# Patient Record
Sex: Female | Born: 1935 | Race: White | Hispanic: No | Smoking: Former smoker
Health system: Southern US, Community
[De-identification: ages and names within clinical notes are randomized; demographics above are authoritative.]

## PROBLEM LIST (undated history)

## (undated) DIAGNOSIS — Z9981 Dependence on supplemental oxygen: Secondary | ICD-10-CM

## (undated) DIAGNOSIS — I219 Acute myocardial infarction, unspecified: Secondary | ICD-10-CM

## (undated) DIAGNOSIS — R05 Cough: Secondary | ICD-10-CM

## (undated) DIAGNOSIS — F329 Major depressive disorder, single episode, unspecified: Secondary | ICD-10-CM

## (undated) DIAGNOSIS — I509 Heart failure, unspecified: Secondary | ICD-10-CM

## (undated) DIAGNOSIS — I2510 Atherosclerotic heart disease of native coronary artery without angina pectoris: Secondary | ICD-10-CM

## (undated) DIAGNOSIS — R011 Cardiac murmur, unspecified: Secondary | ICD-10-CM

## (undated) DIAGNOSIS — M4800 Spinal stenosis, site unspecified: Secondary | ICD-10-CM

## (undated) DIAGNOSIS — F3289 Other specified depressive episodes: Secondary | ICD-10-CM

## (undated) DIAGNOSIS — J4489 Other specified chronic obstructive pulmonary disease: Secondary | ICD-10-CM

## (undated) DIAGNOSIS — M810 Age-related osteoporosis without current pathological fracture: Secondary | ICD-10-CM

## (undated) DIAGNOSIS — R0989 Other specified symptoms and signs involving the circulatory and respiratory systems: Secondary | ICD-10-CM

## (undated) DIAGNOSIS — M1990 Unspecified osteoarthritis, unspecified site: Secondary | ICD-10-CM

## (undated) DIAGNOSIS — I209 Angina pectoris, unspecified: Secondary | ICD-10-CM

## (undated) DIAGNOSIS — R0609 Other forms of dyspnea: Secondary | ICD-10-CM

## (undated) DIAGNOSIS — T464X5A Adverse effect of angiotensin-converting-enzyme inhibitors, initial encounter: Secondary | ICD-10-CM

## (undated) DIAGNOSIS — Z853 Personal history of malignant neoplasm of breast: Secondary | ICD-10-CM

## (undated) DIAGNOSIS — J189 Pneumonia, unspecified organism: Secondary | ICD-10-CM

## (undated) DIAGNOSIS — H9190 Unspecified hearing loss, unspecified ear: Secondary | ICD-10-CM

## (undated) DIAGNOSIS — F419 Anxiety disorder, unspecified: Secondary | ICD-10-CM

## (undated) DIAGNOSIS — R32 Unspecified urinary incontinence: Secondary | ICD-10-CM

## (undated) DIAGNOSIS — J309 Allergic rhinitis, unspecified: Secondary | ICD-10-CM

## (undated) DIAGNOSIS — J449 Chronic obstructive pulmonary disease, unspecified: Secondary | ICD-10-CM

## (undated) DIAGNOSIS — M109 Gout, unspecified: Secondary | ICD-10-CM

## (undated) DIAGNOSIS — R058 Other specified cough: Secondary | ICD-10-CM

## (undated) DIAGNOSIS — I739 Peripheral vascular disease, unspecified: Secondary | ICD-10-CM

## (undated) DIAGNOSIS — I6529 Occlusion and stenosis of unspecified carotid artery: Secondary | ICD-10-CM

## (undated) DIAGNOSIS — E785 Hyperlipidemia, unspecified: Secondary | ICD-10-CM

## (undated) DIAGNOSIS — K219 Gastro-esophageal reflux disease without esophagitis: Secondary | ICD-10-CM

## (undated) DIAGNOSIS — I10 Essential (primary) hypertension: Secondary | ICD-10-CM

## (undated) DIAGNOSIS — C50919 Malignant neoplasm of unspecified site of unspecified female breast: Secondary | ICD-10-CM

## (undated) DIAGNOSIS — S22080A Wedge compression fracture of T11-T12 vertebra, initial encounter for closed fracture: Secondary | ICD-10-CM

## (undated) DIAGNOSIS — G709 Myoneural disorder, unspecified: Secondary | ICD-10-CM

## (undated) DIAGNOSIS — J069 Acute upper respiratory infection, unspecified: Secondary | ICD-10-CM

## (undated) DIAGNOSIS — R0602 Shortness of breath: Secondary | ICD-10-CM

## (undated) HISTORY — DX: Spinal stenosis, site unspecified: M48.00

## (undated) HISTORY — DX: Personal history of malignant neoplasm of breast: Z85.3

## (undated) HISTORY — DX: Unspecified urinary incontinence: R32

## (undated) HISTORY — DX: Essential (primary) hypertension: I10

## (undated) HISTORY — DX: Major depressive disorder, single episode, unspecified: F32.9

## (undated) HISTORY — DX: Allergic rhinitis, unspecified: J30.9

## (undated) HISTORY — DX: Other specified depressive episodes: F32.89

## (undated) HISTORY — DX: Adverse effect of angiotensin-converting-enzyme inhibitors, initial encounter: T46.4X5A

## (undated) HISTORY — PX: FRACTURE SURGERY: SHX138

## (undated) HISTORY — PX: ABDOMINAL HYSTERECTOMY: SHX81

## (undated) HISTORY — DX: Peripheral vascular disease, unspecified: I73.9

## (undated) HISTORY — DX: Other forms of dyspnea: R06.09

## (undated) HISTORY — DX: Age-related osteoporosis without current pathological fracture: M81.0

## (undated) HISTORY — DX: Other specified symptoms and signs involving the circulatory and respiratory systems: R09.89

## (undated) HISTORY — PX: MASTOIDECTOMY: SHX711

## (undated) HISTORY — PX: MASTECTOMY: SHX3

## (undated) HISTORY — DX: Cough: R05

## (undated) HISTORY — DX: Gastro-esophageal reflux disease without esophagitis: K21.9

## (undated) HISTORY — DX: Chronic obstructive pulmonary disease, unspecified: J44.9

## (undated) HISTORY — DX: Wedge compression fracture of t11-T12 vertebra, initial encounter for closed fracture: S22.080A

## (undated) HISTORY — DX: Hyperlipidemia, unspecified: E78.5

## (undated) HISTORY — DX: Other specified cough: R05.8

## (undated) HISTORY — DX: Unspecified osteoarthritis, unspecified site: M19.90

## (undated) HISTORY — DX: Atherosclerotic heart disease of native coronary artery without angina pectoris: I25.10

## (undated) HISTORY — DX: Occlusion and stenosis of unspecified carotid artery: I65.29

## (undated) HISTORY — DX: Malignant neoplasm of unspecified site of unspecified female breast: C50.919

## (undated) HISTORY — DX: Other specified chronic obstructive pulmonary disease: J44.89

## (undated) HISTORY — DX: Unspecified hearing loss, unspecified ear: H91.90

## (undated) HISTORY — DX: Gout, unspecified: M10.9

---
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
## 1985-07-13 HISTORY — PX: MASTECTOMY: SHX3

## 1996-07-13 HISTORY — PX: REDUCTION MAMMAPLASTY: SUR839

## 1996-07-13 HISTORY — PX: BREAST RECONSTRUCTION: SHX9

## 1997-10-27 ENCOUNTER — Emergency Department (HOSPITAL_COMMUNITY): Admission: EM | Admit: 1997-10-27 | Discharge: 1997-10-27 | Payer: Self-pay | Admitting: Emergency Medicine

## 1998-04-16 ENCOUNTER — Ambulatory Visit (HOSPITAL_COMMUNITY): Admission: RE | Admit: 1998-04-16 | Discharge: 1998-04-16 | Payer: Self-pay | Admitting: Internal Medicine

## 1998-04-22 ENCOUNTER — Emergency Department (HOSPITAL_COMMUNITY): Admission: EM | Admit: 1998-04-22 | Discharge: 1998-04-22 | Payer: Self-pay | Admitting: Emergency Medicine

## 1999-03-05 ENCOUNTER — Emergency Department (HOSPITAL_COMMUNITY): Admission: EM | Admit: 1999-03-05 | Discharge: 1999-03-05 | Payer: Self-pay | Admitting: Emergency Medicine

## 1999-03-05 ENCOUNTER — Encounter: Payer: Self-pay | Admitting: Emergency Medicine

## 1999-06-18 ENCOUNTER — Ambulatory Visit (HOSPITAL_COMMUNITY): Admission: RE | Admit: 1999-06-18 | Discharge: 1999-06-18 | Payer: Self-pay | Admitting: Gastroenterology

## 1999-12-07 ENCOUNTER — Emergency Department (HOSPITAL_COMMUNITY): Admission: EM | Admit: 1999-12-07 | Discharge: 1999-12-07 | Payer: Self-pay | Admitting: *Deleted

## 1999-12-07 ENCOUNTER — Encounter: Payer: Self-pay | Admitting: Emergency Medicine

## 2000-12-26 ENCOUNTER — Encounter: Payer: Self-pay | Admitting: Emergency Medicine

## 2000-12-26 ENCOUNTER — Emergency Department (HOSPITAL_COMMUNITY): Admission: EM | Admit: 2000-12-26 | Discharge: 2000-12-26 | Payer: Self-pay | Admitting: Emergency Medicine

## 2001-04-27 ENCOUNTER — Encounter: Payer: Self-pay | Admitting: Emergency Medicine

## 2001-04-27 ENCOUNTER — Emergency Department (HOSPITAL_COMMUNITY): Admission: EM | Admit: 2001-04-27 | Discharge: 2001-04-27 | Payer: Self-pay | Admitting: Emergency Medicine

## 2001-05-28 ENCOUNTER — Encounter: Payer: Self-pay | Admitting: Orthopedic Surgery

## 2001-05-29 ENCOUNTER — Inpatient Hospital Stay (HOSPITAL_COMMUNITY): Admission: RE | Admit: 2001-05-29 | Discharge: 2001-05-30 | Payer: Self-pay | Admitting: Orthopedic Surgery

## 2001-07-13 HISTORY — PX: LUMBAR LAMINECTOMY: SHX95

## 2001-10-11 ENCOUNTER — Ambulatory Visit (HOSPITAL_COMMUNITY): Admission: RE | Admit: 2001-10-11 | Discharge: 2001-10-11 | Payer: Self-pay | Admitting: Orthopedic Surgery

## 2002-01-10 ENCOUNTER — Inpatient Hospital Stay (HOSPITAL_COMMUNITY): Admission: RE | Admit: 2002-01-10 | Discharge: 2002-01-12 | Payer: Self-pay | Admitting: Orthopedic Surgery

## 2002-01-10 ENCOUNTER — Encounter: Payer: Self-pay | Admitting: Orthopedic Surgery

## 2002-01-10 ENCOUNTER — Encounter (INDEPENDENT_AMBULATORY_CARE_PROVIDER_SITE_OTHER): Payer: Self-pay | Admitting: Specialist

## 2002-06-01 ENCOUNTER — Inpatient Hospital Stay (HOSPITAL_COMMUNITY): Admission: RE | Admit: 2002-06-01 | Discharge: 2002-06-04 | Payer: Self-pay | Admitting: Orthopedic Surgery

## 2002-06-01 ENCOUNTER — Encounter: Payer: Self-pay | Admitting: Orthopedic Surgery

## 2002-10-01 ENCOUNTER — Emergency Department (HOSPITAL_COMMUNITY): Admission: EM | Admit: 2002-10-01 | Discharge: 2002-10-02 | Payer: Self-pay | Admitting: Emergency Medicine

## 2003-02-08 ENCOUNTER — Encounter: Admission: RE | Admit: 2003-02-08 | Discharge: 2003-02-08 | Payer: Self-pay | Admitting: Specialist

## 2003-02-08 ENCOUNTER — Encounter: Payer: Self-pay | Admitting: Specialist

## 2003-03-21 ENCOUNTER — Encounter: Payer: Self-pay | Admitting: Specialist

## 2003-03-23 ENCOUNTER — Inpatient Hospital Stay (HOSPITAL_COMMUNITY): Admission: RE | Admit: 2003-03-23 | Discharge: 2003-03-27 | Payer: Self-pay | Admitting: Specialist

## 2003-03-23 ENCOUNTER — Encounter: Payer: Self-pay | Admitting: Specialist

## 2003-03-26 ENCOUNTER — Encounter: Payer: Self-pay | Admitting: Specialist

## 2003-03-27 ENCOUNTER — Inpatient Hospital Stay
Admission: RE | Admit: 2003-03-27 | Discharge: 2003-04-04 | Payer: Self-pay | Admitting: Physical Medicine & Rehabilitation

## 2004-06-12 ENCOUNTER — Ambulatory Visit: Payer: Self-pay | Admitting: Internal Medicine

## 2004-08-13 ENCOUNTER — Ambulatory Visit: Payer: Self-pay | Admitting: General Surgery

## 2004-09-11 ENCOUNTER — Ambulatory Visit: Payer: Self-pay | Admitting: Internal Medicine

## 2004-10-10 ENCOUNTER — Ambulatory Visit: Payer: Self-pay | Admitting: Internal Medicine

## 2004-11-06 ENCOUNTER — Ambulatory Visit: Payer: Self-pay

## 2004-12-11 ENCOUNTER — Ambulatory Visit: Payer: Self-pay | Admitting: Internal Medicine

## 2005-01-01 ENCOUNTER — Ambulatory Visit: Payer: Self-pay | Admitting: Internal Medicine

## 2005-01-08 ENCOUNTER — Ambulatory Visit: Payer: Self-pay | Admitting: Internal Medicine

## 2005-01-09 ENCOUNTER — Ambulatory Visit: Payer: Self-pay | Admitting: Cardiology

## 2005-01-29 ENCOUNTER — Ambulatory Visit: Payer: Self-pay | Admitting: Internal Medicine

## 2005-02-10 ENCOUNTER — Ambulatory Visit: Payer: Self-pay | Admitting: Internal Medicine

## 2005-02-10 ENCOUNTER — Inpatient Hospital Stay (HOSPITAL_COMMUNITY): Admission: EM | Admit: 2005-02-10 | Discharge: 2005-02-13 | Payer: Self-pay | Admitting: Emergency Medicine

## 2005-02-10 HISTORY — PX: SHOULDER SURGERY: SHX246

## 2005-02-11 ENCOUNTER — Encounter: Payer: Self-pay | Admitting: Cardiovascular Disease

## 2005-02-11 ENCOUNTER — Ambulatory Visit: Payer: Self-pay | Admitting: Cardiovascular Disease

## 2005-02-16 ENCOUNTER — Ambulatory Visit (HOSPITAL_COMMUNITY): Admission: RE | Admit: 2005-02-16 | Discharge: 2005-02-16 | Payer: Self-pay | Admitting: Internal Medicine

## 2005-02-27 ENCOUNTER — Ambulatory Visit: Payer: Self-pay | Admitting: Internal Medicine

## 2005-03-23 ENCOUNTER — Ambulatory Visit: Payer: Self-pay | Admitting: Internal Medicine

## 2005-04-09 ENCOUNTER — Ambulatory Visit: Payer: Self-pay | Admitting: Internal Medicine

## 2005-04-17 ENCOUNTER — Ambulatory Visit: Payer: Self-pay | Admitting: Internal Medicine

## 2005-05-08 ENCOUNTER — Ambulatory Visit (HOSPITAL_COMMUNITY): Admission: RE | Admit: 2005-05-08 | Discharge: 2005-05-08 | Payer: Self-pay | Admitting: Neurology

## 2005-07-23 ENCOUNTER — Ambulatory Visit: Payer: Self-pay | Admitting: Internal Medicine

## 2005-08-05 ENCOUNTER — Ambulatory Visit (HOSPITAL_COMMUNITY): Admission: RE | Admit: 2005-08-05 | Discharge: 2005-08-05 | Payer: Self-pay | Admitting: Specialist

## 2005-08-06 ENCOUNTER — Ambulatory Visit: Payer: Self-pay

## 2005-08-19 ENCOUNTER — Ambulatory Visit: Payer: Self-pay | Admitting: General Surgery

## 2005-09-25 ENCOUNTER — Ambulatory Visit: Payer: Self-pay | Admitting: Internal Medicine

## 2005-09-25 ENCOUNTER — Inpatient Hospital Stay (HOSPITAL_COMMUNITY): Admission: RE | Admit: 2005-09-25 | Discharge: 2005-10-02 | Payer: Self-pay | Admitting: Specialist

## 2005-10-06 ENCOUNTER — Ambulatory Visit: Payer: Self-pay | Admitting: Internal Medicine

## 2005-10-09 ENCOUNTER — Ambulatory Visit: Payer: Self-pay | Admitting: *Deleted

## 2005-10-28 ENCOUNTER — Ambulatory Visit: Payer: Self-pay | Admitting: *Deleted

## 2005-11-02 ENCOUNTER — Ambulatory Visit: Payer: Self-pay | Admitting: *Deleted

## 2005-11-06 ENCOUNTER — Ambulatory Visit: Payer: Self-pay | Admitting: *Deleted

## 2005-11-11 ENCOUNTER — Ambulatory Visit: Payer: Self-pay | Admitting: Internal Medicine

## 2005-11-11 ENCOUNTER — Encounter: Payer: Self-pay | Admitting: Emergency Medicine

## 2005-11-23 ENCOUNTER — Ambulatory Visit: Payer: Self-pay | Admitting: Internal Medicine

## 2005-12-02 ENCOUNTER — Ambulatory Visit: Payer: Self-pay | Admitting: Internal Medicine

## 2006-01-28 ENCOUNTER — Ambulatory Visit: Payer: Self-pay | Admitting: Internal Medicine

## 2006-03-04 ENCOUNTER — Ambulatory Visit: Payer: Self-pay | Admitting: Internal Medicine

## 2006-06-02 ENCOUNTER — Ambulatory Visit: Payer: Self-pay | Admitting: Internal Medicine

## 2006-06-14 ENCOUNTER — Ambulatory Visit: Payer: Self-pay | Admitting: Family Medicine

## 2006-08-23 ENCOUNTER — Ambulatory Visit: Payer: Self-pay | Admitting: General Surgery

## 2006-09-29 ENCOUNTER — Ambulatory Visit: Payer: Self-pay | Admitting: Internal Medicine

## 2006-09-29 ENCOUNTER — Ambulatory Visit: Payer: Self-pay

## 2006-10-30 DIAGNOSIS — R32 Unspecified urinary incontinence: Secondary | ICD-10-CM

## 2006-10-30 DIAGNOSIS — E785 Hyperlipidemia, unspecified: Secondary | ICD-10-CM

## 2006-10-30 DIAGNOSIS — J449 Chronic obstructive pulmonary disease, unspecified: Secondary | ICD-10-CM

## 2006-10-30 DIAGNOSIS — I10 Essential (primary) hypertension: Secondary | ICD-10-CM | POA: Insufficient documentation

## 2006-10-30 DIAGNOSIS — M810 Age-related osteoporosis without current pathological fracture: Secondary | ICD-10-CM | POA: Insufficient documentation

## 2006-10-30 DIAGNOSIS — M1990 Unspecified osteoarthritis, unspecified site: Secondary | ICD-10-CM | POA: Insufficient documentation

## 2006-10-30 DIAGNOSIS — I2510 Atherosclerotic heart disease of native coronary artery without angina pectoris: Secondary | ICD-10-CM | POA: Insufficient documentation

## 2006-10-30 DIAGNOSIS — K219 Gastro-esophageal reflux disease without esophagitis: Secondary | ICD-10-CM

## 2006-10-30 DIAGNOSIS — F39 Unspecified mood [affective] disorder: Secondary | ICD-10-CM

## 2006-11-05 ENCOUNTER — Telehealth (INDEPENDENT_AMBULATORY_CARE_PROVIDER_SITE_OTHER): Payer: Self-pay | Admitting: *Deleted

## 2006-11-19 ENCOUNTER — Encounter: Payer: Self-pay | Admitting: Internal Medicine

## 2006-12-03 ENCOUNTER — Encounter (INDEPENDENT_AMBULATORY_CARE_PROVIDER_SITE_OTHER): Payer: Self-pay | Admitting: *Deleted

## 2007-01-17 ENCOUNTER — Telehealth: Payer: Self-pay | Admitting: Internal Medicine

## 2007-01-18 ENCOUNTER — Ambulatory Visit: Payer: Self-pay | Admitting: Internal Medicine

## 2007-01-21 ENCOUNTER — Telehealth (INDEPENDENT_AMBULATORY_CARE_PROVIDER_SITE_OTHER): Payer: Self-pay | Admitting: *Deleted

## 2007-01-21 ENCOUNTER — Ambulatory Visit: Payer: Self-pay | Admitting: Internal Medicine

## 2007-01-24 ENCOUNTER — Telehealth (INDEPENDENT_AMBULATORY_CARE_PROVIDER_SITE_OTHER): Payer: Self-pay | Admitting: *Deleted

## 2007-04-01 ENCOUNTER — Ambulatory Visit: Payer: Self-pay | Admitting: Internal Medicine

## 2007-04-04 LAB — CONVERTED CEMR LAB
CO2: 29 meq/L (ref 19–32)
Chloride: 106 meq/L (ref 96–112)
Creatinine, Ser: 0.9 mg/dL (ref 0.4–1.2)
Sodium: 142 meq/L (ref 135–145)

## 2007-04-07 ENCOUNTER — Encounter: Payer: Self-pay | Admitting: Internal Medicine

## 2007-05-13 ENCOUNTER — Ambulatory Visit: Payer: Self-pay | Admitting: Internal Medicine

## 2007-05-16 LAB — CONVERTED CEMR LAB
Cholesterol: 206 mg/dL (ref 0–200)
Glucose, Bld: 107 mg/dL — ABNORMAL HIGH (ref 70–99)
HDL: 70.7 mg/dL (ref >39.0)

## 2007-05-27 ENCOUNTER — Ambulatory Visit: Payer: Self-pay | Admitting: Internal Medicine

## 2007-08-15 ENCOUNTER — Ambulatory Visit: Payer: Self-pay | Admitting: General Surgery

## 2007-09-06 ENCOUNTER — Encounter: Payer: Self-pay | Admitting: Internal Medicine

## 2007-09-27 ENCOUNTER — Encounter: Payer: Self-pay | Admitting: Internal Medicine

## 2007-09-27 ENCOUNTER — Ambulatory Visit: Payer: Self-pay

## 2007-11-04 ENCOUNTER — Ambulatory Visit: Payer: Self-pay | Admitting: Internal Medicine

## 2007-11-04 DIAGNOSIS — H9190 Unspecified hearing loss, unspecified ear: Secondary | ICD-10-CM | POA: Insufficient documentation

## 2007-11-07 LAB — CONVERTED CEMR LAB
Albumin: 4.1 g/dL (ref 3.5–5.2)
Alkaline Phosphatase: 55 units/L (ref 39–117)
BUN: 24 mg/dL — ABNORMAL HIGH (ref 6–23)
Basophils Relative: 0.1 % (ref 0.0–1.0)
Calcium: 9.2 mg/dL (ref 8.4–10.5)
Chloride: 103 meq/L (ref 96–112)
Cholesterol: 237 mg/dL (ref 0–200)
GFR calc non Af Amer: 74 mL/min
HDL: 68.9 mg/dL (ref >39.0)
Hemoglobin: 13.1 g/dL (ref 12.0–15.0)
Lymphocytes Relative: 19.4 % (ref 12.0–46.0)
MCHC: 34.2 g/dL (ref 30.0–36.0)
MCV: 92.7 fL (ref 78.0–100.0)
Monocytes Absolute: 0.7 10*3/uL (ref 0.1–1.0)
Platelets: 178 10*3/uL (ref 150–400)
RBC: 4.12 M/uL (ref 3.87–5.11)
Sodium: 140 meq/L (ref 135–145)
Total Bilirubin: 0.8 mg/dL (ref 0.3–1.2)
Total CHOL/HDL Ratio: 3.4
Triglycerides: 164 mg/dL — ABNORMAL HIGH (ref 0–149)
VLDL: 33 mg/dL (ref 0–40)
WBC: 5.7 10*3/uL (ref 4.5–10.5)

## 2007-11-17 ENCOUNTER — Ambulatory Visit (HOSPITAL_BASED_OUTPATIENT_CLINIC_OR_DEPARTMENT_OTHER): Admission: RE | Admit: 2007-11-17 | Discharge: 2007-11-17 | Payer: Self-pay | Admitting: Specialist

## 2008-01-10 ENCOUNTER — Encounter: Payer: Self-pay | Admitting: Internal Medicine

## 2008-04-16 ENCOUNTER — Ambulatory Visit: Payer: Self-pay | Admitting: Internal Medicine

## 2008-05-24 ENCOUNTER — Ambulatory Visit: Payer: Self-pay | Admitting: Family Medicine

## 2008-06-04 ENCOUNTER — Telehealth: Payer: Self-pay | Admitting: Family Medicine

## 2008-06-21 ENCOUNTER — Telehealth: Payer: Self-pay | Admitting: Internal Medicine

## 2008-07-11 ENCOUNTER — Encounter: Payer: Self-pay | Admitting: Internal Medicine

## 2008-07-18 ENCOUNTER — Encounter: Admission: RE | Admit: 2008-07-18 | Discharge: 2008-07-18 | Payer: Self-pay | Admitting: Gastroenterology

## 2008-08-15 ENCOUNTER — Ambulatory Visit: Payer: Self-pay | Admitting: General Surgery

## 2008-08-23 ENCOUNTER — Encounter: Payer: Self-pay | Admitting: Internal Medicine

## 2008-09-04 ENCOUNTER — Telehealth: Payer: Self-pay | Admitting: Internal Medicine

## 2008-09-04 ENCOUNTER — Ambulatory Visit: Payer: Self-pay | Admitting: Internal Medicine

## 2008-09-11 ENCOUNTER — Telehealth: Payer: Self-pay | Admitting: Internal Medicine

## 2008-09-17 ENCOUNTER — Telehealth: Payer: Self-pay | Admitting: Internal Medicine

## 2008-10-02 ENCOUNTER — Ambulatory Visit: Payer: Self-pay

## 2008-10-18 ENCOUNTER — Ambulatory Visit: Payer: Self-pay | Admitting: Internal Medicine

## 2008-10-22 LAB — CONVERTED CEMR LAB
Albumin: 4.2 g/dL (ref 3.5–5.2)
BUN: 24 mg/dL — ABNORMAL HIGH (ref 6–23)
Basophils Relative: 1.4 % (ref 0.0–3.0)
Bilirubin, Direct: 0.1 mg/dL (ref 0.0–0.3)
CO2: 27 meq/L (ref 19–32)
Chloride: 104 meq/L (ref 96–112)
Cholesterol: 207 mg/dL — ABNORMAL HIGH (ref 0–200)
GFR calc non Af Amer: 57.05 mL/min (ref >60)
HDL: 65.4 mg/dL (ref >39.00)
MCHC: 35.1 g/dL (ref 30.0–36.0)
Monocytes Relative: 8.7 % (ref 3.0–12.0)
Neutro Abs: 3.9 10*3/uL (ref 1.4–7.7)
Platelets: 194 10*3/uL (ref 150.0–400.0)
Potassium: 4.4 meq/L (ref 3.5–5.1)
RBC: 3.79 M/uL — ABNORMAL LOW (ref 3.87–5.11)
Sodium: 139 meq/L (ref 135–145)
Total Bilirubin: 0.7 mg/dL (ref 0.3–1.2)
Total CHOL/HDL Ratio: 3
Total Protein: 7 g/dL (ref 6.0–8.3)
Triglycerides: 153 mg/dL — ABNORMAL HIGH (ref 0.0–149.0)

## 2009-03-14 ENCOUNTER — Telehealth: Payer: Self-pay | Admitting: Internal Medicine

## 2009-04-15 ENCOUNTER — Encounter: Payer: Self-pay | Admitting: Cardiology

## 2009-04-15 ENCOUNTER — Encounter: Payer: Self-pay | Admitting: Internal Medicine

## 2009-04-16 ENCOUNTER — Ambulatory Visit: Payer: Self-pay | Admitting: Internal Medicine

## 2009-04-17 LAB — CONVERTED CEMR LAB
AST: 25 units/L (ref 0–37)
Albumin: 4.5 g/dL (ref 3.5–5.2)
BUN: 19 mg/dL (ref 6–23)
Basophils Absolute: 0 10*3/uL (ref 0.0–0.1)
Bilirubin, Direct: 0 mg/dL (ref 0.0–0.3)
CO2: 29 meq/L (ref 19–32)
Calcium: 9.2 mg/dL (ref 8.4–10.5)
Chloride: 104 meq/L (ref 96–112)
Creatinine, Ser: 0.8 mg/dL (ref 0.4–1.2)
Direct LDL: 121.4 mg/dL
GFR calc non Af Amer: 73.72 mL/min (ref >60)
Glucose, Bld: 99 mg/dL (ref 70–99)
Lymphocytes Relative: 18.6 % (ref 12.0–46.0)
MCHC: 34.6 g/dL (ref 30.0–36.0)
Monocytes Absolute: 0.7 10*3/uL (ref 0.1–1.0)
Neutro Abs: 4.1 10*3/uL (ref 1.4–7.7)
Phosphorus: 3.1 mg/dL (ref 2.3–4.6)
Platelets: 190 10*3/uL (ref 150.0–400.0)
Potassium: 4.1 meq/L (ref 3.5–5.1)
RDW: 13.9 % (ref 11.5–14.6)
TSH: 0.68 microintl units/mL (ref 0.35–5.50)
Total Bilirubin: 0.8 mg/dL (ref 0.3–1.2)
Total Protein: 8.2 g/dL (ref 6.0–8.3)
WBC: 6 10*3/uL (ref 4.5–10.5)

## 2009-04-30 ENCOUNTER — Encounter: Payer: Self-pay | Admitting: Internal Medicine

## 2009-05-14 ENCOUNTER — Ambulatory Visit: Payer: Self-pay | Admitting: Internal Medicine

## 2009-05-14 DIAGNOSIS — R0609 Other forms of dyspnea: Secondary | ICD-10-CM

## 2009-05-14 DIAGNOSIS — R0989 Other specified symptoms and signs involving the circulatory and respiratory systems: Secondary | ICD-10-CM

## 2009-05-16 ENCOUNTER — Ambulatory Visit: Payer: Self-pay | Admitting: Ophthalmology

## 2009-05-16 ENCOUNTER — Ambulatory Visit: Payer: Self-pay | Admitting: Cardiology

## 2009-05-27 ENCOUNTER — Ambulatory Visit: Payer: Self-pay | Admitting: Ophthalmology

## 2009-06-07 ENCOUNTER — Ambulatory Visit: Payer: Self-pay | Admitting: Family Medicine

## 2009-06-11 ENCOUNTER — Encounter: Payer: Self-pay | Admitting: Cardiology

## 2009-06-11 ENCOUNTER — Encounter: Admission: RE | Admit: 2009-06-11 | Discharge: 2009-06-11 | Payer: Self-pay | Admitting: Plastic Surgery

## 2009-06-12 ENCOUNTER — Ambulatory Visit (HOSPITAL_BASED_OUTPATIENT_CLINIC_OR_DEPARTMENT_OTHER): Admission: RE | Admit: 2009-06-12 | Discharge: 2009-06-12 | Payer: Self-pay | Admitting: Plastic Surgery

## 2009-06-12 HISTORY — PX: BREAST IMPLANT REMOVAL: SUR1101

## 2009-06-16 ENCOUNTER — Observation Stay (HOSPITAL_COMMUNITY): Admission: EM | Admit: 2009-06-16 | Discharge: 2009-06-16 | Payer: Self-pay | Admitting: Emergency Medicine

## 2009-06-18 ENCOUNTER — Encounter: Payer: Self-pay | Admitting: Internal Medicine

## 2009-06-18 ENCOUNTER — Ambulatory Visit: Payer: Self-pay | Admitting: Critical Care Medicine

## 2009-06-18 ENCOUNTER — Inpatient Hospital Stay (HOSPITAL_COMMUNITY): Admission: EM | Admit: 2009-06-18 | Discharge: 2009-06-24 | Payer: Self-pay | Admitting: Emergency Medicine

## 2009-06-20 ENCOUNTER — Encounter: Payer: Self-pay | Admitting: Cardiology

## 2009-06-21 ENCOUNTER — Telehealth: Payer: Self-pay | Admitting: Internal Medicine

## 2009-07-02 ENCOUNTER — Ambulatory Visit: Payer: Self-pay | Admitting: Internal Medicine

## 2009-07-15 ENCOUNTER — Telehealth: Payer: Self-pay | Admitting: Family Medicine

## 2009-07-16 ENCOUNTER — Ambulatory Visit: Payer: Self-pay | Admitting: Family Medicine

## 2009-07-17 LAB — CONVERTED CEMR LAB
BUN: 13 mg/dL (ref 6–23)
Basophils Absolute: 0.2 10*3/uL — ABNORMAL HIGH (ref 0.0–0.1)
Basophils Relative: 3.2 % — ABNORMAL HIGH (ref 0.0–3.0)
Calcium: 9.1 mg/dL (ref 8.4–10.5)
Chloride: 102 meq/L (ref 96–112)
Glucose, Bld: 113 mg/dL — ABNORMAL HIGH (ref 70–99)
Hemoglobin: 12.8 g/dL (ref 12.0–15.0)
Lymphs Abs: 0.8 10*3/uL (ref 0.7–4.0)
MCHC: 31.8 g/dL (ref 30.0–36.0)
MCV: 100.7 fL — ABNORMAL HIGH (ref 78.0–100.0)
Neutrophils Relative %: 74 % (ref 43.0–77.0)
Platelets: 323 10*3/uL (ref 150.0–400.0)
RBC: 3.99 M/uL (ref 3.87–5.11)
WBC: 5.7 10*3/uL (ref 4.5–10.5)

## 2009-07-19 ENCOUNTER — Ambulatory Visit: Payer: Self-pay | Admitting: Cardiology

## 2009-07-19 DIAGNOSIS — I6529 Occlusion and stenosis of unspecified carotid artery: Secondary | ICD-10-CM | POA: Insufficient documentation

## 2009-07-22 LAB — CONVERTED CEMR LAB: Pro B Natriuretic peptide (BNP): 35.5 pg/mL (ref 0.0–100.0)

## 2009-07-25 ENCOUNTER — Encounter: Payer: Self-pay | Admitting: Cardiology

## 2009-07-25 ENCOUNTER — Ambulatory Visit: Payer: Self-pay

## 2009-07-30 ENCOUNTER — Ambulatory Visit: Payer: Self-pay | Admitting: Cardiology

## 2009-08-06 ENCOUNTER — Ambulatory Visit: Payer: Self-pay | Admitting: Internal Medicine

## 2009-08-09 ENCOUNTER — Ambulatory Visit: Payer: Self-pay | Admitting: Cardiology

## 2009-08-19 ENCOUNTER — Ambulatory Visit: Payer: Self-pay | Admitting: General Surgery

## 2009-09-03 ENCOUNTER — Encounter: Payer: Self-pay | Admitting: Internal Medicine

## 2009-09-03 ENCOUNTER — Ambulatory Visit: Payer: Self-pay | Admitting: Family Medicine

## 2009-09-06 ENCOUNTER — Ambulatory Visit: Payer: Self-pay | Admitting: Family Medicine

## 2009-09-13 ENCOUNTER — Ambulatory Visit: Payer: Self-pay | Admitting: Internal Medicine

## 2009-09-19 LAB — CONVERTED CEMR LAB
ALT: 29 units/L (ref 0–35)
AST: 37 units/L (ref 0–37)
Alkaline Phosphatase: 66 units/L (ref 39–117)
BUN: 18 mg/dL (ref 6–23)
Bilirubin, Direct: 0.1 mg/dL (ref 0.0–0.3)
CO2: 26 meq/L (ref 19–32)
Calcium: 9.1 mg/dL (ref 8.4–10.5)
Cholesterol: 164 mg/dL (ref 0–200)
GFR calc non Af Amer: 56.92 mL/min (ref >60)
Glucose, Bld: 96 mg/dL (ref 70–99)
HDL: 84.5 mg/dL (ref >39.00)
Phosphorus: 3.8 mg/dL (ref 2.3–4.6)
Total CHOL/HDL Ratio: 2
VLDL: 17.4 mg/dL (ref 0.0–40.0)

## 2009-10-17 ENCOUNTER — Ambulatory Visit: Payer: Self-pay

## 2009-10-17 ENCOUNTER — Encounter: Payer: Self-pay | Admitting: Cardiology

## 2009-10-18 ENCOUNTER — Ambulatory Visit: Payer: Self-pay | Admitting: Internal Medicine

## 2009-10-21 LAB — CONVERTED CEMR LAB
CO2: 31 meq/L (ref 19–32)
Chloride: 97 meq/L (ref 96–112)
Phosphorus: 4.5 mg/dL (ref 2.3–4.6)
Potassium: 3.5 meq/L (ref 3.5–5.1)

## 2009-10-23 ENCOUNTER — Ambulatory Visit: Payer: Self-pay | Admitting: Internal Medicine

## 2009-10-28 ENCOUNTER — Telehealth: Payer: Self-pay | Admitting: Cardiology

## 2009-11-12 ENCOUNTER — Telehealth (INDEPENDENT_AMBULATORY_CARE_PROVIDER_SITE_OTHER): Payer: Self-pay | Admitting: *Deleted

## 2009-11-13 ENCOUNTER — Encounter (HOSPITAL_COMMUNITY): Admission: RE | Admit: 2009-11-13 | Discharge: 2010-01-10 | Payer: Self-pay | Admitting: Cardiology

## 2009-11-13 ENCOUNTER — Ambulatory Visit: Payer: Self-pay | Admitting: Cardiovascular Disease

## 2009-11-13 ENCOUNTER — Ambulatory Visit: Payer: Self-pay

## 2009-11-21 ENCOUNTER — Telehealth: Payer: Self-pay | Admitting: Internal Medicine

## 2009-11-29 ENCOUNTER — Telehealth: Payer: Self-pay | Admitting: Internal Medicine

## 2009-12-02 ENCOUNTER — Encounter: Payer: Self-pay | Admitting: Internal Medicine

## 2009-12-17 ENCOUNTER — Telehealth: Payer: Self-pay | Admitting: Internal Medicine

## 2009-12-18 ENCOUNTER — Ambulatory Visit: Payer: Self-pay | Admitting: Internal Medicine

## 2009-12-18 DIAGNOSIS — J301 Allergic rhinitis due to pollen: Secondary | ICD-10-CM | POA: Insufficient documentation

## 2010-03-14 ENCOUNTER — Ambulatory Visit: Payer: Self-pay | Admitting: Internal Medicine

## 2010-04-29 ENCOUNTER — Encounter: Payer: Self-pay | Admitting: Internal Medicine

## 2010-04-29 ENCOUNTER — Telehealth: Payer: Self-pay | Admitting: Family Medicine

## 2010-05-02 ENCOUNTER — Ambulatory Visit: Payer: Self-pay | Admitting: Internal Medicine

## 2010-05-28 ENCOUNTER — Ambulatory Visit: Payer: Self-pay | Admitting: Internal Medicine

## 2010-06-02 LAB — CONVERTED CEMR LAB
ALT: 15 units/L (ref 0–35)
Albumin: 4.4 g/dL (ref 3.5–5.2)
Alkaline Phosphatase: 46 units/L (ref 39–117)
BUN: 22 mg/dL (ref 6–23)
Basophils Absolute: 0 10*3/uL (ref 0.0–0.1)
Basophils Relative: 0.4 % (ref 0.0–3.0)
CO2: 28 meq/L (ref 19–32)
Creatinine, Ser: 0.9 mg/dL (ref 0.4–1.2)
GFR calc non Af Amer: 66.72 mL/min (ref >60)
Glucose, Bld: 98 mg/dL (ref 70–99)
Lymphs Abs: 1.2 10*3/uL (ref 0.7–4.0)
MCV: 98.9 fL (ref 78.0–100.0)
Monocytes Absolute: 0.7 10*3/uL (ref 0.1–1.0)
Monocytes Relative: 14.4 % — ABNORMAL HIGH (ref 3.0–12.0)
Neutro Abs: 3 10*3/uL (ref 1.4–7.7)
Phosphorus: 5.1 mg/dL — ABNORMAL HIGH (ref 2.3–4.6)
TSH: 1.3 microintl units/mL (ref 0.35–5.50)
Total Bilirubin: 1.1 mg/dL (ref 0.3–1.2)
WBC: 5 10*3/uL (ref 4.5–10.5)

## 2010-08-14 NOTE — Assessment & Plan Note (Signed)
Summary: Feet and ankles swelling, SOB, feels sleepy and tired /lsf   Vital Signs:  Patient profile:   77 year old female Weight:      166.75 pounds BMI:     26.21 O2 Sat:      98 % on Room air Temp:     98.4 degrees F oral Pulse rate:   104 / minute Pulse rhythm:   regular BP sitting:   132 / 84  (left arm) Cuff size:   regular  Vitals Entered By: Linde Gillis CMA [facility]) (July 16, 2009 2:40 PM)  O2 Flow:  Room air CC: feet and ankle swelling, SOB, feels sleepy and tired   History of Present Illness: Haley Harris is a 77 y/o caucasian female who presents today with her daughter for swelling in her feet and ankles, shortness of breath, scratchy cough, and lethargy for about 1 month.  She was in the hospital about one month ago and developed an anaphylactic rxn to PCN for which she was intubated.  Since her hospital visit she has felt ill.  Dr. Alphonsus Sias has since prescribed her Lasix which she has taken more than anticipated and has not been able to reduce the edema.  She struggles with shortness of breath and needs several minutes to regain her breath after walking moderate distances (ex. car into church).  She also uses 2 pillows to prop herself up to sleep.  Because of the edema, patient has gained about 5 lbs. These complaints are in general since her hospitalization. She  states she has had several nuclear heart scans and ECGs all of which have been normal.  She notes that most foods have a bitter/metalic taste now.  Finally, she questioned if low potassium could cause lethargy becasue she tends to have low potassium.  Problems Prior to Update: 1)  Acute Sinusitis, Unspecified  (ICD-461.9) 2)  Dyspnea/shortness of Breath  (ICD-786.09) 3)  Anxiety, Situational  (ICD-308.3) 4)  Chest Pain  (ICD-786.50) 5)  Hearing Loss  (ICD-389.9) 6)  Peripheral Vascular Disease  (ICD-443.9) 7)  Breast Cancer, Hx of  (ICD-V10.3) 8)  Allergic Reaction, Acute  (ICD-995.3) 9)  Neoplasm, Malignant,  Breast  (ICD-174.9) 10)  Atherosclerotic Cardiovascular Disease  (ICD-429.2) 11)  Urinary Incontinence  (ICD-788.30) 12)  Osteoporosis  (ICD-733.00) 13)  Osteoarthritis  (ICD-715.90) 14)  Hypertension  (ICD-401.9) 15)  Hyperlipidemia  (ICD-272.4) 16)  Gerd  (ICD-530.81) 17)  Depression  (ICD-311) 18)  COPD  (ICD-496)  Medications Prior to Update: 1)  Boniva   Tabs (Ibandronate Sodium Tabs) .... Monthly 2)  Procardia Xl 60 Mg  Tb24 (Nifedipine) .... Take 1 Tablet By Mouth Once A Day 3)  Oscal 500/200 D-3 500-200 Mg-Unit  Tabs (Calcium-Vitamin D) .... 3 Daily 4)  Multivitamins   Tabs (Multiple Vitamin) .... Take 1 Tablet By Mouth Once A Day 5)  Vicodin   Tabs (Hydrocodone-Acetaminophen Tabs) .... Take 1 Tablet By Mouth Two Times A Day Prn 6)  Adult Aspirin Low Strength 81 Mg  Tbdp (Aspirin) .... Take 1 Tablet By Mouth Once A Day 7)  Omeprazole 20 Mg  Cpdr (Omeprazole) .Marland Kitchen.. 1 Two Times A Day 8)  Pravastatin Sodium 40 Mg  Tabs (Pravastatin Sodium) .Marland Kitchen.. 1 Daily 9)  Zyrtec Allergy 10 Mg Tabs (Cetirizine Hcl) .... Take 1 Tablet By Mouth Once Daily 10)  Naprelan 375 Mg Xr24h-Tab (Naproxen Sodium) .... Take 1 By Mouth Two Times A Day As Needed 11)  Fluoxetine Hcl 20 Mg Tabs (Fluoxetine Hcl) .Marland KitchenMarland KitchenMarland Kitchen  1 Tab Daily 12)  Furosemide 40 Mg Tabs (Furosemide) .Marland Kitchen.. 1 Tab Daily As Needed For Foot and Leg Swelling  Allergies: 1)  ! Keflex 2)  ! Biaxin 3)  ! Tramadol Hcl 4)  ! * Ace in Hibitors 5)  ! Keflex (Cephalexin) 6)  ! Biaxin (Clarithromycin) 7)  ! Nifedipine (Nifedipine) 8)  ! Ace Inhibitors 9)  ! Benicar (Olmesartan Medoxomil) 10)  ! Amoxicillin (Amoxicillin)  Physical Exam  General:  Well-developed,well-nourished,in no acute distress; alert,appropriate and cooperative throughout examination Head:  Normocephalic and atraumatic without obvious abnormalities. No apparent alopecia or balding. Eyes:  Conjunctiva clear bilaterally Ears:  External ear exam shows no significant lesions or  deformities.  Otoscopic examination reveals clear canals, tympanic membranes are intact bilaterally without bulging, retraction, inflammation or discharge. Hearing is grossly normal bilaterally. Nose:  External nasal examination shows no deformity or inflammation. Nasal mucosa are pink and moist without lesions or exudates. Mouth:  Oral mucosa and oropharynx without lesions or exudates.  Teeth in good repair. Neck:  supple, no masses, no thyromegaly, and no cervical lymphadenopathy.   Lungs:  Normal respiratory effort, chest expands symmetrically. Lungs are clear to auscultation, no crackles or wheezes. Heart:  Normal rate and regular rhythm. S1 and S2 normal without gallop, murmur, click, rub or other extra sounds. Extremities:  1+ pitting edema in both lower extremities to mid shins. Neurologic:  No cranial nerve deficits noted. Station and gait are normal.  Sensory, motor and coordinative functions appear intact.   Impression & Recommendations:  Problem # 1:  MALAISE AND FATIGUE (ICD-780.79) Assessment New Basically since intubation in the hospital. Will refer to Cardiology for eval. Orders: TLB-BMP (Basic Metabolic Panel-BMET) (80048-METABOL) TLB-CBC Platelet - w/Differential (85025-CBCD) TLB-TSH (Thyroid Stimulating Hormone) (04540-JWJ) Cardiology Referral (Cardiology)  Problem # 2:  DYSPNEA/SHORTNESS OF BREATH (ICD-786.09) Assessment: Unchanged Seems and sounds stable since discharge. Certainly not acute on presentation today. Her updated medication list for this problem includes:    Furosemide 40 Mg Tabs (Furosemide) .Marland Kitchen... 1 tab daily as needed for foot and leg swelling  Problem # 3:  DEPENDENT EDEMA, LEGS (ICD-782.3) Assessment: Unchanged  Seems mild but there. Disussed conservative measures ans suggested she institute all of them. Take Lasix sparingly at this point. Will check electrolytes and kidney function. Cardiology referral to assess heart function. Her updated  medication list for this problem includes:    Furosemide 40 Mg Tabs (Furosemide) .Marland Kitchen... 1 tab daily as needed for foot and leg swelling  Discussed elevation of the legs, use of compression stockings, sodium restiction, and medication use.   Problem # 4:  HYPERTENSION (ICD-401.9) Assessment: Unchanged Adequate. Her updated medication list for this problem includes:    Procardia Xl 60 Mg Tb24 (Nifedipine) .Marland Kitchen... Take 1 tablet by mouth once a day    Furosemide 40 Mg Tabs (Furosemide) .Marland Kitchen... 1 tab daily as needed for foot and leg swelling  Orders: Cardiology Referral (Cardiology)  BP today: 132/84 Prior BP: 110/60 (07/02/2009)  Labs Reviewed: K+: 4.1 (04/16/2009) Creat: : 0.8 (04/16/2009)   Chol: 210 (04/16/2009)   HDL: 74.40 (04/16/2009)   LDL: DEL (11/04/2007)   TG: 149.0 (04/16/2009)  Complete Medication List: 1)  Boniva Tabs (Ibandronate sodium tabs) .... Monthly 2)  Procardia Xl 60 Mg Tb24 (Nifedipine) .... Take 1 tablet by mouth once a day 3)  Oscal 500/200 D-3 500-200 Mg-unit Tabs (Calcium-vitamin d) .... 3 daily 4)  Multivitamins Tabs (Multiple vitamin) .... Take 1 tablet by mouth once a day 5)  Vicodin Tabs (Hydrocodone-acetaminophen tabs) .... Take 1 tablet by mouth two times a day prn 6)  Adult Aspirin Low Strength 81 Mg Tbdp (Aspirin) .... Take 1 tablet by mouth once a day 7)  Omeprazole 20 Mg Cpdr (Omeprazole) .Marland Kitchen.. 1 two times a day 8)  Pravastatin Sodium 40 Mg Tabs (Pravastatin sodium) .Marland Kitchen.. 1 daily 9)  Naprelan 375 Mg Xr24h-tab (Naproxen sodium) .... Take 1 by mouth two times a day as needed 10)  Fluoxetine Hcl 20 Mg Tabs (Fluoxetine hcl) .Marland Kitchen.. 1 tab daily 11)  Furosemide 40 Mg Tabs (Furosemide) .Marland Kitchen.. 1 tab daily as needed for foot and leg swelling  Patient Instructions: 1)  Refer to Cardiology  Current Allergies (reviewed today): ! [facility] ! BIAXIN ! TRAMADOL HCL ! * ACE IN HIBITORS ! KEFLEX (CEPHALEXIN) ! BIAXIN (CLARITHROMYCIN) ! NIFEDIPINE (NIFEDIPINE) ! ACE  INHIBITORS ! BENICAR (OLMESARTAN MEDOXOMIL) ! AMOXICILLIN (AMOXICILLIN)

## 2010-08-14 NOTE — Assessment & Plan Note (Signed)
Summary: rash on leg is spreading/ alc   Vital Signs:  Patient profile:   77 year old female Height:      67 inches Weight:      168.0 pounds BMI:     26.41 Temp:     98.7 degrees F oral Pulse rate:   80 / minute Pulse rhythm:   regular BP sitting:   130 / 82  (left arm) Cuff size:   regular  Vitals Entered By: Benny Lennert CMA [facility]) (September 06, 2009 3:34 PM)  History of Present Illness: Chief complaint rash on legs spreading  Seen on 2/22 for rash on right leg..felt due to dermatitis. Last OV note reviewed. Has history of chronic peripheral edema, wear compression hose.  Told to take lasix daily, topical steroid: triamcinolone.   Now has increase in redness up right leg and streaking downfoot. Now rash on left foot.  Increase in warmth. Very itchy. Some oozing, small blisters.  Feels well otherwise, no fever, no N/V  Had noted swelling originally due to increase in procardia...she has now decrease back to previous dose on her own.  Problems Prior to Update: 1)  Dermatitis, Stasis  (ICD-454.1) 2)  Carotid Artery Stenosis  (ICD-433.10) 3)  Personal History Pneumonia Recurrent  (ICD-V12.61) 4)  Shortness of Breath  (ICD-786.05) 5)  Dependent Edema, Legs  (ICD-782.3) 6)  Malaise and Fatigue  (ICD-780.79) 7)  Acute Sinusitis, Unspecified  (ICD-461.9) 8)  Dyspnea/shortness of Breath  (ICD-786.09) 9)  Anxiety, Situational  (ICD-308.3) 10)  Chest Pain  (ICD-786.50) 11)  Hearing Loss  (ICD-389.9) 12)  Peripheral Vascular Disease  (ICD-443.9) 13)  Breast Cancer, Hx of  (ICD-V10.3) 14)  Allergic Reaction, Acute  (ICD-995.3) 15)  Neoplasm, Malignant, Breast  (ICD-174.9) 16)  Atherosclerotic Cardiovascular Disease  (ICD-429.2) 17)  Urinary Incontinence  (ICD-788.30) 18)  Osteoporosis  (ICD-733.00) 19)  Osteoarthritis  (ICD-715.90) 20)  Hypertension  (ICD-401.9) 21)  Hyperlipidemia  (ICD-272.4) 22)  Gerd  (ICD-530.81) 23)  Depression  (ICD-311) 24)  COPD   (ICD-496)  Current Medications (verified): 1)  Boniva   Tabs (Ibandronate Sodium Tabs) .... Monthly 2)  Procardia Xl 90 Mg Xr24h-Tab (Nifedipine) .Marland Kitchen.. 1 Tab By Mouth Daily 3)  Oscal 500/200 D-3 500-200 Mg-Unit  Tabs (Calcium-Vitamin D) .... 2 Daily 4)  Multivitamins   Tabs (Multiple Vitamin) .... Take 1 Tablet By Mouth Once A Day 5)  Vicodin   Tabs (Hydrocodone-Acetaminophen Tabs) .... Take 1 Tablet By Mouth Two Times A Day Prn 6)  Adult Aspirin Low Strength 81 Mg  Tbdp (Aspirin) .... Take 1 Tablet By Mouth Once A Day 7)  Omeprazole 20 Mg  Cpdr (Omeprazole) .Marland Kitchen.. 1 Two Times A Day 8)  Pravastatin Sodium 80 Mg Tabs (Pravastatin Sodium) .... Take One Tablet By Mouth Daily At Bedtime 9)  Naprelan 375 Mg Xr24h-Tab (Naproxen Sodium) .... Take 1 By Mouth Two Times A Day As Needed 10)  Fluoxetine Hcl 20 Mg Tabs (Fluoxetine Hcl) .Marland Kitchen.. 1 Tab Daily 11)  Vitamin B-12 1000 Mcg Tabs (Cyanocobalamin) .Marland Kitchen.. 1 Tablet 3 Times A Week 12)  Spiriva Handihaler 18 Mcg Caps (Tiotropium Bromide Monohydrate) .Marland Kitchen.. 1 Capsule Inhaled Daily 13)  Triamcinolone Acetonide 0.025 % Crea (Triamcinolone Acetonide) .... Apply Bid To Affected Area 14)  Sulfamethoxazole-Tmp Ds 800-160 Mg Tabs (Sulfamethoxazole-Trimethoprim) .... 2 Tab By Mouth Two Times A Day X10 Days  Allergies: 1)  ! Keflex 2)  ! Biaxin 3)  ! Tramadol Hcl 4)  ! * Ace in Hibitors 5)  !  Keflex (Cephalexin) 6)  ! Biaxin (Clarithromycin) 7)  ! Nifedipine (Nifedipine) 8)  ! Ace Inhibitors 9)  ! Benicar (Olmesartan Medoxomil) 10)  ! Amoxicillin (Amoxicillin) 11)  ! Augmentin  Past History:  Past medical, surgical, family and social histories (including risk factors) reviewed, and no changes noted (except as noted below).  Past Medical History: Reviewed history from 08/09/2009 and no changes required. 1. COPD: Actually quit tobacco in the 1970s.  PFTs (1/11) with mild obstructive lung disease (FEV1 65% and FEV1/FVC ratio mildly decreased).   2.  Depression 3. GERD (Dr. Lottie Mussel) 4. Hyperlipidemia 5. Hypertension 6. Osteoarthritis (Dr Otelia Sergeant  [phone]): s/p right knee arthroscopy with partial medial and lateral meniscectomies; chondroplastic shaving, lateral portion of the lateral femoral condyle May 2009 7. Osteoporosis 8. Breast cancer, hx of (Dr Lemar Livings): mastectomy in 1980s.     - Removal right implant 06/12/09 9. Carotid stenosis, mild. Gets yearly dopplers.  10. Spinal stenosis s/p laminectomy in 2007.  11. ACEI cough 12. Adenosine myoview (2007): EF 74%, no ischemia or infarction.  13. Angioedema with Augmentin in 12/10 requiring intubation and complicated by aspiration pneumonitis.  14. Echo (1/11): EF 60-65%, mild LVH, RV and valves without significant abnormality.   Past Surgical History: Reviewed history from 06/18/2009 and no changes required. 1987  R mastectomy 1988 Reconstruction and L reduction mammoplasty Mastoid--childhood Appendectomy--1948 2003--lumbar laminectomy 8/06   Fracture of L & R shoulders----multiple surgeries 06/12/09 Removal of right implant under general anesthesia  Family History: Reviewed history from 07/19/2009 and no changes required. Dad died @69 , had MI in his late 42s Mom died @72   ovarian cancer 1 brother died of lung cancer No DM Arthritis in family  Social History: Reviewed history from 07/19/2009 and no changes required. 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  Has living will. Son Virl Diamond is health care POA.  Requests DNR  Review of Systems General:  Denies fatigue. CV:  Denies chest pain or discomfort. Resp:  Denies shortness of breath. GI:  Denies abdominal pain. GU:  Denies dysuria.  Physical Exam  General:  Elderly, well-developed,well-nourished,in no acute distress; alert,appropriate and cooperative throughout examination Mouth:  MMM Neck:  no carotid bruit or thyromegaly no  cervical or supraclavicular lymphadenopathy  Lungs:  Normal respiratory effort, chest expands symmetrically. Lungs are clear to auscultation, no crackles or wheezes. Heart:  Normal rate and regular rhythm. S1 and S2 normal without gallop, murmur, click, rub or other extra sounds. Abdomen:  Bowel sounds positive,abdomen soft and non-tender without masses, organomegaly or hernias noted. Pulses:  R and L posterior tibial pulses are full and equal bilaterally  Extremities:  1+ left pedal edema and 1+ right pedal edema.   Skin:   erythematous warm  plaque on right lower leg, per pt spreading 3 inches up calve and down into ankle. Seperate plaque on right dorsal foot and left ankle.    Impression & Recommendations:  Problem # 1:  SKIN RASH (ICD-782.1) B leg rash woth swelling most consistent with skin changes due to peripheral swelling, but given warmth, increase in spread in last 72 hours.Marland KitchenMarland KitchenI will cover for cellulitis. Multiple drug allergies.Marland Kitchenwill treat with sulfa to cover for MRSA given hospitalization in last 6 months.  Close follow as already scheduled with Dr. Alphonsus Sias in 1 week.   Her updated medication list for this problem includes:    Triamcinolone Acetonide 0.025 % Crea (Triamcinolone acetonide) .Marland Kitchen... Apply bid to affected area  Problem # 2:  DERMATITIS, STASIS (ICD-454.1) Encouraged continued elevation above head. Hold using compression hose until concern for infection is resolved.   Complete Medication List: 1)  Boniva Tabs (Ibandronate sodium tabs) .... Monthly 2)  Procardia Xl 90 Mg Xr24h-tab (Nifedipine) .Marland Kitchen.. 1 tab by mouth daily 3)  Oscal 500/200 D-3 500-200 Mg-unit Tabs (Calcium-vitamin d) .... 2 daily 4)  Multivitamins Tabs (Multiple vitamin) .... Take 1 tablet by mouth once a day 5)  Vicodin Tabs (Hydrocodone-acetaminophen tabs) .... Take 1 tablet by mouth two times a day prn 6)  Adult Aspirin Low Strength 81 Mg Tbdp (Aspirin) .... Take 1 tablet by mouth once a day 7)   Omeprazole 20 Mg Cpdr (Omeprazole) .Marland Kitchen.. 1 two times a day 8)  Pravastatin Sodium 80 Mg Tabs (Pravastatin sodium) .... Take one tablet by mouth daily at bedtime 9)  Naprelan 375 Mg Xr24h-tab (Naproxen sodium) .... Take 1 by mouth two times a day as needed 10)  Fluoxetine Hcl 20 Mg Tabs (Fluoxetine hcl) .Marland Kitchen.. 1 tab daily 11)  Vitamin B-12 1000 Mcg Tabs (Cyanocobalamin) .Marland Kitchen.. 1 tablet 3 times a week 12)  Spiriva Handihaler 18 Mcg Caps (Tiotropium bromide monohydrate) .Marland Kitchen.. 1 capsule inhaled daily 13)  Triamcinolone Acetonide 0.025 % Crea (Triamcinolone acetonide) .... Apply bid to affected area 14)  Sulfamethoxazole-tmp Ds 800-160 Mg Tabs (Sulfamethoxazole-trimethoprim) .... 2 tab by mouth two times a day x10 days  Patient Instructions: 1)  Follow BP at home on lower dose of procardia. 2)  Elevate feet above heart. Try to walk frequently. 3)  Start Bactrim twice daily. 4)  Keep follow up appt with Dr. Alphonsus Sias Prescriptions: TRIAMCINOLONE ACETONIDE 0.025 % CREA (TRIAMCINOLONE ACETONIDE) apply bid to affected area  #30 g x 0   Entered and Authorized by:   Kerby Nora MD   Signed by:   Kerby Nora MD on 09/06/2009   Method used:   Electronically to        [facility]* (retail)       [address]       [place], [place]  [address]       Ph: [phone]       Fax: [phone]   RxID:   [id number] SULFAMETHOXAZOLE-TMP DS 800-160 MG TABS (SULFAMETHOXAZOLE-TRIMETHOPRIM) 2 tab by mouth two times a day x10 days  #40 x 0   Entered and Authorized by:   Kerby Nora MD   Signed by:   Kerby Nora MD on 09/06/2009   Method used:   Electronically to        [facility]* (retail)       [address]       [place], [place]  [address]       Ph: [phone]       Fax: [phone]   RxID:   [id number]   Current Allergies (reviewed today): ! KEFLEX ! BIAXIN ! TRAMADOL HCL ! * ACE IN HIBITORS ! KEFLEX (CEPHALEXIN) ! BIAXIN (CLARITHROMYCIN) ! NIFEDIPINE (NIFEDIPINE) ! ACE INHIBITORS !  BENICAR (OLMESARTAN MEDOXOMIL) ! AMOXICILLIN (AMOXICILLIN) ! AUGMENTIN

## 2010-08-14 NOTE — Assessment & Plan Note (Signed)
Summary: 8:30 ALLERGIES/DS   Vital Signs:  Patient profile:   77 year old female Weight:      165 pounds Temp:     98.3 degrees F oral BP sitting:   150 / 90  (left arm) Cuff size:   regular  Vitals Entered By: Mervin Hack CMA [facility]) (December 18, 2009 8:33 AM) CC: allergies   History of Present Illness: Has had her "allergies" worsen coughing, sneezing and eyes swollen and watering has taken 2-3 zyrtec daily and this helps some This causes dry mouth, then needs to sleep, then has nocturia and incontinence  Milder problems with allergies all spring worse for 5 days though No fever Clear nasal discharge and mucus from PND  has been outside more lately Air conditioner died---just had it fixed. Duct work replaced under the house. Ducts were blown clean a couple of years ago replaces filter monthly  Used flonase in past--felt it helped  Allergies: 1)  ! Keflex 2)  ! Biaxin 3)  ! Tramadol Hcl 4)  ! * Ace in Hibitors 5)  ! Keflex (Cephalexin) 6)  ! Biaxin (Clarithromycin) 7)  ! Nifedipine (Nifedipine) 8)  ! Ace Inhibitors 9)  ! Benicar (Olmesartan Medoxomil) 10)  ! Amoxicillin (Amoxicillin) 11)  ! Augmentin  Past History:  Past medical, surgical, family and social histories (including risk factors) reviewed for relevance to current acute and chronic problems.  Past Medical History: 1. COPD: Actually quit tobacco in the 1970s.  PFTs (1/11) with mild obstructive lung disease (FEV1 65% and    FEV1/FVC ratio mildly decreased).   2. Depression 3. GERD (Dr. Lottie Mussel) 4. Hyperlipidemia 5. Hypertension 6. Osteoarthritis (Dr Otelia Sergeant  [phone]): s/p right knee arthroscopy with partial medial and lateral meniscectomies;     chondroplastic shaving, lateral portion of the lateral femoral condyle May 2009 7. Osteoporosis 8. Breast cancer, hx of (Dr Lemar Livings): mastectomy in 1980s.     - Removal right implant 06/12/09 9. Carotid stenosis, mild. Gets yearly dopplers.  10. Spinal  stenosis s/p laminectomy in 2007.  11. ACEI cough 12. Adenosine myoview (2007): EF 74%, no ischemia or infarction.  13. Angioedema with Augmentin in 12/10 requiring intubation and complicated by aspiration pneumonitis.  14. Echo (1/11): EF 60-65%, mild LVH, RV and valves without significant abnormality.  15.Allergic rhinitis  Past Surgical History: Reviewed history from 06/18/2009 and no changes required. 1987  R mastectomy 1988 Reconstruction and L reduction mammoplasty Mastoid--childhood Appendectomy--1948 2003--lumbar laminectomy 8/06   Fracture of L & R shoulders----multiple surgeries 06/12/09 Removal of right implant under general anesthesia  Family History: Reviewed history from 07/19/2009 and no changes required. Dad died @69 , had MI in his late 54s Mom died @72   ovarian cancer 1 brother died of lung cancer No DM Arthritis in family  Social History: Reviewed history from 07/19/2009 and no changes required. 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  Has living will. Son Virl Diamond is health care POA.  Requests DNR  Review of Systems       doesn't feel sick no nausea  eating fine No sig SOB  Physical Exam  General:  alert.  NAD Head:  Mild maxillary tenderness no frontal tenderness Ears:  R ear normal and L ear normal.   Nose:  mild pale congestion Mouth:  no erythema and no exudates.   Neck:  supple, no masses, and no cervical lymphadenopathy.   Lungs:  normal respiratory effort, no  intercostal retractions, no accessory muscle use, normal breath sounds, no crackles, and no wheezes.     Impression & Recommendations:  Problem # 1:  ALLERGIC RHINITIS (ICD-477.9) Assessment New  has had mild symptoms in past but now much worse not clear mold exposure--discussed possible issues  will continue antihistamines add fluticasone if no better in 2 weeks, try singulair  Her updated  medication list for this problem includes:    Fluticasone Propionate 50 Mcg/act Susp (Fluticasone propionate) .Marland Kitchen... 2 sprays in each nostril daily for allergy symptoms    Fexofenadine Hcl 180 Mg Tabs (Fexofenadine hcl) .Marland Kitchen... 1 tab daily as needed for allergies    Cetirizine Hcl 10 Mg Tabs (Cetirizine hcl) .Marland Kitchen... 1 tab up to two times a day for allergies  Complete Medication List: 1)  Boniva Tabs (Ibandronate sodium tabs) .... Monthly 2)  Multivitamins Tabs (Multiple vitamin) .... Take 1 tablet by mouth once a day 3)  Vicodin Tabs (Hydrocodone-acetaminophen tabs) .... Take 1 tablet by mouth two times a day prn 4)  Adult Aspirin Low Strength 81 Mg Tbdp (Aspirin) .... Take 1 tablet by mouth once a day 5)  Omeprazole 20 Mg Cpdr (Omeprazole) .Marland Kitchen.. 1 two times a day 6)  Pravastatin Sodium 80 Mg Tabs (Pravastatin sodium) .... Take one tablet by mouth daily at bedtime 7)  Naprelan 375 Mg Xr24h-tab (Naproxen sodium) .... Take 1 by mouth two times a day as needed 8)  Fluoxetine Hcl 20 Mg Tabs (Fluoxetine hcl) .Marland Kitchen.. 1 tab daily 9)  Vitamin B-12 1000 Mcg Tabs (Cyanocobalamin) .Marland Kitchen.. 1 tablet 3 times a week 10)  Spiriva Handihaler 18 Mcg Caps (Tiotropium bromide monohydrate) .Marland Kitchen.. 1 capsule inhaled daily 11)  Triamterene-hctz 37.5-25 Mg Tabs (Triamterene-hctz) .Marland Kitchen.. 1 tab daily 12)  Caltrate 600 1500 Mg Tabs (Calcium carbonate) .... Take 1 by mouth once daily 13)  Fluticasone Propionate 50 Mcg/act Susp (Fluticasone propionate) .... 2 sprays in each nostril daily for allergy symptoms 14)  Fexofenadine Hcl 180 Mg Tabs (Fexofenadine hcl) .Marland Kitchen.. 1 tab daily as needed for allergies 15)  Cetirizine Hcl 10 Mg Tabs (Cetirizine hcl) .Marland Kitchen.. 1 tab up to two times a day for allergies  Patient Instructions: 1)  Please keep regular follow up 2)  Call if allergies not significantly better within 2 weeks Prescriptions: FLUTICASONE PROPIONATE 50 MCG/ACT SUSP (FLUTICASONE PROPIONATE) 2 sprays in each nostril daily for allergy  symptoms  #1 x 12   Entered and Authorized by:   Cindee Salt MD   Signed by:   Cindee Salt MD on 12/18/2009   Method used:   Electronically to        [facility]* (retail)       [address]       [place], [place]  [address]       Ph: [phone]       Fax: [phone]   RxID:   [id number]   Current Allergies (reviewed today): ! KEFLEX ! BIAXIN ! TRAMADOL HCL ! * ACE IN HIBITORS ! KEFLEX (CEPHALEXIN) ! BIAXIN (CLARITHROMYCIN) ! NIFEDIPINE (NIFEDIPINE) ! ACE INHIBITORS ! BENICAR (OLMESARTAN MEDOXOMIL) ! AMOXICILLIN (AMOXICILLIN) ! AUGMENTIN

## 2010-08-14 NOTE — Progress Notes (Signed)
Summary: BP is elevated  Phone Note Call from Patient Call back at [phone]   [name]: Patient Call For: Cindee Salt MD Summary of Call: Pt was seen by her endocrinologist at [facility] this morning and her BP was 235/120, 189/101, 197/118.  She has no sxs- no head ache, no chest pain.  She is asking if she should increase her maxzide until she can be seen. Dr. Alphonsus Sias is gone for the day. Initial call taken by: Lowella Petties CMA,  April 29, 2010 2:18 PM  Follow-up for Phone Call        yes can increase to 2 tab by mouth daily..but may not improve significantly.  Call back in tommorow if BPs not better tommorow.  Follow-up by: Kerby Nora MD,  April 29, 2010 2:39 PM  Additional Follow-up for Phone Call Additional follow up Details #1::        Patient advised.Consuello Masse CMA   Additional Follow-up by: Benny Lennert CMA [facility] Dull),  April 29, 2010 4:25 PM

## 2010-08-14 NOTE — Assessment & Plan Note (Signed)
Summary: CHEST PAIN/ 12:30 PER DR Stefan Markarian/ lb   Vital Signs:  Patient profile:   77 year old female O2 Sat:      97 % on Room air Temp:     97.1 degrees F oral Pulse rate:   76 / minute Pulse rhythm:   regular BP sitting:   160 / 90  (left arm) Cuff size:   regular  Vitals Entered By: Mervin Hack CMA [facility] Dull) (October 23, 2009 12:58 PM)  O2 Flow:  Room air CC: chest pain/ indigestion   History of Present Illness: Awoken by chest pains 4 nights ago---   ~2AM Tight feeling with numbness down both arms jaw numbness Burping at the end radiated to both arms took a while to go away  Recurred the next 2 night and was worse tums seemed to help  did break out in cold sweat  took 2 tums last night and no symptoms then  No SOB Gets up to move around--may actually help drink of water seems to help No daytime symptoms except slight heavy sensation  No exertional symptoms despite walking and riding exercise bike No symptoms then  Allergies: 1)  ! Keflex 2)  ! Biaxin 3)  ! Tramadol Hcl 4)  ! * Ace in Hibitors 5)  ! Keflex (Cephalexin) 6)  ! Biaxin (Clarithromycin) 7)  ! Nifedipine (Nifedipine) 8)  ! Ace Inhibitors 9)  ! Benicar (Olmesartan Medoxomil) 10)  ! Amoxicillin (Amoxicillin) 11)  ! Augmentin  Past History:  Past medical, surgical, family and social histories (including risk factors) reviewed for relevance to current acute and chronic problems.  Past Medical History: Reviewed history from 09/13/2009 and no changes required. 1. COPD: Actually quit tobacco in the 1970s.  PFTs (1/11) with mild obstructive lung disease (FEV1 65% and    FEV1/FVC ratio mildly decreased).   2. Depression 3. GERD (Dr. Lottie Mussel) 4. Hyperlipidemia 5. Hypertension 6. Osteoarthritis (Dr Otelia Sergeant  [phone]): s/p right knee arthroscopy with partial medial and lateral meniscectomies;     chondroplastic shaving, lateral portion of the lateral femoral condyle May 2009 7. Osteoporosis 8. Breast  cancer, hx of (Dr Lemar Livings): mastectomy in 1980s.     - Removal right implant 06/12/09 9. Carotid stenosis, mild. Gets yearly dopplers.  10. Spinal stenosis s/p laminectomy in 2007.  11. ACEI cough 12. Adenosine myoview (2007): EF 74%, no ischemia or infarction.  13. Angioedema with Augmentin in 12/10 requiring intubation and complicated by aspiration pneumonitis.  14. Echo (1/11): EF 60-65%, mild LVH, RV and valves without significant abnormality.   Past Surgical History: Reviewed history from 06/18/2009 and no changes required. 1987  R mastectomy 1988 Reconstruction and L reduction mammoplasty Mastoid--childhood Appendectomy--1948 2003--lumbar laminectomy 8/06   Fracture of L & R shoulders----multiple surgeries 06/12/09 Removal of right implant under general anesthesia  Family History: Reviewed history from 07/19/2009 and no changes required. Dad died @69 , had MI in his late 65s Mom died @72   ovarian cancer 1 brother died of lung cancer No DM Arthritis in family  Social History: Reviewed history from 07/19/2009 and no changes required. 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  Has living will. Son Virl Diamond is health care POA.  Requests DNR  Review of Systems       appetite is okay bowels are stable uses activia 3-4 days per week  Physical Exam  General:  alert and normal appearance.   Neck:  supple, no masses,  no thyromegaly, no carotid bruits, and no cervical lymphadenopathy.   Lungs:  normal respiratory effort and normal breath sounds.   Heart:  normal rate, regular rhythm, no murmur, and no gallop.   Abdomen:  soft, non-tender, and no masses.   Extremities:  no edema Psych:  normally interactive, good eye contact, not anxious appearing, and not depressed appearing.     Impression & Recommendations:  Problem # 1:  CHEST PAIN (ICD-786.50) Assessment Comment Only  clearly atypical  since it occurs at night and no symptoms during exertion The overall character of the episodes is worrisome----chest tightness with radiation to jaw and arms. Some diaphoresis  Main other option is GERD related and there are no other symptoms to suggest this is different ahd she remains on Rx  P: I will send this note to Dr Shirlee Latch to decide the next step. I feel she probably needs at least reeval by him . Last stress test negative about 4 years ago  Orders: EKG w/ Interpretation (93000)  Complete Medication List: 1)  Boniva Tabs (Ibandronate sodium tabs) .... Monthly 2)  Multivitamins Tabs (Multiple vitamin) .... Take 1 tablet by mouth once a day 3)  Vicodin Tabs (Hydrocodone-acetaminophen tabs) .... Take 1 tablet by mouth two times a day prn 4)  Adult Aspirin Low Strength 81 Mg Tbdp (Aspirin) .... Take 1 tablet by mouth once a day 5)  Omeprazole 20 Mg Cpdr (Omeprazole) .Marland Kitchen.. 1 two times a day 6)  Pravastatin Sodium 80 Mg Tabs (Pravastatin sodium) .... Take one tablet by mouth daily at bedtime 7)  Naprelan 375 Mg Xr24h-tab (Naproxen sodium) .... Take 1 by mouth two times a day as needed 8)  Fluoxetine Hcl 20 Mg Tabs (Fluoxetine hcl) .Marland Kitchen.. 1 tab daily 9)  Vitamin B-12 1000 Mcg Tabs (Cyanocobalamin) .Marland Kitchen.. 1 tablet 3 times a week 10)  Spiriva Handihaler 18 Mcg Caps (Tiotropium bromide monohydrate) .Marland Kitchen.. 1 capsule inhaled daily 11)  Triamterene-hctz 37.5-25 Mg Tabs (Triamterene-hctz) .Marland Kitchen.. 1 tab daily 12)  Caltrate 600 1500 Mg Tabs (Calcium carbonate) .... Take 1 by mouth once daily  Patient Instructions: 1)  I will send the note to Dr Shirlee Latch to review and likely set up reevaluation. 2)  Please keep your regularly schedule appt with me  Current Allergies (reviewed today): ! KEFLEX ! BIAXIN ! TRAMADOL HCL ! * ACE IN HIBITORS ! KEFLEX (CEPHALEXIN) ! BIAXIN (CLARITHROMYCIN) ! NIFEDIPINE (NIFEDIPINE) ! ACE INHIBITORS ! BENICAR (OLMESARTAN MEDOXOMIL) ! AMOXICILLIN (AMOXICILLIN) !  AUGMENTIN   EKG  Procedure date:  10/23/2009  Findings:      sinus @71  Inferior T wave changes and poor R wave progression are essentially unchanged since December   Appended Document: CHEST PAIN/ 12:30 PER DR Lukus Binion/ lb I think she probably should have another myoview (last was in 2007).  I will set her up for this.  We will talk with her to see if she has had anymore symptoms.   Appended Document: CHEST PAIN/ 12:30 PER DR Vira Chaplin/ lb noted

## 2010-08-14 NOTE — Assessment & Plan Note (Signed)
Summary: RETURN OFFICE VISIT R/S FROM 09/17/09   Vital Signs:  Patient profile:   77 year old female Weight:      164 pounds Temp:     98.4 degrees F oral Pulse rate:   84 / minute Pulse rhythm:   regular BP sitting:   142 / 78  (left arm) Cuff size:   regular  Vitals Entered By: Mervin Hack CMA [facility] Dull) (September 13, 2009 10:52 AM) CC: follow-up visit for rash and leg swelling   History of Present Illness: Still with rash seems to improve and then moves to another spot Bad itching--benedryl helps Decreased the procardia to 60mg  ---leg swellling seems better  Breathing had been improving using stationery bike walking a little more got worse this week---relates to tree pollen Plans to start her zyrtec SOB causes anxiety and then her breathing is worse Spiriva has seemed to be helpful  Checks BP at home (wrist cuff) 120-130/70's No headaches No chest pain  stomach okay no sig reflux problems  Allergies: 1)  ! Keflex 2)  ! Biaxin 3)  ! Tramadol Hcl 4)  ! * Ace in Hibitors 5)  ! Keflex (Cephalexin) 6)  ! Biaxin (Clarithromycin) 7)  ! Nifedipine (Nifedipine) 8)  ! Ace Inhibitors 9)  ! Benicar (Olmesartan Medoxomil) 10)  ! Amoxicillin (Amoxicillin) 11)  ! Augmentin  Past History:  Past medical, surgical, family and social histories (including risk factors) reviewed for relevance to current acute and chronic problems.  Past Medical History: 1. COPD: Actually quit tobacco in the 1970s.  PFTs (1/11) with mild obstructive lung disease (FEV1 65% and    FEV1/FVC ratio mildly decreased).   2. Depression 3. GERD (Dr. Lottie Mussel) 4. Hyperlipidemia 5. Hypertension 6. Osteoarthritis (Dr Otelia Sergeant  [phone]): s/p right knee arthroscopy with partial medial and lateral meniscectomies;     chondroplastic shaving, lateral portion of the lateral femoral condyle May 2009 7. Osteoporosis 8. Breast cancer, hx of (Dr Lemar Livings): mastectomy in 1980s.     - Removal right implant 06/12/09 9.  Carotid stenosis, mild. Gets yearly dopplers.  10. Spinal stenosis s/p laminectomy in 2007.  11. ACEI cough 12. Adenosine myoview (2007): EF 74%, no ischemia or infarction.  13. Angioedema with Augmentin in 12/10 requiring intubation and complicated by aspiration pneumonitis.  14. Echo (1/11): EF 60-65%, mild LVH, RV and valves without significant abnormality.   Past Surgical History: Reviewed history from 06/18/2009 and no changes required. 1987  R mastectomy 1988 Reconstruction and L reduction mammoplasty Mastoid--childhood Appendectomy--1948 2003--lumbar laminectomy 8/06   Fracture of L & R shoulders----multiple surgeries 06/12/09 Removal of right implant under general anesthesia  Family History: Reviewed history from 07/19/2009 and no changes required. Dad died @69 , had MI in his late 67s Mom died @72   ovarian cancer 1 brother died of lung cancer No DM Arthritis in family  Social History: Reviewed history from 07/19/2009 and no changes required. 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  Has living will. Son Virl Diamond is health care POA.  Requests DNR  Review of Systems       sleeping variably---itching has affected her recently Benedryl does help Busy with her weight watcher's classes   Physical Exam  General:  alert.  NAD Neck:  supple, no masses, no thyromegaly, no carotid bruits, and no cervical lymphadenopathy.   Lungs:  normal respiratory effort, no intercostal retractions, no accessory muscle use, no crackles, and no wheezes.  Slight decreased breath sounds in medial lower lobes Heart:  normal rate, regular rhythm, no murmur, and no gallop.   Extremities:  1+ edema Skin:  reddish to brawny changes in lower anterior calves Psych:  normally interactive, good eye contact, not anxious appearing, and not depressed appearing.     Impression & Recommendations:  Problem # 1:   DERMATITIS, STASIS (ICD-454.1) Assessment Unchanged I believe her leg rash is stasis changes will stop the procardia as it has caused the swelling--at least to some degree  Problem # 2:  HYPERTENSION (ICD-401.9) Assessment: Comment Only  will stop procardia change to HCTZ will recheck soon and add second med as needed   The following medications were removed from the medication list:    Procardia Xl 90 Mg Xr24h-tab (Nifedipine) .Marland Kitchen... 1 tab by mouth daily    Procardia Xl 60 Mg Xr24h-tab (Nifedipine) .Marland Kitchen... Take 1 by mouth once daily Her updated medication list for this problem includes:    Triamterene-hctz 37.5-25 Mg Tabs (Triamterene-hctz) .Marland Kitchen... 1 tab daily  BP today: 142/78 Prior BP: 130/82 (09/06/2009)  Labs Reviewed: K+: 4.4 (07/16/2009) Creat: : 0.7 (07/16/2009)   Chol: 210 (04/16/2009)   HDL: 74.40 (04/16/2009)   LDL: DEL (11/04/2007)   TG: 149.0 (04/16/2009)  Orders: TLB-Renal Function Panel (80069-RENAL) Venipuncture (04540)  Problem # 3:  DYSPNEA/SHORTNESS OF BREATH (ICD-786.09) Assessment: Unchanged waxes and wanes may be better with less edema and fluid feels the spiriva has helped  Her updated medication list for this problem includes:    Spiriva Handihaler 18 Mcg Caps (Tiotropium bromide monohydrate) .Marland Kitchen... 1 capsule inhaled daily    Triamterene-hctz 37.5-25 Mg Tabs (Triamterene-hctz) .Marland Kitchen... 1 tab daily  Problem # 4:  HYPERLIPIDEMIA (ICD-272.4) Assessment: Unchanged  due for labs  Her updated medication list for this problem includes:    Pravastatin Sodium 80 Mg Tabs (Pravastatin sodium) .Marland Kitchen... Take one tablet by mouth daily at bedtime  Orders: TLB-Lipid Panel (80061-LIPID) TLB-Hepatic/Liver Function Pnl (80076-HEPATIC)  Complete Medication List: 1)  Boniva Tabs (Ibandronate sodium tabs) .... Monthly 2)  Oscal 500/200 D-3 500-200 Mg-unit Tabs (Calcium-vitamin d) .... 2 daily 3)  Multivitamins Tabs (Multiple vitamin) .... Take 1 tablet by mouth once a  day 4)  Vicodin Tabs (Hydrocodone-acetaminophen tabs) .... Take 1 tablet by mouth two times a day prn 5)  Adult Aspirin Low Strength 81 Mg Tbdp (Aspirin) .... Take 1 tablet by mouth once a day 6)  Omeprazole 20 Mg Cpdr (Omeprazole) .Marland Kitchen.. 1 two times a day 7)  Pravastatin Sodium 80 Mg Tabs (Pravastatin sodium) .... Take one tablet by mouth daily at bedtime 8)  Naprelan 375 Mg Xr24h-tab (Naproxen sodium) .... Take 1 by mouth two times a day as needed 9)  Fluoxetine Hcl 20 Mg Tabs (Fluoxetine hcl) .Marland Kitchen.. 1 tab daily 10)  Vitamin B-12 1000 Mcg Tabs (Cyanocobalamin) .Marland Kitchen.. 1 tablet 3 times a week 11)  Spiriva Handihaler 18 Mcg Caps (Tiotropium bromide monohydrate) .Marland Kitchen.. 1 capsule inhaled daily 12)  Triamcinolone Acetonide 0.025 % Crea (Triamcinolone acetonide) .... Apply bid to affected area 13)  Triamterene-hctz 37.5-25 Mg Tabs (Triamterene-hctz) .Marland Kitchen.. 1 tab daily  Patient Instructions: 1)  Please schedule a follow-up appointment in 4-6 weeks.  Prescriptions: TRIAMTERENE-HCTZ 37.5-25 MG TABS (TRIAMTERENE-HCTZ) 1 tab daily  #30 x 12   Entered and Authorized by:   Cindee Salt MD   Signed by:   Cindee Salt MD on 09/13/2009   Method used:   Electronically to  [facility]* (retail)       [address]       [place], [place]  [address]       Ph: [phone]       Fax: [phone]   RxID:   [id number] SPIRIVA HANDIHALER 18 MCG CAPS (TIOTROPIUM BROMIDE MONOHYDRATE) 1 capsule inhaled daily  #90 x 3   Entered and Authorized by:   Cindee Salt MD   Signed by:   Cindee Salt MD on 09/13/2009   Method used:   Electronically to        [facility]* (mail-order)             ,          Ph: [phone]       Fax: [phone]   RxID:   [id number]   Current Allergies (reviewed today): ! KEFLEX ! BIAXIN ! TRAMADOL HCL ! * ACE IN HIBITORS ! KEFLEX (CEPHALEXIN) ! BIAXIN (CLARITHROMYCIN) ! NIFEDIPINE (NIFEDIPINE) ! ACE INHIBITORS ! BENICAR (OLMESARTAN  MEDOXOMIL) ! AMOXICILLIN (AMOXICILLIN) ! AUGMENTIN

## 2010-08-14 NOTE — Assessment & Plan Note (Signed)
Summary: Cardiology Nuclear Study  Nuclear Med Background Indications for Stress Test: Evaluation for Ischemia   History: COPD, Emphysema, Myocardial Perfusion Study  History Comments: '07 ZOX:WRUEAV, EF=74%  Symptoms: Chest Tightness, Diaphoresis, DOE  Symptoms Comments: CP radiates to the jaw and arms. Last episode of CP:4:00 am today.   Nuclear Pre-Procedure Cardiac Risk Factors: Carotid Disease, Family History - CAD, History of Smoking, Hypertension, Lipids Caffeine/Decaff Intake: None NPO After: 7:30 AM Lungs: Clear.  O2 Sat 97% on RA. IV 0.9% NS with Angio Cath: 22g     IV Site: (L) AC IV Started by: Irean Hong RN Chest Size (in) 38     Cup Size B     Height (in): 67 Weight (lb): 163 BMI: 25.62  Nuclear Med Study 1 or 2 day study:  1 day     Stress Test Type:  Eugenie Birks Reading MD:  Charlton Haws, MD     Referring MD:  Marca Ancona, MD Resting Radionuclide:  Technetium 89m Tetrofosmin     Resting Radionuclide Dose:  11 mCi  Stress Radionuclide:  Technetium 5m Tetrofosmin     Stress Radionuclide Dose:  33 mCi   Stress Protocol   Lexiscan: 0.4 mg   Stress Test Technologist:  Rea [employer] CMA-N     Nuclear Technologist:  Domenic Polite CNMT  Rest Procedure  Myocardial perfusion imaging was performed at rest 45 minutes following the intravenous administration of Myoview Technetium 75m Tetrofosmin.  Stress Procedure  The patient received IV Lexiscan 0.4 mg over 15-seconds.  Myoview injected at 30-seconds.  There were no significant changes with lexiscan; rare PAC.  Quantitative spect images were obtained after a 45 minute delay.  QPS Raw Data Images:  Normal; no motion artifact; normal heart/lung ratio. Stress Images:  NI: Uniform and normal uptake of tracer in all myocardial segments. Rest Images:  Normal homogeneous uptake in all areas of the myocardium. Subtraction (SDS):  Normal Transient Ischemic Dilatation:  .86  (Normal <1.22)  Lung/Heart Ratio:  .4   (Normal <0.45)  Quantitative Gated Spect Images QGS EDV:  69 ml QGS ESV:  17 ml QGS EF:  75 % QGS cine images:  normal  Findings Normal nuclear study      Overall Impression  Exercise Capacity: Lexiscan BP Response: Normal blood pressure response. Clinical Symptoms: No chest pain ECG Impression: No significant ST segment change suggestive of ischemia. Overall Impression: Normal stress nuclear study.  Appended Document: Cardiology Nuclear Study normal study  Appended Document: Cardiology Nuclear Study Attempted to contact pt with results.  LMOM TCB.  EWJ  Appended Document: Cardiology Nuclear Study Please make sure patient knows the stress test was normal and this is very reassuring  Appended Document: Cardiology Nuclear Study Spoke with pt advised stress test normal and this was very reassuring per Dr Alphonsus Sias.  EWJ

## 2010-08-14 NOTE — Letter (Signed)
Summary: Brooks Sailors MD  Brooks Sailors MD   Imported By: Lester Landess 05/14/2010 09:16:28  _____________________________________________________________________  External Attachment:    Type:   Image     Comment:   External Document

## 2010-08-14 NOTE — Progress Notes (Signed)
Summary: Hearing Referral  Phone Note From Other Clinic   [name]: Dr. Willette Alma Office  - [facility] Call For: Dr. Alphonsus Sias Summary of Call: This patient has requested an appointment for hearing evaluation and they need a referral in order for Medicare to pay.   Phone:  [phone]   Fax:  [phone] Initial call taken by: Delilah Shan CMA Duncan Dull),  Nov 21, 2009 10:20 AM  Follow-up for Phone Call        okay to make referral Follow-up by: Cindee Salt MD,  Nov 21, 2009 1:16 PM

## 2010-08-14 NOTE — Letter (Signed)
Summary: Out of Work  [facility] [employer] [facility]  [address] [place], [place] [address]   Phone: [phone]  Fax: [phone]    July 16, 2009   Employee:  JULLIE ARPS    To Whom It May Concern:   For Medical reasons, please excuse the above named employee from work for the following dates:  Start:   07/16/2009  End:   when seen by Cardiology  If you need additional information, please feel free to contact our office.         Sincerely,    Shaune Leeks MD

## 2010-08-14 NOTE — Assessment & Plan Note (Signed)
Summary: RASH ON LEG   Vital Signs:  Patient profile:   77 year old female Height:      67 inches Weight:      167 pounds BMI:     26.25 Temp:     98 degrees F oral Pulse rate:   80 / minute Pulse rhythm:   regular BP sitting:   122 / 72  (left arm) Cuff size:   regular  Vitals Entered By: Delilah Shan CMA Duncan Dull) (September 03, 2009 12:18 PM) CC: Rash on legs   History of Present Illness: 77 yo with 3 day h/o itchy rash on right lower leg. Has known h/o LE edema, followed by cards, does not appear to have CHF (normal echo). She is concerned this is cellultis. No fevers, chills, n/v/d. No worsening shortness of breath, no chest pain.  Wears compression stockings everyday but not since this rash started.  Current Medications (verified): 1)  Boniva   Tabs (Ibandronate Sodium Tabs) .... Monthly 2)  Procardia Xl 90 Mg Xr24h-Tab (Nifedipine) .Marland Kitchen.. 1 Tab By Mouth Daily 3)  Oscal 500/200 D-3 500-200 Mg-Unit  Tabs (Calcium-Vitamin D) .... 2 Daily 4)  Multivitamins   Tabs (Multiple Vitamin) .... Take 1 Tablet By Mouth Once A Day 5)  Vicodin   Tabs (Hydrocodone-Acetaminophen Tabs) .... Take 1 Tablet By Mouth Two Times A Day Prn 6)  Adult Aspirin Low Strength 81 Mg  Tbdp (Aspirin) .... Take 1 Tablet By Mouth Once A Day 7)  Omeprazole 20 Mg  Cpdr (Omeprazole) .Marland Kitchen.. 1 Two Times A Day 8)  Pravastatin Sodium 80 Mg Tabs (Pravastatin Sodium) .... Take One Tablet By Mouth Daily At Bedtime 9)  Naprelan 375 Mg Xr24h-Tab (Naproxen Sodium) .... Take 1 By Mouth Two Times A Day As Needed 10)  Fluoxetine Hcl 20 Mg Tabs (Fluoxetine Hcl) .Marland Kitchen.. 1 Tab Daily 11)  Vitamin B-12 1000 Mcg Tabs (Cyanocobalamin) .Marland Kitchen.. 1 Tablet 3 Times A Week 12)  Spiriva Handihaler 18 Mcg Caps (Tiotropium Bromide Monohydrate) .Marland Kitchen.. 1 Capsule Inhaled Daily 13)  Triamcinolone Acetonide 0.025 % Crea (Triamcinolone Acetonide) .... Apply Bid To Affected Area  Allergies: 1)  ! Keflex 2)  ! Biaxin 3)  ! Tramadol Hcl 4)  ! * Ace in  Hibitors 5)  ! Keflex (Cephalexin) 6)  ! Biaxin (Clarithromycin) 7)  ! Nifedipine (Nifedipine) 8)  ! Ace Inhibitors 9)  ! Benicar (Olmesartan Medoxomil) 10)  ! Amoxicillin (Amoxicillin) 11)  ! Augmentin  Review of Systems      See HPI General:  Denies chills and fever. CV:  Denies chest pain or discomfort, difficulty breathing at night, difficulty breathing while lying down, palpitations, and shortness of breath with exertion. Resp:  Denies shortness of breath.  Physical Exam  General:  alert.  NAD Lungs:  normal respiratory effort, no intercostal retractions, no accessory muscle use, normal breath sounds, no crackles, and no wheezes.   Heart:  normal rate, regular rhythm, no murmur, no gallop, and no rub.   Skin:  weeping, rashed erythematous plaques on left lower leg, no ulcerations, no scabbing. Psych:  normally interactive, good eye contact, not anxious appearing, and not depressed appearing.     Impression & Recommendations:  Problem # 1:  DERMATITIS, STASIS (ICD-454.1) Assessment New  No ulcerations or break in skin. Discussed first line, conservative measures. See patient instrucitons for details. She will call at end of week to give [place] an update and has an appt with Dr. Alphonsus Sias next week.  Orders: Prescription Created  Electronically [phone])  Complete Medication List: 1)  Boniva Tabs (Ibandronate sodium tabs) .... Monthly 2)  Procardia Xl 90 Mg Xr24h-tab (Nifedipine) .Marland Kitchen.. 1 tab by mouth daily 3)  Oscal 500/200 D-3 500-200 Mg-unit Tabs (Calcium-vitamin d) .... 2 daily 4)  Multivitamins Tabs (Multiple vitamin) .... Take 1 tablet by mouth once a day 5)  Vicodin Tabs (Hydrocodone-acetaminophen tabs) .... Take 1 tablet by mouth two times a day prn 6)  Adult Aspirin Low Strength 81 Mg Tbdp (Aspirin) .... Take 1 tablet by mouth once a day 7)  Omeprazole 20 Mg Cpdr (Omeprazole) .Marland Kitchen.. 1 two times a day 8)  Pravastatin Sodium 80 Mg Tabs (Pravastatin sodium) .... Take one  tablet by mouth daily at bedtime 9)  Naprelan 375 Mg Xr24h-tab (Naproxen sodium) .... Take 1 by mouth two times a day as needed 10)  Fluoxetine Hcl 20 Mg Tabs (Fluoxetine hcl) .Marland Kitchen.. 1 tab daily 11)  Vitamin B-12 1000 Mcg Tabs (Cyanocobalamin) .Marland Kitchen.. 1 tablet 3 times a week 12)  Spiriva Handihaler 18 Mcg Caps (Tiotropium bromide monohydrate) .Marland Kitchen.. 1 capsule inhaled daily 13)  Triamcinolone Acetonide 0.025 % Crea (Triamcinolone acetonide) .... Apply bid to affected area  Patient Instructions: 1)  Gently wash t legs daily using mild non-soap cleansers (eg, Dove, Olay, Cetaphil, Caress, Neutrogena) to remove scale, bacteria, and crusts.  2)  Triamcinolone cream to leg 2-4 times a day.  This should help with itching. 3)  Continue leg elevation (when possible), leg exercises (ankle flexion, walking) to increase calf muscle strength, and compression stockings . 4)  Take 40 mg lasix daily for next 5 days. Prescriptions: TRIAMCINOLONE ACETONIDE 0.025 % CREA (TRIAMCINOLONE ACETONIDE) apply bid to affected area  #30 g x 0   Entered and Authorized by:   Ruthe Mannan MD   Signed by:   Ruthe Mannan MD on 09/03/2009   Method used:   Electronically to        [facility]* (retail)       [address]       [place], [place]  [address]       Ph: [phone]       Fax: [phone]   RxID:   [id number]   Current Allergies (reviewed today): ! [facility] ! BIAXIN ! TRAMADOL HCL ! * ACE IN HIBITORS ! KEFLEX (CEPHALEXIN) ! BIAXIN (CLARITHROMYCIN) ! NIFEDIPINE (NIFEDIPINE) ! ACE INHIBITORS ! BENICAR (OLMESARTAN MEDOXOMIL) ! AMOXICILLIN (AMOXICILLIN) ! AUGMENTIN

## 2010-08-14 NOTE — Progress Notes (Signed)
Summary: Breast Prosthesis Order  Breast Prosthesis Order   Imported By: Lanelle Bal 12/06/2009 12:25:21  _____________________________________________________________________  External Attachment:    Type:   Image     Comment:   External Document

## 2010-08-14 NOTE — Assessment & Plan Note (Signed)
Summary: 6 WK FU DLO   Vital Signs:  Patient profile:   77 year old female Weight:      164 pounds Temp:     98.3 degrees F oral Pulse rate:   92 / minute Pulse rhythm:   regular BP sitting:   128 / 72  (left arm) Cuff size:   regular  Vitals Entered By: Mervin Hack CMA Duncan Dull) (October 18, 2009 9:31 AM) CC: 6 week follow-up   History of Present Illness: Doing much better Leg selling is gone and the rash is also gone  has had a couple of falls she relates it to loss of depth perception after cataract surgery One lens is blank has fallen stepping up curb twice because she could not see it righ  Has checked BP with wrist cuff Not a problem at home was up getting carotid test yesterday----  179/90  No headache No chest pain No SOB  Allergies: 1)  ! Keflex 2)  ! Biaxin 3)  ! Tramadol Hcl 4)  ! * Ace in Hibitors 5)  ! Keflex (Cephalexin) 6)  ! Biaxin (Clarithromycin) 7)  ! Nifedipine (Nifedipine) 8)  ! Ace Inhibitors 9)  ! Benicar (Olmesartan Medoxomil) 10)  ! Amoxicillin (Amoxicillin) 11)  ! Augmentin  Past History:  Past medical, surgical, family and social histories (including risk factors) reviewed for relevance to current acute and chronic problems.  Past Medical History: Reviewed history from 09/13/2009 and no changes required. 1. COPD: Actually quit tobacco in the 1970s.  PFTs (1/11) with mild obstructive lung disease (FEV1 65% and    FEV1/FVC ratio mildly decreased).   2. Depression 3. GERD (Dr. Lottie Mussel) 4. Hyperlipidemia 5. Hypertension 6. Osteoarthritis (Dr Otelia Sergeant  [phone]): s/p right knee arthroscopy with partial medial and lateral meniscectomies;     chondroplastic shaving, lateral portion of the lateral femoral condyle May 2009 7. Osteoporosis 8. Breast cancer, hx of (Dr Lemar Livings): mastectomy in 1980s.     - Removal right implant 06/12/09 9. Carotid stenosis, mild. Gets yearly dopplers.  10. Spinal stenosis s/p laminectomy in 2007.  11. ACEI  cough 12. Adenosine myoview (2007): EF 74%, no ischemia or infarction.  13. Angioedema with Augmentin in 12/10 requiring intubation and complicated by aspiration pneumonitis.  14. Echo (1/11): EF 60-65%, mild LVH, RV and valves without significant abnormality.   Past Surgical History: Reviewed history from 06/18/2009 and no changes required. 1987  R mastectomy 1988 Reconstruction and L reduction mammoplasty Mastoid--childhood Appendectomy--1948 2003--lumbar laminectomy 8/06   Fracture of L & R shoulders----multiple surgeries 06/12/09 Removal of right implant under general anesthesia  Family History: Reviewed history from 07/19/2009 and no changes required. Dad died @69 , had MI in his late 97s Mom died @72   ovarian cancer 1 brother died of lung cancer No DM Arthritis in family  Social History: Reviewed history from 07/19/2009 and no changes required. 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  Has living will. Son Virl Diamond is health care POA.  Requests DNR  Review of Systems       has been busy at work--just check in and weighing people sleeping okay appetite is fine  Physical Exam  General:  alert and normal appearance.   Neck:  supple, no masses, no thyromegaly, no carotid bruits, and no cervical lymphadenopathy.   Lungs:  normal respiratory effort and normal breath sounds.   Heart:  normal rate, regular rhythm, no murmur, and no  gallop.   Extremities:  no edema Skin:  stasis changes are gone Psych:  normally interactive, good eye contact, not anxious appearing, and not depressed appearing.     Impression & Recommendations:  Problem # 1:  DERMATITIS, STASIS (ICD-454.1) Assessment Improved gone with edema resolved  Problem # 2:  HYPERTENSION (ICD-401.9) Assessment: Improved  doing well on diuretic will check renal  Her updated medication list for this problem includes:     Triamterene-hctz 37.5-25 Mg Tabs (Triamterene-hctz) .Marland Kitchen... 1 tab daily  BP today: 128/72 Prior BP: 142/78 (09/13/2009)  Labs Reviewed: K+: 4.6 (09/13/2009) Creat: : 1.0 (09/13/2009)   Chol: 164 (09/13/2009)   HDL: 84.50 (09/13/2009)   LDL: 62 (09/13/2009)   TG: 87.0 (09/13/2009)  Orders: TLB-Renal Function Panel (80069-RENAL) Venipuncture ([address])  Complete Medication List: 1)  Boniva Tabs (Ibandronate sodium tabs) .... Monthly 2)  Oscal 500/200 D-3 500-200 Mg-unit Tabs (Calcium-vitamin d) .... 2 daily 3)  Multivitamins Tabs (Multiple vitamin) .... Take 1 tablet by mouth once a day 4)  Vicodin Tabs (Hydrocodone-acetaminophen tabs) .... Take 1 tablet by mouth two times a day prn 5)  Adult Aspirin Low Strength 81 Mg Tbdp (Aspirin) .... Take 1 tablet by mouth once a day 6)  Omeprazole 20 Mg Cpdr (Omeprazole) .Marland Kitchen.. 1 two times a day 7)  Pravastatin Sodium 80 Mg Tabs (Pravastatin sodium) .... Take one tablet by mouth daily at bedtime 8)  Naprelan 375 Mg Xr24h-tab (Naproxen sodium) .... Take 1 by mouth two times a day as needed 9)  Fluoxetine Hcl 20 Mg Tabs (Fluoxetine hcl) .Marland Kitchen.. 1 tab daily 10)  Vitamin B-12 1000 Mcg Tabs (Cyanocobalamin) .Marland Kitchen.. 1 tablet 3 times a week 11)  Spiriva Handihaler 18 Mcg Caps (Tiotropium bromide monohydrate) .Marland Kitchen.. 1 capsule inhaled daily 12)  Triamterene-hctz 37.5-25 Mg Tabs (Triamterene-hctz) .Marland Kitchen.. 1 tab daily  Patient Instructions: 1)  Please schedule a follow-up appointment in 6 months .  Prescriptions: TRIAMTERENE-HCTZ 37.5-25 MG TABS (TRIAMTERENE-HCTZ) 1 tab daily  #90 x 3   Entered and Authorized by:   Cindee Salt MD   Signed by:   Cindee Salt MD on 10/18/2009   Method used:   Electronically to        [facility]* (mail-order)             ,          Ph: [phone]       Fax: [phone]   RxID:   [id number] OMEPRAZOLE 20 MG  CPDR (OMEPRAZOLE) 1 two times a day  #180 x 3   Entered and Authorized by:   Cindee Salt MD    Signed by:   Cindee Salt MD on 10/18/2009   Method used:   Electronically to        [facility]* (mail-order)             ,          Ph: [phone]       Fax: [phone]   RxID:   [id number]   Current Allergies (reviewed today): ! KEFLEX ! BIAXIN ! TRAMADOL HCL ! * ACE IN HIBITORS ! KEFLEX (CEPHALEXIN) ! BIAXIN (CLARITHROMYCIN) ! NIFEDIPINE (NIFEDIPINE) ! ACE INHIBITORS ! BENICAR (OLMESARTAN MEDOXOMIL) ! AMOXICILLIN (AMOXICILLIN) ! AUGMENTIN

## 2010-08-14 NOTE — Assessment & Plan Note (Signed)
Summary: 1 MONTH FOLLOW UP/[facility]   Vital Signs:  Patient profile:   77 year old female Weight:      167 pounds Temp:     99.2 degrees F oral Pulse rate:   68 / minute Pulse rhythm:   regular BP sitting:   158 / 90  (left arm) Cuff size:   regular  Vitals Entered By: Mervin Hack CMA Duncan Dull) (May 28, 2010 9:32 AM) CC: 1 month follow-up   History of Present Illness: Allergies are "driving me crazy" burning and itching in eyes coughing all night nose runs and eye drainage also using delsym, colarium on eyes Takes 2 zyrtec in Am and allegra at night Chest and throat hurt from cough doesn't feel like infection Has to wear pad for incontinence from cough No fever No sputum clear nasal discharge slightly wheezy No SOB  BP seems to be better Has come down considerably  --though higher at [facility] that at home Diastolic always under 100 now systolic 140-170  some headaches but relates to the allergies No sig chest pain (other than with cough) no SOB  Allergies: 1)  ! Keflex 2)  ! Biaxin 3)  ! Tramadol Hcl 4)  ! Keflex (Cephalexin) 5)  ! Biaxin (Clarithromycin) 6)  ! Nifedipine (Nifedipine) 7)  ! Ace Inhibitors 8)  ! Benicar (Olmesartan Medoxomil) 9)  ! Amoxicillin (Amoxicillin) 10)  ! Augmentin  Past History:  Past medical, surgical, family and social histories (including risk factors) reviewed for relevance to current acute and chronic problems.  Past Medical History: Reviewed history from 12/18/2009 and no changes required. 1. COPD: Actually quit tobacco in the 1970s.  PFTs (1/11) with mild obstructive lung disease (FEV1 65% and    FEV1/FVC ratio mildly decreased).   2. Depression 3. GERD (Dr. Lottie Mussel) 4. Hyperlipidemia 5. Hypertension 6. Osteoarthritis (Dr Otelia Sergeant  [phone]): s/p right knee arthroscopy with partial medial and lateral meniscectomies;     chondroplastic shaving, lateral portion of the lateral femoral condyle May 2009 7. Osteoporosis 8.  Breast cancer, hx of (Dr Lemar Livings): mastectomy in 1980s.     - Removal right implant 06/12/09 9. Carotid stenosis, mild. Gets yearly dopplers.  10. Spinal stenosis s/p laminectomy in 2007.  11. ACEI cough 12. Adenosine myoview (2007): EF 74%, no ischemia or infarction.  13. Angioedema with Augmentin in 12/10 requiring intubation and complicated by aspiration pneumonitis.  14. Echo (1/11): EF 60-65%, mild LVH, RV and valves without significant abnormality.  15.Allergic rhinitis  Past Surgical History: Reviewed history from 06/18/2009 and no changes required. 1987  R mastectomy 1988 Reconstruction and L reduction mammoplasty Mastoid--childhood Appendectomy--1948 2003--lumbar laminectomy 8/06   Fracture of L & R shoulders----multiple surgeries 06/12/09 Removal of right implant under general anesthesia  Family History: Reviewed history from 07/19/2009 and no changes required. Dad died @69 , had MI in his late 59s Mom died @72   ovarian cancer 1 brother died of lung cancer No DM Arthritis in family  Social History: Reviewed history from 07/19/2009 and no changes required. 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  Has living will. Son Virl Diamond is health care POA.  Requests DNR  Review of Systems  The patient denies syncope.         weight stable No dizziness  Physical Exam  General:  alert.  NAD though looks a little worn out Head:  no maxillary or frontal tenderness Ears:  R ear normal and L ear  normal.   Nose:  mild pale congestion Mouth:  no erythema and no exudates.   Neck:  supple, no masses, no thyromegaly, no carotid bruits, and no cervical lymphadenopathy.   Lungs:  normal respiratory effort, no intercostal retractions, no accessory muscle use, normal breath sounds, no crackles, and no wheezes.   Heart:  normal rate, regular rhythm, no murmur, and no gallop.   Extremities:  no  edema Psych:  normally interactive, good eye contact, not anxious appearing, and not depressed appearing.     Impression & Recommendations:  Problem # 1:  ALLERGIC RHINITIS (ICD-477.9) Assessment Deteriorated will try OTC eye drops and add singulair  Her updated medication list for this problem includes:    Fexofenadine Hcl 180 Mg Tabs (Fexofenadine hcl) .Marland Kitchen... 1 tab daily as needed for allergies    Cetirizine Hcl 10 Mg Tabs (Cetirizine hcl) .Marland Kitchen... 1 tab up to two times a day for allergies  Problem # 2:  HYPERTENSION (ICD-401.9) Assessment: Improved  much better no problems on the meds will check labs  Her updated medication list for this problem includes:    Bisoprolol-hydrochlorothiazide 5-6.25 Mg Tabs (Bisoprolol-hydrochlorothiazide) .Marland Kitchen... 1 tab daily for high blood pressure    Triamterene-hctz 37.5-25 Mg Tabs (Triamterene-hctz) .Marland Kitchen... 1 tab daily  BP today: 158/90 Prior BP: 158/98 (05/02/2010)  Labs Reviewed: K+: 3.5 (10/18/2009) Creat: : 0.9 (10/18/2009)   Chol: 164 (09/13/2009)   HDL: 84.50 (09/13/2009)   LDL: 62 (09/13/2009)   TG: 87.0 (09/13/2009)  Orders: TLB-Renal Function Panel (80069-RENAL) TLB-CBC Platelet - w/Differential (85025-CBCD) TLB-Hepatic/Liver Function Pnl (80076-HEPATIC) TLB-TSH (Thyroid Stimulating Hormone) (84443-TSH) Venipuncture (16[address])  Problem # 3:  COPD (ICD-496) Assessment: Unchanged  doesn't seem active If cough persists, may need to reevaluate this  Her updated medication list for this problem includes:    Singulair 10 Mg Tabs (Montelukast sodium) .Marland Kitchen... 1 tab daily for severe resistant allergy symptoms  Complete Medication List: 1)  Bisoprolol-hydrochlorothiazide 5-6.25 Mg Tabs (Bisoprolol-hydrochlorothiazide) .Marland Kitchen.. 1 tab daily for high blood pressure 2)  Triamterene-hctz 37.5-25 Mg Tabs (Triamterene-hctz) .Marland Kitchen.. 1 tab daily 3)  Vicodin Tabs (Hydrocodone-acetaminophen tabs) .... Take 1 tablet by mouth two times a day prn 4)  Omeprazole  20 Mg Cpdr (Omeprazole) .Marland Kitchen.. 1 two times a day 5)  Pravastatin Sodium 80 Mg Tabs (Pravastatin sodium) .... Take one tablet by mouth daily at bedtime 6)  Fluoxetine Hcl 20 Mg Tabs (Fluoxetine hcl) .Marland Kitchen.. 1 tab daily 7)  Caltrate 600 1500 Mg Tabs (Calcium carbonate) .... Take 1 by mouth once daily 8)  Fexofenadine Hcl 180 Mg Tabs (Fexofenadine hcl) .Marland Kitchen.. 1 tab daily as needed for allergies 9)  Cetirizine Hcl 10 Mg Tabs (Cetirizine hcl) .Marland Kitchen.. 1 tab up to two times a day for allergies 10)  Vitamin B-12 1000 Mcg Tabs (Cyanocobalamin) .Marland Kitchen.. 1 tablet 3 times a week 11)  Multivitamins Tabs (Multiple vitamin) .... Take 1 tablet by mouth once a day 12)  Naproxen Sodium 220 Mg Tabs (Naproxen sodium) .Marland Kitchen.. 1-2 tabs by mouth two times a day as needed for arthritis pain 13)  Adult Aspirin Low Strength 81 Mg Tbdp (Aspirin) .... Take 1 tablet by mouth once a day 14)  Singulair 10 Mg Tabs (Montelukast sodium) .Marland Kitchen.. 1 tab daily for severe resistant allergy symptoms  Patient Instructions: 1)  Please schedule a follow-up appointment in 4 months .  Prescriptions: BISOPROLOL-HYDROCHLOROTHIAZIDE 5-6.25 MG TABS (BISOPROLOL-HYDROCHLOROTHIAZIDE) 1 tab daily for high blood pressure  #90 x 3   Entered and Authorized by:   Gerlene Burdock  Dia Crawford MD   Signed by:   Cindee Salt MD on 05/28/2010   Method used:   Electronically to        [facility]* (retail)       [address]       [place], [place]  [address]       Ph: [phone]       Fax: [phone]   RxID:   [id number] SINGULAIR 10 MG TABS (MONTELUKAST SODIUM) 1 tab daily for severe resistant allergy symptoms  #30 x 11   Entered and Authorized by:   Cindee Salt MD   Signed by:   Cindee Salt MD on 05/28/2010   Method used:   Electronically to        [facility]* (retail)       [address]       [place], [place]  [address]       Ph: [phone]       Fax: [phone]   RxID:   [id number]    Orders Added: 1)  TLB-Renal Function  Panel [80069-RENAL] 2)  TLB-CBC Platelet - w/Differential [85025-CBCD] 3)  TLB-Hepatic/Liver Function Pnl [80076-HEPATIC] 4)  TLB-TSH (Thyroid Stimulating Hormone) [84443-TSH] 5)  Venipuncture [40086] 6)  Est. Patient Level IV [76[address]]    Current Allergies (reviewed today): ! KEFLEX ! BIAXIN ! TRAMADOL HCL ! KEFLEX (CEPHALEXIN) ! BIAXIN (CLARITHROMYCIN) ! NIFEDIPINE (NIFEDIPINE) ! ACE INHIBITORS ! BENICAR (OLMESARTAN MEDOXOMIL) ! AMOXICILLIN (AMOXICILLIN) ! AUGMENTIN

## 2010-08-14 NOTE — Assessment & Plan Note (Signed)
Summary: 6 M F/U DLO R/S FROM 03/28/10   Vital Signs:  Patient profile:   77 year old female Height:      67 inches Weight:      164.50 pounds BMI:     25.86 Temp:     98.5 degrees F oral Pulse rate:   84 / minute Pulse rhythm:   regular BP sitting:   158 / 100  (left arm) Cuff size:   large  Vitals Entered By: Lewanda Rife LPN (March 14, 2010 10:42 AM) CC: six moht f/u   History of Present Illness: DOing well Up to walking a mile every other day--uses cane for stability No falls since she has left off glasses when walking outside Has changed mile pace from 35 minutes to 20  Arthritis is actually much better with increased walking less analgesics needed  Had reassuring stress test after my last visit Has noticed that gas-x relieves her chest pain when she gets it feels it may be related to stress---which is not bad now  Still checks BP at home Usually 135-140/80's No headaches No edema  Breathing is pretty good still some dyspnea but only if she presses too much Off spiriva now stopped the flonase as it made her cough  Allergies: 1)  ! Keflex 2)  ! Biaxin 3)  ! Tramadol Hcl 4)  ! * Ace in Hibitors 5)  ! Keflex (Cephalexin) 6)  ! Biaxin (Clarithromycin) 7)  ! Nifedipine (Nifedipine) 8)  ! Ace Inhibitors 9)  ! Benicar (Olmesartan Medoxomil) 10)  ! Amoxicillin (Amoxicillin) 11)  ! Augmentin  Past History:  Past medical, surgical, family and social histories (including risk factors) reviewed for relevance to current acute and chronic problems.  Past Medical History: Reviewed history from 12/18/2009 and no changes required. 1. COPD: Actually quit tobacco in the 1970s.  PFTs (1/11) with mild obstructive lung disease (FEV1 65% and    FEV1/FVC ratio mildly decreased).   2. Depression 3. GERD (Dr. Lottie Mussel) 4. Hyperlipidemia 5. Hypertension 6. Osteoarthritis (Dr Otelia Sergeant  [phone]): s/p right knee arthroscopy with partial medial and lateral meniscectomies;      chondroplastic shaving, lateral portion of the lateral femoral condyle May 2009 7. Osteoporosis 8. Breast cancer, hx of (Dr Lemar Livings): mastectomy in 1980s.     - Removal right implant 06/12/09 9. Carotid stenosis, mild. Gets yearly dopplers.  10. Spinal stenosis s/p laminectomy in 2007.  11. ACEI cough 12. Adenosine myoview (2007): EF 74%, no ischemia or infarction.  13. Angioedema with Augmentin in 12/10 requiring intubation and complicated by aspiration pneumonitis.  14. Echo (1/11): EF 60-65%, mild LVH, RV and valves without significant abnormality.  15.Allergic rhinitis  Past Surgical History: Reviewed history from 06/18/2009 and no changes required. 1987  R mastectomy 1988 Reconstruction and L reduction mammoplasty Mastoid--childhood Appendectomy--1948 2003--lumbar laminectomy 8/06   Fracture of L & R shoulders----multiple surgeries 06/12/09 Removal of right implant under general anesthesia  Family History: Reviewed history from 07/19/2009 and no changes required. Dad died @69 , had MI in his late 42s Mom died @72   ovarian cancer 1 brother died of lung cancer No DM Arthritis in family  Social History: Reviewed history from 07/19/2009 and no changes required. 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  Has living will. Son Virl Diamond is health care POA.  Requests DNR  Review of Systems       appetite is fine weight stable sleeps well  in general  Physical Exam  General:  alert and normal appearance.   Neck:  supple, no masses, no thyromegaly, no carotid bruits, and no cervical lymphadenopathy.   Lungs:  normal respiratory effort, no intercostal retractions, no accessory muscle use, normal breath sounds, no crackles, and no wheezes.   Heart:  normal rate, regular rhythm, no murmur, and no gallop.   Msk:  no joint tenderness and no joint swelling.   Extremities:  no edema Psych:  normally  interactive, good eye contact, not anxious appearing, and not depressed appearing.     Impression & Recommendations:  Problem # 1:  HYPERTENSION (ICD-401.9) Assessment Comment Only repeat 148/96 seems to have white coat component no changes for now  Her updated medication list for this problem includes:    Triamterene-hctz 37.5-25 Mg Tabs (Triamterene-hctz) .Marland Kitchen... 1 tab daily  BP today: 158/100 Prior BP: 150/90 (12/18/2009)  Labs Reviewed: K+: 3.5 (10/18/2009) Creat: : 0.9 (10/18/2009)   Chol: 164 (09/13/2009)   HDL: 84.50 (09/13/2009)   LDL: 62 (09/13/2009)   TG: 87.0 (09/13/2009)  Problem # 2:  DYSPNEA/SHORTNESS OF BREATH (ICD-786.09) Assessment: Improved seems to be improving from severe episode with ARDS  no meds now  The following medications were removed from the medication list:    Spiriva Handihaler 18 Mcg Caps (Tiotropium bromide monohydrate) .Marland Kitchen... 1 capsule inhaled daily as needed Her updated medication list for this problem includes:    Triamterene-hctz 37.5-25 Mg Tabs (Triamterene-hctz) .Marland Kitchen... 1 tab daily  Problem # 3:  OSTEOARTHRITIS (ICD-715.90) Assessment: Unchanged good control has been able to cut down clearly better with increased exercise  Her updated medication list for this problem includes:    Vicodin Tabs (Hydrocodone-acetaminophen tabs) .Marland Kitchen... Take 1 tablet by mouth two times a day prn    Adult Aspirin Low Strength 81 Mg Tbdp (Aspirin) .Marland Kitchen... Take 1 tablet by mouth once a day    Naprelan 375 Mg Xr24h-tab (Naproxen sodium) .Marland Kitchen... Take 1 by mouth two times a day as needed  Problem # 4:  ALLERGIC RHINITIS (ICD-477.9) Assessment: Unchanged okay with just oral meds  The following medications were removed from the medication list:    Fluticasone Propionate 50 Mcg/act Susp (Fluticasone propionate) .Marland Kitchen... 2 sprays in each nostril daily for allergy symptoms Her updated medication list for this problem includes:    Fexofenadine Hcl 180 Mg Tabs (Fexofenadine  hcl) .Marland Kitchen... 1 tab daily as needed for allergies    Cetirizine Hcl 10 Mg Tabs (Cetirizine hcl) .Marland Kitchen... 1 tab up to two times a day for allergies  Complete Medication List: 1)  Triamterene-hctz 37.5-25 Mg Tabs (Triamterene-hctz) .Marland Kitchen.. 1 tab daily 2)  Boniva Tabs (Ibandronate sodium tabs) .... Monthly 3)  Vicodin Tabs (Hydrocodone-acetaminophen tabs) .... Take 1 tablet by mouth two times a day prn 4)  Adult Aspirin Low Strength 81 Mg Tbdp (Aspirin) .... Take 1 tablet by mouth once a day 5)  Omeprazole 20 Mg Cpdr (Omeprazole) .Marland Kitchen.. 1 two times a day 6)  Pravastatin Sodium 80 Mg Tabs (Pravastatin sodium) .... Take one tablet by mouth daily at bedtime 7)  Naprelan 375 Mg Xr24h-tab (Naproxen sodium) .... Take 1 by mouth two times a day as needed 8)  Fluoxetine Hcl 20 Mg Tabs (Fluoxetine hcl) .Marland Kitchen.. 1 tab daily 9)  Caltrate 600 1500 Mg Tabs (Calcium carbonate) .... Take 1 by mouth once daily 10)  Fexofenadine Hcl 180 Mg Tabs (Fexofenadine hcl) .Marland Kitchen.. 1 tab daily as needed for allergies 11)  Cetirizine Hcl 10 Mg Tabs (  Cetirizine hcl) .Marland Kitchen.. 1 tab up to two times a day for allergies 12)  Vitamin B-12 1000 Mcg Tabs (Cyanocobalamin) .Marland Kitchen.. 1 tablet 3 times a week 13)  Multivitamins Tabs (Multiple vitamin) .... Take 1 tablet by mouth once a day  Patient Instructions: 1)  Please schedule a follow-up appointment in 6 months .  Prescriptions: PRAVASTATIN SODIUM 80 MG TABS (PRAVASTATIN SODIUM) Take one tablet by mouth daily at bedtime  #90 x 3   Entered and Authorized by:   Cindee Salt MD   Signed by:   Cindee Salt MD on 03/14/2010   Method used:   Faxed to ...       [facility] (mail-order)             , [place]         Ph: [phone]       Fax: [phone]   RxID:   [id number]   Current Allergies (reviewed today): ! [facility] ! BIAXIN ! TRAMADOL HCL ! * ACE IN HIBITORS ! KEFLEX (CEPHALEXIN) ! BIAXIN (CLARITHROMYCIN) ! NIFEDIPINE (NIFEDIPINE) ! ACE INHIBITORS ! BENICAR (OLMESARTAN MEDOXOMIL) !  AMOXICILLIN (AMOXICILLIN) ! AUGMENTIN  Appended Document: 8 M F/U DLO R/S FROM 03/28/10    Clinical Lists Changes  Orders: Added new Service order of Flu Vaccine 72yrs + (34742) - Signed Added new Service order of Administration Flu vaccine - MCR (V9563) - Signed Observations: Added new observation of FLU VAX VIS: 02/04/2010 version (03/14/2010 11:37) Added new observation of FLU VAXLOT: [id number] (03/14/2010 11:37) Added new observation of FLU VAXMFR: Glaxosmithkline (03/14/2010 11:37) Added new observation of FLU VAX EXP: 01/10/2011 (03/14/2010 11:37) Added new observation of FLU VAX DSE: 0.56ml (03/14/2010 11:37) Added new observation of FLU VAX: Fluvax 3+ (03/14/2010 11:37)       Flu Vaccine Consent Questions     Do you have a history of severe allergic reactions to this vaccine? no    Any prior history of allergic reactions to egg and/or gelatin? no    Do you have a sensitivity to the preservative Thimersol? no    Do you have a past history of Guillan-Barre Syndrome? no    Do you currently have an acute febrile illness? no    Have you ever had a severe reaction to latex? no    Vaccine information given and explained to patient? yes    Are you currently pregnant? no    Lot Number:[id number]   Exp Date:01/10/2011   Site Given  Left Deltoid IMmedflu Lewanda Rife LPN  March 14, 2010 11:41 AM

## 2010-08-14 NOTE — Progress Notes (Signed)
Summary: schedule MV  Phone Note Outgoing Call   Summary of Call: Left message to call to schedule MV. Charlena Cross RN BSN   Follow-up for Phone Call        pt aware and would like to proceed with MV.   Follow-up by: Charlena Cross, RN, BSN,  October 29, 2009 10:01 AM

## 2010-08-14 NOTE — Assessment & Plan Note (Signed)
Summary: F2W/AMD   Visit Type:  Follow-up Referring Provider:  Laurita Quint Primary Provider:  Cindee Salt MD  CC:  SOB and swelling in both ankles.  History of Present Illness: 77 yo with history of HTN, COPD, and diabetes returns for evaluation of exertional shortness of breath. Patient has had mild dyspnea for many years.  In 12/10, she was admitted to [facility] with an anaphylactic reaction to Augmentin.  She required intubation due to upper airways obstruction.  She developed aspiration pneumonitis.  Since discharge, she has noted increased exertional dyspnea.  Lasix did not help her symptoms.  Since I last saw her, she has had slow improvement.  She is back at work a couple of days a week.  Now she is short of breath after walking about a block or going up steps.  orthopnea or PND.  BP is better with increased Procardia XL and this did not worsen her BP control.  She gets edema in her ankles over the course of the day but it is gone when she wakes up in the morning.    labs (1/11): K 4.4, creatinine 0.7, HCT 40, BNP 35 labs (10/10): LDL 121, HDL 74  CXR (1/11): Left basilar airspace disease, atelectasis vs PNA   Current Medications (verified): 1)  Boniva   Tabs (Ibandronate Sodium Tabs) .... Monthly 2)  Procardia Xl 90 Mg Xr24h-Tab (Nifedipine) .Marland Kitchen.. 1 Tab By Mouth Daily 3)  Oscal 500/200 D-3 500-200 Mg-Unit  Tabs (Calcium-Vitamin D) .... 2 Daily 4)  Multivitamins   Tabs (Multiple Vitamin) .... Take 1 Tablet By Mouth Once A Day 5)  Vicodin   Tabs (Hydrocodone-Acetaminophen Tabs) .... Take 1 Tablet By Mouth Two Times A Day Prn 6)  Adult Aspirin Low Strength 81 Mg  Tbdp (Aspirin) .... Take 1 Tablet By Mouth Once A Day 7)  Omeprazole 20 Mg  Cpdr (Omeprazole) .Marland Kitchen.. 1 Two Times A Day 8)  Pravastatin Sodium 80 Mg Tabs (Pravastatin Sodium) .... Take One Tablet By Mouth Daily At Bedtime 9)  Naprelan 375 Mg Xr24h-Tab (Naproxen Sodium) .... Take 1 By Mouth Two Times A Day As  Needed 10)  Fluoxetine Hcl 20 Mg Tabs (Fluoxetine Hcl) .Marland Kitchen.. 1 Tab Daily 11)  Vitamin B-12 1000 Mcg Tabs (Cyanocobalamin) .Marland Kitchen.. 1 Tablet 3 Times A Week  Allergies (verified): 1)  ! Keflex 2)  ! Biaxin 3)  ! Tramadol Hcl 4)  ! * Ace in Hibitors 5)  ! Keflex (Cephalexin) 6)  ! Biaxin (Clarithromycin) 7)  ! Nifedipine (Nifedipine) 8)  ! Ace Inhibitors 9)  ! Benicar (Olmesartan Medoxomil) 10)  ! Amoxicillin (Amoxicillin)  Past History:  Past Medical History: 1. COPD: Actually quit tobacco in the 1970s.  PFTs (1/11) with mild obstructive lung disease (FEV1 65% and FEV1/FVC ratio mildly decreased).   2. Depression 3. GERD (Dr. Lottie Mussel) 4. Hyperlipidemia 5. Hypertension 6. Osteoarthritis (Dr Otelia Sergeant  [phone]): s/p right knee arthroscopy with partial medial and lateral meniscectomies; chondroplastic shaving, lateral portion of the lateral femoral condyle May 2009 7. Osteoporosis 8. Breast cancer, hx of (Dr Lemar Livings): mastectomy in 1980s.     - Removal right implant 06/12/09 9. Carotid stenosis, mild. Gets yearly dopplers.  10. Spinal stenosis s/p laminectomy in 2007.  11. ACEI cough 12. Adenosine myoview (2007): EF 74%, no ischemia or infarction.  13. Angioedema with Augmentin in 12/10 requiring intubation and complicated by aspiration pneumonitis.  14. Echo (1/11): EF 60-65%, mild LVH, RV and valves without significant abnormality.   Family  History: Reviewed history from 07/19/2009 and no changes required. Dad died @75 , had MI in his late 34s Mom died @72   ovarian cancer 1 brother died of lung cancer No DM Arthritis in family  Social History: Reviewed history from 07/19/2009 and no changes required. 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  Has living will. Son Virl Diamond is health care POA.  Requests DNR  Review of Systems       All systems reviewed and negative except as per HPI.   Vital  Signs:  Patient profile:   77 year old female Height:      67 inches Weight:      168 pounds BMI:     26.41 Pulse rate:   74 / minute Pulse rhythm:   regular BP sitting:   124 / 74  (left arm) Cuff size:   regular  Vitals Entered By: Mercer Pod (August 09, 2009 1:59 PM)  Physical Exam  General:  Well developed, well nourished, in no acute distress. Neck:  Neck supple, no JVD. No masses, thyromegaly or abnormal cervical nodes. Lungs:  Very slight crackles at the left base.  Heart:  Non-displaced PMI, chest non-tender; regular rate and rhythm, S1, S2 without murmurs, rubs or gallops. Carotid upstroke normal, no bruit.  Pedals normal pulses. Trace ankle edema.  Abdomen:  Bowel sounds positive; abdomen soft and non-tender without masses, organomegaly, or hernias noted. No hepatosplenomegaly. Extremities:  No clubbing or cyanosis. Neurologic:  Alert and oriented x 3. Psych:  Normal affect.   Impression & Recommendations:  Problem # 1:  SHORTNESS OF BREATH (ICD-786.05) Gradually improving. BNP was normal, she is not volume overloaded on exam, and echo showed no significant abnormality.  PFTs were suggestive of mild COPD.  I think that her symptoms may be post-aspiration pneumonitis.  This fits the gradual improvement with time.  I will give her a prescription for Spiriva to see if this helps given her mild obstructive lung disease.  She will need a CXR in about 6 wks to make sure that she has resolution of probable atelectasis at the left base.   Problem # 2:  CAROTID ARTERY STENOSIS (ICD-433.10) Carotid dopplers needed.   Other Orders: Carotid Duplex (Carotid Duplex) Future Orders: T-2 View CXR (71020TC) ... 09/16/2009  Patient Instructions: 1)  Your physician recommends that you schedule a follow-up appointment in: 6 months 2)  Your physician has recommended you make the following change in your medication: start spiriva daily 3)  A chest x-ray takes a picture of the  organs and structures inside the chest, including the heart, lungs, and blood vessels. This test can show several things, including, whether the heart is enlarged; whether fluid is building up in the lungs; and whether pacemaker / defibrillator leads are still in place. in 6 weeks.  We will call to schedule this. 4)  Your physician has requested that you have a carotid duplex. This test is an ultrasound of the carotid arteries in your neck. It looks at blood flow through these arteries that supply the brain with blood. Allow one hour for this exam. There are no restrictions or special instructions. Prescriptions: SPIRIVA HANDIHALER 18 MCG CAPS (TIOTROPIUM BROMIDE MONOHYDRATE) 1 capsule inhaled daily  #30 x 6   Entered by:   Charlena Cross, RN, BSN   Authorized by:   Marca Ancona, MD   Signed by:   Charlena Cross, RN, BSN on 08/09/2009  Method used:   Electronically to        [facility]* (retail)       [address]       [place], [place]  [address]       Ph: [phone]       Fax: [phone]   RxID:   [id number]

## 2010-08-14 NOTE — Progress Notes (Signed)
Summary: regarding lasix  Phone Note Call from Patient Call back at Home Phone [phone]   [name]: Patient Summary of Call: Pt was seen a couple of weeks ago for a hospital follow up for an allergic reaction.  She was given 30 lasix, which is all Dr. Alphonsus Sias thought that she would need, but she is still having swelling in her feet and ankles.  She says she only takes this when she absolutely has to.  She is also having SOB, feels very tired and sleepy. States she always has a problem with low potassium as well.    Please advise, [place] [facility]   Initial call taken by: Lowella Petties CMA,  July 15, 2009 3:44 PM  Follow-up for Phone Call        She needs to be seen tonight or tomorrow. Follow-up by: Ruthe Mannan MD,  July 15, 2009 3:47 PM  Additional Follow-up for Phone Call Additional follow up Details #1::        Patient Advised.  Appointment scheduled  07/16/09 with Dr. Hetty Ely at 2:30 p.m.  Had a.m. appt. available but patient requested a afternoon appt.  Says she is hesitant to see another MD but Dr. Alphonsus Sias is away all week. Additional Follow-up by: Delilah Shan CMA (AAMA),  July 15, 2009 4:24 PM

## 2010-08-14 NOTE — Progress Notes (Signed)
Summary: Allergies  Phone Note Call from Patient Call back at Home Phone [phone]   [name]: Patient Call For: Cindee Salt MD Summary of Call: Patient says she is having a severe attack of allergies.  She wanted to come in but you have no appts. until Monday and she does not want to see anyone else.  She is coughing up clear mucous, eyes swollen, nose runny.  She has not been able to sleep for the past couple of nights.  She has taken Allegra and Cetirizine Hydrochloride OTC to no avail.  Can you suggest something else or prescribe something?  She says she will still make the appt. for Monday if you like but she needs some relief before then. Initial call taken by: Delilah Shan CMA Duncan Dull),  December 17, 2009 8:43 AM  Follow-up for Phone Call        have her try the cetirizine two times a day and take the allegra the same day can add at end of AM tomorrow as needed  Follow-up by: Cindee Salt MD,  December 17, 2009 9:02 AM  Additional Follow-up for Phone Call Additional follow up Details #1::        Spoke with patient and advised results. Appt made Additional Follow-up by: Mervin Hack CMA [facility]),  December 17, 2009 9:20 AM

## 2010-08-14 NOTE — Progress Notes (Signed)
Summary: needs order for breast prosthesis  Phone Note Call from Patient Call back at Home Phone [phone]   [name]: Patient Call For: Cindee Salt MD Summary of Call: Pt needs script for breast prosthesis so that insurance will pay.  Call when  ready and she will pick up. Initial call taken by: Lowella Petties CMA,  Nov 29, 2009 5:31 PM  Follow-up for Phone Call        order written Follow-up by: Cindee Salt MD,  Dec 02, 2009 1:49 PM  Additional Follow-up for Phone Call Additional follow up Details #1::        Spoke with patient and advised rx ready for pick-up  Additional Follow-up by: Mervin Hack CMA [facility] Dull),  Dec 02, 2009 2:58 PM

## 2010-08-14 NOTE — Letter (Signed)
Summary: [facility]  [facility]   Imported By: Lanelle Bal 09/11/2009 10:48:01  _____________________________________________________________________  External Attachment:    Type:   Image     Comment:   External Document  Appended Document: [facility] requests annual left  breast mammos by me and follow up as needed only

## 2010-08-14 NOTE — Assessment & Plan Note (Signed)
Summary: bp running high/hmw   Vital Signs:  Patient profile:   77 year old female Weight:      166 pounds Temp:     98.0 degrees F oral Pulse rate:   92 / minute Pulse rhythm:   regular BP sitting:   158 / 98  (left arm) Cuff size:   regular  Vitals Entered By: Mervin Hack CMA Duncan Dull) (May 02, 2010 11:47 AM) CC: elevated blood pressure   History of Present Illness: Blood pressure is very high 1st was 235/120 then down to 179/115  Has taken two of the diuretics Using wrist cuff and it doesn't seem to be correct (running very low)  Has AM headaches--typical for her with allergy season No chest pain No SOB No sig vision changes  still exercising  Has been on naproxyn on ongoing basis No decongestants  Allergies: 1)  ! Keflex 2)  ! Biaxin 3)  ! Tramadol Hcl 4)  ! Keflex (Cephalexin) 5)  ! Biaxin (Clarithromycin) 6)  ! Nifedipine (Nifedipine) 7)  ! Ace Inhibitors 8)  ! Benicar (Olmesartan Medoxomil) 9)  ! Amoxicillin (Amoxicillin) 10)  ! Augmentin  Past History:  Past medical, surgical, family and social histories (including risk factors) reviewed for relevance to current acute and chronic problems.  Past Medical History: Reviewed history from 12/18/2009 and no changes required. 1. COPD: Actually quit tobacco in the 1970s.  PFTs (1/11) with mild obstructive lung disease (FEV1 65% and    FEV1/FVC ratio mildly decreased).   2. Depression 3. GERD (Dr. Lottie Mussel) 4. Hyperlipidemia 5. Hypertension 6. Osteoarthritis (Dr Otelia Sergeant  [phone]): s/p right knee arthroscopy with partial medial and lateral meniscectomies;     chondroplastic shaving, lateral portion of the lateral femoral condyle May 2009 7. Osteoporosis 8. Breast cancer, hx of (Dr Lemar Livings): mastectomy in 1980s.     - Removal right implant 06/12/09 9. Carotid stenosis, mild. Gets yearly dopplers.  10. Spinal stenosis s/p laminectomy in 2007.  11. ACEI cough 12. Adenosine myoview (2007): EF 74%, no  ischemia or infarction.  13. Angioedema with Augmentin in 12/10 requiring intubation and complicated by aspiration pneumonitis.  14. Echo (1/11): EF 60-65%, mild LVH, RV and valves without significant abnormality.  15.Allergic rhinitis  Past Surgical History: Reviewed history from 06/18/2009 and no changes required. 1987  R mastectomy 1988 Reconstruction and L reduction mammoplasty Mastoid--childhood Appendectomy--1948 2003--lumbar laminectomy 8/06   Fracture of L & R shoulders----multiple surgeries 06/12/09 Removal of right implant under general anesthesia  Family History: Reviewed history from 07/19/2009 and no changes required. Dad died @69 , had MI in his late 24s Mom died @72   ovarian cancer 1 brother died of lung cancer No DM Arthritis in family  Social History: Reviewed history from 07/19/2009 and no changes required. 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  Has living will. Son Virl Diamond is health care POA.  Requests DNR  Review of Systems       weight is stable sleeps okay most nights  Physical Exam  General:  alert and normal appearance.   Neck:  supple, no masses, no thyromegaly, no carotid bruits, and no cervical lymphadenopathy.   Lungs:  normal respiratory effort, no intercostal retractions, no accessory muscle use, and normal breath sounds.   Heart:  normal rate, regular rhythm, no murmur, and no gallop.   Extremities:  no edema Psych:  normally interactive, good eye contact, not anxious appearing, and not depressed appearing.  Impression & Recommendations:  Problem # 1:  HYPERTENSION (ICD-401.9) Assessment Deteriorated 164/114 by me on left on NSAID but this is not new  will add bisoprolol and recheck soon labs next time  Her updated medication list for this problem includes:    Triamterene-hctz 37.5-25 Mg Tabs (Triamterene-hctz) .Marland Kitchen... 1 tab daily     Bisoprolol-hydrochlorothiazide 5-6.25 Mg Tabs (Bisoprolol-hydrochlorothiazide) .Marland Kitchen... 1 tab daily for high blood pressure  Problem # 2:  OSTEOARTHRITIS (ICD-715.90) Assessment: Comment Only does okay on the naprelan but it costs $150/3 months will change to OTC  The following medications were removed from the medication list:    Naprelan 375 Mg Xr24h-tab (Naproxen sodium) .Marland Kitchen... Take 1 by mouth two times a day as needed Her updated medication list for this problem includes:    Vicodin Tabs (Hydrocodone-acetaminophen tabs) .Marland Kitchen... Take 1 tablet by mouth two times a day prn    Adult Aspirin Low Strength 81 Mg Tbdp (Aspirin) .Marland Kitchen... Take 1 tablet by mouth once a day    Naproxen Sodium 220 Mg Tabs (Naproxen sodium) .Marland Kitchen... 1-2 tabs by mouth two times a day as needed for arthritis pain  Complete Medication List: 1)  Triamterene-hctz 37.5-25 Mg Tabs (Triamterene-hctz) .Marland Kitchen.. 1 tab daily 2)  Vicodin Tabs (Hydrocodone-acetaminophen tabs) .... Take 1 tablet by mouth two times a day prn 3)  Adult Aspirin Low Strength 81 Mg Tbdp (Aspirin) .... Take 1 tablet by mouth once a day 4)  Omeprazole 20 Mg Cpdr (Omeprazole) .Marland Kitchen.. 1 two times a day 5)  Pravastatin Sodium 80 Mg Tabs (Pravastatin sodium) .... Take one tablet by mouth daily at bedtime 6)  Fluoxetine Hcl 20 Mg Tabs (Fluoxetine hcl) .Marland Kitchen.. 1 tab daily 7)  Caltrate 600 1500 Mg Tabs (Calcium carbonate) .... Take 1 by mouth once daily 8)  Fexofenadine Hcl 180 Mg Tabs (Fexofenadine hcl) .Marland Kitchen.. 1 tab daily as needed for allergies 9)  Cetirizine Hcl 10 Mg Tabs (Cetirizine hcl) .Marland Kitchen.. 1 tab up to two times a day for allergies 10)  Vitamin B-12 1000 Mcg Tabs (Cyanocobalamin) .Marland Kitchen.. 1 tablet 3 times a week 11)  Multivitamins Tabs (Multiple vitamin) .... Take 1 tablet by mouth once a day 12)  Bisoprolol-hydrochlorothiazide 5-6.25 Mg Tabs (Bisoprolol-hydrochlorothiazide) .Marland Kitchen.. 1 tab daily for high blood pressure 13)  Naproxen Sodium 220 Mg Tabs (Naproxen sodium) .Marland Kitchen.. 1-2 tabs by  mouth two times a day as needed for arthritis pain  Patient Instructions: 1)  Please schedule a follow-up appointment in 1 month.  Prescriptions: BISOPROLOL-HYDROCHLOROTHIAZIDE 5-6.25 MG TABS (BISOPROLOL-HYDROCHLOROTHIAZIDE) 1 tab daily for high blood pressure  #30 x 11   Entered and Authorized by:   Cindee Salt MD   Signed by:   Cindee Salt MD on 05/02/2010   Method used:   Electronically to        [facility]* (retail)       [address]       [place], [place]  [address]       Ph: [phone]       Fax: [phone]   RxID:   [id number]    Orders Added: 1)  Est. Patient Level III [84132]    Current Allergies (reviewed today): ! [facility] ! BIAXIN ! TRAMADOL HCL ! KEFLEX (CEPHALEXIN) ! BIAXIN (CLARITHROMYCIN) ! NIFEDIPINE (NIFEDIPINE) ! ACE INHIBITORS ! BENICAR (OLMESARTAN MEDOXOMIL) ! AMOXICILLIN (AMOXICILLIN) ! AUGMENTIN

## 2010-08-14 NOTE — Assessment & Plan Note (Signed)
Summary: LEG SWOLLEN/DLO   Vital Signs:  Patient profile:   77 year old female Weight:      166 pounds Temp:     98.4 degrees F oral Pulse rate:   88 / minute Pulse rhythm:   regular BP sitting:   128 / 70  (left arm) Cuff size:   regular  Vitals Entered By: Mervin Hack CMA Duncan Dull) (August 06, 2009 9:27 AM) CC: legs swollen   History of Present Illness: Saw Dr Hetty Ely then Dr Shirlee Latch procardia and pravastatin increased Echo showed low grade diastolic dysfunction but normal LV function BNP normal PFTs pending  noted some swelling along medial left calf and she got worried Now it seems better Not using lasix but does note some edema by the end of the day  Still gets DOE better than 2 weeks ago but not back to her baseline   Allergies: 1)  ! Keflex 2)  ! Biaxin 3)  ! Tramadol Hcl 4)  ! * Ace in Hibitors 5)  ! Keflex (Cephalexin) 6)  ! Biaxin (Clarithromycin) 7)  ! Nifedipine (Nifedipine) 8)  ! Ace Inhibitors 9)  ! Benicar (Olmesartan Medoxomil) 10)  ! Amoxicillin (Amoxicillin)  Past History:  Past medical, surgical, family and social histories (including risk factors) reviewed for relevance to current acute and chronic problems.  Past Medical History: Reviewed history from 07/19/2009 and no changes required. 1. COPD: However, she quit tobacco in the 1970s and her PFTs looked "ok" about 4 years ago per her report.  2. Depression 3. GERD (Dr. Lottie Mussel) 4. Hyperlipidemia 5. Hypertension 6. Osteoarthritis (Dr Otelia Sergeant  [phone]): s/p right knee arthroscopy with partial medial and lateral meniscectomies; chondroplastic shaving, lateral portion of the lateral femoral condyle May 2009 7. Osteoporosis 8. Breast cancer, hx of (Dr Lemar Livings): mastectomy in 1980s.     - Removal right implant 06/12/09 9. Carotid stenosis, mild. Gets yearly dopplers.  10. Spinal stenosis s/p laminectomy in 2007.  11. ACEI cough 12. Adenosine myoview (2007): EF 74%, no ischemia or infarction.    12. Angioedema with Augmentin in 12/10 requiring intubation and complicated by aspiration pneumonitis.   Past Surgical History: Reviewed history from 06/18/2009 and no changes required. 1987  R mastectomy 1988 Reconstruction and L reduction mammoplasty Mastoid--childhood Appendectomy--1948 2003--lumbar laminectomy 8/06   Fracture of L & R shoulders----multiple surgeries 06/12/09 Removal of right implant under general anesthesia  Family History: Reviewed history from 07/19/2009 and no changes required. Dad died @69 , had MI in his late 19s Mom died @72   ovarian cancer 1 brother died of lung cancer No DM Arthritis in family  Social History: Reviewed history from 07/19/2009 and no changes required. 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  Has living will. Son Virl Diamond is health care POA.  Requests DNR  Review of Systems       sleeps fairly well Occ awakens in middle of night but no PND does have some nocturia---  2-3 now per night Appetite is coming back--still has some change in taste (still doesn't like coffee again)  Physical Exam  General:  alert.  NAD Neck:  supple, no masses, no carotid bruits, and no cervical lymphadenopathy.   Lungs:  normal respiratory effort, no intercostal retractions, no accessory muscle use, normal breath sounds, no crackles, and no wheezes.   Heart:  normal rate, regular rhythm, no murmur, no gallop, and no rub.   Extremities:  no sig edema No  calf tenderness Psych:  normally interactive, good eye contact, not anxious appearing, and not depressed appearing.     Impression & Recommendations:  Problem # 1:  SHORTNESS OF BREATH (ICD-786.05) Assessment Improved still with DOE but improved Echo okay This is probably pulmonary based on lung damage from anaphylactic reaction PFTs can confirm this ---esp if diffusing capacity sig down  no changes for  now  Problem # 2:  DEPENDENT EDEMA, LEGS (ICD-782.3) Assessment: Improved only using the lasix rarely  Her updated medication list for this problem includes:    Furosemide 40 Mg Tabs (Furosemide) .Marland Kitchen... 1 tab daily as needed for foot and leg swelling  Complete Medication List: 1)  Boniva Tabs (Ibandronate sodium tabs) .... Monthly 2)  Procardia Xl 90 Mg Xr24h-tab (Nifedipine) .Marland Kitchen.. 1 tab by mouth daily 3)  Oscal 500/200 D-3 500-200 Mg-unit Tabs (Calcium-vitamin d) .... 2 daily 4)  Multivitamins Tabs (Multiple vitamin) .... Take 1 tablet by mouth once a day 5)  Vicodin Tabs (Hydrocodone-acetaminophen tabs) .... Take 1 tablet by mouth two times a day prn 6)  Adult Aspirin Low Strength 81 Mg Tbdp (Aspirin) .... Take 1 tablet by mouth once a day 7)  Omeprazole 20 Mg Cpdr (Omeprazole) .Marland Kitchen.. 1 two times a day 8)  Pravastatin Sodium 80 Mg Tabs (Pravastatin sodium) .... Take one tablet by mouth daily at bedtime 9)  Naprelan 375 Mg Xr24h-tab (Naproxen sodium) .... Take 1 by mouth two times a day as needed 10)  Fluoxetine Hcl 20 Mg Tabs (Fluoxetine hcl) .Marland Kitchen.. 1 tab daily 11)  Furosemide 40 Mg Tabs (Furosemide) .Marland Kitchen.. 1 tab daily as needed for foot and leg swelling 12)  Vitamin B-12 1000 Mcg Tabs (Cyanocobalamin) .... 2 by mouth once daily  Patient Instructions: 1)  Please cancel Friday's appt 2)  Keep March appt  Current Allergies (reviewed today): ! [facility] ! BIAXIN ! TRAMADOL HCL ! * ACE IN HIBITORS ! KEFLEX (CEPHALEXIN) ! BIAXIN (CLARITHROMYCIN) ! NIFEDIPINE (NIFEDIPINE) ! ACE INHIBITORS ! BENICAR (OLMESARTAN MEDOXOMIL) ! AMOXICILLIN (AMOXICILLIN)

## 2010-08-14 NOTE — Letter (Signed)
[facility]   Imported By: Lanelle Bal 05/13/2010 14:47:04  _____________________________________________________________________  External Attachment:    Type:   Image     Comment:   External Document  Appended Document: [facility] stopping boniva BP was up 189/97--recommended follow up here

## 2010-08-14 NOTE — Progress Notes (Signed)
Summary: [facility]   Imported By: Mercer Pod 07/24/2009 12:39:55  _____________________________________________________________________  External Attachment:    Type:   Image     Comment:   External Document

## 2010-08-14 NOTE — Assessment & Plan Note (Signed)
Summary: NEW PT; FATIGUE   Visit Type:  [facility]  Referring Provider:  Laurita Quint Primary Provider:  Cindee Salt MD  CC:  occasional cp, d/c from [facility] the 13th of December. Pt states that she has just not felt right since then. The faster heart rate started a little before Decemeber. Edema in ankles and feet, if don't seat with ankles up all day long the ankles start to swell on her. She dosen't add salt to any of her foods. Sob with exertion, and any movement. .  History of Present Illness: 77 yo with history of HTN, COPD, and diabetes presents for evaluation of exertional shortness of breath. Patient has had mild dyspnea for many years.  In 12/10, she was admitted to [facility] with an anaphylactic reaction to Augmentin.  She required intubation due to upper airways obstruction.  She developed aspiration pneumonitis.  Since discharge, she has noted increased exertional dyspnea.  She is short of breath just walking around her house.  She can climb a flight of steps but gets short of breath in the process.  She has slept on 2 pillows over a long period of time for back pain. This does not seem to be orthopnea, per se.  No PND.  She wakes up about once a week at night with chest tightness shooting down her arms.  This lasts a few minutes.  She does not get chest pain during the day or with exertion.  She has had a cough and occasional wheezing since coming home from the hospital.  She has also had some ankle swelling since leaving the hospital. She was started on Lasix by Dr. Alphonsus Sias but does not think it helped her symptoms at all, and it did not do much for the swelling.  Therefore, she has not taken Lasix for over a week.  BP is high today at 160/90.    ECG: NSR, borderline left axis deviation, PVC  Labs (1/11): K 4.4, creatinine 0.7, HCT 40 labs (10/10): LDL 121, HDL 74  Preventive Screening-Counseling & Management  Alcohol-Tobacco     Alcohol drinks/day: 2  Smoking Status: quit     Year Quit: 1975  Caffeine-Diet-Exercise     Caffeine use/day: 3 cups of tea a day     Does Patient Exercise: yes  Current Medications (verified): 1)  Boniva   Tabs (Ibandronate Sodium Tabs) .... Monthly 2)  Procardia Xl 60 Mg  Tb24 (Nifedipine) .... Take 1 Tablet By Mouth Once A Day 3)  Oscal 500/200 D-3 500-200 Mg-Unit  Tabs (Calcium-Vitamin D) .... 2 Daily 4)  Multivitamins   Tabs (Multiple Vitamin) .... Take 1 Tablet By Mouth Once A Day 5)  Vicodin   Tabs (Hydrocodone-Acetaminophen Tabs) .... Take 1 Tablet By Mouth Two Times A Day Prn 6)  Adult Aspirin Low Strength 81 Mg  Tbdp (Aspirin) .... Take 1 Tablet By Mouth Once A Day 7)  Omeprazole 20 Mg  Cpdr (Omeprazole) .Marland Kitchen.. 1 Two Times A Day 8)  Pravastatin Sodium 40 Mg  Tabs (Pravastatin Sodium) .Marland Kitchen.. 1 Daily 9)  Naprelan 375 Mg Xr24h-Tab (Naproxen Sodium) .... Take 1 By Mouth Two Times A Day As Needed 10)  Fluoxetine Hcl 20 Mg Tabs (Fluoxetine Hcl) .Marland Kitchen.. 1 Tab Daily 11)  Furosemide 40 Mg Tabs (Furosemide) .Marland Kitchen.. 1 Tab Daily As Needed For Foot and Leg Swelling 12)  Vitamin B-12 1000 Mcg Tabs (Cyanocobalamin) .... 2 By Mouth Once Daily  Allergies (verified): 1)  ! Keflex 2)  !  Biaxin 3)  ! Tramadol Hcl 4)  ! * Ace in Hibitors 5)  ! Keflex (Cephalexin) 6)  ! Biaxin (Clarithromycin) 7)  ! Nifedipine (Nifedipine) 8)  ! Ace Inhibitors 9)  ! Benicar (Olmesartan Medoxomil) 10)  ! 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  Has living will. Son Virl Diamond is health care POA.  Requests DNR  Risk Factors: Alcohol Use: 2 (08/07/2009) Caffeine Use: 3 cups of tea a day (08-07-09) Exercise: yes (August 07, 2009)  Risk Factors: Smoking Status: quit (Aug 07, 2009)  Past Medical History: 1. COPD: However, she quit tobacco in the 1970s and her PFTs looked "ok" about 4 years ago per her report.  2. Depression 3. GERD (Dr. Lottie Mussel) 4. Hyperlipidemia 5. Hypertension 6. Osteoarthritis (Dr Otelia Sergeant  [phone]): s/p right knee arthroscopy with partial medial and lateral meniscectomies; chondroplastic shaving, lateral portion of the lateral femoral condyle May 2009 7. Osteoporosis 8. Breast cancer, hx of (Dr Lemar Livings): mastectomy in 1980s.     - Removal right implant 06/12/09 9. Carotid stenosis, mild. Gets yearly dopplers.  10. Spinal stenosis s/p laminectomy in 2007.  11. ACEI cough 12. Adenosine myoview (2007): EF 74%, no ischemia or infarction.  12. Angioedema with Augmentin in 12/10 requiring intubation and complicated by aspiration pneumonitis.   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  Has living will. Son Virl Diamond is health care POA.  Requests DNR Alcohol drinks/day:  2 Caffeine use/day:  3 cups of tea a day Does Patient Exercise:  yes  Review of Systems       All systems reviewed and negative except as per HPI.   Vital Signs:  Patient profile:   77 year old female Height:      67 inches Weight:      164.50 pounds BMI:     25.86 O2 Sat:      95 % on Room air Pulse rate:   104 / minute Pulse rhythm:   regular BP sitting:   160 / 90   (left arm) Cuff size:   regular  Vitals Entered By: Mercer Pod Aug 07, 2009 10:26 AM)  O2 Flow:  Room air  Physical Exam  General:  Well developed, well nourished, in no acute distress.  Head:  Normocephalic and atraumatic without obvious abnormalities. No apparent alopecia or balding. Nose:  External nasal examination shows no deformity or inflammation. Nasal mucosa are pink and moist without lesions or exudates. Mouth:  Oral mucosa and oropharynx without lesions or exudates.  Teeth in good repair. Neck:  Neck supple, no JVD. No masses, thyromegaly or abnormal cervical nodes. Lungs:  Slight crackles at the bases bilaterally.  Heart:  Non-displaced PMI, chest non-tender; regular rate and rhythm, S1, S2  without murmurs, rubs or gallops. Carotid upstroke normal, no bruit.  Pedals normal pulses. 1+ edema 1/3 up lower legs bilaterally.  Abdomen:  Bowel sounds positive; abdomen soft and non-tender without masses, organomegaly, or hernias noted. No hepatosplenomegaly. Msk:  Back normal, normal gait. Muscle strength and tone normal. Extremities:  No clubbing or cyanosis. Neurologic:  Alert and oriented x 3. Skin:  Intact without lesions or rashes. Psych:  Normal affect.   Impression & Recommendations:  Problem # 1:  SHORTNESS OF BREATH (ICD-786.05) This seems to have worsened considerably since her hospitalization with angioedema and aspiration pneumonitis.  Her O2 sat was 95% on room air today.  She does not appear significantly volume overloaded on exam to me (normal JVP, mild lower leg edema only).  She may be having some residual symptoms from her episode of presumed aspiration pneumonitis.  My initial impression is that the symptoms are more pulmonary.  - Echocardiogram to assess LV function and BNP.  - CXR, PFTs.  - Okay to use Lasix on a as needed basis only as it has not affected symptoms.   - May consider right heart catheterization in the future is we have difficulty  sorting out heart vs lungs.   Problem # 2:  CHEST PAIN (ICD-786.50) Very atypical, only at night and never exertional.  She had myoview in 2007 that was normal.  Continue ASA, do not think at this point that she needs a repeat stress.   Problem # 3:  CAROTID ARTERY STENOSIS (ICD-433.10) Continue ASA 81 mg daily.  Patient is due for carotid dopplers to monitor.  I will order this today.   Problem # 4:  HYPERLIPIDEMIA (ICD-272.4) I would like to see LDL at least < 100 with her vascular disease (carotid stenosis), so will increase pravastatin to 80 mg daily.  Check lipids/LFTs in 2 months.   Problem # 5:  HYPERTENSION (ICD-401.9) BP is running high.  Will have her increase Procardia XL to 90 mg daily.  She says she has been on this for years without ankle edema so I tend to doubt that this was the cause of her peripheral edema.  However, if it worsens with increased Procardia, we may need to change to another BP controlling agent.   Other Orders: T-2 View CXR (71020TC) T-BNP  (B Natriuretic Peptide) (16109-60454) Carotid Duplex (Carotid Duplex) Pulmonary Function Test (PFT) Echocardiogram (Echo)  Patient Instructions: 1)  Your physician recommends that you schedule a follow-up appointment in: 2 weeks 2)  Your physician recommends that you return for a FASTING lipid profile:  2 months (lipids. lfts) 3)  Your physician has recommended you make the following change in your medication: increase pravastatin to 80 mg daily, increase procardia xl to 90 mg daily  4)  Your physician has requested that you have a carotid duplex. This test is an ultrasound of the carotid arteries in your neck. It looks at blood flow through these arteries that supply the brain with blood. Allow one hour for this exam. There are no restrictions or special instructions. 5)  Your physician has requested that you have an echocardiogram.  Echocardiography is a painless test that uses sound waves to create images of your  heart. It provides your doctor with information about the size and shape of your heart and how well your heart's chambers and valves are working.  This procedure takes approximately one hour. There are no restrictions for this procedure. 6)  Your physician has recommended that you have a  pulmonary function test.  Pulmonary Function Tests are a group of tests that measure how well air moves in and out of your lungs. Jan 18,2011 at [facility].  Please arrive at 8:30 to medical mall entrance.  No inhalers the morning of.  No smoking for 1 hour prior to testing.  You may have a light breakfast.  7)  A chest x-ray takes a picture of the organs and structures inside the chest, including the heart, lungs, and blood vessels. This test can show several things, including, whether the heart is enlarged; whether fluid is building up in the lungs; and whether pacemaker / defibrillator leads are still in place. Prescriptions: PRAVASTATIN SODIUM 80 MG TABS (PRAVASTATIN SODIUM) Take one tablet by mouth daily at bedtime  #90 x 3   Entered by:   Charlena Cross, RN, BSN   Authorized by:   Marca Ancona, MD   Signed by:   Charlena Cross, RN, BSN on 07/19/2009   Method used:   Electronically to        [facility]* (mail-order)             ,          Ph: [phone]       Fax: [phone]   RxID:   [id number] PROCARDIA XL 90 MG XR24H-TAB (NIFEDIPINE) 1 tab by mouth daily  #90 x 3   Entered by:   Charlena Cross, RN, BSN   Authorized by:   Marca Ancona, MD   Signed by:   Charlena Cross, RN, BSN on 07/19/2009   Method used:   Electronically to        [facility]* (mail-order)             ,          Ph: [phone]       Fax: [phone]   RxID:   [id number] PRAVASTATIN SODIUM 80 MG TABS (PRAVASTATIN SODIUM) Take one tablet by mouth daily at bedtime  #30 x 0   Entered by:   Charlena Cross, RN, BSN   Authorized by:   Marca Ancona, MD   Signed by:   Charlena Cross, RN, BSN on  07/19/2009   Method used:   Electronically to        [facility]* (retail)       [address]       [place], [place]  [address]       Ph: [phone]       Fax: [phone]   RxID:   [id number] PROCARDIA XL 90 MG XR24H-TAB (NIFEDIPINE) 1 tab by mouth daily  #30 x 0   Entered by:   Charlena Cross, RN, BSN   Authorized by:   Marca Ancona, MD   Signed by:   Charlena Cross, RN, BSN on 07/19/2009   Method used:   Electronically to        [facility]* (retail)       [address]       [place], [place]  [address]       Ph: [phone]       Fax: [phone]   RxID:   [id number]

## 2010-08-14 NOTE — Progress Notes (Signed)
Summary: Nuclear Pre-Procedure  Phone Note Outgoing Call   Call placed by: Milana Na, EMT-P,  Nov 12, 2009 2:38 PM Summary of Call: Left message with information on Myoview Information Sheet (see scanned document for details).      Nuclear Med Background Indications for Stress Test: Evaluation for Ischemia   History: COPD, Myocardial Perfusion Study  History Comments: '07 MPS NL EF 74%  Symptoms: Chest Tightness  Symptoms Comments: Radiation to the jaw and arms   Nuclear Pre-Procedure Cardiac Risk Factors: Carotid Disease, Family History - CAD, History of Smoking, Hypertension, Lipids Height (in): 67  Nuclear Med Study Referring MD:  D.McLean

## 2010-08-14 NOTE — Medication Information (Signed)
Summary: Med List  Med List   Imported By: Harlon Flor 07/19/2009 10:29:42  _____________________________________________________________________  External Attachment:    Type:   Image     Comment:   External Document

## 2010-09-12 ENCOUNTER — Ambulatory Visit (INDEPENDENT_AMBULATORY_CARE_PROVIDER_SITE_OTHER): Payer: Self-pay | Admitting: Internal Medicine

## 2010-09-12 ENCOUNTER — Other Ambulatory Visit: Payer: Self-pay | Admitting: Internal Medicine

## 2010-09-12 ENCOUNTER — Encounter: Payer: Self-pay | Admitting: Internal Medicine

## 2010-09-12 ENCOUNTER — Ambulatory Visit: Payer: Self-pay | Admitting: Internal Medicine

## 2010-09-12 DIAGNOSIS — I10 Essential (primary) hypertension: Secondary | ICD-10-CM

## 2010-09-12 DIAGNOSIS — J309 Allergic rhinitis, unspecified: Secondary | ICD-10-CM

## 2010-09-12 DIAGNOSIS — J449 Chronic obstructive pulmonary disease, unspecified: Secondary | ICD-10-CM

## 2010-09-12 DIAGNOSIS — F329 Major depressive disorder, single episode, unspecified: Secondary | ICD-10-CM

## 2010-09-12 LAB — RENAL FUNCTION PANEL
Albumin: 4.2 g/dL (ref 3.5–5.2)
BUN: 20 mg/dL (ref 6–23)
Calcium: 9.5 mg/dL (ref 8.4–10.5)
Chloride: 97 mEq/L (ref 96–112)
Phosphorus: 3.4 mg/dL (ref 2.3–4.6)

## 2010-09-18 NOTE — Assessment & Plan Note (Signed)
Summary: 6 MONTH FOLLOW/JRR   Vital Signs:  Patient profile:   77 year old female Height:      67 inches Weight:      167 pounds BMI:     26.25 Temp:     98.2 degrees F oral Pulse rate:   75 / minute Pulse rhythm:   regular BP sitting:   156 / 88  (right arm) Cuff size:   regular  Vitals Entered By: Linde Gillis CMA Duncan Dull) (September 12, 2010 9:03 AM) CC: 6 month follow up   History of Present Illness: Doing fine BP up--forgot new card and checkbook and is stressed out right now  Had pinched nerve in neck in December Saw Dr Otelia Sergeant occ vicodin and uses clonazepam at night as needed   Allergies are much better occ rhinorrhea but rarely has cough singulair really helpiing  BP usually 170s/80s No headaches No chest pain Occ SOB --if rushing too much  Has been working a lot Keeps her busy but gets her washed out. Better after nap Still at [employer]  did wean off prozac Mood has been fine  Allergies: 1)  ! Keflex 2)  ! Biaxin 3)  ! Tramadol Hcl 4)  ! Keflex (Cephalexin) 5)  ! Biaxin (Clarithromycin) 6)  ! Nifedipine (Nifedipine) 7)  ! Ace Inhibitors 8)  ! Benicar (Olmesartan Medoxomil) 9)  ! Amoxicillin (Amoxicillin) 10)  ! Augmentin  Past History:  Past medical, surgical, family and social histories (including risk factors) reviewed for relevance to current acute and chronic problems.  Past Medical History: Reviewed history from 12/18/2009 and no changes required. 1. COPD: Actually quit tobacco in the 1970s.  PFTs (1/11) with mild obstructive lung disease (FEV1 65% and    FEV1/FVC ratio mildly decreased).   2. Depression 3. GERD (Dr. Lottie Mussel) 4. Hyperlipidemia 5. Hypertension 6. Osteoarthritis (Dr Otelia Sergeant  [phone]): s/p right knee arthroscopy with partial medial and lateral meniscectomies;     chondroplastic shaving, lateral portion of the lateral femoral condyle May 2009 7. Osteoporosis 8. Breast cancer, hx of (Dr Lemar Livings): mastectomy in 1980s.     -  Removal right implant 06/12/09 9. Carotid stenosis, mild. Gets yearly dopplers.  10. Spinal stenosis s/p laminectomy in 2007.  11. ACEI cough 12. Adenosine myoview (2007): EF 74%, no ischemia or infarction.  13. Angioedema with Augmentin in 12/10 requiring intubation and complicated by aspiration pneumonitis.  14. Echo (1/11): EF 60-65%, mild LVH, RV and valves without significant abnormality.  15.Allergic rhinitis  Past Surgical History: Reviewed history from 06/18/2009 and no changes required. 1987  R mastectomy 1988 Reconstruction and L reduction mammoplasty Mastoid--childhood Appendectomy--1948 2003--lumbar laminectomy 8/06   Fracture of L & R shoulders----multiple surgeries 06/12/09 Removal of right implant under general anesthesia  Family History: Reviewed history from 07/19/2009 and no changes required. Dad died @69 , had MI in his late 51s Mom died @72   ovarian cancer 1 brother died of lung cancer No DM Arthritis in family  Social History: Reviewed history from 07/19/2009 and no changes required. 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  Has living will. Son Virl Diamond is health care POA.  Requests DNR  Review of Systems       Appetite is fine weight is stable---she is careful with diet  Physical Exam  General:  alert and normal appearance.   Neck:  supple, no masses, no thyromegaly, and no cervical lymphadenopathy.   Lungs:  normal respiratory effort, no intercostal retractions, no accessory muscle use, normal breath sounds, no crackles, and no wheezes.   Heart:  normal rate, regular rhythm, no murmur, and no gallop.   Extremities:  no edema Psych:  normally interactive, good eye contact, not anxious appearing, and not depressed appearing.     Impression & Recommendations:  Problem # 1:  HYPERTENSION (ICD-401.9) Assessment Unchanged  reasonable control though not optimal no  change for now will recheck sodium  Her updated medication list for this problem includes:    Bisoprolol-hydrochlorothiazide 5-6.25 Mg Tabs (Bisoprolol-hydrochlorothiazide) .Marland Kitchen... 1 tab daily for high blood pressure    Triamterene-hctz 37.5-25 Mg Tabs (Triamterene-hctz) .Marland Kitchen... 1 tab daily  BP today: 156/88 Prior BP: 158/90 (05/28/2010)  Labs Reviewed: K+: 4.5 (05/28/2010) Creat: : 0.9 (05/28/2010)   Chol: 164 (09/13/2009)   HDL: 84.50 (09/13/2009)   LDL: 62 (09/13/2009)   TG: 87.0 (09/13/2009)  Orders: Venipuncture ([address]109) TLB-Renal Function Panel (80069-RENAL)  Problem # 2:  COPD (ICD-496) Assessment: Unchanged still gets DOE but seems stable  Her updated medication list for this problem includes:    Singulair 10 Mg Tabs (Montelukast sodium) .Marland Kitchen... 1 tab daily for severe resistant allergy symptoms  Problem # 3:  ALLERGIC RHINITIS (ICD-477.9) Assessment: Improved doing better since adding singulair  Her updated medication list for this problem includes:    Fexofenadine Hcl 180 Mg Tabs (Fexofenadine hcl) .Marland Kitchen... 1 tab daily as needed for allergies    Cetirizine Hcl 10 Mg Tabs (Cetirizine hcl) .Marland Kitchen... 1 tab up to two times a day for allergies  Problem # 4:  DEPRESSION (ICD-311) Assessment: Improved mood is better fine without meds  The following medications were removed from the medication list:    Fluoxetine Hcl 20 Mg Tabs (Fluoxetine hcl) .Marland Kitchen... 1 tab daily Her updated medication list for this problem includes:    Clonazepam 0.5 Mg Tabs (Clonazepam) .Marland Kitchen... Take one tablet by mouth every 12 hours  Complete Medication List: 1)  Bisoprolol-hydrochlorothiazide 5-6.25 Mg Tabs (Bisoprolol-hydrochlorothiazide) .Marland Kitchen.. 1 tab daily for high blood pressure 2)  Triamterene-hctz 37.5-25 Mg Tabs (Triamterene-hctz) .Marland Kitchen.. 1 tab daily 3)  Vicodin Tabs (Hydrocodone-acetaminophen tabs) .... Take 1 tablet by mouth two times a day prn 4)  Omeprazole 20 Mg Cpdr (Omeprazole) .Marland Kitchen.. 1 two times a day 5)   Pravastatin Sodium 80 Mg Tabs (Pravastatin sodium) .... Take one tablet by mouth daily at bedtime 6)  Fexofenadine Hcl 180 Mg Tabs (Fexofenadine hcl) .Marland Kitchen.. 1 tab daily as needed for allergies 7)  Naproxen Sodium 220 Mg Tabs (Naproxen sodium) .Marland Kitchen.. 1-2 tabs by mouth two times a day as needed for arthritis pain 8)  Singulair 10 Mg Tabs (Montelukast sodium) .Marland Kitchen.. 1 tab daily for severe resistant allergy symptoms 9)  Cetirizine Hcl 10 Mg Tabs (Cetirizine hcl) .Marland Kitchen.. 1 tab up to two times a day for allergies 10)  Clonazepam 0.5 Mg Tabs (Clonazepam) .... Take one tablet by mouth every 12 hours 11)  Adult Aspirin Low Strength 81 Mg Tbdp (Aspirin) .... Take 1 tablet by mouth once a day 12)  Caltrate 600 1500 Mg Tabs (Calcium carbonate) .... Take 1 by mouth once daily 13)  Vitamin B-12 1000 Mcg Tabs (Cyanocobalamin) .Marland Kitchen.. 1 tablet 3 times a week 14)  Multivitamins Tabs (Multiple vitamin) .... Take 1 tablet by mouth once a day  Patient Instructions: 1)  Please schedule a follow-up appointment in 6 months .    Orders Added: 1)  Est. Patient Level IV [60454] 2)  Venipuncture [09811]  3)  TLB-Renal Function Panel [80069-RENAL]    Current Allergies (reviewed today): ! KEFLEX ! BIAXIN ! TRAMADOL HCL ! KEFLEX (CEPHALEXIN) ! BIAXIN (CLARITHROMYCIN) ! NIFEDIPINE (NIFEDIPINE) ! ACE INHIBITORS ! BENICAR (OLMESARTAN MEDOXOMIL) ! AMOXICILLIN (AMOXICILLIN) ! AUGMENTIN

## 2010-10-02 ENCOUNTER — Encounter: Payer: Self-pay | Admitting: Internal Medicine

## 2010-10-02 ENCOUNTER — Ambulatory Visit (INDEPENDENT_AMBULATORY_CARE_PROVIDER_SITE_OTHER): Payer: Medicare Other | Admitting: Family Medicine

## 2010-10-02 ENCOUNTER — Encounter: Payer: Self-pay | Admitting: *Deleted

## 2010-10-02 ENCOUNTER — Encounter: Payer: Self-pay | Admitting: Family Medicine

## 2010-10-02 VITALS — BP 144/80 | HR 66 | Temp 98.2°F | Wt 168.1 lb

## 2010-10-02 DIAGNOSIS — J441 Chronic obstructive pulmonary disease with (acute) exacerbation: Secondary | ICD-10-CM | POA: Insufficient documentation

## 2010-10-02 MED ORDER — ALBUTEROL SULFATE HFA 108 (90 BASE) MCG/ACT IN AERS: 2.0000 | INHALATION_SPRAY | Freq: Four times a day (QID) | RESPIRATORY_TRACT | Status: DC | PRN

## 2010-10-02 MED ORDER — GUAIFENESIN-CODEINE 100-10 MG/5ML PO SYRP: 5.0000 mL | ORAL_SOLUTION | Freq: Two times a day (BID) | ORAL | Status: DC | PRN

## 2010-10-02 MED ORDER — PREDNISONE 20 MG PO TABS: 40.0000 mg | ORAL_TABLET | Freq: Every day | ORAL | Status: AC

## 2010-10-02 MED ORDER — DOXYCYCLINE HYCLATE 100 MG PO CAPS: 100.0000 mg | ORAL_CAPSULE | Freq: Two times a day (BID) | ORAL | Status: AC

## 2010-10-02 NOTE — Assessment & Plan Note (Signed)
In setting of recent allergy flare. Increased dyspnea, increased sputum production, not more purulent sputum. Treat with steroid and albuterol, cheratussin for cough at night. abx to hold on to in case not improvng as expected. Red flags to return discussed. See pt instructions.

## 2010-10-02 NOTE — Progress Notes (Signed)
  Subjective:    Patient ID: Haley Harris, female    DOB: [birth year], 77 y.o.   MRN: [id number]  HPI CC: cough/congestion  2wk h/o allergies acting up.  Able to manage with meds she takes (see meds).  Last 3 days having trouble coughing to point of being unable to breath.  Coughing fits.  + SOB.  + chills.  Productive cough of clear thick mucous.  Also with HA.  Pain in ribcage from coughing.  Feels congested.  Coughing fits occur more when exherting herself, also worse at night.  Laying flat makes it worse.    No fevers/chills, abd pain, n/v/d, rashes.  No leg swelling.  No CP.  Doesn't think this is infection.    No sick contacts at home.  Around smokers but they smoke outside.  No h/o asthma.  + COPD, not on meds.  Previously on advair, stopped.  Placed on albuterol in past.  H/o aspiration PNA last year.  Has even happened when driving.  Tried delsym for cough.    PMHx, allergies, meds, SHx reviewed.  Review of Systems Per HPI    Objective:   Physical Exam  Constitutional: She is oriented to person, place, and time. She appears well-developed and well-nourished.       With cough  HENT:  Head: Normocephalic and atraumatic.  Right Ear: External ear normal.  Left Ear: External ear normal.  Nose: Nose normal.  Mouth/Throat: Oropharynx is clear and moist.  Eyes: Conjunctivae and EOM are normal. Pupils are equal, round, and reactive to light.  Neck: Normal range of motion. Neck supple. No thyromegaly present.  Cardiovascular: Normal rate, regular rhythm, normal heart sounds and intact distal pulses.   No murmur heard. Pulmonary/Chest: Effort normal. No accessory muscle usage. No respiratory distress. She has wheezes (throughout). She has no rales. She exhibits no tenderness.       Coarse breath sounds, but no obvious rales.  Lymphadenopathy:    She has no cervical adenopathy.  Neurological: She is alert and oriented to person, place, and time.  Skin: Skin is warm and dry. No  rash noted.          Assessment & Plan:

## 2010-10-02 NOTE — Patient Instructions (Signed)
You have a COPD exacerbation. Push fluids and plenty of rest.  Out of work for tomorrow. Treat with steroids two tablets (40mg ) daily for 7 days. Albuterol 2 puffs scheduled for next few days then as needed. Cheratussin for cough at night. If fevers, worsening productive cough, fill antibiotic and start taking (prescription provided). If not improving as expected, please return to be seen. Call us with questions, good to see you today.

## 2010-10-06 ENCOUNTER — Ambulatory Visit: Payer: Medicare Other | Admitting: Family Medicine

## 2010-10-14 LAB — COMPREHENSIVE METABOLIC PANEL
ALT: 23 U/L (ref 0–35)
AST: 20 U/L (ref 0–37)
Albumin: 3 g/dL — ABNORMAL LOW (ref 3.5–5.2)
Alkaline Phosphatase: 56 U/L (ref 39–117)
BUN: 15 mg/dL (ref 6–23)
BUN: 16 mg/dL (ref 6–23)
CO2: 22 mEq/L (ref 19–32)
CO2: 24 mEq/L (ref 19–32)
Calcium: 8.4 mg/dL (ref 8.4–10.5)
Calcium: 8.5 mg/dL (ref 8.4–10.5)
Chloride: 104 mEq/L (ref 96–112)
Chloride: 105 mEq/L (ref 96–112)
Chloride: 105 mEq/L (ref 96–112)
Creatinine, Ser: 0.57 mg/dL (ref 0.4–1.2)
Creatinine, Ser: 0.72 mg/dL (ref 0.4–1.2)
Creatinine, Ser: 0.81 mg/dL (ref 0.4–1.2)
GFR calc Af Amer: >60 mL/min (ref >60)
GFR calc Af Amer: >60 mL/min (ref >60)
GFR calc Af Amer: >60 mL/min (ref >60)
GFR calc non Af Amer: >60 mL/min (ref >60)
GFR calc non Af Amer: >60 mL/min (ref >60)
Potassium: 3.8 mEq/L (ref 3.5–5.1)
Total Bilirubin: 0.4 mg/dL (ref 0.3–1.2)
Total Bilirubin: 0.4 mg/dL (ref 0.3–1.2)
Total Bilirubin: 0.8 mg/dL (ref 0.3–1.2)
Total Protein: 5.5 g/dL — ABNORMAL LOW (ref 6.0–8.3)

## 2010-10-14 LAB — CBC
HCT: 36.1 % (ref 36.0–46.0)
HCT: 37.3 % (ref 36.0–46.0)
HCT: 38.6 % (ref 36.0–46.0)
HCT: 39.6 % (ref 36.0–46.0)
Hemoglobin: 13 g/dL (ref 12.0–15.0)
Hemoglobin: 13.4 g/dL (ref 12.0–15.0)
MCHC: 33.5 g/dL (ref 30.0–36.0)
MCHC: 35.1 g/dL (ref 30.0–36.0)
MCV: 98.6 fL (ref 78.0–100.0)
MCV: 99.1 fL (ref 78.0–100.0)
MCV: 99.3 fL (ref 78.0–100.0)
Platelets: 229 10*3/uL (ref 150–400)
Platelets: 231 10*3/uL (ref 150–400)
RBC: 3.64 MIL/uL — ABNORMAL LOW (ref 3.87–5.11)
RBC: 3.92 MIL/uL (ref 3.87–5.11)
RBC: 3.95 MIL/uL (ref 3.87–5.11)
RBC: 4.05 MIL/uL (ref 3.87–5.11)
RDW: 13.4 % (ref 11.5–15.5)
RDW: 13.7 % (ref 11.5–15.5)
WBC: 11.4 10*3/uL — ABNORMAL HIGH (ref 4.0–10.5)
WBC: 14.5 10*3/uL — ABNORMAL HIGH (ref 4.0–10.5)
WBC: 9.1 10*3/uL (ref 4.0–10.5)
WBC: 9.5 10*3/uL (ref 4.0–10.5)

## 2010-10-14 LAB — DIFFERENTIAL
Lymphocytes Relative: 9 % — ABNORMAL LOW (ref 12–46)
Lymphs Abs: 1.2 10*3/uL (ref 0.7–4.0)
Monocytes Absolute: 0.5 10*3/uL (ref 0.1–1.0)
Monocytes Relative: 4 % (ref 3–12)
Neutro Abs: 12.8 10*3/uL — ABNORMAL HIGH (ref 1.7–7.7)

## 2010-10-14 LAB — GLUCOSE, CAPILLARY
Glucose-Capillary: 108 mg/dL — ABNORMAL HIGH (ref 70–99)
Glucose-Capillary: 112 mg/dL — ABNORMAL HIGH (ref 70–99)
Glucose-Capillary: 122 mg/dL — ABNORMAL HIGH (ref 70–99)
Glucose-Capillary: 125 mg/dL — ABNORMAL HIGH (ref 70–99)
Glucose-Capillary: 132 mg/dL — ABNORMAL HIGH (ref 70–99)
Glucose-Capillary: 134 mg/dL — ABNORMAL HIGH (ref 70–99)
Glucose-Capillary: 135 mg/dL — ABNORMAL HIGH (ref 70–99)
Glucose-Capillary: 136 mg/dL — ABNORMAL HIGH (ref 70–99)
Glucose-Capillary: 146 mg/dL — ABNORMAL HIGH (ref 70–99)
Glucose-Capillary: 150 mg/dL — ABNORMAL HIGH (ref 70–99)
Glucose-Capillary: 151 mg/dL — ABNORMAL HIGH (ref 70–99)
Glucose-Capillary: 154 mg/dL — ABNORMAL HIGH (ref 70–99)
Glucose-Capillary: 155 mg/dL — ABNORMAL HIGH (ref 70–99)
Glucose-Capillary: 160 mg/dL — ABNORMAL HIGH (ref 70–99)
Glucose-Capillary: 198 mg/dL — ABNORMAL HIGH (ref 70–99)
Glucose-Capillary: 222 mg/dL — ABNORMAL HIGH (ref 70–99)

## 2010-10-14 LAB — BLOOD GAS, ARTERIAL
Acid-Base Excess: 0.3 mmol/L (ref 0.0–2.0)
Bicarbonate: 22.3 mEq/L (ref 20.0–24.0)
Bicarbonate: 23.4 mEq/L (ref 20.0–24.0)
Drawn by: 295031
FIO2: 0.4 %
O2 Saturation: 98.6 %
PEEP: 5 cmH2O
PEEP: 5 cmH2O
Patient temperature: 98.6
Pressure support: 12 cmH2O
TCO2: 20 mmol/L (ref 0–100)
TCO2: 21.1 mmol/L (ref 0–100)
pCO2 arterial: 34 mmHg — ABNORMAL LOW (ref 35.0–45.0)
pCO2 arterial: 35.3 mmHg (ref 35.0–45.0)
pH, Arterial: 7.417 — ABNORMAL HIGH (ref 7.350–7.400)
pH, Arterial: 7.451 — ABNORMAL HIGH (ref 7.350–7.400)
pO2, Arterial: 113 mmHg — ABNORMAL HIGH (ref 80.0–100.0)
pO2, Arterial: 73.7 mmHg — ABNORMAL LOW (ref 80.0–100.0)

## 2010-10-14 LAB — BASIC METABOLIC PANEL
BUN: 16 mg/dL (ref 6–23)
CO2: 23 mEq/L (ref 19–32)
CO2: 25 mEq/L (ref 19–32)
Calcium: 8.6 mg/dL (ref 8.4–10.5)
Calcium: 8.8 mg/dL (ref 8.4–10.5)
Chloride: 106 mEq/L (ref 96–112)
GFR calc Af Amer: >60 mL/min (ref >60)
GFR calc non Af Amer: >60 mL/min (ref >60)
GFR calc non Af Amer: >60 mL/min (ref >60)
Sodium: 136 mEq/L (ref 135–145)

## 2010-10-14 LAB — CLOSTRIDIUM DIFFICILE EIA

## 2010-10-14 LAB — CULTURE, RESPIRATORY W GRAM STAIN

## 2010-10-15 LAB — BASIC METABOLIC PANEL
BUN: 17 mg/dL (ref 6–23)
Calcium: 9 mg/dL (ref 8.4–10.5)
Creatinine, Ser: 0.89 mg/dL (ref 0.4–1.2)
GFR calc Af Amer: >60 mL/min (ref >60)

## 2010-11-12 ENCOUNTER — Other Ambulatory Visit: Payer: Self-pay | Admitting: Cardiology

## 2010-11-12 DIAGNOSIS — I6529 Occlusion and stenosis of unspecified carotid artery: Secondary | ICD-10-CM

## 2010-11-13 ENCOUNTER — Encounter (INDEPENDENT_AMBULATORY_CARE_PROVIDER_SITE_OTHER): Payer: Medicare Other | Admitting: *Deleted

## 2010-11-13 DIAGNOSIS — I6529 Occlusion and stenosis of unspecified carotid artery: Secondary | ICD-10-CM

## 2010-11-17 ENCOUNTER — Telehealth: Payer: Self-pay | Admitting: *Deleted

## 2010-11-17 ENCOUNTER — Encounter: Payer: Self-pay | Admitting: Cardiology

## 2010-11-17 NOTE — Telephone Encounter (Signed)
Patient is asking for a rx for a prosthetic R breast to be faxed to [facility]. She has an appt to be fitted tomorrow so she will need it faxed before then.

## 2010-11-17 NOTE — Telephone Encounter (Signed)
Rx written.

## 2010-11-17 NOTE — Telephone Encounter (Signed)
rx faxed to [facility]

## 2010-11-25 NOTE — Op Note (Signed)
Haley Harris, Haley Harris NO.:  [id number]   MEDICAL RECORD NO.:  [id number]          PATIENT TYPE:  AMB   LOCATION:  DSC                          FACILITY:  [facility]   PHYSICIAN:  Kerrin Champagne, M.D.   DATE OF BIRTH:  1929-06-05   DATE OF PROCEDURE:  11/17/2007  DATE OF DISCHARGE:                               OPERATIVE REPORT   PREOPERATIVE DIAGNOSES:  1. Right knee posterior horn meniscus and posterior horn lateral      meniscus tears.  2. Degenerative joint disease, mild tricompartment.   POSTOPERATIVE DIAGNOSES:  1. Right knee posterior horn meniscus and posterior horn lateral      meniscus tears.  2. Degenerative joint disease, mild tricompartment.   The patient was found to have primarily degenerative type tears of both  posterior horn of the medial meniscus and lateral meniscus.  Exuberant  lateral fat pad, which made visualization of both lateral and medial  compartment somewhat difficult, but was able to be evaluated and  treated.   PROCEDURE:  Right knee arthroscopy with partial medial and lateral  meniscectomies; chondroplastic shaving, lateral portion of the lateral  femoral condyle.   SURGEON:  Kerrin Champagne, MD   ASSISTANT:  Wende Neighbors, PA-C.   ANESTHESIA:  General via esophageal obturator.   She has supplementation with local infiltration of Marcaine 0.5% with  1:200,000 epinephrine, knee block by Dr. Marguerita Merles.   FINDINGS:  Torn posterior horn medial meniscus, degenerative type and  torn posterior horn and lateral meniscus also degenerative type.  The  patient is stage IV chondromalacia changes in lateral portion and  lateral femoral condyle, and the medial femoral condyle with lateral  changes as well.  No significant joint weightbearing surface abnormality  noted.  The patellofemoral joint was not evaluated.   ESTIMATED BLOOD LOSS:  300 mL.   COMPLICATIONS:  None.   TOTAL TOURNIQUET TIME:  Zero minutes at 0 mmHz.   HISTORY OF PRESENT ILLNESS:  The patient is a 77 year old female with  history of previous lumbar surgery.  She has been experiencing  persistent recurring effusions of the right knee with popping and  catching over the medial aspects of the knee.  He has had previous  evaluation and treatment for osteoarthritis, is taking Mobic.  Her exam  has showed loss of full extension by 5 degrees and flexion of 95  degrees, and pain over the posterior medial joint line and anterior  medial joint line.  He has a positive Lachman sign and radiographs that  show only very mild degenerative changes of the right medial and lateral  joint lines.  The articular surface of the patella noted small spurs at  the inferior pole of patella.  Because of regarding effusions that were  worsening MRI scan was obtained.  This demonstrated the meniscal tears  both probably the posterior horn of the medial meniscus, extension of  the joint line as well as meniscal tears involving the lateral meniscus,  posterior horn fraying degenerative type.  She was brought to the  operating room to undergo partial  meniscectomy, both medial and lateral  meniscus for recurring increased infusions of right knee, possibly  related to mechanical internal derangement of the knee due to meniscal  tears.   INTRAOPERATIVE FINDINGS:  As above.   DESCRIPTION OF THE PROCEDURE:  After adequate knee block, the patient  had facemask anesthesia, but was awake during most of the procedure.  She had standard prep to the right lower.  Using DuraPrep solution, the  right leg in the leg holder end up and legs flexed at knees.  The  patient had drape in the usual manner.  The initial incision was made in  the anterior inferolateral portal first using a spinal needle to place a  opening directly into the knee joint in identifying this joint line.  Also determining the level of local anesthesia which was excellent.  Stab incision was then made in line  with this spinal needle using a 11-  blade scalpel and then a trocar used of blunt-tip to enter the knee  synovium.  The arthroscopic sleeve was then inserted, knee inflated with  irrigant solution and an examination of the medial recess performed in  the anterior portion of the medial joint line.  Knee then placed in  somewhat valgus stress and the opening of medial joint level quite  nicely.  Examining the anterior, medial, and posterior portion there by  medial meniscus an incision then made into the region both the medial  joint line first using a spinal needle to identify entry point level  with the joint line and then in line with this making a stab incision  with a 11-blade scalpel and using a trocar to make an entry into the  joint capsule.  Probe was then placed examining the patient's posterior  horn and medial meniscus to identify a tear here.  A basket was then  used to carefully trim this area and then a 4.2 full-radius shaver used  to shave the meniscal tear to smoothen this.  Examination of the weight  bearing surface of the medial joint line did identify some areas of  grade 3 chondromalacia.  ACL was found to be in good condition and  pictures obtained of this.  Difficulty in evaluating the lateral joint  line due to exuberant fat pad here.  The scope was then placed through  the medial portal and used to assess the lateral recess and then  evaluate the anterior portions of lateral joint line.  With this then a  4.2 full-radius shaver was then able to be inserted through the previous  inferior anterior and lateral portal site and then able to obtain the  anterior aspect of the knee joint.  Here, there was exuberant fat in  addition to ligamentum mucosum that was making visualization difficult  and this was then carefully resected using a full-radius shaver in order  to be able to examine the lateral joint line.  With the examination of  lateral joint line, the entire  lateral meniscus appeared frayed with  posterior horn torn.  The 4.2 full-radius shaver was able to be inserted  down into the lateral joint line with knee in figure-of-four and with  this I was then able to carefully shave the body of the meniscus, the  anterior, lateral body, and then posterior horn to smoothen this  debriding any loose or frayed edges.  Evaluation of the lateral portion  and lateral femoral condyle was found in area of grade 3 and grade 4  changes of  chondromalacia and this was shaved using the full-radius  shaver to smoothen this.  Joint was then irrigated with copious amounts  of irrigant solution and then expressed with any irrigant solution.  Each of the portals were then carefully infiltrated with Marcaine 0.5%  1:200,000 epinephrine 5 mL each and then subcutaneous layer was closed  with interrupted inverted sutures with 3-0 Vicryl and the skin closed  with a vertical mattress suture of 4-0 nylon.  A 4 x 4's after Adaptic  and then ABD applied with Kerlix and then a Ace wrap applied.  The  patient was then reactive, extubated and returned to the recovery room  in satisfactory condition.  Note that, with approximately 20 minutes  left in the case, the patient required intubation due to resistance and  inability to fully obtain a figure-of-four position due to the patient's  resistance against holding leg.  This was preventing our assessment of  the patient's lateral joint line.  Following the placement of a general  anesthetic and intubation, we were able to easily assess lateral joint  surface.   POSTOPERATIVE CARE:  The patient will be discharged, using crutches,  weightbearing as tolerated.  She can take hydrocodone 5/325 mg 1-2 every  4-6 hours p.r.n. pain and Mobic 15 mg p.o. daily.  Use ice locally.  She  is to  ambulate the leg and remove her dressing in 4 days postoperatively,  which time she bathe as needed and use bandage locally for the incision   areas.  Will be seen back at the office in 7-10 days to have removal of  sutures and then at 2-3 weeks postoperatively.      Kerrin Champagne, M.D.  Electronically Signed     JEN/MEDQ  D:  11/17/2007  T:  11/18/2007  Job:  [id number]

## 2010-11-28 NOTE — Discharge Summary (Signed)
NAME:  Haley Harris, Haley Harris                           ACCOUNT NO.:  [id number]   MEDICAL RECORD NO.:  [id number]                   PATIENT TYPE:  INP   LOCATION:  5003                                 FACILITY:  [facility]   PHYSICIAN:  Wende Neighbors, P.A.              DATE OF BIRTH:  [birth year]   DATE OF ADMISSION:  03/23/2003  DATE OF DISCHARGE:  03/27/2003                                 DISCHARGE SUMMARY   ADMISSION DIAGNOSES:  1. Lumbar spinal stenosis, mild L2-3, moderate L3-4, severe L4-5 with     lateral recess stenosis, bilateral L5-S1 and herniated nucleus pulposus     left L5-S1. Degenerative spondylolisthesis L4-5 grade 1 and L5-S1 grade     1.  2. Hypertension.  3. Gastroesophageal reflux disease.  4. Diverticulitis.  5. Elevated cholesterol.  6. Anemia preoperative after two units of autologous blood donation.  7. Anxiety and depression.  8. History of breast cancer status post mastectomy in 1988, right breast     with reconstruction 1989 and a replacement prosthesis and reconstruction     in 1993.  9. Osteoporosis.  10.      Mild sleep apnea with occasional CPAP usage.  11.      Status post bilateral elbow fractures requiring multiple     reconstructions.  12.      Status post hysterectomy.  13.      Status post left wrist fracture requiring open reduction internal     fixation.  14.      Bilateral hearing aids.  15.      Osteoarthritis.  16.      Nocturia.   DISCHARGE DIAGNOSES:  1. Lumbar spinal stenosis, mild L2-3, moderate L3-4, severe L4-5 with     lateral recess stenosis, bilateral L5-S1 and herniated nucleus pulposus     left L5-S1. Degenerative spondylolisthesis L4-5 grade 1 and L5-S1 grade     1.  2. Hypertension.  3. Gastroesophageal reflux disease.  4. Diverticulitis.  5. Elevated cholesterol.  6. Anemia preoperative after two units of autologous blood donation.  7. Anxiety and depression.  8. History of breast cancer status post mastectomy in 1988,  right breast     with reconstruction 1989 and a replacement prosthesis and reconstruction     in 1993.  9. Osteoporosis.  10.      Mild sleep apnea with occasional CPAP usage.  11.      Status post bilateral elbow fractures requiring multiple     reconstructions.  12.      Status post hysterectomy.  13.      Status post left wrist fracture requiring open reduction internal     fixation.  14.      Bilateral hearing aids.  15.      Osteoarthritis.  16.      Nocturia.  17.      Intraoperative dural tear right central superior  aspect at L4.  18.      Posthemorrhagic anemia requiring blood transfusion utilizing 2     units of autologous blood.  19.      Postoperative constipation, resolved.  20.      Postoperative fevers probably secondary to atelectasis.   PROCEDURE:  On March 23, 2003 the patient underwent central  decompressive laminectomy L2-3, L3-4, L4-5, and L5-S1 with partial medial  facetectomy L2-3, L3-4, L4-5, and L5-S1, removal of the spinous process of  L3, L4, and L5 and the central portions of the lamina of L3, L4, and L5.  Decompression of bilateral L3, L4, L5, and S1 nerve roots. Discectomy left  L5-S1. Posterolateral fusion L4-5 and L5-S1 utilizing local bone graft and  allograft bone graft material in the form of Symphony material. Repair of  dural leak right superior aspect L4 using a single 4-0 Nurolon suture and  fibrin glue performed by Dr. Otelia Sergeant, assisted by Maud Deed, P.A.-C. under  general anesthesia.   CONSULTATIONS:  Dr. Thomasena Edis of physical medicine and rehabilitation.   BRIEF HISTORY:  The patient is a 77 year old white female with a history of  progressive worsening of neurogenic claudication of bilateral lower  extremities. She also was noted to have pain in the distribution of the S1  nerve on the left side. Standing, walking, and sitting all caused  significant discomfort. Preoperative studies demonstrated severe spinal  stenosis at the L4-5  level with grade 1 spondylolisthesis at L4-5 and L5-S1.  Bilateral lateral recess stenosis at L5-S1 and a herniated nucleus pulposus  on the left side at L5-S1 was affecting the left S1 nerve root and the left  thecal sack. Moderately severe spinal stenosis at L3-4 and mild stenosis at  L2-3 was also noted. Lengthy attempts at conservative management did include  epidural steroid therapy as well as physical therapy and medications all  which failed to relieve her symptoms. It was felt that she would require  surgical intervention and was admitted for the above stated procedure.   HOSPITAL COURSE:  The patient tolerated the procedure under general  anesthesia. She did have a dural tear intraoperatively, which was repaired  with Valsalva maneuver while the patient was under anesthesias proved there  to be no further leaking. Therefore she was stabilized and sent to the floor  from PACU. She was able to begin on a physical therapy program at the usual  protocol on the first postoperative day. The patient was fitted with an  Aspen LSO and was able to begin bed to chair transfers as well as ambulation  once the brace was fitted appropriately. Physical therapy assisted her  during her hospital stay with her ambulatory status. She was somewhat slow  to advance. She did utilize a rolling walker. The patient was felt to  require extensive inpatient stay as she would be required to be totally  independent at the time of discharge. A rehab consult was obtained and the  patient was seen and evaluated. She was deemed a suitable candidate for  inpatient rehabilitation and her name was placed on a bed waiting list while  she continued her acute stay in the hospital. The patient utilized PCA  analgesics initially for her discomfort. Gradually she was weaned to p.o.  analgesics and tolerated these well. She did have elevated fevers for the first three postoperative days and eventually this did return to a  normal  value. Urinalysis obtained with discontinuation of her Foley on the second  postoperative day  showed negative for urinary tract infection. The patient  did have O2 saturations of 91% on room air with a history of COPD. A chest x-  ray was obtained, however, at the time of this dictation the results have  not been obtained yet. Felt that her elevated fevers as well as low O2  saturations were most likely related to atelectasis postoperatively. She has  been treated with incentive spirometry and coughing and deep breathing. The  patient started on a clear liquid diet and was advanced very slowly. She was  able to have a bowel movement with the assistance of laxatives on the third  postoperative day. She had good bowel sounds throughout the hospital stay  and was able to advance to a regular diet. The patient was voiding well  following the removal of her Foley catheter. The patient had significant  difficulty with bed mobility as she was weakened in both upper extremities  secondary to previous bilateral elbow surgery as well as wrist surgery. An  overhead frame and trapeze bar was provided for her and she was allowed to  use this for bed mobility only in very limited capacity. Occupational  therapy assisted her with ADLs. She tolerated these without difficulty. On  March 27, 2003 the patient was felt to be stable for transfer to  subacute care unit for continuation of her physical therapy to become more  independent prior to discharge.   PERTINENT LABORATORY DATA:  Admission CBC with WBC 3.8, RBC 3.5, hemoglobin  and hematocrit 11.5 and 33.6 respectively. Hemoglobin and hematocrit dropped  to 8.4 and 25.0 postoperatively and the patient received two units of  autologous blood. On September 13 hemoglobin and hematocrit were stable at  9.3 and 27.0. Coagulation studies on admission were within normal limits.  BMET on admission was within normal limits and a repeat on September 11  was  normal with the exception of calcium of 7.8. Chest x-ray on admission showed  no evidence of acute cardiopulmonary disease, chronic changes of wedge  compression fracture at T8 noted to be stable. Demyelinization was noted.  This chronically correlates with patient with osteoporosis. EKG on admission  showed normal sinus rhythm, nonspecific ST abnormalities.   PLAN:  The patient is being transferred to subacute care of [facility]  There she will continue to receive physical therapy. She should continue  with ambulation and gait training as well as back care precautions. She will  wear her Aspen brace at all times when out of bed. The brace may be applied  while standing at bedside. She should continue with occupational therapy  training for ADLs. She will be allowed to shower. Her wound has been healing  well throughout the hospital stay. She did have a Hemovac drain which was discontinued on the first postoperative day and dressing changes were done  daily thereafter. She should continue to receive daily dressing changes. As  there is no drainage at this time she will be allowed to shower. The patient  will follow up with Dr. Otelia Sergeant in the office following her stay at rehab.   DISCHARGE MEDICATIONS:  1. OS-Cal D p.o. daily.  2. Aspirin 81 mg p.o. daily.  3. Procardia 90 mg p.o. daily.  4. K-Dur 20 mEq p.o. b.i.d.  5. Protonix 80 mg p.o. daily.  6. Prozac 40 mg p.o. daily.  7. Advair 100/50 discus inhaler b.i.d.  8. Singulair 10 mg p.o. q.h.s.  9. Forteo injections 20 mcg daily subcu. The patient has  been providing her     own medication for this.  10.      Colace 1 p.o. b.i.d.   P.R.N. MEDICATIONS:  1. Robaxin 500 mg 1 p.o. q.8h. p.r.n. spasm.  2. Percocet 5/325 1-2 every 4-6 hours as needed for pain.  3. Zofran 2-4 mg q.8h. p.r.n. nausea.  4. Laxative of choice.  5. Enema of choice.   DISCHARGE INSTRUCTIONS:  The patient should continue on a regular diet  unless she  has any nausea or vomiting. The patient is anemic and will have  follow-up labs at the rehab center. Her chest x-ray done on March 26, 2003 is still pending at this time. This will be followed at the rehab  center.   Her primary care physician is Dr. Posey Rea. Should she have medical  concerns that need to be addressed please notify him. Dr. Otelia Sergeant will see the  patient in follow-up on the rehab unit while she is there.                                                  Wende Neighbors, P.A.    SMV/MEDQ  D:  03/27/2003  T:  03/27/2003  Job:  [id number]

## 2010-11-28 NOTE — Discharge Summary (Signed)
Haley Harris, Haley Harris NO.:  [id number]   MEDICAL RECORD NO.:  [id number]          PATIENT TYPE:  INP   LOCATION:  5037                         FACILITY:  [facility]   PHYSICIAN:  Kerrin Champagne, M.D.   DATE OF BIRTH:  July 19, 1928   DATE OF ADMISSION:  09/25/2005  DATE OF DISCHARGE:  10/02/2005                                 DISCHARGE SUMMARY   ADMISSION DIAGNOSES:  1.  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  Status post central      decompressive laminectomy L2-3 to L5-S1 with posterolateral fusion on      instrumented L4-5 and L5-S1 in 2004.  2.  Diabetes mellitus.  3.  History of chronic obstructive pulmonary disease.  4.  Hypertension.  5.  Peripheral vascular disease.  6.  Coronary artery disease.  7.  Gastroesophageal reflux disease.  8.  History of diverticulitis.  9.  History of anemia.  10. Depression.  11. History of breast cancer, status post right breast mastectomy with      reconstruction.   DISCHARGE DIAGNOSES:  1.  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  Status post central      decompressive laminectomy L2-3 to L5-S1 with posterolateral fusion on      instrumented L4-5 and L5-S1 in 2004.  2.  Diabetes mellitus.  3.  History of chronic obstructive pulmonary disease.  4.  Hypertension.  5.  Peripheral vascular disease.  6.  Coronary artery disease.  7.  Gastroesophageal reflux disease.  8.  History of diverticulitis.  9.  History of anemia.  10. Depression.  11. History of breast cancer, status post right breast mastectomy with      reconstruction.  12. Postoperative left lower lobe pneumonia.  13. Posthemorrhagic anemia.  14. Mild postoperative ileus, resolved at discharge.  15. Hypokalemia, resolved at discharge.  16. Pressure ulcer of chin, treated with topical antibiotic ointment.  17. Urinary stress incontinence.   PROCEDURE:  On September 25, 2005, the patient underwent:  1.  Redo central laminectomy L4-5 and L5-S1.  2.   Left-sided total facetectomy with decompression of the left L4 and L5      nerve roots.  3.  Right-sided facetectomy with decompression of the L4 and L5 nerve roots.  4.  Takedown of pseudoarthrosis of posterolateral fusion masses L4-5 and L5-      S1.  5.  Posterolateral fusion rearthrodesis utilizing a combination of local and      allograft with Symphony bone graft material.  6.  Internal fixation using Monarch pedicle screws and rods from L4 to S1.  7.  Left-sided translaminar interbody fusion utilizing Leopard cage at L4-5      and L5-S1. This was performed by Dr. Otelia Sergeant and assisted by Wende Neighbors, P.A. under general anesthesia.   CONSULTATIONS:  Rene Paci, M.D. [facility].   BRIEF HISTORY:  The patient is a 77 year old female with previous central  decompressive laminectomy from L2 to S1 for severe lumbar spinal stenosis.  She also underwent uninstrumented posterolateral fusion for  treatment of  spondylolisthesis at both L4-5 and L5-S1 level.  The patient has had several  months history of increasing pain in the back and lower extremities.  Radiographs have suggested worsening of her spondylolisthesis at the L4-5  level and L5-S1 level indicating pseudoarthrosis.  It was felt that she was  developing severe foraminal entrapment with mechanical back pain.  Operative  intervention was indicated and the patient was admitted to undergo the  procedure as stated above.   HOSPITAL COURSE:  The patient tolerated the procedure under general  anesthesia without complications.  Postoperatively, she was placed on PCA  analgesics and gradually weaned to p.o. analgesics without significant  difficulties.  The patient's Hemovac drain was discontinued on the first  postoperative day.  Her wound was checked daily thereafter.  In the latter  days of the hospital stay, she did develop some tape irritation and the  wound was left open to air.  The patient was  fitted with an Aspen LSO and  this was utilized during the hospital stay.  She was allowed bed rest  without the brace, but brace was applied for anytime out of bed including  ambulation into the bathroom.  Brace was donned and doffed at the bedside.  She received physical therapy consult for ambulation and gait training as  well as back care.  Occupational therapy assisted her with ADL's.  The  patient did progress steadily throughout the hospital stay, however, it was  felt that she would require further rehabilitation prior to returning to her  home independently.  Fortunately, she had made arrangements with [facility] for continued rehab postoperatively.  During the hospital  stay, a medical consult was obtained as the patient had developed nocturnal  temperatures elevating as high as 103.  Chest x-ray did indicate a left  lower lobe effusion.  It was suspected that she had aspiration pneumonia.  Her CBC did not show elevations in her white blood cell count.  She was  placed on Avelox 400 mg daily and treated with incentive spirometry and  albuterol handheld nebulizers.  Through the remainder of the hospital stay,  she did continue to have low grade fevers and continued to complain of cough  with sputum production.  At the time of discharge, she was essentially  afebrile and her cough was improving.  The patient did have a low grade  ileus during the hospital stay with difficulty having a bowel movement.  Eventually after several days of stool softeners and laxatives, she was able  to have a bowel movement.  Her diet was advanced to diabetic regular diet as  she was able to clear the ileus.  The patient had a history of urinary  stress incontinence.  Ditropan was utilized during the hospital stay, but  not sufficient to relieve her urgency.  Detrol was ordered and the patient was able to do much better in the way of her incontinence.  Urinalysis and  culture were  negative for urinary tract infection.  Hypertension was stable  through the hospital stay.  The patient did have posthemorrhagic anemia.  Intraoperatively, she received 4 units of packed red blood cells as well as  2 units of Cellsaver blood.  Two of her packed red blood cells were  autologous units.  The patient had hypokalemia postoperatively which was  treated with supplementation and resolved at discharge.  At the time of  discharge, the patient was eating well, voiding well, and did have  a bowel  movement.  Neurovascular motor function of the lower extremities were intact  as it had been throughout the hospital stay.  She was felt stable for  transfer to the skilled nursing facility for continuation of her  rehabilitation.  The patient did have a pressure ulceration from positioning  intraoperatively of her chin.  This was treated with localized antibiotic  ointment.  Healing did continue through the hospital stay and continued care  will be provided at the nursing facility.  No wound breakdown or cellulitis  was noted.   LABORATORY DATA:  CBC on admission with hemoglobin 10.0, hematocrit 28.6.  The patient had donated 2 units of autologous blood preoperatively, hence  resulting in mild anemia.  As stated above, she did receive 4 units total of  packed red blood cells intraoperatively as well as 2 units of Cellsaver  blood.  Urinalysis throughout the hospital stay remained without signs of  infection.  Chemistry studies on admission were within normal limits.  The  patient had hypokalemia at 3.3 which resolved with supplementation.  Hyponatremia also noted which improved with hydration.  Last CBC on September 30, 2005, showed WBC 4.1, hemoglobin 9.6, hematocrit 27.3.   Chest x-ray on March 19, showed left lower lobe effusion with bilateral  basilar atelectasis.  Repeat on March 21, showed improving left lower lobe  atelectasis and left pleural effusion, minimal right basilar  atelectasis.   EKG on admission showed normal sinus rhythm with no significant change since  last tracing confirmed by Olga Millers, M.D. [facility]   PLAN:  The patient is being transferred to [facility].  There she will continue with her physical therapy daily which should include  ambulation and gait training.  She will wear her Aspen LSO at all times when  out of bed.  The brace may be donned and doffed at bedside, seated, and  adjusted when the patient is standing.  The patient should receive  occupational therapy for ADL's.  No range of motion of the lumbar spine is  allowed at this time.  Light strengthening and stretching exercises of the  lower extremities is encouraged.  Essentially ambulation will be her best  exercise at this time.   The patient should follow up with Dr. Otelia Sergeant 2 weeks from the date of her  surgery.  Arrangements may be made for this appointment by calling [phone] and she may be transported by automobile.  Her primary care physician is  Georgina Quint. Plotnikov, M.D. [facility] and she has been instructed to follow up with  him in 1 week.  Arrangements for this may be made by calling [phone].   DISCHARGE MEDICATIONS:  1.  Avelox 400 mg one p.o. daily for an additional 7 days.  2.  Toprol XL 60 mg once daily.  3.  Procardia 60 mg once daily.  4.  Nexium 40 mg daily.  5.  Prozac 20 mg daily.  6.  Zocor 20 mg daily.  7.  Os-Cal D one p.o. b.i.d.  8.  Colace one p.o. b.i.d.  9.  OxyContin 10 mg one p.o. q.12 hours.  10. Robaxin 500 mg one p.o. q.8 hours p.r.n. spasm.  11. K-Dur 20 mEq p.o. daily.  12. Detrol 8 mg p.o. q.h.s.  13. Trinsicon one p.o. daily.  14. OxyIR one to two every 4-6 hours as needed for pain.  15. Restoril 15 mg p.o. q.h.s. p.r.n. sleep.  16. Bactroban ointment to her chin ulceration b.i.d.  17. Robitussin DM 10 mL one p.o. q.4 hours p.r.n. cough.  18. Laxative of choice, enema of choice, or suppository of choice as needed.   She  should be encouraged in coughing and deep breathing and incentive  spirometry q.1 hour while awake.   She will continue on a low carbohydrate diabetic diet.  No concentrated  sweets.   The patient at this point does not require a dressing to her wound unless  she does have pressure from her clothing and then a 4x4 with light tape may  be utilized.  However she has been having some skin sensitivity to the tape  and therefore at this time we have opted to leave the wound open to air.  She may shower and get the wound wet.  The  Steri-Strips will peel spontaneously and should be allowed to do so.  Any  remaining Steri-Strips will be removed at her next office visit with Dr.  Otelia Sergeant.  All questions encouraged and answered.   CONDITION ON DISCHARGE:  Stable.      Wende Neighbors, P.A.      Kerrin Champagne, M.D.  Electronically Signed    SMV/MEDQ  D:  10/02/2005  T:  10/02/2005  Job:  [id number]

## 2010-11-28 NOTE — Op Note (Signed)
[facility]  Patient:    Haley Harris, Haley Harris Visit Number: [id number] MRN: [id number]          Service Type: SUR Location: 4W [id number] [id number] Attending Physician:  Marlowe Kays Page Dictated by:   Illene Labrador. Aplington, M.D. Proc. Date: 01/10/02 Admit Date:  01/10/2002 Discharge Date: 01/12/2002                             Operative Report  PREOPERATIVE DIAGNOSIS:   Displaced fracture, left radial head; status post complex elbow fractures and multiple operations.  POSTOPERATIVE DIAGNOSES: 1. Displaced fracture, left radial head; status post complex elbow fractures    and multiple operations. 2. Nonunion proximal ulnar fracture, left elbow.  OPERATIONS: 1. Insertion of cobalt chrome radial head prosthesis, left elbow. 2. Takedown of fibrous union, with internal strut bone allograft and    external crouton allograft.  Application of five-hole semitubular    plate, proximal left ulna.  SURGEON:  Illene Labrador. Aplington, M.D.  ASSISTANTDruscilla Brownie. Shela Nevin, P.A.  ANESTHESIA:  General.  DESCRIPTION OF PROCEDURE:  Original fractures involved the olecranon, proximal ulna and proximal radius on the left; occurred in the first week of November 2002.  Recent x-rays have demonstrated a distinct change in her elbow, with the radial head -- which was previously somewhat angulated, but appeared to be at least healing.  This was now completely off and appeared to be ulnar to the parent radial shaft.  The plan was to perform probable radial head replacement.  At surgery I found the radial head actually lying posterior and lateral to the lateral humeral condyle, and a nonunion of the proximal ulna where we previously plated her.  Both of these items were then felt to require repair.  After appropriate prophylactic antibiotics, a MAC tourniquet, Duraprep from tourniquet to wrist with the elbow draped in sterile field; a new incision area was used going from the  posterior humeral condyle down to the olecranon, and then extending it during the case along the previous ulnar incision distally.  The interval between the anconeus and the extensor ulnaris was identified and demarcated; there was a good bit of scar present.  Initially I was not aware of the radial head being what it was, but after demarcating it and finding motion, and the fact that it looked like like the radial head, I then was able to excise it.  There were a couple of additional pieces I removed and were able to place this on a template to go with the Cobalt chrome radial head prosthesis set; measured out as a 24 mm radial head.  I then continued identification of the pathology, finding the remainder of the radius and freeing it up of fibrous tissue, with a good bit of reactive tissue ulnar to it (which I also excised).  I also located the fibrous union of the ulna, which I freshened up with rongeur, curet and sharp dissection.  I brought in the C-arm to confirm that pathology was as we had thought.  I then prepared the radius for the prosthesis, using the cannulated broaches up to an 8.5 mm, and using the normal length neck and 1-12 reduction with the prosthesis appearing to be stable.  C-arm pictures were taken confirming this.  I then prepared the ulna, using curet to curet out both sides of the fracture site and the intermedullary of both sides of the fracture site; smoothed out  the rough bone edges.  I then took some fibular allograft and created about a 4.5 cm length with two match sticks, which I was then able to insert distally in the ulna, and then retrograde in the proximal portion to get internal splints.  I then packed around this, crouton allograft.  Just prior to completing the stabilization, I placed the final radial head prosthesis, which appeared to be relatively stable and in good position.  I then completed the ulnar portion of the procedure with a five-hole  semitubular plate, with the middle hole lying over the bone graft site; so we had two fixation holes proximally and distally.  I then took C-arm x-rays with AP and lateral projections, which showed everything to be in good position.  I then irrigated the wound well with sterile saline.  Soft tissues were infiltrated with 0-.5% plain Marcaine.  The muscle capsular complex was closed tightly with interrupted #1 Vicryl.  The very thin fascia closed with interrupted 2-0 Vicryl; subcutaneous tissue with 3-0 Vicryl, and Steri-Strips on the skin. Dry sterile dressing and long arm plaster splint cast was applied, followed by a sling.  The tourniquet was released to two hours of tourniquet time, just before completing closure.  She tolerated the procedure well and was taken to recovery room in satisfactory condition, with no known complications. Dictated by:   Illene Labrador. Aplington, M.D. Attending Physician:  Joaquin Courts DD:  01/10/02 TD:  01/12/02 Job: [id number]

## 2010-11-28 NOTE — H&P (Signed)
NAME:  Haley Harris, Haley Harris NO.:  [id number]   MEDICAL RECORD NO.:  [id number]                    PATIENT TYPE:   LOCATION:                                       FACILITY:   PHYSICIAN:  Marlowe Kays, M.D.               DATE OF BIRTH:  02-06-1929   DATE OF ADMISSION:  06/01/2002  DATE OF DISCHARGE:                                HISTORY & PHYSICAL   CHIEF COMPLAINT:  Problems with my left elbow.   HISTORY OF PRESENT ILLNESS:  The patient is a 77 year old lady who has been  seen by Dr. Simonne Come for continuing problems concerning her left elbow. Her  original injury occurred in November 2002, when she  had a complex fracture  involving the olecranon and proximal ulna as well as the proximal radius of  the left elbow. This was repaired with open reduction and internal fixation  in November 2002. The patient subsequently underwent a procedure in July of  2003 by Dr. Simonne Come at [facility].  This was a redo of the ORIF  of the elbow with radial head prosthesis. Subsequent films showed good  healing of this area  of the fracture. Unfortunately, the patient began  having  pain into the elbow region and it was noted that the radial head  subluxed laterally and the ulna was developing a varus deformity. Dr.  Simonne Come discussed at length with Dr. Amanda Pea, our hand specialist, and they  concluded that a procedure with minimal invasion with removal of the radial  head prosthesis would be indicated. They will inspect the fracture site. If  it appeared to be healing at the fracture line then manipulation of the ulna  to correct the varus would be indicated. A bone stimulator will be used  postoperatively. Dr. Macarthur Critchley Plotnikov has seen the patient last Wednesday,  and he ordered a carotid Doppler last Friday.  Dr. Posey Rea is her family  physician.   CURRENT MEDICATIONS:  1. Forteo 20 mcg injected under the skin q.d.  2. Nifedipine 60 mg q.d.  3.  Potassium chloride SR tabs 8 mEq q.d.  4. Nexium 40 mg q.d.  5. Fluoxetine hydrochloride caps 40 mg 1 q.d.  6. Vioxx tablets 50 mg q.d.  7. Loratadine 10 mg q.d.  8. Calcium in the form of oyster shell.  9. She takes a baby aspirin which she will stop prior to  surgery.  10.      Ultracet p.r.n. pain.   ALLERGIES:  1. KEFLEX CAUSES FACIAL SWELLING.  2. ACE INHIBITORS CAUSE HICCUPS.  3. BIAXIN CAUSES GI PROBLEMS.   PAST SURGICAL HISTORY:  1. Mastoidectomy in 1937.  2. Tonsillectomy and adenoidectomy in 1935.  3. Appendectomy in 1948.  4. Closed reduction of the left wrist in 1987.  5. Mastectomy in 1988.  6. Reconstruction in 1989 and 1995.  7. Hysterectomy in 1995.  8. Dislocated  left shoulder in 1996 with reduction.  9. Left elbow surgeries x 4 on May 28, 2002, October 11, 2001, January 10, 2002.   FAMILY HISTORY:  Positive for heart disease in her father, hypertension as  well. Ovarian cancer in the mother and a brother with lung cancer. Her  mother had arthritis.   SOCIAL HISTORY:  The patient is widowed. She stopped smoking in 1976. She  has one or two  alcoholic beverages per day and has three children. The  caregiver after surgery will be a son and daughter-in-law. She lives in a  three level home.   REVIEW OF SYSTEMS:  CNS:  No seizures, shoulder paralysis or double vision.  RESPIRATORY:  The patient has COPD and emphysema, no productive cough or  hemoptysis. CARDIOVASCULAR:  No chest pain, no angina, no orthopnea.  GASTROINTESTINAL:  The patient has reflux disease, and as long as she takes  her medication she does quite well. No nausea or vomiting, melena or bloody  stool. GENITOURINARY:  She has urinary frequency and nocturia. No dysuria or  hematuria. MUSCULOSKELETAL:  Primarily in the present illness. She  has  panosteoarthritis and discomfort.   PHYSICAL EXAMINATION:  GENERAL:  Alert and cooperative, fully alert 73-year-  old white female.  VITAL SIGNS:   Blood pressure 140/70, pulse 82, respirations 16.  HEENT:  Normocephalic.  PERRLA. Extraocular movements intact. Oropharynx is  clear.  CHEST:  Clear to auscultation, no rhonchi or rales.  HEART:  Regular rate and rhythm, no murmurs are heard.  ABDOMEN:  Soft, nontender, liver, spleen not felt.  GENITOURINARY, RECTAL, PELVIC, BREASTS:  Not done, not pertinent to present  illness.  EXTREMITIES:  The patient has some minimal range of motion of the right  elbow. She lacks 30 degrees of full extension. She  has a varus deformity  which is quite noticeable on the left elbow. Pulses are intact in the left  upper extremity.   ADMISSION DIAGNOSES:  1. Subluxation of the radial head prosthesis, possible nonunion ulnar     fracture with varus deformity.  2. Hypertension.  3. Gastroesophageal reflux disease.  4. Anxiety and depression.  5. Chronic obstructive pulmonary disease.   PLAN:  The patient will be admitted for removal  of the radial head  prosthesis, possible ulnar fracture takedown with left iliac crest bone  graft depending on the observation of the healing  process  at surgery with  application of Acumed ortho plate. Should we have any medical problems, we  will certainly ask Dr. Macarthur Critchley Plotnikov to follow along with [place] during this  patient's hospitalization.      Dooley L. Shela Nevin, P.A.             Marlowe Kays, M.D.    DLU/MEDQ  D:  05/24/2002  T:  05/24/2002  Job:  [id number]   cc:   Georgina Quint. Plotnikov, M.D. [facility]

## 2010-11-28 NOTE — Discharge Summary (Signed)
NAME:  LOYE, REININGER                           ACCOUNT NO.:  [id number]   MEDICAL RECORD NO.:  [id number]                   PATIENT TYPE:  INP   LOCATION:  0482                                 FACILITY:  [facility]   PHYSICIAN:  Marlowe Kays, M.D.               DATE OF BIRTH:  Jul 29, 1928   DATE OF ADMISSION:  06/01/2002  DATE OF DISCHARGE:  06/04/2002                                 DISCHARGE SUMMARY   ADMISSION DIAGNOSES:  1. Subluxation of greater hip prosthesis.  Possible non-union ulnar fracture     with varus deformity.  2. Hypertension.  3. Gastroesophageal reflux disease.  4. Anxiety and depression.  5. Chronic obstructive pulmonary disease.   DISCHARGE DIAGNOSES:  1. Loss of fixation proximal ulnar fracture with subluxation of titanium     radial head prosthesis left elbow, status post removal of radial head     prosthesis.  Status post removal of broken plate of ulna.  Take-down of     fibrous union and application of Acromed olecranon plate, and application     of left anterior iliac crest bone graft.  2. Hypertension.  3. Gastroesophageal reflux disease.  4. Anxiety and depression.  5. Chronic obstructive pulmonary disease.   PROCEDURE:  The patient was taken to the operating room on 06/01/02, and  underwent removal of the radial head prosthesis, removal of broken plate of  the ulna with take-down of fibrous union, and application of Accromed  olecranon plate and application of left anterior iliac crest bone grafting.  Surgeon was Dr. Marlowe Kays.  Assistant was Dr. Dominica Severin.  Surgery  was under general anesthesia.   CONSULTATIONS:  None.   HISTORY OF PRESENT ILLNESS:  The patient is a 77 year old female seen by Dr.  Simonne Come for problems concerning her left elbow.  The original injury  occurred in 11/02.  She had a complex fracture involving the olecranon and  proximal ulna, as well as her proximal radius of the left elbow, left arm.  This was  repaired with open reduction internal fixation in 11/02.  Subsequently, she underwent a procedure in 7/03, by Dr. Simonne Come which was  a re-do open reduction internal fixation of the elbow and radial head  prosthesis.  Unfortunately, the patient started having pain.  It was noted  that the radial head subluxed and the ulna was developing a varus deformity.  After discussion with Dr. Amanda Pea, her hand surgeon, it was felt it would be  best for the patient to undergo removal of the prosthesis and possible re-do  of the ulnar fracture.  The patient was subsequently admitted to the  hospital.   LABORATORY DATA:  CBC on admission showed a hemoglobin of 13.0, hematocrit  37.4, white blood cell count 7.2, red blood cell count 3.97.  Differential  within normal limits.  PT/PTT on admission were 12.3 and 28, respectively.  INR was 0.9.  Chem panel on admission showed a low potassium of 3.3, glucose  132, remaining chem panel within normal limits.  Followup BMET showed  potassium back up to 3.6.  Urinalysis was negative.  C-arm used on 06/01/02,  removal of radial head prosthesis with re-do open reduction internal  fixation proximal ulnar fracture.  Alignment appears anatomic on the spot  film images.   HOSPITAL COURSE:  The patient was admitted to [facility], taken to  the operating room, underwent the above stated procedure without  complications.  The patient tolerated the procedure well, was later  transferred to the recovery room and then to the orthopaedic floor for  continued postoperative care.  The patient was placed on p.o. and PCA  analgesic for pain control following surgery.  She was later weaned over to  p.o. medications.  Encouraged elevation of the left arm.  Vital signs were  followed.  She was slowly weaned from PCA over to p.o.  When she was  tolerating p.o. medications and she was up moving around without difficulty.  She was seen on 06/04/02, by weekend coverage.  She  was doing quite well and  discharged home.   DISPOSITION:  The patient was discharged home on 06/04/02.   DISCHARGE DIAGNOSES:  Please see above.   DISCHARGE MEDICATIONS:  1. Percocet.  2. Robaxin.   DIET:  Low sodium diet.   ACTIVITY:  Up as tolerated.  Non-weightbearing to the left upper extremity.   FOLLOWUP:  In two weeks.   CONDITION ON DISCHARGE:  Improved.     Alexzandrew L. Julien Girt, P.A.              Marlowe Kays, M.D.    ALP/MEDQ  D:  07/12/2002  T:  07/13/2002  Job:  [id number]

## 2010-11-28 NOTE — Op Note (Signed)
Haley Harris, SANE NO.:  [id number]   MEDICAL RECORD NO.:  [id number]          PATIENT TYPE:  INP   LOCATION:  5037                         FACILITY:  [facility]   PHYSICIAN:  Kerrin Champagne, M.D.   DATE OF BIRTH:  August 08, 1928   DATE OF PROCEDURE:  09/25/2005  DATE OF DISCHARGE:                                 OPERATIVE REPORT   PREOPERATIVE DIAGNOSIS:  Nonunion L4-L5 and L5-S1, posterolateral fusion for  degenerative spondylolisthesis, status post central decompressive  laminectomy L2-L3 to L5-S1 and posterolateral fusion uninstrumented, L4-L5  and L5-S1, three to four years ago.   POSTOPERATIVE DIAGNOSIS:  Nonunion L4-L5 and L5-S1, posterolateral fusion  for degenerative spondylolisthesis, status post central decompressive  laminectomy L2-L3 to L5-S1 and posterolateral fusion uninstrumented, L4-L5  and L5-S1, three to four years ago.  The patient had severe left foraminal  entrapment affecting the L4 and L5 nerve roots.  This is secondary to  worsening spondylolisthesis at the L4-L5 and L5-S1 segments.   PROCEDURES:  1.  Re-do central laminectomy L4-L5 and L5-S1.  2.  Left-sided total facetectomy with decompression of the left L4 and L5      nerve roots.  3.  Right-sided facetectomy with decompression of the L4 and L5 nerve roots.  4.  Take down of pseudoarthrosis of posterolateral fusion masses, L4-L5 and      L5-S1.  5.  Posterolateral fusion, re-arthrodesis utilizing a combination of local      and allograft with Symphony bone graft material.  6.  Internal fixation using Monarch pedicle screws and rods from L4 to S1.  7.  Left-sided TLIF transforaminal interbody fusion utilizing a 9 mm      parallel Leopard cage at the L4-L5 level and a 7 mm lordotic cage on the      left side at the L5-S1 level.  Combination of Symphony and local bone      graft used for the TLIF procedure.   INTRAOPERATIVE FINDINGS:  As above.   HISTORY OF PRESENT ILLNESS:  This  patient is a 77 year old female who  underwent previous central decompressive laminectomy from L2 to S1 for  several lumbar spinal stenosis.  She is noted to have spondylolisthesis of  both L4-L5 and L5-S1 at the time and underwent an uninstrumented  posterolateral fusion.  Over time, she has worsened the fusion where now she  is beginning to experience symptoms again suggestive of foraminal entrapment  and mechanical back pain associated with worsening spondylolisthesis at the  L4-L5 and L5-S1 levels.   DESCRIPTION OF PROCEDURE:  After adequate general anesthesia, the patient in  a prone position, chest rolls were placed, pillows beneath the legs, well-  padded.  She had electrodes placed for intraoperative EMG continuous  monitoring.  Blood was drawn for Symphony bone graft material through an  arterial line.  Cell Saver was begun.  She had standard preoperative  antibiotics of vancomycin as she has a Keflex allergy.  Standard prep with  DuraPrep solution from the lower dorsal spine to the S3 level.  Draped in  the usual manner.  Iodine Vi-Drape was used.  The incision ellipsing the old  incision scar extending from L2 to S2.  Through the skin and subcutaneous  layers down to the lumbodorsal fascia.  Spinous process of L2 identified as  well as the spinous process of S2.  Cobbs used to elevate the paralumbar  muscles off the posterior aspects of the elements of L2 bilaterally and S2  bilaterally, identifying the depth of these areas, and then continuing the  incision in the midline to an appropriate level.  Care was taken not to  enter this central laminotomy defect at any level.  Cobbs then used to  elevate the paralumbar muscle and scar tissue bilaterally, exposing the  posterior aspect of the lateral masses ascending from L2 to S2.  Carefully,  the facts were identified at the L3-L4, L4-L5 and L5-S1 level on both sides.  Posterolateral fusion masses were identified as well.  These  were felt to be  not solidly fused and particulated although dense scar tissue did have a  mass-like quality.  There was motion still present at the L4-L5 and L5-S1  level.  Identifying the upper levels of the sacrum, lamina at the upper  aspect of S2 and S1, then a Kerrison was able to be introduced beneath the  superior lamina of S1 and used to carefully free up the thecal sac of right  and left side, performing foraminotomy of the S1 nerve root on both sides.  Then continuing up the lateral recess on the left side and performing a  partial medial facetectomy of the L5-S1 facet, resecting the facet totally  on the left side, exposing and decompressing the left L5 nerve root.  Decompression was then carried up superiorly to the left L4-L5 segment,  performing a lateral recess decompression and then total facetectomy of the  L4-L5 facet, decompressing the left L4 nerve root widely and out down  laterally.  A lateral recess decompression was carried out at the L3-L4  level on the left side performing a small medial facetectomy about 20-25%.  Similarly then, decompression was carried down on the right side, performing  foraminotomy of the right S1 nerve root, lateral recess decompression of the  L5-S1 level on the right, then resecting the right L5-S1 facet including the  inferior and superior articular process of the S1 and L5, exposing and  decompressing the L5 nerve root on the right side.  The lateral recess  decompressed on the right side at L4-L5 as it entered into the L5 neural  foramen.  The facet on the right side at L4-L5 was resected in order to  decompress the right L4 nerve root within the neural foramen of L4 on the  right side.  Lateral recess decompression carried out on the right side at  L3-L4 so that the entry portion of the L4 nerve root was well-decompressed  at the L3-L4 level as well.  This completed the decompression.  Attention was turned to carefully exposing the  transverse processes and  their remnants at the L4 level bilaterally by extending up along the lateral  aspect of the L3-L4 facet and the inferior aspect, identifying the L4  transverse process.  This was carefully debrided using curets as well as  Leksell rongeur of pseudoarthrotic material.  At the L5 level, similarly,  both transverse processes were able to be identified and they were over the  inferior aspect of the pedicle of L5 and the superior articular process of  L5.  The  sacral ala identified bilaterally and carefully debrided exposed  using Leksell rongeur as well as curets.  Bone graft was then carefully  morsellized, combined with Symphony material to make at least two large logs  of Symphony bone graft material in combination with local bone graft  material.  Attention turned first to the right side where awl was used to  make an initial entry point into the L4 level at the caudal aspect of the  transverse process insertion into the superior articular process of L4 at  its intersection.  Hand-held straight pedicle finder then used to probe the  pedicle.  This was tested using ball-tip probe and there was no evidence of  penetration.  This was then tapped after first measuring the depth at 40 mm,  tapped with a 6.25 tap and a 7 mm x 40 mm screw placed after first  decorticating transverse process and applying Symphony and local bone graft  over the transverse process and lateral aspect of the superior articular  process of L4 on the right side.  Attention then turned to L5 where the  intersection of the transverse process with the superior articular process  of L5 was also entered using an awl at its mid portion of the lower  junction.  Hand-held straight pedicle finder used to find the pedicle.  C-  arm fluoroscopy identified in AP and lateral planes to be in excellent  position and alignment.  This was measured to a depth of 40 mm again and a 7  mm x 40 mm screw was used,  first tapping with a 6.25 tap, decorticating the  transverse process, placing Symphony and local bone graft material, and  placing the 40 mm screw in appropriate position and alignment.  Attention  turned to the right S1 screw.  This was placed at the mid portion of the  sacral facet, lateral to it, and in line with the patient's S1 pedicle.  First an awl used to make an initial entry point then the hand-held straight  pedicle finder used to probe the pedicle.  Measured for a depth of 30 mm,  tapped with a 6.25 tap and a 7 mm screw placed quite nicely.  Note the ball-  tipped probe was used to probe each of the pedicles before tapping and after  tapping to insure no broaching of the cortex at either level.   Attention turned to the left side where similarly in same fashion awl used  to make an initial entry point at the L4 pedicle at the intersection of the  transverse process of L4 with the superior articular process of L4 at its lower base.  Then using a pedicle finder to probe the pedicle only.  This  was done without difficulty, measuring 40 mm and an additional 7 mm screw  was used.  Tapping with a 6.25 tap, decorticating transverse process and  lateral aspect of the superior articular process of L4, then placing bone  graft, then placing the appropriate size 40 mm screw x 7.  Attention turned  to the left L5.  Again, awl used for an entry point then a straight pedicle  finger used to probe the pedicle, tapped to 40 mm.  A 7 mm screw placed,  again tapping with 6.25 tap, decorticating the transverse process and the  lateral aspect of the superior articular process of L5, placing bone graft  then placing the appropriate 40 mm x 7 screw.  On the left side at the  sacral ala then, an awl was used to make an initial entry point just  parallel to that of the previous S1 screw and in proper degree of  convergence lateral to the S1 superior articular process, a hand-held  straight up pedicle  finder used to probe the pedicle.  This was measured to  a depth of 30 mm x 7 screw and this was inserted.   After placement of the screws, then attention was turned to placement of  TLIF on the left side, beginning on the left at the L4-L5 level, first  retracting the lateral aspect of the thecal sac and the medial inferior  aspect of the L4 nerve root, identifying the L4-L5 disc space superior to  the pedicle of L4-L5 and within the neural foramen.  A 15 blade scalpel was  used to incise the disc.  Pituitary rongeur was used to debride the disc of  disc material.  The disc space was then dilated using 8 mm then 8 mm dilator  and 9 mm provided the best fit.  Using a laminar spreader between the  pedicles of L4 and L5, the disc space was carefully curetted using straight  curets and up-biting and down-biting curets as well as ring curets.  Attempts were made to not be aggressive.  This patient has a history of  osteoporosis.  Sounding was performed using the trial 7 then 8 and 9 mm  trial.  A 9 gave the best fit.  With this then, a cage 9 mm was carefully  packed with Symphony bone graft material.  The disc space itself was  distracted using the laminar spreader and packed with additional Symphony  bone graft material.  This was packed using a 7 mm sounder.  With that then,  the 9 mm parallel Leopard cage was then inserted, impacted into place and  then carefully rotated into a midline position and transverse position.  This appeared to distract the disc space nicely, opening the neural foramen,  restoring the patient's lordotic curve.  Attention then turned to the left  L5 level after obtaining hemostasis using combination of Surgicel and  thrombin-soaked Gelfoam and cottonoids.  On the left side at L5-S1 then, the  thecal sac was retracted and the L5 nerve root retracted superiorly.  The  disc space entered using a 15 blade scalpel, pituitary rongeurs used to debride the disc space of  disc material and then disc space was carefully  dilated using 7 then 8 mm dilator.  A 7 mm lordotic cage was chosen as this  provided the best fit with sounding.  The disc space was carefully debrided  of disc material and its end plates and cartilaginous material were resected  using curets and ring curets.  Care taken not to over-curet the end plates  as the patient had osteoporotic bone.  Note that osteotomes were used to  resect the area just over the superior aspect of the pedicle of L5 and of S1  into the disc space to allow for ease of placement of the cages at both L4-  L5 and L5-S1.  Bone from disc was then removed using Kerrisons.  At the L5-  S1 level then, the disc space was slightly distracted.  However, bone graft  was then placed into the disc space without difficulty and impacted into  place using a Kicker impactor.  Then a 7 mm sounder was used to impact this  further and a 7 mm trial placed.  A  7 mm lordotic trial was then carefully  packed with Symphony bone graft material after packing the disc space.  This  was then impacted into place without difficulty and rotated into a  transverse position.  With this then, irrigation was performed and careful  hemostasis of the left L5-S1 region using bipolar electrocautery, thrombin-  soaked Gelfoam as well as Surgicel.  With this then, the case was felt to be  nearly completed.   Sixty-five mm length rods were carefully contoured.  These were pre-bent,  pre-contoured, but contoured further lordosis so that they fitted into the  screw ends on the right side and screw fasteners that had already been  loosened to accept the rods.  The patient then had placement of the fastener  caps free hand without difficulty at the upper and lower ends and the mid  requiring the persuader.  This did well.  Following then, a rod was then  carefully bent for the left side and placed within the fasteners.  In the  process of trying to place within  the fastener, the lowest screw appeared to  show some loosening, therefore was removed.  This was carefully tapped with  a 7 tap and a straight pedicle finder was used to probe again the channel  using a mallet to carefully penetrate the anterior cortex to place a  bicortical screw here.  After tapping with a 7 tap, then a 7.75 screw was  then placed of 35 mm length to obtain bicortical purchase. With this in  place then, the rod that was pre-bent was further contoured to be placed  easily within the fasteners.  Each of the fasteners was loosened up the rod  and then free hand caps were placed, both the superior and inferior areas,  the middle cap placed using a persuader.  With this then, each of the  fasteners were attached to the rod.  Then beginning on the left side, the  fastener was attached to the rod 80 foot pounds with the counter torque in  place.  Then on the right side, similarly.  On the left side then at the L5 level, compression obtained between the L4 and the L5 fastener and this was  then tightened to the rod at the L5 level with 80 foot pounds.  On the right  side, then compression obtained between L4 and L5 fastener.  L5 fastener  connected to the rod at 80 foot pounds.  Then on the left at S1, similarly  compression between the S1 pedicle screw and the L5 pedicle screw and  tightening the fastener to the rod with the cap at 80 foot pounds.  The  right similarly at 80 foot pounds at S1 after compressing between L5 and S1.  This completed our construct.   Carefully, a hockey stick nerve probe was then passed each of the neural  foramen, L4, L5 and S1 on the left, L4, L5 and S1 on the right.  Note that  prior to placement of the rods, each of the screws were measured for soft  tissue resistance with the lowest resistance measuring at 45 and the highest  at 72.  The level was tested.  Intraoperative EMGs did not demonstrate any  significant abnormalities during the entire  decompression procedure.  After  further irrigation then, thrombin-soaked Gelfoam applied to each of the  areas.  Careful hemostasis obtained.  Platelet-poor material was then  carefully sprayed over the operative site.  Medium Hemovac drain placed  in  the depth of the incision exiting over the left lower lumbar spine.  The  paralumbar muscles loosely approximated in the midline with interrupted #1  Vicryl sutures.  The lumbodorsal fascia approximated at the spinous process  and supraspinous ligament superiorly with interrupted figure-of-eight simple  sutures of #1 Vicryl.  Lower sacral area also approximated with interrupted  figure-of-eight simple sutures of #1 Vicryl.  The lumbodorsal fascia was  approximated in the midline with interrupted figure-of-eight sutures and  simple sutures of #1 Vicryl.  Deep subcutaneous layer approximated with  interrupted simple sutures of #1 and 0 Vicryl sutures, more superficial  layers with interrupted 2-0 Vicryl sutures and skin closed with a running  subcutaneous stitch of 4-0 Vicryl.  Tincture of benzoin and Steri-Strips  applied, 4x4s fixed to the skin with Hypafix tape and ABD pads applied as  well.  Permanent C-arm images were obtained in the AP and lateral planes for  documentation purposes.  The patient was then returned to a supine position.  In the supine position, nursing personnel did notice that the patient had  had previous breast implant.  There was some dimpling evident over the area  of her breast implant previously not felt to be present.  This was discussed  with the patient's family as well as the patient and we will await further  information as she wakes and during her hospitalization to determine if  plastic surgery consult will be necessary to determine if she has had a  rupture of her saline implant here.   COMPLICATIONS:  Possible deformity of the chest wall implant, previous  breast implant, for a mastectomy, yet to be  determined.  All instrument and sponge counts were correct.      Kerrin Champagne, M.D.  Electronically Signed     JEN/MEDQ  D:  09/25/2005  T:  09/28/2005  Job:  [id number]

## 2010-11-28 NOTE — Discharge Summary (Signed)
NAME:  Haley Harris, Haley Harris                           ACCOUNT NO.:  [id number]   MEDICAL RECORD NO.:  [id number]                   PATIENT TYPE:  ORB   LOCATION:                                       FACILITY:  [facility]   PHYSICIAN:  Ellwood Dense, M.D.                DATE OF BIRTH:  Mar 12, 1929   DATE OF ADMISSION:  03/27/2003  DATE OF DISCHARGE:  04/04/2003                                 DISCHARGE SUMMARY   DISCHARGE DIAGNOSES:  1. L2-S1 partial facetotomy, central laminectomy and decompression secondary     to stenosis.  2. History of chronic obstructive pulmonary disease/sleep apnea.  3. History of hypertension.  4. Anemia.  5. Severe osteoporosis.  6. Urinary incontinence.   HISTORY OF PRESENT ILLNESS:  The patient is a 77 year old white female with  a past medical history of hypertension, COPD and progressive back pain and  neurogenic claudication in the bilateral lower extremities.  The patient  elected to undergo a central laminectomy, L2-S1, with partial facetotomy, L2-  L3, L3-L4, and decompression, L3 and S1, by Dr. Kerrin Champagne, secondary to  severe stenosis on January 20, 2003.  The patient is presently on no DVT  prophylaxis.  PT report at this time indicates that the patient is  ambulating MIN-assist with rolling walker 180 feet, can transfer sit to  stand at supervision level.  Postoperative complications include anemia,  status post transfusion, indigestion, elevated temperature and hypokalemia.  The patient was transferred to [facility] department on March 27, 2003 for more therapies.   PAST MEDICAL HISTORY:  Prior medical history is significant for breast  cancer, mild sleep apnea, hypercholesterolemia, hypertension, GERD, COPD,  bilateral hearing loss, diverticulitis, OS.   PAST SURGICAL HISTORY:  Past surgical history is significant for right  status post mastectomy, ORIF of left wrist secondary to fracture.   MEDICATIONS PRIOR TO ADMISSION:  1.  Nifedipine 90 mg daily.  2. Calcium chloride 20 mg b.i.d.  3. Nexium 40 mg daily.  4. Vioxx 50 mg daily.  5. Aspirin 81 mg daily.  6. Zocor 40 mg daily.  7. Singulair 10 mg daily.   PRIMARY CARE Lysa Livengood:  Dr. Georgina Quint. Plotnikov.   SOCIAL HISTORY:  The patient lives alone in one tri-level home; she will  stay on the first floor.  She has local family to assist.  She was  independent prior to admission, denies any tobacco at this time.   REVIEW OF SYSTEMS:  Review of systems is significant for increased urination  and occasional ankle swelling.   FAMILY HISTORY:  Noncontributory.   HOSPITAL COURSE:  [name] was admitted to [facility]  department on March 27, 2003, where she received more than an hour and a  half of therapies daily.  Overall, [name] made great progress during her  eight-day stay in subacute.  Hospital  course was significant for the  following:   PROBLEM #1 - CENTRAL LAMINECTOMY, DECOMPRESSION AND FUSION SECONDARY TO  STENOSIS:  Overall, at the time of discharge, the patient was ambulating  greater than 300 feet at modified-independent level, can do transfers sit-to-  stand, modified-independent level.  The patient was able to don and doff  brace on her own and able to adhere to her back precautions.  She was placed  on Lovenox for DVT prophylaxis during her entire stay in rehab.  Surgical  incision healed very well and demonstrated no signs of infection.  Pain was  then controlled with oxycodone and the patient also received Skelaxin for  muscle spasms.  Overall, the patient made great progress and was discharged  at a modified-independent level with her daughter.   PROBLEM #2 - HYPOKALEMIA:  At time of admission, the patient did have a low  potassium level.  She was placed on K-Dur as a potassium supplement and this  was discontinued on April 02, 2003, as her potassium level was  normalized.   PROBLEM #3 - HISTORY OF CHRONIC  OBSTRUCTIVE PULMONARY DISEASE AND SLEEP  APNEA:  The patient had pulse oximetry performed every (q) shift.  The  patient demonstrated no hypoxia while in subacute.  She remained on Advair  and Singulair.   PROBLEM #4 - ANEMIA:  The patient was placed on Trinsicon one tablet orally  (p.o.) twice daily (b.i.d.) throughout entire stay in [facility].  Discharge  hemoglobin was 9.4 and hematocrit 27.2.   PROBLEM #5 - URINARY INCONTINENCE:  The patient complained on day 3 while in  subacute of urinary incontinence.  She was initially placed on Detrol 2 mg  p.o. q.6 h. and urinary incontinence improved significantly.   PROBLEM #6 - HYPERTENSION:  The patient's blood pressure remained well-  controlled on Procardia 90 mg p.o. daily.   PROBLEM #7 - HISTORY OF OSTEOPOROSIS:  She has remained on her Forteo 20 mcg  subcu every day at nighttime.   PROBLEM #8 - INSOMNIA:  The patient received Ambien 10 mg p.o. nightly  p.r.n. as needed.   There were no other major medical issues that occurred while the patient was  in rehab.  Latest labs reveal that her latest hemoglobin is 9.4, hematocrit  27.2, white blood count 7.1, platelets 509,000; sodium 138, potassium 3.7,  chloride 108, CO2 25, BUN 15, creatinine 0.7, glucose of 95.  The patient  did have a urinalysis performed on March 28, 2003 that demonstrated that  it was all negative except for a trace of hemoglobin.  At time of discharge,  all vitals were stable, surgical incision well-healed, demonstrating no  signs of infection.  PT report indicated the patient was ambulating 300 feet  with rolling walker at modified-independent level, could transfer sit to  stand, modified-independent level, bed mobility at modified-independent  level.  The patient could perform all ADLs at modified-independent level.  The patient was discharged home with her daughter.   DISCHARGE MEDICATIONS:  1. Singulair 10 mg daily.  2. Nexium 40 mg daily. 3. Resume  Forteo, Advair, calcium and vitamin D.  4. Mevacor 40 mg daily.  5. Aspirin 81 mg daily.  6. Prozac 40 mg daily.  7. Procardia XL 90 mg daily.  8. Detrol LA 4 mg at night.  9. Oxycodone 5 to 10 mg every four to six hours as needed.  10.      Skelaxin 40 mg one to two tablets as needed.  11.      Ambien 10 mg nightly as needed.   PAIN MANAGEMENT:  The patient's pain management will be oxycodone and  Tylenol.   DISCHARGE INSTRUCTIONS:  She is to wear her back brace when sitting up and  standing and use her back precautions.  No drinking alcohol, no driving, no  smoking.  She will have [facility] for PT and OT.   FOLLOWUP:  She is to follow up with Dr. Otelia Sergeant in two weeks, call for  appointment; follow up with Dr. Ellwood Dense as needed; follow up with her  primary care Avionna Bower, Dr. Posey Rea, in four to six weeks to monitor  hemoglobin.      Junie Bame, P.A.                       Ellwood Dense, M.D.    LH/MEDQ  D:  04/04/2003  T:  04/06/2003  Job:  [id number]   cc:   Ellwood Dense, M.D.  [address] [place]  [place] [address]  Fax: [phone]   Kerrin Champagne, M.D.  [address]  [place]  [place] [address]  Fax: [phone]   Georgina Quint. Plotnikov, M.D. [facility]

## 2010-11-28 NOTE — Discharge Summary (Signed)
Haley Harris, SKYLES NO.:  [id number]   MEDICAL RECORD NO.:  [id number]          PATIENT TYPE:  INP   LOCATION:  6711                         FACILITY:  [facility]   PHYSICIAN:  Thomos Lemons, D.O. [facility]   DATE OF BIRTH:  [birth year]   DATE OF ADMISSION:  02/10/2005  DATE OF DISCHARGE:  02/12/2005                                 DISCHARGE SUMMARY   DISCHARGE DIAGNOSES:  1.  Confusion presumed secondary to metabolic encephalopathy, rule out      infectious etiology.  2.  Probable urinary tract infection.  3.  History of recent left shoulder fracture.  4.  Hypertension.  5.  Chronic obstructive pulmonary disease.   CURRENT MEDICATIONS:  1.  Benicar 40/25 once daily.  2.  Nexium 40 mg twice daily.  3.  Singular 10 mg once daily.  4.  Prozac 20 mg once daily.  5.  Mevacor 40 mg once daily.  6.  Multivitamin once a day.  7.  Os-Cal with vitamin D twice daily.  8.  Aldactone 50 mg once daily.  9.  Cipro 250 mg twice daily x4 days.   FOLLOWUP INSTRUCTIONS:  She is to make an appointment for a primary care  visit with her physician, Dr. Posey Rea, in 1 week and she is to call the  office for an appointment.   OTHER FOLLOWUP INSTRUCTIONS:  She is to return on Monday to Interventional  Radiology for a lumbar puncture under fluoro.   HOSPITAL COURSE:  The patient is a 77 year old white female with past  medical history of hypertension, hyperlipidemia and COPD, who was admitted  for contusion, headache and forgetfulness over the past 2 days prior to  admission.  The patient lives alone.  The patient's daughters have noted  that she has been saying strange things since Sunday, some mild headache a  few days on and off prior to admission.  The patient has a history of  urinary incontinence, but has no recurrent UTIs, but was found to have a  urinary tract infection during her hospitalization.  There was concern of  possible stroke and a neurology consultation was obtained  and MRI was  eventually performed which did not reveal any acute infarct, positive white  matter changes thought to be related to sequelae of small vessel disease.  Also noted was a tiny right frontal periventricular small venous angioma  incidentally noted.  An MRA was also performed which revealed mild  intracranial atherosclerotic-type changes without large vessel occlusion.   PROBLEM #1 - CONFUSION:  After starting treatment with Cipro, patient's  mental status improved and before discharge she was awake, alert, oriented  x3 and walking without difficulty in the hallway.  The patient did complain  of some headache and the patient was scheduled for a lumbar puncture to rule  out infectious encephalopathy; however, due to aspirin, I requested the  patient return for lumbar puncture as an outpatient; this was discussed with  Dr. Orlin Hilding, who agreed that the patient could be discharged and perform  lumbar puncture as an outpatient.   PROBLEM #2 -  POLYMICROBIAL URINARY TRACT INFECTION:  The patient had  completed 2 days of Cipro; she will be discharged with Cipro 250 mg to be  taken b.i.d. over the next 4 days.   PROBLEM #3 - CHRONIC OBSTRUCTIVE PULMONARY DISEASE:  The patient's  respiratory status was stable.  The patient had mild wheezing before  discharge.   PROBLEM #4 - DYSLIPIDEMIA:  Stable on statin.   CONDITION ON DISCHARGE:  The patient's mental status has significantly  improved.  The patient and family were comfortable with the discharge plan.  The patient did not have any history of recurrent UTIs, but has what sounds  like a combination of stress and urge incontinence.  She stopped Detrol on  her own and I recommended once the patient's neurologic status is completely  stable that she can most likely restart Detrol on her own.   DISCHARGE INSTRUCTIONS:  Please note that on her discharge instructions,  instructions were given for specific tests to be run on the  cerebrospinal  fluid.      Thomos Lemons, D.O. [facility]  Electronically Signed     RY/MEDQ  D:  02/12/2005  T:  02/13/2005  Job:  [id number]   cc:   Georgina Quint. Plotnikov, M.D. [facility]  [address]  [place]  [place] [address]

## 2010-11-28 NOTE — Op Note (Signed)
[facility]  Patient:    Haley Harris, Haley Harris Visit Number: [id number] MRN: [id number]          Service Type: Attending:  Illene Labrador. Aplington, M.D. Dictated by:   Illene Labrador. Aplington, M.D. Proc. Date: 10/11/01                             Operative Report  PREOPERATIVE DIAGNOSIS:  Retained hardware left elbow, status post open reduction, internal fixation olecranon and proximal ulnar fractures.  POSTOPERATIVE DIAGNOSIS:  Retained hardware left elbow, status post open reduction, internal fixation olecranon and proximal ulnar fractures.  OPERATION:  Removal of two Steinmann pins, figure-of-eight wire, and four-hole plate and screws, left elbow.  SURGEON:  Illene Labrador. Aplington, M.D.  ASSISTANT:  Nurse.  ANESTHESIA:  General.  PATHOLOGY AND JUSTIFICATION OF PROCEDURE:  On May 28, 2001, I performed a repair of complex elbow fractures which included the olecranon, proximal ulna, and proximal radius.  The pins have been irritating her.  The olecranon fracture has healed beautifully with good reconstitution of the joint.  The proximal radius and ulnar fractures appear to be healed, but this is not definite.  There may be some fibrous union present.  All of this was discussed with Ms. Werntz prior to the surgery.  DESCRIPTION OF PROCEDURE:  Satisfactory general anesthesia, pneumatic tourniquet, left arm from wrist to tourniquet was prepped with DuraPrep and draped in a sterile field.  Arm was placed in over-the-chest position.  I first made a small incision posterior to the olecranon in the old surgical incision at this point and located the two Steinmann pins and the figure-of-eight wire.  The pins were removed, and the wire was dissected out. I then cut the double thickness wire on the ulnar side and then was able to pull on the knotted wire and remove it in toto.  I then made an incision over the distal portion of the original incision and outlined the plate  and four screws, all of which were removed.  The wounds were then irrigated with sterile saline, infiltrated with 0.5% plain Marcaine and closed with interrupted 2-0 Vicryl in the fascia deep, 3-0 Vicryl in the superficial subcutaneous tissue, and running 4-0 nylon on the skin.  Betadine, Adaptic dry sterile dressing, and a sling were applied.  She tolerated the procedure well and was taken to the recovery room in satisfactory condition with no known complications. Dictated by:   Illene Labrador. Aplington, M.D. Attending:  Illene Labrador. Aplington, M.D. DD:  10/11/01 TD:  10/11/01 Job: [id number]

## 2010-11-28 NOTE — H&P (Signed)
[facility]  Patient:    Haley Harris, Haley Harris Visit Number: [id number] MRN: [id number]          Service Type: SUR Location: 4W [id number] [id number] Attending Physician:  Marlowe Kays Page Dictated by:   Ottie Glazier. Wynona Neat, P.A.-C. Admit Date:  01/10/2002 Discharge Date: 01/12/2002                           History and Physical  The patient underwent an ORIF of her elbow on January 10, 2002.  This is a belated dictation.  CHIEF COMPLAINT:  Left elbow pain.  HISTORY OF PRESENT ILLNESS:  The patient is a 77 year old female who is status post an ORIF of left elbow in November 2002, secondary to complex fracture involving the olecranon and proximal ulna and proximal radius.  She is status post removal of the hardware.  She has had continued pain and disability in the left elbow and is seen on a continued bases per Dr. Simonne Come.  Secondary to her symptomatology, he has discussed operative intervention with the patient in the form of an ORIF versus radial head replacement.  The patient is in agreement with this plan and wishes to proceed.  ALLERGIES:  KEFLEX gives her facial swelling.  BIAXIN, gastrointestinal problems.  MEDICATIONS:  1. Procardia XL.  2. Prozac 20.  3. Potassium 8.  4. Forteo.  5. Vioxx 50 mg q.d.  6. Claritin 10 mg.  7. Advair twice daily.  8. Darvocet-N 100 1 p.o. q.4h. p.r.n.  9. Sonata 10 mg p.o. q.h.s. p.r.n. 10. Vitamins C and E. 11. Osteo Bi-Flex with calcium.  Will need to verify the dosages of these medicines as she is unaware of these.  PAST MEDICAL HISTORY:  1. Anxiety.  2. Depression.  3. COPD.  4. Eczema.  5. Hiatal hernia.  6. Gastroesophageal reflux disease.  7. Diverticulitis.  8. Sleep apnea.  9. Constipation. 10. History of breast cancer. 11. Hypertension. 12. 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  PAST SURGICAL HISTORY:  1. Mastoid surgery.  2. Appendectomy.  3. Right elbow surgery.  4. Right breast mastectomy  with implant.  5. Hysterectomy.  SOCIAL HISTORY:  The patient is widowed.  She currently works as a Teacher, music.  She stopped smoking in 1976.  She drinks one to two alcoholic beverages a day.  She has three children.  FAMILY HISTORY:  Her father had heart disease and hypertension.  Mother had ovarian cancer, breast cancer, and arthritis.  Brother had lung cancer.  REVIEW OF SYSTEMS:  GENERAL:  The patient states she has occasional night sweats.  However, denies any recent weight loss, fever, or chills.  HEENT: She does wear corrective lenses.  Denies any chronic headaches, ringing of the ears, runny nose, or sore throat.  CHEST:  She has an occasional cough secondary to seasonal allergies.  Otherwise, no productive cough or hemoptysis.  HEART:  She denies any chest pain, irregular heartbeats, shortness of breath, syncopal episodes.  GASTROINTESTINAL/GENITOURINARY:  Has constipation.  No history of chronic diarrhea, bright red stools per rectum, melena, dysuria, frequency, or urgency, but she does have nocturia. EXTREMITIES:  Please see HPI.  NEUROLOGIC:  Denies any history of seizures or strokes.  PHYSICAL EXAMINATION:  VITAL SIGNS:  Blood pressure 150/______ , pulse 68, respirations 12.  GENERAL:  Pleasant 77 year old female in no acute distress.  HEENT:  Head atraumatic, normocephalic.  NECK:  Supple.  Without masses or carotid  bruits.  CHEST:  Clear to auscultation bilaterally.  BREASTS:  Deferred, not pertinent to present illness.  HEART:  Regular rate and rhythm.  S1, S2.  ABDOMEN:  Soft and nontender.  Bowel sounds are positive x 4 quadrants.  GENITOURINARY:  Deferred, not pertinent to present illness.  EXTREMITIES:  Examination of the left elbow shows that she has painful range of motion with the elbow, pain with supination and pronation.  NEUROLOGIC:  Intact.  LABORATORY DATA:  X-rays show a fracture of the proximal ulna that seems to  be consolidating.  However, on the AP projection the radial head is completely off and locked between the proximal radius and the ulna.  IMPRESSION:  1. Displaced radial head fracture with proximal ulnar fracture.  2. Anxiety.  3. Depression.  4. COPD.  5. Eczema.  6. Hiatal hernia.  7. Gastroesophageal reflux disease.  8. Diverticulitis.  9. Sleep apnea. 10. Constipation. 11. History of breast cancer. 12. Hypertension. 13. 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  PLAN:  The patient will be admitted to [facility] to undergo an ORIF versus total radial head replacement of the left elbow per Dr. Simonne Come on January 10, 2002, at 7:30 a.m.  The risks and benefits of this surgery have been discussed with the patient prior to entering the operating suite. Dictated by:   Ottie Glazier. Wynona Neat, P.A.-C. Attending Physician:  Joaquin Courts DD:  01/27/02 TD:  01/31/02 Job: [id number]

## 2010-11-28 NOTE — Consult Note (Signed)
Haley Harris, Haley Harris NO.:  [id number]   MEDICAL RECORD NO.:  [id number]          PATIENT TYPE:  EMS   LOCATION:  MAJO                         FACILITY:  [facility]   PHYSICIAN:  Gustavus Messing. Orlin Hilding, M.D.DATE OF BIRTH:  [birth year]   DATE OF CONSULTATION:  02/10/2005  DATE OF DISCHARGE:                                   CONSULTATION   CHIEF COMPLAINT:  Confusion for 2 days.   HISTORY OF PRESENT ILLNESS:  Haley Harris is a 77 year old right handed white  woman with a history of hypertension and hyperlipidemia but generally  independent and active. Five weeks ago, she tripped on a ramp and fractured  her left shoulder. There was not anything unusual about the fall. It was  accidental. She did not have any per-existing weakness or clumsiness and did  not have any following that. She has had pain in that shoulder. She has not  required surgery. However, she was given Tramadol for the pain. On Sunday,  she took Tramadol and wine and the family noted some confusion, which  persisted throughout yesterday and today. She was confused about dates and  people. She threw some money into a trash can. Was not making a lot of sense  with some of the things that she was saying. She was found splashing in a  fountain and all of this was out of character for her. She denies any  headache, chest pain, weakness, numbness, vision problems. She does have a  chronic cough. She does have pain in the left shoulder. No dysuria. No  fever.   PAST MEDICAL HISTORY:  Significant for hypertension, hyperlipidemia, recent  left shoulder fracture, remote breast cancer status post left mastectomy  about 20 years ago. No recurrence. Hysterectomy for cancer, remote. What  appears to be an umbilical hernia, asthma, chronic obstructive pulmonary  disease, arthritis, and gastroesophageal reflux disease.   MEDICATIONS:  Nexium, Benicar, spironolactone, Singulair, Fluoxetine,  Lovastatin, vitamin C,  multivitamin, glucosamine, calcium and aspirin. She  told me that she was also on Fosamax but I do not see that listed. P.r.n.  medications include Mucinex, Tramadol, Tylenol, propoxyphene, stool  softener, Sonata, nasal saline, and Bromfenex.   ALLERGIES:  No known drug allergies.   SOCIAL HISTORY:  She lives by herself. She is active and independent. She is  a remote cigarette user. She does drink alcohol. She does have children.   FAMILY HISTORY:  Noncontributory.   PHYSICAL EXAMINATION:  VITAL SIGNS:  Temperature 98.6, pulse 96, blood  pressure 148/86, respiratory rate 20, 95% O2 saturation on room air.  HEENT:  Head is normocephalic and atraumatic.  NECK:  Without bruits. Guarding of the left shoulder.  HEART:  Regular rate and rhythm.  NEUROLOGIC:  She is awake, alert, and keenly responsive with a clear  sensorium. She does not appear encephalopathic, overdosed, or sedated.  However, she gave the day as September 03, 1998 and said that it was winter.  She is oriented to hospital. She registers 3 out of 3 items but can only  recall 2 out of 3. She spelled  world forward correctly but backwards,  spelled it dlor. She is able to name objects, repeat, follow complex  commands and has fluent speech. Cranial nerves, her pupils are equal and  reactive. Visual fields are full to confrontation. Extraocular movements are  intact. Facial sensation, there is normal facial motor activities. Normal  hearing is intact. Palate is symmetric and tongue is midline and there is no  dysarthria. On motor examination, she has got 5 over 5 strength in the  bilateral lower extremities and right upper extremity but she is guarding  the left arm because of pain. It is at least 4 over 5 but I really cannot  rule out weakness. Deep tendon reflexes are 1+ with down going toes.  Coordination, finger to nose is normal bilaterally, allowing for the left  arm pain and bilateral heel to shin is normal. Sensory  examination is  normal.   LABORATORY DATA:  CT of the brain shows a hypo-density in the right mid  frontal subcortical region of indeterminate age. To me, it was read  as  possible acute stroke but I don't know that we can tell that.   Labs are fairly unremarkable.   IMPRESSION:  Confusion for 2 days with possible right frontal stroke. To me,  the CT is age indeterminate. This could be sub-acute or chronic. She has a  non-focal examination but I cannot really fully assess the left arm because  of the pain from the fracture. I can not rule out a 2 day old stroke but at  any rate, she is not a candidate for any acute intervention such as TPA or  enrollment in a study.   RECOMMENDATIONS:  Get an MRI of the brain with MR angiogram to evaluate the  age of the hypo-density and whether there is any vascular disease. Determine  if that is actually a stroke or not. Will get an electroencephalogram, 2-D  echocardiogram, TSH, and homocystine level and lipids.      Catherine A. Orlin Hilding, M.D.  Electronically Signed     CAW/MEDQ  D:  02/10/2005  T:  02/11/2005  Job:  [id number]   cc:   Georgina Quint. Plotnikov, M.D. [facility]  [address]  [place]  [place] [address]

## 2010-11-28 NOTE — Op Note (Signed)
[facility]  Patient:    Haley Harris, Haley Harris Visit Number: [id number] MRN: [id number]          Service Type: OBV Location: 4W [id number] [id number] Attending Physician:  Marlowe Kays Page Proc. Date: 05/28/01 Admit Date:  05/28/2001                             Operative Report  PREOPERATIVE DIAGNOSIS:  Complex fractures left elbow involving olecranon, proximal ulnar, and proximal radius.  POSTOPERATIVE DIAGNOSIS:  Complex fractures left elbow involving olecranon, proximal ulnar, and proximal radius.  PROCEDURE PERFORMED: 1. Open reduction internal fixation olecranon fracture using    figure-of-eight wire technique and tension band principal. 2. Open reduction internal fixation proximal ulnar fracture using    four hole small fragment plate.  SURGEON:  Illene Labrador. Aplington, M.D.  ASSISTANT:  Nurse.  ANESTHESIA:  General.  INDICATIONS:  The patient fell while in [facility] approximately one week ago injuring her left elbow.  She had x-rays there and was placed in a long arm splint.  I saw her in the office two days ago and the olecranon fractures were displaced and the proximal radial fractures were not.  I felt that ORIF would be indicated, and that is why she is here today.  DESCRIPTION OF PROCEDURE:  Prophylactic antibiotics and satisfactory general anesthesia, pneumatic tourniquet.  The left arm was prepped with Duraprep from wrist to the tourniquet and draped in a sterile field.  I made a posterior radial incision, centered first in the midline over the subcutaneous border of the ulnar, then going along the radial side of the olecranon and then to the distal humeral area in the midline.  The incision was carried down to the fracture site.  The proximal ulnar was exposed subperiosteally.  After cleaning the fracture site of clot and material with curet and rongeur, and irrigating it well, I was able to match up the articular surfaces and then placed two  smooth Steinmann pins in a parallel fashion through the posterior olecranon through the reduced fracture into the proximal ulnar.  Position was monitored with the C arm, and I had to change position of the pin several times until position was where I wanted down the ulnar shaft.  We placed a transverse drill hole in the proximal subcutaneous border of the ulnar and placed two 20-gauge wires and then in figure-of-eight fashion, I loosely stabilized the fracture site, and at the same time would note the position of the pins, and when I was happy with the position of the fracture of the olecranon and pins, I cinched the wire down tightly in a figure-of-eight fashion, cut and bent it, and also bent and cut the pins.  The ulnar fracture proximally was somewhat distracted by the tension band elevating up the more proximal olecranon fragment and consequently I extended the incision towards the hand and exposed the fracture site.  I cleared soft tissue material and then held it reduced while I applied a four hole small fragment plate.  She also had a large fragment radial in the intercondylar area, which I found to be fairly inaccessible, and I was concerned about iatrogenic damage to neurovascular structures, and since it was in reasonably good position, I elected to leave it.  One of the screws from the plate was able to fix this fracture fragment.  I was able to extend into the fracture fragment.  When I was satisfied  that the elbow joint and ulnar were in good position, and because the radius appeared to be also in good position and did not need anything done surgically, I went ahead and irrigated the wound and then began closure.  The deep muscle I reapproximated with running 0 Vicryl, the fascia with interrupted 0 Vicryl, the subcutaneous tissue with 2-0 and 3-0 Vicryl, and the skin with staples.  The tourniquet was released prior to complete closure at two hours of tourniquet time.  There was  no unusual bleeding. Betadine, Adaptic, dry sterile dressing, and a well padded long arm splint cast was applied.  She tolerated the procedure well and was taken to the recovery room in satisfactory condition with no known complications. Attending Physician:  Joaquin Courts DD:  05/28/01 TD:  05/28/01 Job: [id number]

## 2010-11-28 NOTE — Procedures (Signed)
HISTORY:  This is a 77 year old with altered mental status who is having EEG  done to evaluate for seizure activity.   PROCEDURE:  This is a routine EEG.   TECHNICAL DESCRIPTION:  Throughout this routine EEG, there is a posterior  dominant rhythm of 9 hertz activity at 30 to 50 microvolts. The background  activity is mostly comprised of alpha with admixed delta and theta range  activity at 35 to 50 microvolts. Intermittently noticed throughout the  tracing there are rare intermittent slowing noticed in the left frontal  central head region that is not seen on the left. Also noticed fairly  frequently throughout the tracing primarily in the beginning of the tracing  are bilateral triphasic contoured slow waves with a frontal predominance.  With photic stimulation, there is a symmetrical photic driving response.  Hyperventilation was not performed throughout this recording. The patient  does become drowsy. However, does not enter stage 2 sleep. Throughout this  record, there is no evidence of ultragraphic seizures or interictal  discharge activity.   IMPRESSION:  This routine EEG is abnormal secondary to diffuse background  slowing, intermittent focal left frontal slowing, and triphasic contoured  slow waves. The diffuse slowing is suggestive of a toxic metabolic or  primary neuronal disorder. The intermittent left frontal slowing could be  suggestive of an ongoing structural lesion. The triphasically contoured slow  waves are seen in various encephalopathies, most common in renal and hepatic  encephalopathies. Clinical correlation is advised.       NGE:XBMW  D:  02/13/2005 12:24:51  T:  02/13/2005 13:53:28  Job #:  [id number]

## 2010-11-28 NOTE — Op Note (Signed)
NAME:  Haley Harris, BLICK                           ACCOUNT NO.:  [id number]   MEDICAL RECORD NO.:  [id number]                   PATIENT TYPE:  INP   LOCATION:  0482                                 FACILITY:  [facility]   PHYSICIAN:  Marlowe Kays, M.D.               DATE OF BIRTH:  Aug 28, 1928   DATE OF PROCEDURE:  06/01/2002  DATE OF DISCHARGE:                                 OPERATIVE REPORT   PREOPERATIVE DIAGNOSIS:  Loss of fixation of proximal ulnar fracture with  subluxation of titanium radial head prosthesis left elbow.   POSTOPERATIVE DIAGNOSIS:  Loss of fixation of proximal ulnar fracture with  subluxation of titanium radial head prosthesis left elbow.   PROCEDURE:  1. Removal of radial head prosthesis.  2. Removal of broken plate from ulna with takedown of fibrous union and     after complication of AcroMed olecranon plate and application of left     anterior iliac crest bone graft.   SURGEON:  Marlowe Kays, M.D.   ASSISTANT:  Dionne Ano. Amanda Pea, M.D.   ANESTHESIA:  General.   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  The elbow has drifted into varus with subluxation of the radial head  laterally and deformity of the proximal ulna consistent with loss of  fixation.  X-ray had found that the ulnar plate had broken and that she had  fibrous unit at the fracture site despite using a bone stimulator.  There is  no evidence of infection.   DESCRIPTION OF PROCEDURE:  Prophylaxis antibiotics.  Pneumatic tourniquet  left upper extremity.  The left upper extremity from tourniquet to rest and  the left iliac crest were prepped with DuraPrep and draped in a sterile  field.  Going through the old surgical incision proximal to the olecranon  curving on the radial side and along the subcutaneous  border of the ulna I  undermined the superior flap taking an interval between the anconeus and the  extensor carpi ulnaris, and we were able to remove the titanium prosthesis.  There was no evidence for any infection.  I then closed this wound with  running #1 Vicryl.  The ulnar plate was found to be broken at the fracture  site and was removed and the fibrous union meticulously taken down with a  combination of rongeur, curetting of the ends of the ulna and drilling and  curetting of the canal of the ulna proximally and distally.  We then fitted  her with an 8-hole AcroMed olecranon and ulnar plate which gave good  stability distal to the fracture site.  Primary fixation was utilized with a  2.7 screw of the posterior olecranon and distally with 3.5 mm  cortical screw  after the usual drilling, depth gauging and application of the screw.  This  brought the ulna firmly up against the plate.  We took the preliminary C-arm  and traced in AP and lateral projections and the fracture was in good  position and the screw was not in the olecranon fossa.  We then completed  the fixation with multiple 3.5 mm screws distal to the curved portion of the  olecranon with a 65 mm x 3.5 screw down the ulna which traversed the  fracture site so it went into the distal and out of the ulna as well.  The  proximal portion of the plate stabilization was then completed with 3.5 mm  screws with excellent position on the traction screws on C-arm, AP and  lateral projections.  In the meantime, I opened up the anterior iliac crest  and the Bovie subperiosteal dissection exposed the crest.  I took and edge  of outer cortex and lateral cortex with 3/4 inch osteotome leaving the  superior portion of the crest intact.  After removing this window we were  able to remove a lot of nice cancellous bone which we packed in the nonunion  site prior to final fixation with the 3.5 x 65 mm screw.  The iliac crest  wound was  irrigated with sterile saline with bone wax placed on the raw bone  and with the cavity packed with Gelfoam, soft tissues were infiltrated with  0.5% plain Marcaine and closure performed with interrupted #1 Vicryl in the  fascia, 2-0 Vicryl in the subcutaneous tissue and staples in the skin.  When  the elbow tourniquet was released, there was no unusual bleeding.  After  irrigating the wound well with sterile saline the muscle and fascia were  closed in two layers over the subcutaneous border of the ulna with  interrupted #1 Vicryl and in one layer over the olecranon with the same.  Subcutaneous tissue was closed with 2-0 Vicryl, skin with staples.  Betadine, Adaptic, dry sterile dressing and long arm splint cast was  applied.  She tolerated the procedure well and was taken to the recovery  room in satisfactory condition with no known no complications.                                               Marlowe Kays, M.D.    JA/MEDQ  D:  06/01/2002  T:  06/01/2002  Job:  [id number]

## 2010-11-28 NOTE — Op Note (Signed)
NAME:  Haley Harris, Haley Harris                           ACCOUNT NO.:  [id number]   MEDICAL RECORD NO.:  [id number]                   PATIENT TYPE:  INP   LOCATION:  5003                                 FACILITY:  [facility]   PHYSICIAN:  Kerrin Champagne, M.D.                DATE OF BIRTH:  10/17/1928   DATE OF PROCEDURE:  03/23/2003  DATE OF DISCHARGE:                                 OPERATIVE REPORT   PREOPERATIVE DIAGNOSES:  Lumbar spinal stenosis, mild L2-3, moderate L3-4,  severe L4-5 with lateral recess stenosis bilateral L5-S1 and herniated  nucleus pulposis left L5-S1.  Degenerative spondylolisthesis L4-5 grade I  and L5-S1 grade I.   POSTOPERATIVE DIAGNOSES:  Lumbar spinal stenosis, mild L2-3, moderate L3-4,  severe L4-5 with lateral recess stenosis bilateral L5-S1 and herniated  nucleus pulposis left L5-S1.  Degenerative spondylolisthesis L4-5 grade I  and L5-S1 grade I and intraoperative dural tear, right central superior  aspect of L4.   PROCEDURE:  Central decompressive laminectomy L2-3, L3-4, L4-5 and L5-S1  with partial medial facetectomy L2-3, L3-4, L4-5 and L5-S1, removal of the  spinous process of L3, L4 and L5 and the central portions of the lamina of  L3, L4 and L5.  Decompression of bilateral L3, L4 and L5 and S1 nerve roots.  Diskectomy left L5-S1.  Posterolateral fusion L4-5 and L5-S1 utilizing local  bone graft and allograft bone graft material in the form of Symphony  material.  Repair of dural leak, dural tear, right superior aspect of L4  using a single 4-0 Nurolone suture and Fibrin glue.   SURGEON:  Kerrin Champagne, M.D.   ASSISTANT:  Wende Neighbors, P.A.   ANESTHESIA:  GOT, Dr. Jean Rosenthal.   ESTIMATED BLOOD LOSS:  250 cc.   DRAINS:  Hemovac x1.   BRIEF CLINICAL HISTORY:  The patient is a 77 year old female with a history  of progressive worsening and neurogenic claudication in both lower  extremities.  The pain is also in the distribution of S1 on the left  side.  She has pain both with sitting and standing and walking.  Studies  preoperatively have demonstrated severe spinal stenosis at the L4-5 level  with grade I spondylolisthesis at L4-5 and at L5-S1.  Bilateral lateral  recess stenosis at L5-S1 with an HNP on the left side at L5-S1 affecting the  left S1 nerve root and the left side of the thecal sac here.  Moderately  severe spinal stenosis at L3-4 and mild stenosis at L2-3.  This patient,  after undergoing attempts at conservative management including epidural  steroid therapy, she is brought to the operating room to undergo central  decompressive laminectomy for stenosis with bilateral decompression at the  L2-3, L3-4, L4-5 and L5-S1 levels with posterolateral fusion, diskectomy  left L5-S1.   INTRAOPERATIVE FINDINGS:  Moderately large disk protrusion left L5-S1  affecting the left side of the  theca sac and left S1 nerve root.  Bilateral  foraminal stenosis associated with degenerative spondylolisthesis at the L5-  S1 level affecting the L5 nerve roots.  Bilateral neural foraminal narrowing  and foraminal stenosis affecting the L4 nerve roots with degenerative  spondylolisthesis at the L4-5 level.  Mild foraminal narrowing of L3-4  affecting bilateral L3 nerve roots and mild lateral recess stenosis  affecting the L3 nerve roots at the L2-3 level.  Severe hypertrophic changes  involving the medial aspect of the facets at both L4-5 and L5-S1 with  hypertrophic changes of the ligamentum flavum here.  Small dural tear  occurring right side at the superior aspect of L4, approximately 1 mm in its  size with the arachnoid layer protruding.  No neural elements involved.  No  leakage.  This was closed with a single 4-0 Nurolon suture and Fibrin glue.   DESCRIPTION OF PROCEDURE:  After adequate general anesthesia, with the  patient in a knee-chest position, Andrews frame, standard preoperative  antibiotics of vancomycin, standard prep with  Duraprep solution, draped in  the usual manner.  Haley Harris pressure points well padded.  An iodine Vi drape was  used.  The incision in the midline from the expected L2 level to  approximately S2 through the skin and subcutaneous layers using a 10 blade  scalpel, electrocautery used to divide subcutaneous layers down to the  lumbar dorsal fascia, extending from L2 to S2.  Cobb's then used to elevate  the paralumbar muscle of the dorsal fascia off the spinous processes of S1,  L5, L4, L3 and L2.  This is done bilaterally.  Cobb's then used to elevate  the paralumbar muscles off the posterior aspects of the spinous process and  the lamina of L5-S1 over L4, L3, and L2.   Intraoperative lateral radiograph obtained was clamped on the spinous  process of the superior aspect of L5 and the superior aspect of L3.  McCollough retractors then inserted and the exposure carried out, removing  the facet capsules at the L5-S1 level, L4-5 level bilaterally then exposing  the transverse process of L5 at the L4-5 level of facet and at the L4  transverse process at the facet at L3-4, preserving the facet capsule at L3-  4.   Bleeders were controlled using electrocautery.   Soft tissue attachments to the posterior aspect of the lamina excised using  Wetzel rongeur bilaterally and the bone completely denuded of soft tissue  attachments in order to allow for its use in bone grafting.  The lateral  gutters then packed after exposure of the bilateral sacra ala at the S1  level both sides.  The packing performed from the transverse process of L4  to L5 and then to the ala bilaterally.  The [facility] rongeur then used to  remove the spinous process of L5, L4 and L3 and about half of the inferior  aspect of the spinous process of L2.  Wetzel rongeur then used to thin the  posterior aspect of the lamina of L3, L4 and L5.  A 0.3 and 0.5 inch  osteotomes were used to perform partial facetectomy in the medial aspect of the  inferior articular process of L5 bilaterally, the inferior aspect of L4  bilaterally and the inferior aspect of L3 bilaterally and at the L2-3 as  well.  A 3 mm Kerrison was used to debrid the lamina centrally, performing a  central laminectomy and then this was carried out laterally.  Excising  medial facets  to the level of the pedicle of the S1 level at the L5-S1 level  and to the level of the pedicle of L5 at the L4-5 level bilaterally.  There  was severe lateral recess and severe central stenosis at L4-5 and at the L5-  S1, severe lateral recess stenosis.  These were decompressed using 2 and 3  mm Kerrison's.  Excising the hypertrophic ligamentum flavum performing  foraminotomies bilaterally of the L5 and L4 levels.  Small dural tear  occurred in the superior aspect of the lamina on the right side at L4.  This  had the appearance of a bleb about a 1 mm tear.  This was covered and  protected throughout the case for later repair.  The central portions of the  lamina at L3 were resected bilaterally and then the ligamentum flavum  resected at L2-3 and at L3-4.  Partial medial facetectomy performed at the  L2-3 level, removing approximately 10% both sides at this level,  decompressing the L3 nerve roots within the lateral recess.  The L3 nerve  root was decompressed within their foramen, both sides, excising the  hypertrophic ligamentum flavum impinging upon these nerve roots over the  medial aspect of the facet and anterior to the facet.  After adequate  decompression was carried out centrally, then the S1 nerve root on the left  side and the left lateral aspect of the thecal sac were retracted medially  at the L5-S1 level and disk herniated noted to be present.  The 15 blade  scalpel was used to perform cruciate incision in the disk here and the disk  was then excised using pituitary rongeurs until the disk was felt to be  completely excised, that which was herniated central and leftward.   With  this, then the L5-S1 nerve roots were completely decompressed.   Irrigation was performed.  Bone wax was applied to the bleeding cancellous  bone surfaces, along the medial facetectomies at each segment and excess  bone wax was removed.  Thrombin-soaked Gelfoam was placed within the lateral  recesses in order to obtain adequate hemostasis.  When adequate hemostasis  was obtained, then repair of the dural tear was carried out using a single 4-  0 Nurolon suture, a simple suture was sewn, reapproximating the dural edges  and replacing the arachnoid layer within the sac.  It was felt that Fibrin  glue would assist in the closure here and provide a watertight seal.  So the  Fibrin glue was mixed.  Exposure was then obtained over the posterolateral  element, exposing the transverse process of L4, L5 and the sacral ala.  These were decorticated using the high speed bur along the right side and  the facet joints at L4-5 and L5-S1 decorticated using a high speed bur. Similarly, this was carried out on the left side from the transverse process  of L4 and L5 to the sacral ala.  Bone graft had been obtained from the  central laminectomy extending from L3, L4-5 to S1 was then mixed with  Allometric bone graft material to obtain a Symphony-type of graft material.  This was then placed within the posterolateral gutters, extending from the  L4 transverse process to L5 to the sacral ala on the right side, then on the  left side.  Two large logs were obtained and abundant bone graft was  present, providing adequate bed of bone graft, both sides.  With this then,  Gelfoam was placed over the bilateral posterolateral fusion  masses.  Irrigation was performed centrally.  A single Hemovac drain was placed in  the depth of the incision after Valsalva showed no evidence of dural  leakage.  The lumbar dorsal fascia was then reapproximated after first  loosely approximating the paralumbar muscles in the  midline with interrupted  #1 Vicryl sutures.  The lumbar dorsal fascia was approximated with  interrupted #1 Vicryl sutures in a simple figure-of-eight fashion, deep  subcutaneous layers approximated with interrupted #1-0 Vicryl sutures, more  superficial layers with interrupted 2-0 Vicryl sutures and the skin closed  with a running subcutaneous stitch of 4-0 Vicryl.  Tincture Benzoin and  Steri-Strips were applied, 4 x 4's, ABD pad, affixed to the skin with a  hyper fix tape.  The patient was then returned to the supine position,  reactivated and extubated and returned to the recovery room in satisfactory  condition.  Haley Harris sponge and instrument counts were correct.                                                Kerrin Champagne, M.D.    JEN/MEDQ  D:  03/23/2003  T:  03/24/2003  Job:  [id number]

## 2010-11-28 NOTE — Discharge Summary (Signed)
[facility]  Patient:    Haley Harris, Haley Harris Visit Number: [id number] MRN: [id number]          Service Type: SUR Location: [id number] Attending Physician:  Marlowe Kays Page Dictated by:   Della Goo, P.A.-C Admit Date:  05/28/2001 Disc. Date: 05/30/01                             Discharge Summary  ADMISSION DIAGNOSES: 1. Complex fractures left elbow involving olecranon proximal ulna and proximal    radius. 2. Chronic obstructive pulmonary disease. 3. Benign adenoma of right renal gland. 4. Hiatal hernia with reflux disease. 5. Constipation. 6. Sleep apnea requiring CPAP on an as needed basis. 7. History of breast cancer, status post right mastectomy in 1988. 8. Hypertension.  DISCHARGE DIAGNOSES: 1. Complex fractures left elbow involving olecranon proximal ulna and proximal    radius. 2. Chronic obstructive pulmonary disease. 3. Benign adenoma of right renal gland. 4. Hiatal hernia with reflux disease. 5. Constipation. 6. Sleep apnea requiring CPAP on an as needed basis. 7. History of breast cancer, status post right mastectomy in 1988. 8. Hypertension.  PROCEDURE:  On 05/28/01, the patient underwent open reduction internal fixation of olecranon fracture using ______ ______ wire technique and tension band principal.  Also open reduction internal fixation proximal ulnas fracture using 4 hole small fragment plate performed by Dr. Simonne Come, assisted by the OR nurse under general anesthesia.  CONSULTATIONS:  None.  HISTORY OF PRESENT ILLNESS:  The patient is a 77 year old white female who fell in [facility] approximately one week prior to this time.  She injured her left elbow there.  She was placed in a long arm splint.  She was seen in Dr. Dawayne Cirri office this past week, and felt to require surgical intervention. She was admitted for the procedure as stated above.  HOSPITAL COURSE:  The patient tolerated the procedure without  difficulty. Postoperatively, neurovascular motor function of the left hand was noted to be intact, however, the patient had some complaints of subjective tingling in the ring and long finger since surgery.  She was encouraged in motion of the fingers and edema control.  Initially, she utilized PCA analgesics without difficulty, and was weaned to p.o. analgesics without difficulty.  She received the usual postoperative antibiotics for 24 hours.  The patient required an additional overnight stay to monitor her neurovascular status of the left hand.  On 11/18, she was felt to be stable for discharge home.  At that time she was afebrile with vital signs stable.  Neurovascular motor function was intact in the left hand, and her tingling had decreased. Swelling was moderate and acceptable.  CONDITION ON DISCHARGE:  Stable.  PERTINENT LABORATORY DATA:  On admission BMET showed values within normal limits.  Hemoglobin and hematocrit were 11.5 and 32.4, respectively.  Chest x-ray on admission showed stable chest, no active disease.  This chest x-ray was actually taken at [facility] on 12/16/00, and utilized for this procedure.  EKG showed normal sinus rhythm.  PLAN:  The patient is being discharged to her home.  She will be staying with her daughter as she usually lives alone.  She will follow up with Dr. Simonne Come in 10 to 14 days.  She has been instructed to continue elevation of the left upper extremity and to move fingers often.  She will keep her splint dry and clean at all times.  The patient  is given prescriptions for Percocet 5 mg #40 one or two q.4-6h. p.r.n. pain, Robaxin 500 mg #30 one q.8h. p.r.n. spasm.  She will continue all home medications, and use over-the-counter stool softener, laxative, or enema as needed.  She will continue on a regular diet. The patient will call the office if she has questions or concerns prior to her return office visit. Dictated by:   Della Goo, P.A.-C Attending Physician:  Joaquin Courts DD:  05/30/01 TD:  05/30/01 Job: [id number]

## 2011-01-13 ENCOUNTER — Emergency Department (HOSPITAL_COMMUNITY)
Admission: EM | Admit: 2011-01-13 | Discharge: 2011-01-13 | Disposition: A | Discharge status: Home / Self Care | Payer: Medicare Other | Attending: Emergency Medicine | Admitting: Emergency Medicine

## 2011-01-13 ENCOUNTER — Emergency Department (HOSPITAL_COMMUNITY): Payer: Medicare Other

## 2011-01-13 DIAGNOSIS — J4489 Other specified chronic obstructive pulmonary disease: Secondary | ICD-10-CM | POA: Insufficient documentation

## 2011-01-13 DIAGNOSIS — I517 Cardiomegaly: Secondary | ICD-10-CM | POA: Insufficient documentation

## 2011-01-13 DIAGNOSIS — R0602 Shortness of breath: Secondary | ICD-10-CM | POA: Insufficient documentation

## 2011-01-13 DIAGNOSIS — R509 Fever, unspecified: Secondary | ICD-10-CM | POA: Insufficient documentation

## 2011-01-13 DIAGNOSIS — R51 Headache: Secondary | ICD-10-CM | POA: Insufficient documentation

## 2011-01-13 DIAGNOSIS — IMO0001 Reserved for inherently not codable concepts without codable children: Secondary | ICD-10-CM | POA: Insufficient documentation

## 2011-01-13 DIAGNOSIS — J189 Pneumonia, unspecified organism: Secondary | ICD-10-CM | POA: Insufficient documentation

## 2011-01-13 DIAGNOSIS — R059 Cough, unspecified: Secondary | ICD-10-CM | POA: Insufficient documentation

## 2011-01-13 DIAGNOSIS — M129 Arthropathy, unspecified: Secondary | ICD-10-CM | POA: Insufficient documentation

## 2011-01-13 DIAGNOSIS — I10 Essential (primary) hypertension: Secondary | ICD-10-CM | POA: Insufficient documentation

## 2011-01-13 DIAGNOSIS — R0789 Other chest pain: Secondary | ICD-10-CM | POA: Insufficient documentation

## 2011-01-13 DIAGNOSIS — M810 Age-related osteoporosis without current pathological fracture: Secondary | ICD-10-CM | POA: Insufficient documentation

## 2011-01-13 DIAGNOSIS — R05 Cough: Secondary | ICD-10-CM | POA: Insufficient documentation

## 2011-01-13 DIAGNOSIS — J449 Chronic obstructive pulmonary disease, unspecified: Secondary | ICD-10-CM | POA: Insufficient documentation

## 2011-01-13 LAB — BASIC METABOLIC PANEL
Chloride: 93 mEq/L — ABNORMAL LOW (ref 96–112)
GFR calc Af Amer: >60 mL/min (ref >60)
Potassium: 3.6 mEq/L (ref 3.5–5.1)
Sodium: 131 mEq/L — ABNORMAL LOW (ref 135–145)

## 2011-01-13 LAB — DIFFERENTIAL
Basophils Absolute: 0 10*3/uL (ref 0.0–0.1)
Basophils Relative: 0 % (ref 0–1)
Eosinophils Absolute: 0.1 10*3/uL (ref 0.0–0.7)
Monocytes Relative: 8 % (ref 3–12)
Neutro Abs: 10.2 10*3/uL — ABNORMAL HIGH (ref 1.7–7.7)
Neutrophils Relative %: 87 % — ABNORMAL HIGH (ref 43–77)

## 2011-01-13 LAB — CBC
Hemoglobin: 12.1 g/dL (ref 12.0–15.0)
MCH: 33.9 pg (ref 26.0–34.0)
MCHC: 35.7 g/dL (ref 30.0–36.0)
Platelets: 147 10*3/uL — ABNORMAL LOW (ref 150–400)
RBC: 3.57 MIL/uL — ABNORMAL LOW (ref 3.87–5.11)

## 2011-01-15 ENCOUNTER — Telehealth: Payer: Self-pay | Admitting: *Deleted

## 2011-01-15 NOTE — Telephone Encounter (Signed)
Patient went to [facility] ER Tuesday night and was diagnosed with Pneumonia. She says that she was told to call to schedule follow up with you, but you have no openings until Tuesday of next week. She is asking if you could add her on before then.

## 2011-01-15 NOTE — Telephone Encounter (Signed)
Patient's daughter notified. Appt scheduled for tomorrow.

## 2011-01-15 NOTE — Telephone Encounter (Signed)
Okay to add her on tomorrow afternoon Please ignore my message to check on her

## 2011-01-16 ENCOUNTER — Ambulatory Visit (INDEPENDENT_AMBULATORY_CARE_PROVIDER_SITE_OTHER): Payer: Medicare Other | Admitting: Internal Medicine

## 2011-01-16 ENCOUNTER — Encounter: Payer: Self-pay | Admitting: Internal Medicine

## 2011-01-16 DIAGNOSIS — J189 Pneumonia, unspecified organism: Secondary | ICD-10-CM

## 2011-01-16 DIAGNOSIS — F329 Major depressive disorder, single episode, unspecified: Secondary | ICD-10-CM

## 2011-01-16 DIAGNOSIS — I5021 Acute systolic (congestive) heart failure: Secondary | ICD-10-CM | POA: Insufficient documentation

## 2011-01-16 MED ORDER — FLUOXETINE HCL 20 MG PO CAPS: 20.0000 mg | ORAL_CAPSULE | Freq: Every day | ORAL | Status: DC

## 2011-01-16 NOTE — Progress Notes (Signed)
Subjective:    Patient ID: Haley Harris, female    DOB: 24-Dec-1928, 77 y.o.   MRN: [id number]  HPI Started feeling sick 7/2 evening---- cough Went to ER next day Diagnosed with pneumonia. CXR reviewed Given IV antibiotic and oral to contine No problems with antibiotic  Cough is better but persists Now with green and yellow mucus DOE with any exertion Using the albuterol bid   No fever since the first day Has taken tylenol today No chills, sweats or shakes in last 2 nights  Depression is worse Crying easily  Has worsened off the fluoxetine  Current Outpatient Prescriptions on File Prior to Visit  Medication Sig Dispense Refill  . aspirin 81 MG tablet Take 81 mg by mouth daily.        . bisoprolol-hydrochlorothiazide (ZIAC) 5-6.25 MG per tablet Take 1 tablet by mouth daily.        . Calcium Carbonate-Vitamin D (CALTRATE 600+D PO) Take 1 tablet by mouth daily.        . cetirizine (ZYRTEC) 10 MG tablet Take 10 mg by mouth 2 (two) times daily as needed.        . clonazePAM (KLONOPIN) 0.5 MG tablet Take 0.5 mg by mouth 2 (two) times daily as needed.        . fexofenadine (ALLEGRA) 180 MG tablet Take 180 mg by mouth daily as needed. For allergies       . fluticasone (FLONASE) 50 MCG/ACT nasal spray 2 sprays by Nasal route daily.        Marland Kitchen HYDROcodone-acetaminophen (VICODIN) 5-500 MG per tablet Take 1 tablet by mouth 2 (two) times daily as needed.        . montelukast (SINGULAIR) 10 MG tablet Take 10 mg by mouth daily as needed. For severe resistant allergy symptoms        . Multiple Vitamin (MULTIVITAMIN) tablet Take 1 tablet by mouth daily.        . naproxen sodium (ANAPROX) 220 MG tablet Take 220-440 mg by mouth 2 (two) times daily as needed. For arthritis pain        . omeprazole (PRILOSEC) 20 MG capsule Take 20 mg by mouth 2 (two) times daily.        . pravastatin (PRAVACHOL) 80 MG tablet Take 80 mg by mouth at bedtime.        . triamterene-hydrochlorothiazide (MAXZIDE-25)  37.5-25 MG per tablet Take 1 tablet by mouth daily.        . vitamin B-12 (CYANOCOBALAMIN) 1000 MCG tablet Take 1,000 mcg by mouth 3 (three) times a week.        Marland Kitchen DISCONTD: albuterol (VENTOLIN HFA) 108 (90 BASE) MCG/ACT inhaler Inhale 2 puffs into the lungs every 6 (six) hours as needed for wheezing or shortness of breath. Regularly for next few days then as needed  1 Inhaler  1  . DISCONTD: guaiFENesin-codeine (ROBITUSSIN AC) 100-10 MG/5ML syrup Take 5 mLs by mouth 2 (two) times daily as needed for cough. Sedation precautions  120 mL  0    Allergies  Allergen Reactions  . Augmentin Anaphylaxis  . Ace Inhibitors   . Amoxicillin     REACTION: Angioedema requiring intubation  . Cephalexin   . Clarithromycin   . Nifedipine   . Olmesartan Medoxomil     REACTION: cough  . Tramadol Hcl     Past Medical History  Diagnosis Date  . Allergic rhinitis, cause unspecified   . Unspecified cardiovascular disease   . Personal history  of malignant neoplasm of breast   . Occlusion and stenosis of carotid artery without mention of cerebral infarction   . Chronic airway obstruction, not elsewhere classified   . Depressive disorder, not elsewhere classified   . Other dyspnea and respiratory abnormality   . Esophageal reflux   . Unspecified hearing loss   . Other and unspecified hyperlipidemia   . Unspecified essential hypertension   . Malignant neoplasm of breast (female), unspecified site   . Osteoarthrosis, unspecified whether generalized or localized, unspecified site   . Osteoporosis, unspecified   . Peripheral vascular disease, unspecified   . Unspecified urinary incontinence   . Spinal stenosis   . ACE-inhibitor cough     Past Surgical History  Procedure Date  . Mastectomy 1987    Right  . Breast reconstruction 1998    Reconstruction   . Reduction mammaplasty 1998    Left  . Mastoidectomy childhood  . Appendectomy 1948  . Lumbar laminectomy 2003  . Shoulder surgery 02/2005     Bilateral fractures with multiple surgeries  . Breast implant removal 06/12/09    right    Family History  Problem Relation Age of Onset  . Heart attack Father 9  . Cancer Mother     ovarian  . Cancer Brother     lung  . Arthritis      family    History   Social History  . Marital Status: Widowed    Spouse Name: N/A    Number of Children: 2  . Years of Education: N/A   Occupational History  . Instructor with [employer]    Social History Main Topics  . Smoking status: Former Smoker    Quit date: 07/13/1974  . Smokeless tobacco: Not on file  . Alcohol Use: Yes  . Drug Use: Not on file  . Sexually Active: Not on file   Other Topics Concern  . Not on file   Social History Narrative  . No narrative on file   Review of Systems Has had headaches Appetite is off but eating    Objective:   Physical Exam  Constitutional: She appears well-developed and well-nourished. No distress.  HENT:  Mouth/Throat: Oropharynx is clear and moist. No oropharyngeal exudate.  Neck: Normal range of motion. No thyromegaly present.  Cardiovascular: Normal rate, regular rhythm and normal heart sounds.  Exam reveals no gallop.   No murmur heard. Pulmonary/Chest: Effort normal. No respiratory distress. She has no wheezes. She has no rales.       Muffled breath sounds with some rhonchi on the left  Musculoskeletal: She exhibits no edema and no tenderness.  Lymphadenopathy:    She has no cervical adenopathy.  Psychiatric: Her behavior is normal. Judgment and thought content normal.       Neutral mood with appropriate affect          Assessment & Plan:

## 2011-01-16 NOTE — Assessment & Plan Note (Signed)
Has clearly worsened off the fluoxetine  Will restart and see her in 1 month

## 2011-01-16 NOTE — Assessment & Plan Note (Signed)
Left sided pneumonia on CXR Clinical improvement on the antibiotic Suggested probiotic Will check CXR at follow up in 1 month

## 2011-02-06 ENCOUNTER — Encounter: Payer: Self-pay | Admitting: Family Medicine

## 2011-02-06 ENCOUNTER — Ambulatory Visit (INDEPENDENT_AMBULATORY_CARE_PROVIDER_SITE_OTHER): Payer: Medicare Other | Admitting: Family Medicine

## 2011-02-06 DIAGNOSIS — J069 Acute upper respiratory infection, unspecified: Secondary | ICD-10-CM

## 2011-02-06 NOTE — Assessment & Plan Note (Addendum)
Lungs CTAB.  Likely viral, okay for outpatient f/u.  D/w pt.  Call back as needed, then f/u with PMD as scheduled. She agrees.

## 2011-02-06 NOTE — Patient Instructions (Signed)
Drink plenty of fluids, take tylenol as needed, and gargle with warm salt water for your throat.  Take delsym for cough.  This should gradually improve.  Take care.  Let us know if you have other concerns.

## 2011-02-06 NOTE — Progress Notes (Signed)
At ER 7/12 with PNA, now s/p avelox.  On delsym for cough.  Still fatigued but was getting better from PNA- she had less cough/sputum and sleeping better.  Breathing was getting better.  Now with newer sx this week- less energy, rhinorrhea, but no fevers.  Now with tickle in throat-->cough.  No sputum now.  No aches.  No ear pain.  She's had some watering of the eyes.    Meds, vitals, and allergies reviewed.   ROS: See HPI.  Otherwise, noncontributory.  GEN: nad, alert and oriented, nontoxic appearing HEENT: mucous membranes moist, tm w/o erythema, nasal exam w/o erythema, clear discharge noted,  OP with cobblestoning NECK: supple w/o LA CV: rrr.   PULM: ctab, no inc wob, no wheeze Normal cap refill

## 2011-02-13 ENCOUNTER — Other Ambulatory Visit: Payer: Self-pay | Admitting: Internal Medicine

## 2011-02-13 DIAGNOSIS — J189 Pneumonia, unspecified organism: Secondary | ICD-10-CM

## 2011-02-17 ENCOUNTER — Ambulatory Visit: Payer: Medicare Other | Admitting: Internal Medicine

## 2011-02-25 ENCOUNTER — Encounter: Payer: Self-pay | Admitting: Internal Medicine

## 2011-02-25 ENCOUNTER — Ambulatory Visit (INDEPENDENT_AMBULATORY_CARE_PROVIDER_SITE_OTHER)
Admission: RE | Admit: 2011-02-25 | Discharge: 2011-02-25 | Disposition: A | Discharge status: Home / Self Care | Payer: Medicare Other | Source: Ambulatory Visit | Attending: Internal Medicine | Admitting: Internal Medicine

## 2011-02-25 ENCOUNTER — Ambulatory Visit (INDEPENDENT_AMBULATORY_CARE_PROVIDER_SITE_OTHER): Payer: Medicare Other | Admitting: Internal Medicine

## 2011-02-25 DIAGNOSIS — F329 Major depressive disorder, single episode, unspecified: Secondary | ICD-10-CM

## 2011-02-25 DIAGNOSIS — J189 Pneumonia, unspecified organism: Secondary | ICD-10-CM

## 2011-02-25 DIAGNOSIS — C50919 Malignant neoplasm of unspecified site of unspecified female breast: Secondary | ICD-10-CM

## 2011-02-25 DIAGNOSIS — Z853 Personal history of malignant neoplasm of breast: Secondary | ICD-10-CM

## 2011-02-25 MED ORDER — PRAVASTATIN SODIUM 80 MG PO TABS: 80.0000 mg | ORAL_TABLET | Freq: Every day | ORAL | Status: DC

## 2011-02-25 MED ORDER — FLUOXETINE HCL 20 MG PO CAPS: 20.0000 mg | ORAL_CAPSULE | Freq: Every day | ORAL | Status: DC

## 2011-02-25 MED ORDER — OMEPRAZOLE 20 MG PO CPDR: 20.0000 mg | DELAYED_RELEASE_CAPSULE | Freq: Two times a day (BID) | ORAL | Status: DC

## 2011-02-25 NOTE — Assessment & Plan Note (Signed)
Better now Has helped that she retired fully now Discussed alternate ways to stay fulfilled Continue the med

## 2011-02-25 NOTE — Assessment & Plan Note (Signed)
symptoms better CXR looks better--will await radiologist's reading

## 2011-02-25 NOTE — Progress Notes (Signed)
Subjective:    Patient ID: Haley Harris, female    DOB: Jul 25, 1928, 77 y.o.   MRN: [id number]  HPI Did get over the cold Still feels weak and not as much energy but feels the pneumonia has cleared Cough has resolved No fever Breathing is okay Feels the heat may be the factor  Feels better with mood Quit job and that is helping her a lot---couldn't fire her but wanted her gone Depression has lifted  Current Outpatient Prescriptions on File Prior to Visit  Medication Sig Dispense Refill  . albuterol (PROVENTIL HFA;VENTOLIN HFA) 108 (90 BASE) MCG/ACT inhaler Inhale 2 puffs into the lungs every 6 (six) hours as needed.        Marland Kitchen aspirin 81 MG tablet Take 81 mg by mouth daily.        . bisoprolol-hydrochlorothiazide (ZIAC) 5-6.25 MG per tablet Take 1 tablet by mouth daily.        . Calcium Carbonate-Vitamin D (CALTRATE 600+D PO) Take 1 tablet by mouth daily.        . cetirizine (ZYRTEC) 10 MG tablet Take 10 mg by mouth 2 (two) times daily as needed.        . clonazePAM (KLONOPIN) 0.5 MG tablet Take 0.5 mg by mouth 2 (two) times daily as needed.        Marland Kitchen HYDROcodone-acetaminophen (VICODIN) 5-500 MG per tablet Take 1 tablet by mouth 2 (two) times daily as needed.        . montelukast (SINGULAIR) 10 MG tablet Take 10 mg by mouth daily as needed. For severe resistant allergy symptoms        . Multiple Vitamin (MULTIVITAMIN) tablet Take 1 tablet by mouth daily.        . naproxen sodium (ANAPROX) 220 MG tablet Take 220-440 mg by mouth 2 (two) times daily as needed. For arthritis pain        . triamterene-hydrochlorothiazide (MAXZIDE-25) 37.5-25 MG per tablet Take 1 tablet by mouth daily.        . vitamin B-12 (CYANOCOBALAMIN) 1000 MCG tablet Take 1,000 mcg by mouth 3 (three) times a week.          Allergies  Allergen Reactions  . Augmentin Anaphylaxis  . Ace Inhibitors   . Amoxicillin     REACTION: Angioedema requiring intubation  . Cephalexin   . Clarithromycin   . Nifedipine   .  Olmesartan Medoxomil     REACTION: cough  . Tramadol Hcl     Past Medical History  Diagnosis Date  . Allergic rhinitis, cause unspecified   . Unspecified cardiovascular disease   . Personal history of malignant neoplasm of breast   . Occlusion and stenosis of carotid artery without mention of cerebral infarction   . Chronic airway obstruction, not elsewhere classified   . Depressive disorder, not elsewhere classified   . Other dyspnea and respiratory abnormality   . Esophageal reflux   . Unspecified hearing loss   . Other and unspecified hyperlipidemia   . Unspecified essential hypertension   . Malignant neoplasm of breast (female), unspecified site   . Osteoarthrosis, unspecified whether generalized or localized, unspecified site   . Osteoporosis, unspecified   . Peripheral vascular disease, unspecified   . Unspecified urinary incontinence   . Spinal stenosis   . ACE-inhibitor cough     Past Surgical History  Procedure Date  . Mastectomy 1987    Right  . Breast reconstruction 1998    Reconstruction   . Reduction mammaplasty  1998    Left  . Mastoidectomy childhood  . Appendectomy 1948  . Lumbar laminectomy 2003  . Shoulder surgery 02/2005    Bilateral fractures with multiple surgeries  . Breast implant removal 06/12/09    right    Family History  Problem Relation Age of Onset  . Heart attack Father 40  . Cancer Mother     ovarian  . Cancer Brother     lung  . Arthritis      family    History   Social History  . Marital Status: Widowed    Spouse Name: N/A    Number of Children: 2  . Years of Education: N/A   Occupational History  . Instructor with [employer]    Social History Main Topics  . Smoking status: Former Smoker    Quit date: 07/13/1974  . Smokeless tobacco: Never Used  . Alcohol Use: Yes  . Drug Use: Not on file  . Sexually Active: Not on file   Other Topics Concern  . Not on file   Social History Narrative  . No narrative on  file      Review of Systems Appetite is fine Sleeping okay most of the time---occ wakes at 3-4AM and can't go back to sleep    Objective:   Physical Exam  Constitutional: She appears well-developed and well-nourished. No distress.  Psychiatric: She has a normal mood and affect. Her behavior is normal. Judgment and thought content normal.          Assessment & Plan:

## 2011-02-26 ENCOUNTER — Telehealth: Payer: Self-pay | Admitting: *Deleted

## 2011-02-26 NOTE — Telephone Encounter (Signed)
.  left message to have patient return my call.  

## 2011-02-26 NOTE — Telephone Encounter (Signed)
Message copied by Sueanne Margarita on Thu Feb 26, 2011 11:34 AM ------      Message from: Tillman Abide I      Created: Wed Feb 25, 2011  9:05 PM       Please call      The radiologist agreed that the pneumonia has cleared      Very reassuring

## 2011-02-27 NOTE — Telephone Encounter (Signed)
Spoke with patient and advised results   

## 2011-03-20 ENCOUNTER — Ambulatory Visit: Payer: Self-pay | Admitting: Internal Medicine

## 2011-04-08 LAB — BASIC METABOLIC PANEL
Chloride: 102
GFR calc Af Amer: >60
Potassium: 4
Sodium: 139

## 2011-04-08 LAB — POCT HEMOGLOBIN-HEMACUE: Hemoglobin: 14

## 2011-04-09 ENCOUNTER — Ambulatory Visit: Payer: Self-pay | Admitting: Internal Medicine

## 2011-04-09 ENCOUNTER — Encounter: Payer: Self-pay | Admitting: Internal Medicine

## 2011-04-13 ENCOUNTER — Encounter: Payer: Self-pay | Admitting: *Deleted

## 2011-05-01 ENCOUNTER — Other Ambulatory Visit: Payer: Self-pay | Admitting: *Deleted

## 2011-05-01 MED ORDER — FLUTICASONE PROPIONATE 50 MCG/ACT NA SUSP: 2.0000 | Freq: Every day | NASAL | Status: DC

## 2011-05-01 NOTE — Telephone Encounter (Signed)
Ok to refill? Not on current med list (I added it)

## 2011-05-01 NOTE — Telephone Encounter (Signed)
Yes, I think she uses it periodically Can Rx for 1 year

## 2011-05-12 ENCOUNTER — Ambulatory Visit (INDEPENDENT_AMBULATORY_CARE_PROVIDER_SITE_OTHER): Payer: Medicare Other | Admitting: Internal Medicine

## 2011-05-12 ENCOUNTER — Encounter: Payer: Self-pay | Admitting: Internal Medicine

## 2011-05-12 DIAGNOSIS — M1990 Unspecified osteoarthritis, unspecified site: Secondary | ICD-10-CM

## 2011-05-12 DIAGNOSIS — I10 Essential (primary) hypertension: Secondary | ICD-10-CM

## 2011-05-12 DIAGNOSIS — F329 Major depressive disorder, single episode, unspecified: Secondary | ICD-10-CM

## 2011-05-12 MED ORDER — ALBUTEROL SULFATE HFA 108 (90 BASE) MCG/ACT IN AERS: 2.0000 | INHALATION_SPRAY | Freq: Four times a day (QID) | RESPIRATORY_TRACT | Status: DC | PRN

## 2011-05-12 NOTE — Assessment & Plan Note (Signed)
BP Readings from Last 3 Encounters:  05/12/11 160/80  02/25/11 150/80  02/06/11 150/80   Fair control Low at times at home so will not increase the dose

## 2011-05-12 NOTE — Assessment & Plan Note (Signed)
Ongoing symptoms The antsy feeling may be worse with increased fluoxetine--though she doesn't really note this She is adjusting to the change in her life---looking at her age, etc more Will set up counselling See back in 2 months--if not better, will add venlafaxine or bupropion as augmentation therapy Cut back on fluoxetine to 20mg  daily

## 2011-05-12 NOTE — Assessment & Plan Note (Signed)
Bad in knees May need to progress to TKR Not ready for this--esp with mood issues -- but it does limit (like needed help to get up on table)

## 2011-05-12 NOTE — Progress Notes (Signed)
Subjective:    Patient ID: Haley Harris, female    DOB: January 09, 1929, 77 y.o.   MRN: [id number]  HPI She has "this trembly feeling all the time" Has had some apathy---making excuses and not doing anything "It seems easy to just stay home and not do anything" Enjoys grandchildren but otherwise anhedonic No suicidal thoughts---has thought about dying though May still be adjusting to not working---is concerned that she may run out of money Appetite is okay--trying to be careful to reduce the weight she gained after retiring (without success) Sleep is either too much or not at all---awakens and can't get back to sleep Increased her fluoxetine 2 weeks ago---now taking 40mg  daily  Has had some periods of feeling more hot than usual---gets SOB with this Everything is "more than I want to think about"  Checking BP All over the place-- "it is crazy" 112/81 first thing in AM By afternoon it may be 148-152/99 Has needed the albuterol more due to SOB  Current Outpatient Prescriptions on File Prior to Visit  Medication Sig Dispense Refill  . albuterol (PROVENTIL HFA;VENTOLIN HFA) 108 (90 BASE) MCG/ACT inhaler Inhale 2 puffs into the lungs every 6 (six) hours as needed.        Marland Kitchen aspirin 81 MG tablet Take 81 mg by mouth daily.        . bisoprolol-hydrochlorothiazide (ZIAC) 5-6.25 MG per tablet Take 1 tablet by mouth daily.        . Calcium Carbonate-Vitamin D (CALTRATE 600+D PO) Take 1 tablet by mouth daily.        . cetirizine (ZYRTEC) 10 MG tablet Take 10 mg by mouth 2 (two) times daily as needed.        . clonazePAM (KLONOPIN) 0.5 MG tablet Take 0.5 mg by mouth 2 (two) times daily as needed.        Marland Kitchen FLUoxetine (PROZAC) 20 MG capsule Take 1 capsule (20 mg total) by mouth daily.  90 capsule  3  . fluticasone (FLONASE) 50 MCG/ACT nasal spray Place 2 sprays into the nose daily.  16 g  11  . HYDROcodone-acetaminophen (VICODIN) 5-500 MG per tablet Take 1 tablet by mouth 2 (two) times daily as needed.         . montelukast (SINGULAIR) 10 MG tablet Take 10 mg by mouth daily as needed. For severe resistant allergy symptoms        . Multiple Vitamin (MULTIVITAMIN) tablet Take 1 tablet by mouth daily.        . naproxen sodium (ANAPROX) 220 MG tablet Take 220-440 mg by mouth 2 (two) times daily as needed. For arthritis pain        . omeprazole (PRILOSEC) 20 MG capsule Take 1 capsule (20 mg total) by mouth 2 (two) times daily.  90 capsule  3  . pravastatin (PRAVACHOL) 80 MG tablet Take 1 tablet (80 mg total) by mouth at bedtime.  90 tablet  3  . triamterene-hydrochlorothiazide (MAXZIDE-25) 37.5-25 MG per tablet Take 1 tablet by mouth daily.        . vitamin B-12 (CYANOCOBALAMIN) 1000 MCG tablet Take 1,000 mcg by mouth 3 (three) times a week.          Allergies  Allergen Reactions  . Augmentin Anaphylaxis  . Ace Inhibitors   . Amoxicillin     REACTION: Angioedema requiring intubation  . Cephalexin   . Clarithromycin   . Nifedipine   . Olmesartan Medoxomil     REACTION: cough  . Tramadol  Hcl     Past Medical History  Diagnosis Date  . Allergic rhinitis, cause unspecified   . Unspecified cardiovascular disease   . Personal history of malignant neoplasm of breast   . Occlusion and stenosis of carotid artery without mention of cerebral infarction   . Chronic airway obstruction, not elsewhere classified   . Depressive disorder, not elsewhere classified   . Other dyspnea and respiratory abnormality   . Esophageal reflux   . Unspecified hearing loss   . Other and unspecified hyperlipidemia   . Unspecified essential hypertension   . Malignant neoplasm of breast (female), unspecified site   . Osteoarthrosis, unspecified whether generalized or localized, unspecified site   . Osteoporosis, unspecified   . Peripheral vascular disease, unspecified   . Unspecified urinary incontinence   . Spinal stenosis   . ACE-inhibitor cough     Past Surgical History  Procedure Date  . Mastectomy  1987    Right  . Breast reconstruction 1998    Reconstruction   . Reduction mammaplasty 1998    Left  . Mastoidectomy childhood  . Appendectomy 1948  . Lumbar laminectomy 2003  . Shoulder surgery 02/2005    Bilateral fractures with multiple surgeries  . Breast implant removal 06/12/09    right    Family History  Problem Relation Age of Onset  . Heart attack Father 39  . Cancer Mother     ovarian  . Cancer Brother     lung  . Arthritis      family    History   Social History  . Marital Status: Widowed    Spouse Name: N/A    Number of Children: 2  . Years of Education: N/A   Occupational History  . 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     Social History Main Topics  . Smoking status: Former Smoker    Quit date: 07/13/1974  . Smokeless tobacco: Never Used  . Alcohol Use: Yes  . Drug Use: Not on file  . Sexually Active: Not on file   Other Topics Concern  . Not on file   Social History Narrative  . No narrative on file   Review of Systems Having trouble with knee---degenerative arthritis Had cortisone shot and TKR was recommended (Dr  Otelia Sergeant)    Objective:   Physical Exam  Constitutional: She appears well-developed and well-nourished. No distress.  Neck: Normal range of motion. Neck supple. No thyromegaly present.  Cardiovascular: Normal rate, regular rhythm and normal heart sounds.  Exam reveals no gallop.   No murmur heard. Pulmonary/Chest: Effort normal and breath sounds normal. No respiratory distress. She has no wheezes. She has no rales.  Musculoskeletal:       Knees are thickened without obvious effusions  Lymphadenopathy:    She has no cervical adenopathy.  Psychiatric:       Dysthymic ?mild anxiety Appropriate interaction and insight          Assessment & Plan:

## 2011-05-14 HISTORY — PX: CORONARY ANGIOPLASTY: SHX604

## 2011-05-27 ENCOUNTER — Ambulatory Visit (INDEPENDENT_AMBULATORY_CARE_PROVIDER_SITE_OTHER): Payer: 59 | Admitting: Psychology

## 2011-05-27 DIAGNOSIS — F331 Major depressive disorder, recurrent, moderate: Secondary | ICD-10-CM

## 2011-05-27 DIAGNOSIS — F411 Generalized anxiety disorder: Secondary | ICD-10-CM

## 2011-05-31 ENCOUNTER — Emergency Department (INDEPENDENT_AMBULATORY_CARE_PROVIDER_SITE_OTHER)
Admission: EM | Admit: 2011-05-31 | Discharge: 2011-05-31 | Disposition: A | Discharge status: Home / Self Care | Payer: Medicare Other | Source: Home / Self Care | Attending: Emergency Medicine | Admitting: Emergency Medicine

## 2011-05-31 ENCOUNTER — Encounter (HOSPITAL_COMMUNITY): Payer: Self-pay

## 2011-05-31 ENCOUNTER — Emergency Department (INDEPENDENT_AMBULATORY_CARE_PROVIDER_SITE_OTHER): Payer: 59

## 2011-05-31 DIAGNOSIS — M25559 Pain in unspecified hip: Secondary | ICD-10-CM

## 2011-05-31 DIAGNOSIS — M25552 Pain in left hip: Secondary | ICD-10-CM

## 2011-05-31 MED ORDER — OXYCODONE-ACETAMINOPHEN 5-325 MG PO TABS: 2.0000 | ORAL_TABLET | ORAL | Status: DC | PRN

## 2011-05-31 MED ORDER — HYDROCODONE-ACETAMINOPHEN 5-325 MG PO TABS: 2.0000 | ORAL_TABLET | Freq: Once | ORAL | Status: DC

## 2011-05-31 MED ORDER — HYDROCODONE-ACETAMINOPHEN 5-325 MG PO TABS
ORAL_TABLET | ORAL | Status: AC
  Filled 2011-05-31: qty 2

## 2011-05-31 NOTE — ED Notes (Signed)
Pt has had lt knee pain and seeing orthopedic MD since August, woke this am with severe lt shoulder, knee and hip pain, states she went stooped down to pick up object and was unable to get up for 45 minutes.

## 2011-05-31 NOTE — ED Provider Notes (Signed)
History     CSN: [id number] Arrival date & time: 05/31/2011  1:11 PM   First MD Initiated Contact with Patient 05/31/11 1113      Chief Complaint  Patient presents with  . Knee Pain    pt has lt sided pain    (Consider location/radiation/quality/duration/timing/severity/associated sxs/prior treatment) HPI Comments: Pt with h/o breast CA, osteoporosis reports dull achy constant left hip pain starting yesterday. Similar pain in left neck, shoulder this am. No parasthesias in arm, leg. States she was on the floor on her knees approx 3 days ago and "struggled" to get up for about 45 minutes before calling for help- was unable to get up back into the chair secondary to knee pain. Denies extremity weakness causing inability to get up from floor. Denies fall. No fevers,known redness or bruising, swelling. No back pain. Pt also states has started riding stationary bike everyday for past week which is new for her. Hip pain worse with movement, standing. Tried vicodin and naprosyn rx'd to her by Dr. Otelia Sergeant with minimal relief.   Patient is a 77 y.o. female presenting with hip pain. The history is provided by the patient.  Hip Pain This is a new problem. The current episode started yesterday. The problem has been gradually worsening. Associated symptoms include abdominal pain. Pertinent negatives include no chest pain. The symptoms are aggravated by walking. The symptoms are relieved by nothing. Treatments tried: vicodin, naprosyn.    Past Medical History  Diagnosis Date  . Allergic rhinitis, cause unspecified   . Unspecified cardiovascular disease   . Personal history of malignant neoplasm of breast   . Occlusion and stenosis of carotid artery without mention of cerebral infarction   . Chronic airway obstruction, not elsewhere classified   . Depressive disorder, not elsewhere classified   . Snoring disorder   . Esophageal reflux   . Unspecified hearing loss   . Other and unspecified  hyperlipidemia   . Unspecified essential hypertension   . Malignant neoplasm of breast (female), unspecified site   . Osteoarthrosis, unspecified whether generalized or localized, unspecified site   . Osteoporosis, unspecified   . Peripheral vascular disease, unspecified   . Unspecified urinary incontinence   . Spinal stenosis   . ACE-inhibitor cough     Past Surgical History  Procedure Date  . Mastectomy 1987    Right  . Breast reconstruction 1998    Reconstruction   . Reduction mammaplasty 1998    Left  . Mastoidectomy childhood  . Appendectomy 1948  . Lumbar laminectomy 2003  . Shoulder surgery 02/2005    Bilateral fractures with multiple surgeries  . Breast implant removal 06/12/09    right  . Abdominal hysterectomy     Family History  Problem Relation Age of Onset  . Heart attack Father 97  . Cancer Mother     ovarian  . Cancer Brother     lung  . Arthritis      family    History  Substance Use Topics  . Smoking status: Former Smoker    Quit date: 07/13/1974  . Smokeless tobacco: Never Used  . Alcohol Use: Yes    OB History    Grav Para Term Preterm Abortions TAB SAB Ect Mult Living                  Review of Systems  Constitutional: Negative for fever.  Cardiovascular: Negative for chest pain.  Gastrointestinal: Positive for abdominal pain.  Musculoskeletal: Positive for arthralgias.  Negative for back pain, joint swelling and gait problem.  Skin: Negative for rash.  Neurological: Negative for weakness.  Hematological: Bruises/bleeds easily.    Allergies  Augmentin; Ace inhibitors; Amoxicillin; Cephalexin; Clarithromycin; Nifedipine; Olmesartan medoxomil; and Tramadol hcl  Home Medications   Current Outpatient Rx  Name Route Sig Dispense Refill  . ALBUTEROL SULFATE HFA 108 (90 BASE) MCG/ACT IN AERS Inhalation Inhale 2 puffs into the lungs every 6 (six) hours as needed for wheezing. 1 Inhaler 1  . ASPIRIN 81 MG PO TABS Oral Take 81 mg by mouth  daily.      Marland Kitchen BISOPROLOL-HYDROCHLOROTHIAZIDE 5-6.25 MG PO TABS Oral Take 1 tablet by mouth daily.      Marland Kitchen CALTRATE 600+D PO Oral Take 1 tablet by mouth daily.      Marland Kitchen CETIRIZINE HCL 10 MG PO TABS Oral Take 10 mg by mouth 2 (two) times daily as needed.      Marland Kitchen CLONAZEPAM 0.5 MG PO TABS Oral Take 0.5 mg by mouth 2 (two) times daily as needed.      Marland Kitchen FLUOXETINE HCL 20 MG PO CAPS Oral Take 1 capsule (20 mg total) by mouth daily. 90 capsule 3  . FLUTICASONE PROPIONATE 50 MCG/ACT NA SUSP Nasal Place 2 sprays into the nose daily. 16 g 11  . HYDROCODONE-ACETAMINOPHEN 5-500 MG PO TABS Oral Take 1 tablet by mouth 2 (two) times daily as needed.      Marland Kitchen MONTELUKAST SODIUM 10 MG PO TABS Oral Take 10 mg by mouth daily as needed. For severe resistant allergy symptoms      . ONE-DAILY MULTI VITAMINS PO TABS Oral Take 1 tablet by mouth daily.      Marland Kitchen NAPROXEN SODIUM 220 MG PO TABS Oral Take 220-440 mg by mouth 2 (two) times daily as needed. For arthritis pain      . OMEPRAZOLE 20 MG PO CPDR Oral Take 1 capsule (20 mg total) by mouth 2 (two) times daily. 90 capsule 3  . PRAVASTATIN SODIUM 80 MG PO TABS Oral Take 1 tablet (80 mg total) by mouth at bedtime. 90 tablet 3  . TRIAMTERENE-HCTZ 37.5-25 MG PO TABS Oral Take 1 tablet by mouth daily.      Marland Kitchen VITAMIN B-12 1000 MCG PO TABS Oral Take 1,000 mcg by mouth 3 (three) times a week.        BP 157/85  Pulse 70  Temp(Src) 98.5 F (36.9 C) (Oral)  Resp 18  SpO2 100%  Physical Exam  Nursing note and vitals reviewed. Constitutional: She is oriented to person, place, and time. She appears well-developed and well-nourished. No distress.  HENT:  Head: Normocephalic and atraumatic.  Eyes: EOM are normal. Pupils are equal, round, and reactive to light.  Neck: Normal range of motion.  Cardiovascular: Regular rhythm.   Pulmonary/Chest: Effort normal and breath sounds normal.  Abdominal: She exhibits no distension.  Musculoskeletal: Normal range of motion.       Small  bruise over gluteal muscles. No erythema, rash. Diffuse muscular tenderness laterally over gluteal muscles. No tenderness down IT band, quadriceps. pain with passive abduction/adduction of leg. pain with int/ext rotation hip. No tenderness at sciatic notch. Roll test for muscle spasm negative.  Flexion/extension knee WNL. L Knee joint mildly swollen, diffuse tenderness but pt states is not new.  Motor strenght flexion/ext hip 5/5. Sensation to LT intact. PT 2+ pain with weightbearing on left leg.  Neurological: She is alert and oriented to person, place, and time.  Skin: Skin  is warm and dry.  Psychiatric: She has a normal mood and affect. Her behavior is normal. Judgment and thought content normal.    ED Course  Procedures (including critical care time)  Labs Reviewed - No data to display No results found. Dg Hip Complete Left  05/31/2011  *RADIOLOGY REPORT*  Clinical Data: Left hip pain, no injury  LEFT HIP - COMPLETE 2+ VIEW  Comparison: None.  Findings: No fracture or dislocation is seen.  The bilateral hip joint spaces are mildly narrowed but symmetric.  Degenerative changes with prior posterior fixation of the lower lumbar spine.  IMPRESSION: No fracture or dislocation is seen.  Original Report Authenticated By: Charline Bills, M.D.   The patient was given the following meds in the [facility]:   Medications  HYDROcodone-acetaminophen (NORCO) 5-325 MG per tablet 2 tablet (not administered)  oxyCODONE-acetaminophen (PERCOCET) 5-325 MG per tablet (not administered)     And had the following response: improved On re-evaluation, pt comfortable, VSS, no complaints.   Discussed imaging, lab results with patient/ family. Emphasized importance of f/u. Pt/family agrees.     No diagnosis found. Suspect strain/sprain, checkin XR as h/o CA/osteoporosis, pain with ROM hip. txing pain. Discussed that she may need more advanced imaging and needs to f/u with Dr. Otelia Sergeant. Pt, family Agrees with plan.    MDM     Luiz Blare, MD 05/31/11 [phone]

## 2011-06-02 ENCOUNTER — Telehealth: Payer: Self-pay | Admitting: *Deleted

## 2011-06-02 NOTE — Telephone Encounter (Signed)
Please check to see how she is doing Set up appt prn

## 2011-06-02 NOTE — Telephone Encounter (Signed)
Spoke with patient and advised results, and she went to her orthopedist and she had an acute flair up of arthritis, she getting some steroids now, states everything is ok.

## 2011-06-02 NOTE — Telephone Encounter (Signed)
[facility] Triage Call Report Triage Record Num: [id number] Operator: Geanie Berlin Patient Name: Haley Harris Call Date & Time: 05/31/2011 9:24:21AM Patient Phone: [phone] PCP: Tillman Abide Patient Gender: Female PCP Fax : [phone] Patient DOB: [birth year] Practice Name: [facility] Reason for Call: [name]: [name]/Patient; PCP: Tillman Abide I.; CB#: [phone]; Call Reason: Full Body Pain; Sx Onset: 05/30/2011; Sx Notes: Genralized joint pain L knee, L hip, L shoulder, bilateral wrists, neck and R knee after grocery shopping 05/30/11, Using Vicodan q 4 hrs and Naprosyn with some relief. Pain worse with movement; Afebrile; ; Home treatment(s) tried: Vicodin; Did home treatment help?: No; Guideline Used: Hip Non Injury Guideline ; Disp: See [facility] now for unbearable pain; Appt Scheduled?: N Protocol(s) Used: Hip Non-Injury Recommended Outcome per Protocol: See ED Immediately Reason for Outcome: Unbearable pain Care Advice: ~ Protect the patient from falling or other harm. ~ Do not give the patient anything to eat or drink. ~ IMMEDIATE ACTION ~ DO NOT attempt to place any weight on the affected extremity until evaluated by provider. Write down provider's name. List or place the following in a bag for transport with the patient: current prescription and/or nonprescription medications; alternative treatments, therapies and medications; and street drugs. ~ ~ Support part in position of comfort to reduce pain and swelling; avoid unnecessary movement. 05/31/2011 9:37:51AM Page 1 of 1 [id number]

## 2011-06-04 ENCOUNTER — Emergency Department (HOSPITAL_COMMUNITY): Payer: Medicare Other

## 2011-06-04 ENCOUNTER — Other Ambulatory Visit: Payer: Self-pay

## 2011-06-04 ENCOUNTER — Inpatient Hospital Stay (HOSPITAL_COMMUNITY)
Admission: EM | Admit: 2011-06-04 | Discharge: 2011-06-10 | DRG: 246 | Disposition: A | Discharge status: Skilled Nursing Facility | Payer: Medicare Other | Attending: Internal Medicine | Admitting: Internal Medicine

## 2011-06-04 ENCOUNTER — Encounter (HOSPITAL_COMMUNITY): Payer: Self-pay | Admitting: Emergency Medicine

## 2011-06-04 DIAGNOSIS — J449 Chronic obstructive pulmonary disease, unspecified: Secondary | ICD-10-CM | POA: Diagnosis present

## 2011-06-04 DIAGNOSIS — F329 Major depressive disorder, single episode, unspecified: Secondary | ICD-10-CM

## 2011-06-04 DIAGNOSIS — M810 Age-related osteoporosis without current pathological fracture: Secondary | ICD-10-CM

## 2011-06-04 DIAGNOSIS — I25119 Atherosclerotic heart disease of native coronary artery with unspecified angina pectoris: Secondary | ICD-10-CM | POA: Diagnosis present

## 2011-06-04 DIAGNOSIS — C50919 Malignant neoplasm of unspecified site of unspecified female breast: Secondary | ICD-10-CM

## 2011-06-04 DIAGNOSIS — E785 Hyperlipidemia, unspecified: Secondary | ICD-10-CM

## 2011-06-04 DIAGNOSIS — F3289 Other specified depressive episodes: Secondary | ICD-10-CM

## 2011-06-04 DIAGNOSIS — R32 Unspecified urinary incontinence: Secondary | ICD-10-CM

## 2011-06-04 DIAGNOSIS — J301 Allergic rhinitis due to pollen: Secondary | ICD-10-CM | POA: Diagnosis present

## 2011-06-04 DIAGNOSIS — J069 Acute upper respiratory infection, unspecified: Secondary | ICD-10-CM

## 2011-06-04 DIAGNOSIS — J309 Allergic rhinitis, unspecified: Secondary | ICD-10-CM

## 2011-06-04 DIAGNOSIS — I6529 Occlusion and stenosis of unspecified carotid artery: Secondary | ICD-10-CM

## 2011-06-04 DIAGNOSIS — I214 Non-ST elevation (NSTEMI) myocardial infarction: Principal | ICD-10-CM

## 2011-06-04 DIAGNOSIS — Z853 Personal history of malignant neoplasm of breast: Secondary | ICD-10-CM

## 2011-06-04 DIAGNOSIS — I5021 Acute systolic (congestive) heart failure: Secondary | ICD-10-CM

## 2011-06-04 DIAGNOSIS — E871 Hypo-osmolality and hyponatremia: Secondary | ICD-10-CM

## 2011-06-04 DIAGNOSIS — I2510 Atherosclerotic heart disease of native coronary artery without angina pectoris: Secondary | ICD-10-CM

## 2011-06-04 DIAGNOSIS — Z87891 Personal history of nicotine dependence: Secondary | ICD-10-CM

## 2011-06-04 DIAGNOSIS — I509 Heart failure, unspecified: Secondary | ICD-10-CM | POA: Diagnosis present

## 2011-06-04 DIAGNOSIS — I739 Peripheral vascular disease, unspecified: Secondary | ICD-10-CM

## 2011-06-04 DIAGNOSIS — Z9010 Acquired absence of unspecified breast and nipple: Secondary | ICD-10-CM

## 2011-06-04 DIAGNOSIS — Z8673 Personal history of transient ischemic attack (TIA), and cerebral infarction without residual deficits: Secondary | ICD-10-CM

## 2011-06-04 DIAGNOSIS — I10 Essential (primary) hypertension: Secondary | ICD-10-CM

## 2011-06-04 DIAGNOSIS — J189 Pneumonia, unspecified organism: Secondary | ICD-10-CM | POA: Diagnosis present

## 2011-06-04 DIAGNOSIS — J441 Chronic obstructive pulmonary disease with (acute) exacerbation: Secondary | ICD-10-CM

## 2011-06-04 DIAGNOSIS — M1990 Unspecified osteoarthritis, unspecified site: Secondary | ICD-10-CM

## 2011-06-04 DIAGNOSIS — J159 Unspecified bacterial pneumonia: Secondary | ICD-10-CM

## 2011-06-04 DIAGNOSIS — H9190 Unspecified hearing loss, unspecified ear: Secondary | ICD-10-CM

## 2011-06-04 DIAGNOSIS — J4489 Other specified chronic obstructive pulmonary disease: Secondary | ICD-10-CM

## 2011-06-04 DIAGNOSIS — K219 Gastro-esophageal reflux disease without esophagitis: Secondary | ICD-10-CM

## 2011-06-04 HISTORY — DX: Shortness of breath: R06.02

## 2011-06-04 HISTORY — DX: Anxiety disorder, unspecified: F41.9

## 2011-06-04 HISTORY — DX: Cardiac murmur, unspecified: R01.1

## 2011-06-04 HISTORY — DX: Myoneural disorder, unspecified: G70.9

## 2011-06-04 HISTORY — DX: Angina pectoris, unspecified: I20.9

## 2011-06-04 HISTORY — DX: Acute upper respiratory infection, unspecified: J06.9

## 2011-06-04 HISTORY — DX: Pneumonia, unspecified organism: J18.9

## 2011-06-04 LAB — DIFFERENTIAL
Basophils Absolute: 0 10*3/uL (ref 0.0–0.1)
Lymphocytes Relative: 5 % — ABNORMAL LOW (ref 12–46)
Monocytes Absolute: 0.6 10*3/uL (ref 0.1–1.0)
Monocytes Relative: 5 % (ref 3–12)
Neutro Abs: 10.4 10*3/uL — ABNORMAL HIGH (ref 1.7–7.7)
Neutrophils Relative %: 90 % — ABNORMAL HIGH (ref 43–77)

## 2011-06-04 LAB — BASIC METABOLIC PANEL
CO2: 24 mEq/L (ref 19–32)
Chloride: 86 mEq/L — ABNORMAL LOW (ref 96–112)
Creatinine, Ser: 0.82 mg/dL (ref 0.50–1.10)
Potassium: 4.2 mEq/L (ref 3.5–5.1)

## 2011-06-04 LAB — POCT I-STAT TROPONIN I: Troponin i, poc: 0.04 ng/mL (ref 0.00–0.08)

## 2011-06-04 LAB — CBC
HCT: 33.2 % — ABNORMAL LOW (ref 36.0–46.0)
Hemoglobin: 11.5 g/dL — ABNORMAL LOW (ref 12.0–15.0)
WBC: 11.6 10*3/uL — ABNORMAL HIGH (ref 4.0–10.5)

## 2011-06-04 LAB — PROTIME-INR: INR: 0.99 (ref 0.00–1.49)

## 2011-06-04 LAB — POCT I-STAT 3, ART BLOOD GAS (G3+)
Bicarbonate: 23.7 mEq/L (ref 20.0–24.0)
O2 Saturation: 99 %
Patient temperature: 98.6
pO2, Arterial: 109 mmHg — ABNORMAL HIGH (ref 80.0–100.0)

## 2011-06-04 LAB — CARDIAC PANEL(CRET KIN+CKTOT+MB+TROPI)
CK, MB: 9.3 ng/mL (ref 0.3–4.0)
Troponin I: 1.39 ng/mL (ref <0.30)

## 2011-06-04 LAB — APTT: aPTT: 34 seconds (ref 24–37)

## 2011-06-04 LAB — PRO B NATRIURETIC PEPTIDE: Pro B Natriuretic peptide (BNP): 4348 pg/mL — ABNORMAL HIGH (ref 0–450)

## 2011-06-04 MED ORDER — SODIUM CHLORIDE 0.9 % IV SOLN: INTRAVENOUS | Status: DC

## 2011-06-04 MED ORDER — ONDANSETRON HCL 4 MG/2ML IJ SOLN
4.0000 mg | Freq: Once | INTRAMUSCULAR | Status: AC
  Administered 2011-06-04: 4 mg via INTRAVENOUS
  Filled 2011-06-04: qty 2

## 2011-06-04 MED ORDER — FENTANYL CITRATE 0.05 MG/ML IJ SOLN
25.0000 ug | Freq: Once | INTRAMUSCULAR | Status: AC
  Administered 2011-06-04: 25 ug via INTRAVENOUS
  Filled 2011-06-04: qty 2

## 2011-06-04 MED ORDER — ONE-DAILY MULTI VITAMINS PO TABS
1.0000 | ORAL_TABLET | Freq: Every day | ORAL | Status: DC
  Administered 2011-06-04: 1 via ORAL
  Filled 2011-06-04 (×2): qty 1

## 2011-06-04 MED ORDER — CALCIUM CARB-CHOLECALCIFEROL 600-800 MG-UNIT PO TABS: 1.0000 | ORAL_TABLET | Freq: Every day | ORAL | Status: DC

## 2011-06-04 MED ORDER — ALBUTEROL SULFATE (5 MG/ML) 0.5% IN NEBU
2.5000 mg | INHALATION_SOLUTION | RESPIRATORY_TRACT | Status: DC | PRN
  Filled 2011-06-04: qty 0.5

## 2011-06-04 MED ORDER — ACETAMINOPHEN 325 MG PO TABS
650.0000 mg | ORAL_TABLET | Freq: Four times a day (QID) | ORAL | Status: DC | PRN
  Administered 2011-06-04: 650 mg via ORAL
  Filled 2011-06-04: qty 2

## 2011-06-04 MED ORDER — IPRATROPIUM BROMIDE 0.02 % IN SOLN
RESPIRATORY_TRACT | Status: AC
  Filled 2011-06-04: qty 2.5

## 2011-06-04 MED ORDER — ALBUTEROL SULFATE (5 MG/ML) 0.5% IN NEBU
5.0000 mg | INHALATION_SOLUTION | Freq: Once | RESPIRATORY_TRACT | Status: AC
  Administered 2011-06-04 (×2): 2.5 mg via RESPIRATORY_TRACT

## 2011-06-04 MED ORDER — MOXIFLOXACIN HCL IN NACL 400 MG/250ML IV SOLN
400.0000 mg | INTRAVENOUS | Status: DC
  Administered 2011-06-05: 400 mg via INTRAVENOUS
  Filled 2011-06-04: qty 250

## 2011-06-04 MED ORDER — CLONAZEPAM 0.5 MG PO TABS
0.5000 mg | ORAL_TABLET | Freq: Two times a day (BID) | ORAL | Status: DC | PRN
  Administered 2011-06-04: 0.5 mg via ORAL
  Filled 2011-06-04: qty 1

## 2011-06-04 MED ORDER — IPRATROPIUM BROMIDE 0.02 % IN SOLN
0.5000 mg | Freq: Four times a day (QID) | RESPIRATORY_TRACT | Status: DC
  Administered 2011-06-05 – 2011-06-10 (×19): 0.5 mg via RESPIRATORY_TRACT
  Filled 2011-06-04 (×24): qty 2.5

## 2011-06-04 MED ORDER — MOXIFLOXACIN HCL IN NACL 400 MG/250ML IV SOLN
400.0000 mg | Freq: Once | INTRAVENOUS | Status: AC
  Administered 2011-06-04: 400 mg via INTRAVENOUS
  Filled 2011-06-04: qty 250

## 2011-06-04 MED ORDER — METHYLPREDNISOLONE SODIUM SUCC 125 MG IJ SOLR
125.0000 mg | Freq: Once | INTRAMUSCULAR | Status: AC
  Administered 2011-06-04: 125 mg via INTRAVENOUS
  Filled 2011-06-04: qty 2

## 2011-06-04 MED ORDER — VITAMIN B-12 1000 MCG PO TABS
1000.0000 ug | ORAL_TABLET | ORAL | Status: DC
  Administered 2011-06-08 – 2011-06-10 (×2): 1000 ug via ORAL
  Filled 2011-06-04 (×4): qty 1

## 2011-06-04 MED ORDER — ALBUTEROL SULFATE (5 MG/ML) 0.5% IN NEBU
2.5000 mg | INHALATION_SOLUTION | Freq: Four times a day (QID) | RESPIRATORY_TRACT | Status: DC
  Administered 2011-06-05 – 2011-06-07 (×6): 2.5 mg via RESPIRATORY_TRACT
  Filled 2011-06-04 (×10): qty 0.5

## 2011-06-04 MED ORDER — ASPIRIN EC 81 MG PO TBEC
81.0000 mg | DELAYED_RELEASE_TABLET | Freq: Every day | ORAL | Status: DC
  Administered 2011-06-04: 81 mg via ORAL
  Filled 2011-06-04 (×2): qty 1

## 2011-06-04 MED ORDER — TRIAMTERENE-HCTZ 37.5-25 MG PO TABS: 1.0000 | ORAL_TABLET | Freq: Every day | ORAL | Status: DC

## 2011-06-04 MED ORDER — CALCIUM CARBONATE-VITAMIN D 500-200 MG-UNIT PO TABS
1.0000 | ORAL_TABLET | Freq: Every day | ORAL | Status: DC
  Administered 2011-06-04 – 2011-06-06 (×2): 1 via ORAL
  Administered 2011-06-07: 11:00:00 via ORAL
  Administered 2011-06-08 – 2011-06-10 (×3): 1 via ORAL
  Filled 2011-06-04 (×7): qty 1

## 2011-06-04 MED ORDER — HEPARIN BOLUS VIA INFUSION
4000.0000 [IU] | Freq: Once | INTRAVENOUS | Status: AC
  Administered 2011-06-04: 4000 [IU] via INTRAVENOUS

## 2011-06-04 MED ORDER — FLUOXETINE HCL 20 MG PO CAPS
20.0000 mg | ORAL_CAPSULE | Freq: Every day | ORAL | Status: DC
  Administered 2011-06-04 – 2011-06-10 (×7): 20 mg via ORAL
  Filled 2011-06-04 (×8): qty 1

## 2011-06-04 MED ORDER — HEPARIN (PORCINE) IN NACL 100-0.45 UNIT/ML-% IJ SOLN
1100.0000 [IU]/h | INTRAMUSCULAR | Status: DC
  Administered 2011-06-04: 950 [IU]/h via INTRAVENOUS
  Administered 2011-06-05: 1100 [IU]/h via INTRAVENOUS
  Filled 2011-06-04 (×2): qty 250

## 2011-06-04 MED ORDER — BISOPROLOL-HYDROCHLOROTHIAZIDE 5-6.25 MG PO TABS: 1.0000 | ORAL_TABLET | Freq: Every day | ORAL | Status: DC

## 2011-06-04 MED ORDER — ALBUTEROL SULFATE (5 MG/ML) 0.5% IN NEBU
INHALATION_SOLUTION | RESPIRATORY_TRACT | Status: AC
  Filled 2011-06-04: qty 1

## 2011-06-04 MED ORDER — FUROSEMIDE 10 MG/ML IJ SOLN
40.0000 mg | Freq: Two times a day (BID) | INTRAMUSCULAR | Status: DC
  Administered 2011-06-04 – 2011-06-05 (×2): 40 mg via INTRAVENOUS
  Filled 2011-06-04 (×4): qty 4

## 2011-06-04 MED ORDER — PANTOPRAZOLE SODIUM 40 MG PO TBEC
40.0000 mg | DELAYED_RELEASE_TABLET | Freq: Every day | ORAL | Status: DC
  Administered 2011-06-04 – 2011-06-10 (×6): 40 mg via ORAL
  Filled 2011-06-04 (×7): qty 1

## 2011-06-04 MED ORDER — BISOPROLOL FUMARATE 5 MG PO TABS
5.0000 mg | ORAL_TABLET | Freq: Every day | ORAL | Status: DC
  Administered 2011-06-04: 5 mg via ORAL
  Filled 2011-06-04 (×2): qty 1

## 2011-06-04 MED ORDER — ALUM & MAG HYDROXIDE-SIMETH 200-200-20 MG/5ML PO SUSP
30.0000 mL | Freq: Four times a day (QID) | ORAL | Status: DC | PRN
  Administered 2011-06-06 – 2011-06-07 (×3): 30 mL via ORAL
  Filled 2011-06-04 (×4): qty 30

## 2011-06-04 MED ORDER — FLUTICASONE PROPIONATE 50 MCG/ACT NA SUSP
2.0000 | Freq: Every day | NASAL | Status: DC
  Administered 2011-06-04 – 2011-06-10 (×6): 2 via NASAL
  Filled 2011-06-04 (×2): qty 16

## 2011-06-04 MED ORDER — ENOXAPARIN SODIUM 40 MG/0.4ML ~~LOC~~ SOLN
40.0000 mg | Freq: Every day | SUBCUTANEOUS | Status: DC
  Administered 2011-06-04: 40 mg via SUBCUTANEOUS
  Filled 2011-06-04: qty 0.4

## 2011-06-04 MED ORDER — HYDROCODONE-ACETAMINOPHEN 10-325 MG PO TABS
1.0000 | ORAL_TABLET | ORAL | Status: DC | PRN
  Administered 2011-06-04 – 2011-06-10 (×29): 1 via ORAL
  Filled 2011-06-04 (×30): qty 1

## 2011-06-04 MED ORDER — ASPIRIN 81 MG PO TABS: 81.0000 mg | ORAL_TABLET | Freq: Every day | ORAL | Status: DC

## 2011-06-04 MED ORDER — FUROSEMIDE 10 MG/ML IJ SOLN: 40.0000 mg | Freq: Two times a day (BID) | INTRAMUSCULAR | Status: DC

## 2011-06-04 MED ORDER — SIMVASTATIN 40 MG PO TABS
40.0000 mg | ORAL_TABLET | Freq: Every day | ORAL | Status: DC
  Administered 2011-06-04 – 2011-06-09 (×8): 40 mg via ORAL
  Filled 2011-06-04 (×8): qty 1

## 2011-06-04 MED ORDER — ONDANSETRON HCL 4 MG/2ML IJ SOLN: 4.0000 mg | Freq: Four times a day (QID) | INTRAMUSCULAR | Status: DC | PRN

## 2011-06-04 MED ORDER — OXYCODONE-ACETAMINOPHEN 5-325 MG PO TABS
2.0000 | ORAL_TABLET | ORAL | Status: DC | PRN
  Filled 2011-06-04: qty 2

## 2011-06-04 MED ORDER — NITROGLYCERIN 2 % TD OINT
1.0000 [in_us] | TOPICAL_OINTMENT | Freq: Four times a day (QID) | TRANSDERMAL | Status: DC
  Administered 2011-06-04 – 2011-06-05 (×3): 1 [in_us] via TOPICAL
  Filled 2011-06-04: qty 30

## 2011-06-04 MED ORDER — ALBUTEROL SULFATE (5 MG/ML) 0.5% IN NEBU
INHALATION_SOLUTION | RESPIRATORY_TRACT | Status: AC
  Administered 2011-06-04: 2.5 mg via RESPIRATORY_TRACT
  Filled 2011-06-04: qty 1

## 2011-06-04 MED ORDER — ONDANSETRON HCL 4 MG PO TABS: 4.0000 mg | ORAL_TABLET | Freq: Four times a day (QID) | ORAL | Status: DC | PRN

## 2011-06-04 MED ORDER — ACETAMINOPHEN 650 MG RE SUPP: 650.0000 mg | Freq: Four times a day (QID) | RECTAL | Status: DC | PRN

## 2011-06-04 NOTE — ED Notes (Signed)
Patient in obvious distress and placed on CPAP enroute to ED.

## 2011-06-04 NOTE — ED Notes (Signed)
Patient was given 5mg  albuterol and 0.5mg  atrovent via CPAP enroute to ED.

## 2011-06-04 NOTE — ED Notes (Signed)
Patient woke up from sleep with shortness of breath, unable to talk in full sentences.

## 2011-06-04 NOTE — ED Notes (Signed)
Pt reports having pain in legs, MD is made aware and at bedside for pt re-evaluation and plan of care is updated. Pt will be medicated per [facility], family at bedside, will continue to monitor pt.

## 2011-06-04 NOTE — ED Notes (Signed)
Pt removed from bipap machine by respiratory. Pt tolerating venti-mask @ 40% with sats 100%. Will continue to monitor pt.

## 2011-06-04 NOTE — Progress Notes (Signed)
06/04/11 NSG 1500 Lab called Critical Values of  CKMB 9.3, Trop  1.39.  Dr. Rito Ehrlich text paged, will wait for a response/new orders and continue to monitor.  Forbes Cellar, RN

## 2011-06-04 NOTE — Progress Notes (Signed)
crCRITICAL VALUE ALERT  Critical value received:  CKMB 9.3  Date of notification:  06/04/11  Time of notification:  1458  Critical value read back:yes   Nurse who received alert: Forbes Cellar, RN  MD notified (1st page):Dr. Rito Ehrlich  Time of first page: 1500  MD notified (2nd page): Dr. Rito Ehrlich  Time of second page:1530  Responding MD:  No response  Time MD responded:  1600

## 2011-06-04 NOTE — Progress Notes (Signed)
77 year old female, admitted to 52 from home alone with pneumonia.   A & O x 3, HOH, ambulatory with assist, uses a walker at home.  Plan to return home when medically stable.  IV infusing to right AC, dated for today, O2 2L, skin intact except for old healed scabs and scratch marks all over body from itching.  Pt. high fall risk d/t fall within the past 6 months .  Yellow arm band, red socks placed and sign outside door.  11F foley placed for strict I & O's.  Plan of care to clear infection and maintain safety.  Will continue to monitor and assist with d/c planning as pt. progresses.

## 2011-06-04 NOTE — ED Notes (Signed)
Attempted to call report. RN unable to take report at this time. Number left for RN to return call

## 2011-06-04 NOTE — H&P (Signed)
PCP:   Tillman Abide, MD, MD   Chief Complaint:  Shortness of breath  HPI: Patient is an 77 year old white female past medical history COPD, hypertension and severe osteoporosis who has been in a relatively usual state of health other than flareups of her arthritis for the past few days. At one point she needs to go in to the emergency room for some extra pain control for her arthritis. She otherwise has been doing okay in, when early this morning when going to the bathroom she started feeling somewhat dyspneic. The time she got back to her bed she is feeling very short of breath. There was no associated chest pain. She did note some mild wheezing for she got up and gone to the bathroom. She had noted a mild cough that started today but was pretty much nonproductive.  She came into the emergency room to be evaluated that she's had had a minimally elevated white count of slightly more than 11. She was noted to be afebrile her chest x-ray noted more of an edema pattern but did note some signs of an infiltrate consistent with pneumonia. Patient was given nebulizer treatments, put on oxygen and started on IV Avelox. Chart he states that her breathing is better but not yet back at her baseline.  Review of Systems:  The patient denies any headache, vision changes, chest pain, palpitations. She does note some shortness of breath although better than before I currently no wheezing although she had some earlier. No current cough, no abdominal pain, hematuria, dysuria, constipation, diarrhea, focal steady numbness or weakness. She does complains of generalized weakness. No nausea vomiting. She does complain of arthritis especially in her shoulders knees and her back but this is chronic. Review of systems otherwise negative  Past Medical History: Past Medical History  Diagnosis Date  . Allergic rhinitis, cause unspecified   . Unspecified cardiovascular disease   . Personal history of malignant neoplasm of  breast   . Occlusion and stenosis of carotid artery without mention of cerebral infarction   . Chronic airway obstruction, not elsewhere classified   . Depressive disorder, not elsewhere classified   . Snoring disorder   . Esophageal reflux   . Unspecified hearing loss   . Other and unspecified hyperlipidemia   . Unspecified essential hypertension   . Malignant neoplasm of breast (female), unspecified site   . Osteoarthrosis, unspecified whether generalized or localized, unspecified site   . Osteoporosis, unspecified   . Peripheral vascular disease, unspecified   . Unspecified urinary incontinence   . Spinal stenosis   . ACE-inhibitor cough    Past Surgical History  Procedure Date  . Mastectomy 1987    Right  . Breast reconstruction 1998    Reconstruction   . Reduction mammaplasty 1998    Left  . Mastoidectomy childhood  . Appendectomy 1948  . Lumbar laminectomy 2003  . Shoulder surgery 02/2005    Bilateral fractures with multiple surgeries  . Breast implant removal 06/12/09    right  . Abdominal hysterectomy     Medications: Prior to Admission medications   Medication Sig Start Date End Date Taking? Authorizing Provider  albuterol (PROAIR HFA) 108 (90 BASE) MCG/ACT inhaler Inhale 2 puffs into the lungs every 6 (six) hours as needed for wheezing. 05/12/11 05/11/12 Yes Varney Baas, MD  albuterol (PROVENTIL) (2.5 MG/3ML) 0.083% nebulizer solution Take 2.5 mg by nebulization once.     Yes Historical Provider, MD  aspirin 81 MG tablet Take 81 mg  by mouth daily.     Yes Historical Provider, MD  bisoprolol-hydrochlorothiazide (ZIAC) 5-6.25 MG per tablet Take 1 tablet by mouth daily.     Yes Historical Provider, MD  Calcium Carbonate-Vitamin D (CALTRATE 600+D PO) Take 1 tablet by mouth daily.     Yes Historical Provider, MD  cetirizine (ZYRTEC) 10 MG tablet Take 10 mg by mouth 2 (two) times daily as needed.     Yes Historical Provider, MD  clonazePAM (KLONOPIN) 0.5 MG tablet  Take 0.5 mg by mouth 2 (two) times daily as needed.     Yes Historical Provider, MD  FLUoxetine (PROZAC) 20 MG capsule Take 1 capsule (20 mg total) by mouth daily. 02/25/11 02/25/12 Yes Varney Baas, MD  fluticasone (FLONASE) 50 MCG/ACT nasal spray Place 2 sprays into the nose daily. 05/01/11  Yes Varney Baas, MD  HYDROcodone-acetaminophen (LORTAB) 10-500 MG per tablet Take 1 tablet by mouth every 6 (six) hours as needed. pain    Yes Historical Provider, MD  ipratropium (ATROVENT) 0.02 % nebulizer solution Take 500 mcg by nebulization once.     Yes Historical Provider, MD  montelukast (SINGULAIR) 10 MG tablet Take 10 mg by mouth daily as needed. For severe resistant allergy symptoms     Yes Historical Provider, MD  Multiple Vitamin (MULTIVITAMIN) tablet Take 1 tablet by mouth daily.     Yes Historical Provider, MD  omeprazole (PRILOSEC) 20 MG capsule Take 1 capsule (20 mg total) by mouth 2 (two) times daily. 02/25/11  Yes Varney Baas, MD  oxyCODONE-acetaminophen (PERCOCET) 5-325 MG per tablet Take 2 tablets by mouth every 4 (four) hours as needed for pain. 05/31/11 06/10/11 Yes Luiz Blare, MD  pravastatin (PRAVACHOL) 80 MG tablet Take 1 tablet (80 mg total) by mouth at bedtime. 02/25/11  Yes Varney Baas, MD  triamterene-hydrochlorothiazide (MAXZIDE-25) 37.5-25 MG per tablet Take 1 tablet by mouth daily.     Yes Historical Provider, MD  vitamin B-12 (CYANOCOBALAMIN) 1000 MCG tablet Take 1,000 mcg by mouth 3 (three) times a week.     Yes Historical Provider, MD    Allergies:   Allergies  Allergen Reactions  . Augmentin Anaphylaxis  . Ace Inhibitors   . Amoxicillin     REACTION: Angioedema requiring intubation  . Cephalexin   . Clarithromycin   . Nifedipine   . Olmesartan Medoxomil     REACTION: cough  . Tramadol Hcl     Social History:  reports that she quit smoking about 36 years ago. She has never used smokeless tobacco. She reports that she drinks alcohol. She  reports that she does not use illicit drugs.  Family History: Family History  Problem Relation Age of Onset  . Heart attack Father 24  . Cancer Mother     ovarian  . Cancer Brother     lung  . Arthritis      family    Physical Exam: Filed Vitals:   06/04/11 0500 06/04/11 0527 06/04/11 0605 06/04/11 0833  BP: 124/65  121/81 104/73  Pulse: 98 95 93   Temp:   97.5 F (36.4 C) 98 F (36.7 C)  TempSrc:   Oral Oral  Resp: 15 16 20 18   SpO2: 100% 99% 99% 99%   General appearance: alert, cooperative, appears stated age, fatigued, mild distress and mildly obese Head: Normocephalic, without obvious abnormality, atraumatic Resp: Decreased breath sounds throughout Cardio: regular rate and rhythm, S1, S2 normal, no murmur, click, rub or gallop GI: soft,  non-tender; bowel sounds normal; no masses,  no organomegaly Extremities: extremities normal, atraumatic, no cyanosis or edema   Labs on Admission:   [facility] 06/04/11 0436  NA 123*  K 4.2  CL 86*  CO2 24  GLUCOSE 240*  BUN 30*  CREATININE 0.82  CALCIUM 9.0  MG --  PHOS --     Basename 06/04/11 0436  WBC 11.6*  NEUTROABS 10.4*  HGB 11.5*  HCT 33.2*  MCV 94.6  PLT 247   Radiological Exams on Admission: Dg Hip Complete Left  05/31/2011  .  IMPRESSION: No fracture or dislocation is seen.  Original Report Authenticated By: Charline Bills, M.D.   Dg Chest Portable 1 View  06/04/2011    IMPRESSION: Cardiomegaly, with a pulmonary edema pattern.  Moderate left pleural effusion with associated consolidation; atelectasis versus pneumonia.  Original Report Authenticated By: Waneta Martins, M.D.    Assessment/Plan Present on Admission:   HYPONATREMIA: I suspect the culprit is her thiazide diuretics. I noted that 2 of her blood pressure medicines both have a thiazide component. Will hold this medication gently hydrate and follow.  Marland KitchenHYPERLIPIDEMIA: Currently stable, continue statin  .HYPERTENSION: Currently stable,  will continue her meds without the diuretic part.  Marland KitchenCAROTID ARTERY STENOSIS: Currently stable  .ALLERGIC RHINITIS: Currently stable  .COPD: See below. Avoiding steroids. Patient is actually normally well-controlled. Her baseline is ambulation limited more by arthritis and osteoporosis and not by shortness of breath. She's not on any chronic scheduled inhalers. Upon discharge will have her follow back up with her pulmonologist, Dr. Sherene Sires , who she has not seen years.  Marland KitchenGERD: PPI.  Marland KitchenOSTEOARTHRITIS: Pain control  .OSTEOPOROSIS: Continue pain control and try to avoid steroids.  .Pneumonia, organism unspecified: Principal problem, will treat with nebulizers plus oxygen and antibiotics. I'm not appreciating any wheezing so we'll try to avoid steroids. Noted possible edema on chest x-ray. No history of congestive heart failure. I've ordered a BNP.  Ronold Hardgrove K 06/04/2011, 9:05 AM

## 2011-06-04 NOTE — Progress Notes (Signed)
ANTICOAGULATION CONSULT NOTE - Initial Consult  Pharmacy Consult for heparin Indication: chest pain/ACS  Allergies  Allergen Reactions  . Augmentin Anaphylaxis  . Ace Inhibitors   . Amoxicillin     REACTION: Angioedema requiring intubation  . Cephalexin   . Clarithromycin   . Nifedipine   . Olmesartan Medoxomil     REACTION: cough  . Tramadol Hcl     Patient Measurements: Height: 5\' 6"  (167.6 cm) Weight: 178 lb 2.1 oz (80.8 kg) IBW/kg (Calculated) : 59.3   Vital Signs: Temp: 98.2 F (36.8 C) (11/22 1323) Temp src: Oral (11/22 0833) BP: 144/85 mmHg (11/22 1323) Pulse Rate: 93  (11/22 1323)  Labs:  Basename 06/04/11 1403 06/04/11 0436  HGB -- 11.5*  HCT -- 33.2*  PLT -- 247  APTT -- --  LABPROT -- --  INR -- --  HEPARINUNFRC -- --  CREATININE -- 0.82  CKTOTAL 91 --  CKMB 9.3* --  TROPONINI 1.39* --   Estimated Creatinine Clearance: 58.7 ml/min (by C-G formula based on Cr of 0.82).  Medical History: Past Medical History  Diagnosis Date  . Allergic rhinitis, cause unspecified   . Unspecified cardiovascular disease   . Personal history of malignant neoplasm of breast   . Occlusion and stenosis of carotid artery without mention of cerebral infarction   . Chronic airway obstruction, not elsewhere classified   . Depressive disorder, not elsewhere classified   . Snoring disorder   . Esophageal reflux   . Unspecified hearing loss   . Other and unspecified hyperlipidemia   . Unspecified essential hypertension   . Malignant neoplasm of breast (female), unspecified site   . Osteoarthrosis, unspecified whether generalized or localized, unspecified site   . Osteoporosis, unspecified   . Peripheral vascular disease, unspecified   . Unspecified urinary incontinence   . Spinal stenosis   . ACE-inhibitor cough   . Angina   . Heart murmur     aS CHILD  . Shortness of breath   . Recurrent upper respiratory infection (URI)   . Anxiety   . Pneumonia   .  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 albuterol (PROAIR HFA) 108 (90 BASE) MCG/ACT inhaler Inhale 2 puffs into the lungs every 6 (six) hours as needed for wheezing.  1 Inhaler  1  . albuterol (PROVENTIL) (2.5 MG/3ML) 0.083% nebulizer solution Take 2.5 mg by nebulization once.        Marland Kitchen aspirin 81 MG tablet Take 81 mg by mouth daily.        . bisoprolol-hydrochlorothiazide (ZIAC) 5-6.25 MG per tablet Take 1 tablet by mouth daily.        . Calcium Carbonate-Vitamin D (CALTRATE 600+D PO) Take 1 tablet by mouth daily.        . cetirizine (ZYRTEC) 10 MG tablet Take 10 mg by mouth 2 (two) times daily as needed.        . clonazePAM (KLONOPIN) 0.5 MG tablet Take 0.5 mg by mouth 2 (two) times daily as needed.        Marland Kitchen FLUoxetine (PROZAC) 20 MG capsule Take 1 capsule (20 mg total) by mouth daily.  90 capsule  3  . fluticasone (FLONASE) 50 MCG/ACT nasal spray Place 2 sprays into the nose daily.  16 g  11  . HYDROcodone-acetaminophen (LORTAB) 10-500 MG per tablet Take 1 tablet by mouth every 6 (six) hours as needed.  pain       . ipratropium (ATROVENT) 0.02 % nebulizer solution Take 500 mcg by nebulization once.        . montelukast (SINGULAIR) 10 MG tablet Take 10 mg by mouth daily as needed. For severe resistant allergy symptoms        . Multiple Vitamin (MULTIVITAMIN) tablet Take 1 tablet by mouth daily.        Marland Kitchen omeprazole (PRILOSEC) 20 MG capsule Take 1 capsule (20 mg total) by mouth 2 (two) times daily.  90 capsule  3  . oxyCODONE-acetaminophen (PERCOCET) 5-325 MG per tablet Take 2 tablets by mouth every 4 (four) hours as needed for pain.  15 tablet  0  . pravastatin (PRAVACHOL) 80 MG tablet Take 1 tablet (80 mg total) by mouth at bedtime.  90 tablet  3  . triamterene-hydrochlorothiazide (MAXZIDE-25) 37.5-25 MG per tablet Take 1 tablet by mouth daily.        . vitamin B-12 (CYANOCOBALAMIN) 1000 MCG  tablet Take 1,000 mcg by mouth 3 (three) times a week.          Assessment: Haley Harris is an 77 year old woman who presented with shortness of breath and now has elevated cardiac enzymes. Consulted for heparin dosing. CBC unremarkable and no signs of bleeding. Goal of Therapy:  Heparin level 0.3-0.7 units/ml   Plan:  4000 units bolus 950 units/hour drip F/u heparin level @0130  11/23 F/u daily heparin levels, cbc  Desaree Downen, [place] R 06/04/2011,5:03 PM

## 2011-06-04 NOTE — Progress Notes (Signed)
06/04/11 NSG 1600  Still no response from Dr. Rito Ehrlich,  About the critical values of CKMB 9.3, Trop 1.39.  Saw Dr. Sharyn Lull,      Cardiologist up on the floor, he stated he was called by Dr. Rito Ehrlich and plan to take pt. to cath lab in am.  Will continue to monitor pt.  Forbes Cellar, RN

## 2011-06-04 NOTE — Consult Note (Signed)
Reason for Consult: Elevated CPK MB and troponin I Referring Physician: Dr. [name] is an 77 y.o. female.  HPI: Patient is 77 year old female with past medical history significant for multiple medical problems i.e. hypertension COPD history of bronchitis history of recurrent pneumonia in the past vertebrobasilar disease degenerative joint disease osteoarthritis spinal stenosis peripheral vascular disease hypercholesteremia GERD depression history of CVA of breast was admitted earlier this a.m. because of sudden onset of shortness of breath which woke her up around 2 AM associated with diaphoresis and wheezing her and was noted to be in also questionable for left lower lobe infiltrate the patient was treated with antibiotics and nebulizers with improvement in her symptoms this afternoon around 2 PM patient developed retrosternal chest pressure associated with diaphoresis shortness of breath and neck pain her received Lasix with improvement in her symptoms Cardiologic consultation is obtained as patient was noted to have elevated CPK MB and troponin I. Patient initially denied chest pain at home a but complaints complained of musculoskeletal back pain hip pain was seen at urgent care a few days ago patient denies any palpitation lightheadedness or syncope presently patient denies any chest pain nausea vomiting diaphoresis states she is feeling better   Past Medical History  Diagnosis Date  . Allergic rhinitis, cause unspecified   . Unspecified cardiovascular disease   . Personal history of malignant neoplasm of breast   . Occlusion and stenosis of carotid artery without mention of cerebral infarction   . Chronic airway obstruction, not elsewhere classified   . Depressive disorder, not elsewhere classified   . Snoring disorder   . Esophageal reflux   . Unspecified hearing loss   . Other and unspecified hyperlipidemia   . Unspecified essential hypertension   . Malignant neoplasm of  breast (female), unspecified site   . Osteoarthrosis, unspecified whether generalized or localized, unspecified site   . Osteoporosis, unspecified   . Peripheral vascular disease, unspecified   . Unspecified urinary incontinence   . Spinal stenosis   . ACE-inhibitor cough   . Angina   . Heart murmur     aS CHILD  . Shortness of breath   . Recurrent upper respiratory infection (URI)   . Anxiety   . Pneumonia   . Neuromuscular disorder     NEROPATHY FROM STENOSIS    Past Surgical History  Procedure Date  . Mastectomy 1987    Right  . Breast reconstruction 1998    Reconstruction   . Reduction mammaplasty 1998    Left  . Mastoidectomy childhood  . Appendectomy 1948  . Lumbar laminectomy 2003  . Shoulder surgery 02/2005    Bilateral fractures with multiple surgeries  . Breast implant removal 06/12/09    right  . Abdominal hysterectomy   . Fracture surgery     fracture right elbow    Family History  Problem Relation Age of Onset  . Heart attack Father 39  . Cancer Mother     ovarian  . Cancer Brother     lung  . Arthritis      family    Social History:  reports that she quit smoking about 36 years ago. She has never used smokeless tobacco. She reports that she drinks alcohol. She reports that she does not use illicit drugs.  Allergies:  Allergies  Allergen Reactions  . Augmentin Anaphylaxis  . Ace Inhibitors   . Amoxicillin     REACTION: Angioedema requiring intubation  . Cephalexin   .  Clarithromycin   . Nifedipine   . Olmesartan Medoxomil     REACTION: cough  . Tramadol Hcl     Medications: I have reviewed the patient's current medications.  Results for orders placed during the hospital encounter of 06/04/11 (from the past 48 hour(s))  CBC     Status: Abnormal   Collection Time   06/04/11  4:36 AM      Component Value Range Comment   WBC 11.6 (*) 4.0 - 10.5 (K/uL)    RBC 3.51 (*) 3.87 - 5.11 (MIL/uL)    Hemoglobin 11.5 (*) 12.0 - 15.0 (g/dL)     HCT 62.1 (*) 30.8 - 46.0 (%)    MCV 94.6  78.0 - 100.0 (fL)    MCH 32.8  26.0 - 34.0 (pg)    MCHC 34.6  30.0 - 36.0 (g/dL)    RDW 65.7  84.6 - 96.2 (%)    Platelets 247  150 - 400 (K/uL)   DIFFERENTIAL     Status: Abnormal   Collection Time   06/04/11  4:36 AM      Component Value Range Comment   Neutrophils Relative 90 (*) 43 - 77 (%)    Neutro Abs 10.4 (*) 1.7 - 7.7 (K/uL)    Lymphocytes Relative 5 (*) 12 - 46 (%)    Lymphs Abs 0.5 (*) 0.7 - 4.0 (K/uL)    Monocytes Relative 5  3 - 12 (%)    Monocytes Absolute 0.6  0.1 - 1.0 (K/uL)    Eosinophils Relative 1  0 - 5 (%)    Eosinophils Absolute 0.1  0.0 - 0.7 (K/uL)    Basophils Relative 0  0 - 1 (%)    Basophils Absolute 0.0  0.0 - 0.1 (K/uL)   BASIC METABOLIC PANEL     Status: Abnormal   Collection Time   06/04/11  4:36 AM      Component Value Range Comment   Sodium 123 (*) 135 - 145 (mEq/L)    Potassium 4.2  3.5 - 5.1 (mEq/L)    Chloride 86 (*) 96 - 112 (mEq/L)    CO2 24  19 - 32 (mEq/L)    Glucose, Bld 240 (*) 70 - 99 (mg/dL)    BUN 30 (*) 6 - 23 (mg/dL)    Creatinine, Ser 9.52  0.50 - 1.10 (mg/dL)    Calcium 9.0  8.4 - 10.5 (mg/dL)    GFR calc non Af Amer 66 (*) >90 (mL/min)    GFR calc Af Amer 76 (*) >90 (mL/min)   POCT I-STAT TROPONIN I     Status: Normal   Collection Time   06/04/11  4:52 AM      Component Value Range Comment   Troponin i, poc 0.04  0.00 - 0.08 (ng/mL)    Comment 3            POCT I-STAT 3, BLOOD GAS (G3+)     Status: Abnormal   Collection Time   06/04/11  5:17 AM      Component Value Range Comment   pH, Arterial 7.459 (*) 7.350 - 7.400     pCO2 arterial 33.4 (*) 35.0 - 45.0 (mmHg)    pO2, Arterial 109.0 (*) 80.0 - 100.0 (mmHg)    Bicarbonate 23.7  20.0 - 24.0 (mEq/L)    TCO2 25  0 - 100 (mmol/L)    O2 Saturation 99.0      Patient temperature 98.6 F      Collection site RADIAL,  ALLEN'S TEST ACCEPTABLE      Drawn by Operator      Sample type ARTERIAL     PRO B NATRIURETIC PEPTIDE     Status:  Abnormal   Collection Time   06/04/11  9:12 AM      Component Value Range Comment   BNP, POC 4348.0 (*) 0 - 450 (pg/mL)   CARDIAC PANEL(CRET KIN+CKTOT+MB+TROPI)     Status: Abnormal   Collection Time   06/04/11  2:03 PM      Component Value Range Comment   Total CK 91  7 - 177 (U/L)    CK, MB 9.3 (*) 0.3 - 4.0 (ng/mL)    Troponin I 1.39 (*) <0.30 (ng/mL)    Relative Index RELATIVE INDEX IS INVALID  0.0 - 2.5    CARDIAC PANEL(CRET KIN+CKTOT+MB+TROPI)     Status: Abnormal   Collection Time   06/04/11  5:46 PM      Component Value Range Comment   Total CK 87  7 - 177 (U/L)    CK, MB 7.4 (*) 0.3 - 4.0 (ng/mL) CRITICAL VALUE NOTED.  VALUE IS CONSISTENT WITH PREVIOUSLY REPORTED AND CALLED VALUE.   Troponin I 0.61 (*) <0.30 (ng/mL)    Relative Index RELATIVE INDEX IS INVALID  0.0 - 2.5    APTT     Status: Normal   Collection Time   06/04/11  5:49 PM      Component Value Range Comment   aPTT 34  24 - 37 (seconds)   PROTIME-INR     Status: Normal   Collection Time   06/04/11  5:49 PM      Component Value Range Comment   Prothrombin Time 13.3  11.6 - 15.2 (seconds)    INR 0.99  0.00 - 1.49      Dg Chest Portable 1 View  06/04/2011  *RADIOLOGY REPORT*  Clinical Data: Difficulty breathing.  PORTABLE CHEST - 1 VIEW  Comparison: 02/25/2011  Findings: Cardiomegaly.  Central vascular congestion. Perihilar airspace and interstitial prominence.  Retrocardiac opacity and moderate layering left pleural effusion.  No pneumothorax.  No acute osseous abnormality.  Aortic arch atherosclerotic calcification.  IMPRESSION: Cardiomegaly, with a pulmonary edema pattern.  Moderate left pleural effusion with associated consolidation; atelectasis versus pneumonia.  Original Report Authenticated By: Waneta Martins, M.D.    Review of Systems  Constitutional: Negative for fever and chills.  Respiratory: Positive for cough and shortness of breath.   Cardiovascular: Negative for chest pain and palpitations.   Gastrointestinal: Negative for heartburn and vomiting.  Musculoskeletal: Positive for back pain and joint pain.  Neurological: Negative for dizziness, focal weakness and headaches.   Blood pressure 125/80, pulse 56, temperature 97.1 F (36.2 C), temperature source Oral, resp. rate 20, height 5\' 6"  (1.676 m), weight 80.8 kg (178 lb 2.1 oz), SpO2 98.00%. Physical Exam  Constitutional: She appears well-developed.  HENT:  Head: Normocephalic.  Eyes: Conjunctivae are normal. No scleral icterus.  Neck: Neck supple. JVD present.  Cardiovascular: Normal rate and regular rhythm.   Murmur (Soft systolic murmur at left lower sternal border and faint S3 gallop) heard. Respiratory:       Decreased breath sounds at bases with occasional left basilar rhonchi and rales  GI: Soft. Bowel sounds are normal. There is no tenderness.  Musculoskeletal: She exhibits no edema and no tenderness.  Skin: Skin is warm and dry.   her EKG showed a sinus tachycardia left anterior fascicular block LVH poor R-wave progression from V1  to V6  Assessment/Plan: Acute non-Q-wave myocardial infarction complicated by mild CHF Probable left lower lobe pneumonia Hypertension History of cerebrovascular disease severe degenerative joint disease with osteoporosis Peripheral vascular disease Hypercholesteremia Current Depression History of CA of breast Remote tobacco abuse Plan start IV heparin per pharmacy protocol Start nitro drip Continue with aspirin beta blockers and statins Check serial enzymes Discussed at length with patient and family members regarding elevated cardiac enzymes and various options of treatment medical versus invasive left cath possible PTCA stenting its risk and benefit i.e. death MI stroke need for emergency CABG local vascular complications  etc. and consented for PCI  [facility] N 06/04/2011, 11:22 PM

## 2011-06-04 NOTE — ED Provider Notes (Signed)
History     CSN: [id number] Arrival date & time: 06/04/2011  3:52 AM   First MD Initiated Contact with Patient 06/04/11 0406      Chief Complaint  Patient presents with  . Shortness of Breath    (Consider location/radiation/quality/duration/timing/severity/associated sxs/prior treatment) Patient is a 77 y.o. female presenting with shortness of breath. The history is provided by the patient.  Shortness of Breath  The current episode started today. The onset was gradual. The problem occurs continuously. The problem has been unchanged. The problem is moderate. The symptoms are relieved by nothing. The symptoms are aggravated by nothing. Associated symptoms include shortness of breath and wheezing. Pertinent negatives include no chest pain and no fever. She was not exposed to toxic fumes. She has had no prior ICU admissions. She has had no prior intubations. Her past medical history is significant for past wheezing. Urine output has been normal. The last void occurred less than 6 hours ago. There were no sick contacts. Recently, medical care has been given by the PCP.   moderate in severity. No pain. She woke up tonight and called EMS for shortness of breath and wheezing. Initial room air pulse ox reported 84% and given wheezes patient was placed on BiPAP in the field. No reported fevers. History of pneumonia about 5 months ago. Symptoms worse with exertion and improved with oxygen and breathing treatment.  Past Medical History  Diagnosis Date  . Allergic rhinitis, cause unspecified   . Unspecified cardiovascular disease   . Personal history of malignant neoplasm of breast   . Occlusion and stenosis of carotid artery without mention of cerebral infarction   . Chronic airway obstruction, not elsewhere classified   . Depressive disorder, not elsewhere classified   . Snoring disorder   . Esophageal reflux   . Unspecified hearing loss   . Other and unspecified hyperlipidemia   . Unspecified  essential hypertension   . Malignant neoplasm of breast (female), unspecified site   . Osteoarthrosis, unspecified whether generalized or localized, unspecified site   . Osteoporosis, unspecified   . Peripheral vascular disease, unspecified   . Unspecified urinary incontinence   . Spinal stenosis   . ACE-inhibitor cough     Past Surgical History  Procedure Date  . Mastectomy 1987    Right  . Breast reconstruction 1998    Reconstruction   . Reduction mammaplasty 1998    Left  . Mastoidectomy childhood  . Appendectomy 1948  . Lumbar laminectomy 2003  . Shoulder surgery 02/2005    Bilateral fractures with multiple surgeries  . Breast implant removal 06/12/09    right  . Abdominal hysterectomy     Family History  Problem Relation Age of Onset  . Heart attack Father 44  . Cancer Mother     ovarian  . Cancer Brother     lung  . Arthritis      family    History  Substance Use Topics  . Smoking status: Former Smoker    Quit date: 07/13/1974  . Smokeless tobacco: Never Used  . Alcohol Use: Yes    OB History    Grav Para Term Preterm Abortions TAB SAB Ect Mult Living                  Review of Systems  Constitutional: Negative for fever and chills.  HENT: Negative for neck pain and neck stiffness.   Eyes: Negative for pain.  Respiratory: Positive for shortness of breath and wheezing.  Cardiovascular: Negative for chest pain.  Gastrointestinal: Negative for abdominal pain.  Genitourinary: Negative for dysuria.  Musculoskeletal: Negative for back pain.  Skin: Negative for rash.  Neurological: Negative for headaches.  All other systems reviewed and are negative.    Allergies  Augmentin; Ace inhibitors; Amoxicillin; Cephalexin; Clarithromycin; Nifedipine; Olmesartan medoxomil; and Tramadol hcl  Home Medications   Current Outpatient Rx  Name Route Sig Dispense Refill  . ALBUTEROL SULFATE HFA 108 (90 BASE) MCG/ACT IN AERS Inhalation Inhale 2 puffs into the  lungs every 6 (six) hours as needed for wheezing. 1 Inhaler 1  . ALBUTEROL SULFATE (2.5 MG/3ML) 0.083% IN NEBU Nebulization Take 2.5 mg by nebulization once.      . ASPIRIN 81 MG PO TABS Oral Take 81 mg by mouth daily.      Marland Kitchen BISOPROLOL-HYDROCHLOROTHIAZIDE 5-6.25 MG PO TABS Oral Take 1 tablet by mouth daily.      Marland Kitchen CALTRATE 600+D PO Oral Take 1 tablet by mouth daily.      Marland Kitchen CETIRIZINE HCL 10 MG PO TABS Oral Take 10 mg by mouth 2 (two) times daily as needed.      Marland Kitchen CLONAZEPAM 0.5 MG PO TABS Oral Take 0.5 mg by mouth 2 (two) times daily as needed.      Marland Kitchen FLUOXETINE HCL 20 MG PO CAPS Oral Take 1 capsule (20 mg total) by mouth daily. 90 capsule 3  . FLUTICASONE PROPIONATE 50 MCG/ACT NA SUSP Nasal Place 2 sprays into the nose daily. 16 g 11  . HYDROCODONE-ACETAMINOPHEN 10-500 MG PO TABS Oral Take 1 tablet by mouth every 6 (six) hours as needed. pain     . IPRATROPIUM BROMIDE 0.02 % IN SOLN Nebulization Take 500 mcg by nebulization once.      Marland Kitchen MONTELUKAST SODIUM 10 MG PO TABS Oral Take 10 mg by mouth daily as needed. For severe resistant allergy symptoms      . ONE-DAILY MULTI VITAMINS PO TABS Oral Take 1 tablet by mouth daily.      Marland Kitchen OMEPRAZOLE 20 MG PO CPDR Oral Take 1 capsule (20 mg total) by mouth 2 (two) times daily. 90 capsule 3  . OXYCODONE-ACETAMINOPHEN 5-325 MG PO TABS Oral Take 2 tablets by mouth every 4 (four) hours as needed for pain. 15 tablet 0  . PRAVASTATIN SODIUM 80 MG PO TABS Oral Take 1 tablet (80 mg total) by mouth at bedtime. 90 tablet 3  . TRIAMTERENE-HCTZ 37.5-25 MG PO TABS Oral Take 1 tablet by mouth daily.      Marland Kitchen VITAMIN B-12 1000 MCG PO TABS Oral Take 1,000 mcg by mouth 3 (three) times a week.        BP 121/81  Pulse 93  Temp(Src) 97.5 F (36.4 C) (Oral)  Resp 20  SpO2 99%  Physical Exam  Constitutional: She is oriented to person, place, and time. She appears well-developed and well-nourished.  HENT:  Head: Normocephalic and atraumatic.  Eyes: Conjunctivae and EOM  are normal. Pupils are equal, round, and reactive to light.  Neck: Full passive range of motion without pain. Neck supple. No tracheal deviation present. No thyromegaly present.  Cardiovascular: Normal rate, regular rhythm, S1 normal, S2 normal and intact distal pulses.   Pulmonary/Chest: No stridor.       Tachypnea with bilateral coarse breath sounds and intermittent wheezes.   Abdominal: Soft. Bowel sounds are normal. There is no tenderness. There is no CVA tenderness.  Musculoskeletal: Normal range of motion.  Neurological: She is alert and oriented  to person, place, and time. She has normal strength and normal reflexes. No cranial nerve deficit or sensory deficit. She displays a negative Romberg sign. GCS eye subscore is 4. GCS verbal subscore is 5. GCS motor subscore is 6.  Skin: Skin is warm and dry. No rash noted. No cyanosis. Nails show no clubbing.  Psychiatric: She has a normal mood and affect. Her speech is normal and behavior is normal.    ED Course  Procedures (including critical care time)  Labs Reviewed  CBC - Abnormal; Notable for the following:    WBC 11.6 (*)    RBC 3.51 (*)    Hemoglobin 11.5 (*)    HCT 33.2 (*)    All other components within normal limits  DIFFERENTIAL - Abnormal; Notable for the following:    Neutrophils Relative 90 (*)    Neutro Abs 10.4 (*)    Lymphocytes Relative 5 (*)    Lymphs Abs 0.5 (*)    All other components within normal limits  BASIC METABOLIC PANEL - Abnormal; Notable for the following:    Sodium 123 (*)    Chloride 86 (*)    Glucose, Bld 240 (*)    BUN 30 (*)    GFR calc non Af Amer 66 (*)    GFR calc Af Amer 76 (*)    All other components within normal limits  POCT I-STAT 3, BLOOD GAS (G3+) - Abnormal; Notable for the following:    pH, Arterial 7.459 (*)    pCO2 arterial 33.4 (*)    pO2, Arterial 109.0 (*)    All other components within normal limits  POCT I-STAT TROPONIN I  I-STAT TROPONIN I   Dg Chest Portable 1  View  06/04/2011  *RADIOLOGY REPORT*  Clinical Data: Difficulty breathing.  PORTABLE CHEST - 1 VIEW  Comparison: 02/25/2011  Findings: Cardiomegaly.  Central vascular congestion. Perihilar airspace and interstitial prominence.  Retrocardiac opacity and moderate layering left pleural effusion.  No pneumothorax.  No acute osseous abnormality.  Aortic arch atherosclerotic calcification.  IMPRESSION: Cardiomegaly, with a pulmonary edema pattern.  Moderate left pleural effusion with associated consolidation; atelectasis versus pneumonia.  Original Report Authenticated By: Waneta Martins, M.D.    Date: 06/04/2011  Rate: 106  Rhythm: sinus tachycardia  QRS Axis: left  Intervals: normal  ST/T Wave abnormalities: nonspecific ST changes  Conduction Disutrbances:nonspecific intraventricular conduction delay  Narrative Interpretation:   Old EKG Reviewed:     MDM  IV oxygen albuterol and prednisone. Patient weaned from BiPAP to nasal cannula. ABG noted. Labs reviewed as above. Chest x-ray reviewed as above patient treated for pneumonia. Medicine consultation for admit to telemetry        Sunnie Nielsen, MD 06/04/11 [phone]

## 2011-06-04 NOTE — ED Notes (Signed)
Respiratory at bedside for pt evaluation.

## 2011-06-04 NOTE — Progress Notes (Addendum)
06/04/11 NSG 1305 Orders received to place on telemetry and for IV lasix 40 mg. Place on telemetry, HR 90's NSR,  BP was 154/99 prior to lasix IV and pt. Stated Pain level was 4 on scale of 0-10.  Lasix given  @ 1315, emptied 350 ml of urine prior to adminstration.  VS post lasix adminstration was 98.2, 21, 92, 144/85 and chest pain was down to 2.  Will continue to monitor.  Forbes Cellar, RN

## 2011-06-04 NOTE — Progress Notes (Signed)
Pt. c/o of chest pain mid sternal, non radiating '3',  BP137/84 HR 94, sats 99% on 2L.  Also c/o of tingling in fingers and cold feet.  Dr. Rito Ehrlich texted paged and vicodin given.  Will continue to monitor and await for return call.  Forbes Cellar, RN

## 2011-06-04 NOTE — ED Notes (Signed)
Pt reports having chest tightness, MD made aware and repeat EKG ordered and completed.

## 2011-06-05 ENCOUNTER — Other Ambulatory Visit: Payer: Self-pay

## 2011-06-05 ENCOUNTER — Encounter (HOSPITAL_COMMUNITY)
Admission: EM | Disposition: A | Discharge status: Skilled Nursing Facility | Payer: Self-pay | Source: Home / Self Care | Attending: Internal Medicine

## 2011-06-05 DIAGNOSIS — E871 Hypo-osmolality and hyponatremia: Secondary | ICD-10-CM | POA: Diagnosis present

## 2011-06-05 DIAGNOSIS — I25119 Atherosclerotic heart disease of native coronary artery with unspecified angina pectoris: Secondary | ICD-10-CM | POA: Diagnosis present

## 2011-06-05 HISTORY — PX: LEFT HEART CATHETERIZATION WITH CORONARY ANGIOGRAM: SHX5451

## 2011-06-05 LAB — BASIC METABOLIC PANEL
GFR calc Af Amer: 71 mL/min — ABNORMAL LOW (ref >90)
GFR calc non Af Amer: 61 mL/min — ABNORMAL LOW (ref >90)
Potassium: 3.9 mEq/L (ref 3.5–5.1)
Sodium: 121 mEq/L — ABNORMAL LOW (ref 135–145)

## 2011-06-05 LAB — CARDIAC PANEL(CRET KIN+CKTOT+MB+TROPI)
CK, MB: 6.2 ng/mL (ref 0.3–4.0)
Total CK: 63 U/L (ref 7–177)
Troponin I: 1.15 ng/mL (ref <0.30)

## 2011-06-05 LAB — CBC
Hemoglobin: 10.7 g/dL — ABNORMAL LOW (ref 12.0–15.0)
MCHC: 34.7 g/dL (ref 30.0–36.0)
RBC: 3.3 MIL/uL — ABNORMAL LOW (ref 3.87–5.11)

## 2011-06-05 LAB — POCT ACTIVATED CLOTTING TIME: Activated Clotting Time: 298 seconds

## 2011-06-05 LAB — HEPARIN LEVEL (UNFRACTIONATED): Heparin Unfractionated: 0.29 IU/mL — ABNORMAL LOW (ref 0.30–0.70)

## 2011-06-05 SURGERY — LEFT HEART CATHETERIZATION WITH CORONARY ANGIOGRAM
Anesthesia: LOCAL

## 2011-06-05 MED ORDER — BIVALIRUDIN 250 MG IV SOLR: 0.2500 mg/kg | Freq: Once | INTRAVENOUS | Status: DC

## 2011-06-05 MED ORDER — MORPHINE SULFATE 2 MG/ML IJ SOLN
1.0000 mg | Freq: Once | INTRAMUSCULAR | Status: AC
  Administered 2011-06-05: 1 mg via INTRAVENOUS
  Filled 2011-06-05: qty 1

## 2011-06-05 MED ORDER — LOSARTAN POTASSIUM 50 MG PO TABS
50.0000 mg | ORAL_TABLET | Freq: Every day | ORAL | Status: DC
  Administered 2011-06-05 – 2011-06-10 (×6): 50 mg via ORAL
  Filled 2011-06-05 (×6): qty 1

## 2011-06-05 MED ORDER — TICAGRELOR 90 MG PO TABS
90.0000 mg | ORAL_TABLET | Freq: Two times a day (BID) | ORAL | Status: DC
  Administered 2011-06-05 – 2011-06-10 (×10): 90 mg via ORAL
  Filled 2011-06-05 (×12): qty 1

## 2011-06-05 MED ORDER — FUROSEMIDE 10 MG/ML IJ SOLN
40.0000 mg | Freq: Two times a day (BID) | INTRAMUSCULAR | Status: DC
  Administered 2011-06-05: 40 mg via INTRAVENOUS
  Filled 2011-06-05 (×3): qty 4

## 2011-06-05 MED ORDER — SODIUM CHLORIDE 0.9 % IV SOLN
INTRAVENOUS | Status: AC
  Administered 2011-06-05 (×2): via INTRAVENOUS

## 2011-06-05 MED ORDER — LIDOCAINE HCL (PF) 1 % IJ SOLN
INTRAMUSCULAR | Status: AC
  Filled 2011-06-05: qty 30

## 2011-06-05 MED ORDER — THERA M PLUS PO TABS
1.0000 | ORAL_TABLET | Freq: Every day | ORAL | Status: DC
  Administered 2011-06-06 – 2011-06-10 (×5): 1 via ORAL
  Filled 2011-06-05 (×6): qty 1

## 2011-06-05 MED ORDER — CARVEDILOL 6.25 MG PO TABS
6.2500 mg | ORAL_TABLET | Freq: Two times a day (BID) | ORAL | Status: DC
  Administered 2011-06-05 – 2011-06-06 (×3): 6.25 mg via ORAL
  Filled 2011-06-05 (×6): qty 1

## 2011-06-05 MED ORDER — ONDANSETRON HCL 4 MG/2ML IJ SOLN: 4.0000 mg | Freq: Four times a day (QID) | INTRAMUSCULAR | Status: DC | PRN

## 2011-06-05 MED ORDER — METHYLPREDNISOLONE SODIUM SUCC 125 MG IJ SOLR
INTRAMUSCULAR | Status: AC
  Filled 2011-06-05: qty 2

## 2011-06-05 MED ORDER — HEPARIN (PORCINE) IN NACL 2-0.9 UNIT/ML-% IJ SOLN
INTRAMUSCULAR | Status: AC
  Filled 2011-06-05: qty 2000

## 2011-06-05 MED ORDER — DOCUSATE SODIUM 100 MG PO CAPS
100.0000 mg | ORAL_CAPSULE | Freq: Two times a day (BID) | ORAL | Status: DC
  Administered 2011-06-05 – 2011-06-10 (×8): 100 mg via ORAL
  Filled 2011-06-05 (×11): qty 1

## 2011-06-05 MED ORDER — HYDROCODONE-ACETAMINOPHEN 5-325 MG PO TABS
1.0000 | ORAL_TABLET | Freq: Once | ORAL | Status: AC
  Administered 2011-06-05: 1 via ORAL

## 2011-06-05 MED ORDER — NITROGLYCERIN IN D5W 200-5 MCG/ML-% IV SOLN
20.0000 ug/min | INTRAVENOUS | Status: DC
  Administered 2011-06-05: 5 ug/min via INTRAVENOUS
  Filled 2011-06-05: qty 250

## 2011-06-05 MED ORDER — BIVALIRUDIN 250 MG IV SOLR
INTRAVENOUS | Status: AC
  Filled 2011-06-05: qty 250

## 2011-06-05 MED ORDER — NITROGLYCERIN IN D5W 200-5 MCG/ML-% IV SOLN
INTRAVENOUS | Status: AC
  Filled 2011-06-05: qty 250

## 2011-06-05 MED ORDER — HYDROCODONE-ACETAMINOPHEN 5-325 MG PO TABS
ORAL_TABLET | ORAL | Status: AC
  Filled 2011-06-05: qty 1

## 2011-06-05 MED ORDER — DIPHENHYDRAMINE HCL 50 MG/ML IJ SOLN
INTRAMUSCULAR | Status: AC
  Filled 2011-06-05: qty 1

## 2011-06-05 MED ORDER — TICAGRELOR 90 MG PO TABS
ORAL_TABLET | ORAL | Status: AC
  Administered 2011-06-05: 90 mg via ORAL
  Filled 2011-06-05: qty 2

## 2011-06-05 MED ORDER — ACETAMINOPHEN 325 MG PO TABS: 650.0000 mg | ORAL_TABLET | ORAL | Status: DC | PRN

## 2011-06-05 MED ORDER — ASPIRIN 81 MG PO CHEW
81.0000 mg | CHEWABLE_TABLET | Freq: Every day | ORAL | Status: DC
  Administered 2011-06-06 – 2011-06-10 (×5): 81 mg via ORAL
  Filled 2011-06-05 (×6): qty 1

## 2011-06-05 MED ORDER — FAMOTIDINE IN NACL 20-0.9 MG/50ML-% IV SOLN
INTRAVENOUS | Status: AC
  Filled 2011-06-05: qty 50

## 2011-06-05 MED ORDER — SPIRONOLACTONE 25 MG PO TABS
25.0000 mg | ORAL_TABLET | Freq: Every day | ORAL | Status: DC
  Administered 2011-06-05 – 2011-06-10 (×6): 25 mg via ORAL
  Filled 2011-06-05 (×6): qty 1

## 2011-06-05 MED ORDER — ASPIRIN 81 MG PO CHEW
CHEWABLE_TABLET | ORAL | Status: AC
  Administered 2011-06-06: 81 mg via ORAL
  Filled 2011-06-05: qty 4

## 2011-06-05 MED ORDER — NITROGLYCERIN 0.2 MG/ML ON CALL CATH LAB
INTRAVENOUS | Status: AC
  Filled 2011-06-05: qty 1

## 2011-06-05 NOTE — Brief Op Note (Signed)
PCI report dictated on 06/05/2011 dictation number is [id number]

## 2011-06-05 NOTE — Progress Notes (Signed)
Pt going to 2500 after cardiac cath procedure. R.Sharrell Krawiec,RN 06/05/11 1000

## 2011-06-05 NOTE — Progress Notes (Addendum)
Dr. Arthor Captain was paged around 0430 for Haley Harris who was complaining of chest pain. Pts vitals were B/P-179/107, R- 24, T-97.2, P-86, O2-99% on 2L of oxygen. EKG done. Pt was at normal sinus rhythm. Told to give pt 1/2 mg of morphine & to continue to monitor. Gwynne Edinger, RN

## 2011-06-05 NOTE — Progress Notes (Signed)
DR. Sharyn Lull called to check on Haley Harris. I informed him of her complaints of chest pain, SOB, abnormal labs, as well as gave him her Vital signs & EKG results. Dr. Informed me to cancel the Cardiac Cath procedure scheduled for this morning due to the patient's low sodium level & to continue to monitor. Gwynne Edinger, RN

## 2011-06-05 NOTE — Progress Notes (Signed)
Site area: right groin  Site Prior to Removal:  Level 0  Pressure Applied For 20 MINUTES    Minutes Beginning at 1420  Manual:   yes  Patient Status During Pull:  VSS  Post Pull Groin Site:  Level 0  Post Pull Instructions Given:  yes  Post Pull Pulses Present:  yes  Dressing Applied:  yes  Comments:

## 2011-06-05 NOTE — Progress Notes (Signed)
Subjective: Patient seen status post cardiac catheterization. She is tired, although overall she, states she's feeling better. No chest pain. She states her breathing is better. Occasional cough.  Objective: Weight change:   Intake/Output Summary (Last 24 hours) at 06/05/11 1315 Last data filed at 06/05/11 0600  Gross per 24 hour  Intake 1034.82 ml  Output   2425 ml  Net -1390.18 ml   BP 142/79  Pulse 77  Temp(Src) 97.9 F (36.6 C) (Oral)  Resp 20  Ht 5\' 6"  (1.676 m)  Wt 78.654 kg (173 lb 6.4 oz)  BMI 27.99 kg/m2  SpO2 97% General appearance: alert, cooperative, appears stated age, fatigued and no distress Lungs: Mostly clear with some decreased breath sounds at the bases Heart: Regular rate and rhythm, S1, S2, soft 2/6 systolic ejection murmur Abdomen: soft, non-tender; bowel sounds normal; no masses,  no organomegaly Extremities: Trace pitting edema  Lab Results: Basic Metabolic Panel:  Basename 06/05/11 0218 06/04/11 0436  NA 121* 123*  K 3.9 4.2  CL 83* 86*  CO2 28 24  GLUCOSE 145* 240*  BUN 22 30*  CREATININE 0.87 0.82  CALCIUM 8.9 9.0  MG -- --  PHOS -- --   Liver Function Tests: No results found for this basename: AST:2,ALT:2,ALKPHOS:2,BILITOT:2,PROT:2,ALBUMIN:2 in the last 72 hours No results found for this basename: LIPASE:2,AMYLASE:2 in the last 72 hours No results found for this basename: AMMONIA:2 in the last 72 hours CBC:  Basename 06/05/11 0218 06/04/11 0436  WBC 9.8 11.6*  NEUTROABS -- 10.4*  HGB 10.7* 11.5*  HCT 30.8* 33.2*  MCV 93.3 94.6  PLT 264 247   Cardiac Enzymes:  Basename 06/05/11 1153 06/05/11 0219 06/04/11 1746  CKTOTAL 63 76 87  CKMB 5.9* 6.2* 7.4*  CKMBINDEX -- -- --  TROPONINI 0.70* 1.15* 0.61*   BNP:  Basename 06/04/11 0912  POCBNP 4348.0*   D-Dimer: No results found for this basename: DDIMER:2 in the last 72 hours CBG: No results found for this basename: GLUCAP:6 in the last 72 hours Hemoglobin A1C: No results  found for this basename: HGBA1C in the last 72 hours Fasting Lipid Panel: No results found for this basename: CHOL,HDL,LDLCALC,TRIG,CHOLHDL,LDLDIRECT in the last 72 hours Thyroid Function Tests: No results found for this basename: TSH,T4TOTAL,FREET4,T3FREE,THYROIDAB in the last 72 hours Anemia Panel: No results found for this basename: VITAMINB12,FOLATE,FERRITIN,TIBC,IRON,RETICCTPCT in the last 72 hours Coagulation:  Basename 06/04/11 1749  LABPROT 13.3  INR 0.99   Studies/Results: Dg Hip Complete Left   Medications: Scheduled Meds:   . albuterol  2.5 mg Nebulization Q6H  . albuterol      . aspirin      . aspirin  81 mg Oral Daily  . bivalirudin      . bivalirudin  0.25 mg/kg Intravenous Once  . calcium-vitamin D  1 tablet Oral Daily  . carvedilol  6.25 mg Oral BID WC  . diphenhydrAMINE      . famotidine      . FLUoxetine  20 mg Oral Daily  . fluticasone  2 spray Each Nare Daily  . heparin  4,000 Units Intravenous Once  . heparin      . HYDROcodone-acetaminophen  1 tablet Oral Once  . ipratropium      . ipratropium      . ipratropium  0.5 mg Nebulization Q6H  . lidocaine      . losartan  50 mg Oral Daily  . methylPREDNISolone sodium succinate      .  morphine injection  1 mg Intravenous Once  . moxifloxacin  400 mg Intravenous Q24H  . multivitamins ther. w/minerals  1 tablet Oral Daily  . nitroGLYCERIN      . pantoprazole  40 mg Oral Q1200  . simvastatin  40 mg Oral q1800  . spironolactone  25 mg Oral Daily  . Ticagrelor      . Ticagrelor  90 mg Oral BID  . vitamin B-12  1,000 mcg Oral 3 times weekly  . DISCONTD: aspirin EC  81 mg Oral Daily  . DISCONTD: bisoprolol  5 mg Oral Daily  . DISCONTD: enoxaparin  40 mg Subcutaneous Daily  . DISCONTD: furosemide  40 mg Intravenous Q12H  . DISCONTD: multivitamin  1 tablet Oral Daily  . DISCONTD: nitroGLYCERIN  1 inch Topical Q6H   Continuous Infusions:   . sodium chloride 100 mL/hr at 06/05/11 1126  . nitroGLYCERIN  5 mcg/min (06/05/11 1059)  . DISCONTD: sodium chloride 20 mL/hr at 06/05/11 0600  . DISCONTD: heparin 1,100 Units/hr (06/05/11 0429)   PRN Meds:.acetaminophen, albuterol, alum & mag hydroxide-simeth, clonazePAM, HYDROcodone-acetaminophen, ondansetron (ZOFRAN) IV, ondansetron, DISCONTD: acetaminophen, DISCONTD: acetaminophen, DISCONTD: ondansetron (ZOFRAN) IV  Assessment/Plan: Patient Active Hospital Problem List: Acute systolic congestive heart failure (01/16/2011)  currently getting IV fluids. Status post cardiac catheterization. We'll then resume diuresis. Patient may benefit from Coreg and ACE inhibitor.  CAD (coronary artery disease), native coronary artery (06/05/2011)  status post cardiac catheterization with 90% stenosis of the inferior RCA. Status post PCI. Appreciate cardiology help.  Hyponatremia (06/05/2011)  there may be some part because of thiazide diuretics, however, most of this is likely from CHF.  As we diurese, this should improve.  HYPERTENSION (10/30/2006)  stable, will adjust some of her blood pressure medications to better help her congestive heart failure.  COPD (10/30/2006)  stable, her issues looked to be more related to CHF.  HYPERLIPIDEMIA (10/30/2006)  currently on statin.  CAROTID ARTERY STENOSIS (07/19/2009)  stable. Noted.  ALLERGIC RHINITIS (12/18/2009)  stable.  GERD (10/30/2006)  on PPI. Stable.  OSTEOARTHRITIS (10/30/2006)  stable  OSTEOPOROSIS (10/30/2006)  stable   LOS: 1 day   KRISHNAN,SENDIL K 06/05/2011, 1:15 PM

## 2011-06-05 NOTE — Progress Notes (Signed)
Notified Dr. Sharyn Lull about giving pt her dose of Brillinta (90mg ) [after receiving 180mg  of Brillinta in the cath lab].  He is aware. No orders received.

## 2011-06-05 NOTE — Progress Notes (Signed)
ANTICOAGULATION CONSULT NOTE - Follow Up Consult  Pharmacy Consult for heparin Indication: chest pain/ACS  Allergies  Allergen Reactions  . Augmentin Anaphylaxis  . Ace Inhibitors   . Amoxicillin     REACTION: Angioedema requiring intubation  . Cephalexin   . Clarithromycin   . Nifedipine   . Olmesartan Medoxomil     REACTION: cough  . Tramadol Hcl     Patient Measurements: Height: 5\' 6"  (167.6 cm) Weight: 178 lb 2.1 oz (80.8 kg) IBW/kg (Calculated) : 59.3  Adjusted Body Weight: 76.1  Vital Signs: Temp: 97.1 F (36.2 C) (11/22 2100) Temp src: Oral (11/22 2100) BP: 125/80 mmHg (11/22 2100) Pulse Rate: 56  (11/22 2100)  Labs:  Basename 06/05/11 0218 06/04/11 1749 06/04/11 1746 06/04/11 1403 06/04/11 0436  HGB 10.7* -- -- -- 11.5*  HCT 30.8* -- -- -- 33.2*  PLT 264 -- -- -- 247  APTT -- 34 -- -- --  LABPROT -- 13.3 -- -- --  INR -- 0.99 -- -- --  HEPARINUNFRC 0.29* -- -- -- --  CREATININE 0.87 -- -- -- 0.82  CKTOTAL -- -- 87 91 --  CKMB -- -- 7.4* 9.3* --  TROPONINI -- -- 0.61* 1.39* --   Estimated Creatinine Clearance: 55.3 ml/min (by C-G formula based on Cr of 0.87).   Medications:  Scheduled:    . albuterol  2.5 mg Nebulization Q6H  . albuterol  5 mg Nebulization Once  . albuterol      . aspirin EC  81 mg Oral Daily  . bisoprolol  5 mg Oral Daily  . calcium-vitamin D  1 tablet Oral Daily  . fentaNYL  25 mcg Intravenous Once  . FLUoxetine  20 mg Oral Daily  . fluticasone  2 spray Each Nare Daily  . furosemide  40 mg Intravenous Q12H  . heparin  4,000 Units Intravenous Once  . ipratropium      . ipratropium      . ipratropium  0.5 mg Nebulization Q6H  . methylPREDNISolone (SOLU-MEDROL) injection  125 mg Intravenous Once  . moxifloxacin  400 mg Intravenous Once  . moxifloxacin  400 mg Intravenous Q24H  . multivitamin  1 tablet Oral Daily  . nitroGLYCERIN  1 inch Topical Q6H  . ondansetron  4 mg Intravenous Once  . pantoprazole  40 mg Oral Q1200    . simvastatin  40 mg Oral q1800  . vitamin B-12  1,000 mcg Oral 3 times weekly  . DISCONTD: aspirin  81 mg Oral Daily  . DISCONTD: bisoprolol-hydrochlorothiazide  1 tablet Oral Daily  . DISCONTD: Calcium Carb-Cholecalciferol  1 tablet Oral Daily  . DISCONTD: enoxaparin  40 mg Subcutaneous Daily  . DISCONTD: furosemide  40 mg Intramuscular BID  . DISCONTD: triamterene-hydrochlorothiazide  1 each Oral Daily   Infusions:    . sodium chloride 20 mL/hr (06/04/11 1035)  . heparin 950 Units/hr (06/04/11 1819)   Assessment: 77yo female slightly subtherapeutic on heparin with initial dosing for CP.  Goal of Therapy:  Heparin level 0.3-0.7 units/ml   Plan:  Will increase gtt by ~2 units/kg/hr to 1100 units/hr and check level in 8hr.  Colleen Can PharmD BCPS 06/05/2011,3:18 AM

## 2011-06-05 NOTE — Progress Notes (Signed)
NTG drip going at 39mcg/hr upon arrival.  Titrated to reduce pressure for sheath pull to 30 mcg.  Currently titrating down to original rate.

## 2011-06-05 NOTE — Progress Notes (Signed)
Pt was provided 73free day  brilinta card. Could md please write prescription for 30day supply w no refills and prescription for one w refills. This was done by brenda graves bigelow rn

## 2011-06-05 NOTE — Progress Notes (Signed)
Utilization review completed.  

## 2011-06-05 NOTE — Cardiovascular Report (Signed)
NAMESARANYA, HARLIN NO.:  [id number]  MEDICAL RECORD NO.:  [id number]  LOCATION:  5530                         FACILITY:  [facility]  PHYSICIAN:  Eduardo Osier. Sharyn Lull, M.D. DATE OF BIRTH:  15-Jun-1929  DATE OF PROCEDURE:  06/05/2011 DATE OF DISCHARGE:                           CARDIAC CATHETERIZATION   PROCEDURE: 1. Left cardiac cath with selective left and right coronary     angiography, left ventriculography via right groin using Judkins     technique. 2. Successful percutaneous transluminal coronary angioplasty to distal     right coronary artery using 2.5 x12 mm long Claude Trek balloon. 3. Successful deployment of 3.0 x16 mm long PROMUS element drug-     eluting stent in distal right coronary artery 4. Successful postdilatation of this stent using 3.25 x12 mm long      Trek balloon.  INDICATION FOR THE PROCEDURE:  Ms. Ramanda is an 77 year old white female with past medical history significant for multiple medical problems, i.e. hypertension, COPD, history of bronchitis, history of recurrent pneumonia in the past, cerebrovascular disease, degenerative joint disease, osteoarthritis, spinal stenosis, peripheral vascular disease, hypercholesteremia, GERD, depression, history of CA of breast, was admitted yesterday because of sudden onset of shortness of breath associated with diaphoresis and wheezing and was noted to be in mild heart failure and was noted to have mildly elevated CPK-MB and troponin-I. EKG done in the ER showed sinus tachycardia with left anterior fascicular block, LVH, and poor R-wave progression from V1 to V6.  The patient denies any chest pain, but developed chest pain yesterday afternoon and earlier this morning, received morphine sulfate with relief of chest pain.  Due to typical anginal chest pain, multiple risk factors, discussed with the patient and family at length regarding left cath, possible PTCA stenting, its risks and benefits, i.e.,  death, MI, stroke, need for emergency CABG, risk of restenosis, local vascular complications, etc.  And consented for the procedure.  PROCEDURE IN DETAIL:  After obtaining the informed consent, the patient was brought to the cath lab and was placed on fluoroscopy table.  Right groin was prepped and draped in usual fashion.  Xylocaine 1% was used for local anesthesia in the right groin.  With the help of thin wall needle, 6-French arterial sheath was placed.  The sheath was aspirated and flushed.  Next, a 6-French left Judkins catheter was advanced over the wire under fluoroscopic guidance up to the ascending aorta.  Wire was pulled out. The catheter was aspirated and connected to the Manifold.  Catheter was further advanced and engaged into left coronary ostium.  Multiple views of the left system were taken.  Next, the catheter was disengaged and was pulled out over the wire and was replaced with 6-French right Judkins catheter, which was advanced over the wire under fluoroscopic guidance up to the ascending aorta. Wire was pulled out.  The catheter was aspirated and connected to the Manifold.  Catheter was further advanced and engaged into right coronary ostium.  Multiple views of the right system were taken.  Next, the catheter was disengaged and was pulled out over the wire and was replaced with a 6-French pigtail catheter.  At the end of pigtail catheter, which was advanced over the wire under fluoroscopic guidance up to the ascending aorta.  Wire was pulled out.  The catheter was aspirated and connected to the Manifold.  Catheter was further advanced across the aortic valve into the LV.  LV pressures were recorded.  Next, LV graft was done in 30 degree RAO position.  Post angiographic pressures were recorded from LV and then pullback pressures were recorded from aorta.  There was no gradient across the aortic valve.  Next, the pigtail catheter was pulled out over the wire.   Sheaths were aspirated and flushed.  FINDINGS:  LV showed anterolateral wall akinesis, EF of 35-40%.  Left main was patent.  LAD has 30-40% proximal stenosis.  Diagonal 1 and 2 were very small.  Diagonal 3 was moderate size which was patent. Diagonal 4 was very small which was patent.  Left circumflex has 10-15% ostial stenosis.  OM 1 is very small.  OM 2 is large which is patent. RCA has 90-95% distal stenosis prior to bifurcation with PDA and PLV branches.  PDA has 40-50% distal stenosis.  PLV branch is patent.  INTERVENTIONAL PROCEDURE:  Successful PTCA to distal RCA was done using 2.5 x12 mm long mini Trek balloon for predilatation and then 3.0 x16 mm long PROMUS element drug-eluting stent was deployed in distal RCA at 11 atmospheric pressure, stent was post dilated using 3.25 x12 mm long Wilder Trek balloon going up to 15 atmospheric pressure.  Lesion was dilated from 90-95% with haziness to 0% residual with excellent TIMI grade 3 distal flow without evidence of dissection or distal embolization.  The patient received weight based Angiomax and ________180 mg of Dilantin and 325 mg of aspirin during the procedure.  Her aspirin dose will be reduced to 81 mg daily.  The patient tolerated the procedure well. There were no complications.  The patient was transferred to recovery room in stable condition.     Eduardo Osier. Sharyn Lull, M.D.     [facility]/MEDQ  D:  06/05/2011  T:  06/05/2011  Job:  [id number]  cc:   [facility]

## 2011-06-06 LAB — CBC
HCT: 32 % — ABNORMAL LOW (ref 36.0–46.0)
Hemoglobin: 11.2 g/dL — ABNORMAL LOW (ref 12.0–15.0)
MCHC: 35 g/dL (ref 30.0–36.0)

## 2011-06-06 LAB — OSMOLALITY: Osmolality: 257 mOsm/kg — ABNORMAL LOW (ref 275–300)

## 2011-06-06 LAB — BASIC METABOLIC PANEL
BUN: 19 mg/dL (ref 6–23)
CO2: 28 mEq/L (ref 19–32)
CO2: 26 mEq/L (ref 19–32)
Chloride: 88 mEq/L — ABNORMAL LOW (ref 96–112)
Chloride: 84 mEq/L — ABNORMAL LOW (ref 96–112)
GFR calc Af Amer: 90 mL/min — ABNORMAL LOW (ref >90)
Glucose, Bld: 130 mg/dL — ABNORMAL HIGH (ref 70–99)
Potassium: 3.7 mEq/L (ref 3.5–5.1)
Potassium: 3.4 mEq/L — ABNORMAL LOW (ref 3.5–5.1)
Sodium: 128 mEq/L — ABNORMAL LOW (ref 135–145)

## 2011-06-06 LAB — DIFFERENTIAL
Basophils Relative: 0 % (ref 0–1)
Lymphocytes Relative: 10 % — ABNORMAL LOW (ref 12–46)
Lymphs Abs: 1.1 10*3/uL (ref 0.7–4.0)
Monocytes Relative: 11 % (ref 3–12)
Neutro Abs: 7.8 10*3/uL — ABNORMAL HIGH (ref 1.7–7.7)
Neutrophils Relative %: 77 % (ref 43–77)

## 2011-06-06 MED ORDER — METOPROLOL TARTRATE 1 MG/ML IV SOLN
5.0000 mg | Freq: Once | INTRAVENOUS | Status: DC
  Filled 2011-06-06 (×2): qty 5

## 2011-06-06 MED ORDER — FUROSEMIDE 10 MG/ML IJ SOLN
40.0000 mg | Freq: Three times a day (TID) | INTRAMUSCULAR | Status: DC
  Administered 2011-06-06 – 2011-06-08 (×5): 40 mg via INTRAVENOUS
  Filled 2011-06-06 (×7): qty 4

## 2011-06-06 MED ORDER — FUROSEMIDE 80 MG PO TABS
80.0000 mg | ORAL_TABLET | Freq: Once | ORAL | Status: AC
  Administered 2011-06-06: 80 mg via ORAL
  Filled 2011-06-06: qty 1

## 2011-06-06 MED ORDER — WHITE PETROLATUM GEL
Status: AC
  Filled 2011-06-06: qty 5

## 2011-06-06 NOTE — Progress Notes (Signed)
*  PRELIMINARY RESULTS* Echocardiogram 2D Echocardiogram has been performed.  Clide Deutscher RDCS 06/06/2011, 10:29 AM

## 2011-06-06 NOTE — Progress Notes (Signed)
Subjective: Fatigued.  Still some shortness of breath. Overall she's feeling much better  Objective: Weight change:   Intake/Output Summary (Last 24 hours) at 06/06/11 1454 Last data filed at 06/06/11 0830  Gross per 24 hour  Intake 1726.67 ml  Output   2651 ml  Net -924.33 ml   BP 152/87  Pulse 70  Temp(Src) 97.7 F (36.5 C) (Oral)  Resp 20  Ht 5\' 6"  (1.676 m)  Wt 78.8 kg (173 lb 11.6 oz)  BMI 28.04 kg/m2  SpO2 98% General appearance: alert, cooperative, appears stated age, fatigued and no distress Lungs: Mostly clear with some decreased breath sounds at the bases Heart: Regular rate and rhythm, S1, S2, soft 2/6 systolic ejection murmur Abdomen: soft, non-tender; bowel sounds normal; no masses,  no organomegaly Extremities: Trace pitting edema  Lab Results: Basic Metabolic Panel:  Basename 06/06/11 1339 06/06/11 0530  NA 122* 128*  K 3.4* 3.7  CL 84* 88*  CO2 26 28  GLUCOSE 130* 127*  BUN 19 20  CREATININE 0.76 0.77  CALCIUM 8.6 8.4  MG -- --  PHOS -- --   CBC:  Basename 06/06/11 1339 06/05/11 0218 06/04/11 0436  WBC 10.1 9.8 --  NEUTROABS 7.8* -- 10.4*  HGB 11.2* 10.7* --  HCT 32.0* 30.8* --  MCV 93.6 93.3 --  PLT 272 264 --   Cardiac Enzymes:  Basename 06/05/11 1153 06/05/11 0219 06/04/11 1746  CKTOTAL 63 76 87  CKMB 5.9* 6.2* 7.4*  CKMBINDEX -- -- --  TROPONINI 0.70* 1.15* 0.61*   BNP:  Basename 06/04/11 0912  POCBNP 4348.0*  Coagulation:  Basename 06/04/11 1749  LABPROT 13.3  INR 0.99    Medications: Scheduled Meds:    . albuterol  2.5 mg Nebulization Q6H  . aspirin  81 mg Oral Daily  . bivalirudin  0.25 mg/kg Intravenous Once  . calcium-vitamin D  1 tablet Oral Daily  . carvedilol  6.25 mg Oral BID WC  . docusate sodium  100 mg Oral BID  . FLUoxetine  20 mg Oral Daily  . fluticasone  2 spray Each Nare Daily  . furosemide  40 mg Intravenous Q12H  . furosemide  80 mg Oral Once  . ipratropium  0.5 mg Nebulization Q6H  .  losartan  50 mg Oral Daily  . metoprolol  5 mg Intravenous Once  . multivitamins ther. w/minerals  1 tablet Oral Daily  . pantoprazole  40 mg Oral Q1200  . simvastatin  40 mg Oral q1800  . spironolactone  25 mg Oral Daily  . Ticagrelor  90 mg Oral BID  . vitamin B-12  1,000 mcg Oral 3 times weekly  . white petrolatum       Continuous Infusions:    . sodium chloride 100 mL/hr at 06/05/11 1530  . DISCONTD: nitroGLYCERIN 5 mcg/min (06/05/11 1059)   PRN Meds:.acetaminophen, albuterol, alum & mag hydroxide-simeth, clonazePAM, HYDROcodone-acetaminophen, ondansetron (ZOFRAN) IV, ondansetron  Assessment/Plan: Patient Active Hospital Problem List: Non-ST elevated MI: Status post catheterization and PCI  Acute systolic congestive heart failure (01/16/2011)  continue diuresis, continues to diurese well. Given normal renal function. We'll try to be more aggressive.  CAD (coronary artery disease), native coronary artery (06/05/2011)  status post cardiac catheterization with 90% stenosis of the inferior RCA. Status post PCI. Appreciate cardiology help.  Hyponatremia (06/05/2011)  there may be some part because of thiazide diuretics, however, most of this is likely from CHF.  As we diurese, this should improve.  Up to 128.  This morning, then back down to 122. We'll go ahead and check a serum and urine osmolarity.  HYPERTENSION (10/30/2006)  stable, will adjust some of her blood pressure medications to better help her congestive heart failure. Pressures higher. This morning, until she received her blood pressure medication  COPD (10/30/2006)  stable, her issues looked to be more related to CHF.  HYPERLIPIDEMIA (10/30/2006)  currently on statin.  CAROTID ARTERY STENOSIS (07/19/2009)  stable. Noted.  ALLERGIC RHINITIS (12/18/2009)  stable.  GERD (10/30/2006)  on PPI. Stable.  OSTEOARTHRITIS (10/30/2006)  stable  OSTEOPOROSIS (10/30/2006)  stable   LOS: 2 days   Tauheed Mcfayden  K 06/06/2011, 2:54 PM

## 2011-06-06 NOTE — Progress Notes (Signed)
New order to d/c NTG, reduced NTG to 3, BP 141/78

## 2011-06-06 NOTE — Progress Notes (Signed)
Subjective:  Patient denies chest pain or shortness of breath States overall feels better and breathing is improved Complains of musculoskeletal a shoulder pain Objective:  Vital Signs in the last 24 hours: Temp:  [97.5 F (36.4 C)-98 F (36.7 C)] 97.7 F (36.5 C) (11/24 1249) Pulse Rate:  [70-114] 70  (11/24 1249) Resp:  [15-20] 20  (11/24 1249) BP: (137-180)/(72-102) 152/87 mmHg (11/24 1249) SpO2:  [94 %-100 %] 99 % (11/24 1249) Weight:  [78.8 kg (173 lb 11.6 oz)] 173 lb 11.6 oz (78.8 kg) (11/24 0721)  Intake/Output from previous day: 11/23 0701 - 11/24 0700 In: 1346.7 [P.O.:240; I.V.:1106.7] Out: 2651 [Urine:2650; Stool:1] Intake/Output from this shift: Total I/O In: 480 [P.O.:480] Out: -   Physical Exam: General appearance: alert and cooperative Neck: no adenopathy, no carotid bruit, no JVD and supple, symmetrical, trachea midline Lungs: Decreased breath sounds at bases with occasional rhonchi and trace Heart: regular rate and rhythm, S1, S2 normal and S3 present Abdomen: soft, non-tender; bowel sounds normal; no masses,  no organomegaly Extremities: extremities normal, atraumatic, no cyanosis or edema Her right groin with minimal phimosis no hematoma or bruit Lab Results:  Basename 06/05/11 0218 06/04/11 0436  WBC 9.8 11.6*  HGB 10.7* 11.5*  PLT 264 247    Basename 06/06/11 0530 06/05/11 0218  NA 128* 121*  K 3.7 3.9  CL 88* 83*  CO2 28 28  GLUCOSE 127* 145*  BUN 20 22  CREATININE 0.77 0.87    Basename 06/05/11 1153 06/05/11 0219  TROPONINI 0.70* 1.15*   Hepatic Function Panel No results found for this basename: PROT,ALBUMIN,AST,ALT,ALKPHOS,BILITOT,BILIDIR,IBILI in the last 72 hours No results found for this basename: CHOL in the last 72 hours No results found for this basename: PROTIME in the last 72 hours  Imaging:   Cardiac Studies:  Assessment/Plan:  Status post small non-Q-wave myocardial infarction status post PCI to distal RCA Resolving  systolic heart failure Ischemic cardiomyopathy Hypertension Resolving left pneumonia Resolving hyponatremia Blurred Hypercholesteremia Remote history of tobacco abuse Degenerative joint disease Cerebrovascular disease Peripheral vascular disease Plan Continue present management Increase beta blockers and angiotensin receptor blocker as tolerated as patient has intolerance to ACE inhibitors Check labs in a.m. Phase I cardiac rehabilitation Out of bed to chair DC Foley  LOS: 2 days    Gunnar Hereford N 06/06/2011, 1:01 PM

## 2011-06-06 NOTE — Progress Notes (Signed)
BP 137/77, d/ced NTG drip per order

## 2011-06-07 LAB — CBC
HCT: 33.2 % — ABNORMAL LOW (ref 36.0–46.0)
Hemoglobin: 11.7 g/dL — ABNORMAL LOW (ref 12.0–15.0)
MCH: 33.1 pg (ref 26.0–34.0)
MCHC: 35.2 g/dL (ref 30.0–36.0)
MCV: 93.8 fL (ref 78.0–100.0)
Platelets: 252 10*3/uL (ref 150–400)
RBC: 3.54 MIL/uL — ABNORMAL LOW (ref 3.87–5.11)
RDW: 11.9 % (ref 11.5–15.5)
WBC: 10.5 10*3/uL (ref 4.0–10.5)

## 2011-06-07 LAB — URINE MICROSCOPIC-ADD ON

## 2011-06-07 LAB — URINALYSIS, ROUTINE W REFLEX MICROSCOPIC
Bilirubin Urine: NEGATIVE
Glucose, UA: NEGATIVE mg/dL
Ketones, ur: NEGATIVE mg/dL
Nitrite: NEGATIVE
Protein, ur: NEGATIVE mg/dL
Specific Gravity, Urine: 1.008 (ref 1.005–1.030)
Urobilinogen, UA: 0.2 mg/dL (ref 0.0–1.0)
pH: 6.5 (ref 5.0–8.0)

## 2011-06-07 LAB — BASIC METABOLIC PANEL
BUN: 20 mg/dL (ref 6–23)
CO2: 31 mEq/L (ref 19–32)
Calcium: 8.5 mg/dL (ref 8.4–10.5)
Chloride: 85 mEq/L — ABNORMAL LOW (ref 96–112)
Creatinine, Ser: 0.79 mg/dL (ref 0.50–1.10)
GFR calc Af Amer: 89 mL/min — ABNORMAL LOW (ref >90)
GFR calc non Af Amer: 77 mL/min — ABNORMAL LOW (ref >90)
Glucose, Bld: 96 mg/dL (ref 70–99)
Potassium: 3.4 mEq/L — ABNORMAL LOW (ref 3.5–5.1)
Sodium: 125 mEq/L — ABNORMAL LOW (ref 135–145)

## 2011-06-07 MED ORDER — ALBUTEROL SULFATE (5 MG/ML) 0.5% IN NEBU
2.5000 mg | INHALATION_SOLUTION | Freq: Four times a day (QID) | RESPIRATORY_TRACT | Status: DC | PRN
  Administered 2011-06-08 – 2011-06-09 (×5): 2.5 mg via RESPIRATORY_TRACT
  Filled 2011-06-07 (×5): qty 0.5

## 2011-06-07 MED ORDER — CARVEDILOL 12.5 MG PO TABS
12.5000 mg | ORAL_TABLET | Freq: Two times a day (BID) | ORAL | Status: DC
  Administered 2011-06-07 – 2011-06-10 (×7): 12.5 mg via ORAL
  Filled 2011-06-07 (×9): qty 1

## 2011-06-07 NOTE — Progress Notes (Signed)
Subjective: Patient doing better. Breathing better. Feeling more like herself.  Objective: Weight change:   Intake/Output Summary (Last 24 hours) at 06/07/11 1205 Last data filed at 06/07/11 1000  Gross per 24 hour  Intake    240 ml  Output   5426 ml  Net  -5186 ml   BP 158/93  Pulse 83  Temp(Src) 98.2 F (36.8 C) (Oral)  Resp 18  Ht 5\' 6"  (1.676 m)  Wt 78.8 kg (173 lb 11.6 oz)  BMI 28.04 kg/m2  SpO2 98% General appearance: alert, cooperative, appears stated age, fatigued and no distress Lungs: Mostly clear with some decreased breath sounds at the bases Heart: Regular rate and rhythm, S1, S2, soft 2/6 systolic ejection murmur Abdomen: soft, non-tender; bowel sounds normal; no masses,  no organomegaly Extremities: Trace pitting edema  Lab Results: Basic Metabolic Panel:  Basename 06/07/11 0530 06/06/11 1339  NA 125* 122*  K 3.4* 3.4*  CL 85* 84*  CO2 31 26  GLUCOSE 96 130*  BUN 20 19  CREATININE 0.79 0.76  CALCIUM 8.5 8.6  MG -- --  PHOS -- --   CBC:  Basename 06/07/11 0530 06/06/11 1339  WBC 10.5 10.1  NEUTROABS -- 7.8*  HGB 11.7* 11.2*  HCT 33.2* 32.0*  MCV 93.8 93.6  PLT 252 272   Cardiac Enzymes:  Basename 06/05/11 1153 06/05/11 0219 06/04/11 1746  CKTOTAL 63 76 87  CKMB 5.9* 6.2* 7.4*  CKMBINDEX -- -- --  TROPONINI 0.70* 1.15* 0.61*   BNP: No results found for this basename: POCBNP:3 in the last 72 hoursCoagulation:  Basename 06/04/11 1749  LABPROT 13.3  INR 0.99    Medications: Scheduled Meds:    . aspirin  81 mg Oral Daily  . bivalirudin  0.25 mg/kg Intravenous Once  . calcium-vitamin D  1 tablet Oral Daily  . carvedilol  12.5 mg Oral BID WC  . docusate sodium  100 mg Oral BID  . FLUoxetine  20 mg Oral Daily  . fluticasone  2 spray Each Nare Daily  . furosemide  40 mg Intravenous Q8H  . furosemide  80 mg Oral Once  . ipratropium  0.5 mg Nebulization Q6H  . losartan  50 mg Oral Daily  . metoprolol  5 mg Intravenous Once  .  multivitamins ther. w/minerals  1 tablet Oral Daily  . pantoprazole  40 mg Oral Q1200  . simvastatin  40 mg Oral q1800  . spironolactone  25 mg Oral Daily  . Ticagrelor  90 mg Oral BID  . vitamin B-12  1,000 mcg Oral 3 times weekly  . white petrolatum      . DISCONTD: albuterol  2.5 mg Nebulization Q6H  . DISCONTD: carvedilol  6.25 mg Oral BID WC  . DISCONTD: furosemide  40 mg Intravenous Q12H   Continuous Infusions:   PRN Meds:.acetaminophen, albuterol, alum & mag hydroxide-simeth, clonazePAM, HYDROcodone-acetaminophen, ondansetron (ZOFRAN) IV, ondansetron, DISCONTD: albuterol  Assessment/Plan: Patient Active Hospital Problem List: Non-ST elevated MI: Status post catheterization and PCI  Acute systolic congestive heart failure (01/16/2011)  continue diuresis, continues to diurese well. Recheck BNP in the morning and start to wean down. Lasix  CAD (coronary artery disease), native coronary artery (06/05/2011)  status post cardiac catheterization with 90% stenosis of the inferior RCA. Status post PCI. Appreciate cardiology help.  Hyponatremia (06/05/2011)  there may be some part because of thiazide diuretics, however, most of this is likely from CHF.  As we diurese, this should improve. Her sodium is improved  today.  HYPERTENSION (10/30/2006)  stable, will adjust some of her blood pressure medications to better help her congestive heart failure. Pressures higher. This morning, until she received her blood pressure medication  COPD (10/30/2006)  stable, her issues looked to be more related to CHF.  HYPERLIPIDEMIA (10/30/2006)  currently on statin.  CAROTID ARTERY STENOSIS (07/19/2009)  stable. Noted.  ALLERGIC RHINITIS (12/18/2009)  stable.  GERD (10/30/2006)  on PPI. Stable.  OSTEOARTHRITIS (10/30/2006)  stable  OSTEOPOROSIS (10/30/2006)  stable   LOS: 3 days   KRISHNAN,SENDIL K 06/07/2011, 12:05 PM

## 2011-06-07 NOTE — Progress Notes (Signed)
Subjective:  Patient denies any chest pain states her breathing has improved Her troponin I. his stranding down Her renal function remained stable  Objective:  Vital Signs in the last 24 hours: Temp:  [97.7 F (36.5 C)-98.2 F (36.8 C)] 98.2 F (36.8 C) (11/25 0513) Pulse Rate:  [70-94] 83  (11/25 0513) Resp:  [18-20] 18  (11/25 0513) BP: (127-180)/(64-102) 158/93 mmHg (11/25 0513) SpO2:  [95 %-99 %] 98 % (11/25 0855)  Intake/Output from previous day: 11/24 0701 - 11/25 0700 In: 480 [P.O.:480] Out: 4551 [Urine:4550; Stool:1] Intake/Output from this shift: Total I/O In: 240 [P.O.:240] Out: 875 [Urine:875]  Physical Exam: General appearance: alert and cooperative Neck: no adenopathy, no carotid bruit, no JVD and supple, symmetrical, trachea midline Lungs: diminished breath sounds bilaterally Heart: regular rate and rhythm, S1, S2 normal and Soft systolic murmur and S3 gallop Abdomen: soft, non-tender; bowel sounds normal; no masses,  no organomegaly Extremities: extremities normal, atraumatic, no cyanosis or edema Right groin a minimal ecchymosis noted No hematoma or brui Lab Results:  Basename 06/07/11 0530 06/06/11 1339  WBC 10.5 10.1  HGB 11.7* 11.2*  PLT 252 272    Basename 06/07/11 0530 06/06/11 1339  NA 125* 122*  K 3.4* 3.4*  CL 85* 84*  CO2 31 26  GLUCOSE 96 130*  BUN 20 19  CREATININE 0.79 0.76    Basename 06/05/11 1153 06/05/11 0219  TROPONINI 0.70* 1.15*   Hepatic Function Panel No results found for this basename: PROT,ALBUMIN,AST,ALT,ALKPHOS,BILITOT,BILIDIR,IBILI in the last 72 hours No results found for this basename: CHOL in the last 72 hours No results found for this basename: PROTIME in the last 72 hours  Imaging:   Cardiac Studies:  Assessment/Plan:  Status post small non-Q-wave myocardial infarction status post PCI to distal RCA doing well History of silent anterolateral wall MI in the past Ischemic cardiomyopathy Resolving systolic  heart failure Hypertension Diabetes mellitus Hypercholesteremia Cerebrovascular disease Degenerative joint disease with osteoporosis Peripheral vascular disease Resolving her her sugar left pneumonia Hyponatremia multifactorial Plan continue present management  Increase angiotensin receptor blockers as his blood pressure tolerates Consider reducing Lasix and increasing Aldactone Check chest x-ray Phase I cardiac rehabilitation OT PT evaluation  LOS: 3 days    Journie Howson N 06/07/2011, 11:24 AM

## 2011-06-08 LAB — BASIC METABOLIC PANEL
GFR calc Af Amer: 66 mL/min — ABNORMAL LOW (ref >90)
GFR calc non Af Amer: 57 mL/min — ABNORMAL LOW (ref >90)
Potassium: 3.3 mEq/L — ABNORMAL LOW (ref 3.5–5.1)
Sodium: 124 mEq/L — ABNORMAL LOW (ref 135–145)

## 2011-06-08 MED ORDER — FUROSEMIDE 40 MG PO TABS
40.0000 mg | ORAL_TABLET | Freq: Two times a day (BID) | ORAL | Status: DC
  Administered 2011-06-08 – 2011-06-09 (×5): 40 mg via ORAL
  Filled 2011-06-08 (×4): qty 1

## 2011-06-08 MED FILL — Dextrose Inj 5%: INTRAVENOUS | Qty: 50 | Status: AC

## 2011-06-08 NOTE — Progress Notes (Signed)
Subjective:  Patient denies any chest pain or shortness of breath States the feeling much better  Objective:  Vital Signs in the last 24 hours: Temp:  [97.6 F (36.4 C)-98 F (36.7 C)] 97.6 F (36.4 C) (11/26 0418) Pulse Rate:  [67-81] 67  (11/26 0418) Resp:  [18-19] 19  (11/26 0418) BP: (108-143)/(59-83) 143/83 mmHg (11/26 0418) SpO2:  [93 %-100 %] 93 % (11/26 1013) Weight:  [76.386 kg (168 lb 6.4 oz)] 168 lb 6.4 oz (76.386 kg) (11/26 0418)  Intake/Output from previous day: 11/25 0701 - 11/26 0700 In: 540 [P.O.:540] Out: 2925 [Urine:2925] Intake/Output from this shift: Total I/O In: 240 [P.O.:240] Out: 1000 [Urine:1000]  Physical Exam: General appearance: alert and cooperative Neck: no adenopathy, no carotid bruit, no JVD and supple, symmetrical, trachea midline Lungs: clear to auscultation bilaterally Heart: regular rate and rhythm, S1, S2 normal, no murmur, click, rub or gallop Abdomen: soft, non-tender; bowel sounds normal; no masses,  no organomegaly Extremities: extremities normal, atraumatic, no cyanosis or edema  Lab Results:  Basename 06/07/11 0530 06/06/11 1339  WBC 10.5 10.1  HGB 11.7* 11.2*  PLT 252 272    Basename 06/07/11 0530 06/06/11 1339  NA 125* 122*  K 3.4* 3.4*  CL 85* 84*  CO2 31 26  GLUCOSE 96 130*  BUN 20 19  CREATININE 0.79 0.76   No results found for this basename: TROPONINI:2,CK,MB:2 in the last 72 hours Hepatic Function Panel No results found for this basename: PROT,ALBUMIN,AST,ALT,ALKPHOS,BILITOT,BILIDIR,IBILI in the last 72 hours No results found for this basename: CHOL in the last 72 hours No results found for this basename: PROTIME in the last 72 hours  Imaging:   Cardiac Studies:  Assessment/Plan:  Status post small non-Q-wave myocardial infarction status post PCI to RCA History of silent inferolateral wall MI Ischemic cardiomyopathy Resolving decompensated systolic heart failure Hypertension Diabetes  mellitus Hypercholesteremia Cerebrovascular disease Degenerative joint disease with osteoporosis Peripheral vascular disease Hyponatremia Plan Continue present management I will sign off please call if needed Thank you  LOS: 4 days    Yanelle Sousa N 06/08/2011, 12:33 PM

## 2011-06-08 NOTE — Progress Notes (Signed)
Subjective: Patient continues to improve. She is breathing comfortably, even off oxygen. With ambulation. She does get winded however, her oxygen saturations did not drop. No chest pain  Objective: Weight change: -2.414 kg (-5 lb 5.2 oz)  Intake/Output Summary (Last 24 hours) at 06/08/11 1550 Last data filed at 06/08/11 1300  Gross per 24 hour  Intake    780 ml  Output   3450 ml  Net  -2670 ml   BP 117/72  Pulse 77  Temp(Src) 98.1 F (36.7 C) (Oral)  Resp 20  Ht 5\' 6"  (1.676 m)  Wt 76.386 kg (168 lb 6.4 oz)  BMI 27.18 kg/m2  SpO2 98% General appearance: alert, cooperative, appears stated age, fatigued and no distress Lungs: Clear auscultation bilaterally Heart: Regular rate and rhythm, S1, S2, soft 2/6 systolic ejection murmur Abdomen: soft, non-tender; bowel sounds normal; no masses,  no organomegaly Extremities: Trace pitting edema  Lab Results: Basic Metabolic Panel:  Basename 06/08/11 1201 06/07/11 0530  NA 124* 125*  K 3.3* 3.4*  CL 81* 85*  CO2 31 31  GLUCOSE 107* 96  BUN 28* 20  CREATININE 0.93 0.79  CALCIUM 8.8 8.5  MG -- --  PHOS -- --   CBC:  Basename 06/07/11 0530 06/06/11 1339  WBC 10.5 10.1  NEUTROABS -- 7.8*  HGB 11.7* 11.2*  HCT 33.2* 32.0*  MCV 93.8 93.6  PLT 252 272   BNP:  Basename 06/08/11 0741  POCBNP 2359.0*   Medications: Scheduled Meds:    . aspirin  81 mg Oral Daily  . bivalirudin  0.25 mg/kg Intravenous Once  . calcium-vitamin D  1 tablet Oral Daily  . carvedilol  12.5 mg Oral BID WC  . docusate sodium  100 mg Oral BID  . FLUoxetine  20 mg Oral Daily  . fluticasone  2 spray Each Nare Daily  . furosemide  40 mg Oral BID  . ipratropium  0.5 mg Nebulization Q6H  . losartan  50 mg Oral Daily  . metoprolol  5 mg Intravenous Once  . multivitamins ther. w/minerals  1 tablet Oral Daily  . pantoprazole  40 mg Oral Q1200  . simvastatin  40 mg Oral q1800  . spironolactone  25 mg Oral Daily  . Ticagrelor  90 mg Oral BID  .  vitamin B-12  1,000 mcg Oral 3 times weekly  . DISCONTD: furosemide  40 mg Intravenous Q8H   Continuous Infusions:   PRN Meds:.acetaminophen, albuterol, alum & mag hydroxide-simeth, clonazePAM, HYDROcodone-acetaminophen, ondansetron (ZOFRAN) IV, ondansetron  Assessment/Plan: Patient Active Hospital Problem List: Non-ST elevated MI: Status post catheterization and PCI  Acute systolic congestive heart failure (01/16/2011)  she is near euvolemic. Change Lasix to by mouth. Discontinued. Foley  CAD (coronary artery disease), native coronary artery (06/05/2011)  status post cardiac catheterization with 90% stenosis of the inferior RCA. Status post PCI. Appreciate cardiology help. They have signed off. Awaiting cardiac rehabilitation eval  Hyponatremia (06/05/2011)  in part due to her thiazide diuretics plus CHF. Recheck level in the morning  HYPERTENSION (10/30/2006)  improving.  COPD (10/30/2006)  stable, her issues looked to be more related to CHF.  HYPERLIPIDEMIA (10/30/2006)  currently on statin.  CAROTID ARTERY STENOSIS (07/19/2009)  stable. Noted.  ALLERGIC RHINITIS (12/18/2009)  stable.  GERD (10/30/2006)  on PPI. Stable.  OSTEOARTHRITIS (10/30/2006)  stable  OSTEOPOROSIS (10/30/2006)  stable  Awaiting evaluation by PT and cardiac rehabilitation. Likely will need short-term skilled nursing.   LOS: 4 days   Hollice Espy 06/08/2011,  3:50 PM

## 2011-06-08 NOTE — Progress Notes (Signed)
Physical Therapy Evaluation Patient Details Name: Haley Harris MRN: [id number] DOB: March 12, 1929 Today's Date: 06/08/2011  Problem List:  Patient Active Problem List  Diagnoses  . NEOPLASM, MALIGNANT, BREAST  . HYPERLIPIDEMIA  . DEPRESSION  . HEARING LOSS  . HYPERTENSION  . ATHEROSCLEROTIC CARDIOVASCULAR DISEASE  . CAROTID ARTERY STENOSIS  . PERIPHERAL VASCULAR DISEASE  . ALLERGIC RHINITIS  . COPD  . GERD  . OSTEOARTHRITIS  . OSTEOPOROSIS  . URINARY INCONTINENCE  . BREAST CANCER, HX OF  . COPD exacerbation  . Acute systolic congestive heart failure  . URI (upper respiratory infection)  . CAD (coronary artery disease), native coronary artery  . Hyponatremia    Past Medical History:  Past Medical History  Diagnosis Date  . Allergic rhinitis, cause unspecified   . Unspecified cardiovascular disease   . Personal history of malignant neoplasm of breast   . Occlusion and stenosis of carotid artery without mention of cerebral infarction   . Chronic airway obstruction, not elsewhere classified   . Depressive disorder, not elsewhere classified   . Snoring disorder   . Esophageal reflux   . Unspecified hearing loss   . Other and unspecified hyperlipidemia   . Unspecified essential hypertension   . Malignant neoplasm of breast (female), unspecified site   . Osteoarthrosis, unspecified whether generalized or localized, unspecified site   . Osteoporosis, unspecified   . Peripheral vascular disease, unspecified   . Unspecified urinary incontinence   . Spinal stenosis   . ACE-inhibitor cough   . Angina   . Heart murmur     aS CHILD  . Shortness of breath   . Recurrent upper respiratory infection (URI)   . Anxiety   . Pneumonia   . Neuromuscular disorder     NEROPATHY FROM STENOSIS   Past Surgical History:  Past Surgical History  Procedure Date  . Mastectomy 1987    Right  . Breast reconstruction 1998    Reconstruction   . Reduction mammaplasty 1998    Left  .  Mastoidectomy childhood  . Appendectomy 1948  . Lumbar laminectomy 2003  . Shoulder surgery 02/2005    Bilateral fractures with multiple surgeries  . Breast implant removal 06/12/09    right  . Abdominal hysterectomy   . Fracture surgery     fracture right elbow    PT Assessment/Plan/Recommendation PT Assessment Clinical Impression Statement: pt is an 77 y/o female admitted with acute onset of SOB due to CHF. Pt would benefit from acute PT and rehab at SNF due to weakness, decr activity tolerance decr stability of gait and decr balance. PT Recommendation/Assessment: Patient will need skilled PT in the acute care venue PT Problem List: Decreased strength;Decreased activity tolerance;Decreased balance;Decreased mobility;Decreased coordination Barriers to Discharge: Decreased caregiver support (daughters work days) PT Therapy Diagnosis : Difficulty walking;Generalized weakness PT Plan PT Frequency: Min 3X/week PT Treatment/Interventions: DME instruction;Gait training;Stair training;Functional mobility training;Balance training;Patient/family education PT Recommendation Recommendations for Other Services: OT consult Follow Up Recommendations: Skilled nursing facility Equipment Recommended: Defer to next venue PT Goals  Acute Rehab PT Goals PT Goal Formulation: With patient Time For Goal Achievement: 7 days Pt will go Supine/Side to Sit: with modified independence;with HOB 0 degrees PT Goal: Supine/Side to Sit - Progress: Other (comment) Pt will Transfer Sit to Stand/Stand to Sit: with supervision PT Transfer Goal: Sit to Stand/Stand to Sit - Progress: Other (comment) Pt will Transfer Bed to Chair/Chair to Bed: with supervision PT Transfer Goal: Bed to Chair/Chair to Bed -  Progress: Other (comment) Pt will Ambulate: >150 feet;Other (comment);with least restrictive assistive device (min guard A) PT Goal: Ambulate - Progress: Other (comment)  PT Evaluation Precautions/Restrictions    Precautions Precautions: Fall Required Braces or Orthoses: No Restrictions Weight Bearing Restrictions: No Prior Functioning  Home Living Lives With: Daughter Receives Help From: Family Type of Home: House Home Layout: Two level;Able to live on main level with bedroom/bathroom Alternate Level Stairs-Rails: Left Alternate Level Stairs-Number of Steps: one flight Home Access: Stairs to enter Entrance Stairs-Rails: Left Entrance Stairs-Number of Steps: 2 Bathroom Shower/Tub: Health visitor: Handicapped height Home Adaptive Equipment: Walker - rolling;Straight cane;Shower chair without back Prior Function Level of Independence: Independent with gait;Independent with transfers Able to Take Stairs?: Yes Driving: Yes Cognition Cognition Arousal/Alertness: Awake/alert Overall Cognitive Status: Appears within functional limits for tasks assessed Orientation Level: Oriented X4 Sensation/Coordination Sensation Light Touch:  (pt reports neuropathy both feet) Coordination Gross Motor Movements are Fluid and Coordinated: Yes Extremity Assessment RUE Assessment RUE Assessment: Within Functional Limits LUE Assessment LUE Assessment: Within Functional Limits RLE Assessment RLE Assessment: Within Functional Limits LLE Assessment LLE Assessment: Within Functional Limits (Bil generally weak L weaker than R; generally 3+/5) Mobility (including Balance) Bed Mobility Bed Mobility: Yes Supine to Sit: 5: Supervision;HOB flat Transfers Transfers: Yes Sit to Stand: 4: Min assist;From bed;With upper extremity assist Sit to Stand Details (indicate cue type and reason): vc's for hand placement Ambulation/Gait Ambulation/Gait: Yes Ambulation/Gait Assistance: Other (comment) (min to min guard A, guarded gait) Ambulation Distance (Feet): 130 Feet Assistive device: Rolling walker Gait Pattern: Shuffle (started with step through, ended with shuffle due to fatigue)   Posture/Postural Control Posture/Postural Control: No significant limitations (kyphotic) Exercise    End of Session PT - End of Session Activity Tolerance: Patient limited by fatigue Patient left: in chair;with family/visitor present;with call bell in reach General Behavior During Session: [facility] for tasks performed Cognition: [facility] for tasks performed  [name], Eliseo Gum 06/08/2011, 4:24 PM  06/08/2011  Sharon Bing, PT [phone] (pager)

## 2011-06-08 NOTE — Progress Notes (Signed)
CARDIAC REHAB PHASE I   PRE:  Rate/Rhythm: 82 sr  BP:  Supine:   Sitting: 92/56  Standing:    SaO2: 98% ra  MODE:  Ambulation: 150 ft   POST:  Rate/Rhythem: 86   BP:  Supine:   Sitting: 126/60  Standing:    SaO2: 99% ra  1325 - 1415 Pt ambulated with walker and 2 assit, fairly steady. Tires easily. Back to bed with call light in reach.   Rosalie Doctor

## 2011-06-09 DIAGNOSIS — I214 Non-ST elevation (NSTEMI) myocardial infarction: Secondary | ICD-10-CM | POA: Diagnosis not present

## 2011-06-09 LAB — BASIC METABOLIC PANEL
Chloride: 82 mEq/L — ABNORMAL LOW (ref 96–112)
GFR calc Af Amer: 62 mL/min — ABNORMAL LOW (ref >90)
Potassium: 3.9 mEq/L (ref 3.5–5.1)

## 2011-06-09 LAB — SODIUM, URINE, RANDOM: Sodium, Ur: 11 mEq/L

## 2011-06-09 MED ORDER — CARVEDILOL 12.5 MG PO TABS: 12.5000 mg | ORAL_TABLET | Freq: Two times a day (BID) | ORAL | Status: DC

## 2011-06-09 MED ORDER — TICAGRELOR 90 MG PO TABS: 90.0000 mg | ORAL_TABLET | Freq: Two times a day (BID) | ORAL | Status: DC

## 2011-06-09 MED ORDER — FUROSEMIDE 40 MG PO TABS
40.0000 mg | ORAL_TABLET | Freq: Every day | ORAL | Status: DC
  Administered 2011-06-10: 40 mg via ORAL
  Filled 2011-06-09: qty 1

## 2011-06-09 MED ORDER — FUROSEMIDE 40 MG PO TABS: 40.0000 mg | ORAL_TABLET | Freq: Every day | ORAL | Status: DC

## 2011-06-09 MED ORDER — SPIRONOLACTONE 25 MG PO TABS: 25.0000 mg | ORAL_TABLET | Freq: Every day | ORAL | Status: DC

## 2011-06-09 MED ORDER — LOSARTAN POTASSIUM 50 MG PO TABS: 50.0000 mg | ORAL_TABLET | Freq: Every day | ORAL | Status: DC

## 2011-06-09 NOTE — Discharge Summary (Addendum)
DISCHARGE SUMMARY  Haley Harris  MR#: [id number]  DOB:1928/07/22  Date of Admission: 06/04/2011 Date of Discharge: 06/09/2011  Attending Physician:KRISHNAN,SENDIL K  Patient's Haley Alphonsus Sias, MD, MD  Consults:  Dr. Sharyn Lull, cardiology  Discharge Diagnoses: Non-Q wave MI Present on Admission:  .HYPERLIPIDEMIA .HYPERTENSION .CAROTID ARTERY STENOSIS .ALLERGIC RHINITIS .COPD .GERD .OSTEOARTHRITIS .OSTEOPOROSIS .Acute systolic congestive heart failure .CAD (coronary artery disease), native coronary artery .Hyponatremia    Current Discharge Medication List    START taking these medications   Details  carvedilol (COREG) 12.5 MG tablet Take 1 tablet (12.5 mg total) by mouth 2 (two) times daily with a meal. Qty: 60 tablet, Refills: 0    furosemide (LASIX) 40 MG tablet Take 1 tablet (40 mg total) by mouth daily. Qty: 30 tablet, Refills: 0    losartan (COZAAR) 50 MG tablet Take 1 tablet (50 mg total) by mouth daily. Qty: 30 tablet, Refills: 0    spironolactone (ALDACTONE) 25 MG tablet Take 1 tablet (25 mg total) by mouth daily. Qty: 30 tablet, Refills: 0    Ticagrelor (BRILINTA) 90 MG TABS tablet Take 1 tablet (90 mg total) by mouth 2 (two) times daily. Qty: 60 tablet, Refills: 0      CONTINUE these medications which have NOT CHANGED   Details  albuterol (PROAIR HFA) 108 (90 BASE) MCG/ACT inhaler Inhale 2 puffs into the lungs every 6 (six) hours as needed for wheezing. Qty: 1 Inhaler, Refills: 1    albuterol (PROVENTIL) (2.5 MG/3ML) 0.083% nebulizer solution Take 2.5 mg by nebulization once.      aspirin 81 MG tablet Take 81 mg by mouth daily.      Calcium Carbonate-Vitamin D (CALTRATE 600+D PO) Take 1 tablet by mouth daily.      cetirizine (ZYRTEC) 10 MG tablet Take 10 mg by mouth 2 (two) times daily as needed.      clonazePAM (KLONOPIN) 0.5 MG tablet Take 0.5 mg by mouth 2 (two) times daily as needed.      FLUoxetine (PROZAC) 20 MG capsule Take 1 capsule  (20 mg total) by mouth daily. Qty: 90 capsule, Refills: 3    fluticasone (FLONASE) 50 MCG/ACT nasal spray Place 2 sprays into the nose daily. Qty: 16 g, Refills: 11    HYDROcodone-acetaminophen (LORTAB) 10-500 MG per tablet Take 1 tablet by mouth every 6 (six) hours as needed. pain     ipratropium (ATROVENT) 0.02 % nebulizer solution Take 500 mcg by nebulization once.      montelukast (SINGULAIR) 10 MG tablet Take 10 mg by mouth daily as needed. For severe resistant allergy symptoms      Multiple Vitamin (MULTIVITAMIN) tablet Take 1 tablet by mouth daily.      omeprazole (PRILOSEC) 20 MG capsule Take 1 capsule (20 mg total) by mouth 2 (two) times daily. Qty: 90 capsule, Refills: 3    oxyCODONE-acetaminophen (PERCOCET) 5-325 MG per tablet Take 2 tablets by mouth every 4 (four) hours as needed for pain. Qty: 15 tablet, Refills: 0    pravastatin (PRAVACHOL) 80 MG tablet Take 1 tablet (80 mg total) by mouth at bedtime. Qty: 90 tablet, Refills: 3    vitamin B-12 (CYANOCOBALAMIN) 1000 MCG tablet Take 1,000 mcg by mouth 3 (three) times a week.        STOP taking these medications     bisoprolol-hydrochlorothiazide (ZIAC) 5-6.25 MG per tablet      triamterene-hydrochlorothiazide (MAXZIDE-25) 37.5-25 MG per tablet           Hospital Course: Non-Q-wave  MI: Patient is an 77 year old white female past medical history of hypertension & severe osteoporosis who presented to the emergency room on 11/22 with shortness of breath. She was noted to have a minimally elevated white blood cell count and chest x-ray showed signs suggestive of pneumonia. It was noted the patient did not have a fever. She had a previous history of any heart issues although was noted she has some cardiomegaly on x-ray. Initially antibiotics were started the patient was admitted to the hospitalist service. On admission I ordered a BNP and cardiac markers. BNP came back elevated at 4000, of which then I started diuretics  and ordered an echocardiogram. Initial set of cardiac markers noted a normal troponin, but then second set greatly increased troponin to 1.39. Dr. Sharyn Lull, who is on-call for him assigned cardiology, was consulted and after evaluation, plan to take the patient for cardiac catheterization the following day.  Patient was noted by echocardiogram to have a low ejection fraction of 25%.  During cardiac catheterization patient was found to have 90% stenosis of the distal RCA and she underwent PCI. Was also noted that she had a silent anterolateral MI in the past. She felt to have ischemic cardiomyopathy which accounted for her severely decreased ejection fraction. Post cardiac catheterization she was stabilized continued to do better and her medications were optimized. She will need to be on the Brintilla at least for the next 9-12 months, as per cardiology. She was already on a daily aspirin.  Present on Admission:  .HYPERLIPIDEMIA: She will continue on statin  .HYPERTENSION: Her blood pressure medications were adjusted. Size I diuretics were discontinued because of hyponatremia. She's currently on Aldactone, Lasix, Coreg and Cozaar. (She previous he was listed have an allergy to ARB's, however this is incorrect it she's been able to tolerate Cozaar for the past 4 days.) Blood pressures have remained stable.   Marland KitchenCAROTID ARTERY STENOSIS: Her stable during this hospitalization. She will continue on aspirin.  Marland KitchenALLERGIC RHINITIS: Stable during this hospitalization.   Marland KitchenCOPD: Stable during his hospitalization.  Marland KitchenGERD: Continued on PPI.  Marland KitchenOSTEOARTHRITIS: Stable during his hospitalization.  .OSTEOPOROSIS: Stable during this hospitalization. Because of her deconditioning, she is willing to go to skilled nursing facility to prevent any falls after she is discharged home.  .Acute systolic congestive heart failure: As above, echo and cardiac catheterization noted severely decreased ejection fraction felt to be  secondary to ischemic cardiomyopathy. She has diuresed well and now on Lasix 40 mg plus mild low dose Aldactone. I've discontinued her home and thiazide diuretics as is continue to treat her hyponatremia.  By hospital day 2, it was apparent that her issues are entirely cardiac. She had no fever. Her white blood cell count was minimally elevated secondary to stress margination from her non-Q-wave MI and systolic congestive heart failure. Antibiotics were discontinued. By 11/25, patient was able to be weaned completely off oxygen.  Marland KitchenCAD (coronary artery disease), native coronary artery3  .Hyponatremia:Patient on admission was noted to have a low sodium in the low 120s. I initially suspected this was slowly because of thiazide diuretics as she is on 2 different ones. Following her discovery of congestive heart failure, so likely it is also interesting factor. Patient's sodium has been up and down during his hospitalization. She was found by lab studies to have a low serum and urine osmolarity. I am waiting for a serum cortisol level, serum TSH and a urine sodium levels for further plans on treating her hyponatremia. This issue will  be updated prior to her discharge.    Day of Discharge BP 103/68  Pulse 59  Temp(Src) 97.6 F (36.4 C) (Oral)  Resp 19  Ht 5\' 6"  (1.676 m)  Wt 76.386 kg (168 lb 6.4 oz)  BMI 27.18 kg/m2  SpO2 97%  Physical Exam: General: Alert and oriented x3, in no apparent distress Cardiovascular: Regular rate and rhythm S1-S2, soft 2/6 systolic ejection murmur Lungs:" Bilaterally Abdomen: Soft, nontender, nondistended, positive bowel sounds-normoactive Extremities: No clubbing cyanosis trace pitting edema at best.   Results for orders placed during the hospital encounter of 06/04/11 (from the past 24 hour(s))  BASIC METABOLIC PANEL     Status: Abnormal   Collection Time   06/09/11  5:16 AM      Component Value Range   Sodium 122 (*) 135 - 145 (mEq/L)   Potassium 3.9  3.5 -  5.1 (mEq/L)   Chloride 82 (*) 96 - 112 (mEq/L)   CO2 29  19 - 32 (mEq/L)   Glucose, Bld 99  70 - 99 (mg/dL)   BUN 34 (*) 6 - 23 (mg/dL)   Creatinine, Ser 2.95  0.50 - 1.10 (mg/dL)   Calcium 8.7  8.4 - 62.1 (mg/dL)   GFR calc non Af Amer 54 (*) >90 (mL/min)   GFR calc Af Amer 62 (*) >90 (mL/min)    Disposition: Improved, for short-term skilled nursing facility prior to discharge home.    Follow-up Appts: Discharge Orders    Future Appointments: Provider: Department: Dept Phone: Center:   06/29/2011 10:30 AM Varney Baas, MD [facility] [phone] [id number]     Future Orders Please Complete By Expires   Diet - low sodium heart healthy      Increase activity slowly      Walk with assistance         Follow-up with Dr.Harwani,  cardiology in  2  Weeks. She will follow up with Dr. Alphonsus Harris, PCP in 3-4 weeks    Tests Needing Follow-up: None   Signed: KRISHNAN,SENDIL K 06/09/2011, 7:07 PM  Today 06/10/11 patient was seen and examined during morning rounds. She was found to be alert and oriented times 3, with clear chest and regular heart beat. She was asymptomatic in good spirits. She politely requested to be released for rehab today. i have noted her sodium level which is currently at 127. It is not causing any symptoms. I have discussed with the patient some free water restriction and to follow up with labs at SNF. Her PM cortisol level of 4 is WNL. I doubt she has symptomatic addison due to her clinical status.  Time spent in discharging th patient 35 minutes

## 2011-06-09 NOTE — Progress Notes (Signed)
CARDIAC REHAB PHASE I   PRE:  Rate/Rhythm: 98  BP:  Supine:   Sitting: 110/72  Standing:    SaO2:   MODE:  Ambulation: 250 ft   POST:  Rate/Rhythem: 108  BP:  Supine:   Sitting: 136/64  Standing:    SaO2: 99 % RA (ear)  0810 - 0835 Pt ambulated with walker and 2 assist. More steady today. Sates feeling stronger today. Back to chair with call bell in reach. Family in with.  Rosalie Doctor

## 2011-06-09 NOTE — Progress Notes (Signed)
Clinical Social Work, 06/09/11, 1400:  Please see chart for full assessment.  CSW faxed out SNF search and will follow up with bed offers.Jaryah Aracena, [place], [place], [phone]

## 2011-06-09 NOTE — Progress Notes (Signed)
Physical Therapy Treatment Patient Details Name: TORIAN THOENNES MRN: [id number] DOB: [birth year] Today's Date: 06/09/2011  PT Assessment/Plan  PT - Assessment/Plan Comments on Treatment Session: Patient is s/p MI with difficulty with mobility secondary to weakness and decreased endurance.  Needs continued PT to address balance and strength issues.   PT Plan: Discharge plan remains appropriate;Frequency remains appropriate PT Frequency: Min 3X/week Follow Up Recommendations: Skilled nursing facility;24 hour supervision/assistance Equipment Recommended: Defer to next venue PT Goals  Acute Rehab PT Goals PT Goal Formulation: With patient Time For Goal Achievement: 7 days PT Goal: Supine/Side to Sit - Progress: Other (comment) PT Transfer Goal: Sit to Stand/Stand to Sit - Progress: Met PT Transfer Goal: Bed to Chair/Chair to Bed - Progress: Other (comment) PT Goal: Ambulate - Progress: Progressing toward goal  PT Treatment Precautions/Restrictions  Precautions Precautions: Fall Required Braces or Orthoses: No Restrictions Weight Bearing Restrictions: No RLE Weight Bearing: Non weight bearing (r/t cath) Mobility (including Balance) Bed Mobility Supine to Sit: Not tested (comment) Transfers Sit to Stand: 5: Supervision;From chair/3-in-1;With armrests;With upper extremity assist Ambulation/Gait Ambulation/Gait Assistance: 4: Min assist (guard assist for safety) Ambulation/Gait Assistance Details (indicate cue type and reason): Cues needed to stay close to RW.  Pt. takes shorter steps but family comments that step length is improved from yesterday. Ambulation Distance (Feet): 150 Feet Assistive device: Rolling walker Gait Pattern: Step-through pattern;Decreased stride length;Trunk flexed Stairs: No Wheelchair Mobility Wheelchair Mobility: No  Posture/Postural Control Posture/Postural Control: No significant limitations Balance Balance Assessed: Yes Dynamic Standing  Balance Dynamic Standing - Balance Support: Bilateral upper extremity supported Dynamic Standing - Level of Assistance: 4: Min assist Dynamic Standing - Balance Activities: Lateral lean/weight shifting;Reaching for objects Exercise  General Exercises - Lower Extremity Long Arc Quad: Strengthening;Both;10 reps;Seated Hip Flexion/Marching: Strengthening;Both;10 reps;Seated End of Session PT - End of Session Equipment Utilized During Treatment: Gait belt Activity Tolerance: Patient tolerated treatment well Patient left: in chair;with call bell in reach;with family/visitor present Nurse Communication: Mobility status for ambulation General Behavior During Session: [facility] for tasks performed Cognition: [facility] for tasks performed  INGOLD,Aubreanna Percle 06/09/2011, 9:32 AM [facility] [phone] (pager)

## 2011-06-10 LAB — CORTISOL: Cortisol, Plasma: 4 ug/dL

## 2011-06-10 LAB — BASIC METABOLIC PANEL
Calcium: 8.9 mg/dL (ref 8.4–10.5)
GFR calc non Af Amer: 62 mL/min — ABNORMAL LOW (ref >90)
Sodium: 127 mEq/L — ABNORMAL LOW (ref 135–145)

## 2011-06-10 NOTE — Progress Notes (Signed)
CARDIAC REHAB PHASE I   PRE:  Rate/Rhythm: 89 SR    BP: sitting 110/62    SaO2: 98 RA  MODE:  Ambulation: 450 ft   POST:  Rate/Rhythm: 93 SR    BP: to bathroom     SaO2:   Tolerated well assist x1 with RW.  Good endurance today.  Encouraged longer stride and pt able to do.  Will fu.  [phone]  Harriet Masson CES, ACSM

## 2011-06-10 NOTE — Progress Notes (Signed)
Clinical Social Work, 06/10/11, 1230:  Patient will be discharged to [facility].  CSW communicated plans with pt and family who are agreeable to plan.  CSW faxed discharge paperwork to facility and will arrange ambulance transportation.Nailyn Dearinger, [place], [place] #[phone]

## 2011-06-10 NOTE — Progress Notes (Signed)
06/09/11 10:45 Letha Cape RN, BSN [phone] Patient lives with daughter, she will not have 24 hr care at home so patient and daughter agrees that she will need snf at dc.   CSW aware and following.

## 2011-06-10 NOTE — Progress Notes (Signed)
Ed completed with pt and family. Requests her name [facility] to [facility]. [phone]  Ethelda Chick CES, ACSM

## 2011-06-11 ENCOUNTER — Other Ambulatory Visit: Payer: Self-pay | Admitting: *Deleted

## 2011-06-11 NOTE — Telephone Encounter (Signed)
Pt d/c for rehab, needs hard scripts [phone]

## 2011-06-12 MED ORDER — CLONAZEPAM 0.5 MG PO TABS: 0.5000 mg | ORAL_TABLET | Freq: Two times a day (BID) | ORAL | Status: DC | PRN

## 2011-06-12 MED ORDER — OXYCODONE-ACETAMINOPHEN 5-325 MG PO TABS: 1.0000 | ORAL_TABLET | ORAL | Status: AC | PRN

## 2011-06-12 NOTE — Telephone Encounter (Signed)
Noted I thought the message left meant that she was leaving the facility already

## 2011-06-12 NOTE — Telephone Encounter (Signed)
Spoke with Lanice Shirts and she stated that patient looks good and she doubts that she will be there in there facility long.  Patient has to follow up PCP within 30 of discharge from facility.  Rosey Bath stated that patient doesn't need the Rx's because there medical director can write for the Rx's that she needs.  She asked that we just hold the Rx's here and they will call us if they need them.

## 2011-06-15 NOTE — Telephone Encounter (Signed)
rx's put up front to be held.

## 2011-06-17 ENCOUNTER — Ambulatory Visit: Payer: 59 | Admitting: Psychology

## 2011-06-24 ENCOUNTER — Ambulatory Visit: Payer: 59 | Admitting: Psychology

## 2011-06-29 ENCOUNTER — Encounter: Payer: Self-pay | Admitting: Internal Medicine

## 2011-06-29 ENCOUNTER — Ambulatory Visit (INDEPENDENT_AMBULATORY_CARE_PROVIDER_SITE_OTHER): Payer: Medicare Other | Admitting: Internal Medicine

## 2011-06-29 VITALS — BP 128/60 | HR 73 | Temp 97.8°F | Ht 66.0 in | Wt 173.0 lb

## 2011-06-29 DIAGNOSIS — M1990 Unspecified osteoarthritis, unspecified site: Secondary | ICD-10-CM

## 2011-06-29 DIAGNOSIS — I214 Non-ST elevation (NSTEMI) myocardial infarction: Secondary | ICD-10-CM

## 2011-06-29 DIAGNOSIS — J449 Chronic obstructive pulmonary disease, unspecified: Secondary | ICD-10-CM

## 2011-06-29 DIAGNOSIS — I5021 Acute systolic (congestive) heart failure: Secondary | ICD-10-CM

## 2011-06-29 DIAGNOSIS — J4489 Other specified chronic obstructive pulmonary disease: Secondary | ICD-10-CM

## 2011-06-29 DIAGNOSIS — I509 Heart failure, unspecified: Secondary | ICD-10-CM

## 2011-06-29 LAB — BASIC METABOLIC PANEL
BUN: 19 mg/dL (ref 6–23)
Creatinine, Ser: 1 mg/dL (ref 0.4–1.2)
GFR: 60.12 mL/min (ref >60.00)

## 2011-06-29 NOTE — Assessment & Plan Note (Signed)
Will try the lower hydrocodone dose Can't take NSAIDs

## 2011-06-29 NOTE — Progress Notes (Signed)
Subjective:    Patient ID: Haley Harris, female    DOB: 05/31/1929, 77 y.o.   MRN: [id number]  HPI Here with daughter Hospitalized last month Acute Non Q wave MI Had angioplasty by Dr Sharyn Lull  Still getting therapy Easy dyspnea with exertion Had her formal rehab at [facility] for a little over a week  Daughter and husband live with her (though they go to work) Has other family in the area for support  No chest pain No palpitations Some edema--limited in salt and liquids DOE is fairly stable Echo planned in follow up before cardiology reeval ~ March  Off NSAIDs due to heart problems Now on hydrocodone but doesn't like it  Current Outpatient Prescriptions on File Prior to Visit  Medication Sig Dispense Refill  . albuterol (PROAIR HFA) 108 (90 BASE) MCG/ACT inhaler Inhale 2 puffs into the lungs every 6 (six) hours as needed for wheezing.  1 Inhaler  1  . albuterol (PROVENTIL) (2.5 MG/3ML) 0.083% nebulizer solution Take 2.5 mg by nebulization once.        Marland Kitchen aspirin 81 MG tablet Take 81 mg by mouth daily.        . Calcium Carbonate-Vitamin D (CALTRATE 600+D PO) Take 1 tablet by mouth daily.        . carvedilol (COREG) 12.5 MG tablet Take 1 tablet (12.5 mg total) by mouth 2 (two) times daily with a meal.  60 tablet  0  . cetirizine (ZYRTEC) 10 MG tablet Take 10 mg by mouth 2 (two) times daily as needed.        . clonazePAM (KLONOPIN) 0.5 MG tablet Take 1 tablet (0.5 mg total) by mouth 2 (two) times daily as needed.  60 tablet  0  . FLUoxetine (PROZAC) 20 MG capsule Take 1 capsule (20 mg total) by mouth daily.  90 capsule  3  . fluticasone (FLONASE) 50 MCG/ACT nasal spray Place 2 sprays into the nose daily.  16 g  11  . furosemide (LASIX) 40 MG tablet Take 1 tablet (40 mg total) by mouth daily.  30 tablet  0  . HYDROcodone-acetaminophen (LORTAB) 10-500 MG per tablet Take 1 tablet by mouth every 6 (six) hours as needed. pain       . ipratropium (ATROVENT) 0.02 % nebulizer solution  Take 500 mcg by nebulization once.        Marland Kitchen losartan (COZAAR) 50 MG tablet Take 1 tablet (50 mg total) by mouth daily.  30 tablet  0  . montelukast (SINGULAIR) 10 MG tablet Take 10 mg by mouth daily as needed. For severe resistant allergy symptoms        . Multiple Vitamin (MULTIVITAMIN) tablet Take 1 tablet by mouth daily.        Marland Kitchen omeprazole (PRILOSEC) 20 MG capsule Take 1 capsule (20 mg total) by mouth 2 (two) times daily.  90 capsule  3  . pravastatin (PRAVACHOL) 80 MG tablet Take 1 tablet (80 mg total) by mouth at bedtime.  90 tablet  3  . spironolactone (ALDACTONE) 25 MG tablet Take 1 tablet (25 mg total) by mouth daily.  30 tablet  0  . Ticagrelor (BRILINTA) 90 MG TABS tablet Take 1 tablet (90 mg total) by mouth 2 (two) times daily.  60 tablet  0  . vitamin B-12 (CYANOCOBALAMIN) 1000 MCG tablet Take 1,000 mcg by mouth 3 (three) times a week.          Allergies  Allergen Reactions  . Augmentin Anaphylaxis  .  Amoxicillin     REACTION: Angioedema requiring intubation  . Cephalexin   . Clarithromycin   . Nifedipine   . Olmesartan Medoxomil     REACTION: cough  . Tramadol Hcl     Past Medical History  Diagnosis Date  . Allergic rhinitis, cause unspecified   . Unspecified cardiovascular disease   . Personal history of malignant neoplasm of breast   . Occlusion and stenosis of carotid artery without mention of cerebral infarction   . Chronic airway obstruction, not elsewhere classified   . Depressive disorder, not elsewhere classified   . Snoring disorder   . Esophageal reflux   . Unspecified hearing loss   . Other and unspecified hyperlipidemia   . Unspecified essential hypertension   . Malignant neoplasm of breast (female), unspecified site   . Osteoarthrosis, unspecified whether generalized or localized, unspecified site   . Osteoporosis, unspecified   . Peripheral vascular disease, unspecified   . Unspecified urinary incontinence   . Spinal stenosis   . ACE-inhibitor  cough   . Angina   . Heart murmur     aS CHILD  . Shortness of breath   . Recurrent upper respiratory infection (URI)   . Anxiety   . Pneumonia   . Neuromuscular disorder     NEROPATHY FROM STENOSIS    Past Surgical History  Procedure Date  . Mastectomy 1987    Right  . Breast reconstruction 1998    Reconstruction   . Reduction mammaplasty 1998    Left  . Mastoidectomy childhood  . Appendectomy 1948  . Lumbar laminectomy 2003  . Shoulder surgery 02/2005    Bilateral fractures with multiple surgeries  . Breast implant removal 06/12/09    right  . Abdominal hysterectomy   . Fracture surgery     fracture right elbow  . Coronary angioplasty 11/12    distal RCA    Family History  Problem Relation Age of Onset  . Heart attack Father 14  . Cancer Mother     ovarian  . Cancer Brother     lung  . Arthritis      family    History   Social History  . Marital Status: Widowed    Spouse Name: N/A    Number of Children: 2  . Years of Education: N/A   Occupational History  . 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     Social History Main Topics  . Smoking status: Former Smoker    Quit date: 07/13/1974  . Smokeless tobacco: Never Used  . Alcohol Use: Yes     occasional  . Drug Use: No  . Sexually Active: No   Other Topics Concern  . Not on file   Social History Narrative  . No narrative on file   Review of Systems Not driving for now Appetite is okay but small Gets sense of food getting stuck in throat but swallows okay    Objective:   Physical Exam  Constitutional: She appears well-developed and well-nourished. No distress.  Neck: Normal range of motion. Neck supple.  Cardiovascular: Normal rate, regular rhythm and normal heart sounds.  Exam reveals no gallop.   No murmur heard. Pulmonary/Chest: Effort normal and breath sounds normal. No respiratory distress. She has no wheezes. She has no rales.  Musculoskeletal: She exhibits no edema and no  tenderness.       Calves thick but no pitting  Lymphadenopathy:    She has no  cervical adenopathy.  Psychiatric: She has a normal mood and affect. Her behavior is normal. Judgment and thought content normal.          Assessment & Plan:

## 2011-06-29 NOTE — Assessment & Plan Note (Signed)
Had stent put in  By Dr Sharyn Lull On tigrelor for a year or so

## 2011-06-29 NOTE — Assessment & Plan Note (Signed)
Seems to be stable No changes

## 2011-06-29 NOTE — Assessment & Plan Note (Signed)
Now improved NYHA class 2 symptoms Will get repeat echo before cardiology eval On approp meds

## 2011-07-06 ENCOUNTER — Encounter: Payer: Self-pay | Admitting: *Deleted

## 2011-07-21 ENCOUNTER — Encounter (HOSPITAL_COMMUNITY): Payer: Self-pay | Admitting: Emergency Medicine

## 2011-07-21 ENCOUNTER — Emergency Department (HOSPITAL_COMMUNITY): Payer: Medicare Other

## 2011-07-21 ENCOUNTER — Emergency Department (HOSPITAL_COMMUNITY)
Admission: EM | Admit: 2011-07-21 | Discharge: 2011-07-21 | Disposition: A | Discharge status: Home / Self Care | Payer: Medicare Other | Attending: Emergency Medicine | Admitting: Emergency Medicine

## 2011-07-21 DIAGNOSIS — I10 Essential (primary) hypertension: Secondary | ICD-10-CM | POA: Insufficient documentation

## 2011-07-21 DIAGNOSIS — J4489 Other specified chronic obstructive pulmonary disease: Secondary | ICD-10-CM | POA: Insufficient documentation

## 2011-07-21 DIAGNOSIS — S42213A Unspecified displaced fracture of surgical neck of unspecified humerus, initial encounter for closed fracture: Secondary | ICD-10-CM | POA: Insufficient documentation

## 2011-07-21 DIAGNOSIS — S0990XA Unspecified injury of head, initial encounter: Secondary | ICD-10-CM | POA: Insufficient documentation

## 2011-07-21 DIAGNOSIS — Z7982 Long term (current) use of aspirin: Secondary | ICD-10-CM | POA: Insufficient documentation

## 2011-07-21 DIAGNOSIS — F341 Dysthymic disorder: Secondary | ICD-10-CM | POA: Insufficient documentation

## 2011-07-21 DIAGNOSIS — I739 Peripheral vascular disease, unspecified: Secondary | ICD-10-CM | POA: Insufficient documentation

## 2011-07-21 DIAGNOSIS — R0789 Other chest pain: Secondary | ICD-10-CM | POA: Insufficient documentation

## 2011-07-21 DIAGNOSIS — Z79899 Other long term (current) drug therapy: Secondary | ICD-10-CM | POA: Insufficient documentation

## 2011-07-21 DIAGNOSIS — I219 Acute myocardial infarction, unspecified: Secondary | ICD-10-CM | POA: Insufficient documentation

## 2011-07-21 DIAGNOSIS — R221 Localized swelling, mass and lump, neck: Secondary | ICD-10-CM | POA: Insufficient documentation

## 2011-07-21 DIAGNOSIS — M810 Age-related osteoporosis without current pathological fracture: Secondary | ICD-10-CM | POA: Insufficient documentation

## 2011-07-21 DIAGNOSIS — I252 Old myocardial infarction: Secondary | ICD-10-CM | POA: Insufficient documentation

## 2011-07-21 DIAGNOSIS — R220 Localized swelling, mass and lump, head: Secondary | ICD-10-CM | POA: Insufficient documentation

## 2011-07-21 DIAGNOSIS — R51 Headache: Secondary | ICD-10-CM | POA: Insufficient documentation

## 2011-07-21 DIAGNOSIS — E785 Hyperlipidemia, unspecified: Secondary | ICD-10-CM | POA: Insufficient documentation

## 2011-07-21 DIAGNOSIS — E871 Hypo-osmolality and hyponatremia: Secondary | ICD-10-CM | POA: Insufficient documentation

## 2011-07-21 DIAGNOSIS — S42209A Unspecified fracture of upper end of unspecified humerus, initial encounter for closed fracture: Secondary | ICD-10-CM

## 2011-07-21 DIAGNOSIS — K219 Gastro-esophageal reflux disease without esophagitis: Secondary | ICD-10-CM | POA: Insufficient documentation

## 2011-07-21 DIAGNOSIS — N390 Urinary tract infection, site not specified: Secondary | ICD-10-CM

## 2011-07-21 DIAGNOSIS — M1990 Unspecified osteoarthritis, unspecified site: Secondary | ICD-10-CM | POA: Insufficient documentation

## 2011-07-21 DIAGNOSIS — Z853 Personal history of malignant neoplasm of breast: Secondary | ICD-10-CM | POA: Insufficient documentation

## 2011-07-21 DIAGNOSIS — S0510XA Contusion of eyeball and orbital tissues, unspecified eye, initial encounter: Secondary | ICD-10-CM | POA: Insufficient documentation

## 2011-07-21 DIAGNOSIS — M25519 Pain in unspecified shoulder: Secondary | ICD-10-CM | POA: Insufficient documentation

## 2011-07-21 DIAGNOSIS — J449 Chronic obstructive pulmonary disease, unspecified: Secondary | ICD-10-CM | POA: Insufficient documentation

## 2011-07-21 DIAGNOSIS — W010XXA Fall on same level from slipping, tripping and stumbling without subsequent striking against object, initial encounter: Secondary | ICD-10-CM | POA: Insufficient documentation

## 2011-07-21 HISTORY — DX: Acute myocardial infarction, unspecified: I21.9

## 2011-07-21 LAB — COMPREHENSIVE METABOLIC PANEL
ALT: 16 U/L (ref 0–35)
AST: 17 U/L (ref 0–37)
Calcium: 8.7 mg/dL (ref 8.4–10.5)
GFR calc Af Amer: >90 mL/min (ref >90)
Sodium: 129 mEq/L — ABNORMAL LOW (ref 135–145)
Total Protein: 6.4 g/dL (ref 6.0–8.3)

## 2011-07-21 LAB — URINALYSIS, MICROSCOPIC ONLY
Glucose, UA: NEGATIVE mg/dL
Hgb urine dipstick: NEGATIVE
Protein, ur: NEGATIVE mg/dL
pH: 7 (ref 5.0–8.0)

## 2011-07-21 LAB — CBC
HCT: 30.7 % — ABNORMAL LOW (ref 36.0–46.0)
Hemoglobin: 10.5 g/dL — ABNORMAL LOW (ref 12.0–15.0)
MCH: 32.7 pg (ref 26.0–34.0)
MCHC: 34.2 g/dL (ref 30.0–36.0)
RBC: 3.21 MIL/uL — ABNORMAL LOW (ref 3.87–5.11)

## 2011-07-21 LAB — SAMPLE TO BLOOD BANK

## 2011-07-21 LAB — APTT: aPTT: 29 seconds (ref 24–37)

## 2011-07-21 LAB — PROTIME-INR: Prothrombin Time: 14.1 seconds (ref 11.6–15.2)

## 2011-07-21 MED ORDER — FENTANYL CITRATE 0.05 MG/ML IJ SOLN
INTRAMUSCULAR | Status: AC
  Administered 2011-07-21: 100 ug
  Filled 2011-07-21: qty 2

## 2011-07-21 MED ORDER — CIPROFLOXACIN IN D5W 400 MG/200ML IV SOLN
400.0000 mg | Freq: Once | INTRAVENOUS | Status: AC
  Administered 2011-07-21: 400 mg via INTRAVENOUS
  Filled 2011-07-21: qty 200

## 2011-07-21 MED ORDER — FENTANYL CITRATE 0.05 MG/ML IJ SOLN
INTRAMUSCULAR | Status: AC
  Administered 2011-07-21: 50 ug
  Filled 2011-07-21: qty 2

## 2011-07-21 MED ORDER — OXYCODONE-ACETAMINOPHEN 5-325 MG PO TABS: 2.0000 | ORAL_TABLET | ORAL | Status: AC | PRN

## 2011-07-21 MED ORDER — SODIUM CHLORIDE 0.9 % IV BOLUS (SEPSIS)
500.0000 mL | Freq: Once | INTRAVENOUS | Status: AC
  Administered 2011-07-21: 500 mL via INTRAVENOUS

## 2011-07-21 MED ORDER — CIPROFLOXACIN HCL 250 MG PO TABS: 250.0000 mg | ORAL_TABLET | Freq: Two times a day (BID) | ORAL | Status: AC

## 2011-07-21 NOTE — ED Notes (Signed)
Ortho called for splint application. 

## 2011-07-21 NOTE — Progress Notes (Signed)
Orthopedic Tech Progress Note Patient Details:  Haley Harris March 05, 1929 [id number]  Type of Splint: Coapt;Other (comment) (foam arm splint) Splint Location: right arm Splint Interventions: Application    Rachelann Enloe 07/21/2011, 3:55 PM

## 2011-07-21 NOTE — ED Notes (Signed)
Gave old and new ECG to Dr. Ranae Palms after I performed. 1:08pm JG

## 2011-07-21 NOTE — ED Notes (Signed)
Pt ambulated in room with assistance. tol well. OOB sitting in chair.

## 2011-07-21 NOTE — ED Notes (Signed)
Pt's family requesting patient's admission to hospital overnight to allow time for family to arrange for home care needs. MD notified.

## 2011-07-21 NOTE — ED Notes (Signed)
Family at bedside. 

## 2011-07-21 NOTE — ED Notes (Signed)
MD at bedside. 

## 2011-07-21 NOTE — ED Notes (Signed)
C-collar removed by MD

## 2011-07-21 NOTE — ED Notes (Signed)
EMS, pt was walking with a walker and tripped over wheel, falling forward and striking head and right shoulder, carpeted floor with some hardwood area where she fell .No LOC. Obvious deformity to right shoulder, hematoma above right eye. Hx right shoulder dislocation. Pt was at home, lives with family.

## 2011-07-21 NOTE — ED Provider Notes (Signed)
History     CSN: [id number]  Arrival date & time 07/21/11  1228   First MD Initiated Contact with Patient 07/21/11 1234      Chief Complaint  Patient presents with  . Fall    (Consider location/radiation/quality/duration/timing/severity/associated sxs/prior treatment) Patient is a 78 y.o. female presenting with fall. The history is provided by the patient.  Fall The accident occurred less than 1 hour ago. The fall occurred while walking. Distance fallen: standing. She landed on carpet. The point of impact was the head and right shoulder. The pain is present in the head and right shoulder. The pain is moderate. Pertinent negatives include no fever, no numbness, no abdominal pain, no headaches and no loss of consciousness.   Pt was walking with walker and had a trip and fall striking the R side of her head and R shoulder. No LOC. Denies neck pain. EMS gave 150 mcg of fentanyl en route and pt pain is improved. R shoulder deformity noted.   Past Medical History  Diagnosis Date  . Allergic rhinitis, cause unspecified   . Unspecified cardiovascular disease   . Personal history of malignant neoplasm of breast   . Occlusion and stenosis of carotid artery without mention of cerebral infarction   . Chronic airway obstruction, not elsewhere classified   . Depressive disorder, not elsewhere classified   . Snoring disorder   . Esophageal reflux   . Unspecified hearing loss   . Other and unspecified hyperlipidemia   . Unspecified essential hypertension   . Malignant neoplasm of breast (female), unspecified site   . Osteoarthrosis, unspecified whether generalized or localized, unspecified site   . Osteoporosis, unspecified   . Peripheral vascular disease, unspecified   . Unspecified urinary incontinence   . Spinal stenosis   . ACE-inhibitor cough   . Angina   . Heart murmur     aS CHILD  . Shortness of breath   . Recurrent upper respiratory infection (URI)   . Anxiety   . Pneumonia   .  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 Myocardial infarct     Past Surgical History  Procedure Date  . Mastectomy 1987    Right  . Breast reconstruction 1998    Reconstruction   . Reduction mammaplasty 1998    Left  . Mastoidectomy childhood  . Appendectomy 1948  . Lumbar laminectomy 2003  . Shoulder surgery 02/2005    Bilateral fractures with multiple surgeries  . Breast implant removal 06/12/09    right  . Abdominal hysterectomy   . Fracture surgery     fracture right elbow  . Coronary angioplasty 11/12    distal RCA  . Mastectomy     Family History  Problem Relation Age of Onset  . Heart attack Father 54  . Cancer Mother     ovarian  . Cancer Brother     lung  . Arthritis      family    History  Substance Use Topics  . Smoking status: Former Smoker    Quit date: 07/13/1974  . Smokeless tobacco: Never Used  . Alcohol Use: Yes     occasional    OB History    Grav Para Term Preterm Abortions TAB SAB Ect Mult Living                  Review of Systems  Constitutional: Negative for fever and chills.  HENT: Positive for facial swelling. Negative for neck pain.  Eyes: Negative for visual disturbance.  Respiratory: Positive for chest tightness. Negative for shortness of breath.   Cardiovascular: Negative for chest pain and leg swelling.  Gastrointestinal: Negative for abdominal pain.  Musculoskeletal: Positive for joint swelling.  Skin: Positive for wound.  Neurological: Negative for loss of consciousness, syncope, weakness, numbness and headaches.    Allergies  Augmentin; Amoxicillin; Cephalexin; Clarithromycin; Nifedipine; Olmesartan medoxomil; and Tramadol hcl  Home Medications   Current Outpatient Rx  Name Route Sig Dispense Refill  . ALBUTEROL SULFATE (2.5 MG/3ML) 0.083% IN NEBU Nebulization Take 2.5 mg by nebulization 2 (two) times daily.     . ASPIRIN 81 MG PO TABS Oral Take 81 mg by mouth daily.     Marland Kitchen CALTRATE 600+D PO Oral  Take 1 tablet by mouth daily.     Marland Kitchen CARVEDILOL 12.5 MG PO TABS Oral Take 12.5 mg by mouth 2 (two) times daily with a meal.      . CETIRIZINE HCL 10 MG PO TABS Oral Take 10 mg by mouth 2 (two) times daily as needed. For allergies    . CLONAZEPAM 0.5 MG PO TABS Oral Take 0.5 mg by mouth 2 (two) times daily as needed. For anxiety     . FLUOXETINE HCL 20 MG PO CAPS Oral Take 20 mg by mouth daily.      Marland Kitchen FLUTICASONE PROPIONATE 50 MCG/ACT NA SUSP Nasal Place 2 sprays into the nose daily.      . FUROSEMIDE 40 MG PO TABS Oral Take 40 mg by mouth daily.      Marland Kitchen HYDROCODONE-ACETAMINOPHEN 5-325 MG PO TABS Oral Take 1 tablet by mouth 3 (three) times daily as needed. For pain     . IPRATROPIUM BROMIDE 0.02 % IN SOLN Nebulization Take 500 mcg by nebulization daily.     Marland Kitchen LOSARTAN POTASSIUM 50 MG PO TABS Oral Take 50 mg by mouth daily.      Marland Kitchen MONTELUKAST SODIUM 10 MG PO TABS Oral Take 10 mg by mouth daily as needed. For severe resistant allergy symptoms     . ADULT MULTIVITAMIN W/MINERALS CH Oral Take 1 tablet by mouth daily.      Marland Kitchen OMEPRAZOLE 20 MG PO CPDR Oral Take 20 mg by mouth 2 (two) times daily.      Marland Kitchen PRAVASTATIN SODIUM 80 MG PO TABS Oral Take 80 mg by mouth at bedtime.      . SPIRONOLACTONE 25 MG PO TABS Oral Take 25 mg by mouth daily.      Marland Kitchen TICAGRELOR 90 MG PO TABS Oral Take 90 mg by mouth 2 (two) times daily.      Marland Kitchen VITAMIN B-12 1000 MCG PO TABS Oral Take 1,000 mcg by mouth 3 (three) times a week. Take mon,wed,and fri.    Marland Kitchen CIPROFLOXACIN HCL 250 MG PO TABS Oral Take 1 tablet (250 mg total) by mouth every 12 (twelve) hours. 14 tablet 0  . OXYCODONE-ACETAMINOPHEN 5-325 MG PO TABS Oral Take 2 tablets by mouth every 4 (four) hours as needed for pain. 20 tablet 0    BP 146/71  Pulse 70  Temp(Src) 98.3 F (36.8 C) (Oral)  Resp 15  SpO2 100%  Physical Exam  Nursing note and vitals reviewed. Constitutional: She is oriented to person, place, and time. She appears well-developed and well-nourished.  No distress.  HENT:  Head: Normocephalic.  Mouth/Throat: Oropharynx is clear and moist.       Swelling and contusion noted over pt R eye  Eyes: EOM are  normal. Pupils are equal, round, and reactive to light.  Neck:       C collar applied before arrival  Cardiovascular: Normal rate and regular rhythm.   Pulmonary/Chest: Effort normal and breath sounds normal. No respiratory distress. She has no wheezes. She has no rales.  Abdominal: Soft. Bowel sounds are normal. She exhibits no mass. There is no tenderness. There is no rebound and no guarding.  Musculoskeletal: She exhibits edema and tenderness.       Pt R shoulder with obvious deformity and ttp. Decreased ROM.   Neurological: She is alert and oriented to person, place, and time.       5/5 strength in all ext, sensation intact  Skin: Skin is warm and dry. No rash noted. No erythema.  Psychiatric: She has a normal mood and affect. Her behavior is normal.    ED Course  Procedures (including critical care time)  Labs Reviewed  COMPREHENSIVE METABOLIC PANEL - Abnormal; Notable for the following:    Sodium 129 (*)    BUN 28 (*)    GFR calc non Af Amer 78 (*)    All other components within normal limits  CBC - Abnormal; Notable for the following:    RBC 3.21 (*)    Hemoglobin 10.5 (*)    HCT 30.7 (*)    All other components within normal limits  URINALYSIS, MICROSCOPIC ONLY - Abnormal; Notable for the following:    Leukocytes, UA TRACE (*)    Bacteria, UA MANY (*)    All other components within normal limits  PROTIME-INR  SAMPLE TO BLOOD BANK  APTT  URINE CULTURE   Ct Head Wo Contrast  07/21/2011  *RADIOLOGY REPORT*  Clinical Data:  Trauma.  CT HEAD WITHOUT CONTRAST CT CERVICAL SPINE WITHOUT CONTRAST  Technique:  Multidetector CT imaging of the head and cervical spine was performed following the standard protocol without intravenous contrast.  Multiplanar CT image reconstructions of the cervical spine were also generated.   Comparison:  06/18/2009.  CT HEAD  Findings: Right orbital/supraorbital subcutaneous hematoma without underlying fracture.  No intracranial hemorrhage.  Mild global atrophy without hydrocephalus.  No intracranial mass lesion detected on this unenhanced exam.  Small vessel disease type changes.  Remote small infarct left lenticular nucleus. No CT evidence of large acute infarct.  Small acute infarct cannot be excluded by CT.  Vascular calcifications.  Globes appear to be grossly intact.  No air-fluid level within the right maxillary sinus to indicate orbital floor injury.  Nonspecific radiopaque structure left maxillary sinus.  IMPRESSION: Subcutaneous hematoma right orbital/supraorbital region without underlying fracture or intracranial hemorrhage.  Please see above.  CT CERVICAL SPINE  Findings: Scoliosis is of the cervical spine.  No cervical spine fracture.  No abnormal prevertebral soft tissue swelling.  Cervical spondylotic changes and facet joint degenerative changes contribute to various degrees of spinal stenosis and foraminal narrowing.  Schmorl's node deformities/mild endplate compression deformity of the superior and inferior endplate of T1 vertebra with 40% loss of height mid to anterior aspect appears remote.  Carotid bifurcation calcifications.  Slightly heterogeneous appearance of the thyroid gland.  IMPRESSION: No cervical spine fracture.  Please see above.  Original Report Authenticated By: Fuller Canada, M.D.   Ct Cervical Spine Wo Contrast  07/21/2011  *RADIOLOGY REPORT*  Clinical Data:  Trauma.  CT HEAD WITHOUT CONTRAST CT CERVICAL SPINE WITHOUT CONTRAST  Technique:  Multidetector CT imaging of the head and cervical spine was performed following the standard protocol without  intravenous contrast.  Multiplanar CT image reconstructions of the cervical spine were also generated.  Comparison:  06/18/2009.  CT HEAD  Findings: Right orbital/supraorbital subcutaneous hematoma without underlying  fracture.  No intracranial hemorrhage.  Mild global atrophy without hydrocephalus.  No intracranial mass lesion detected on this unenhanced exam.  Small vessel disease type changes.  Remote small infarct left lenticular nucleus. No CT evidence of large acute infarct.  Small acute infarct cannot be excluded by CT.  Vascular calcifications.  Globes appear to be grossly intact.  No air-fluid level within the right maxillary sinus to indicate orbital floor injury.  Nonspecific radiopaque structure left maxillary sinus.  IMPRESSION: Subcutaneous hematoma right orbital/supraorbital region without underlying fracture or intracranial hemorrhage.  Please see above.  CT CERVICAL SPINE  Findings: Scoliosis is of the cervical spine.  No cervical spine fracture.  No abnormal prevertebral soft tissue swelling.  Cervical spondylotic changes and facet joint degenerative changes contribute to various degrees of spinal stenosis and foraminal narrowing.  Schmorl's node deformities/mild endplate compression deformity of the superior and inferior endplate of T1 vertebra with 40% loss of height mid to anterior aspect appears remote.  Carotid bifurcation calcifications.  Slightly heterogeneous appearance of the thyroid gland.  IMPRESSION: No cervical spine fracture.  Please see above.  Original Report Authenticated By: Fuller Canada, M.D.   Dg Chest Portable 1 View  07/21/2011  *RADIOLOGY REPORT*  Clinical Data: Fall, injury to right shoulder  PORTABLE CHEST - 1 VIEW  Comparison: 06/04/2011  Findings: Chronic interstitial markings.  No frank interstitial edema.  The heart is top normal in size.  Degenerative changes of the visualized thoracolumbar spine.  Suspected nondisplaced right proximal humerus fracture.  IMPRESSION: No evidence of acute cardiopulmonary disease.  Suspected nondisplaced right proximal humerus fracture.  Original Report Authenticated By: Charline Bills, M.D.   Dg Shoulder Right Port  07/21/2011  *RADIOLOGY  REPORT*  Clinical Data: Fall, injury to right shoulder  PORTABLE RIGHT SHOULDER - 2+ VIEW  Comparison: None.  Findings: Suspected nondisplaced fracture of the right humeral neck.  No evidence of dislocation.  The visualized right lung is grossly clear.  IMPRESSION: Suspected nondisplaced fracture of the right humeral neck.  Original Report Authenticated By: Charline Bills, M.D.   Dg Finger Thumb Left  07/21/2011  *RADIOLOGY REPORT*  Clinical Data: Fall, left thumb pain  LEFT THUMB 2+V  Comparison: None.  Findings: No fracture or dislocation is seen.  Degenerative changes at the IP joint.  No definite soft tissue swelling.  IMPRESSION: No fracture or dislocation is seen.  Original Report Authenticated By: Charline Bills, M.D.     1. Proximal humerus fracture   2. Hyponatremia   3. UTI (urinary tract infection)   4. Closed head injury     Date: 07/21/2011  Rate:   Rhythm: normal sinus rhythm  QRS Axis: normal  Intervals: normal  ST/T Wave abnormalities: nonspecific ST changes  Conduction Disutrbances:none  Narrative Interpretation:   Old EKG Reviewed: changes noted    MDM  Pt able to transfer. Well appearing. Will go home with daughter. Has home health to care for pt. Will f/u with ortho. Return for concerns        Loren Racer, MD 07/21/11 1729

## 2011-07-27 ENCOUNTER — Telehealth: Payer: Self-pay | Admitting: Internal Medicine

## 2011-07-27 NOTE — Telephone Encounter (Signed)
Hilda Lias PT from [facility] requested to extend homehealth for 2x a week for 4 weeks. She will send the order to sign but states a verbal order is ok.     Hilda Lias  [phone]

## 2011-07-27 NOTE — Telephone Encounter (Signed)
okay

## 2011-07-27 NOTE — Telephone Encounter (Signed)
Left message for [facility] with homehealth for verbal order.

## 2011-07-28 ENCOUNTER — Ambulatory Visit: Payer: 59 | Admitting: Psychology

## 2011-07-31 ENCOUNTER — Ambulatory Visit: Payer: 59 | Admitting: Internal Medicine

## 2011-08-05 ENCOUNTER — Ambulatory Visit (INDEPENDENT_AMBULATORY_CARE_PROVIDER_SITE_OTHER): Payer: Medicare Other | Admitting: Internal Medicine

## 2011-08-05 ENCOUNTER — Encounter: Payer: Self-pay | Admitting: Internal Medicine

## 2011-08-05 VITALS — BP 142/88 | HR 65 | Temp 98.5°F | Ht 66.0 in | Wt 171.0 lb

## 2011-08-05 DIAGNOSIS — R300 Dysuria: Secondary | ICD-10-CM

## 2011-08-05 DIAGNOSIS — R82998 Other abnormal findings in urine: Secondary | ICD-10-CM

## 2011-08-05 DIAGNOSIS — J4489 Other specified chronic obstructive pulmonary disease: Secondary | ICD-10-CM

## 2011-08-05 DIAGNOSIS — I5042 Chronic combined systolic (congestive) and diastolic (congestive) heart failure: Secondary | ICD-10-CM | POA: Insufficient documentation

## 2011-08-05 DIAGNOSIS — F3289 Other specified depressive episodes: Secondary | ICD-10-CM

## 2011-08-05 DIAGNOSIS — F329 Major depressive disorder, single episode, unspecified: Secondary | ICD-10-CM

## 2011-08-05 DIAGNOSIS — I5022 Chronic systolic (congestive) heart failure: Secondary | ICD-10-CM

## 2011-08-05 DIAGNOSIS — I2510 Atherosclerotic heart disease of native coronary artery without angina pectoris: Secondary | ICD-10-CM

## 2011-08-05 DIAGNOSIS — R8271 Bacteriuria: Secondary | ICD-10-CM | POA: Insufficient documentation

## 2011-08-05 DIAGNOSIS — J449 Chronic obstructive pulmonary disease, unspecified: Secondary | ICD-10-CM

## 2011-08-05 LAB — POCT URINALYSIS DIPSTICK
Blood, UA: NEGATIVE
Ketones, UA: NEGATIVE
pH, UA: 6

## 2011-08-05 MED ORDER — ALBUTEROL SULFATE (2.5 MG/3ML) 0.083% IN NEBU: 2.5000 mg | INHALATION_SOLUTION | Freq: Three times a day (TID) | RESPIRATORY_TRACT | Status: DC | PRN

## 2011-08-05 NOTE — Assessment & Plan Note (Signed)
Better now Will have her stop the ipratropium due to the heart disease

## 2011-08-05 NOTE — Assessment & Plan Note (Signed)
No symptoms now Seems euvolemic Checks weight daily Has echo coming up with Dr Sharyn Lull

## 2011-08-05 NOTE — Assessment & Plan Note (Signed)
On appropriate meds No changes needed Labs next time

## 2011-08-05 NOTE — Assessment & Plan Note (Signed)
persistent abnormal urine Will hold off on Rx unless has symptoms

## 2011-08-05 NOTE — Progress Notes (Signed)
Subjective:    Patient ID: Haley Harris, female    DOB: 1928/11/28, 78 y.o.   MRN: [id number]  HPI Here with DIL---Haley Harris  Doing well Pain is much better Only takes the hydrocodone a couple times a day Sees Dr Otelia Sergeant soon---may come out of brace for humerus fracture  Still with [facility] CHF seems to be quiet No SOB--even with activity (though limited with arm) Sees Dr Sharyn Lull in March No chest pain No palpitations BP has been fine  Believes her mood problems were related to heart trouble which were somewhat subclinical Thinks she doesn't need to see Dr Laymond Purser anymore Her faith is helping her also  No trouble with COPD No sig cough  No urinary symptoms now Requested recheck of urine  Current Outpatient Prescriptions on File Prior to Visit  Medication Sig Dispense Refill  . albuterol (PROVENTIL) (2.5 MG/3ML) 0.083% nebulizer solution Take 2.5 mg by nebulization 3 (three) times daily as needed.       Marland Kitchen aspirin 81 MG tablet Take 81 mg by mouth daily.       . Calcium Carbonate-Vitamin D (CALTRATE 600+D PO) Take 1 tablet by mouth daily.       . carvedilol (COREG) 12.5 MG tablet Take 12.5 mg by mouth 2 (two) times daily with a meal.        . cetirizine (ZYRTEC) 10 MG tablet Take 10 mg by mouth 2 (two) times daily as needed. For allergies      . clonazePAM (KLONOPIN) 0.5 MG tablet Take 0.5 mg by mouth 2 (two) times daily as needed. For anxiety       . FLUoxetine (PROZAC) 20 MG capsule Take 20 mg by mouth daily.        . fluticasone (FLONASE) 50 MCG/ACT nasal spray Place 2 sprays into the nose daily.        . furosemide (LASIX) 40 MG tablet Take 40 mg by mouth daily.        Marland Kitchen HYDROcodone-acetaminophen (NORCO) 5-325 MG per tablet Take 1 tablet by mouth 3 (three) times daily as needed. For pain       . losartan (COZAAR) 50 MG tablet Take 50 mg by mouth daily.        . montelukast (SINGULAIR) 10 MG tablet Take 10 mg by mouth daily as needed. For severe resistant allergy  symptoms       . Multiple Vitamin (MULITIVITAMIN WITH MINERALS) TABS Take 1 tablet by mouth daily.        Marland Kitchen omeprazole (PRILOSEC) 20 MG capsule Take 20 mg by mouth 2 (two) times daily.        . pravastatin (PRAVACHOL) 80 MG tablet Take 80 mg by mouth at bedtime.        Marland Kitchen spironolactone (ALDACTONE) 25 MG tablet Take 25 mg by mouth daily.        . Ticagrelor (BRILINTA) 90 MG TABS tablet Take 90 mg by mouth 2 (two) times daily.        . vitamin B-12 (CYANOCOBALAMIN) 1000 MCG tablet Take 1,000 mcg by mouth 3 (three) times a week. Take mon,wed,and fri.        Allergies  Allergen Reactions  . Augmentin Anaphylaxis  . Amoxicillin     REACTION: Angioedema requiring intubation  . Cephalexin Other (See Comments)    Reaction unknown  . Clarithromycin Other (See Comments)    Reaction unknown  . Nifedipine Other (See Comments)    Reaction unknown  . Olmesartan Medoxomil  REACTION: cough  . Tramadol Hcl Other (See Comments)    Reaction unknown    Past Medical History  Diagnosis Date  . Allergic rhinitis, cause unspecified   . Unspecified cardiovascular disease   . Personal history of malignant neoplasm of breast   . Occlusion and stenosis of carotid artery without mention of cerebral infarction   . Chronic airway obstruction, not elsewhere classified   . Depressive disorder, not elsewhere classified   . Snoring disorder   . Esophageal reflux   . Unspecified hearing loss   . Other and unspecified hyperlipidemia   . Unspecified essential hypertension   . Malignant neoplasm of breast (female), unspecified site   . Osteoarthrosis, unspecified whether generalized or localized, unspecified site   . Osteoporosis, unspecified   . Peripheral vascular disease, unspecified   . Unspecified urinary incontinence   . Spinal stenosis   . ACE-inhibitor cough   . Angina   . Heart murmur     aS CHILD  . Shortness of breath   . Recurrent upper respiratory infection (URI)   . Anxiety   .  Pneumonia   . 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 Myocardial infarct     Past Surgical History  Procedure Date  . Mastectomy 1987    Right  . Breast reconstruction 1998    Reconstruction   . Reduction mammaplasty 1998    Left  . Mastoidectomy childhood  . Appendectomy 1948  . Lumbar laminectomy 2003  . Shoulder surgery 02/2005    Bilateral fractures with multiple surgeries  . Breast implant removal 06/12/09    right  . Abdominal hysterectomy   . Fracture surgery     fracture right elbow  . Coronary angioplasty 11/12    distal RCA  . Mastectomy     Family History  Problem Relation Age of Onset  . Heart attack Father 32  . Cancer Mother     ovarian  . Cancer Brother     lung  . Arthritis      family    History   Social History  . Marital Status: Widowed    Spouse Name: N/A    Number of Children: 2  . Years of Education: N/A   Occupational History  . 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     Social History Main Topics  . Smoking status: Former Smoker    Quit date: 07/13/1974  . Smokeless tobacco: Never Used  . Alcohol Use: Yes     occasional  . Drug Use: No  . Sexually Active: No   Other Topics Concern  . Not on file   Social History Narrative  . No narrative on file   Review of Systems Sleeping okay Appetite is good Weight is stable    Objective:   Physical Exam  Constitutional: She appears well-developed and well-nourished. No distress.  Neck: Normal range of motion.  Cardiovascular: Normal rate, regular rhythm and normal heart sounds.  Exam reveals no gallop.   No murmur heard. Pulmonary/Chest: Effort normal. No respiratory distress. She has no rales.       Very slight exp wheeze but not tight  Musculoskeletal: She exhibits no edema.       Right arm in sling  Lymphadenopathy:    She has no cervical adenopathy.  Psychiatric: She has a normal mood and affect. Her behavior is normal. Judgment and thought  content normal.  Assessment & Plan:

## 2011-08-05 NOTE — Assessment & Plan Note (Signed)
Marked improvement now Will consider weaning fluoxetine at next visit

## 2011-08-07 ENCOUNTER — Telehealth: Payer: Self-pay | Admitting: Internal Medicine

## 2011-08-07 MED ORDER — SULFAMETHOXAZOLE-TMP DS 800-160 MG PO TABS: 1.0000 | ORAL_TABLET | Freq: Two times a day (BID) | ORAL | Status: AC

## 2011-08-07 NOTE — Telephone Encounter (Signed)
Triage Record Num: [id number] Operator: Meribeth Mattes Patient Name: Haley Harris Call Date & Time: 08/07/2011 8:42:06AM Patient Phone: [phone] PCP: Tillman Abide Patient Gender: Female PCP Fax : [phone] Patient DOB: [birth year] Practice Name: [facility] Reason for Call: OFFICE TO F/U WITH PT. [name]: Luella/Patient; PCP: Tillman Abide I.; CB#: [phone]; Was seen in office 08/05/11, had u/a and count came back abnormal but was not having any sx, currently having UTI sx, onset 08/06/11, afebrile, hurts to void, was on Cipro for UTI 07/22/11. Pt uses pharmacy [phone]. Protocol(s) Used: Urinary Symptoms - Female Recommended Outcome per Protocol: See Provider within 24 hours Reason for Outcome: Has one or more urinary tract symptoms AND has not been previously evaluated Care Advice: ~ 08/07/2011 8:53:43AM Page 1 of 1 [id number]

## 2011-08-07 NOTE — Telephone Encounter (Signed)
Discussed with patient Haley Harris treat with septra Plan to check culture if any residual symptoms

## 2011-08-18 ENCOUNTER — Telehealth: Payer: Self-pay | Admitting: Internal Medicine

## 2011-08-18 NOTE — Telephone Encounter (Signed)
Requests new refill for Berlinta 90 mg twice a day at [facility].  Also just completed antibiotic for UTI.  Does Dr. Alphonsus Sias want to complete another u/a?

## 2011-08-19 MED ORDER — TICAGRELOR 90 MG PO TABS: 90.0000 mg | ORAL_TABLET | Freq: Two times a day (BID) | ORAL | Status: DC

## 2011-08-19 NOTE — Telephone Encounter (Signed)
Okay to refill for 1 year  No urinalysis again unless recurrent symptoms

## 2011-08-19 NOTE — Telephone Encounter (Signed)
rx sent to pharmacy by e-script Spoke with patient and advised results   

## 2011-08-25 ENCOUNTER — Other Ambulatory Visit: Payer: Self-pay | Admitting: *Deleted

## 2011-08-25 MED ORDER — LOSARTAN POTASSIUM 50 MG PO TABS: 50.0000 mg | ORAL_TABLET | Freq: Every day | ORAL | Status: DC

## 2011-08-25 MED ORDER — MONTELUKAST SODIUM 10 MG PO TABS: 10.0000 mg | ORAL_TABLET | Freq: Every day | ORAL | Status: DC | PRN

## 2011-08-25 MED ORDER — SPIRONOLACTONE 25 MG PO TABS: 25.0000 mg | ORAL_TABLET | Freq: Every day | ORAL | Status: DC

## 2011-08-31 ENCOUNTER — Other Ambulatory Visit: Payer: Self-pay | Admitting: *Deleted

## 2011-08-31 MED ORDER — CARVEDILOL 12.5 MG PO TABS: 12.5000 mg | ORAL_TABLET | Freq: Two times a day (BID) | ORAL | Status: DC

## 2011-08-31 NOTE — Telephone Encounter (Signed)
She is my patient Refill sent electronically

## 2011-08-31 NOTE — Telephone Encounter (Signed)
Fax from [facility] asking if refills are approved, fax states that pt is not Dr. Santiago Bur pt anymore. Please advise

## 2011-10-23 ENCOUNTER — Telehealth: Payer: Self-pay | Admitting: Internal Medicine

## 2011-10-23 ENCOUNTER — Ambulatory Visit (INDEPENDENT_AMBULATORY_CARE_PROVIDER_SITE_OTHER): Payer: Medicare Other | Admitting: Family Medicine

## 2011-10-23 ENCOUNTER — Encounter (HOSPITAL_COMMUNITY): Payer: Self-pay | Admitting: *Deleted

## 2011-10-23 ENCOUNTER — Emergency Department (HOSPITAL_COMMUNITY): Payer: Medicare Other

## 2011-10-23 ENCOUNTER — Emergency Department (HOSPITAL_COMMUNITY)
Admission: EM | Admit: 2011-10-23 | Discharge: 2011-10-24 | Disposition: A | Discharge status: Home / Self Care | Payer: Medicare Other | Attending: Emergency Medicine | Admitting: Emergency Medicine

## 2011-10-23 ENCOUNTER — Encounter: Payer: Self-pay | Admitting: Family Medicine

## 2011-10-23 VITALS — BP 130/80 | HR 89 | Temp 97.7°F | Wt 172.1 lb

## 2011-10-23 DIAGNOSIS — J4489 Other specified chronic obstructive pulmonary disease: Secondary | ICD-10-CM | POA: Insufficient documentation

## 2011-10-23 DIAGNOSIS — R109 Unspecified abdominal pain: Secondary | ICD-10-CM | POA: Insufficient documentation

## 2011-10-23 DIAGNOSIS — M1990 Unspecified osteoarthritis, unspecified site: Secondary | ICD-10-CM | POA: Insufficient documentation

## 2011-10-23 DIAGNOSIS — M546 Pain in thoracic spine: Secondary | ICD-10-CM | POA: Insufficient documentation

## 2011-10-23 DIAGNOSIS — E785 Hyperlipidemia, unspecified: Secondary | ICD-10-CM | POA: Insufficient documentation

## 2011-10-23 DIAGNOSIS — I252 Old myocardial infarction: Secondary | ICD-10-CM | POA: Insufficient documentation

## 2011-10-23 DIAGNOSIS — Z853 Personal history of malignant neoplasm of breast: Secondary | ICD-10-CM | POA: Insufficient documentation

## 2011-10-23 DIAGNOSIS — X58XXXA Exposure to other specified factors, initial encounter: Secondary | ICD-10-CM | POA: Insufficient documentation

## 2011-10-23 DIAGNOSIS — K219 Gastro-esophageal reflux disease without esophagitis: Secondary | ICD-10-CM | POA: Insufficient documentation

## 2011-10-23 DIAGNOSIS — Z79899 Other long term (current) drug therapy: Secondary | ICD-10-CM | POA: Insufficient documentation

## 2011-10-23 DIAGNOSIS — F341 Dysthymic disorder: Secondary | ICD-10-CM | POA: Insufficient documentation

## 2011-10-23 DIAGNOSIS — M545 Low back pain, unspecified: Secondary | ICD-10-CM | POA: Insufficient documentation

## 2011-10-23 DIAGNOSIS — S22080A Wedge compression fracture of T11-T12 vertebra, initial encounter for closed fracture: Secondary | ICD-10-CM

## 2011-10-23 DIAGNOSIS — M810 Age-related osteoporosis without current pathological fracture: Secondary | ICD-10-CM | POA: Insufficient documentation

## 2011-10-23 DIAGNOSIS — I739 Peripheral vascular disease, unspecified: Secondary | ICD-10-CM | POA: Insufficient documentation

## 2011-10-23 DIAGNOSIS — S22009A Unspecified fracture of unspecified thoracic vertebra, initial encounter for closed fracture: Secondary | ICD-10-CM | POA: Insufficient documentation

## 2011-10-23 DIAGNOSIS — Z7982 Long term (current) use of aspirin: Secondary | ICD-10-CM | POA: Insufficient documentation

## 2011-10-23 DIAGNOSIS — J449 Chronic obstructive pulmonary disease, unspecified: Secondary | ICD-10-CM | POA: Insufficient documentation

## 2011-10-23 DIAGNOSIS — I10 Essential (primary) hypertension: Secondary | ICD-10-CM | POA: Insufficient documentation

## 2011-10-23 LAB — DIFFERENTIAL
Lymphocytes Relative: 17 % (ref 12–46)
Lymphs Abs: 1.5 10*3/uL (ref 0.7–4.0)
Monocytes Relative: 13 % — ABNORMAL HIGH (ref 3–12)
Neutro Abs: 6.1 10*3/uL (ref 1.7–7.7)
Neutrophils Relative %: 68 % (ref 43–77)

## 2011-10-23 LAB — CBC
Hemoglobin: 13.1 g/dL (ref 12.0–15.0)
Platelets: 282 10*3/uL (ref 150–400)
RBC: 4.06 MIL/uL (ref 3.87–5.11)
WBC: 8.9 10*3/uL (ref 4.0–10.5)

## 2011-10-23 LAB — COMPREHENSIVE METABOLIC PANEL
ALT: 21 U/L (ref 0–35)
Alkaline Phosphatase: 84 U/L (ref 39–117)
BUN: 33 mg/dL — ABNORMAL HIGH (ref 6–23)
Chloride: 89 mEq/L — ABNORMAL LOW (ref 96–112)
GFR calc Af Amer: 57 mL/min — ABNORMAL LOW (ref >90)
Glucose, Bld: 104 mg/dL — ABNORMAL HIGH (ref 70–99)
Potassium: 4 mEq/L (ref 3.5–5.1)
Sodium: 130 mEq/L — ABNORMAL LOW (ref 135–145)
Total Bilirubin: 0.5 mg/dL (ref 0.3–1.2)
Total Protein: 7.7 g/dL (ref 6.0–8.3)

## 2011-10-23 LAB — URINALYSIS, ROUTINE W REFLEX MICROSCOPIC
Bilirubin Urine: NEGATIVE
Glucose, UA: NEGATIVE mg/dL
Ketones, ur: NEGATIVE mg/dL
Nitrite: NEGATIVE
Protein, ur: NEGATIVE mg/dL
pH: 5.5 (ref 5.0–8.0)

## 2011-10-23 LAB — POCT URINALYSIS DIPSTICK
Glucose, UA: NEGATIVE
Ketones, POC UA: NEGATIVE
Nitrite, UA: NEGATIVE
Spec Grav, UA: 1.01
pH, UA: 6

## 2011-10-23 LAB — URINE MICROSCOPIC-ADD ON

## 2011-10-23 MED ORDER — MORPHINE SULFATE 4 MG/ML IJ SOLN: 4.0000 mg | Freq: Once | INTRAMUSCULAR | Status: DC

## 2011-10-23 MED ORDER — SODIUM CHLORIDE 0.9 % IV BOLUS (SEPSIS): 500.0000 mL | Freq: Once | INTRAVENOUS | Status: DC

## 2011-10-23 MED ORDER — ONDANSETRON 4 MG PO TBDP
8.0000 mg | ORAL_TABLET | Freq: Once | ORAL | Status: AC
  Administered 2011-10-23: 8 mg via ORAL
  Filled 2011-10-23: qty 1

## 2011-10-23 MED ORDER — ONDANSETRON HCL 4 MG/2ML IJ SOLN: 4.0000 mg | Freq: Once | INTRAMUSCULAR | Status: DC

## 2011-10-23 MED ORDER — HYDROMORPHONE HCL PF 2 MG/ML IJ SOLN
2.0000 mg | Freq: Once | INTRAMUSCULAR | Status: AC
  Administered 2011-10-23: 2 mg via INTRAMUSCULAR
  Filled 2011-10-23: qty 1

## 2011-10-23 NOTE — Progress Notes (Signed)
Subjective:    Patient ID: Haley Harris, female    DOB: 01/17/1929, 78 y.o.   MRN: [id number]  HPI CC: ?UTI  Presents with daughter as a late day add on with 2d h/o sharp stabbing pain in right flank.  Endorses severe pain - "i cannot handle another night like last night"  Recently started on percocets (5mg ) and steroid course for presumed bilateral sacroiliitis by ortho.  That is better.  Now for last 2 days having sharp stabbing pain "knife twisting" below right flank that radiates to anterior right abdomen in band distribution.  Trouble with catching breath when stands up.  Constant pain, ice/heat helps.  Feels nauseated and gassy.  Voiding fine.  No dysuria, urgency, frequency, hematuria.  No fevers/chills.  Some constipation from percocets.  Last BM was this morning, still passing gas fine.  Notes searching for words more than normal, slightly more confused than normal according to daughter.  Significant h/o UTIs in past.  Has had shingles shot 2012.  No h/o shingles.  H/o MI with CHF 05/2011, fluid restricted currently per cards.  Currently on 40mg  lasix and spironolactone daily.  Past Medical History  Diagnosis Date  . Allergic rhinitis, cause unspecified   . Unspecified cardiovascular disease   . Personal history of malignant neoplasm of breast   . Occlusion and stenosis of carotid artery without mention of cerebral infarction   . Chronic airway obstruction, not elsewhere classified   . Depressive disorder, not elsewhere classified   . Other dyspnea and respiratory abnormality   . Esophageal reflux   . Unspecified hearing loss   . Other and unspecified hyperlipidemia   . Unspecified essential hypertension   . Malignant neoplasm of breast (female), unspecified site   . Osteoarthrosis, unspecified whether generalized or localized, unspecified site   . Osteoporosis, unspecified   . Peripheral vascular disease, unspecified   . Unspecified urinary incontinence   . Spinal  stenosis   . ACE-inhibitor cough   . Angina   . Heart murmur     aS CHILD  . Shortness of breath   . Recurrent upper respiratory infection (URI)   . Anxiety   . Pneumonia   . 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 Myocardial infarct      Review of Systems Per HPI    Objective:   Physical Exam  Nursing note and vitals reviewed. Constitutional: She appears well-developed and well-nourished. She appears distressed.  HENT:  Head: Normocephalic and atraumatic.  Mouth/Throat: Oropharynx is clear and moist. No oropharyngeal exudate.  Eyes: Conjunctivae and EOM are normal. Pupils are equal, round, and reactive to light. No scleral icterus.  Neck: Normal range of motion. Neck supple.  Cardiovascular: Normal rate, regular rhythm, normal heart sounds and intact distal pulses.   No murmur heard. Pulmonary/Chest: Effort normal and breath sounds normal. No respiratory distress. She has no wheezes. She has no rales. She exhibits tenderness.       Tender to palpation R intercostals  Abdominal: Soft. She exhibits distension. Bowel sounds are increased. There is tenderness (moderate to light palpation) in the right upper quadrant. There is CVA tenderness (R, mild). There is no rebound, no guarding and negative Murphy's sign.       Hyperactive BS.  Musculoskeletal: She exhibits no edema.  Lymphadenopathy:    She has no cervical adenopathy.  Skin: Skin is warm and dry. No rash noted.       Several bruises on forearms from blood  thinners  Psychiatric: She has a normal mood and affect.       Assessment & Plan:  To ER - spoke with charge nurse to inform being sent.

## 2011-10-23 NOTE — Assessment & Plan Note (Addendum)
78 yo with significant h/o CAD/MI with stent, PVD, and spinal stenosis followed by ortho. UA/micro overall not consistent with infection, but I did send urine culture. Not consistent with nephrolithiasis or pyelo given no hematuria, no fevers/chills. Still has gallbladder, ?cholelithiasis but again story not consistent with this pain.  Doubt cholecystitis. Considered early shingles but skin not hyperalgesic. May just be intercostal muscle strain but has not improved on percocets, intolerant to NSAIDs. Cannot rule out intra abd pathology - on ASA/ticagrelor. Needs lab work as well as further evaluation and possible imaging and better pain control - rec eval at ER.

## 2011-10-23 NOTE — Telephone Encounter (Signed)
Per Emergent Line: [name]: Zuly/Patient; PCP: Tillman Abide I.; CB#: [phone]. [name]. Beginning Thurs 4/11 she has pain on the right side of her back that radiates into her abd. [name] pain is severe, as if you are "jabbing me with a knife." She has doubled up on her Percocet that she was given by Orthopedics and pain persists. Per Flank Pain Protocol, See in 4hrs. No open slots left for today, per Asher Muir at back line. RN spoke with caller and advised she will have to be seen at [facility] today. [name] are both upset. [name] she sat for 4hrs [facility] to [facility]. [name] asking that staff be contacted again. RN spoke with Jamesetta So who sent note/spoke with MD. Callers advised and asked to please hold a bit longer x 2 while on hold. Per PCP, if sxs are r/t UTI she should be seen today if sxs are muscle r/t she can be seen in Sat am clinic. Jamesetta So spoke with Dtr, Andrey Campanile and advised same. [name] at 16:30 by Dr Sharen Hones per MD/Phyllis. Scheduled in EPIC by [place].

## 2011-10-23 NOTE — ED Notes (Signed)
C/o R flank pain, also radiates around to RUQ, denies other sx, (denies: sob, nvd, fever, urinary sx or other sx), blood noted in urine. Has a h/o gall bladder issues, but states, "it always comes back negative", h/o appendectomy. Mentions recent sacral pain, taking prednisone & pain med for same. Also h/o CHF, no pitting edema noted. abd soft NT, MAEx4, skin W&D, resps e/u, speakingin clear complete sentences.

## 2011-10-23 NOTE — Telephone Encounter (Signed)
Stabbing pain in lower rt side of back radiating to lower mid area near kidneys.  Patient assertive about being seen today. CAN said it is a see within 4 hours disposition.

## 2011-10-23 NOTE — ED Notes (Addendum)
Called patient x1 in main waiting area; patient back in triage waiting area.  Notified tech first/triage tech to re-assess vital signs.

## 2011-10-23 NOTE — Patient Instructions (Signed)
To ER today to get checked out. I will give them a call that you're coming.

## 2011-10-23 NOTE — ED Notes (Signed)
The pt has had lower back pain since Saturday and she has been seen by dr Otelia Sergeant where she had xrays. And was given pain med..  She is constipated.  The pain is worse in her lower back.  She had a urine  Test that was negative

## 2011-10-23 NOTE — ED Notes (Signed)
Attempted to call patient to re-assess vital signs; no answer.  Will call again.

## 2011-10-23 NOTE — ED Provider Notes (Signed)
History     CSN: [id number]  Arrival date & time 10/23/11  1745   First MD Initiated Contact with Patient 10/23/11 2147      Chief Complaint  Patient presents with  . Back Pain    (Consider location/radiation/quality/duration/timing/severity/associated sxs/prior treatment) HPI  Pt presents to the ED with complaints of low back pain for three days. She was originally see by her PCP today who sent her here to the ED. She states that she has not had back pain like this in the past and that it does not matter if she is moving or laying still, that the pain is severe. She states that the pain wraps around towards the front and down to her lower right abdominal region. She does not have an appendix or uterus anymore. She is in no acute distress, is not feeling general or focal weakness.   Past Medical History  Diagnosis Date  . Allergic rhinitis, cause unspecified   . Unspecified cardiovascular disease   . Personal history of malignant neoplasm of breast   . Occlusion and stenosis of carotid artery without mention of cerebral infarction   . Chronic airway obstruction, not elsewhere classified   . Depressive disorder, not elsewhere classified   . Other dyspnea and respiratory abnormality   . Esophageal reflux   . Unspecified hearing loss   . Other and unspecified hyperlipidemia   . Unspecified essential hypertension   . Malignant neoplasm of breast (female), unspecified site   . Osteoarthrosis, unspecified whether generalized or localized, unspecified site   . Osteoporosis, unspecified   . Peripheral vascular disease, unspecified   . Unspecified urinary incontinence   . Spinal stenosis   . ACE-inhibitor cough   . Angina   . Heart murmur     aS CHILD  . Shortness of breath   . Recurrent upper respiratory infection (URI)   . Anxiety   . Pneumonia   . 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 Myocardial infarct     Past Surgical History  Procedure Date  .  Mastectomy 1987    Right  . Breast reconstruction 1998    Reconstruction   . Reduction mammaplasty 1998    Left  . Mastoidectomy childhood  . Appendectomy 1948  . Lumbar laminectomy 2003  . Shoulder surgery 02/2005    Bilateral fractures with multiple surgeries  . Breast implant removal 06/12/09    right  . Abdominal hysterectomy   . Fracture surgery     fracture right elbow  . Coronary angioplasty 11/12    distal RCA  . Mastectomy     Family History  Problem Relation Age of Onset  . Heart attack Father 59  . Cancer Mother     ovarian  . Cancer Brother     lung  . Arthritis      family    History  Substance Use Topics  . Smoking status: Former Smoker    Quit date: 07/13/1974  . Smokeless tobacco: Never Used  . Alcohol Use: Yes     occasional    OB History    Grav Para Term Preterm Abortions TAB SAB Ect Mult Living                  Review of Systems  Allergies  Augmentin; Amoxicillin; Cephalexin; Clarithromycin; Nifedipine; Olmesartan medoxomil; and Tramadol hcl  Home Medications   Current Outpatient Rx  Name Route Sig Dispense Refill  . ALBUTEROL SULFATE (2.5 MG/3ML) 0.083%  IN NEBU Nebulization Take 2.5 mg by nebulization every 6 (six) hours as needed. For shortness of breath    . ASPIRIN 81 MG PO TABS Oral Take 81 mg by mouth daily.     Marland Kitchen CALTRATE 600+D PO Oral Take 1 tablet by mouth daily.     Marland Kitchen CARVEDILOL 12.5 MG PO TABS Oral Take 1 tablet (12.5 mg total) by mouth 2 (two) times daily with a meal. 60 tablet 11  . CETIRIZINE HCL 10 MG PO TABS Oral Take 10 mg by mouth 2 (two) times daily as needed. For allergies    . CYCLOBENZAPRINE HCL 5 MG PO TABS Oral Take 5 mg by mouth every 6 (six) hours as needed. For pain    . FLUOXETINE HCL 20 MG PO CAPS Oral Take 20 mg by mouth daily.      Marland Kitchen FLUTICASONE PROPIONATE 50 MCG/ACT NA SUSP Nasal Place 2 sprays into the nose daily.      . FUROSEMIDE 40 MG PO TABS Oral Take 40 mg by mouth daily.      Marland Kitchen GABAPENTIN 100 MG  PO CAPS Oral Take 100 mg by mouth 2 (two) times daily.    Marland Kitchen HYDROCODONE-ACETAMINOPHEN 5-325 MG PO TABS Oral Take 1 tablet by mouth 3 (three) times daily as needed. For pain     . LOSARTAN POTASSIUM 50 MG PO TABS Oral Take 1 tablet (50 mg total) by mouth daily. 30 tablet 3  . MONTELUKAST SODIUM 10 MG PO TABS Oral Take 1 tablet (10 mg total) by mouth daily as needed. For severe resistant allergy symptoms 30 tablet 11  . ADULT MULTIVITAMIN W/MINERALS CH Oral Take 1 tablet by mouth daily.      Marland Kitchen OMEPRAZOLE 20 MG PO CPDR Oral Take 20 mg by mouth 2 (two) times daily.      . OXYCODONE-ACETAMINOPHEN 5-325 MG PO TABS Oral Take 1 tablet by mouth every 4 (four) hours as needed. For pain    . PRAVASTATIN SODIUM 80 MG PO TABS Oral Take 80 mg by mouth at bedtime.      . SPIRONOLACTONE 25 MG PO TABS Oral Take 1 tablet (25 mg total) by mouth daily. 30 tablet 3  . TICAGRELOR 90 MG PO TABS Oral Take 1 tablet (90 mg total) by mouth 2 (two) times daily. 60 tablet 11  . VITAMIN B-12 1000 MCG PO TABS Oral Take 1,000 mcg by mouth 3 (three) times a week. Take mon,wed,and fri.    . OXYCODONE-ACETAMINOPHEN 5-325 MG PO TABS Oral Take 2 tablets by mouth every 6 (six) hours as needed for pain. 20 tablet 0    BP 143/65  Pulse 80  Temp(Src) 98.1 F (36.7 C) (Oral)  Resp 20  SpO2 100%  Physical Exam  Nursing note and vitals reviewed. Constitutional: She appears well-developed and well-nourished. No distress.  HENT:  Head: Normocephalic and atraumatic.  Eyes: Pupils are equal, round, and reactive to light.  Neck: Normal range of motion. Neck supple.  Cardiovascular: Normal rate and regular rhythm.   Pulmonary/Chest: Effort normal.  Abdominal: Soft.  Musculoskeletal:       Lumbar back: She exhibits decreased range of motion, tenderness, bony tenderness and pain. She exhibits no swelling, no edema, no deformity, no laceration, no spasm and normal pulse.       Pt has full sensory and motor function of bilateral legs  with no complaints of loss of bowel or bladder control. strength is equal and adequate bilaterallly.  Neurological: She is alert.  She has normal strength. She displays a negative Romberg sign.  Skin: Skin is warm and dry.    ED Course  Procedures (including critical care time)  Labs Reviewed  DIFFERENTIAL - Abnormal; Notable for the following:    Monocytes Relative 13 (*)    Monocytes Absolute 1.1 (*)    All other components within normal limits  COMPREHENSIVE METABOLIC PANEL - Abnormal; Notable for the following:    Sodium 130 (*)    Chloride 89 (*)    Glucose, Bld 104 (*)    BUN 33 (*)    GFR calc non Af Amer 49 (*)    GFR calc Af Amer 57 (*)    All other components within normal limits  URINALYSIS, ROUTINE W REFLEX MICROSCOPIC - Abnormal; Notable for the following:    Hgb urine dipstick SMALL (*)    Leukocytes, UA SMALL (*)    All other components within normal limits  CBC  URINE MICROSCOPIC-ADD ON   Dg Thoracic Spine 2 View  10/24/2011  *RADIOLOGY REPORT*  Clinical Data: Severe mid and lower back pain.  THORACIC SPINE - 2 VIEW  Comparison: Chest radiograph on 02/25/2011  Findings: Osteopenia is demonstrated.  And moderate compression fracture of the T12 vertebral body is demonstrated which is new since previous lateral chest radiograph on 02/25/2011.  T6 and T8 vertebral body compression fractures show no significant change since previous exam.  Thoracic kyphoscoliosis appears stable.  IMPRESSION:  1.  Moderate osteopenia T12 vertebral body compression fracture, which is new since 02/25/2011. 2.  Old T6 and T8 vertebral body compression fractures. 3.  Stable thoracic kyphoscoliosis.  Original Report Authenticated By: Danae Orleans, M.D.   Dg Lumbar Spine Complete  10/24/2011  *RADIOLOGY REPORT*  Clinical Data: Severe lumbar back pain.  Previous lumbar spine fusion.  LUMBAR SPINE - COMPLETE 4+ VIEW  Comparison: 03/16/2007and chest radiograph on 02/25/2011  Findings: Previous  interbody and posterior lumbar spine fusion is again seen at L4-5 and L5-S1.  Fusion hardware appears intact. There is chronic grade 1 to II anterolisthesis seen at levels of L4- 5 and L5-S1.  Generalized osteopenia is noted. Moderate T12 vertebral body compression fracture deformities seen, which is indeterminate age radiographically. This area was not seen on previous intraoperative radiographs.  Mild degenerative disc disease is seen at all lumbar levels.  Facet DJD is seen at levels of L2-3 and L3-4.  IMPRESSION:  1.  Osteopenic T12 vertebral body compression deformity, which is new since previous chest radiograph on 02/25/2011. 2.  Old lumbar spine fusions at L4-5 and L5-S1 with chronic grade 1 to II anterolisthesis at these levels. 3.  Degenerative lumbar spondylosis.  Original Report Authenticated By: Danae Orleans, M.D.     1. Compression fracture of T12 vertebra       MDM  Pts pain has resolved with 2mg  IM dilaudid. pts has a T12 compression fracture on xray that is approx 50% decrease in vertebral height. The patient has a n Orthopedist Dr. Maia Breslow who she can follow-up with this week. Pain management is my plan. Dr. Effie Shy has examined the patient as well and we have discussed the plan with patient. No neurological deficits that require immediate intervention.  Pt has been advised of the symptoms that warrant their return to the ED. Patient has voiced understanding and has agreed to follow-up with the PCP or specialist.        Dorthula Matas, PA 10/24/11 [phone]

## 2011-10-23 NOTE — ED Notes (Signed)
Asked tech first again to re-assess vitals.

## 2011-10-23 NOTE — Telephone Encounter (Signed)
Will await his evaluation 

## 2011-10-23 NOTE — Telephone Encounter (Signed)
Patient Name: Haley Harris Call Date & Time: 10/23/2011 2:12:12PM Patient Phone: [phone] PCP: Tillman Abide Patient Gender: Female PCP Fax : [phone] Patient DOB: 30-Jan-1929 Practice Name: [facility] Reason for Call: Per Emergent Line: [name]: [name]/Patient; PCP: Tillman Abide I.; CB#: [phone]. [name]. Beginning Thurs 4/11 she has pain on the right side of her back that radiates into her abd. [name] pain is severe, as if you are "jabbing me with a knife." She has doubled up on her Percocet that she was given by Orthopedics and pain persists. Per Flank Pain Protocol, See in 4hrs. No open slots left for today, per Asher Muir at back line. RN spoke with caller and advised she will have to be seen at [facility] today. [name] are both upset. [name] she sat for 4hrs when [facility] went to [facility]. [name] asking that staff be contacted again. RN spoke with Jamesetta So who sent note/spoke with MD. Callers advised and asked to please hold a bit longer x 2 while on hold. Per PCP, if sxs are r/t UTI she should be seen today if sxs are muscle r/t she can be seen in Sat am clinic. Jamesetta So spoke with Dtr, Andrey Campanile and advised same. [name] at 16:30 by Dr Sharen Hones per MD/Phyllis. Scheduled in EPIC by [place]. Note to MD via EPIC. Protocol(s) Used: Flank Pain Recommended Outcome per Protocol: See Provider within 4 hours Reason for Outcome: Sudden onset of colicky pain that may radiate to groin or perineum; bloody urine may follow episode

## 2011-10-24 MED ORDER — OXYCODONE-ACETAMINOPHEN 5-325 MG PO TABS: 2.0000 | ORAL_TABLET | Freq: Four times a day (QID) | ORAL | Status: DC | PRN

## 2011-10-24 MED ORDER — HYDROMORPHONE HCL PF 2 MG/ML IJ SOLN
2.0000 mg | Freq: Once | INTRAMUSCULAR | Status: AC
  Administered 2011-10-24: 2 mg via INTRAMUSCULAR
  Filled 2011-10-24: qty 1

## 2011-10-24 MED ORDER — ONDANSETRON 4 MG PO TBDP
4.0000 mg | ORAL_TABLET | Freq: Once | ORAL | Status: AC
  Administered 2011-10-24: 4 mg via ORAL
  Filled 2011-10-24: qty 1

## 2011-10-24 NOTE — ED Provider Notes (Signed)
She has right mid back pain radiating to the right abdomen, worse with movement for a few days. She's been using her medication, although not getting relief. She has abdominal discomfort to palpation earlier, but it resolved. When seen in ED, she has evaluation consistent with musculoskeletal pain. ED imaging indicates acute to subacute compression fracture, T12. It is consistent with the site of her pain.  Medical screening examination/treatment/procedure(s) were conducted as a shared visit with non-physician practitioner(s) and myself.  I personally evaluated the patient during the encounter  Flint Melter, MD 10/24/11 [id number]

## 2011-10-24 NOTE — ED Notes (Signed)
Family x2 at [facility], pending arrival of [facility] for transport.

## 2011-10-24 NOTE — ED Notes (Addendum)
Daughter remains at bedside. At this time

## 2011-10-24 NOTE — Discharge Instructions (Signed)
Back, Compression Fracture  A compression fracture happens when a force is put upon the length of your spine. Slipping and falling on your bottom are examples of such a force. When this happens, sometimes the force is great enough to compress the building blocks (vertebral bodies) of your spine. Although this causes a lot of pain, this can usually be treated at home, unless your caregiver feels hospitalization is needed for pain control.  Your backbone (spinal column) is made up of 24 main vertebral bodies in addition to the sacrum and coccyx (see illustration). These are held together by tough fibrous tissues (ligaments) and by support of your muscles. Nerve roots pass through the openings between the vertebrae. A sudden wrenching move, injury, or a fall may cause a compression fracture of one of the vertebral bodies. This may result in back pain or spread of pain into the belly (abdomen), the buttocks, and down the leg into the foot. Pain may also be created by muscle spasm alone.  Large studies have been undertaken to determine the best possible course of action to help your back following injury and also to prevent future problems. The recommendations are as follows.  FOLLOWING A COMPRESSION FRACTURE:  Do the following only if advised by your caregiver.    If a back brace has been suggested or provided, wear it as directed.   DO NOT stop wearing the back brace unless instructed by your caregiver.   When allowed to return to regular activities, avoid a sedentary life style. Actively exercise. Sporadic weekend binges of tennis, racquetball, water skiing, may actually aggravate or create problems, especially if you are not in condition for that activity.   Avoid sports requiring sudden body movements until you are in condition for them. Swimming and walking are safer activities.   Maintain good posture.   Avoid obesity.   If not already done, you should have a DEXA scan. Based on the results, be treated for  osteoporosis.  FOLLOWING ACUTE (SUDDEN) INJURY:   Only take over-the-counter or prescription medicines for pain, discomfort, or fever as directed by your caregiver.   Use bed rest for only the most extreme acute episode. Prolonged bed rest may aggravate your condition. Ice used for acute conditions is effective. Use a large plastic bag filled with ice. Wrap it in a towel. This also provides excellent pain relief. This may be continuous. Or use it for 30 minutes every 2 hours during acute phase, then as needed. Heat for 30 minutes prior to activities is helpful.   As soon as the acute phase (the time when your back is too painful for you to do normal activities) is over, it is important to resume normal activities and work hardening programs. Back injuries can cause potentially marked changes in lifestyle. So it is important to attack these problems aggressively.   See your caregiver for continued problems. He or she can help or refer you for appropriate exercises, physical therapy and work hardening if needed.   If you are given narcotic medications for your condition, for the next 24 hours DO NOT:   Drive   Operate machinery or power tools.   Sign legal documents.   DO NOT drink alcohol, take sleeping pills or other medications that may interfere with treatment.  If your caregiver has given you a follow-up appointment, it is very important to keep that appointment. Not keeping the appointment could result in a chronic or permanent injury, pain, and disability. If there is any   back to this facility for assistance.  SEEK IMMEDIATE MEDICAL CARE IF:  You develop numbness, tingling, weakness, or problems with the use of your arms or legs.   You develop severe back pain not relieved with medications.   You have changes in bowel or bladder control.   You have increasing pain in any areas of the body.  Document Released: 06/29/2005  Document Revised: 06/18/2011 Document Reviewed: 02/01/2008 [facility] Patient Information 2012 [place], [place].Vertebral Fracture You have a fracture of one or more vertebra. These are the bony parts that form the spine. Minor vertebral fractures happen when people fall. Osteoporosis is associated with many of these fractures. Hospital care may not be necessary for minor compression fractures that are stable. However, multiple fractures of the spine or unstable injuries can cause severe pain and even damage the spinal cord. A spinal cord injury may cause paralysis, numbness, or loss of normal bowel and bladder control.  Normally there is pain and stiffness in the back for 3 to 6 weeks after a vertebral fracture. Bed rest for several days, pain medicine, and a slow return to activity is often the only treatment that is needed depending on the location of the fracture. Neck and back braces may be helpful in reducing pain and increasing mobility. When your pain allows, you should begin walking or swimming to help maintain your endurance. Exercises to improve motion and to strengthen the back may also be useful after the initial pain improves. Treatment for osteoporosis may be essential for full recovery. This will help reduce your risk of vertebral fractures with a future fall. During the first few days after a spine fracture you may feel nauseated or vomit. If this is severe, hospital care with IV fluids will be needed.  Arrange for follow-up care as recommended to assure proper long-term care and prevention of further spine injury.  SEEK IMMEDIATE MEDICAL CARE IF:  You have increasing pain, vomiting, or are unable to move around at all.   You develop numbness, tingling, weakness, or paralysis of any part of your body.   You develop a loss of normal bowel or bladder control.   You have difficulty breathing, cough, fever, chest or abdominal pain.  MAKE SURE YOU:   Understand these instructions.   Will  watch your condition.   Will get help right away if you are not doing well or get worse.  Document Released: 08/06/2004 Document Revised: 06/18/2011 Document Reviewed: 02/19/2009 [facility] Patient Information 2012 [place], [place].

## 2011-10-24 NOTE — Telephone Encounter (Signed)
Seen and then to ER Has apparently new spinal compression fracture  Please make sure she plans to keep next week's scheduled appt I would like to review how she is doing

## 2011-10-25 LAB — URINE CULTURE

## 2011-10-26 NOTE — Telephone Encounter (Signed)
Noted If necessary, we can boost her analgesic regimen

## 2011-10-26 NOTE — Telephone Encounter (Signed)
Spoke with patient and advised results, she will keep her appt on Wednesday with Dr. Alphonsus Sias and pt will be seeing the PA at her orthopedist office on Thursday. Per pt she's still in a lot of pain, it 's being controlled with pain meds, pt can't sleep in her bed only in the recliner, per pt thanks for the call.

## 2011-10-28 ENCOUNTER — Encounter: Payer: Self-pay | Admitting: Internal Medicine

## 2011-10-28 ENCOUNTER — Ambulatory Visit (INDEPENDENT_AMBULATORY_CARE_PROVIDER_SITE_OTHER): Payer: Medicare Other | Admitting: Internal Medicine

## 2011-10-28 VITALS — BP 118/70 | HR 80 | Temp 98.6°F | Wt 173.0 lb

## 2011-10-28 DIAGNOSIS — I5022 Chronic systolic (congestive) heart failure: Secondary | ICD-10-CM

## 2011-10-28 DIAGNOSIS — S22009A Unspecified fracture of unspecified thoracic vertebra, initial encounter for closed fracture: Secondary | ICD-10-CM

## 2011-10-28 DIAGNOSIS — K5903 Drug induced constipation: Secondary | ICD-10-CM

## 2011-10-28 DIAGNOSIS — S22080A Wedge compression fracture of T11-T12 vertebra, initial encounter for closed fracture: Secondary | ICD-10-CM

## 2011-10-28 DIAGNOSIS — E785 Hyperlipidemia, unspecified: Secondary | ICD-10-CM

## 2011-10-28 DIAGNOSIS — R82998 Other abnormal findings in urine: Secondary | ICD-10-CM

## 2011-10-28 DIAGNOSIS — R8271 Bacteriuria: Secondary | ICD-10-CM

## 2011-10-28 DIAGNOSIS — K5909 Other constipation: Secondary | ICD-10-CM

## 2011-10-28 HISTORY — DX: Wedge compression fracture of T11-T12 vertebra, initial encounter for closed fracture: S22.080A

## 2011-10-28 LAB — LIPID PANEL
Cholesterol: 128 mg/dL (ref 0–200)
LDL Cholesterol: 56 mg/dL (ref 0–99)
Total CHOL/HDL Ratio: 3
VLDL: 26.6 mg/dL (ref 0.0–40.0)

## 2011-10-28 NOTE — Patient Instructions (Signed)
Please start senekot-S 2 tablets twice a day today and then daily starting tomorrow. Please use miralax (polyethylene glycol), 1 capful with 8 ounces of water. Try three times a day for today and tomorrow or till you starting opening up. You will probably need daily or twice a day on a regular basis to keep your bowels open

## 2011-10-28 NOTE — Assessment & Plan Note (Signed)
Will start senekot-s and miralax and titrate depending on analgesic needs

## 2011-10-28 NOTE — Assessment & Plan Note (Signed)
No symptoms now Culture not positive No Rx

## 2011-10-28 NOTE — Progress Notes (Signed)
Subjective:    Patient ID: Haley Harris, female    DOB: Nov 13, 1928, 78 y.o.   MRN: [id number]  HPI Here with daughter, Haley Harris  T12 compression fracture diagnosed 5 days ago Now on percocet 2 up to every 4 hours Dr Otelia Sergeant is managing the pain meds Terrible constipation now---tried multiple OTC meds but often had cramps  Plans to go back to the endocrinologist, Dr Valarie Cones Has been off boniva for about 2 years May consider prolia  Walking with rollator Ice packs help also Did take shower this am---not so easy Dr Otelia Sergeant ordered home PT and aides for bathing  Current Outpatient Prescriptions on File Prior to Visit  Medication Sig Dispense Refill  . albuterol (PROVENTIL) (2.5 MG/3ML) 0.083% nebulizer solution Take 2.5 mg by nebulization every 6 (six) hours as needed. For shortness of breath      . aspirin 81 MG tablet Take 81 mg by mouth daily.       . Calcium Carbonate-Vitamin D (CALTRATE 600+D PO) Take 1 tablet by mouth daily.       . carvedilol (COREG) 12.5 MG tablet Take 1 tablet (12.5 mg total) by mouth 2 (two) times daily with a meal.  60 tablet  11  . cetirizine (ZYRTEC) 10 MG tablet Take 10 mg by mouth 2 (two) times daily as needed. For allergies      . cyclobenzaprine (FLEXERIL) 5 MG tablet Take 5 mg by mouth every 6 (six) hours as needed. For pain      . FLUoxetine (PROZAC) 20 MG capsule Take 20 mg by mouth daily.        . fluticasone (FLONASE) 50 MCG/ACT nasal spray Place 2 sprays into the nose daily.        . furosemide (LASIX) 40 MG tablet Take 40 mg by mouth daily.        Marland Kitchen gabapentin (NEURONTIN) 100 MG capsule Take 100 mg by mouth 2 (two) times daily.      Marland Kitchen losartan (COZAAR) 50 MG tablet Take 1 tablet (50 mg total) by mouth daily.  30 tablet  3  . montelukast (SINGULAIR) 10 MG tablet Take 1 tablet (10 mg total) by mouth daily as needed. For severe resistant allergy symptoms  30 tablet  11  . Multiple Vitamin (MULITIVITAMIN WITH MINERALS) TABS Take 1 tablet by mouth daily.         Marland Kitchen omeprazole (PRILOSEC) 20 MG capsule Take 20 mg by mouth 2 (two) times daily.        . pravastatin (PRAVACHOL) 80 MG tablet Take 80 mg by mouth at bedtime.        Marland Kitchen spironolactone (ALDACTONE) 25 MG tablet Take 1 tablet (25 mg total) by mouth daily.  30 tablet  3  . Ticagrelor (BRILINTA) 90 MG TABS tablet Take 1 tablet (90 mg total) by mouth 2 (two) times daily.  60 tablet  11  . vitamin B-12 (CYANOCOBALAMIN) 1000 MCG tablet Take 1,000 mcg by mouth 3 (three) times a week. Take mon,wed,and fri.      . DISCONTD: FLUoxetine (PROZAC) 20 MG capsule Take 1 capsule (20 mg total) by mouth daily.  90 capsule  3  . DISCONTD: furosemide (LASIX) 40 MG tablet Take 1 tablet (40 mg total) by mouth daily.  30 tablet  0  . DISCONTD: losartan (COZAAR) 50 MG tablet Take 1 tablet (50 mg total) by mouth daily.  30 tablet  0  . DISCONTD: spironolactone (ALDACTONE) 25 MG tablet Take 1 tablet (25 mg  total) by mouth daily.  30 tablet  0    Allergies  Allergen Reactions  . Augmentin Anaphylaxis  . Amoxicillin     REACTION: Angioedema requiring intubation  . Cephalexin Other (See Comments)    Reaction unknown  . Clarithromycin Other (See Comments)    Reaction unknown  . Nifedipine Other (See Comments)    Reaction unknown  . Olmesartan Medoxomil     REACTION: cough  . Tramadol Hcl Other (See Comments)    Reaction unknown    Past Medical History  Diagnosis Date  . Allergic rhinitis, cause unspecified   . Unspecified cardiovascular disease   . Personal history of malignant neoplasm of breast   . Occlusion and stenosis of carotid artery without mention of cerebral infarction   . Chronic airway obstruction, not elsewhere classified   . Depressive disorder, not elsewhere classified   . Other dyspnea and respiratory abnormality   . Esophageal reflux   . Unspecified hearing loss   . Other and unspecified hyperlipidemia   . Unspecified essential hypertension   . Malignant neoplasm of breast (female),  unspecified site   . Osteoarthrosis, unspecified whether generalized or localized, unspecified site   . Osteoporosis, unspecified   . Peripheral vascular disease, unspecified   . Unspecified urinary incontinence   . Spinal stenosis   . ACE-inhibitor cough   . Angina   . Heart murmur     aS CHILD  . Shortness of breath   . Recurrent upper respiratory infection (URI)   . Anxiety   . Pneumonia   . 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 Myocardial infarct     Past Surgical History  Procedure Date  . Mastectomy 1987    Right  . Breast reconstruction 1998    Reconstruction   . Reduction mammaplasty 1998    Left  . Mastoidectomy childhood  . Appendectomy 1948  . Lumbar laminectomy 2003  . Shoulder surgery 02/2005    Bilateral fractures with multiple surgeries  . Breast implant removal 06/12/09    right  . Abdominal hysterectomy   . Fracture surgery     fracture right elbow  . Coronary angioplasty 11/12    distal RCA  . Mastectomy     Family History  Problem Relation Age of Onset  . Heart attack Father 27  . Cancer Mother     ovarian  . Cancer Brother     lung  . Arthritis      family    History   Social History  . Marital Status: Widowed    Spouse Name: N/A    Number of Children: 2  . Years of Education: N/A   Occupational History  . 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     Social History Main Topics  . Smoking status: Former Smoker    Quit date: 07/13/1974  . Smokeless tobacco: Never Used  . Alcohol Use: Yes     occasional  . Drug Use: No  . Sexually Active: No   Other Topics Concern  . Not on file   Social History Narrative  . No narrative on file   Review of Systems Appetite is gone Daughter is there with her    Objective:   Physical Exam  Constitutional: She appears well-developed and well-nourished. No distress.  Abdominal: Soft. Bowel sounds are normal. She exhibits distension. There is no tenderness.           Assessment &  Plan:

## 2011-10-28 NOTE — Assessment & Plan Note (Signed)
No problems with meds Will check labs 

## 2011-10-28 NOTE — Assessment & Plan Note (Signed)
Dr Otelia Sergeant is managing

## 2011-10-28 NOTE — Assessment & Plan Note (Signed)
Reports EF of 45% on recent echo No specific dyspnea

## 2011-10-29 ENCOUNTER — Other Ambulatory Visit: Payer: Self-pay | Admitting: *Deleted

## 2011-10-29 MED ORDER — FUROSEMIDE 40 MG PO TABS: 40.0000 mg | ORAL_TABLET | Freq: Every day | ORAL | Status: DC

## 2011-11-02 ENCOUNTER — Encounter: Payer: Self-pay | Admitting: *Deleted

## 2011-11-30 ENCOUNTER — Other Ambulatory Visit: Payer: Self-pay | Admitting: *Deleted

## 2011-11-30 MED ORDER — LOSARTAN POTASSIUM 50 MG PO TABS: 50.0000 mg | ORAL_TABLET | Freq: Every day | ORAL | Status: DC

## 2011-12-04 ENCOUNTER — Other Ambulatory Visit (HOSPITAL_COMMUNITY): Payer: Self-pay | Admitting: Family Medicine

## 2011-12-04 DIAGNOSIS — C50919 Malignant neoplasm of unspecified site of unspecified female breast: Secondary | ICD-10-CM

## 2011-12-04 DIAGNOSIS — IMO0002 Reserved for concepts with insufficient information to code with codable children: Secondary | ICD-10-CM

## 2011-12-09 ENCOUNTER — Encounter (HOSPITAL_COMMUNITY): Payer: Medicare Other

## 2011-12-09 ENCOUNTER — Ambulatory Visit (HOSPITAL_COMMUNITY): Payer: Medicare Other

## 2011-12-10 ENCOUNTER — Other Ambulatory Visit (HOSPITAL_COMMUNITY): Payer: Self-pay | Admitting: *Deleted

## 2011-12-10 DIAGNOSIS — IMO0002 Reserved for concepts with insufficient information to code with codable children: Secondary | ICD-10-CM

## 2011-12-10 DIAGNOSIS — C50919 Malignant neoplasm of unspecified site of unspecified female breast: Secondary | ICD-10-CM

## 2011-12-11 ENCOUNTER — Encounter (HOSPITAL_COMMUNITY)
Admission: RE | Admit: 2011-12-11 | Discharge: 2011-12-11 | Disposition: A | Discharge status: Home / Self Care | Payer: Medicare Other | Source: Ambulatory Visit | Attending: Family Medicine | Admitting: Family Medicine

## 2011-12-11 DIAGNOSIS — IMO0002 Reserved for concepts with insufficient information to code with codable children: Secondary | ICD-10-CM

## 2011-12-11 DIAGNOSIS — C50919 Malignant neoplasm of unspecified site of unspecified female breast: Secondary | ICD-10-CM | POA: Insufficient documentation

## 2011-12-11 MED ORDER — TECHNETIUM TC 99M MEDRONATE IV KIT
25.0000 | PACK | Freq: Once | INTRAVENOUS | Status: AC | PRN
  Administered 2011-12-11: 25 via INTRAVENOUS

## 2011-12-24 ENCOUNTER — Ambulatory Visit (INDEPENDENT_AMBULATORY_CARE_PROVIDER_SITE_OTHER): Payer: Medicare Other | Admitting: Internal Medicine

## 2011-12-24 ENCOUNTER — Encounter: Payer: Self-pay | Admitting: Internal Medicine

## 2011-12-24 VITALS — BP 122/62 | HR 73 | Temp 98.4°F | Wt 165.0 lb

## 2011-12-24 DIAGNOSIS — M810 Age-related osteoporosis without current pathological fracture: Secondary | ICD-10-CM

## 2011-12-24 NOTE — Progress Notes (Signed)
Subjective:    Patient ID: Haley Harris, female    DOB: 1929/01/30, 78 y.o.   MRN: [id number]  HPI Here with daughter Eric Form to discuss prolia Dr Valarie Cones her endocrinologist has recommended prolia  Current Outpatient Prescriptions on File Prior to Visit  Medication Sig Dispense Refill  . albuterol (PROVENTIL) (2.5 MG/3ML) 0.083% nebulizer solution Take 2.5 mg by nebulization every 6 (six) hours as needed. For shortness of breath      . aspirin 81 MG tablet Take 81 mg by mouth daily.       . Calcium Carbonate-Vitamin D (CALTRATE 600+D PO) Take 1 tablet by mouth daily.       . carvedilol (COREG) 12.5 MG tablet Take 1 tablet (12.5 mg total) by mouth 2 (two) times daily with a meal.  60 tablet  11  . cetirizine (ZYRTEC) 10 MG tablet Take 10 mg by mouth 2 (two) times daily as needed. For allergies      . cyclobenzaprine (FLEXERIL) 5 MG tablet Take 5 mg by mouth every 6 (six) hours as needed. For pain      . FLUoxetine (PROZAC) 20 MG capsule Take 20 mg by mouth daily.        . fluticasone (FLONASE) 50 MCG/ACT nasal spray Place 2 sprays into the nose daily.        . furosemide (LASIX) 40 MG tablet Take 1 tablet (40 mg total) by mouth daily.  30 tablet  11  . gabapentin (NEURONTIN) 100 MG capsule Take 100 mg by mouth 2 (two) times daily.      Marland Kitchen losartan (COZAAR) 50 MG tablet Take 1 tablet (50 mg total) by mouth daily.  30 tablet  6  . montelukast (SINGULAIR) 10 MG tablet Take 1 tablet (10 mg total) by mouth daily as needed. For severe resistant allergy symptoms  30 tablet  11  . Multiple Vitamin (MULITIVITAMIN WITH MINERALS) TABS Take 1 tablet by mouth daily.        Marland Kitchen omeprazole (PRILOSEC) 20 MG capsule Take 20 mg by mouth 2 (two) times daily.        Marland Kitchen oxyCODONE-acetaminophen (PERCOCET) 5-325 MG per tablet Take 2 tablets by mouth every 4 (four) hours as needed.      . pravastatin (PRAVACHOL) 80 MG tablet Take 80 mg by mouth at bedtime.        Marland Kitchen spironolactone (ALDACTONE) 25 MG tablet Take 1  tablet (25 mg total) by mouth daily.  30 tablet  3  . Ticagrelor (BRILINTA) 90 MG TABS tablet Take 1 tablet (90 mg total) by mouth 2 (two) times daily.  60 tablet  11  . vitamin B-12 (CYANOCOBALAMIN) 1000 MCG tablet Take 1,000 mcg by mouth 3 (three) times a week. Take mon,wed,and fri.      . DISCONTD: FLUoxetine (PROZAC) 20 MG capsule Take 1 capsule (20 mg total) by mouth daily.  90 capsule  3  . DISCONTD: furosemide (LASIX) 40 MG tablet Take 1 tablet (40 mg total) by mouth daily.  30 tablet  0  . DISCONTD: losartan (COZAAR) 50 MG tablet Take 1 tablet (50 mg total) by mouth daily.  30 tablet  0  . DISCONTD: spironolactone (ALDACTONE) 25 MG tablet Take 1 tablet (25 mg total) by mouth daily.  30 tablet  0    Allergies  Allergen Reactions  . Amoxicillin-Pot Clavulanate Anaphylaxis  . Amoxicillin     REACTION: Angioedema requiring intubation  . Cephalexin Other (See Comments)    Reaction unknown  .  Clarithromycin Other (See Comments)    Reaction unknown  . Nifedipine Other (See Comments)    Reaction unknown  . Olmesartan Medoxomil     REACTION: cough  . Tramadol Hcl Other (See Comments)    Reaction unknown    Past Medical History  Diagnosis Date  . Allergic rhinitis, cause unspecified   . Unspecified cardiovascular disease   . Personal history of malignant neoplasm of breast   . Occlusion and stenosis of carotid artery without mention of cerebral infarction   . Chronic airway obstruction, not elsewhere classified   . Depressive disorder, not elsewhere classified   . Other dyspnea and respiratory abnormality   . Esophageal reflux   . Unspecified hearing loss   . Other and unspecified hyperlipidemia   . Unspecified essential hypertension   . Malignant neoplasm of breast (female), unspecified site   . Osteoarthrosis, unspecified whether generalized or localized, unspecified site   . Osteoporosis, unspecified   . Peripheral vascular disease, unspecified   . Unspecified urinary  incontinence   . Spinal stenosis   . ACE-inhibitor cough   . Angina   . Heart murmur     aS CHILD  . Shortness of breath   . Recurrent upper respiratory infection (URI)   . Anxiety   . Pneumonia   . 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 Myocardial infarct     Past Surgical History  Procedure Date  . Mastectomy 1987    Right  . Breast reconstruction 1998    Reconstruction   . Reduction mammaplasty 1998    Left  . Mastoidectomy childhood  . Appendectomy 1948  . Lumbar laminectomy 2003  . Shoulder surgery 02/2005    Bilateral fractures with multiple surgeries  . Breast implant removal 06/12/09    right  . Abdominal hysterectomy   . Fracture surgery     fracture right elbow  . Coronary angioplasty 11/12    distal RCA  . Mastectomy     Family History  Problem Relation Age of Onset  . Heart attack Father 26  . Cancer Mother     ovarian  . Cancer Brother     lung  . Arthritis      family    History   Social History  . Marital Status: Widowed    Spouse Name: N/A    Number of Children: 2  . Years of Education: N/A   Occupational History  . 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     Social History Main Topics  . Smoking status: Former Smoker    Quit date: 07/13/1974  . Smokeless tobacco: Never Used  . Alcohol Use: Yes     occasional  . Drug Use: No  . Sexually Active: No   Other Topics Concern  . Not on file   Social History Narrative  . No narrative on file   Review of Systems     Objective:   Physical Exam        Assessment & Plan:

## 2011-12-24 NOTE — Assessment & Plan Note (Signed)
Asks for second opinion Counseled ~15 minutes Agree with Dr Valarie Cones that aggressive treatment appropriate given 2 recent fractures Has used bisphosphonates and more not a good idea Will try to get the prolia approved

## 2011-12-26 ENCOUNTER — Other Ambulatory Visit: Payer: Self-pay | Admitting: Internal Medicine

## 2011-12-31 ENCOUNTER — Telehealth: Payer: Self-pay | Admitting: *Deleted

## 2011-12-31 NOTE — Telephone Encounter (Signed)
Okay Can just order it and plan to give at her August visit

## 2011-12-31 NOTE — Telephone Encounter (Signed)
OK 

## 2011-12-31 NOTE — Telephone Encounter (Signed)
Spoke with about pushing up her appt in August so that she can received her prolia earlier, per pt she's going to the beach in July and would rather wait until her office visit in August, pt states if she have any complications she would rather it be after the beach. ( see prolia summary of benefits scanned in) pt covered under office visit.

## 2012-01-07 ENCOUNTER — Encounter (INDEPENDENT_AMBULATORY_CARE_PROVIDER_SITE_OTHER): Payer: Medicare Other

## 2012-01-07 ENCOUNTER — Other Ambulatory Visit: Payer: Self-pay | Admitting: Cardiology

## 2012-01-07 DIAGNOSIS — I6529 Occlusion and stenosis of unspecified carotid artery: Secondary | ICD-10-CM

## 2012-01-18 ENCOUNTER — Encounter: Payer: Self-pay | Admitting: *Deleted

## 2012-02-29 ENCOUNTER — Other Ambulatory Visit: Payer: Self-pay | Admitting: *Deleted

## 2012-02-29 MED ORDER — FLUOXETINE HCL 20 MG PO CAPS: 20.0000 mg | ORAL_CAPSULE | Freq: Every day | ORAL | Status: DC

## 2012-03-02 ENCOUNTER — Ambulatory Visit (INDEPENDENT_AMBULATORY_CARE_PROVIDER_SITE_OTHER): Payer: Medicare Other | Admitting: Internal Medicine

## 2012-03-02 ENCOUNTER — Other Ambulatory Visit: Payer: Self-pay | Admitting: *Deleted

## 2012-03-02 ENCOUNTER — Encounter: Payer: Self-pay | Admitting: Internal Medicine

## 2012-03-02 VITALS — BP 118/70 | HR 73 | Temp 98.3°F | Ht 66.0 in | Wt 164.0 lb

## 2012-03-02 DIAGNOSIS — J4489 Other specified chronic obstructive pulmonary disease: Secondary | ICD-10-CM

## 2012-03-02 DIAGNOSIS — S22080A Wedge compression fracture of T11-T12 vertebra, initial encounter for closed fracture: Secondary | ICD-10-CM

## 2012-03-02 DIAGNOSIS — E785 Hyperlipidemia, unspecified: Secondary | ICD-10-CM

## 2012-03-02 DIAGNOSIS — I10 Essential (primary) hypertension: Secondary | ICD-10-CM

## 2012-03-02 DIAGNOSIS — S22009A Unspecified fracture of unspecified thoracic vertebra, initial encounter for closed fracture: Secondary | ICD-10-CM

## 2012-03-02 DIAGNOSIS — J449 Chronic obstructive pulmonary disease, unspecified: Secondary | ICD-10-CM

## 2012-03-02 DIAGNOSIS — M810 Age-related osteoporosis without current pathological fracture: Secondary | ICD-10-CM

## 2012-03-02 DIAGNOSIS — I5022 Chronic systolic (congestive) heart failure: Secondary | ICD-10-CM

## 2012-03-02 MED ORDER — FLUOXETINE HCL 20 MG PO CAPS: 20.0000 mg | ORAL_CAPSULE | Freq: Every day | ORAL | Status: DC

## 2012-03-02 MED ORDER — DENOSUMAB 60 MG/ML ~~LOC~~ SOLN
60.0000 mg | Freq: Once | SUBCUTANEOUS | Status: AC
  Administered 2012-03-02: 60 mg via SUBCUTANEOUS

## 2012-03-02 NOTE — Progress Notes (Signed)
Subjective:    Patient ID: Haley Harris, female    DOB: Dec 05, 1928, 78 y.o.   MRN: [id number]  HPI Doing well One concerning area on left forearm---just a seborrheic keratosis  Recently saw Dr Harwani--cardiologist Everything is fine Has been changed to plavix Omeprazole changed to pepcid due to potential interaction  Weighs herself daily Has been stable Gets DOE-- variable exercise tolerance Does use the albuterol nebulizer at times for wheezing. Seems to be dependent on the weather No edema No palpitations No chest pain--hasn't needed NTG at all Able to stand more in church though Occ wheezing  Still some back pain Starting prolia today  Current Outpatient Prescriptions on File Prior to Visit  Medication Sig Dispense Refill  . albuterol (PROVENTIL) (2.5 MG/3ML) 0.083% nebulizer solution Take 2.5 mg by nebulization every 6 (six) hours as needed. For shortness of breath      . aspirin 81 MG tablet Take 81 mg by mouth daily.       . Calcium Carbonate-Vitamin D (CALTRATE 600+D PO) Take 1 tablet by mouth daily.       . carvedilol (COREG) 12.5 MG tablet Take 1 tablet (12.5 mg total) by mouth 2 (two) times daily with a meal.  60 tablet  11  . cetirizine (ZYRTEC) 10 MG tablet Take 10 mg by mouth 2 (two) times daily as needed. For allergies      . cyclobenzaprine (FLEXERIL) 5 MG tablet Take 5 mg by mouth every 6 (six) hours as needed. For pain      . famotidine (PEPCID) 20 MG tablet Take 20 mg by mouth 2 (two) times daily.       Marland Kitchen FLUoxetine (PROZAC) 20 MG capsule Take 1 capsule (20 mg total) by mouth daily.  30 capsule  11  . fluticasone (FLONASE) 50 MCG/ACT nasal spray Place 2 sprays into the nose daily.        . furosemide (LASIX) 40 MG tablet Take 1 tablet (40 mg total) by mouth daily.  30 tablet  11  . gabapentin (NEURONTIN) 100 MG capsule Take 100 mg by mouth 2 (two) times daily.      Marland Kitchen losartan (COZAAR) 50 MG tablet Take 1 tablet (50 mg total) by mouth daily.  30 tablet  6  .  montelukast (SINGULAIR) 10 MG tablet Take 1 tablet (10 mg total) by mouth daily as needed. For severe resistant allergy symptoms  30 tablet  11  . Multiple Vitamin (MULITIVITAMIN WITH MINERALS) TABS Take 1 tablet by mouth daily.        Marland Kitchen oxyCODONE-acetaminophen (PERCOCET) 5-325 MG per tablet Take 2 tablets by mouth every 4 (four) hours as needed.      . pravastatin (PRAVACHOL) 80 MG tablet Take 80 mg by mouth at bedtime.        Marland Kitchen spironolactone (ALDACTONE) 25 MG tablet TAKE ONE (1) TABLET BY MOUTH EACH DAY  30 tablet  11  . vitamin B-12 (CYANOCOBALAMIN) 1000 MCG tablet Take 1,000 mcg by mouth 3 (three) times a week. Take mon,wed,and fri.      . DISCONTD: furosemide (LASIX) 40 MG tablet Take 1 tablet (40 mg total) by mouth daily.  30 tablet  0  . DISCONTD: losartan (COZAAR) 50 MG tablet Take 1 tablet (50 mg total) by mouth daily.  30 tablet  0  . DISCONTD: spironolactone (ALDACTONE) 25 MG tablet Take 1 tablet (25 mg total) by mouth daily.  30 tablet  0    Allergies  Allergen Reactions  .  Amoxicillin-Pot Clavulanate Anaphylaxis  . Amoxicillin     REACTION: Angioedema requiring intubation  . Cephalexin Other (See Comments)    Reaction unknown  . Clarithromycin Other (See Comments)    Reaction unknown  . Nifedipine Other (See Comments)    Reaction unknown  . Olmesartan Medoxomil     REACTION: cough  . Tramadol Hcl Other (See Comments)    Reaction unknown    Past Medical History  Diagnosis Date  . Allergic rhinitis, cause unspecified   . Unspecified cardiovascular disease   . Personal history of malignant neoplasm of breast   . Occlusion and stenosis of carotid artery without mention of cerebral infarction   . Chronic airway obstruction, not elsewhere classified   . Depressive disorder, not elsewhere classified   . Other dyspnea and respiratory abnormality   . Esophageal reflux   . Unspecified hearing loss   . Other and unspecified hyperlipidemia   . Unspecified essential  hypertension   . Malignant neoplasm of breast (female), unspecified site   . Osteoarthrosis, unspecified whether generalized or localized, unspecified site   . Osteoporosis, unspecified   . Peripheral vascular disease, unspecified   . Unspecified urinary incontinence   . Spinal stenosis   . ACE-inhibitor cough   . Angina   . Heart murmur     aS CHILD  . Shortness of breath   . Recurrent upper respiratory infection (URI)   . Anxiety   . Pneumonia   . 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 Myocardial infarct     Past Surgical History  Procedure Date  . Mastectomy 1987    Right  . Breast reconstruction 1998    Reconstruction   . Reduction mammaplasty 1998    Left  . Mastoidectomy childhood  . Appendectomy 1948  . Lumbar laminectomy 2003  . Shoulder surgery 02/2005    Bilateral fractures with multiple surgeries  . Breast implant removal 06/12/09    right  . Abdominal hysterectomy   . Fracture surgery     fracture right elbow  . Coronary angioplasty 11/12    distal RCA  . Mastectomy     Family History  Problem Relation Age of Onset  . Heart attack Father 74  . Cancer Mother     ovarian  . Cancer Brother     lung  . Arthritis      family    History   Social History  . Marital Status: Widowed    Spouse Name: N/A    Number of Children: 2  . Years of Education: N/A   Occupational History  . 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     Social History Main Topics  . Smoking status: Former Smoker    Quit date: 07/13/1974  . Smokeless tobacco: Never Used  . Alcohol Use: Yes     occasional  . Drug Use: No  . Sexually Active: No   Other Topics Concern  . Not on file   Social History Narrative  . No narrative on file   Review of Systems Still tries to do water exercises--plans to join [employer]    Objective:   Physical Exam  Constitutional: She appears well-developed and well-nourished. No distress.  Neck: Normal range of motion. Neck  supple. No thyromegaly present.  Cardiovascular: Normal rate, regular rhythm and normal heart sounds.  Exam reveals no gallop.   No murmur heard.      Faint pedal pulses  Pulmonary/Chest: Effort normal and breath sounds normal. No respiratory distress. She has no wheezes. She has no rales.  Abdominal: Soft. There is no tenderness.  Musculoskeletal: She exhibits no edema and no tenderness.  Lymphadenopathy:    She has no cervical adenopathy.  Psychiatric: She has a normal mood and affect. Her behavior is normal. Thought content normal.          Assessment & Plan:

## 2012-03-02 NOTE — Assessment & Plan Note (Signed)
BP Readings from Last 3 Encounters:  03/02/12 118/70  12/24/11 122/62  10/28/11 118/70   Good control on CHF meds

## 2012-03-02 NOTE — Assessment & Plan Note (Signed)
Still with some pain Starting prolia today

## 2012-03-02 NOTE — Assessment & Plan Note (Signed)
She feels most of her DOE is related to lungs Uses the albuterol with good results

## 2012-03-02 NOTE — Assessment & Plan Note (Signed)
She is doing well Weight is stable Wants to get off some of her diuretics---I explained that she probably needs to continue Could consider adjusting based on weight---I would leave that to Dr Sharyn Lull

## 2012-03-02 NOTE — Assessment & Plan Note (Signed)
Lab Results  Component Value Date   LDLCALC 56 10/28/2011   At goal No changes

## 2012-03-02 NOTE — Addendum Note (Signed)
Addended by: Sueanne Margarita on: 03/02/2012 12:04 PM   Modules accepted: Orders

## 2012-03-17 ENCOUNTER — Other Ambulatory Visit: Payer: Self-pay | Admitting: *Deleted

## 2012-03-17 MED ORDER — PRAVASTATIN SODIUM 80 MG PO TABS: 80.0000 mg | ORAL_TABLET | Freq: Every day | ORAL | Status: DC

## 2012-05-02 ENCOUNTER — Ambulatory Visit (INDEPENDENT_AMBULATORY_CARE_PROVIDER_SITE_OTHER): Payer: Medicare Other

## 2012-05-02 DIAGNOSIS — Z23 Encounter for immunization: Secondary | ICD-10-CM

## 2012-05-03 ENCOUNTER — Other Ambulatory Visit (INDEPENDENT_AMBULATORY_CARE_PROVIDER_SITE_OTHER): Payer: Medicare Other

## 2012-05-03 DIAGNOSIS — Z79899 Other long term (current) drug therapy: Secondary | ICD-10-CM

## 2012-05-03 DIAGNOSIS — E7800 Pure hypercholesterolemia, unspecified: Secondary | ICD-10-CM

## 2012-05-03 LAB — LIPID PANEL
Cholesterol: 127 mg/dL (ref 0–200)
LDL Cholesterol: 53 mg/dL (ref 0–99)
Total CHOL/HDL Ratio: 2
VLDL: 14.4 mg/dL (ref 0.0–40.0)

## 2012-05-03 LAB — HEPATIC FUNCTION PANEL
Alkaline Phosphatase: 46 U/L (ref 39–117)
Bilirubin, Direct: 0 mg/dL (ref 0.0–0.3)
Total Bilirubin: 0.6 mg/dL (ref 0.3–1.2)

## 2012-05-03 LAB — BASIC METABOLIC PANEL
BUN: 40 mg/dL — ABNORMAL HIGH (ref 6–23)
CO2: 24 mEq/L (ref 19–32)
Chloride: 99 mEq/L (ref 96–112)
Creatinine, Ser: 1.3 mg/dL — ABNORMAL HIGH (ref 0.4–1.2)
Glucose, Bld: 76 mg/dL (ref 70–99)
Potassium: 5 mEq/L (ref 3.5–5.1)

## 2012-05-09 ENCOUNTER — Encounter: Payer: Self-pay | Admitting: *Deleted

## 2012-06-23 ENCOUNTER — Other Ambulatory Visit: Payer: Self-pay | Admitting: *Deleted

## 2012-06-23 MED ORDER — FLUTICASONE PROPIONATE 50 MCG/ACT NA SUSP: 2.0000 | Freq: Every day | NASAL | Status: DC

## 2012-07-25 ENCOUNTER — Other Ambulatory Visit: Payer: Self-pay | Admitting: *Deleted

## 2012-07-25 MED ORDER — LOSARTAN POTASSIUM 50 MG PO TABS: 50.0000 mg | ORAL_TABLET | Freq: Every day | ORAL | Status: DC

## 2012-08-23 ENCOUNTER — Other Ambulatory Visit: Payer: Self-pay | Admitting: *Deleted

## 2012-08-23 MED ORDER — CARVEDILOL 12.5 MG PO TABS: 12.5000 mg | ORAL_TABLET | Freq: Two times a day (BID) | ORAL | Status: DC

## 2012-08-30 ENCOUNTER — Other Ambulatory Visit: Payer: Self-pay | Admitting: *Deleted

## 2012-08-30 MED ORDER — MONTELUKAST SODIUM 10 MG PO TABS: 10.0000 mg | ORAL_TABLET | Freq: Every day | ORAL | Status: DC | PRN

## 2012-09-02 ENCOUNTER — Encounter: Payer: Self-pay | Admitting: Internal Medicine

## 2012-09-02 ENCOUNTER — Ambulatory Visit (INDEPENDENT_AMBULATORY_CARE_PROVIDER_SITE_OTHER): Payer: Medicare Other | Admitting: Internal Medicine

## 2012-09-02 VITALS — BP 112/78 | HR 71 | Temp 98.6°F | Wt 166.0 lb

## 2012-09-02 DIAGNOSIS — I2510 Atherosclerotic heart disease of native coronary artery without angina pectoris: Secondary | ICD-10-CM

## 2012-09-02 DIAGNOSIS — I10 Essential (primary) hypertension: Secondary | ICD-10-CM

## 2012-09-02 DIAGNOSIS — M810 Age-related osteoporosis without current pathological fracture: Secondary | ICD-10-CM

## 2012-09-02 DIAGNOSIS — F3289 Other specified depressive episodes: Secondary | ICD-10-CM

## 2012-09-02 DIAGNOSIS — J4489 Other specified chronic obstructive pulmonary disease: Secondary | ICD-10-CM

## 2012-09-02 DIAGNOSIS — I5022 Chronic systolic (congestive) heart failure: Secondary | ICD-10-CM

## 2012-09-02 DIAGNOSIS — S22080A Wedge compression fracture of T11-T12 vertebra, initial encounter for closed fracture: Secondary | ICD-10-CM

## 2012-09-02 DIAGNOSIS — F329 Major depressive disorder, single episode, unspecified: Secondary | ICD-10-CM

## 2012-09-02 MED ORDER — DENOSUMAB 60 MG/ML ~~LOC~~ SOLN
60.0000 mg | Freq: Once | SUBCUTANEOUS | Status: AC
  Administered 2012-09-02: 60 mg via SUBCUTANEOUS

## 2012-09-02 NOTE — Assessment & Plan Note (Signed)
Probably the limiting factor for her active No active problems though

## 2012-09-02 NOTE — Assessment & Plan Note (Signed)
Has been quiet Continues to follow with cardiologist

## 2012-09-02 NOTE — Assessment & Plan Note (Signed)
Mood is stable Will continue the med

## 2012-09-02 NOTE — Assessment & Plan Note (Signed)
BP Readings from Last 3 Encounters:  09/02/12 112/78  03/02/12 118/70  12/24/11 122/62   Good control

## 2012-09-02 NOTE — Assessment & Plan Note (Signed)
Getting 2nd prolia shot today Still needs percocet regularly for pain

## 2012-09-02 NOTE — Progress Notes (Signed)
Subjective:    Patient ID: Haley Harris, female    DOB: 29-Aug-1928, 79 y.o.   MRN: [id number]  HPI Doing well Dr Sharyn Lull has taken her off the plavix Just on the oxygen  No chest pain Does get SOB----gets rhinorrhea, eye itching, etc despite all her meds Relates to allergies and COPD though No chronic cough  Still takes percocet --mostly at bedtime and occ in day For knees and back From Dr Otelia Sergeant  Monitors weight daily Slight increase since holidays but generally stable Continues on the diuretic Rare ankle edema---if dietary indiscretion Stable 2 pillow orthopnea No PND but up  Mood has been pretty good Gets lonely and moody at times but no persistent depressed mood (daughter and SIL live with her but they do their separate things)  Current Outpatient Prescriptions on File Prior to Visit  Medication Sig Dispense Refill  . albuterol (PROVENTIL) (2.5 MG/3ML) 0.083% nebulizer solution Take 2.5 mg by nebulization every 6 (six) hours as needed. For shortness of breath      . aspirin 81 MG tablet Take 81 mg by mouth daily.       . Calcium Carbonate-Vitamin D (CALTRATE 600+D PO) Take 1 tablet by mouth daily.       . carvedilol (COREG) 12.5 MG tablet Take 1 tablet (12.5 mg total) by mouth 2 (two) times daily with a meal.  60 tablet  11  . cetirizine (ZYRTEC) 10 MG tablet Take 10 mg by mouth 2 (two) times daily as needed. For allergies      . cyclobenzaprine (FLEXERIL) 5 MG tablet Take 5 mg by mouth every 6 (six) hours as needed. For pain      . famotidine (PEPCID) 20 MG tablet Take 20 mg by mouth 2 (two) times daily.       Marland Kitchen FLUoxetine (PROZAC) 20 MG capsule Take 1 capsule (20 mg total) by mouth daily.  30 capsule  11  . fluticasone (FLONASE) 50 MCG/ACT nasal spray Place 2 sprays into the nose daily.  16 g  3  . furosemide (LASIX) 40 MG tablet Take 1 tablet (40 mg total) by mouth daily.  30 tablet  11  . gabapentin (NEURONTIN) 100 MG capsule Take 100 mg by mouth 2 (two) times daily.       Marland Kitchen losartan (COZAAR) 50 MG tablet Take 1 tablet (50 mg total) by mouth daily.  30 tablet  6  . montelukast (SINGULAIR) 10 MG tablet Take 1 tablet (10 mg total) by mouth daily as needed. For severe resistant allergy symptoms  30 tablet  5  . Multiple Vitamin (MULITIVITAMIN WITH MINERALS) TABS Take 1 tablet by mouth daily.        Marland Kitchen oxyCODONE-acetaminophen (PERCOCET) 5-325 MG per tablet Take 2 tablets by mouth every 4 (four) hours as needed.      . pravastatin (PRAVACHOL) 80 MG tablet Take 1 tablet (80 mg total) by mouth at bedtime.  90 tablet  3  . spironolactone (ALDACTONE) 25 MG tablet TAKE ONE (1) TABLET BY MOUTH EACH DAY  30 tablet  11  . vitamin B-12 (CYANOCOBALAMIN) 1000 MCG tablet Take 1,000 mcg by mouth 3 (three) times a week. Take mon,wed,and fri.       No current facility-administered medications on file prior to visit.    Allergies  Allergen Reactions  . Amoxicillin-Pot Clavulanate Anaphylaxis  . Amoxicillin     REACTION: Angioedema requiring intubation  . Cephalexin Other (See Comments)    Reaction unknown  . Clarithromycin  Other (See Comments)    Reaction unknown  . Nifedipine Other (See Comments)    Reaction unknown  . Olmesartan Medoxomil     REACTION: cough  . Tramadol Hcl Other (See Comments)    Reaction unknown    Past Medical History  Diagnosis Date  . Allergic rhinitis, cause unspecified   . Unspecified cardiovascular disease   . Personal history of malignant neoplasm of breast   . Occlusion and stenosis of carotid artery without mention of cerebral infarction   . Chronic airway obstruction, not elsewhere classified   . Depressive disorder, not elsewhere classified   . Other dyspnea and respiratory abnormality   . Esophageal reflux   . Unspecified hearing loss   . Other and unspecified hyperlipidemia   . Unspecified essential hypertension   . Malignant neoplasm of breast (female), unspecified site   . Osteoarthrosis, unspecified whether generalized or  localized, unspecified site   . Osteoporosis, unspecified   . Peripheral vascular disease, unspecified   . Unspecified urinary incontinence   . Spinal stenosis   . ACE-inhibitor cough   . Angina   . Heart murmur     aS CHILD  . Shortness of breath   . Recurrent upper respiratory infection (URI)   . Anxiety   . Pneumonia   . 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 Myocardial infarct     Past Surgical History  Procedure Laterality Date  . Mastectomy  1987    Right  . Breast reconstruction  1998    Reconstruction   . Reduction mammaplasty  1998    Left  . Mastoidectomy  childhood  . Appendectomy  1948  . Lumbar laminectomy  2003  . Shoulder surgery  02/2005    Bilateral fractures with multiple surgeries  . Breast implant removal  06/12/09    right  . Abdominal hysterectomy    . Fracture surgery      fracture right elbow  . Coronary angioplasty  11/12    distal RCA  . Mastectomy      Family History  Problem Relation Age of Onset  . Heart attack Father 30  . Cancer Mother     ovarian  . Cancer Brother     lung  . Arthritis      family    History   Social History  . Marital Status: Widowed    Spouse Name: N/A    Number of Children: 2  . Years of Education: N/A   Occupational History  . 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     Social History Main Topics  . Smoking status: Former Smoker    Quit date: 07/13/1974  . Smokeless tobacco: Never Used  . Alcohol Use: Yes     Comment: occasional  . Drug Use: No  . Sexually Active: No   Other Topics Concern  . Not on file   Social History Narrative  . No narrative on file   Review of Systems Sleeps okay in bed--will get up to void then trouble falling asleep Appetite is okay Bowels are okay--uses a probiotic    Objective:   Physical Exam  Constitutional: She appears well-developed and well-nourished. No distress.  Neck: Normal range of motion. No thyromegaly present.   Cardiovascular: Normal rate, regular rhythm and normal heart sounds.  Exam reveals no gallop.   No murmur heard. Pulmonary/Chest: Effort normal and breath sounds normal. No respiratory distress. She has no wheezes.  She has no rales.  Abdominal: Soft. There is no tenderness.  Musculoskeletal: She exhibits no edema and no tenderness.  Lymphadenopathy:    She has no cervical adenopathy.  Psychiatric: She has a normal mood and affect. Her behavior is normal.          Assessment & Plan:

## 2012-09-02 NOTE — Addendum Note (Signed)
Addended by: Sueanne Margarita on: 09/02/2012 10:17 AM   Modules accepted: Orders

## 2012-09-02 NOTE — Assessment & Plan Note (Signed)
Well compensated Monitors weight daily and has neutral fluid status No changes needed

## 2012-09-27 ENCOUNTER — Other Ambulatory Visit: Payer: Self-pay | Admitting: *Deleted

## 2012-09-27 MED ORDER — FUROSEMIDE 40 MG PO TABS: 40.0000 mg | ORAL_TABLET | Freq: Every day | ORAL | Status: DC

## 2012-10-05 ENCOUNTER — Emergency Department (HOSPITAL_COMMUNITY)
Admission: EM | Admit: 2012-10-05 | Discharge: 2012-10-05 | Disposition: A | Discharge status: Home / Self Care | Payer: Medicare Other | Attending: Emergency Medicine | Admitting: Emergency Medicine

## 2012-10-05 ENCOUNTER — Telehealth: Payer: Self-pay | Admitting: Internal Medicine

## 2012-10-05 ENCOUNTER — Encounter (HOSPITAL_COMMUNITY): Payer: Self-pay | Admitting: *Deleted

## 2012-10-05 DIAGNOSIS — Z8709 Personal history of other diseases of the respiratory system: Secondary | ICD-10-CM | POA: Insufficient documentation

## 2012-10-05 DIAGNOSIS — I252 Old myocardial infarction: Secondary | ICD-10-CM | POA: Insufficient documentation

## 2012-10-05 DIAGNOSIS — F411 Generalized anxiety disorder: Secondary | ICD-10-CM | POA: Insufficient documentation

## 2012-10-05 DIAGNOSIS — M2550 Pain in unspecified joint: Secondary | ICD-10-CM | POA: Insufficient documentation

## 2012-10-05 DIAGNOSIS — K219 Gastro-esophageal reflux disease without esophagitis: Secondary | ICD-10-CM | POA: Insufficient documentation

## 2012-10-05 DIAGNOSIS — Z8701 Personal history of pneumonia (recurrent): Secondary | ICD-10-CM | POA: Insufficient documentation

## 2012-10-05 DIAGNOSIS — R011 Cardiac murmur, unspecified: Secondary | ICD-10-CM | POA: Insufficient documentation

## 2012-10-05 DIAGNOSIS — F329 Major depressive disorder, single episode, unspecified: Secondary | ICD-10-CM | POA: Insufficient documentation

## 2012-10-05 DIAGNOSIS — N390 Urinary tract infection, site not specified: Secondary | ICD-10-CM | POA: Insufficient documentation

## 2012-10-05 DIAGNOSIS — Z853 Personal history of malignant neoplasm of breast: Secondary | ICD-10-CM | POA: Insufficient documentation

## 2012-10-05 DIAGNOSIS — Z87891 Personal history of nicotine dependence: Secondary | ICD-10-CM | POA: Insufficient documentation

## 2012-10-05 DIAGNOSIS — L293 Anogenital pruritus, unspecified: Secondary | ICD-10-CM | POA: Insufficient documentation

## 2012-10-05 DIAGNOSIS — M549 Dorsalgia, unspecified: Secondary | ICD-10-CM | POA: Insufficient documentation

## 2012-10-05 DIAGNOSIS — E785 Hyperlipidemia, unspecified: Secondary | ICD-10-CM | POA: Insufficient documentation

## 2012-10-05 DIAGNOSIS — M542 Cervicalgia: Secondary | ICD-10-CM | POA: Insufficient documentation

## 2012-10-05 DIAGNOSIS — Z79899 Other long term (current) drug therapy: Secondary | ICD-10-CM | POA: Insufficient documentation

## 2012-10-05 DIAGNOSIS — I10 Essential (primary) hypertension: Secondary | ICD-10-CM | POA: Insufficient documentation

## 2012-10-05 DIAGNOSIS — J4489 Other specified chronic obstructive pulmonary disease: Secondary | ICD-10-CM | POA: Insufficient documentation

## 2012-10-05 DIAGNOSIS — Z7982 Long term (current) use of aspirin: Secondary | ICD-10-CM | POA: Insufficient documentation

## 2012-10-05 DIAGNOSIS — J449 Chronic obstructive pulmonary disease, unspecified: Secondary | ICD-10-CM | POA: Insufficient documentation

## 2012-10-05 DIAGNOSIS — Z8669 Personal history of other diseases of the nervous system and sense organs: Secondary | ICD-10-CM | POA: Insufficient documentation

## 2012-10-05 DIAGNOSIS — Z8739 Personal history of other diseases of the musculoskeletal system and connective tissue: Secondary | ICD-10-CM | POA: Insufficient documentation

## 2012-10-05 DIAGNOSIS — Z8679 Personal history of other diseases of the circulatory system: Secondary | ICD-10-CM | POA: Insufficient documentation

## 2012-10-05 DIAGNOSIS — F3289 Other specified depressive episodes: Secondary | ICD-10-CM | POA: Insufficient documentation

## 2012-10-05 DIAGNOSIS — Z8673 Personal history of transient ischemic attack (TIA), and cerebral infarction without residual deficits: Secondary | ICD-10-CM | POA: Insufficient documentation

## 2012-10-05 LAB — CBC WITH DIFFERENTIAL/PLATELET
Eosinophils Absolute: 0.2 10*3/uL (ref 0.0–0.7)
Eosinophils Relative: 3 % (ref 0–5)
HCT: 35.2 % — ABNORMAL LOW (ref 36.0–46.0)
Lymphocytes Relative: 17 % (ref 12–46)
Lymphs Abs: 0.9 10*3/uL (ref 0.7–4.0)
MCH: 32.8 pg (ref 26.0–34.0)
MCV: 97 fL (ref 78.0–100.0)
Monocytes Absolute: 0.6 10*3/uL (ref 0.1–1.0)
RBC: 3.63 MIL/uL — ABNORMAL LOW (ref 3.87–5.11)
RDW: 12.2 % (ref 11.5–15.5)
WBC: 5.3 10*3/uL (ref 4.0–10.5)

## 2012-10-05 LAB — URINE MICROSCOPIC-ADD ON

## 2012-10-05 LAB — URINALYSIS, ROUTINE W REFLEX MICROSCOPIC
Glucose, UA: NEGATIVE mg/dL
Hgb urine dipstick: NEGATIVE
Ketones, ur: NEGATIVE mg/dL
Protein, ur: NEGATIVE mg/dL
pH: 6 (ref 5.0–8.0)

## 2012-10-05 LAB — COMPREHENSIVE METABOLIC PANEL
AST: 22 U/L (ref 0–37)
Albumin: 4 g/dL (ref 3.5–5.2)
Alkaline Phosphatase: 58 U/L (ref 39–117)
BUN: 35 mg/dL — ABNORMAL HIGH (ref 6–23)
Chloride: 103 mEq/L (ref 96–112)
Potassium: 4.9 mEq/L (ref 3.5–5.1)
Total Bilirubin: 0.4 mg/dL (ref 0.3–1.2)
Total Protein: 7.3 g/dL (ref 6.0–8.3)

## 2012-10-05 MED ORDER — MICONAZOLE NITRATE 2 % VA CREA: TOPICAL_CREAM | VAGINAL | Status: DC

## 2012-10-05 MED ORDER — FOSFOMYCIN TROMETHAMINE 3 G PO PACK
3.0000 g | PACK | ORAL | Status: AC
  Administered 2012-10-05: 3 g via ORAL
  Filled 2012-10-05: qty 3

## 2012-10-05 NOTE — ED Provider Notes (Signed)
History     CSN: [id number]  Arrival date & time 10/05/12  1309   First MD Initiated Contact with Patient 10/05/12 1355      Chief Complaint  Patient presents with  . Tremors    (Consider location/radiation/quality/duration/timing/severity/associated sxs/prior treatment) HPI Comments: Pt is an 79yo female with complicated PMH who presents with "tremors" (involuntary whole-body twitching) since last night. Pt states she has had leg cramps at night for the past two days, which is not unusual, but she began having generalized involuntary twitching last night, which as been worse last night and worse today throughout the day. Pt lives with her daughter but they generally "do their own separate things," and daughter gave her a banana and orange juice for breakfast thinking perhaps her potassium was low. Pt contacted her PCP who offered outpt evaluation, but pt and family declined, bringing her here instead. Since arrival, pt's tremors have been about the same (i.e., worse than last night, but not changed).   Of note, pt complained of dark, foul-smelling urine this morning. Per pt and family, pt has had UTI's in the past with "odd" symptoms (mental status changes, etc) in the absence of urinary symptoms. Pt endorses some vaginal itching, but no bleeding or discharge, no dysuria or increased frequency/urge.  Otherwise pt has few complaints. See ROS below; significant for no chest pain, SOB, fevers/chills, or other change in bowel habits. No mental status changes. Pt is on numerous medications for HTN, CHF, depression, back pain/arthritis, etc. Reports compliance with meds and no missed dosages on anything other than calcium supplement.  The history is provided by the patient and a relative (two daughters, one lives with pt). No language interpreter was used.    Past Medical History  Diagnosis Date  . Allergic rhinitis, cause unspecified   . Unspecified cardiovascular disease   . Personal history  of malignant neoplasm of breast   . Occlusion and stenosis of carotid artery without mention of cerebral infarction   . Chronic airway obstruction, not elsewhere classified   . Depressive disorder, not elsewhere classified   . Other dyspnea and respiratory abnormality   . Esophageal reflux   . Unspecified hearing loss   . Other and unspecified hyperlipidemia   . Unspecified essential hypertension   . Osteoporosis, unspecified   . Peripheral vascular disease, unspecified   . Unspecified urinary incontinence   . Spinal stenosis   . ACE-inhibitor cough   . Angina   . Heart murmur     aS CHILD  . Shortness of breath   . Recurrent upper respiratory infection (URI)   . Anxiety   . Pneumonia   . 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 Myocardial infarct   . Osteoarthrosis, unspecified whether generalized or localized, unspecified site   . Malignant neoplasm of breast (female), unspecified site     Past Surgical History  Procedure Laterality Date  . Mastectomy  1987    Right  . Breast reconstruction  1998    Reconstruction   . Reduction mammaplasty  1998    Left  . Mastoidectomy  childhood  . Appendectomy  1948  . Lumbar laminectomy  2003  . Shoulder surgery  02/2005    Bilateral fractures with multiple surgeries  . Breast implant removal  06/12/09    right  . Abdominal hysterectomy    . Fracture surgery      fracture right elbow  . Coronary angioplasty  11/12    distal  RCA  . Mastectomy      Family History  Problem Relation Age of Onset  . Heart attack Father 32  . Cancer Mother     ovarian  . Cancer Brother     lung  . Arthritis      family    History  Substance Use Topics  . Smoking status: Former Smoker    Quit date: 07/13/1974  . Smokeless tobacco: Never Used  . Alcohol Use: Yes     Comment: occasional    OB History   Grav Para Term Preterm Abortions TAB SAB Ect Mult Living                  Review of Systems  Constitutional:  Negative for fever, chills, activity change and appetite change.  HENT: Positive for neck pain (baseline arthritis). Negative for nosebleeds, congestion, mouth sores and trouble swallowing.   Eyes: Negative for photophobia and visual disturbance.  Respiratory: Negative for cough, chest tightness, shortness of breath and wheezing.   Cardiovascular: Negative for chest pain and leg swelling.  Gastrointestinal: Negative for nausea, vomiting, abdominal pain, diarrhea and constipation.  Endocrine: Negative for polydipsia and polyuria.  Genitourinary: Negative for dysuria, frequency, hematuria, flank pain, vaginal bleeding and difficulty urinating.  Musculoskeletal: Positive for back pain (baseline arthritis) and arthralgias (especially bilateral knees). Negative for joint swelling.  Skin: Negative for rash.  Neurological: Positive for tremors. Negative for seizures, syncope, speech difficulty, weakness, numbness and headaches.  Hematological: Does not bruise/bleed easily.  Psychiatric/Behavioral: Negative for hallucinations, sleep disturbance, dysphoric mood and agitation. The patient is not nervous/anxious.     Allergies  Amoxicillin-pot clavulanate; Amoxicillin; Cephalexin; Clarithromycin; Nifedipine; Nsaids; Olmesartan medoxomil; and Tramadol hcl  Home Medications   Current Outpatient Rx  Name  Route  Sig  Dispense  Refill  . albuterol (PROVENTIL) (2.5 MG/3ML) 0.083% nebulizer solution   Nebulization   Take 2.5 mg by nebulization every 6 (six) hours as needed. For shortness of breath         . aspirin 81 MG tablet   Oral   Take 81 mg by mouth daily after lunch.          . Calcium Carbonate-Vitamin D (CALTRATE 600+D PO)   Oral   Take 1 tablet by mouth daily after lunch.          . carvedilol (COREG) 12.5 MG tablet   Oral   Take 1 tablet (12.5 mg total) by mouth 2 (two) times daily with a meal.   60 tablet   11   . cetirizine (ZYRTEC) 10 MG tablet   Oral   Take 10 mg by  mouth daily as needed. For allergies         . famotidine (PEPCID) 20 MG tablet   Oral   Take 20 mg by mouth 2 (two) times daily.          Marland Kitchen FLUoxetine (PROZAC) 20 MG capsule   Oral   Take 1 capsule (20 mg total) by mouth daily.   30 capsule   11   . fluticasone (FLONASE) 50 MCG/ACT nasal spray   Nasal   Place 2 sprays into the nose daily.   16 g   3   . furosemide (LASIX) 40 MG tablet   Oral   Take 1 tablet (40 mg total) by mouth daily.   30 tablet   6   . gabapentin (NEURONTIN) 100 MG capsule   Oral   Take 100 mg by mouth  2 (two) times daily.         . hydrOXYzine (VISTARIL) 25 MG capsule   Oral   Take 25 mg by mouth at bedtime as needed (for sleep).         . losartan (COZAAR) 50 MG tablet   Oral   Take 1 tablet (50 mg total) by mouth daily.   30 tablet   6   . montelukast (SINGULAIR) 10 MG tablet   Oral   Take 1 tablet (10 mg total) by mouth daily as needed. For severe resistant allergy symptoms   30 tablet   5   . Multiple Vitamin (MULITIVITAMIN WITH MINERALS) TABS   Oral   Take 1 tablet by mouth daily after lunch.          . oxyCODONE-acetaminophen (PERCOCET) 5-325 MG per tablet   Oral   Take 2 tablets by mouth every 4 (four) hours as needed for pain.          . pravastatin (PRAVACHOL) 80 MG tablet   Oral   Take 1 tablet (80 mg total) by mouth at bedtime.   90 tablet   3   . spironolactone (ALDACTONE) 25 MG tablet   Oral   Take 25 mg by mouth daily.         . vitamin B-12 (CYANOCOBALAMIN) 500 MCG tablet   Oral   Take 1,000 mcg by mouth 3 (three) times a week. Mon, Wed, Fri         . miconazole ([employer] MICONAZOLE 7) 2 % vaginal cream      Place one applicatorful vaginally at bedtime, for seven days.   45 g   0     BP 112/77  Pulse 81  Temp(Src) 98.2 F (36.8 C) (Oral)  Resp 15  SpO2 100%  Physical Exam  Nursing note and vitals reviewed. Constitutional: She is oriented to person, place, and time. She appears  well-developed and well-nourished.  Non-toxic appearance. No distress.  Pt has generalized involuntary twitching of all four extremities.  HENT:  Head: Normocephalic and atraumatic.  Right Ear: External ear normal. No drainage.  Left Ear: External ear normal. No drainage.  Nose: No rhinorrhea or sinus tenderness.  Mouth/Throat: Oropharynx is clear and moist and mucous membranes are normal.  Eyes: Conjunctivae are normal. Pupils are equal, round, and reactive to light. No scleral icterus.  Neck: Normal range of motion.  Cardiovascular: Normal rate, regular rhythm and normal heart sounds.   Heart sounds slightly difficult to auscultate secondary to involuntary pt movement  Pulmonary/Chest: Effort normal and breath sounds normal. No respiratory distress. She has no wheezes.  Abdominal: Soft. Bowel sounds are normal. She exhibits no distension. There is no tenderness.  Musculoskeletal: She exhibits no edema and no tenderness.  Neurological: She is alert and oriented to person, place, and time. She has normal strength. She displays normal reflexes. No cranial nerve deficit. GCS eye subscore is 4. GCS verbal subscore is 5. GCS motor subscore is 6.  Involuntary tremor to all four extremities, equal bilaterally  Skin: Skin is warm and dry. No rash noted. She is not diaphoretic.  Psychiatric: She has a normal mood and affect. Her behavior is normal.    ED Course  Procedures (including critical care time)  Labs Reviewed  COMPREHENSIVE METABOLIC PANEL - Abnormal; Notable for the following:    Glucose, Bld 169 (*)    BUN 35 (*)    Creatinine, Ser 1.37 (*)    GFR calc non  Af Amer 35 (*)    GFR calc Af Amer 41 (*)    All other components within normal limits  CBC WITH DIFFERENTIAL - Abnormal; Notable for the following:    RBC 3.63 (*)    Hemoglobin 11.9 (*)    HCT 35.2 (*)    All other components within normal limits  URINALYSIS, ROUTINE W REFLEX MICROSCOPIC - Abnormal; Notable for the  following:    Bilirubin Urine SMALL (*)    Leukocytes, UA LARGE (*)    All other components within normal limits  URINE MICROSCOPIC-ADD ON - Abnormal; Notable for the following:    Casts HYALINE CASTS (*)    All other components within normal limits  URINE CULTURE   No results found.   1. UTI (urinary tract infection)       MDM  Impression of UTI with atypical presentation; per family and pt, she has had UTI's in the past without urinary symptoms but with things like mental status change, etc. DDx also includes metabolic/electrolyte abnormalities (chemistries here generally unremarkable), stroke (very unlikely with exam as documented above; no focal deficits or weakness, normal mental status, etc).   Pt dosed x1 with fosfomycin prior to discharge. Pt is to f/u with PCP on Friday (appt already set up). Reviewed red flags/symptoms that would require prompt return to care at PCP's office or a return to the ED.  The above was discussed in its entirety with Dr. Jeraldine Loots throughout the pt's ED course.  Bobbye Morton, MD PGY-1, [facility]  Bobbye Morton, MD 10/05/12 [phone]

## 2012-10-05 NOTE — ED Notes (Signed)
Family at bedside. 

## 2012-10-05 NOTE — ED Notes (Signed)
Pt with leg cramps the last 2 nights that were relieved by banana and orange juice.  Last night she began having generalized involuntary twitching.  She has experienced involuntary twitching r/t wearing a lidocaine patch too long (pt has not used since then).  Pt also c/o dark/foul-smelling urine.

## 2012-10-05 NOTE — Telephone Encounter (Signed)
Please check on her tomorrow 

## 2012-10-05 NOTE — ED Notes (Signed)
NAD noted at time of d/c home with daughter.  

## 2012-10-05 NOTE — ED Provider Notes (Signed)
I saw and evaluated the patient, reviewed the resident's note and I agree with the findings and plan. The patient had mild bilateral whole-body tremor without unilateral weakness, or other overt signs of CVA.  Given her positive urinalysis we all discussed in attempts at antibiotic control.  Dr. Casper Harrison arrange expedited outpatient followup, and return precautions were provided prior to discharge.  Gerhard Munch, MD 10/05/12 [phone]

## 2012-10-05 NOTE — Telephone Encounter (Signed)
Patient Information:  [name] Name: Okey Regal  Phone: [phone]  Patient: Haley Harris  Gender: Female  DOB: [birth year]  Age: 79 Years  PCP: Tillman Abide [facility])  Office Follow Up:  Does the office need to follow up with this patient?: No  Instructions For The Office: N/A  RN Note:  Declines OV, says taking her to [facility] ER now.  Symptoms  Reason For Call & Symptoms: Started cramping in both legs about 6 pm Mon 3/24.  Usually has tremors but got much worse about 6 pm last night 3/25.  Tremors and cramps continuing.  Generalized weakness.  Urine with bad odor this am 3/26.  Was able to eat breakfast of banana and coffee this am, held cup okay. Says calling to let us know family is taking her to [facility] ER now.  Has left message for her cardiologist although no chest pain.  Reviewed Health History In EMR: Yes  Reviewed Medications In EMR: Yes  Reviewed Allergies In EMR: Yes  Reviewed Surgeries / Procedures: Yes  Date of Onset of Symptoms: 10/03/2012  Guideline(s) Used:  Neurologic Deficit  Disposition Per Guideline:   See Today in Office  Reason For Disposition Reached:   Patient wants to be seen  Advice Given:  N/A  Patient Refused Recommendation:  Patient Will Go To ED  Declines OV for evaluation, is taking her to [facility] ER now

## 2012-10-06 LAB — URINE CULTURE

## 2012-10-06 NOTE — Telephone Encounter (Signed)
Spoke with patient and she's doing better, the tremors are slowing down. Pt has 8:30am appt 10/07/2012

## 2012-10-07 ENCOUNTER — Ambulatory Visit (INDEPENDENT_AMBULATORY_CARE_PROVIDER_SITE_OTHER): Payer: Medicare Other | Admitting: Internal Medicine

## 2012-10-07 ENCOUNTER — Encounter: Payer: Self-pay | Admitting: Internal Medicine

## 2012-10-07 VITALS — BP 100/68 | HR 81 | Temp 97.4°F | Wt 170.0 lb

## 2012-10-07 DIAGNOSIS — G252 Other specified forms of tremor: Secondary | ICD-10-CM | POA: Insufficient documentation

## 2012-10-07 DIAGNOSIS — R259 Unspecified abnormal involuntary movements: Secondary | ICD-10-CM

## 2012-10-07 LAB — TSH: TSH: 1.51 u[IU]/mL (ref 0.35–5.50)

## 2012-10-07 LAB — T4, FREE: Free T4: 1.18 ng/dL (ref 0.60–1.60)

## 2012-10-07 NOTE — Assessment & Plan Note (Signed)
No other Parkinson's features No new meds to explain Will check thyroid  No Rx as mostly better

## 2012-10-07 NOTE — Progress Notes (Signed)
  Subjective:    Patient ID: Haley Harris, female    DOB: 1928-11-22, 79 y.o.   MRN: [id number]  HPI Here with daughter  Had head to toe twitching --they describe more of a jerking Couldn't stop so went to ER Diagnosed with possible UTI but culture now negative  Still has feeling of unrest inside-- like it could start again Did get better yesterday afternoon  No caffeine Has slept okay No stress Review of Systems Weight up slightly Drinking more fluid but doesn't seem to be urinating as much as she has --not the urgency after diuretic    Objective:   Physical Exam  Constitutional: She appears well-developed and well-nourished. No distress.  Neurological: She exhibits normal muscle tone.  Mild resting tremor--no increase with intention Handwriting is sloppier No bradykinesia---finger to nose is normal No focal weakness Gait is slow with cane--not clearly Parkinsonian  Psychiatric: She has a normal mood and affect. Her behavior is normal.          Assessment & Plan:

## 2012-10-10 ENCOUNTER — Encounter: Payer: Self-pay | Admitting: *Deleted

## 2012-12-22 ENCOUNTER — Encounter: Payer: Self-pay | Admitting: Internal Medicine

## 2012-12-22 ENCOUNTER — Ambulatory Visit (INDEPENDENT_AMBULATORY_CARE_PROVIDER_SITE_OTHER): Payer: Medicare Other | Admitting: Internal Medicine

## 2012-12-22 VITALS — BP 128/70 | HR 68 | Temp 98.4°F | Wt 166.0 lb

## 2012-12-22 DIAGNOSIS — R1032 Left lower quadrant pain: Secondary | ICD-10-CM

## 2012-12-22 NOTE — Assessment & Plan Note (Signed)
But tender epigastrium and "griping" pain throughout---especially after eating Seems to be obstipation Doesn't sound like gallbladder No fever or localized tenderness to suggest diverticulitis  Discussed bowel regimen Further testing if this isn't effective

## 2012-12-22 NOTE — Progress Notes (Signed)
Subjective:    Patient ID: Haley Harris, female    DOB: 07/22/1928, 79 y.o.   MRN: [id number]  HPI Having terrible pain in left side of abdomen "griping" all over-esp lower quadrants Just mucous and no stool in past few days Concerned she has diverticulitis  Bad diarrhea a few weeks ago---hasn't been normal since then Has metamucil but hasn't been using it lately  No fever Appetite is off No blood in mucus or stools Gets terrible griping pain and runs to bathroom after meals--- hasn't been going  Current Outpatient Prescriptions on File Prior to Visit  Medication Sig Dispense Refill  . albuterol (PROVENTIL) (2.5 MG/3ML) 0.083% nebulizer solution Take 2.5 mg by nebulization every 6 (six) hours as needed. For shortness of breath      . aspirin 81 MG tablet Take 81 mg by mouth daily after lunch.       . Calcium Carbonate-Vitamin D (CALTRATE 600+D PO) Take 1 tablet by mouth daily after lunch.       . carvedilol (COREG) 12.5 MG tablet Take 1 tablet (12.5 mg total) by mouth 2 (two) times daily with a meal.  60 tablet  11  . cetirizine (ZYRTEC) 10 MG tablet Take 10 mg by mouth daily as needed. For allergies      . famotidine (PEPCID) 20 MG tablet Take 20 mg by mouth 2 (two) times daily.       Marland Kitchen FLUoxetine (PROZAC) 20 MG capsule Take 1 capsule (20 mg total) by mouth daily.  30 capsule  11  . fluticasone (FLONASE) 50 MCG/ACT nasal spray Place 2 sprays into the nose daily.  16 g  3  . furosemide (LASIX) 40 MG tablet Take 1 tablet (40 mg total) by mouth daily.  30 tablet  6  . gabapentin (NEURONTIN) 100 MG capsule Take 100 mg by mouth 2 (two) times daily.      . hydrOXYzine (VISTARIL) 25 MG capsule Take 25 mg by mouth at bedtime as needed (for sleep).      . losartan (COZAAR) 50 MG tablet Take 1 tablet (50 mg total) by mouth daily.  30 tablet  6  . miconazole (CVS MICONAZOLE 7) 2 % vaginal cream Place one applicatorful vaginally at bedtime, for seven days.  45 g  0  . montelukast (SINGULAIR) 10  MG tablet Take 1 tablet (10 mg total) by mouth daily as needed. For severe resistant allergy symptoms  30 tablet  5  . Multiple Vitamin (MULITIVITAMIN WITH MINERALS) TABS Take 1 tablet by mouth daily after lunch.       . oxyCODONE-acetaminophen (PERCOCET) 5-325 MG per tablet Take 2 tablets by mouth every 4 (four) hours as needed for pain.       . pravastatin (PRAVACHOL) 80 MG tablet Take 1 tablet (80 mg total) by mouth at bedtime.  90 tablet  3  . spironolactone (ALDACTONE) 25 MG tablet Take 25 mg by mouth daily.      . vitamin B-12 (CYANOCOBALAMIN) 500 MCG tablet Take 1,000 mcg by mouth 3 (three) times a week. Mon, Wed, Fri       No current facility-administered medications on file prior to visit.    Allergies  Allergen Reactions  . Amoxicillin-Pot Clavulanate Anaphylaxis  . Amoxicillin     REACTION: Angioedema requiring intubation  . Cephalexin Other (See Comments)    Reaction unknown  . Clarithromycin Other (See Comments)    Reaction unknown  . Nifedipine Other (See Comments)    Reaction unknown  .  Nsaids Other (See Comments)    Heart issue  . Olmesartan Medoxomil     REACTION: cough  . Tramadol Hcl Other (See Comments)    Reaction unknown    Past Medical History  Diagnosis Date  . Allergic rhinitis, cause unspecified   . Unspecified cardiovascular disease   . Personal history of malignant neoplasm of breast   . Occlusion and stenosis of carotid artery without mention of cerebral infarction   . Chronic airway obstruction, not elsewhere classified   . Depressive disorder, not elsewhere classified   . Other dyspnea and respiratory abnormality   . Esophageal reflux   . Unspecified hearing loss   . Other and unspecified hyperlipidemia   . Unspecified essential hypertension   . Osteoporosis, unspecified   . Peripheral vascular disease, unspecified   . Unspecified urinary incontinence   . Spinal stenosis   . ACE-inhibitor cough   . Angina   . Heart murmur     aS CHILD   . Shortness of breath   . Recurrent upper respiratory infection (URI)   . Anxiety   . Pneumonia   . 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 Myocardial infarct   . Osteoarthrosis, unspecified whether generalized or localized, unspecified site   . Malignant neoplasm of breast (female), unspecified site     Past Surgical History  Procedure Laterality Date  . Mastectomy  1987    Right  . Breast reconstruction  1998    Reconstruction   . Reduction mammaplasty  1998    Left  . Mastoidectomy  childhood  . Appendectomy  1948  . Lumbar laminectomy  2003  . Shoulder surgery  02/2005    Bilateral fractures with multiple surgeries  . Breast implant removal  06/12/09    right  . Abdominal hysterectomy    . Fracture surgery      fracture right elbow  . Coronary angioplasty  11/12    distal RCA  . Mastectomy      Family History  Problem Relation Age of Onset  . Heart attack Father 57  . Cancer Mother     ovarian  . Cancer Brother     lung  . Arthritis      family    History   Social History  . Marital Status: Widowed    Spouse Name: N/A    Number of Children: 2  . Years of Education: N/A   Occupational History  . 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     Social History Main Topics  . Smoking status: Former Smoker    Quit date: 07/13/1974  . Smokeless tobacco: Never Used  . Alcohol Use: Yes     Comment: occasional  . Drug Use: No  . Sexually Active: No   Other Topics Concern  . Not on file   Social History Narrative  . No narrative on file   Review of Systems No dysuria or urinary urgency Nausea with out vomiting Lots of cough---attributes to allergies. Using drops in vaporizer which has helped No SOB Toe infection 1 month ago--needed 2 different antibiotics. Relates diarrhea to that    Objective:   Physical Exam  Constitutional: She appears well-developed and well-nourished. No distress.  Neck: Normal range of motion. Neck  supple.  Pulmonary/Chest: Effort normal and breath sounds normal. No respiratory distress. She has no wheezes. She has no rales.  Abdominal: Soft. Bowel sounds are normal. She exhibits distension. She  exhibits no mass. There is tenderness. There is no rebound and no guarding.  Slight distention Mild epigastric tenderness but not really in LLQ  Lymphadenopathy:    She has no cervical adenopathy.  Psychiatric: She has a normal mood and affect. Her behavior is normal.          Assessment & Plan:

## 2012-12-22 NOTE — Patient Instructions (Signed)
Please try miralax (polyethylene glycol), a capful with full glass of water every 3-4 hours. Take through tomorrow (4-5 doses)  If you don't clear out by Saturday, try milk of magnesia or dulcolax (pill or suppository)

## 2013-01-23 ENCOUNTER — Other Ambulatory Visit: Payer: Self-pay | Admitting: *Deleted

## 2013-01-23 MED ORDER — SPIRONOLACTONE 25 MG PO TABS: 25.0000 mg | ORAL_TABLET | Freq: Every day | ORAL | Status: DC

## 2013-01-23 NOTE — Telephone Encounter (Signed)
I don't see the warning but she has been on this before I will check potassium and renal function at her next appt

## 2013-01-23 NOTE — Telephone Encounter (Signed)
Received faxed refill request from pharmacy. Last office visit 12/22/12. See warning. Is it okay to refill medication?

## 2013-03-02 ENCOUNTER — Encounter: Payer: Self-pay | Admitting: Internal Medicine

## 2013-03-02 ENCOUNTER — Ambulatory Visit (INDEPENDENT_AMBULATORY_CARE_PROVIDER_SITE_OTHER): Payer: Medicare Other | Admitting: Internal Medicine

## 2013-03-02 VITALS — BP 130/80 | HR 77 | Wt 166.0 lb

## 2013-03-02 DIAGNOSIS — J4489 Other specified chronic obstructive pulmonary disease: Secondary | ICD-10-CM

## 2013-03-02 DIAGNOSIS — I5022 Chronic systolic (congestive) heart failure: Secondary | ICD-10-CM

## 2013-03-02 DIAGNOSIS — F3289 Other specified depressive episodes: Secondary | ICD-10-CM

## 2013-03-02 DIAGNOSIS — I10 Essential (primary) hypertension: Secondary | ICD-10-CM

## 2013-03-02 DIAGNOSIS — J449 Chronic obstructive pulmonary disease, unspecified: Secondary | ICD-10-CM

## 2013-03-02 DIAGNOSIS — I2510 Atherosclerotic heart disease of native coronary artery without angina pectoris: Secondary | ICD-10-CM

## 2013-03-02 DIAGNOSIS — Z23 Encounter for immunization: Secondary | ICD-10-CM

## 2013-03-02 DIAGNOSIS — E785 Hyperlipidemia, unspecified: Secondary | ICD-10-CM

## 2013-03-02 DIAGNOSIS — M810 Age-related osteoporosis without current pathological fracture: Secondary | ICD-10-CM

## 2013-03-02 DIAGNOSIS — F329 Major depressive disorder, single episode, unspecified: Secondary | ICD-10-CM

## 2013-03-02 DIAGNOSIS — Z0000 Encounter for general adult medical examination without abnormal findings: Secondary | ICD-10-CM | POA: Insufficient documentation

## 2013-03-02 MED ORDER — DENOSUMAB 60 MG/ML ~~LOC~~ SOLN
60.0000 mg | Freq: Once | SUBCUTANEOUS | Status: AC
  Administered 2013-03-02: 60 mg via SUBCUTANEOUS

## 2013-03-02 NOTE — Assessment & Plan Note (Signed)
BP Readings from Last 3 Encounters:  03/02/13 130/80  12/22/12 128/70  10/07/12 100/68   Good control on CHF meds

## 2013-03-02 NOTE — Assessment & Plan Note (Signed)
Mood has been fine Will continue the fluoxetine

## 2013-03-02 NOTE — Addendum Note (Signed)
Addended by: Sueanne Margarita on: 03/02/2013 04:27 PM   Modules accepted: Orders

## 2013-03-02 NOTE — Assessment & Plan Note (Signed)
Will update Td and pneumovax (not sure she had it) No cancer screening Discussed healthy behaviors

## 2013-03-02 NOTE — Assessment & Plan Note (Signed)
Multiple fractures Due for prolia

## 2013-03-02 NOTE — Assessment & Plan Note (Signed)
Has some cough mostly from allergies

## 2013-03-02 NOTE — Progress Notes (Signed)
Subjective:    Patient ID: Haley Harris, female    DOB: 04/21/1929, 79 y.o.   MRN: [id number]  HPI Here for physical Daughter is here Andrey Campanile) Reviewed advanced directives Did have 1 fall--only broke a rib (feels the prolia is helping)  Keeps up with Dr Sharyn Lull  No chest pain Breathing has been stable Some cough---relates to allergies Still with rhinorrhea---meds help this  Mood okay usually Some sleep problems --seems some better lately with therpeutic oil  Current Outpatient Prescriptions on File Prior to Visit  Medication Sig Dispense Refill  . albuterol (PROVENTIL) (2.5 MG/3ML) 0.083% nebulizer solution Take 2.5 mg by nebulization every 6 (six) hours as needed. For shortness of breath      . aspirin 81 MG tablet Take 81 mg by mouth daily after lunch.       . Calcium Carbonate-Vitamin D (CALTRATE 600+D PO) Take 1 tablet by mouth daily after lunch.       . carvedilol (COREG) 12.5 MG tablet Take 1 tablet (12.5 mg total) by mouth 2 (two) times daily with a meal.  60 tablet  11  . cetirizine (ZYRTEC) 10 MG tablet Take 10 mg by mouth daily as needed. For allergies      . famotidine (PEPCID) 20 MG tablet Take 20 mg by mouth 2 (two) times daily.       Marland Kitchen FLUoxetine (PROZAC) 20 MG capsule Take 1 capsule (20 mg total) by mouth daily.  30 capsule  11  . fluticasone (FLONASE) 50 MCG/ACT nasal spray Place 2 sprays into the nose daily.  16 g  3  . furosemide (LASIX) 40 MG tablet Take 1 tablet (40 mg total) by mouth daily.  30 tablet  6  . gabapentin (NEURONTIN) 100 MG capsule Take 100 mg by mouth 2 (two) times daily.      . hydrOXYzine (VISTARIL) 25 MG capsule Take 25 mg by mouth at bedtime as needed (for sleep).      . losartan (COZAAR) 50 MG tablet Take 1 tablet (50 mg total) by mouth daily.  30 tablet  6  . miconazole ([employer] MICONAZOLE 7) 2 % vaginal cream Place one applicatorful vaginally at bedtime, for seven days.  45 g  0  . montelukast (SINGULAIR) 10 MG tablet Take 1 tablet (10 mg total)  by mouth daily as needed. For severe resistant allergy symptoms  30 tablet  5  . Multiple Vitamin (MULITIVITAMIN WITH MINERALS) TABS Take 1 tablet by mouth daily after lunch.       . oxyCODONE-acetaminophen (PERCOCET) 5-325 MG per tablet Take 2 tablets by mouth every 4 (four) hours as needed for pain.       . pravastatin (PRAVACHOL) 80 MG tablet Take 1 tablet (80 mg total) by mouth at bedtime.  90 tablet  3  . spironolactone (ALDACTONE) 25 MG tablet Take 1 tablet (25 mg total) by mouth daily.  30 tablet  5  . vitamin B-12 (CYANOCOBALAMIN) 500 MCG tablet Take 1,000 mcg by mouth 3 (three) times a week. Mon, Wed, Fri       No current facility-administered medications on file prior to visit.    Allergies  Allergen Reactions  . Amoxicillin-Pot Clavulanate Anaphylaxis  . Amoxicillin     REACTION: Angioedema requiring intubation  . Cephalexin Other (See Comments)    Reaction unknown  . Clarithromycin Other (See Comments)    Reaction unknown  . Nifedipine Other (See Comments)    Reaction unknown  . Nsaids Other (See Comments)  Heart issue  . Olmesartan Medoxomil     REACTION: cough  . Tramadol Hcl Other (See Comments)    Reaction unknown    Past Medical History  Diagnosis Date  . Allergic rhinitis, cause unspecified   . Unspecified cardiovascular disease   . Personal history of malignant neoplasm of breast   . Occlusion and stenosis of carotid artery without mention of cerebral infarction   . Chronic airway obstruction, not elsewhere classified   . Depressive disorder, not elsewhere classified   . Other dyspnea and respiratory abnormality   . Esophageal reflux   . Unspecified hearing loss   . Other and unspecified hyperlipidemia   . Unspecified essential hypertension   . Osteoporosis, unspecified   . Peripheral vascular disease, unspecified   . Unspecified urinary incontinence   . Spinal stenosis   . ACE-inhibitor cough   . Angina   . Heart murmur     aS CHILD  . Shortness  of breath   . Recurrent upper respiratory infection (URI)   . Anxiety   . Pneumonia   . 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 Myocardial infarct   . Osteoarthrosis, unspecified whether generalized or localized, unspecified site   . Malignant neoplasm of breast (female), unspecified site     Past Surgical History  Procedure Laterality Date  . Mastectomy  1987    Right  . Breast reconstruction  1998    Reconstruction   . Reduction mammaplasty  1998    Left  . Mastoidectomy  childhood  . Appendectomy  1948  . Lumbar laminectomy  2003  . Shoulder surgery  02/2005    Bilateral fractures with multiple surgeries  . Breast implant removal  06/12/09    right  . Abdominal hysterectomy    . Fracture surgery      fracture right elbow  . Coronary angioplasty  11/12    distal RCA  . Mastectomy      Family History  Problem Relation Age of Onset  . Heart attack Father 56  . Cancer Mother     ovarian  . Cancer Brother     lung  . Arthritis      family    History   Social History  . Marital Status: Widowed    Spouse Name: N/A    Number of Children: 2  . Years of Education: N/A   Occupational History  . 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     Social History Main Topics  . Smoking status: Former Smoker    Quit date: 07/13/1974  . Smokeless tobacco: Never Used  . Alcohol Use: Yes     Comment: occasional  . Drug Use: No  . Sexual Activity: No   Other Topics Concern  . Not on file   Social History Narrative   Has living will   Son or daughter would be health care POA.   Requests DNR--written 03/02/13   No tube feeds if cognitively unaware   Review of Systems  Constitutional: Negative for fatigue and unexpected weight change.       Wears seat belt Doesn't drive  HENT: Positive for hearing loss, congestion and rhinorrhea.   Eyes: Negative for visual disturbance.       Due for cataract extraction on right soon  Respiratory: Positive for  cough. Negative for chest tightness and shortness of breath.   Cardiovascular: Negative for chest pain and palpitations.  Gastrointestinal: Negative for  nausea, vomiting, abdominal pain, constipation and blood in stool.       No heartburn  Genitourinary: Negative for dysuria, hematuria and difficulty urinating.  Musculoskeletal: Positive for joint swelling and arthralgias.       Wearing a brace on left knee  Allergic/Immunologic: Positive for environmental allergies. Negative for immunocompromised state.  Neurological: Positive for dizziness. Negative for syncope and light-headedness.       Some dizzy feeling if she turns too fast Generally uses a cane (rollator for extended trips)  Psychiatric/Behavioral: Positive for sleep disturbance. Negative for dysphoric mood. The patient is not nervous/anxious.        Objective:   Physical Exam  Constitutional: She appears well-developed and well-nourished. No distress.  Neck: Normal range of motion. Neck supple. No thyromegaly present.  Cardiovascular: Normal rate, regular rhythm and normal heart sounds.  Exam reveals no gallop.   No murmur heard. Faint distal pulses  Pulmonary/Chest: Effort normal and breath sounds normal. No respiratory distress. She has no wheezes. She has no rales.  Abdominal: Soft. There is no tenderness.  Musculoskeletal: She exhibits edema.  Trace edema  Lymphadenopathy:    She has no cervical adenopathy.  Skin: No rash noted.  Psychiatric: She has a normal mood and affect. Her behavior is normal.          Assessment & Plan:

## 2013-03-02 NOTE — Assessment & Plan Note (Signed)
euvolemic On approp meds Due for labs

## 2013-03-02 NOTE — Assessment & Plan Note (Signed)
Has been quiet Continues with Dr Sharyn Lull

## 2013-03-03 LAB — CBC WITH DIFFERENTIAL/PLATELET
Basophils Relative: 0.2 % (ref 0.0–3.0)
Eosinophils Absolute: 0.2 10*3/uL (ref 0.0–0.7)
Eosinophils Relative: 3.7 % (ref 0.0–5.0)
HCT: 35.8 % — ABNORMAL LOW (ref 36.0–46.0)
Lymphs Abs: 1.1 10*3/uL (ref 0.7–4.0)
MCHC: 33.7 g/dL (ref 30.0–36.0)
MCV: 99.1 fl (ref 78.0–100.0)
Monocytes Absolute: 0.7 10*3/uL (ref 0.1–1.0)
Platelets: 169 10*3/uL (ref 150.0–400.0)
RBC: 3.62 Mil/uL — ABNORMAL LOW (ref 3.87–5.11)
WBC: 6.3 10*3/uL (ref 4.5–10.5)

## 2013-03-03 LAB — BASIC METABOLIC PANEL
BUN: 27 mg/dL — ABNORMAL HIGH (ref 6–23)
CO2: 28 mEq/L (ref 19–32)
Chloride: 99 mEq/L (ref 96–112)
Creatinine, Ser: 1.3 mg/dL — ABNORMAL HIGH (ref 0.4–1.2)
Potassium: 5 mEq/L (ref 3.5–5.1)

## 2013-03-03 LAB — LIPID PANEL
Cholesterol: 174 mg/dL (ref 0–200)
LDL Cholesterol: 83 mg/dL (ref 0–99)
Triglycerides: 107 mg/dL (ref 0.0–149.0)

## 2013-03-03 LAB — HEPATIC FUNCTION PANEL
ALT: 18 U/L (ref 0–35)
AST: 24 U/L (ref 0–37)
Albumin: 4.1 g/dL (ref 3.5–5.2)

## 2013-03-07 ENCOUNTER — Encounter: Payer: Self-pay | Admitting: *Deleted

## 2013-03-08 ENCOUNTER — Other Ambulatory Visit: Payer: Self-pay | Admitting: Internal Medicine

## 2013-03-08 ENCOUNTER — Encounter (INDEPENDENT_AMBULATORY_CARE_PROVIDER_SITE_OTHER): Payer: Medicare Other

## 2013-03-08 DIAGNOSIS — I6529 Occlusion and stenosis of unspecified carotid artery: Secondary | ICD-10-CM

## 2013-03-08 NOTE — Telephone Encounter (Signed)
Okay to fill all for a year 

## 2013-03-08 NOTE — Telephone Encounter (Signed)
OK to fill prozac?

## 2013-03-30 ENCOUNTER — Other Ambulatory Visit: Payer: Self-pay | Admitting: Internal Medicine

## 2013-04-25 ENCOUNTER — Other Ambulatory Visit: Payer: Self-pay | Admitting: Internal Medicine

## 2013-05-04 ENCOUNTER — Ambulatory Visit (INDEPENDENT_AMBULATORY_CARE_PROVIDER_SITE_OTHER): Payer: Medicare Other

## 2013-05-04 DIAGNOSIS — Z23 Encounter for immunization: Secondary | ICD-10-CM

## 2013-05-23 ENCOUNTER — Telehealth: Payer: Self-pay

## 2013-05-23 NOTE — Telephone Encounter (Signed)
Pt left v/m requesting date of last EKG;pt said if not done in last 6 months will need prior to cataract surgery. Pt notified last EKG was 07/21/2011. Pt will let hospital know when has preop.

## 2013-05-31 ENCOUNTER — Ambulatory Visit: Payer: Self-pay | Admitting: Ophthalmology

## 2013-05-31 DIAGNOSIS — I10 Essential (primary) hypertension: Secondary | ICD-10-CM

## 2013-05-31 LAB — POTASSIUM: Potassium: 5 mmol/L (ref 3.5–5.1)

## 2013-06-19 ENCOUNTER — Encounter: Payer: Self-pay | Admitting: Internal Medicine

## 2013-06-19 ENCOUNTER — Ambulatory Visit: Payer: Self-pay | Admitting: Podiatry

## 2013-06-19 ENCOUNTER — Ambulatory Visit: Payer: Self-pay | Admitting: Ophthalmology

## 2013-06-26 ENCOUNTER — Encounter: Payer: Self-pay | Admitting: Podiatry

## 2013-06-26 ENCOUNTER — Ambulatory Visit (INDEPENDENT_AMBULATORY_CARE_PROVIDER_SITE_OTHER): Payer: Medicare Other | Admitting: Podiatry

## 2013-06-26 VITALS — BP 105/62 | HR 68 | Resp 16 | Ht 66.0 in | Wt 162.0 lb

## 2013-06-26 DIAGNOSIS — B351 Tinea unguium: Secondary | ICD-10-CM

## 2013-06-26 DIAGNOSIS — M79609 Pain in unspecified limb: Secondary | ICD-10-CM

## 2013-06-26 NOTE — Progress Notes (Signed)
Chief complaint of painful toenails one through 5 bilateral.  Objective: Nails are thick yellow dystrophic onychomycotic and painful palpation.  Assessment: Pain in limb secondary to onychomycosis 1 through 5 bilateral.  Plan: Debridement of nails 1 through 5 bilateral is cover service secondary to pain followup with her in 3 months.

## 2013-07-01 ENCOUNTER — Other Ambulatory Visit: Payer: Self-pay | Admitting: Internal Medicine

## 2013-08-09 ENCOUNTER — Other Ambulatory Visit: Payer: Self-pay | Admitting: Internal Medicine

## 2013-08-26 ENCOUNTER — Other Ambulatory Visit: Payer: Self-pay | Admitting: Internal Medicine

## 2013-08-31 ENCOUNTER — Ambulatory Visit: Payer: Medicare Other | Admitting: Internal Medicine

## 2013-09-06 ENCOUNTER — Ambulatory Visit (INDEPENDENT_AMBULATORY_CARE_PROVIDER_SITE_OTHER): Payer: Medicare Other | Admitting: Internal Medicine

## 2013-09-06 ENCOUNTER — Encounter: Payer: Self-pay | Admitting: Internal Medicine

## 2013-09-06 VITALS — BP 110/70 | HR 70 | Temp 98.7°F | Wt 166.0 lb

## 2013-09-06 DIAGNOSIS — I2510 Atherosclerotic heart disease of native coronary artery without angina pectoris: Secondary | ICD-10-CM

## 2013-09-06 DIAGNOSIS — M810 Age-related osteoporosis without current pathological fracture: Secondary | ICD-10-CM

## 2013-09-06 DIAGNOSIS — I10 Essential (primary) hypertension: Secondary | ICD-10-CM

## 2013-09-06 DIAGNOSIS — J449 Chronic obstructive pulmonary disease, unspecified: Secondary | ICD-10-CM

## 2013-09-06 DIAGNOSIS — I5022 Chronic systolic (congestive) heart failure: Secondary | ICD-10-CM

## 2013-09-06 DIAGNOSIS — F3289 Other specified depressive episodes: Secondary | ICD-10-CM

## 2013-09-06 DIAGNOSIS — F329 Major depressive disorder, single episode, unspecified: Secondary | ICD-10-CM

## 2013-09-06 MED ORDER — DENOSUMAB 60 MG/ML ~~LOC~~ SOLN
60.0000 mg | Freq: Once | SUBCUTANEOUS | Status: AC
  Administered 2013-09-06: 60 mg via SUBCUTANEOUS

## 2013-09-06 NOTE — Assessment & Plan Note (Signed)
Compensated. No changes needed. 

## 2013-09-06 NOTE — Progress Notes (Signed)
Subjective:    Patient ID: Haley Harris, female    DOB: 1929/02/28, 80 y.o.   MRN: [id number]  HPI Here for follow up Doing fairly well Has aches and pains from the cold weather  Heart seems fine Dr Terrence Dupont is satisfied She weighs daily--has been stable No chest pain or palpitations Gets DOE and with cold exposure---this is stable No sig edema Occasional mild dizziness if she moves too fast--after standing  Mood has been good No depression right now Generally not happy this time of year---cold, limited, lots of family deaths in February  No regular cough---just occasional Allergy issues persist--still gets drip Still limits her exposure to cold  Current Outpatient Prescriptions on File Prior to Visit  Medication Sig Dispense Refill  . albuterol (PROVENTIL) (2.5 MG/3ML) 0.083% nebulizer solution Take 2.5 mg by nebulization every 6 (six) hours as needed. For shortness of breath      . aspirin 81 MG tablet Take 81 mg by mouth daily after lunch.       . Calcium Carbonate-Vitamin D (CALTRATE 600+D PO) Take 1 tablet by mouth daily after lunch.       . carvedilol (COREG) 12.5 MG tablet TAKE ONE (1) TABLET BY MOUTH TWO (2) TIMES DAILY WITH A MEAL  60 tablet  11  . cetirizine (ZYRTEC) 10 MG tablet Take 10 mg by mouth daily as needed. For allergies      . famotidine (PEPCID) 20 MG tablet Take 20 mg by mouth 2 (two) times daily.       Marland Kitchen FLUoxetine (PROZAC) 20 MG capsule TAKE ONE CAPSULE BY MOUTH DAILY  30 capsule  11  . fluticasone (FLONASE) 50 MCG/ACT nasal spray INHALE TWO (2) SPRAYS INTO THE NOSE DAILY AS DIRECTED  16 g  5  . furosemide (LASIX) 40 MG tablet TAKE ONE (1) TABLET BY MOUTH EVERY DAY  30 tablet  11  . gabapentin (NEURONTIN) 100 MG capsule Take 100 mg by mouth 2 (two) times daily.      Marland Kitchen losartan (COZAAR) 50 MG tablet TAKE ONE (1) TABLET BY MOUTH EVERY DAY  30 tablet  11  . montelukast (SINGULAIR) 10 MG tablet TAKE ONE TABLET BY MOUTH DAILY AS NEEDEDFOR SEVERE RESISTANT  ALLERGY SYMPTOMS  30 tablet  11  . Multiple Vitamin (MULITIVITAMIN WITH MINERALS) TABS Take 1 tablet by mouth daily after lunch.       . oxyCODONE-acetaminophen (PERCOCET) 5-325 MG per tablet Take 2 tablets by mouth every 4 (four) hours as needed for pain.       . pravastatin (PRAVACHOL) 80 MG tablet TAKE ONE TABLET BY MOUTH EVERY NIGHT AT BEDTIME  90 tablet  3  . spironolactone (ALDACTONE) 25 MG tablet TAKE 1 TABLET BY MOUTH DAILY  90 tablet  3  . vitamin B-12 (CYANOCOBALAMIN) 500 MCG tablet Take 1,000 mcg by mouth 3 (three) times a week. Mon, Wed, Fri       No current facility-administered medications on file prior to visit.    Allergies  Allergen Reactions  . Amoxicillin-Pot Clavulanate Anaphylaxis  . Amoxicillin     REACTION: Angioedema requiring intubation  . Cephalexin Other (See Comments)    Reaction unknown  . Clarithromycin Other (See Comments)    Reaction unknown  . Nifedipine Other (See Comments)    Reaction unknown  . Nsaids Other (See Comments)    Heart issue  . Olmesartan Medoxomil     REACTION: cough  . Tramadol Hcl Other (See Comments)  Reaction unknown    Past Medical History  Diagnosis Date  . Allergic rhinitis, cause unspecified   . Unspecified cardiovascular disease   . Personal history of malignant neoplasm of breast   . Occlusion and stenosis of carotid artery without mention of cerebral infarction   . Chronic airway obstruction, not elsewhere classified   . Depressive disorder, not elsewhere classified   . Other dyspnea and respiratory abnormality   . Esophageal reflux   . Unspecified hearing loss   . Other and unspecified hyperlipidemia   . Unspecified essential hypertension   . Osteoporosis, unspecified   . Peripheral vascular disease, unspecified   . Unspecified urinary incontinence   . Spinal stenosis   . ACE-inhibitor cough   . Angina   . Heart murmur     aS CHILD  . Shortness of breath   . Recurrent upper respiratory infection (URI)     . Anxiety   . Pneumonia   . 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 Myocardial infarct   . Osteoarthrosis, unspecified whether generalized or localized, unspecified site   . Malignant neoplasm of breast (female), unspecified site     Past Surgical History  Procedure Laterality Date  . Mastectomy  1987    Right  . Breast reconstruction  1998    Reconstruction   . Reduction mammaplasty  1998    Left  . Mastoidectomy  childhood  . Appendectomy  1948  . Lumbar laminectomy  2003  . Shoulder surgery  02/2005    Bilateral fractures with multiple surgeries  . Breast implant removal  06/12/09    right  . Abdominal hysterectomy    . Fracture surgery      fracture right elbow  . Coronary angioplasty  11/12    distal RCA  . Mastectomy      Family History  Problem Relation Age of Onset  . Heart attack Father 19  . Cancer Mother     ovarian  . Cancer Brother     lung  . Arthritis      family    History   Social History  . Marital Status: Widowed    Spouse Name: N/A    Number of Children: 2  . Years of Education: N/A   Occupational History  . 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     Social History Main Topics  . Smoking status: Former Smoker    Quit date: 07/13/1974  . Smokeless tobacco: Never Used  . Alcohol Use: Yes     Comment: occasional  . Drug Use: No  . Sexual Activity: No   Other Topics Concern  . Not on file   Social History Narrative   Has living will   Son or daughter would be health care POA.   Requests DNR--written 03/02/13   No tube feeds if cognitively unaware   Review of Systems Sleeps well Appetite isn't as good but eats okay Bowels are fine    Objective:   Physical Exam  Constitutional: She appears well-developed and well-nourished. No distress.  Neck: Normal range of motion. Neck supple.  Cardiovascular: Normal rate, regular rhythm and normal heart sounds.  Exam reveals no gallop.   No murmur  heard. Pulmonary/Chest: Effort normal and breath sounds normal. No respiratory distress. She has no wheezes. She has no rales.  Abdominal: Soft. There is no tenderness.  Musculoskeletal: She exhibits no edema and no tenderness.  Lymphadenopathy:  She has no cervical adenopathy.  Psychiatric: She has a normal mood and affect. Her behavior is normal.          Assessment & Plan:

## 2013-09-06 NOTE — Assessment & Plan Note (Signed)
Mood is controlled with the fluoxetine Will continue

## 2013-09-06 NOTE — Assessment & Plan Note (Signed)
Continuing prolia due to multiple fractures

## 2013-09-06 NOTE — Assessment & Plan Note (Signed)
No problems Doesn't do well in the cold weather but otherwise quiet

## 2013-09-06 NOTE — Assessment & Plan Note (Signed)
BP Readings from Last 3 Encounters:  09/06/13 110/70  06/26/13 105/62  03/02/13 130/80   Good control on CHF meds

## 2013-09-06 NOTE — Progress Notes (Signed)
Pre visit review using our clinic review tool, if applicable. No additional management support is needed unless otherwise documented below in the visit note. 

## 2013-09-06 NOTE — Assessment & Plan Note (Signed)
Prior MI Doing well on this

## 2013-09-06 NOTE — Addendum Note (Signed)
Addended by: Despina Hidden on: 09/06/2013 01:03 PM   Modules accepted: Orders

## 2013-09-08 ENCOUNTER — Telehealth: Payer: Self-pay | Admitting: Internal Medicine

## 2013-09-08 NOTE — Telephone Encounter (Signed)
Relevant patient education mailed to patient.  

## 2013-09-25 ENCOUNTER — Ambulatory Visit (INDEPENDENT_AMBULATORY_CARE_PROVIDER_SITE_OTHER): Payer: Medicare Other | Admitting: Podiatry

## 2013-09-25 ENCOUNTER — Encounter: Payer: Self-pay | Admitting: Podiatry

## 2013-09-25 VITALS — BP 110/70 | HR 70 | Resp 18

## 2013-09-25 DIAGNOSIS — B351 Tinea unguium: Secondary | ICD-10-CM

## 2013-09-25 DIAGNOSIS — M79609 Pain in unspecified limb: Secondary | ICD-10-CM

## 2013-09-25 NOTE — Progress Notes (Signed)
She presents today with a chief complaint of painful elongated toenails.  Objective: Vital signs are stable she is alert and oriented x3 pulses are intact bilateral. Nails are thick yellow dystrophic onychomycotic sharp incurvated painful palpation as well as debridement.  Assessment: Pain in limb secondary to onychomycosis.  Plan: Debridement of nails 1 through 5 bilateral covered service secondary to pain.

## 2013-12-27 ENCOUNTER — Encounter: Payer: Self-pay | Admitting: Podiatry

## 2013-12-27 ENCOUNTER — Ambulatory Visit (INDEPENDENT_AMBULATORY_CARE_PROVIDER_SITE_OTHER): Payer: Medicare Other | Admitting: Podiatry

## 2013-12-27 DIAGNOSIS — M79609 Pain in unspecified limb: Secondary | ICD-10-CM

## 2013-12-27 DIAGNOSIS — B351 Tinea unguium: Secondary | ICD-10-CM

## 2013-12-27 NOTE — Progress Notes (Signed)
She presents today complaining of left knee pain. She's also complaining of painful elongated toenails one through 5 bilateral.  Objective: Nails are thick yellow dystrophic onychomycotic bilateral. Pulses are palpable bilateral.  Assessment: Pain in limb secondary to onychomycosis 1 through 5 bilateral.  Plan: Debridement of nails 1 through 5 bilateral.

## 2014-02-07 ENCOUNTER — Other Ambulatory Visit (HOSPITAL_COMMUNITY): Payer: Self-pay | Admitting: Cardiology

## 2014-02-07 DIAGNOSIS — R079 Chest pain, unspecified: Secondary | ICD-10-CM

## 2014-02-14 ENCOUNTER — Encounter (HOSPITAL_COMMUNITY): Payer: Medicare Other

## 2014-02-14 ENCOUNTER — Ambulatory Visit (HOSPITAL_COMMUNITY): Payer: Medicare Other

## 2014-02-21 ENCOUNTER — Encounter (HOSPITAL_COMMUNITY): Payer: Medicare Other

## 2014-02-21 ENCOUNTER — Ambulatory Visit (HOSPITAL_COMMUNITY): Payer: Medicare Other

## 2014-02-23 ENCOUNTER — Encounter (HOSPITAL_COMMUNITY): Payer: Medicare Other

## 2014-02-23 ENCOUNTER — Ambulatory Visit (HOSPITAL_COMMUNITY): Payer: Medicare Other

## 2014-03-01 ENCOUNTER — Inpatient Hospital Stay (HOSPITAL_COMMUNITY)
Admission: EM | Admit: 2014-03-01 | Discharge: 2014-03-06 | DRG: 246 | Disposition: A | Discharge status: Skilled Nursing Facility | Payer: Medicare Other | Attending: Internal Medicine | Admitting: Internal Medicine

## 2014-03-01 ENCOUNTER — Encounter (HOSPITAL_COMMUNITY): Payer: Self-pay | Admitting: Emergency Medicine

## 2014-03-01 ENCOUNTER — Emergency Department (HOSPITAL_COMMUNITY): Payer: Medicare Other

## 2014-03-01 DIAGNOSIS — E875 Hyperkalemia: Secondary | ICD-10-CM | POA: Diagnosis present

## 2014-03-01 DIAGNOSIS — G9340 Encephalopathy, unspecified: Secondary | ICD-10-CM | POA: Diagnosis present

## 2014-03-01 DIAGNOSIS — J9601 Acute respiratory failure with hypoxia: Secondary | ICD-10-CM | POA: Diagnosis present

## 2014-03-01 DIAGNOSIS — Z886 Allergy status to analgesic agent status: Secondary | ICD-10-CM | POA: Diagnosis not present

## 2014-03-01 DIAGNOSIS — Z87891 Personal history of nicotine dependence: Secondary | ICD-10-CM | POA: Diagnosis not present

## 2014-03-01 DIAGNOSIS — J440 Chronic obstructive pulmonary disease with acute lower respiratory infection: Secondary | ICD-10-CM | POA: Diagnosis present

## 2014-03-01 DIAGNOSIS — I509 Heart failure, unspecified: Secondary | ICD-10-CM | POA: Diagnosis present

## 2014-03-01 DIAGNOSIS — J9600 Acute respiratory failure, unspecified whether with hypoxia or hypercapnia: Secondary | ICD-10-CM | POA: Diagnosis present

## 2014-03-01 DIAGNOSIS — F411 Generalized anxiety disorder: Secondary | ICD-10-CM | POA: Diagnosis present

## 2014-03-01 DIAGNOSIS — J810 Acute pulmonary edema: Secondary | ICD-10-CM | POA: Diagnosis present

## 2014-03-01 DIAGNOSIS — J449 Chronic obstructive pulmonary disease, unspecified: Secondary | ICD-10-CM | POA: Diagnosis present

## 2014-03-01 DIAGNOSIS — Z853 Personal history of malignant neoplasm of breast: Secondary | ICD-10-CM | POA: Diagnosis not present

## 2014-03-01 DIAGNOSIS — I252 Old myocardial infarction: Secondary | ICD-10-CM

## 2014-03-01 DIAGNOSIS — F329 Major depressive disorder, single episode, unspecified: Secondary | ICD-10-CM | POA: Diagnosis present

## 2014-03-01 DIAGNOSIS — M810 Age-related osteoporosis without current pathological fracture: Secondary | ICD-10-CM | POA: Diagnosis present

## 2014-03-01 DIAGNOSIS — Z888 Allergy status to other drugs, medicaments and biological substances status: Secondary | ICD-10-CM | POA: Diagnosis not present

## 2014-03-01 DIAGNOSIS — E7800 Pure hypercholesterolemia, unspecified: Secondary | ICD-10-CM | POA: Diagnosis present

## 2014-03-01 DIAGNOSIS — I679 Cerebrovascular disease, unspecified: Secondary | ICD-10-CM | POA: Diagnosis present

## 2014-03-01 DIAGNOSIS — M1990 Unspecified osteoarthritis, unspecified site: Secondary | ICD-10-CM | POA: Diagnosis present

## 2014-03-01 DIAGNOSIS — I209 Angina pectoris, unspecified: Secondary | ICD-10-CM

## 2014-03-01 DIAGNOSIS — Z7982 Long term (current) use of aspirin: Secondary | ICD-10-CM | POA: Diagnosis not present

## 2014-03-01 DIAGNOSIS — Z8249 Family history of ischemic heart disease and other diseases of the circulatory system: Secondary | ICD-10-CM

## 2014-03-01 DIAGNOSIS — F3289 Other specified depressive episodes: Secondary | ICD-10-CM | POA: Diagnosis present

## 2014-03-01 DIAGNOSIS — H9190 Unspecified hearing loss, unspecified ear: Secondary | ICD-10-CM | POA: Diagnosis present

## 2014-03-01 DIAGNOSIS — I2510 Atherosclerotic heart disease of native coronary artery without angina pectoris: Secondary | ICD-10-CM | POA: Diagnosis present

## 2014-03-01 DIAGNOSIS — I2589 Other forms of chronic ischemic heart disease: Secondary | ICD-10-CM | POA: Diagnosis present

## 2014-03-01 DIAGNOSIS — Z801 Family history of malignant neoplasm of trachea, bronchus and lung: Secondary | ICD-10-CM | POA: Diagnosis not present

## 2014-03-01 DIAGNOSIS — Z881 Allergy status to other antibiotic agents status: Secondary | ICD-10-CM

## 2014-03-01 DIAGNOSIS — R0602 Shortness of breath: Secondary | ICD-10-CM | POA: Diagnosis present

## 2014-03-01 DIAGNOSIS — I214 Non-ST elevation (NSTEMI) myocardial infarction: Secondary | ICD-10-CM | POA: Diagnosis present

## 2014-03-01 DIAGNOSIS — E872 Acidosis, unspecified: Secondary | ICD-10-CM | POA: Diagnosis present

## 2014-03-01 DIAGNOSIS — I447 Left bundle-branch block, unspecified: Secondary | ICD-10-CM | POA: Diagnosis present

## 2014-03-01 DIAGNOSIS — D649 Anemia, unspecified: Secondary | ICD-10-CM | POA: Diagnosis present

## 2014-03-01 DIAGNOSIS — I25119 Atherosclerotic heart disease of native coronary artery with unspecified angina pectoris: Secondary | ICD-10-CM | POA: Diagnosis present

## 2014-03-01 DIAGNOSIS — I6529 Occlusion and stenosis of unspecified carotid artery: Secondary | ICD-10-CM | POA: Diagnosis present

## 2014-03-01 DIAGNOSIS — I739 Peripheral vascular disease, unspecified: Secondary | ICD-10-CM | POA: Diagnosis present

## 2014-03-01 DIAGNOSIS — I10 Essential (primary) hypertension: Secondary | ICD-10-CM | POA: Diagnosis present

## 2014-03-01 DIAGNOSIS — I5031 Acute diastolic (congestive) heart failure: Secondary | ICD-10-CM | POA: Diagnosis present

## 2014-03-01 DIAGNOSIS — I5023 Acute on chronic systolic (congestive) heart failure: Secondary | ICD-10-CM | POA: Diagnosis present

## 2014-03-01 DIAGNOSIS — Z9861 Coronary angioplasty status: Secondary | ICD-10-CM | POA: Diagnosis not present

## 2014-03-01 DIAGNOSIS — I5022 Chronic systolic (congestive) heart failure: Secondary | ICD-10-CM

## 2014-03-01 DIAGNOSIS — Z8041 Family history of malignant neoplasm of ovary: Secondary | ICD-10-CM | POA: Diagnosis not present

## 2014-03-01 DIAGNOSIS — K219 Gastro-esophageal reflux disease without esophagitis: Secondary | ICD-10-CM | POA: Diagnosis present

## 2014-03-01 DIAGNOSIS — Z9010 Acquired absence of unspecified breast and nipple: Secondary | ICD-10-CM | POA: Diagnosis not present

## 2014-03-01 DIAGNOSIS — J209 Acute bronchitis, unspecified: Secondary | ICD-10-CM | POA: Diagnosis present

## 2014-03-01 DIAGNOSIS — I255 Ischemic cardiomyopathy: Secondary | ICD-10-CM

## 2014-03-01 LAB — CBC WITH DIFFERENTIAL/PLATELET
BASOS PCT: 0 % (ref 0–1)
Basophils Absolute: 0 10*3/uL (ref 0.0–0.1)
EOS ABS: 0 10*3/uL (ref 0.0–0.7)
EOS PCT: 0 % (ref 0–5)
HEMATOCRIT: 39.2 % (ref 36.0–46.0)
HEMOGLOBIN: 13.3 g/dL (ref 12.0–15.0)
LYMPHS ABS: 1.4 10*3/uL (ref 0.7–4.0)
Lymphocytes Relative: 11 % — ABNORMAL LOW (ref 12–46)
MCH: 33.2 pg (ref 26.0–34.0)
MCHC: 33.9 g/dL (ref 30.0–36.0)
MCV: 97.8 fL (ref 78.0–100.0)
MONO ABS: 0.6 10*3/uL (ref 0.1–1.0)
MONOS PCT: 5 % (ref 3–12)
Neutro Abs: 10.8 10*3/uL — ABNORMAL HIGH (ref 1.7–7.7)
Neutrophils Relative %: 84 % — ABNORMAL HIGH (ref 43–77)
Platelets: 245 10*3/uL (ref 150–400)
RBC: 4.01 MIL/uL (ref 3.87–5.11)
RDW: 12.7 % (ref 11.5–15.5)
WBC: 12.8 10*3/uL — ABNORMAL HIGH (ref 4.0–10.5)

## 2014-03-01 LAB — URINALYSIS, ROUTINE W REFLEX MICROSCOPIC
Bilirubin Urine: NEGATIVE
GLUCOSE, UA: NEGATIVE mg/dL
HGB URINE DIPSTICK: NEGATIVE
Ketones, ur: NEGATIVE mg/dL
Leukocytes, UA: NEGATIVE
Nitrite: NEGATIVE
PH: 5.5 (ref 5.0–8.0)
Protein, ur: NEGATIVE mg/dL
SPECIFIC GRAVITY, URINE: 1.017 (ref 1.005–1.030)
Urobilinogen, UA: 0.2 mg/dL (ref 0.0–1.0)

## 2014-03-01 LAB — PRO B NATRIURETIC PEPTIDE: Pro B Natriuretic peptide (BNP): 2785 pg/mL — ABNORMAL HIGH (ref 0–450)

## 2014-03-01 LAB — I-STAT CHEM 8, ED
BUN: 40 mg/dL — ABNORMAL HIGH (ref 6–23)
CREATININE: 1 mg/dL (ref 0.50–1.10)
Calcium, Ion: 1.05 mmol/L — ABNORMAL LOW (ref 1.13–1.30)
Chloride: 100 mEq/L (ref 96–112)
Glucose, Bld: 347 mg/dL — ABNORMAL HIGH (ref 70–99)
HCT: 44 % (ref 36.0–46.0)
Hemoglobin: 15 g/dL (ref 12.0–15.0)
POTASSIUM: 5.9 meq/L — AB (ref 3.7–5.3)
SODIUM: 129 meq/L — AB (ref 137–147)
TCO2: 23 mmol/L (ref 0–100)

## 2014-03-01 LAB — COMPREHENSIVE METABOLIC PANEL
ALK PHOS: 43 U/L (ref 39–117)
ALT: 17 U/L (ref 0–35)
ANION GAP: 20 — AB (ref 5–15)
AST: 44 U/L — ABNORMAL HIGH (ref 0–37)
Albumin: 4.1 g/dL (ref 3.5–5.2)
BILIRUBIN TOTAL: 0.6 mg/dL (ref 0.3–1.2)
BUN: 25 mg/dL — AB (ref 6–23)
CHLORIDE: 93 meq/L — AB (ref 96–112)
CO2: 17 meq/L — AB (ref 19–32)
CREATININE: 0.91 mg/dL (ref 0.50–1.10)
Calcium: 8.9 mg/dL (ref 8.4–10.5)
GFR calc Af Amer: 66 mL/min — ABNORMAL LOW (ref >90)
GFR, EST NON AFRICAN AMERICAN: 57 mL/min — AB (ref >90)
Glucose, Bld: 339 mg/dL — ABNORMAL HIGH (ref 70–99)
POTASSIUM: 6.1 meq/L — AB (ref 3.7–5.3)
Sodium: 130 mEq/L — ABNORMAL LOW (ref 137–147)
Total Protein: 7.8 g/dL (ref 6.0–8.3)

## 2014-03-01 LAB — I-STAT ARTERIAL BLOOD GAS, ED
Acid-base deficit: 5 mmol/L — ABNORMAL HIGH (ref 0.0–2.0)
Bicarbonate: 21.6 mEq/L (ref 20.0–24.0)
O2 Saturation: 96 %
PH ART: 7.282 — AB (ref 7.350–7.450)
TCO2: 23 mmol/L (ref 0–100)
pCO2 arterial: 45.8 mmHg — ABNORMAL HIGH (ref 35.0–45.0)
pO2, Arterial: 95 mmHg (ref 80.0–100.0)

## 2014-03-01 LAB — TROPONIN I: TROPONIN I: 1.04 ng/mL — AB (ref <0.30)

## 2014-03-01 LAB — I-STAT TROPONIN, ED: Troponin i, poc: 0.18 ng/mL (ref 0.00–0.08)

## 2014-03-01 LAB — I-STAT CG4 LACTIC ACID, ED: LACTIC ACID, VENOUS: 5.64 mmol/L — AB (ref 0.5–2.2)

## 2014-03-01 MED ORDER — FENTANYL CITRATE 0.05 MG/ML IJ SOLN
50.0000 ug | Freq: Once | INTRAMUSCULAR | Status: AC
  Administered 2014-03-02: 50 ug via INTRAVENOUS

## 2014-03-01 MED ORDER — MIDAZOLAM HCL 2 MG/2ML IJ SOLN
INTRAMUSCULAR | Status: AC
  Filled 2014-03-01: qty 2

## 2014-03-01 MED ORDER — CARVEDILOL 6.25 MG PO TABS
6.2500 mg | ORAL_TABLET | Freq: Two times a day (BID) | ORAL | Status: DC
  Administered 2014-03-02 – 2014-03-06 (×11): 6.25 mg via ORAL
  Filled 2014-03-01 (×12): qty 1

## 2014-03-01 MED ORDER — PROPOFOL 10 MG/ML IV EMUL
5.0000 ug/kg/min | INTRAVENOUS | Status: DC
  Administered 2014-03-01: 22.282 ug/kg/min via INTRAVENOUS
  Administered 2014-03-01: 33.422 ug/kg/min via INTRAVENOUS
  Administered 2014-03-02: 35 ug/kg/min via INTRAVENOUS
  Filled 2014-03-01 (×2): qty 100

## 2014-03-01 MED ORDER — NITROGLYCERIN IN D5W 200-5 MCG/ML-% IV SOLN
5.0000 ug/min | INTRAVENOUS | Status: DC
  Administered 2014-03-02: 5 ug/min via INTRAVENOUS
  Filled 2014-03-01: qty 250

## 2014-03-01 MED ORDER — ETOMIDATE 2 MG/ML IV SOLN
INTRAVENOUS | Status: AC
  Filled 2014-03-01: qty 20

## 2014-03-01 MED ORDER — ASPIRIN EC 81 MG PO TBEC
81.0000 mg | DELAYED_RELEASE_TABLET | Freq: Every day | ORAL | Status: DC
  Administered 2014-03-02 – 2014-03-06 (×4): 81 mg via ORAL
  Filled 2014-03-01 (×5): qty 1

## 2014-03-01 MED ORDER — ATORVASTATIN CALCIUM 80 MG PO TABS
80.0000 mg | ORAL_TABLET | Freq: Every day | ORAL | Status: DC
  Administered 2014-03-02 – 2014-03-06 (×5): 80 mg via ORAL
  Filled 2014-03-01 (×5): qty 1

## 2014-03-01 MED ORDER — FENTANYL CITRATE 0.05 MG/ML IJ SOLN
INTRAMUSCULAR | Status: AC
  Filled 2014-03-01: qty 2

## 2014-03-01 MED ORDER — FENTANYL CITRATE 0.05 MG/ML IJ SOLN
3.0000 ug/kg | INTRAMUSCULAR | Status: DC | PRN
Start: 2014-03-01 — End: 2014-03-01

## 2014-03-01 MED ORDER — SUCCINYLCHOLINE CHLORIDE 20 MG/ML IJ SOLN
INTRAMUSCULAR | Status: AC
  Filled 2014-03-01: qty 1

## 2014-03-01 MED ORDER — FUROSEMIDE 10 MG/ML IJ SOLN
40.0000 mg | Freq: Every day | INTRAMUSCULAR | Status: DC
  Administered 2014-03-03 – 2014-03-04 (×2): 40 mg via INTRAVENOUS
  Filled 2014-03-01 (×3): qty 4

## 2014-03-01 MED ORDER — HEPARIN (PORCINE) IN NACL 100-0.45 UNIT/ML-% IJ SOLN
750.0000 [IU]/h | INTRAMUSCULAR | Status: DC
  Administered 2014-03-02 – 2014-03-03 (×2): 750 [IU]/h via INTRAVENOUS
  Filled 2014-03-01 (×3): qty 250

## 2014-03-01 MED ORDER — ETOMIDATE 2 MG/ML IV SOLN
20.0000 mg | Freq: Once | INTRAVENOUS | Status: AC
  Administered 2014-03-01: 20 mg via INTRAVENOUS

## 2014-03-01 MED ORDER — ROCURONIUM BROMIDE 50 MG/5ML IV SOLN
INTRAVENOUS | Status: AC
  Filled 2014-03-01: qty 2

## 2014-03-01 MED ORDER — SODIUM CHLORIDE 0.9 % IV SOLN
0.0000 ug/h | INTRAVENOUS | Status: DC
  Administered 2014-03-01: 50 ug/h via INTRAVENOUS
  Filled 2014-03-01 (×2): qty 50

## 2014-03-01 MED ORDER — FENTANYL CITRATE 0.05 MG/ML IJ SOLN: 25.0000 ug | INTRAMUSCULAR | Status: DC | PRN

## 2014-03-01 MED ORDER — HEPARIN SODIUM (PORCINE) 5000 UNIT/ML IJ SOLN: 5000.0000 [IU] | Freq: Three times a day (TID) | INTRAMUSCULAR | Status: DC

## 2014-03-01 MED ORDER — FENTANYL CITRATE 0.05 MG/ML IJ SOLN
1.0000 ug/kg | INTRAMUSCULAR | Status: DC | PRN
  Administered 2014-03-01: 75 ug via INTRAVENOUS

## 2014-03-01 MED ORDER — SODIUM CHLORIDE 0.9 % IV SOLN: 250.0000 mL | INTRAVENOUS | Status: DC | PRN

## 2014-03-01 MED ORDER — MIDAZOLAM HCL 2 MG/2ML IJ SOLN
1.0000 mg | INTRAMUSCULAR | Status: DC | PRN
  Administered 2014-03-01: 1 mg via INTRAVENOUS

## 2014-03-01 MED ORDER — FUROSEMIDE 10 MG/ML IJ SOLN
60.0000 mg | Freq: Once | INTRAMUSCULAR | Status: AC
  Administered 2014-03-01: 60 mg via INTRAVENOUS
  Filled 2014-03-01: qty 6

## 2014-03-01 MED ORDER — LIDOCAINE HCL (CARDIAC) 20 MG/ML IV SOLN
INTRAVENOUS | Status: AC
  Filled 2014-03-01: qty 5

## 2014-03-01 MED ORDER — SPIRONOLACTONE 25 MG PO TABS
25.0000 mg | ORAL_TABLET | Freq: Every day | ORAL | Status: DC
  Administered 2014-03-02 – 2014-03-06 (×4): 25 mg
  Filled 2014-03-01 (×5): qty 1

## 2014-03-01 MED ORDER — HEPARIN BOLUS VIA INFUSION
3000.0000 [IU] | Freq: Once | INTRAVENOUS | Status: AC
Start: 2014-03-02 — End: 2014-03-02
  Administered 2014-03-02: 3000 [IU] via INTRAVENOUS
  Filled 2014-03-01: qty 3000

## 2014-03-01 MED ORDER — FENTANYL BOLUS VIA INFUSION: 25.0000 ug | INTRAVENOUS | Status: DC | PRN

## 2014-03-01 MED ORDER — SUCCINYLCHOLINE CHLORIDE 20 MG/ML IJ SOLN
100.0000 mg | Freq: Once | INTRAMUSCULAR | Status: AC
  Administered 2014-03-01: 100 mg via INTRAVENOUS

## 2014-03-01 MED ORDER — FUROSEMIDE 10 MG/ML IJ SOLN
40.0000 mg | Freq: Four times a day (QID) | INTRAMUSCULAR | Status: AC
  Administered 2014-03-02 (×2): 40 mg via INTRAVENOUS
  Filled 2014-03-01 (×2): qty 4

## 2014-03-01 MED ORDER — SIMVASTATIN 40 MG PO TABS: 40.0000 mg | ORAL_TABLET | Freq: Every day | ORAL | Status: DC

## 2014-03-01 NOTE — ED Notes (Signed)
Pt woke with shortness of breath.  CHF hx.  Very labored breathing with long exp resp.  On bipap by ems.  Diaphoretic, cool to touch.  Initial sats 80's, now 94.  2 SL NTG en route.  Dr Venora Maples into room.  Plan to intubate.

## 2014-03-01 NOTE — Progress Notes (Addendum)
Pt arrived on CPAP with SOB and increased WOB. Pt was intubated by ED MD with Mac 4 and 7.5 ETT, with one attempt. Placed on setting of 8cc IDBW. Placement confirmed by color change on ez cap, BiLateral breath sounds, misting in tube and now awaiting CXR. CXR confirmed placement.

## 2014-03-01 NOTE — Consult Note (Signed)
Reason for Consult: Acute pulmonary edema/minimally elevated troponin I. Referring Physician: CCM  Haley Harris is an 80 y.o. female.  HPI: Patient is 80 year old female with past medical history significant for coronary artery disease history of non-Q-wave myocardial infarction in November of 2012 requiring PTCA stenting to distal RCA (3.0x16 mm long promos drug-eluting stent ), ischemic artery myopathy EF approximately 40-45%, ischemic cardiomyopathy, hypertension, COPD, history of cerebrovascular disease, degenerative joint disease, history of spinal stenosis, peripheral vascular disease, hypercholesteremia, GERD, depression, history of CA of breast in the past, came to the ER by EMS complaining of progressive increasing shortness of breath which started this afternoon and was noted to be in acute pulmonary edema requiring intubation in the ED. As per daughter patient went to [place] with her friends last Friday came back Tuesday and was doing well until  yesterday when she ate clamps and developed nausea vomiting and diarrhea and could not keep anything down and did not take any medication this afternoon patient suddenly became short of breath and called EMS and came to the ER. Patient denied any chest pain. Patient was noted to be hypoxic acidotic requiring intubation in ED. Patient was noted to have possibly rate-related left bundle branch block and was noted to have minimally elevated troponin I.  Past Medical History  Diagnosis Date  . Allergic rhinitis, cause unspecified   . Unspecified cardiovascular disease   . Personal history of malignant neoplasm of breast   . Occlusion and stenosis of carotid artery without mention of cerebral infarction   . Chronic airway obstruction, not elsewhere classified   . Depressive disorder, not elsewhere classified   . Other dyspnea and respiratory abnormality   . Esophageal reflux   . Unspecified hearing loss   . Other and unspecified hyperlipidemia   .  Unspecified essential hypertension   . Osteoporosis, unspecified   . Peripheral vascular disease, unspecified   . Unspecified urinary incontinence   . Spinal stenosis   . ACE-inhibitor cough   . Angina   . Heart murmur     aS CHILD  . Shortness of breath   . Recurrent upper respiratory infection (URI)   . Anxiety   . Pneumonia   . 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 Myocardial infarct   . Osteoarthrosis, unspecified whether generalized or localized, unspecified site   . Malignant neoplasm of breast (female), unspecified site     Past Surgical History  Procedure Laterality Date  . Mastectomy  1987    Right  . Breast reconstruction  1998    Reconstruction   . Reduction mammaplasty  1998    Left  . Mastoidectomy  childhood  . Appendectomy  1948  . Lumbar laminectomy  2003  . Shoulder surgery  02/2005    Bilateral fractures with multiple surgeries  . Breast implant removal  06/12/09    right  . Abdominal hysterectomy    . Fracture surgery      fracture right elbow  . Coronary angioplasty  11/12    distal RCA  . Mastectomy      Family History  Problem Relation Age of Onset  . Heart attack Father 91  . Cancer Mother     ovarian  . Cancer Brother     lung  . Arthritis      family    Social History:  reports that she quit smoking about 39 years ago. She has never used smokeless tobacco. She reports that she  drinks alcohol. She reports that she does not use illicit drugs.  Allergies:  Allergies  Allergen Reactions  . Amoxicillin-Pot Clavulanate Anaphylaxis  . Amoxicillin     REACTION: Angioedema requiring intubation  . Cephalexin Other (See Comments)    Reaction unknown  . Clarithromycin Other (See Comments)    Reaction unknown  . Nifedipine Other (See Comments)    Reaction unknown  . Nsaids Other (See Comments)    Heart issue  . Olmesartan Medoxomil     REACTION: cough  . Tramadol Hcl Other (See Comments)    Reaction unknown     Medications: I have reviewed the patient's current medications.  Results for orders placed during the hospital encounter of 03/01/14 (from the past 48 hour(s))  CBC WITH DIFFERENTIAL     Status: Abnormal   Collection Time    03/01/14  7:30 PM      Result Value Ref Range   WBC 12.8 (*) 4.0 - 10.5 K/uL   RBC 4.01  3.87 - 5.11 MIL/uL   Hemoglobin 13.3  12.0 - 15.0 g/dL   HCT 39.2  36.0 - 46.0 %   MCV 97.8  78.0 - 100.0 fL   MCH 33.2  26.0 - 34.0 pg   MCHC 33.9  30.0 - 36.0 g/dL   RDW 12.7  11.5 - 15.5 %   Platelets 245  150 - 400 K/uL   Neutrophils Relative % 84 (*) 43 - 77 %   Neutro Abs 10.8 (*) 1.7 - 7.7 K/uL   Lymphocytes Relative 11 (*) 12 - 46 %   Lymphs Abs 1.4  0.7 - 4.0 K/uL   Monocytes Relative 5  3 - 12 %   Monocytes Absolute 0.6  0.1 - 1.0 K/uL   Eosinophils Relative 0  0 - 5 %   Eosinophils Absolute 0.0  0.0 - 0.7 K/uL   Basophils Relative 0  0 - 1 %   Basophils Absolute 0.0  0.0 - 0.1 K/uL  COMPREHENSIVE METABOLIC PANEL     Status: Abnormal   Collection Time    03/01/14  7:30 PM      Result Value Ref Range   Sodium 130 (*) 137 - 147 mEq/L   Potassium 6.1 (*) 3.7 - 5.3 mEq/L   Comment: HEMOLYSIS AT THIS LEVEL MAY AFFECT RESULT   Chloride 93 (*) 96 - 112 mEq/L   CO2 17 (*) 19 - 32 mEq/L   Glucose, Bld 339 (*) 70 - 99 mg/dL   BUN 25 (*) 6 - 23 mg/dL   Creatinine, Ser 0.91  0.50 - 1.10 mg/dL   Calcium 8.9  8.4 - 10.5 mg/dL   Total Protein 7.8  6.0 - 8.3 g/dL   Albumin 4.1  3.5 - 5.2 g/dL   AST 44 (*) 0 - 37 U/L   Comment: HEMOLYSIS AT THIS LEVEL MAY AFFECT RESULT   ALT 17  0 - 35 U/L   Comment: HEMOLYSIS AT THIS LEVEL MAY AFFECT RESULT   Alkaline Phosphatase 43  39 - 117 U/L   Comment: HEMOLYSIS AT THIS LEVEL MAY AFFECT RESULT   Total Bilirubin 0.6  0.3 - 1.2 mg/dL   GFR calc non Af Amer 57 (*) >90 mL/min   GFR calc Af Amer 66 (*) >90 mL/min   Comment: (NOTE)     The eGFR has been calculated using the CKD EPI equation.     This calculation has not been  validated in all clinical situations.     eGFR's  persistently <90 mL/min signify possible Chronic Kidney     Disease.   Anion gap 20 (*) 5 - 15  PRO B NATRIURETIC PEPTIDE     Status: Abnormal   Collection Time    03/01/14  7:30 PM      Result Value Ref Range   Pro B Natriuretic peptide (BNP) 2785.0 (*) 0 - 450 pg/mL  I-STAT TROPOININ, ED     Status: Abnormal   Collection Time    03/01/14  7:42 PM      Result Value Ref Range   Troponin i, poc 0.18 (*) 0.00 - 0.08 ng/mL   Comment NOTIFIED PHYSICIAN     Comment 3            Comment: Due to the release kinetics of cTnI,     a negative result within the first hours     of the onset of symptoms does not rule out     myocardial infarction with certainty.     If myocardial infarction is still suspected,     repeat the test at appropriate intervals.  I-STAT CHEM 8, ED     Status: Abnormal   Collection Time    03/01/14  7:44 PM      Result Value Ref Range   Sodium 129 (*) 137 - 147 mEq/L   Potassium 5.9 (*) 3.7 - 5.3 mEq/L   Chloride 100  96 - 112 mEq/L   BUN 40 (*) 6 - 23 mg/dL   Creatinine, Ser 1.00  0.50 - 1.10 mg/dL   Glucose, Bld 347 (*) 70 - 99 mg/dL   Calcium, Ion 1.05 (*) 1.13 - 1.30 mmol/L   TCO2 23  0 - 100 mmol/L   Hemoglobin 15.0  12.0 - 15.0 g/dL   HCT 44.0  36.0 - 46.0 %  I-STAT CG4 LACTIC ACID, ED     Status: Abnormal   Collection Time    03/01/14  7:44 PM      Result Value Ref Range   Lactic Acid, Venous 5.64 (*) 0.5 - 2.2 mmol/L  URINALYSIS, ROUTINE W REFLEX MICROSCOPIC     Status: Abnormal   Collection Time    03/01/14  7:46 PM      Result Value Ref Range   Color, Urine AMBER (*) YELLOW   Comment: BIOCHEMICALS MAY BE AFFECTED BY COLOR   APPearance CLEAR  CLEAR   Specific Gravity, Urine 1.017  1.005 - 1.030   pH 5.5  5.0 - 8.0   Glucose, UA NEGATIVE  NEGATIVE mg/dL   Hgb urine dipstick NEGATIVE  NEGATIVE   Bilirubin Urine NEGATIVE  NEGATIVE   Ketones, ur NEGATIVE  NEGATIVE mg/dL   Protein, ur NEGATIVE   NEGATIVE mg/dL   Urobilinogen, UA 0.2  0.0 - 1.0 mg/dL   Nitrite NEGATIVE  NEGATIVE   Leukocytes, UA NEGATIVE  NEGATIVE   Comment: MICROSCOPIC NOT DONE ON URINES WITH NEGATIVE PROTEIN, BLOOD, LEUKOCYTES, NITRITE, OR GLUCOSE <1000 mg/dL.  I-STAT ARTERIAL BLOOD GAS, ED     Status: Abnormal   Collection Time    03/01/14  8:26 PM      Result Value Ref Range   pH, Arterial 7.282 (*) 7.350 - 7.450   pCO2 arterial 45.8 (*) 35.0 - 45.0 mmHg   pO2, Arterial 95.0  80.0 - 100.0 mmHg   Bicarbonate 21.6  20.0 - 24.0 mEq/L   TCO2 23  0 - 100 mmol/L   O2 Saturation 96.0     Acid-base deficit 5.0 (*)  0.0 - 2.0 mmol/L   Collection site RADIAL, ALLEN'S TEST ACCEPTABLE     Drawn by Operator     Sample type ARTERIAL      Dg Chest Portable 1 View  03/01/2014   CLINICAL DATA:  Intubation.  EXAM: PORTABLE CHEST - 1 VIEW  COMPARISON:  07/21/2011  FINDINGS: The endotracheal tube is 2.9 cm above the carina. The NG tube is in the stomach. The heart is mildly enlarged but stable. There is tortuosity and calcification of the thoracic aorta. There is a diffuse large interstitial process in the lungs which is likely interstitial pulmonary edema. No large pleural effusion. The bony thorax is intact.  IMPRESSION: The endotracheal tube and NG tubes are in good position.  Diffuse interstitial process, likely edema.   Electronically Signed   By: Kalman Jewels M.D.   On: 03/01/2014 20:37    Review of Systems  Unable to perform ROS: intubated   Blood pressure 100/62, pulse 68, temperature 97.5 F (36.4 C), resp. rate 15, height _0  (1.651 m), weight 74.844 kg (165 lb), SpO2 100.00%. Physical Exam  Eyes: Conjunctivae are normal. Left eye exhibits no discharge. No scleral icterus.  Neck: Normal range of motion. Neck supple. JVD present.  Cardiovascular: Normal rate and regular rhythm.   Murmur (soft systolic murmur and S3 gallop noted) heard. Respiratory:  Decreased breath sound at bases with bilateral rales noted   GI: Soft. Bowel sounds are normal. She exhibits no distension. There is no tenderness. There is no rebound.  Musculoskeletal:  No clubbing cyanosis trace edema noted. Extremities cold    Assessment/Plan: Acute pulmonary edema rule out ischemia rule out MI Acute hypoxic respiratory failure secondary to above Coronary artery disease history of non-Q-wave MI in the past status post PCI to distal RCA has about November of 2012 Ischemic cardiomyopathy Possible food poisoning Hypertension COPD History of cerebrovascular disease Degenerative joint disease Peripheral vascular disease Hypercholesteremia GERD History of CA of breast Hyperkalemia Plan Check serial enzymes EKG Check 2-D echo and check LV systolic function and check for segmental wall motion abnormalities Agree with aspirin IV Lasix heparin nitrates statins. We'll start low-dose beta blockers and ACE inhibitors once fully compensated and extubated Will discuss further with family regarding further management once extubated Hayes Rehfeldt N 03/01/2014, 11:07 PM

## 2014-03-01 NOTE — Progress Notes (Signed)
ANTICOAGULATION CONSULT NOTE - Initial Consult  Pharmacy Consult for Heparin  Indication: chest pain/ACS  Allergies  Allergen Reactions  . Amoxicillin-Pot Clavulanate Anaphylaxis  . Amoxicillin     REACTION: Angioedema requiring intubation  . Cephalexin Other (See Comments)    Reaction unknown  . Clarithromycin Other (See Comments)    Reaction unknown  . Nifedipine Other (See Comments)    Reaction unknown  . Nsaids Other (See Comments)    Heart issue  . Olmesartan Medoxomil     REACTION: cough  . Tramadol Hcl Other (See Comments)    Reaction unknown    Patient Measurements: Height: 5\' 5"  (165.1 cm) Weight: 165 lb (74.844 kg) IBW/kg (Calculated) : 57  Vital Signs: Temp: 97.3 F (36.3 C) (08/20 2300) BP: 112/62 mmHg (08/20 2300) Pulse Rate: 71 (08/20 2300)  Labs:  Recent Labs  03/01/14 1930 03/01/14 1944 03/01/14 2245  HGB 13.3 15.0  --   HCT 39.2 44.0  --   PLT 245  --   --   CREATININE 0.91 1.00  --   TROPONINI  --   --  1.04*    Estimated Creatinine Clearance: 43.9 ml/min (by C-G formula based on Cr of 1).   Medical History: Past Medical History  Diagnosis Date  . Allergic rhinitis, cause unspecified   . Unspecified cardiovascular disease   . Personal history of malignant neoplasm of breast   . Occlusion and stenosis of carotid artery without mention of cerebral infarction   . Chronic airway obstruction, not elsewhere classified   . Depressive disorder, not elsewhere classified   . Other dyspnea and respiratory abnormality   . Esophageal reflux   . Unspecified hearing loss   . Other and unspecified hyperlipidemia   . Unspecified essential hypertension   . Osteoporosis, unspecified   . Peripheral vascular disease, unspecified   . Unspecified urinary incontinence   . Spinal stenosis   . ACE-inhibitor cough   . Angina   . Heart murmur     aS CHILD  . Shortness of breath   . Recurrent upper respiratory infection (URI)   . Anxiety   . Pneumonia    . 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 Myocardial infarct   . Osteoarthrosis, unspecified whether generalized or localized, unspecified site   . Malignant neoplasm of breast (female), unspecified site     Assessment: 80 y/o F to start heparin for mildly elevated troponin. CBC/renal function ok, other labs as above.   Goal of Therapy:  Heparin level 0.3-0.7 units/ml Monitor platelets by anticoagulation protocol: Yes   Plan:  -Heparin 3000 units BOLUS -Start heparin drip at 750 units/hr -0800 HL -Daily CBC/HL -Monitor for bleeding  Narda Bonds 03/01/2014,11:44 PM

## 2014-03-01 NOTE — ED Provider Notes (Signed)
  Physical Exam  BP 140/102  Pulse 103  Resp 22  Ht 5\' 5"  (1.651 m)  Wt 165 lb (74.844 kg)  BMI 27.46 kg/m2  SpO2 98%  Physical Exam  ED Course  INTUBATION Date/Time: 03/01/2014 7:59 PM Performed by: Deno Etienne Authorized by: Deno Etienne Consent: Verbal consent obtained. Risks and benefits: risks, benefits and alternatives were discussed Consent given by: patient Patient identity confirmed: arm band Indications: respiratory distress and  respiratory failure Intubation method: direct Patient status: paralyzed (RSI) Sedatives: etomidate Paralytic: succinylcholine Laryngoscope size: Mac 4 Tube size: 7.5 mm Number of attempts: 1 Cricoid pressure: no Cords visualized: no Post-procedure assessment: chest rise and ETCO2 monitor Breath sounds: equal Cuff inflated: yes ETT to lip: 23 cm Tube secured with: ETT holder Chest x-ray interpreted by me. Chest x-ray findings: endotracheal tube in appropriate position        Deno Etienne, MD 03/02/14 0005

## 2014-03-01 NOTE — Progress Notes (Signed)
RT obtained ABG and showed results to MD.

## 2014-03-01 NOTE — ED Notes (Signed)
Pt continues to bite ET, wakens to speak to family.  Lights down low, asked pt family to allow her to relax and work with the medication.

## 2014-03-01 NOTE — ED Provider Notes (Addendum)
CSN: [id number]     Arrival date & time 03/01/14  1921 History   First MD Initiated Contact with Patient 03/01/14 1935     Chief Complaint  Patient presents with  . Shortness of Breath     Level V caveat: Respiratory distress  HPI The majority history is obtained from family and EMS.  The patient was out of town over the past several days over the past 24 hours developed some nausea and diarrhea with some occasional vomiting.  She did not complain of chest pain or shortness of breath.  Family reports that she suddenly became more short of breath and seemed to be in distress and thus EMS was called.  EMS report hypoxia on their arrival with O2 sats of 80%.  She did complain of some chest discomfort at that time per EMS.  She immediately was placed on CPAP and was emergently brought to the ER.  On arrival to the emergency department the patient is obvious extremities.  She was able to not her head yes and no.  She denied productive cough and fever.  She was agreeable to intubation for airway protection and distress.  Hypertensive in the ER to 160/100 on arrival to the ER.  Heart rate in the 120s.  Heart rate appears to be sinus rhythm.   Past Medical History  Diagnosis Date  . Allergic rhinitis, cause unspecified   . Unspecified cardiovascular disease   . Personal history of malignant neoplasm of breast   . Occlusion and stenosis of carotid artery without mention of cerebral infarction   . Chronic airway obstruction, not elsewhere classified   . Depressive disorder, not elsewhere classified   . Other dyspnea and respiratory abnormality   . Esophageal reflux   . Unspecified hearing loss   . Other and unspecified hyperlipidemia   . Unspecified essential hypertension   . Osteoporosis, unspecified   . Peripheral vascular disease, unspecified   . Unspecified urinary incontinence   . Spinal stenosis   . ACE-inhibitor cough   . Angina   . Heart murmur     aS CHILD  . Shortness of breath    . Recurrent upper respiratory infection (URI)   . Anxiety   . Pneumonia   . 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 Myocardial infarct   . Osteoarthrosis, unspecified whether generalized or localized, unspecified site   . Malignant neoplasm of breast (female), unspecified site    Past Surgical History  Procedure Laterality Date  . Mastectomy  1987    Right  . Breast reconstruction  1998    Reconstruction   . Reduction mammaplasty  1998    Left  . Mastoidectomy  childhood  . Appendectomy  1948  . Lumbar laminectomy  2003  . Shoulder surgery  02/2005    Bilateral fractures with multiple surgeries  . Breast implant removal  06/12/09    right  . Abdominal hysterectomy    . Fracture surgery      fracture right elbow  . Coronary angioplasty  11/12    distal RCA  . Mastectomy     Family History  Problem Relation Age of Onset  . Heart attack Father 63  . Cancer Mother     ovarian  . Cancer Brother     lung  . Arthritis      family   History  Substance Use Topics  . Smoking status: Former Smoker    Quit date: 07/13/1974  . Smokeless  tobacco: Never Used  . Alcohol Use: Yes     Comment: occasional   OB History   Grav Para Term Preterm Abortions TAB SAB Ect Mult Living                 Review of Systems  Unable to perform ROS: Acuity of condition      Allergies  Amoxicillin-pot clavulanate; Amoxicillin; Cephalexin; Clarithromycin; Nifedipine; Nsaids; Olmesartan medoxomil; and Tramadol hcl  Home Medications   Prior to Admission medications   Medication Sig Start Date End Date Taking? Authorizing Provider  albuterol (PROVENTIL) (2.5 MG/3ML) 0.083% nebulizer solution Take 2.5 mg by nebulization every 6 (six) hours as needed. For shortness of breath    Historical Provider, MD  aspirin 81 MG tablet Take 81 mg by mouth daily after lunch.     Historical Provider, MD  Calcium Carbonate-Vitamin D (CALTRATE 600+D PO) Take 1 tablet by mouth daily after  lunch.     Historical Provider, MD  carvedilol (COREG) 12.5 MG tablet TAKE ONE (1) TABLET BY MOUTH TWO (2) TIMES DAILY WITH A MEAL    Venia Carbon, MD  cetirizine (ZYRTEC) 10 MG tablet Take 10 mg by mouth daily as needed. For allergies    Historical Provider, MD  famotidine (PEPCID) 20 MG tablet Take 20 mg by mouth 2 (two) times daily.  02/11/12   Historical Provider, MD  FLUoxetine (PROZAC) 20 MG capsule TAKE ONE CAPSULE BY MOUTH DAILY 03/08/13   Venia Carbon, MD  fluticasone [facility]) 50 MCG/ACT nasal spray INHALE TWO (2) SPRAYS INTO THE NOSE DAILY AS DIRECTED 03/30/13   Venia Carbon, MD  furosemide (LASIX) 40 MG tablet TAKE ONE (1) TABLET BY MOUTH EVERY DAY 07/01/13   Venia Carbon, MD  gabapentin (NEURONTIN) 100 MG capsule Take 100 mg by mouth 2 (two) times daily.    Historical Provider, MD  losartan (COZAAR) 50 MG tablet TAKE ONE (1) TABLET BY MOUTH EVERY DAY 03/08/13   Venia Carbon, MD  montelukast (SINGULAIR) 10 MG tablet TAKE ONE TABLET BY MOUTH DAILY AS NEEDEDFOR SEVERE RESISTANT ALLERGY SYMPTOMS 03/08/13   Venia Carbon, MD  Multiple Vitamin (MULITIVITAMIN WITH MINERALS) TABS Take 1 tablet by mouth daily after lunch.     Historical Provider, MD  oxyCODONE-acetaminophen (PERCOCET) 5-325 MG per tablet Take 2 tablets by mouth every 4 (four) hours as needed for pain.     Linus Mako, PA-C  pravastatin (PRAVACHOL) 80 MG tablet TAKE ONE TABLET BY MOUTH EVERY NIGHT AT BEDTIME 04/25/13   Venia Carbon, MD  spironolactone (ALDACTONE) 25 MG tablet TAKE 1 TABLET BY MOUTH DAILY 08/09/13   Venia Carbon, MD  vitamin B-12 (CYANOCOBALAMIN) 500 MCG tablet Take 1,000 mcg by mouth 3 (three) times a week. Mon, Wed, Fri    Historical Provider, MD   BP 93/54  Pulse 78  Temp(Src) 98.2 F (36.8 C)  Resp 16  Ht 5\' 5"  (1.651 m)  Wt 165 lb (74.844 kg)  BMI 27.46 kg/m2  SpO2 99% Physical Exam  Nursing note and vitals reviewed. Constitutional: She appears distressed.  HENT:   Head: Normocephalic and atraumatic.  Eyes: EOM are normal. Pupils are equal, round, and reactive to light.  Neck: Normal range of motion. Neck supple.  Cardiovascular:  Tachycardic  Pulmonary/Chest:  Respiratory distress.  Accessory muscle use.  Tachypnea.  Abdominal: She exhibits no distension. There is no tenderness.  Musculoskeletal: Normal range of motion. She exhibits edema.  Neurological: She is  alert.  Follow simple commands.  Moves all extremities  Skin: She is diaphoretic.  Cold and clammy  Psychiatric: She has a normal mood and affect.    ED Course  Procedures (including critical care time)  CRITICAL CARE Performed by: Hoy Morn Total critical care time: 35 Critical care time was exclusive of separately billable procedures and treating other patients. Critical care was necessary to treat or prevent imminent or life-threatening deterioration. Critical care was time spent personally by me on the following activities: development of treatment plan with patient and/or surrogate as well as nursing, discussions with consultants, evaluation of patient's response to treatment, examination of patient, obtaining history from patient or surrogate, ordering and performing treatments and interventions, ordering and review of laboratory studies, ordering and review of radiographic studies, pulse oximetry and re-evaluation of patient's condition.  INTUBATION Performed by: Deno Etienne Please see separate note I was personally present and supervised the intubation by my resident Dr Greer Ee  Labs Review Labs Reviewed  CBC WITH DIFFERENTIAL - Abnormal; Notable for the following:    WBC 12.8 (*)    Neutrophils Relative % 84 (*)    Neutro Abs 10.8 (*)    Lymphocytes Relative 11 (*)    All other components within normal limits  COMPREHENSIVE METABOLIC PANEL - Abnormal; Notable for the following:    Sodium 130 (*)    Potassium 6.1 (*)    Chloride 93 (*)    CO2 17 (*)    Glucose,  Bld 339 (*)    BUN 25 (*)    AST 44 (*)    GFR calc non Af Amer 57 (*)    GFR calc Af Amer 66 (*)    Anion gap 20 (*)    All other components within normal limits  URINALYSIS, ROUTINE W REFLEX MICROSCOPIC - Abnormal; Notable for the following:    Color, Urine AMBER (*)    All other components within normal limits  PRO B NATRIURETIC PEPTIDE - Abnormal; Notable for the following:    Pro B Natriuretic peptide (BNP) 2785.0 (*)    All other components within normal limits  I-STAT TROPOININ, ED - Abnormal; Notable for the following:    Troponin i, poc 0.18 (*)    All other components within normal limits  I-STAT CHEM 8, ED - Abnormal; Notable for the following:    Sodium 129 (*)    Potassium 5.9 (*)    BUN 40 (*)    Glucose, Bld 347 (*)    Calcium, Ion 1.05 (*)    All other components within normal limits  I-STAT CG4 LACTIC ACID, ED - Abnormal; Notable for the following:    Lactic Acid, Venous 5.64 (*)    All other components within normal limits  I-STAT ARTERIAL BLOOD GAS, ED - Abnormal; Notable for the following:    pH, Arterial 7.282 (*)    pCO2 arterial 45.8 (*)    Acid-base deficit 5.0 (*)    All other components within normal limits    Imaging Review Dg Chest Portable 1 View  03/01/2014   CLINICAL DATA:  Intubation.  EXAM: PORTABLE CHEST - 1 VIEW  COMPARISON:  07/21/2011  FINDINGS: The endotracheal tube is 2.9 cm above the carina. The NG tube is in the stomach. The heart is mildly enlarged but stable. There is tortuosity and calcification of the thoracic aorta. There is a diffuse large interstitial process in the lungs which is likely interstitial pulmonary edema. No large pleural effusion. The bony thorax  is intact.  IMPRESSION: The endotracheal tube and NG tubes are in good position.  Diffuse interstitial process, likely edema.   Electronically Signed   By: Kalman Jewels M.D.   On: 03/01/2014 20:37  I personally reviewed the imaging tests through PACS system I reviewed  available ER/hospitalization records through the EMR    EKG Interpretation   Date/Time:  Thursday March 01 2014 20:04:01 EDT Ventricular Rate:  102 PR Interval:  169 QRS Duration: 143 QT Interval:  406 QTC Calculation: 529 R Axis:   -81 Text Interpretation:  Sinus tachycardia Left bundle branch block No  significant change was found Reconfirmed by Kenn Rekowski  MD, Lennette Bihari ([id number]) on  03/01/2014 9:00:06 PM      MDM   Final diagnoses:  Acute respiratory failure with hypoxemia    Significant respiratory distress on arrival.  Hypoxia.  Patient required intubation for airway protection and respiratory failure.  Appears to be likely CHF exacerbation with flash pulmonary edema.  Diuretics given.  Admit to the hospital.  Mild lobular component.  Lactate noted at 5.  Doubt sepsis.    Hoy Morn, MD 03/02/14 [place], MD 03/14/14 [phone]

## 2014-03-01 NOTE — H&P (Signed)
PULMONARY / CRITICAL CARE MEDICINE   Name: Haley Harris MRN: [id number] DOB: 22-Mar-1929    ADMISSION DATE:  03/01/2014 CONSULTATION DATE:  03/01/2014  REFERRING MD :  EDP  CHIEF COMPLAINT:  SOB  INITIAL PRESENTATION: 80 year old female with CHF presented 8/20 with respiratory distress. In ED she was found to be hypoxic and required intubation. CXR concerning for edema. PCCM asked to see for admission.   STUDIES:    SIGNIFICANT EVENTS: 8/20 > intubated, admitted to ICU  HISTORY OF PRESENT ILLNESS:  79 year old female with PMH as below, which includes CHF, HTN, and CAD presented to [facility] ED 8/20. She developed vomiting and diarrhea over the past 24 hours after eating some leftovers. As a result of this she was unable to take her regular medications which include several medications for HF and diuretics. She became acutely SOB and EMS was called. Upon their arrival she was in respiratory distress, and complaining of chest discomfort. She was placed on NIMV. At time of presentation to ED she was hypoxic and CXR was consistent with edema. She was intubated in ED. PCCM asked to see for admission.   PAST MEDICAL HISTORY :  Past Medical History  Diagnosis Date  . Allergic rhinitis, cause unspecified   . Unspecified cardiovascular disease   . Personal history of malignant neoplasm of breast   . Occlusion and stenosis of carotid artery without mention of cerebral infarction   . Chronic airway obstruction, not elsewhere classified   . Depressive disorder, not elsewhere classified   . Other dyspnea and respiratory abnormality   . Esophageal reflux   . Unspecified hearing loss   . Other and unspecified hyperlipidemia   . Unspecified essential hypertension   . Osteoporosis, unspecified   . Peripheral vascular disease, unspecified   . Unspecified urinary incontinence   . Spinal stenosis   . ACE-inhibitor cough   . Angina   . Heart murmur     aS CHILD  . Shortness of breath   . Recurrent  upper respiratory infection (URI)   . Anxiety   . Pneumonia   . 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 Myocardial infarct   . Osteoarthrosis, unspecified whether generalized or localized, unspecified site   . Malignant neoplasm of breast (female), unspecified site    Past Surgical History  Procedure Laterality Date  . Mastectomy  1987    Right  . Breast reconstruction  1998    Reconstruction   . Reduction mammaplasty  1998    Left  . Mastoidectomy  childhood  . Appendectomy  1948  . Lumbar laminectomy  2003  . Shoulder surgery  02/2005    Bilateral fractures with multiple surgeries  . Breast implant removal  06/12/09    right  . Abdominal hysterectomy    . Fracture surgery      fracture right elbow  . Coronary angioplasty  11/12    distal RCA  . Mastectomy     Prior to Admission medications   Medication Sig Start Date End Date Taking? Authorizing Provider  aspirin 81 MG tablet Take 81 mg by mouth daily after lunch.    Yes Historical Provider, MD  Calcium Carbonate-Vitamin D (CALTRATE 600+D PO) Take 1 tablet by mouth daily after lunch.    Yes Historical Provider, MD  carvedilol (COREG) 12.5 MG tablet Take 12.5 mg by mouth 2 (two) times daily with a meal.   Yes Historical Provider, MD  cetirizine (ZYRTEC) 10  MG tablet Take 10 mg by mouth daily. For allergies   Yes Historical Provider, MD  denosumab (PROLIA) 60 MG/ML SOLN injection Inject 60 mg into the skin every 6 (six) months. Administer in upper arm, thigh, or abdomen   Yes Historical Provider, MD  famotidine (PEPCID) 20 MG tablet Take 20 mg by mouth 2 (two) times daily.  02/11/12  Yes Historical Provider, MD  FLUoxetine (PROZAC) 20 MG capsule Take 20 mg by mouth daily.   Yes Historical Provider, MD  fluticasone (FLONASE) 50 MCG/ACT nasal spray Place 2 sprays into both nostrils daily as needed for allergies or rhinitis.   Yes Historical Provider, MD  furosemide (LASIX) 40 MG tablet Take 40 mg by mouth daily.   Yes  Historical Provider, MD  gabapentin (NEURONTIN) 100 MG capsule Take 100 mg by mouth 2 (two) times daily.   Yes Historical Provider, MD  losartan (COZAAR) 50 MG tablet Take 50 mg by mouth daily.   Yes Historical Provider, MD  montelukast (SINGULAIR) 10 MG tablet Take 10 mg by mouth daily.   Yes Historical Provider, MD  Multiple Vitamin (MULITIVITAMIN WITH MINERALS) TABS Take 1 tablet by mouth daily after lunch.    Yes Historical Provider, MD  oxyCODONE-acetaminophen (PERCOCET) 5-325 MG per tablet Take 2 tablets by mouth every 4 (four) hours as needed for pain.    Yes Linus Mako, PA-C  pravastatin (PRAVACHOL) 80 MG tablet Take 80 mg by mouth daily.   Yes Historical Provider, MD  spironolactone (ALDACTONE) 25 MG tablet Take 25 mg by mouth daily.   Yes Historical Provider, MD  vitamin B-12 (CYANOCOBALAMIN) 500 MCG tablet Take 1,000 mcg by mouth 3 (three) times a week. Mon, Wed, Fri   Yes Historical Provider, MD   Allergies  Allergen Reactions  . Amoxicillin-Pot Clavulanate Anaphylaxis  . Amoxicillin     REACTION: Angioedema requiring intubation  . Cephalexin Other (See Comments)    Reaction unknown  . Clarithromycin Other (See Comments)    Reaction unknown  . Nifedipine Other (See Comments)    Reaction unknown  . Nsaids Other (See Comments)    Heart issue  . Olmesartan Medoxomil     REACTION: cough  . Tramadol Hcl Other (See Comments)    Reaction unknown    FAMILY HISTORY:  Family History  Problem Relation Age of Onset  . Heart attack Father 62  . Cancer Mother     ovarian  . Cancer Brother     lung  . Arthritis      family   SOCIAL HISTORY:  reports that she quit smoking about 39 years ago. She has never used smokeless tobacco. She reports that she drinks alcohol. She reports that she does not use illicit drugs.  REVIEW OF SYSTEMS:  Unable as patient is intubated  SUBJECTIVE:   VITAL SIGNS: Temp:  [97 F (36.1 C)-98.2 F (36.8 C)] 97.5 F (36.4 C) (08/20  2130) Pulse Rate:  [70-123] 70 (08/20 2130) Resp:  [14-40] 16 (08/20 2130) BP: (84-168)/(54-134) 84/60 mmHg (08/20 2130) SpO2:  [84 %-100 %] 98 % (08/20 2130) FiO2 (%):  [80 %] 80 % (08/20 1940) Weight:  [74.844 kg (165 lb)] 74.844 kg (165 lb) (08/20 1926) HEMODYNAMICS:   VENTILATOR SETTINGS: Vent Mode:  [-] PRVC FiO2 (%):  [80 %] 80 % Set Rate:  [16 bmp] 16 bmp Vt Set:  [450 mL] 450 mL PEEP:  [5 cmH20] 5 cmH20 Plateau Pressure:  [18 cmH20] 18 cmH20 INTAKE / OUTPUT: No intake  or output data in the 24 hours ending 03/01/14 2149  PHYSICAL EXAMINATION: General:  Elderly female alert on vent Neuro:  Awake, alert. Responds appropriately HEENT:  [place]/AT, ETT in place. PERRL Cardiovascular:  RRR, no MRG noted Lungs:  Diffuse crackles, synchronous with vent.  Abdomen:  Soft, non-tender, non-distended Musculoskeletal:  No acute deformity, no peripheral edema.  Skin:  Intact  LABS:  CBC  Recent Labs Lab 03/01/14 1930 03/01/14 1944  WBC 12.8*  --   HGB 13.3 15.0  HCT 39.2 44.0  PLT 245  --    Coag's No results found for this basename: APTT, INR,  in the last 168 hours BMET  Recent Labs Lab 03/01/14 1930 03/01/14 1944  NA 130* 129*  K 6.1* 5.9*  CL 93* 100  CO2 17*  --   BUN 25* 40*  CREATININE 0.91 1.00  GLUCOSE 339* 347*   Electrolytes  Recent Labs Lab 03/01/14 1930  CALCIUM 8.9   Sepsis Markers  Recent Labs Lab 03/01/14 1944  LATICACIDVEN 5.64*   ABG  Recent Labs Lab 03/01/14 2026  PHART 7.282*  PCO2ART 45.8*  PO2ART 95.0   Liver Enzymes  Recent Labs Lab 03/01/14 1930  AST 44*  ALT 17  ALKPHOS 43  BILITOT 0.6  ALBUMIN 4.1   Cardiac Enzymes  Recent Labs Lab 03/01/14 1930  PROBNP 2785.0*   Glucose No results found for this basename: GLUCAP,  in the last 168 hours  Imaging No results found.   ASSESSMENT / PLAN:  PULMONARY OETT 8/20 > A:  Acute respiratory failure Pulmonary edema  P:   Full vent support Diuresis as  tolerated F/u CXR/ABG Pulmonary hygeine PRN BD's SBT daily  CARDIOVASCULAR A:  HFrEF (LVEF 35-40% 2012) NSTEMI HTN  P:  Continue preadmission coreg, spironolactone, statin Trend troponin Strict I&O 2D echo Consult cardiology (Dr. Terrence Dupont) Heparin gtt  RENAL A:   Hyperkalemia  P:   Repeat Bmet IVF KVO Manage electrolytes as indicated.   GASTROINTESTINAL A:   GERD  P:   Pepcid NPO  HEMATOLOGIC A:   No acute issues  P:  VTE ppx - on full dose heparin Monitor  INFECTIOUS A:   Leukocytosis  P:   Monitor clinically  ENDOCRINE A:   No acute issues  P:   CBG monitoring while NPO Add SSI if glucose consistently greater than 180.  NEUROLOGIC A:   Acute encephalopathy 2nd to hypoxia, medical sedation  P:   RASS goal: -1 Fentanyl gtt for sedation Daily WUA  Georgann Housekeeper, ACNP [place] [facility]/Critical [facility] Pager [phone] or [phone]  Attending:  I have seen and examined the patient with nurse practitioner/resident and agree with the note above.     I have personally obtained a history, examined the patient, evaluated laboratory and imaging results, formulated the assessment and plan and placed orders. CRITICAL CARE: The patient is critically ill with multiple organ systems failure and requires high complexity decision making for assessment and support, frequent evaluation and titration of therapies, application of advanced monitoring technologies and extensive interpretation of multiple databases. Critical Care Time devoted to patient care services described in this note is 45 minutes.   Roselie Awkward, MD [place] P[facility] Pager: [phone] Cell: [phone] If no response, call [phone]

## 2014-03-01 NOTE — ED Notes (Signed)
Report to [place] RN on Pablo Pena.  Pt to have trop I drawn now, then to ICU.

## 2014-03-01 NOTE — ED Notes (Signed)
Eddie Dibbles PA with pulmonology in room with pt.

## 2014-03-02 ENCOUNTER — Encounter (HOSPITAL_COMMUNITY): Payer: Medicare Other

## 2014-03-02 ENCOUNTER — Ambulatory Visit (HOSPITAL_COMMUNITY): Payer: Medicare Other

## 2014-03-02 DIAGNOSIS — I5022 Chronic systolic (congestive) heart failure: Secondary | ICD-10-CM

## 2014-03-02 DIAGNOSIS — J449 Chronic obstructive pulmonary disease, unspecified: Secondary | ICD-10-CM

## 2014-03-02 LAB — URINALYSIS, ROUTINE W REFLEX MICROSCOPIC
BILIRUBIN URINE: NEGATIVE
Glucose, UA: NEGATIVE mg/dL
Hgb urine dipstick: NEGATIVE
Ketones, ur: NEGATIVE mg/dL
Nitrite: NEGATIVE
PROTEIN: NEGATIVE mg/dL
Specific Gravity, Urine: 1.011 (ref 1.005–1.030)
Urobilinogen, UA: 0.2 mg/dL (ref 0.0–1.0)
pH: 5 (ref 5.0–8.0)

## 2014-03-02 LAB — HEPARIN LEVEL (UNFRACTIONATED)
HEPARIN UNFRACTIONATED: 0.42 [IU]/mL (ref 0.30–0.70)
Heparin Unfractionated: 0.52 IU/mL (ref 0.30–0.70)

## 2014-03-02 LAB — BASIC METABOLIC PANEL
ANION GAP: 20 — AB (ref 5–15)
Anion gap: 17 — ABNORMAL HIGH (ref 5–15)
BUN: 28 mg/dL — ABNORMAL HIGH (ref 6–23)
BUN: 30 mg/dL — ABNORMAL HIGH (ref 6–23)
CALCIUM: 8.6 mg/dL (ref 8.4–10.5)
CHLORIDE: 96 meq/L (ref 96–112)
CHLORIDE: 96 meq/L (ref 96–112)
CO2: 23 meq/L (ref 19–32)
CO2: 20 meq/L (ref 19–32)
Calcium: 8.8 mg/dL (ref 8.4–10.5)
Creatinine, Ser: 0.97 mg/dL (ref 0.50–1.10)
Creatinine, Ser: 1.03 mg/dL (ref 0.50–1.10)
GFR calc Af Amer: 61 mL/min — ABNORMAL LOW (ref >90)
GFR calc Af Amer: 57 mL/min — ABNORMAL LOW (ref >90)
GFR calc non Af Amer: 53 mL/min — ABNORMAL LOW (ref >90)
GFR calc non Af Amer: 49 mL/min — ABNORMAL LOW (ref >90)
GLUCOSE: 97 mg/dL (ref 70–99)
GLUCOSE: 101 mg/dL — AB (ref 70–99)
Potassium: 4.5 mEq/L (ref 3.7–5.3)
Potassium: 4.2 mEq/L (ref 3.7–5.3)
SODIUM: 136 meq/L — AB (ref 137–147)
Sodium: 136 mEq/L — ABNORMAL LOW (ref 137–147)

## 2014-03-02 LAB — CBC
HCT: 34.8 % — ABNORMAL LOW (ref 36.0–46.0)
HEMOGLOBIN: 12.1 g/dL (ref 12.0–15.0)
MCH: 33.5 pg (ref 26.0–34.0)
MCHC: 34.8 g/dL (ref 30.0–36.0)
MCV: 96.4 fL (ref 78.0–100.0)
PLATELETS: 206 10*3/uL (ref 150–400)
RBC: 3.61 MIL/uL — AB (ref 3.87–5.11)
RDW: 12.6 % (ref 11.5–15.5)
WBC: 12.2 10*3/uL — AB (ref 4.0–10.5)

## 2014-03-02 LAB — URINE MICROSCOPIC-ADD ON

## 2014-03-02 LAB — BLOOD GAS, ARTERIAL
ACID-BASE DEFICIT: 0.9 mmol/L (ref 0.0–2.0)
Bicarbonate: 23.8 mEq/L (ref 20.0–24.0)
Drawn by: 331761
FIO2: 0.6 %
O2 Saturation: 99.6 %
PEEP: 5 cmH2O
PH ART: 7.363 (ref 7.350–7.450)
Patient temperature: 98.6
RATE: 14 resp/min
TCO2: 25.1 mmol/L (ref 0–100)
VT: 450 mL
pCO2 arterial: 42.9 mmHg (ref 35.0–45.0)
pO2, Arterial: 230 mmHg — ABNORMAL HIGH (ref 80.0–100.0)

## 2014-03-02 LAB — GLUCOSE, CAPILLARY
GLUCOSE-CAPILLARY: 82 mg/dL (ref 70–99)
GLUCOSE-CAPILLARY: 92 mg/dL (ref 70–99)
GLUCOSE-CAPILLARY: 105 mg/dL — AB (ref 70–99)
Glucose-Capillary: 125 mg/dL — ABNORMAL HIGH (ref 70–99)

## 2014-03-02 LAB — TROPONIN I
TROPONIN I: 0.65 ng/mL — AB (ref <0.30)
Troponin I: 0.72 ng/mL (ref <0.30)

## 2014-03-02 LAB — MRSA PCR SCREENING: MRSA by PCR: NEGATIVE

## 2014-03-02 MED ORDER — CHLORHEXIDINE GLUCONATE 0.12 % MT SOLN
15.0000 mL | Freq: Two times a day (BID) | OROMUCOSAL | Status: DC
  Administered 2014-03-02: 15 mL via OROMUCOSAL
  Filled 2014-03-02: qty 15

## 2014-03-02 MED ORDER — BLISTEX MEDICATED EX OINT
TOPICAL_OINTMENT | CUTANEOUS | Status: DC | PRN
  Administered 2014-03-02: 20:00:00 via TOPICAL
  Filled 2014-03-02: qty 10

## 2014-03-02 MED ORDER — CETYLPYRIDINIUM CHLORIDE 0.05 % MT LIQD
7.0000 mL | Freq: Four times a day (QID) | OROMUCOSAL | Status: DC
Start: 2014-03-02 — End: 2014-03-02
  Administered 2014-03-02: 7 mL via OROMUCOSAL

## 2014-03-02 MED ORDER — FUROSEMIDE 10 MG/ML IJ SOLN
40.0000 mg | Freq: Four times a day (QID) | INTRAMUSCULAR | Status: AC
  Administered 2014-03-02 (×3): 40 mg via INTRAVENOUS
  Filled 2014-03-02 (×3): qty 4

## 2014-03-02 MED ORDER — POTASSIUM CHLORIDE CRYS ER 20 MEQ PO TBCR
40.0000 meq | EXTENDED_RELEASE_TABLET | Freq: Once | ORAL | Status: AC
  Administered 2014-03-02: 40 meq via ORAL
  Filled 2014-03-02: qty 2

## 2014-03-02 NOTE — Progress Notes (Signed)
PULMONARY / CRITICAL CARE MEDICINE   Name: Haley Harris MRN: [id number] DOB: 11/18/1928    ADMISSION DATE:  03/01/2014 CONSULTATION DATE:  03/01/2014  REFERRING MD :  EDP  CHIEF COMPLAINT:  SOB  INITIAL PRESENTATION: 80 year old female with CHF presented 8/20 with respiratory distress. In ED she was found to be hypoxic and required intubation. CXR concerning for edema. PCCM asked to see for admission.   STUDIES:    SIGNIFICANT EVENTS: 8/20 > intubated, admitted to ICU  SUBJECTIVE: Much more alert and interactive this AM.  VITAL SIGNS: Temp:  [97 F (36.1 C)-100.4 F (38 C)] 100.4 F (38 C) (08/21 1100) Pulse Rate:  [67-123] 86 (08/21 1100) Resp:  [11-40] 17 (08/21 1100) BP: (64-168)/(41-134) 115/62 mmHg (08/21 1100) SpO2:  [84 %-100 %] 98 % (08/21 1100) FiO2 (%):  [30 %-80 %] 30 % (08/21 0900) Weight:  [165 lb (74.844 kg)-167 lb 5.3 oz (75.9 kg)] 166 lb 0.1 oz (75.3 kg) (08/21 0446)  HEMODYNAMICS:   VENTILATOR SETTINGS: Vent Mode:  [-] PSV FiO2 (%):  [30 %-80 %] 30 % Set Rate:  [14 bmp-16 bmp] 14 bmp Vt Set:  [450 mL] 450 mL PEEP:  [5 cmH20] 5 cmH20 Pressure Support:  [5 cmH20] 5 cmH20 Plateau Pressure:  [12 cmH20-19 cmH20] 12 cmH20  INTAKE / OUTPUT:  Intake/Output Summary (Last 24 hours) at 03/02/14 1130 Last data filed at 03/02/14 1100  Gross per 24 hour  Intake 457.44 ml  Output    447 ml  Net  10.44 ml   PHYSICAL EXAMINATION: General:  Elderly female alert on vent Neuro:  Awake, alert. Responds appropriately HEENT:  [place]/AT, ETT in place. PERRL Cardiovascular:  RRR, no MRG noted Lungs:  Diffuse crackles, synchronous with vent.  Abdomen:  Soft, non-tender, non-distended Musculoskeletal:  No acute deformity, no peripheral edema.  Skin:  Intact  LABS:  CBC  Recent Labs Lab 03/01/14 1930 03/01/14 1944 03/02/14 0550  WBC 12.8*  --  12.2*  HGB 13.3 15.0 12.1  HCT 39.2 44.0 34.8*  PLT 245  --  206   Coag's No results found for this basename:  APTT, INR,  in the last 168 hours BMET  Recent Labs Lab 03/01/14 1930 03/01/14 1944 03/02/14 0133 03/02/14 0550  NA 130* 129* 136* 136*  K 6.1* 5.9* 4.5 4.2  CL 93* 100 96 96  CO2 17*  --  23 20  BUN 25* 40* 28* 30*  CREATININE 0.91 1.00 0.97 1.03  GLUCOSE 339* 347* 97 101*   Electrolytes  Recent Labs Lab 03/01/14 1930 03/02/14 0133 03/02/14 0550  CALCIUM 8.9 8.6 8.8   Sepsis Markers  Recent Labs Lab 03/01/14 1944  LATICACIDVEN 5.64*   ABG  Recent Labs Lab 03/01/14 2026 03/02/14 0035  PHART 7.282* 7.363  PCO2ART 45.8* 42.9  PO2ART 95.0 230.0*   Liver Enzymes  Recent Labs Lab 03/01/14 1930  AST 44*  ALT 17  ALKPHOS 43  BILITOT 0.6  ALBUMIN 4.1   Cardiac Enzymes  Recent Labs Lab 03/01/14 1930 03/01/14 2245 03/02/14 0550 03/02/14 1010  TROPONINI  --  1.04* 0.72* 0.65*  PROBNP 2785.0*  --   --   --    Glucose  Recent Labs Lab 03/02/14 0246 03/02/14 0458 03/02/14 0742  GLUCAP 82 92 105*   Imaging Dg Chest Portable 1 View  03/01/2014   CLINICAL DATA:  Intubation.  EXAM: PORTABLE CHEST - 1 VIEW  COMPARISON:  07/21/2011  FINDINGS: The endotracheal tube is 2.9  cm above the carina. The NG tube is in the stomach. The heart is mildly enlarged but stable. There is tortuosity and calcification of the thoracic aorta. There is a diffuse large interstitial process in the lungs which is likely interstitial pulmonary edema. No large pleural effusion. The bony thorax is intact.  IMPRESSION: The endotracheal tube and NG tubes are in good position.  Diffuse interstitial process, likely edema.   Electronically Signed   By: Kalman Jewels M.D.   On: 03/01/2014 20:37   ASSESSMENT / PLAN:  PULMONARY OETT 8/20 >8/21 A:  Acute respiratory failure Pulmonary edema  P:   SBT to extubate today Diuresis as tolerated, see renal Pulmonary hygeine PRN BD's Early ambulation Titrate O2 for sats  CARDIOVASCULAR A:  HFrEF (LVEF 35-40% 2012) NSTEMI HTN  P:   Continue preadmission coreg, spironolactone, statin Trend troponin, dropping Strict I&O 2D echo per cards Consult cardiology (Dr. Terrence Dupont) Heparin gtt Diureses  RENAL A:   Hyperkalemia  P:   Daily BMET IVF KVO Manage electrolytes as indicated.  Lasix 40 mg IV 6 hours x3 doses  GASTROINTESTINAL A:   GERD  P:   Pepcid Heart healthy diet, no need for SLP given short term intubation (<12 hours).  HEMATOLOGIC A:   No acute issues  P:  VTE ppx - on full dose heparin, will defer to cards on that. Monitor  INFECTIOUS A:   Leukocytosis  P:   Monitor clinically  ENDOCRINE A:   No acute issues  P:   CBG monitoring while NPO Add SSI if glucose consistently greater than 180.  NEUROLOGIC A:   Acute encephalopathy 2nd to hypoxia, medical sedation  P:   D/C sedation Fentanyl PRN for pain.  I have personally obtained a history, examined the patient, evaluated laboratory and imaging results, formulated the assessment and plan and placed orders.  CRITICAL CARE: The patient is critically ill with multiple organ systems failure and requires high complexity decision making for assessment and support, frequent evaluation and titration of therapies, application of advanced monitoring technologies and extensive interpretation of multiple databases. Critical Care Time devoted to patient care services described in this note is 45 minutes.   Rush Farmer, M.D. [facility] Pulmonary/Critical Care Medicine. Pager: [phone]. After hours pager: [phone].

## 2014-03-02 NOTE — Progress Notes (Signed)
ANTICOAGULATION CONSULT NOTE - Follow Up Consult  Pharmacy Consult for Heparin Indication: chest pain/ACS  Allergies  Allergen Reactions  . Amoxicillin-Pot Clavulanate Anaphylaxis  . Amoxicillin     REACTION: Angioedema requiring intubation  . Cephalexin Other (See Comments)    Reaction unknown  . Clarithromycin Other (See Comments)    Reaction unknown  . Nifedipine Other (See Comments)    Reaction unknown  . Nsaids Other (See Comments)    Heart issue  . Olmesartan Medoxomil     REACTION: cough  . Tramadol Hcl Other (See Comments)    Reaction unknown    Patient Measurements: Height: 5\' 5"  (165.1 cm) Weight: 166 lb 0.1 oz (75.3 kg) IBW/kg (Calculated) : 57 Heparin Dosing Weight: 72kg  Vital Signs: Temp: 100.4 F (38 C) (08/21 0700) Temp src: Core (Comment) (08/21 0400) BP: 120/75 mmHg (08/21 0831) Pulse Rate: 87 (08/21 0700)  Labs:  Recent Labs  03/01/14 1930 03/01/14 1944 03/01/14 2245 03/02/14 0133 03/02/14 0550 03/02/14 0902  HGB 13.3 15.0  --   --  12.1  --   HCT 39.2 44.0  --   --  34.8*  --   PLT 245  --   --   --  206  --   HEPARINUNFRC  --   --   --   --   --  0.52  CREATININE 0.91 1.00  --  0.97 1.03  --   TROPONINI  --   --  1.04*  --  0.72*  --     Estimated Creatinine Clearance: 42.7 ml/min (by C-G formula based on Cr of 1.03).   Medications:  Heparin @ 750 units/hr  Assessment: 84yof with history of NSTEMI s/p stent to distal RCA in 2012 was started on heparin last night for elevated troponins. Initial heparin level is therapeutic. CBC stable. No bleeding reported. Noted plan for cath early next week.  Goal of Therapy:  Heparin level 0.3-0.7 units/ml Monitor platelets by anticoagulation protocol: Yes   Plan:  1) Continue heparin at 750 units/hr 2) Check 8 hour heparin level to confirm  Deboraha Sprang 03/02/2014,10:17 AM

## 2014-03-02 NOTE — Progress Notes (Signed)
Subjective:  Awake intubated being weaned off ventilator. Had low-grade fever last night. Cardiac enzymes are trending down. Left bundle branch block resolved  Objective:  Vital Signs in the last 24 hours: Temp:  [97 F (36.1 C)-100.4 F (38 C)] 100.4 F (38 C) (08/21 0700) Pulse Rate:  [67-123] 87 (08/21 0700) Resp:  [11-40] 12 (08/21 0700) BP: (64-168)/(41-134) 120/75 mmHg (08/21 0831) SpO2:  [84 %-100 %] 100 % (08/21 0700) FiO2 (%):  [30 %-80 %] 30 % (08/21 0831) Weight:  [74.844 kg (165 lb)-75.9 kg (167 lb 5.3 oz)] 75.3 kg (166 lb 0.1 oz) (08/21 0446)  Intake/Output from previous day: 08/20 0701 - 08/21 0700 In: 286.4 [I.V.:286.4] Out: 447 [Urine:297; Emesis/NG output:150] Intake/Output from this shift:    Physical Exam: Neck: no adenopathy, no carotid bruit, no JVD and supple, symmetrical, trachea midline Lungs: Decreased breath sound at bases with bibasilar rales and occasional rhonchi air entry has improved Heart: regular rate and rhythm, S1, S2 normal and Soft systolic murmur and S3 gallop noted Abdomen: soft, non-tender; bowel sounds normal; no masses,  no organomegaly Extremities: No clubbing cyanosis trace edema noted  Lab Results:  Recent Labs  03/01/14 1930 03/01/14 1944 03/02/14 0550  WBC 12.8*  --  12.2*  HGB 13.3 15.0 12.1  PLT 245  --  206    Recent Labs  03/02/14 0133 03/02/14 0550  NA 136* 136*  K 4.5 4.2  CL 96 96  CO2 23 20  GLUCOSE 97 101*  BUN 28* 30*  CREATININE 0.97 1.03    Recent Labs  03/01/14 2245 03/02/14 0550  TROPONINI 1.04* 0.72*   Hepatic Function Panel  Recent Labs  03/01/14 1930  PROT 7.8  ALBUMIN 4.1  AST 44*  ALT 17  ALKPHOS 43  BILITOT 0.6   No results found for this basename: CHOL,  in the last 72 hours No results found for this basename: PROTIME,  in the last 72 hours  Imaging: Imaging results have been reviewed and Dg Chest Portable 1 View  03/01/2014   CLINICAL DATA:  Intubation.  EXAM: PORTABLE  CHEST - 1 VIEW  COMPARISON:  07/21/2011  FINDINGS: The endotracheal tube is 2.9 cm above the carina. The NG tube is in the stomach. The heart is mildly enlarged but stable. There is tortuosity and calcification of the thoracic aorta. There is a diffuse large interstitial process in the lungs which is likely interstitial pulmonary edema. No large pleural effusion. The bony thorax is intact.  IMPRESSION: The endotracheal tube and NG tubes are in good position.  Diffuse interstitial process, likely edema.   Electronically Signed   By: Kalman Jewels M.D.   On: 03/01/2014 20:37    Cardiac Studies:  Assessment/Plan:  Status post acute non-Q-wave myocardial infarction Resolving acute pulmonary edema Acute respiratory failure secondary to above rule out aspiration Coronary artery disease history of non-Q-wave MI in the past status post PCI to distal RCA in November of 2000 well with similar presentation. Ischemic cardiomyopathy Status post possible foot causing Hypertension COPD History of cerebrovascular disease Degenerative joint disease Peripheral vascular disease Hypercholesteremia GERD History of CA of breast Plan Continue present management per CCM Check 2-D echo Check labs in a.m. Discussed with daughter regarding left cardiac cath possible PTCA stenting once she is extubated and stable from pulmonary point of view its risk and benefits and agrees for PCI early next week.    LOS: 1 day    Akshitha Culmer N 03/02/2014, 8:46 AM

## 2014-03-02 NOTE — Progress Notes (Signed)
ANTICOAGULATION CONSULT NOTE - Follow Up Consult  Pharmacy Consult for Heparin Indication: chest pain/ACS  Allergies  Allergen Reactions  . Amoxicillin-Pot Clavulanate Anaphylaxis  . Amoxicillin     REACTION: Angioedema requiring intubation  . Cephalexin Other (See Comments)    Reaction unknown  . Clarithromycin Other (See Comments)    Reaction unknown  . Nifedipine Other (See Comments)    Reaction unknown  . Nsaids Other (See Comments)    Heart issue  . Olmesartan Medoxomil     REACTION: cough  . Tramadol Hcl Other (See Comments)    Reaction unknown    Patient Measurements: Height: 5\' 5"  (165.1 cm) Weight: 166 lb 0.1 oz (75.3 kg) IBW/kg (Calculated) : 57 Heparin Dosing Weight: 72kg  Vital Signs: Temp: 98.3 F (36.8 C) (08/21 1600) Temp src: Oral (08/21 1600) BP: 92/54 mmHg (08/21 1800) Pulse Rate: 97 (08/21 1800)  Labs:  Recent Labs  03/01/14 1930 03/01/14 1944 03/01/14 2245 03/02/14 0133 03/02/14 0550 03/02/14 0902 03/02/14 1010 03/02/14 1831  HGB 13.3 15.0  --   --  12.1  --   --   --   HCT 39.2 44.0  --   --  34.8*  --   --   --   PLT 245  --   --   --  206  --   --   --   HEPARINUNFRC  --   --   --   --   --  0.52  --  0.42  CREATININE 0.91 1.00  --  0.97 1.03  --   --   --   TROPONINI  --   --  1.04*  --  0.72*  --  0.65*  --     Estimated Creatinine Clearance: 42.7 ml/min (by C-G formula based on Cr of 1.03).   Medications:  Heparin @ 750 units/hr  Assessment: 84yof with history of NSTEMI s/p stent to distal RCA in 2012 was started on heparin last night for elevated troponins. Heparin level 0.42 remains in goal range.  Goal of Therapy:  Heparin level 0.3-0.7 units/ml Monitor platelets by anticoagulation protocol: Yes   Plan:  1) Continue heparin at 750 units/hr 2) Daily HL and CBC  [place] S. Alford Highland, PharmD, BCPS Clinical Staff Pharmacist Pager [phone]  Eilene Ghazi Stillinger 03/02/2014,7:26 PM

## 2014-03-02 NOTE — Progress Notes (Signed)
  Echocardiogram 2D Echocardiogram has been performed.  Haley Harris 03/02/2014, 12:18 PM

## 2014-03-02 NOTE — Care Management Note (Addendum)
  Page 1 of 1   03/06/2014     11:09:32 AM CARE MANAGEMENT NOTE 03/06/2014  Patient:  Haley Harris, Haley Harris   Account Number:  [id number]  Date Initiated:  03/02/2014  Documentation initiated by:  Elissa Hefty  Subjective/Objective Assessment:   adm w resp arrest-vent     Action/Plan:   lives at home   Anticipated DC Date:  03/06/2014   Anticipated DC Plan:  [place] referral  Clinical Social Worker         Choice offered to / List presented to:             Status of service:  Completed, signed off Medicare Important Message given?  YES (If response is "NO", the following Medicare IM given date fields will be blank) Date Medicare IM given:  03/05/2014 Medicare IM given by:  Kimberly Coye Date Additional Medicare IM given:   Additional Medicare IM given by:    Discharge Disposition:  [place]  Per UR Regulation:  Reviewed for med. necessity/level of care/duration of stay  If discussed at [place] of Stay Meetings, dates discussed:   03/06/2014    Comments:  Mariann Laster RN, BSN, MSHL, CCM  Nurse - Case Manager,  (Unit [phone]  03/06/2014 Social:  From home with DTR;  DTR unable to provide LOC needs at this time.  Patient and DTR wish to continue with SNF at d/c.  DTR/[name] Covington present at bedside and request to speak with SW re:  SNF arrangements. Crawford Givens, LCSW assigned to [phone] notified.  Ziaire Hagos RN, BSN, MSHL, CCM  Nurse - Case Manager,  (Unit [phone]  03/05/2014 PT RECS:  SNF   SW Services initiated by [employer], [facility] Weekend Clinical Social Worker [phone] Disposition Plan:  SNF

## 2014-03-02 NOTE — Procedures (Signed)
Extubation Procedure Note  Patient Details:   Name: Haley Harris DOB: [birth year] MRN: [id number]   Airway Documentation:  Airway 7.5 mm (Active)  Secured at (cm) 24 cm 03/02/2014  8:31 AM  Measured From Lips 03/02/2014  8:31 AM  Secured Location Left 03/02/2014  8:31 AM  Secured By [employer] 03/02/2014  3:42 AM  Tube Holder Repositioned Yes 03/02/2014  8:31 AM  Site Condition Dry 03/02/2014  8:31 AM    Evaluation  O2 sats: stable throughout Complications: No apparent complications Patient did tolerate procedure well. Bilateral Breath Sounds: Rhonchi;Diminished Suctioning: Airway;Oral Yes IS 770ml Placed on 4l/min [place], vocalizes well, slightly raspy with good cough effort.  Revonda Standard 03/02/2014, 10:05 AM

## 2014-03-03 ENCOUNTER — Inpatient Hospital Stay (HOSPITAL_COMMUNITY): Payer: Medicare Other

## 2014-03-03 LAB — BASIC METABOLIC PANEL
Anion gap: 15 (ref 5–15)
BUN: 33 mg/dL — ABNORMAL HIGH (ref 6–23)
CHLORIDE: 96 meq/L (ref 96–112)
CO2: 28 meq/L (ref 19–32)
CREATININE: 1.16 mg/dL — AB (ref 0.50–1.10)
Calcium: 8.6 mg/dL (ref 8.4–10.5)
GFR calc non Af Amer: 43 mL/min — ABNORMAL LOW (ref >90)
GFR, EST AFRICAN AMERICAN: 49 mL/min — AB (ref >90)
Glucose, Bld: 123 mg/dL — ABNORMAL HIGH (ref 70–99)
POTASSIUM: 4.3 meq/L (ref 3.7–5.3)
SODIUM: 139 meq/L (ref 137–147)

## 2014-03-03 LAB — HEPARIN LEVEL (UNFRACTIONATED): HEPARIN UNFRACTIONATED: 0.46 [IU]/mL (ref 0.30–0.70)

## 2014-03-03 LAB — PHOSPHORUS: Phosphorus: 2.7 mg/dL (ref 2.3–4.6)

## 2014-03-03 LAB — CBC
HCT: 31.3 % — ABNORMAL LOW (ref 36.0–46.0)
Hemoglobin: 10.6 g/dL — ABNORMAL LOW (ref 12.0–15.0)
MCH: 33.5 pg (ref 26.0–34.0)
MCHC: 33.9 g/dL (ref 30.0–36.0)
MCV: 99.1 fL (ref 78.0–100.0)
PLATELETS: 166 10*3/uL (ref 150–400)
RBC: 3.16 MIL/uL — ABNORMAL LOW (ref 3.87–5.11)
RDW: 13 % (ref 11.5–15.5)
WBC: 8.7 10*3/uL (ref 4.0–10.5)

## 2014-03-03 LAB — MAGNESIUM: MAGNESIUM: 1.9 mg/dL (ref 1.5–2.5)

## 2014-03-03 LAB — TROPONIN I: TROPONIN I: 0.31 ng/mL — AB (ref <0.30)

## 2014-03-03 MED ORDER — FLUOXETINE HCL 20 MG PO CAPS
20.0000 mg | ORAL_CAPSULE | Freq: Every day | ORAL | Status: DC
  Administered 2014-03-03 – 2014-03-04 (×2): 20 mg via ORAL
  Filled 2014-03-03 (×4): qty 1

## 2014-03-03 MED ORDER — FLUTICASONE PROPIONATE 50 MCG/ACT NA SUSP
2.0000 | Freq: Every day | NASAL | Status: DC | PRN
  Filled 2014-03-03: qty 16

## 2014-03-03 MED ORDER — HEPARIN SODIUM (PORCINE) 5000 UNIT/ML IJ SOLN
5000.0000 [IU] | Freq: Three times a day (TID) | INTRAMUSCULAR | Status: DC
  Administered 2014-03-03 – 2014-03-04 (×5): 5000 [IU] via SUBCUTANEOUS
  Filled 2014-03-03 (×6): qty 1

## 2014-03-03 MED ORDER — DOXYCYCLINE HYCLATE 100 MG PO TABS
100.0000 mg | ORAL_TABLET | Freq: Two times a day (BID) | ORAL | Status: DC
  Administered 2014-03-03 – 2014-03-06 (×6): 100 mg via ORAL
  Filled 2014-03-03 (×8): qty 1

## 2014-03-03 MED ORDER — MONTELUKAST SODIUM 10 MG PO TABS
10.0000 mg | ORAL_TABLET | Freq: Every day | ORAL | Status: DC
  Administered 2014-03-03 – 2014-03-06 (×3): 10 mg via ORAL
  Filled 2014-03-03 (×4): qty 1

## 2014-03-03 NOTE — Progress Notes (Signed)
PULMONARY / CRITICAL CARE MEDICINE   Name: Haley Harris MRN: [id number] DOB: 28-May-1929    ADMISSION DATE:  03/01/2014 CONSULTATION DATE:  03/01/2014  REFERRING MD :  EDP  CHIEF COMPLAINT:  SOB  INITIAL PRESENTATION: 80 year old female with CHF presented 8/20 with respiratory distress. In ED she was found to be hypoxic and required intubation. CXR concerning for edema. PCCM asked to see for admission.   STUDIES:   SIGNIFICANT EVENTS: 8/20 > intubated, admitted to ICU  SUBJECTIVE:  Awake, walking to BR, comfortable Coughing up green phlegm  VITAL SIGNS: Temp:  [97.7 F (36.5 C)-100.8 F (38.2 C)] 97.7 F (36.5 C) (08/22 0749) Pulse Rate:  [70-97] 87 (08/22 0910) Resp:  [10-20] 14 (08/22 0910) BP: (90-143)/(50-87) 94/53 mmHg (08/22 0910) SpO2:  [94 %-100 %] 98 % (08/22 0910) Weight:  [72.8 kg (160 lb 7.9 oz)] 72.8 kg (160 lb 7.9 oz) (08/22 0622)  HEMODYNAMICS:   VENTILATOR SETTINGS:    INTAKE / OUTPUT:  Intake/Output Summary (Last 24 hours) at 03/03/14 6160 Last data filed at 03/03/14 0900  Gross per 24 hour  Intake  261.5 ml  Output   2100 ml  Net -1838.5 ml   PHYSICAL EXAMINATION: General:  Elderly female alert, up to chair Neuro:  Awake, alert. Responds appropriately HEENT:  [place]/AT, PERRL Cardiovascular:  RRR, no MRG noted Lungs:  Diffuse crackles no wheeze Abdomen:  Soft, non-tender, non-distended Musculoskeletal:  No acute deformity, no peripheral edema.  Skin:  Intact  LABS:  CBC  Recent Labs Lab 03/01/14 1930 03/01/14 1944 03/02/14 0550 03/03/14 0249  WBC 12.8*  --  12.2* 8.7  HGB 13.3 15.0 12.1 10.6*  HCT 39.2 44.0 34.8* 31.3*  PLT 245  --  206 166   Coag's No results found for this basename: APTT, INR,  in the last 168 hours BMET  Recent Labs Lab 03/02/14 0133 03/02/14 0550 03/03/14 0249  NA 136* 136* 139  K 4.5 4.2 4.3  CL 96 96 96  CO2 23 20 28   BUN 28* 30* 33*  CREATININE 0.97 1.03 1.16*  GLUCOSE 97 101* 123*    Electrolytes  Recent Labs Lab 03/02/14 0133 03/02/14 0550 03/03/14 0249  CALCIUM 8.6 8.8 8.6  MG  --   --  1.9  PHOS  --   --  2.7   Sepsis Markers  Recent Labs Lab 03/01/14 1944  LATICACIDVEN 5.64*   ABG  Recent Labs Lab 03/01/14 2026 03/02/14 0035  PHART 7.282* 7.363  PCO2ART 45.8* 42.9  PO2ART 95.0 230.0*   Liver Enzymes  Recent Labs Lab 03/01/14 1930  AST 44*  ALT 17  ALKPHOS 43  BILITOT 0.6  ALBUMIN 4.1   Cardiac Enzymes  Recent Labs Lab 03/01/14 1930  03/02/14 0550 03/02/14 1010 03/03/14 0249  TROPONINI  --   < > 0.72* 0.65* 0.31*  PROBNP 2785.0*  --   --   --   --   < > = values in this interval not displayed. Glucose  Recent Labs Lab 03/02/14 0246 03/02/14 0458 03/02/14 0742 03/02/14 1129  GLUCAP 82 92 105* 125*   Imaging No results found. ASSESSMENT / PLAN:  PULMONARY OETT 8/20 >8/21 A:  Acute respiratory failure, resolved Pulmonary edema, improved Possible bronchitis with productive cough  P:   recheck CXR 8/22 Diuresis as tolerated, see renal Pulmonary hygeine PRN BD's Early ambulation Titrate O2 for sats Start doxycycline 100 bid x 7 days for presumed bronchitis  CARDIOVASCULAR A:  HFrEF (LVEF  35-40% 2012) NSTEMI HTN Apical hypokinesis on TTE 8/21  P:  Continue preadmission coreg, spironolactone, statin; ? Add ACE-i, will defer this to Dr Terrence Dupont Planning for cath 8/24  Strict I&O Heparin gtt Diuresis  RENAL A:   Hyperkalemia P:   Daily BMET IVF KVO Manage electrolytes as indicated.  Lasix 40 mg IV 6 hours x3 doses, last on 8/22  GASTROINTESTINAL A:   GERD  P:   Pepcid Heart healthy diet, no need for SLP given short term intubation (<12 hours).  HEMATOLOGIC A:   No acute issues  P:  VTE ppx - on full dose heparin, will defer to cards on that. Monitor  INFECTIOUS A:   Leukocytosis Suspected bronchitis P:   Doxycycline x 7 days  Check CXR and Pct  ENDOCRINE A:   No acute  issues  P:   CBG monitoring    NEUROLOGIC A:   Acute encephalopathy 2nd to hypoxia, medical sedation > resolved  P:   Fentanyl PRN for pain.  I have personally obtained a history, examined the patient, evaluated laboratory and imaging results, formulated the assessment and plan and placed orders.  Will transfer to SDU and to [facility] as of 8/23  Baltazar Apo, MD, PhD 03/03/2014, 9:46 AM [place] [facility] [phone] or if no answer [phone]

## 2014-03-03 NOTE — Evaluation (Signed)
Physical Therapy Evaluation Patient Details Name: Haley Harris MRN: [id number] DOB: [birth year] Today's Date: 03/03/2014   History of Present Illness  Pt is an 80 y/o female presenting on 03/01/14 in respiratory distress. She developed vomiting and diarrhea over the 24 hours PTA after eating some leftovers. As a result of this she was unable to take her regular medications which included several medications for HF and diuretics. She became acutely SOB and EMS was called. In the ED she was found to be hypoxic and required intubation. CXR was obtained and concerning for edema. PMH includes CHF, HTN, CAD.   Clinical Impression  Pt admitted with the above. Pt currently with functional limitations due to the deficits listed below (see PT Problem List). At the time of PT eval pt was able to perform transfers and ambulation with min assist and +2 for safety/chair follow. Pt's O2 sats decreased as low as 72% on 3L/min supplemental O2 during gait training. O2 was increased to 4L/min and sats improved to >90% in ~3 minutes. Pt will benefit from skilled PT to increase their independence and safety with mobility to allow discharge to the venue listed below.       Follow Up Recommendations SNF;Supervision/Assistance - 24 hour    Equipment Recommendations  None recommended by PT    Recommendations for Other Services       Precautions / Restrictions Precautions Precautions: Fall Restrictions Weight Bearing Restrictions: No      Mobility  Bed Mobility               General bed mobility comments: Pt sitting in recliner upon PT arrival.   Transfers Overall transfer level: Needs assistance Equipment used: Rolling walker (2 wheeled) Transfers: Sit to/from Stand Sit to Stand: Min assist;+2 physical assistance;+2 safety/equipment         General transfer comment: VC's for hand placement on seated surface for safety. Pt somewhat impulsive with initiating transfers and required frequent cues to  wait for therapist and walker to be present prior to standing up. x2 attempts to achieve full standing.   Ambulation/Gait Ambulation/Gait assistance: Min guard;+2 safety/equipment Ambulation Distance (Feet): 40 Feet Assistive device: Rolling walker (2 wheeled) Gait Pattern/deviations: Step-through pattern;Decreased stride length;Trunk flexed Gait velocity: Decreased Gait velocity interpretation: Below normal speed for age/gender General Gait Details: VC's for sequencing and general safety awareness with the RW. Pt ambulated on 3L/min supplemental O2 and sats dropping into low 70's. Pt was seated and cued for pursed-lip breathing. Supplemental O2 increased to 4L/min and sats improved to mid-high 70's; pt was rolled back to the room in the recliner. Upon return to the room sats were in 90's and RN present to check on pt and replace pulse-ox sensor on finger.   Stairs            Wheelchair Mobility    Modified Rankin (Stroke Patients Only)       Balance Overall balance assessment: Needs assistance Sitting-balance support: Feet supported;No upper extremity supported Sitting balance-Leahy Scale: Good     Standing balance support: Bilateral upper extremity supported;During functional activity Standing balance-Leahy Scale: Fair Standing balance comment: Feel pt may be able to statically stand for short amounts of time without UE assist, however for O2 needs and dynamic standing, UE support should be provided.                              Pertinent Vitals/Pain Pain Assessment: No/denies pain  Home Living Family/patient expects to be discharged to:: Private residence Living Arrangements: Children Available Help at Discharge: Family;Available PRN/intermittently Type of Home: House Home Access: Stairs to enter   Entrance Stairs-Number of Steps: 3 Home Layout: One level Home Equipment: Cane - single Tax inspector - 4 wheels;Walker - 2 wheels       Prior Function Level of Independence: Independent         Comments: Gocery shopping and housework being done by daughter     Hand Dominance        Extremity/Trunk Assessment   Upper Extremity Assessment: Defer to OT evaluation           Lower Extremity Assessment: Generalized weakness      Cervical / Trunk Assessment: Kyphotic  Communication   Communication: HOH  Cognition Arousal/Alertness: Awake/alert Behavior During Therapy: WFL for tasks assessed/performed Overall Cognitive Status: Within Functional Limits for tasks assessed                      General Comments      Exercises        Assessment/Plan    PT Assessment Patient needs continued PT services  PT Diagnosis Difficulty walking;Generalized weakness   PT Problem List Decreased range of motion;Decreased strength;Decreased activity tolerance;Decreased balance;Decreased mobility;Decreased knowledge of use of DME;Decreased safety awareness;Decreased knowledge of precautions;Cardiopulmonary status limiting activity  PT Treatment Interventions DME instruction;Stair training;Gait training;Functional mobility training;Therapeutic activities;Therapeutic exercise;Neuromuscular re-education;Patient/family education   PT Goals (Current goals can be found in the Care Plan section) Acute Rehab PT Goals Patient Stated Goal: To return home safely.  PT Goal Formulation: With patient/family Time For Goal Achievement: 03/10/14 Potential to Achieve Goals: Good    Frequency Min 2X/week   Barriers to discharge Decreased caregiver support Pt will not have 24 hour support available to her at home.     Co-evaluation               End of Session Equipment Utilized During Treatment: Gait belt;Oxygen Activity Tolerance: Treatment limited secondary to medical complications (Comment) (Decreased O2 sats) Patient left: in chair;with call bell/phone within reach;with family/visitor present Nurse  Communication: Mobility status         Time: 1041-1107 PT Time Calculation (min): 26 min   Charges:   PT Evaluation $Initial PT Evaluation Tier I: 1 Procedure PT Treatments $Gait Training: 8-22 mins $Therapeutic Activity: 8-22 mins   PT G CodesJolyn Lent 03/03/2014, 11:23 AM  Jolyn Lent, PT, DPT Acute Rehabilitation Services Pager: [phone]

## 2014-03-03 NOTE — Progress Notes (Signed)
Subjective:  Patient denies any chest pain states breathing has improved. Complains of cough with yellowish mucus. Cardiac enzymes are trending down  Objective:  Vital Signs in the last 24 hours: Temp:  [97.7 F (36.5 C)-100.8 F (38.2 C)] 97.7 F (36.5 C) (08/22 0749) Pulse Rate:  [70-97] 87 (08/22 0910) Resp:  [10-20] 14 (08/22 0910) BP: (90-143)/(50-87) 94/53 mmHg (08/22 0910) SpO2:  [94 %-100 %] 98 % (08/22 0910) Weight:  [72.8 kg (160 lb 7.9 oz)] 72.8 kg (160 lb 7.9 oz) (08/22 0622)  Intake/Output from previous day: 08/21 0701 - 08/22 0700 In: 354.5 [P.O.:30; I.V.:284.5; NG/GT:40] Out: 2100 [Urine:2100] Intake/Output from this shift: Total I/O In: 15 [I.V.:15] Out: -   Physical Exam: Neck: no adenopathy, no carotid bruit, no JVD and supple, symmetrical, trachea midline Lungs: Decreased breath sound at bases with bilateral rhonchi and faint rales noted Heart: regular rate and rhythm, S1, S2 normal and Soft systolic murmur and S3 gallop noted Abdomen: soft, non-tender; bowel sounds normal; no masses,  no organomegaly Extremities: extremities normal, atraumatic, no cyanosis or edema  Lab Results:  Recent Labs  03/02/14 0550 03/03/14 0249  WBC 12.2* 8.7  HGB 12.1 10.6*  PLT 206 166    Recent Labs  03/02/14 0550 03/03/14 0249  NA 136* 139  K 4.2 4.3  CL 96 96  CO2 20 28  GLUCOSE 101* 123*  BUN 30* 33*  CREATININE 1.03 1.16*    Recent Labs  03/02/14 1010 03/03/14 0249  TROPONINI 0.65* 0.31*   Hepatic Function Panel  Recent Labs  03/01/14 1930  PROT 7.8  ALBUMIN 4.1  AST 44*  ALT 17  ALKPHOS 43  BILITOT 0.6   No results found for this basename: CHOL,  in the last 72 hours No results found for this basename: PROTIME,  in the last 72 hours  Imaging: Imaging results have been reviewed and Dg Chest Portable 1 View  03/01/2014   CLINICAL DATA:  Intubation.  EXAM: PORTABLE CHEST - 1 VIEW  COMPARISON:  07/21/2011  FINDINGS: The endotracheal tube is  2.9 cm above the carina. The NG tube is in the stomach. The heart is mildly enlarged but stable. There is tortuosity and calcification of the thoracic aorta. There is a diffuse large interstitial process in the lungs which is likely interstitial pulmonary edema. No large pleural effusion. The bony thorax is intact.  IMPRESSION: The endotracheal tube and NG tubes are in good position.  Diffuse interstitial process, likely edema.   Electronically Signed   By: Kalman Jewels M.D.   On: 03/01/2014 20:37    Cardiac Studies:  Assessment/Plan:  Status post acute non-Q-wave myocardial infarction  Resolving acute pulmonary edema  Acute bronchitis Acute respiratory failure secondary to above rule out aspiration  Coronary artery disease history of non-Q-wave MI in the past status post PCI to distal RCA in November of 2000 well with similar presentation.  Ischemic cardiomyopathy  Status post possible foot causing  Hypertension  COPD  History of cerebrovascular disease  Degenerative joint disease  Peripheral vascular disease  Hypercholesteremia  GERD  History of CA of breast Acute anemia rule out GI loss Plan DC heparin Start Plavix 75 mg daily Discuss with patient regarding left cath possible PTCA stenting its risk and benefits i.e. death MI stroke need for emergency CABG local vascular complications etc. and consents for PCI. We'll schedule her for Monday. Check stools for occult blood Check labs and x-ray in a.m.  LOS: 2 days  Evolette Pendell N 03/03/2014, 11:00 AM

## 2014-03-03 NOTE — Progress Notes (Signed)
ANTICOAGULATION CONSULT NOTE - Follow Up Consult  Pharmacy Consult for Heparin Indication: chest pain/ACS  Allergies  Allergen Reactions  . Amoxicillin-Pot Clavulanate Anaphylaxis  . Amoxicillin     REACTION: Angioedema requiring intubation  . Cephalexin Other (See Comments)    Reaction unknown  . Clarithromycin Other (See Comments)    Reaction unknown  . Nifedipine Other (See Comments)    Reaction unknown  . Nsaids Other (See Comments)    Heart issue  . Olmesartan Medoxomil     REACTION: cough  . Tramadol Hcl Other (See Comments)    Reaction unknown    Patient Measurements: Height: 5\' 5"  (165.1 cm) Weight: 160 lb 7.9 oz (72.8 kg) IBW/kg (Calculated) : 57 Heparin Dosing Weight: 72kg  Vital Signs: Temp: 97.7 F (36.5 C) (08/22 0749) Temp src: Oral (08/22 0749) BP: 94/53 mmHg (08/22 0910) Pulse Rate: 87 (08/22 0910)  Labs:  Recent Labs  03/01/14 1930 03/01/14 1944  03/02/14 0133 03/02/14 0550 03/02/14 0902 03/02/14 1010 03/02/14 1831 03/03/14 0249 03/03/14 0848  HGB 13.3 15.0  --   --  12.1  --   --   --  10.6*  --   HCT 39.2 44.0  --   --  34.8*  --   --   --  31.3*  --   PLT 245  --   --   --  206  --   --   --  166  --   HEPARINUNFRC  --   --   --   --   --  0.52  --  0.42  --  0.46  CREATININE 0.91 1.00  --  0.97 1.03  --   --   --  1.16*  --   TROPONINI  --   --   < >  --  0.72*  --  0.65*  --  0.31*  --   < > = values in this interval not displayed.  Estimated Creatinine Clearance: 37.4 ml/min (by C-G formula based on Cr of 1.16).   Medications:  Heparin @ 750 units/hr  Assessment: 84yof with history of NSTEMI s/p stent to distal RCA in 2012 continues on heparin with plans for cath early next week. Heparin level is therapeutic. Hgb decreased 12.1-->10.6 will watch closely. No bleeding reported.   Goal of Therapy:  Heparin level 0.3-0.7 units/ml Monitor platelets by anticoagulation protocol: Yes   Plan:  1) Continue heparin at 750  units/hr 2) Follow up heparin level and CBC in AM  Deboraha Sprang 03/03/2014,9:31 AM

## 2014-03-04 LAB — BASIC METABOLIC PANEL
ANION GAP: 13 (ref 5–15)
BUN: 44 mg/dL — ABNORMAL HIGH (ref 6–23)
CO2: 26 meq/L (ref 19–32)
Calcium: 8.1 mg/dL — ABNORMAL LOW (ref 8.4–10.5)
Chloride: 93 mEq/L — ABNORMAL LOW (ref 96–112)
Creatinine, Ser: 1.14 mg/dL — ABNORMAL HIGH (ref 0.50–1.10)
GFR calc Af Amer: 50 mL/min — ABNORMAL LOW (ref >90)
GFR calc non Af Amer: 44 mL/min — ABNORMAL LOW (ref >90)
Glucose, Bld: 126 mg/dL — ABNORMAL HIGH (ref 70–99)
POTASSIUM: 3.8 meq/L (ref 3.7–5.3)
SODIUM: 132 meq/L — AB (ref 137–147)

## 2014-03-04 LAB — CBC
HCT: 31.6 % — ABNORMAL LOW (ref 36.0–46.0)
Hemoglobin: 10.8 g/dL — ABNORMAL LOW (ref 12.0–15.0)
MCH: 33.5 pg (ref 26.0–34.0)
MCHC: 34.2 g/dL (ref 30.0–36.0)
MCV: 98.1 fL (ref 78.0–100.0)
Platelets: 157 10*3/uL (ref 150–400)
RBC: 3.22 MIL/uL — AB (ref 3.87–5.11)
RDW: 12.7 % (ref 11.5–15.5)
WBC: 6.4 10*3/uL (ref 4.0–10.5)

## 2014-03-04 LAB — PROCALCITONIN

## 2014-03-04 LAB — PHOSPHORUS: PHOSPHORUS: 3.2 mg/dL (ref 2.3–4.6)

## 2014-03-04 LAB — MAGNESIUM: MAGNESIUM: 1.8 mg/dL (ref 1.5–2.5)

## 2014-03-04 NOTE — Progress Notes (Signed)
[place] TEAM 1 - Stepdown/ICU TEAM Progress Note  Haley Harris WJX:[id number] DOB: 1929-05-21 DOA: 03/01/2014 PCP: [name], MD  Admit HPI / Brief Narrative: 80 year old female with hx of CHF, HTN, and CAD presented to [facility] ED 8/20. She developed vomiting and diarrhea after eating some leftovers. As a result of this she was unable to take her regular medications which included several medications for HF and diuretics. She became acutely SOB and EMS was called. Upon their arrival she was in respiratory distress, and complaining of chest discomfort. She was placed on NIMV. At time of presentation to ED she was hypoxic and CXR was consistent with edema. She was intubated in ED.   Significant events: 8/20 - intubated - admit to ICU 8/21 - extubated 8/21 - TTE -  EF 40-45% - severe hypokinesis apex - grade 1 DD  HPI/Subjective: Resting comfortably.  Denies c/o, but does admit to [place].  No current cp, n/v, or abdom pain.    Assessment/Plan:  Acute hypoxic respiratory failure due to Acute Pulmonary edema resolved   Possible bronchitis with productive cough doxycycline 100 bid x 7 days   Acute exacerbation systolic CHF / ischemic cardiomyopathy LVEF 35-40% 2012 - appears euvolemic on exam today   NSTEMI - acute non Q-wave MI - hx of CAD s/p PCI distal RCA Nov 2000 Cardiology following - for cardiac cath in AM   HTN BP currently well controlled  Hyperkalemia  Resolved   GERD  Code Status: FULL Family Communication: no family present at time of exam   Disposition Plan: SDU  Consultants: Cardiology - Harwani   Antibiotics: Doxycycline 8/22 >>  DVT prophylaxis: SQ heparin   Objective: Blood pressure 125/71, pulse 79, temperature 98.4 F (36.9 C), temperature source Oral, resp. rate 15, height 5\' 5"  (1.651 m), weight 77.6 kg (171 lb 1.2 oz), SpO2 100.00%.  Intake/Output Summary (Last 24 hours) at 03/04/14 1139 Last data filed at 03/04/14 0600  Gross per 24 hour    Intake  847.5 ml  Output      0 ml  Net  847.5 ml   Exam: General: No acute respiratory distress Lungs: Clear to auscultation bilaterally without wheezes or crackles Cardiovascular: Regular rate and rhythm without murmur gallop or rub normal S1 and S2 Abdomen: Nontender, nondistended, soft, bowel sounds positive, no rebound, no ascites, no appreciable mass Extremities: No significant cyanosis, clubbing, or edema bilateral lower extremities  Data Reviewed: Basic Metabolic Panel:  Recent Labs Lab 03/01/14 1930 03/01/14 1944 03/02/14 0133 03/02/14 0550 03/03/14 0249 03/04/14 0306  NA 130* 129* 136* 136* 139 132*  K 6.1* 5.9* 4.5 4.2 4.3 3.8  CL 93* 100 96 96 96 93*  CO2 17*  --  23 20 28 26   GLUCOSE 339* 347* 97 101* 123* 126*  BUN 25* 40* 28* 30* 33* 44*  CREATININE 0.91 1.00 0.97 1.03 1.16* 1.14*  CALCIUM 8.9  --  8.6 8.8 8.6 8.1*  MG  --   --   --   --  1.9 1.8  PHOS  --   --   --   --  2.7 3.2    Liver Function Tests:  Recent Labs Lab 03/01/14 1930  AST 44*  ALT 17  ALKPHOS 43  BILITOT 0.6  PROT 7.8  ALBUMIN 4.1   Coags: No results found for this basename: PT, INR,  in the last 168 hours No results found for this basename: PTT,  in the last 168 hours  CBC:  Recent Labs Lab 03/01/14 1930 03/01/14 1944 03/02/14 0550 03/03/14 0249 03/04/14 0306  WBC 12.8*  --  12.2* 8.7 6.4  NEUTROABS 10.8*  --   --   --   --   HGB 13.3 15.0 12.1 10.6* 10.8*  HCT 39.2 44.0 34.8* 31.3* 31.6*  MCV 97.8  --  96.4 99.1 98.1  PLT 245  --  206 166 157   Cardiac Enzymes:  Recent Labs Lab 03/01/14 2245 03/02/14 0550 03/02/14 1010 03/03/14 0249  TROPONINI 1.04* 0.72* 0.65* 0.31*   CBG:  Recent Labs Lab 03/02/14 0246 03/02/14 0458 03/02/14 0742 03/02/14 1129  GLUCAP 82 92 105* 125*    Recent Results (from the past 240 hour(s))  MRSA PCR SCREENING     Status: None   Collection Time    03/01/14 11:47 PM      Result Value Ref Range Status   MRSA by PCR  NEGATIVE  NEGATIVE Final   Comment:            The GeneXpert MRSA Assay (FDA     approved for NASAL specimens     only), is one component of a     comprehensive MRSA colonization     surveillance program. It is not     intended to diagnose MRSA     infection nor to guide or     monitor treatment for     MRSA infections.  CULTURE, BLOOD (ROUTINE X 2)     Status: None   Collection Time    03/02/14  3:45 PM      Result Value Ref Range Status   Specimen Description BLOOD LEFT HAND   Final   Special Requests BOTTLES DRAWN AEROBIC ONLY 4CC   Final   Culture  Setup Time     Final   Value: 03/02/2014 22:38     Performed at [facility]   Culture     Final   Value:        BLOOD CULTURE RECEIVED NO GROWTH TO DATE CULTURE WILL BE HELD FOR 5 DAYS BEFORE ISSUING A FINAL NEGATIVE REPORT     Performed at [facility]   Report Status PENDING   Incomplete  CULTURE, BLOOD (ROUTINE X 2)     Status: None   Collection Time    03/02/14  4:00 PM      Result Value Ref Range Status   Specimen Description BLOOD LEFT FOREARM   Final   Special Requests BOTTLES DRAWN AEROBIC AND ANAEROBIC 10CC   Final   Culture  Setup Time     Final   Value: 03/02/2014 22:39     Performed at [facility]   Culture     Final   Value:        BLOOD CULTURE RECEIVED NO GROWTH TO DATE CULTURE WILL BE HELD FOR 5 DAYS BEFORE ISSUING A FINAL NEGATIVE REPORT     Performed at [facility]   Report Status PENDING   Incomplete     Studies:  Recent x-ray studies have been reviewed in detail by the Attending Physician  Scheduled Meds:  Scheduled Meds: . aspirin EC  81 mg Oral Daily  . atorvastatin  80 mg Oral q1800  . carvedilol  6.25 mg Oral BID WC  . doxycycline  100 mg Oral Q12H  . FLUoxetine  20 mg Oral Daily  . furosemide  40 mg Intravenous Daily  . heparin subcutaneous  5,000 Units Subcutaneous 3  times per day  . montelukast  10 mg Oral Daily  . spironolactone  25 mg Per Tube Daily     Time spent on care of this patient: 35 mins   Arther Heisler T , MD   [facility] Office  [phone] Pager - Text Page per Shea Evans as per below:  On-Call/Text Page:      [email or web address]      password [email or web address]  If 7PM-7AM, please contact night-coverage [email or web address] Password [facility] 03/04/2014, 11:39 AM   LOS: 3 days

## 2014-03-04 NOTE — Clinical Social Work Psychosocial (Signed)
Clinical Social Work Department BRIEF PSYCHOSOCIAL ASSESSMENT 03/04/2014  Patient:  Haley Harris, Haley Harris     Account Number:  [id number]     Admit date:  03/01/2014  Clinical Social Worker:  Hubert Azure  Date/Time:  03/04/2014 05:04 PM  Referred by:  Physician  Date Referred:  03/04/2014 Referred for  SNF Placement   Other Referral:   Interview type:  Family Other interview type:   CSW met with patient's daughter Haley Harris and patient.    PSYCHOSOCIAL DATA Living Status:  WITH ADULT CHILDREN Admitted from facility:   Level of care:   Primary support name:  Sandra/Donna (daughters) Primary support relationship to patient:  CHILD, ADULT Degree of support available:   Good    CURRENT CONCERNS Current Concerns  Post-Acute Placement   Other Concerns:    SOCIAL WORK ASSESSMENT / PLAN CSW met with patient's daughter as patient was currently in bathroom. CSW introduced self and explained role. CSW explained SNF placement process and discussed d/c plan with patient's daughter. Per Haley Harris, patient resides with other daughter Haley Harris) and her husband. Haley Harris stated patient has a walker, but didn't have to use it until recently. Haley Harris stated preference is for [facility].    CSW further met with patient who presented in room. Per patient, she prefers [facility], but understands years ago insurance did not contract with facility. Patient stated preference is for [facility]. Per patient, daughter and son in law currently reside with her. Patient stated son in law has medical complexities and daughter has a 83 month old grandchild she is currently taking care of. Patient stated at this time she is agreeable to SNF placement as she does not feel daughter will be able to manage her at home.   Assessment/plan status:  Other - See comment Other assessment/ plan:   CSW will update Fl2 for SNF placement.   Information/referral to community resources:    PATIENT'S/FAMILY'S RESPONSE TO PLAN  OF CARE: Patient and daughter Haley Harris) were pleasant and cooperative. Patient thanked CSW for assistance with d/c plan.   [place], [place] Weekend Clinical Social Worker [phone]

## 2014-03-04 NOTE — Progress Notes (Signed)
Subjective:  Patient denies any chest pain states coughing and breathing is improved. Denies any fever or chills. States noticed occasional streaks of blood and sputum no overt hemoptysis patient was started on Plavix yesterday. Patient off heparin  Objective:  Vital Signs in the last 24 hours: Temp:  [97.6 F (36.4 C)-98.4 F (36.9 C)] 98.4 F (36.9 C) (08/23 0812) Pulse Rate:  [70-120] 79 (08/23 0812) Resp:  [12-23] 15 (08/23 0812) BP: (112-134)/(53-71) 125/71 mmHg (08/23 0812) SpO2:  [96 %-100 %] 100 % (08/23 0812) Weight:  [74.481 kg (164 lb 3.2 oz)-77.6 kg (171 lb 1.2 oz)] 77.6 kg (171 lb 1.2 oz) (08/23 0442)  Intake/Output from previous day: 08/22 0701 - 08/23 0700 In: 1117.5 [P.O.:1080; I.V.:37.5] Out: -  Intake/Output from this shift:    Physical Exam: Neck: no adenopathy, no carotid bruit, no JVD and supple, symmetrical, trachea midline Lungs: Decrease breath sounds at bases air entry improved Heart: regular rate and rhythm, S1, S2 normal and Soft systolic murmur and S3 gallop noted Abdomen: soft, non-tender; bowel sounds normal; no masses,  no organomegaly Extremities: extremities normal, atraumatic, no cyanosis or edema  Lab Results:  Recent Labs  03/03/14 0249 03/04/14 0306  WBC 8.7 6.4  HGB 10.6* 10.8*  PLT 166 157    Recent Labs  03/03/14 0249 03/04/14 0306  NA 139 132*  K 4.3 3.8  CL 96 93*  CO2 28 26  GLUCOSE 123* 126*  BUN 33* 44*  CREATININE 1.16* 1.14*    Recent Labs  03/02/14 1010 03/03/14 0249  TROPONINI 0.65* 0.31*   Hepatic Function Panel  Recent Labs  03/01/14 1930  PROT 7.8  ALBUMIN 4.1  AST 44*  ALT 17  ALKPHOS 43  BILITOT 0.6   No results found for this basename: CHOL,  in the last 72 hours No results found for this basename: PROTIME,  in the last 72 hours  Imaging: Imaging results have been reviewed and Dg Chest 2 View  03/03/2014   CLINICAL DATA:  Productive cough ; COPD and coronaries stents.  EXAM: CHEST  2  VIEW  COMPARISON:  Portable chest x-ray of March 01, 2014  FINDINGS: The lungs are well-expanded. The interstitial markings have markedly improved since the previous study. The endotracheal tube is been removed as has the esophagogastric tube. The cardiac silhouette remains enlarged. The pulmonary vascularity is not engorged. There is a tiny amount of pleural fluid blunting the posterior costophrenic angles.  IMPRESSION: There has been marked improvement in the appearance of the chest since the previous study consistent with resolution of CHF. A trace of pleural fluid at the lung bases persists.   Electronically Signed   By: David  Martinique   On: 03/03/2014 18:37    Cardiac Studies:  Assessment/Plan:  Status post acute non-Q-wave myocardial infarction  Resolving acute pulmonary edema  Acute bronchitis  Acute respiratory failure secondary to above rule out aspiration  Coronary artery disease history of non-Q-wave MI in the past status post PCI to distal RCA in November of 2000 well with similar presentation.  Ischemic cardiomyopathy  Status post possible foot causing  Hypertension  COPD  History of cerebrovascular disease  Degenerative joint disease  Peripheral vascular disease  Hypercholesteremia  GERD  History of CA of breast  Acute anemia rule out GI loss  Plan Continue present management Discussed with patient and her daughter at length regarding various options of treatment i.e. medical versus left cath possible PTCA stenting its risk and benefits i.e. death  MI stroke need for emergency CABG local vascular complications risk of restenosis etc. and consented for PCI  LOS: 3 days    Lavere Shinsky N 03/04/2014, 11:03 AM

## 2014-03-04 NOTE — Clinical Social Work Placement (Addendum)
Clinical Social Work Department CLINICAL SOCIAL WORK PLACEMENT NOTE 03/04/2014  Patient:  Haley Harris, Haley Harris  Account Number:  [id number] Admit date:  03/01/2014  Clinical Social Worker:  Carrington Clamp, [place]  Date/time:  03/04/2014 05:11 PM  Clinical Social Work is seeking post-discharge placement for this patient at the following level of care:   SKILLED NURSING   (*CSW will update this form in Epic as items are completed)   03/04/2014  Patient/family provided with [place] Department of Clinical Social Work's list of facilities offering this level of care within the geographic area requested by the patient (or if unable, by the patient's family).  03/04/2014  Patient/family informed of their freedom to choose among providers that offer the needed level of care, that participate in Medicare, Medicaid or managed care program needed by the patient, have an available bed and are willing to accept the patient.  03/04/2014  Patient/family informed of [facility]' ownership interest in [facility], as well as of the fact that they are under no obligation to receive care at this facility.  PASARR submitted to EDS on Existing PASARR number received on Existing   FL2 transmitted to all facilities in geographic area requested by pt/family on  03/04/2014 FL2 transmitted to all facilities within larger geographic area on   Patient informed that his/her managed care company has contracts with or will negotiate with  certain facilities, including the following:     Patient/family informed of bed offers received: 03/06/14  Patient chooses bed at [facility] Physician recommends and patient chooses bed at    Patient to be transferred to [facility] on 03/06/14  Patient to be transferred to facility by ambulance Patient and family notified of transfer on 03/06/14 Name of family member notified: Daughter Haley Harris at the bedside    The following physician request were  entered in Epic:  Additional Comments:  Chiropodist, [place] Weekend Clinical Social Worker [phone]

## 2014-03-05 ENCOUNTER — Encounter (HOSPITAL_COMMUNITY)
Admission: EM | Disposition: A | Discharge status: Skilled Nursing Facility | Payer: Medicare Other | Source: Home / Self Care | Attending: Pulmonary Disease

## 2014-03-05 HISTORY — PX: LEFT HEART CATHETERIZATION WITH CORONARY ANGIOGRAM: SHX5451

## 2014-03-05 LAB — BASIC METABOLIC PANEL WITH GFR
Anion gap: 12 (ref 5–15)
BUN: 38 mg/dL — ABNORMAL HIGH (ref 6–23)
CO2: 25 meq/L (ref 19–32)
Calcium: 8 mg/dL — ABNORMAL LOW (ref 8.4–10.5)
Chloride: 95 meq/L — ABNORMAL LOW (ref 96–112)
Creatinine, Ser: 0.99 mg/dL (ref 0.50–1.10)
GFR calc Af Amer: 60 mL/min — ABNORMAL LOW
GFR calc non Af Amer: 52 mL/min — ABNORMAL LOW
Glucose, Bld: 119 mg/dL — ABNORMAL HIGH (ref 70–99)
Potassium: 5.4 meq/L — ABNORMAL HIGH (ref 3.7–5.3)
Sodium: 132 meq/L — ABNORMAL LOW (ref 137–147)

## 2014-03-05 LAB — CBC
HCT: 31.3 % — ABNORMAL LOW (ref 36.0–46.0)
Hemoglobin: 10.6 g/dL — ABNORMAL LOW (ref 12.0–15.0)
MCH: 34.1 pg — AB (ref 26.0–34.0)
MCHC: 33.9 g/dL (ref 30.0–36.0)
MCV: 100.6 fL — ABNORMAL HIGH (ref 78.0–100.0)
Platelets: 196 10*3/uL (ref 150–400)
RBC: 3.11 MIL/uL — ABNORMAL LOW (ref 3.87–5.11)
RDW: 12.8 % (ref 11.5–15.5)
WBC: 8.1 10*3/uL (ref 4.0–10.5)

## 2014-03-05 LAB — PROCALCITONIN

## 2014-03-05 LAB — POCT ACTIVATED CLOTTING TIME: Activated Clotting Time: 355 seconds

## 2014-03-05 LAB — PROTIME-INR
INR: 0.92 (ref 0.00–1.49)
Prothrombin Time: 12.4 s (ref 11.6–15.2)

## 2014-03-05 SURGERY — LEFT HEART CATHETERIZATION WITH CORONARY ANGIOGRAM
Anesthesia: LOCAL

## 2014-03-05 MED ORDER — ONDANSETRON HCL 4 MG/2ML IJ SOLN: 4.0000 mg | Freq: Four times a day (QID) | INTRAMUSCULAR | Status: DC | PRN

## 2014-03-05 MED ORDER — FAMOTIDINE IN NACL 20-0.9 MG/50ML-% IV SOLN
INTRAVENOUS | Status: AC
  Filled 2014-03-05: qty 50

## 2014-03-05 MED ORDER — ASPIRIN 81 MG PO CHEW: 81.0000 mg | CHEWABLE_TABLET | ORAL | Status: DC

## 2014-03-05 MED ORDER — SODIUM CHLORIDE 0.9 % IV SOLN: 1.0000 mL/kg/h | INTRAVENOUS | Status: DC

## 2014-03-05 MED ORDER — ASPIRIN 81 MG PO CHEW
81.0000 mg | CHEWABLE_TABLET | ORAL | Status: AC
  Administered 2014-03-05: 81 mg via ORAL
  Filled 2014-03-05: qty 1

## 2014-03-05 MED ORDER — MIDAZOLAM HCL 2 MG/2ML IJ SOLN
INTRAMUSCULAR | Status: AC
  Filled 2014-03-05: qty 2

## 2014-03-05 MED ORDER — CLOPIDOGREL BISULFATE 300 MG PO TABS
300.0000 mg | ORAL_TABLET | ORAL | Status: AC
  Administered 2014-03-05: 300 mg via ORAL
  Filled 2014-03-05: qty 1

## 2014-03-05 MED ORDER — ATORVASTATIN CALCIUM 80 MG PO TABS: 80.0000 mg | ORAL_TABLET | Freq: Every day | ORAL | Status: DC

## 2014-03-05 MED ORDER — ACETAMINOPHEN 325 MG PO TABS
650.0000 mg | ORAL_TABLET | ORAL | Status: DC | PRN
  Administered 2014-03-05: 650 mg via ORAL
  Filled 2014-03-05: qty 2

## 2014-03-05 MED ORDER — NITROGLYCERIN IN D5W 200-5 MCG/ML-% IV SOLN
5.0000 ug/min | INTRAVENOUS | Status: DC
  Administered 2014-03-05: 5 ug/min via INTRAVENOUS
  Filled 2014-03-05: qty 250

## 2014-03-05 MED ORDER — LOSARTAN POTASSIUM 50 MG PO TABS
50.0000 mg | ORAL_TABLET | Freq: Every day | ORAL | Status: DC
  Administered 2014-03-05 – 2014-03-06 (×2): 50 mg via ORAL
  Filled 2014-03-05 (×2): qty 1

## 2014-03-05 MED ORDER — SODIUM CHLORIDE 0.9 % IV SOLN
1.0000 mL/kg/h | INTRAVENOUS | Status: DC
Start: 2014-03-05 — End: 2014-03-05
  Administered 2014-03-05: 1 mL/kg/h via INTRAVENOUS

## 2014-03-05 MED ORDER — SODIUM CHLORIDE 0.9 % IV SOLN
250.0000 mL | INTRAVENOUS | Status: DC | PRN
Start: 2014-03-05 — End: 2014-03-05

## 2014-03-05 MED ORDER — CLOPIDOGREL BISULFATE 300 MG PO TABS
ORAL_TABLET | ORAL | Status: AC
  Filled 2014-03-05: qty 1

## 2014-03-05 MED ORDER — BIVALIRUDIN 250 MG IV SOLR
INTRAVENOUS | Status: AC
  Filled 2014-03-05: qty 250

## 2014-03-05 MED ORDER — SODIUM CHLORIDE 0.9 % IJ SOLN: 3.0000 mL | Freq: Two times a day (BID) | INTRAMUSCULAR | Status: DC

## 2014-03-05 MED ORDER — SODIUM CHLORIDE 0.9 % IJ SOLN: 3.0000 mL | INTRAMUSCULAR | Status: DC | PRN

## 2014-03-05 MED ORDER — LIDOCAINE HCL (PF) 1 % IJ SOLN
INTRAMUSCULAR | Status: AC
  Filled 2014-03-05: qty 30

## 2014-03-05 MED ORDER — CLOPIDOGREL BISULFATE 75 MG PO TABS
75.0000 mg | ORAL_TABLET | Freq: Every day | ORAL | Status: DC
  Administered 2014-03-06: 11:00:00 75 mg via ORAL
  Filled 2014-03-05: qty 1

## 2014-03-05 MED ORDER — SODIUM CHLORIDE 0.9 % IV SOLN
INTRAVENOUS | Status: AC
Start: 2014-03-05 — End: 2014-03-05
  Administered 2014-03-05: 17:00:00 via INTRAVENOUS

## 2014-03-05 MED ORDER — ASPIRIN 81 MG PO CHEW: 81.0000 mg | CHEWABLE_TABLET | Freq: Every day | ORAL | Status: DC

## 2014-03-05 MED ORDER — FENTANYL CITRATE 0.05 MG/ML IJ SOLN
INTRAMUSCULAR | Status: AC
  Filled 2014-03-05: qty 2

## 2014-03-05 MED ORDER — HEPARIN (PORCINE) IN NACL 2-0.9 UNIT/ML-% IJ SOLN
INTRAMUSCULAR | Status: AC
  Filled 2014-03-05: qty 1000

## 2014-03-05 MED ORDER — OXYCODONE-ACETAMINOPHEN 5-325 MG PO TABS
1.0000 | ORAL_TABLET | Freq: Three times a day (TID) | ORAL | Status: DC | PRN
  Administered 2014-03-05: 15:00:00 1 via ORAL
  Filled 2014-03-05: qty 1

## 2014-03-05 NOTE — Progress Notes (Signed)
Just took pt to cath lab. Pt is alert and oriented x 4. Pt stated no pain this am. Pt received Aspirin and coreg per md orders early this am with a sip of water. Central telemetry called and notified.

## 2014-03-05 NOTE — Progress Notes (Signed)
Subjective:  Denies any chest pain or shortness of breath. Tolerated left cardiac cath/PTCA stenting to critical proximal LAD stenosis with excellent results. Noted to have patent distal RCA stent  Objective:  Vital Signs in the last 24 hours: Temp:  [97.6 F (36.4 C)-98.1 F (36.7 C)] 97.6 F (36.4 C) (08/24 1255) Pulse Rate:  [68-160] 68 (08/24 1315) Resp:  [12-19] 18 (08/24 1255) BP: (111-153)/(58-141) 150/65 mmHg (08/24 1315) SpO2:  [90 %-100 %] 98 % (08/24 1315) Weight:  [72.6 kg (160 lb 0.9 oz)] 72.6 kg (160 lb 0.9 oz) (08/24 0441)  Intake/Output from previous day: 08/23 0701 - 08/24 0700 In: 894.3 [P.O.:840; I.V.:54.3] Out: -  Intake/Output from this shift: Total I/O In: 763.3 [P.O.:240; I.V.:523.3] Out: -   Physical Exam: Neck: no adenopathy, no carotid bruit, no JVD and supple, symmetrical, trachea midline Lungs: Decrease breath sound at bases Heart: regular rate and rhythm, S1, S2 normal and Soft systolic murmur noted no S3 gallop Abdomen: soft, non-tender; bowel sounds normal; no masses,  no organomegaly Extremities: extremities normal, atraumatic, no cyanosis or edema and Right groin stable  Lab Results:  Recent Labs  03/04/14 0306 03/05/14 0317  WBC 6.4 8.1  HGB 10.8* 10.6*  PLT 157 196    Recent Labs  03/04/14 0306 03/05/14 0317  NA 132* 132*  K 3.8 5.4*  CL 93* 95*  CO2 26 25  GLUCOSE 126* 119*  BUN 44* 38*  CREATININE 1.14* 0.99    Recent Labs  03/03/14 0249  TROPONINI 0.31*   Hepatic Function Panel No results found for this basename: PROT, ALBUMIN, AST, ALT, ALKPHOS, BILITOT, BILIDIR, IBILI,  in the last 72 hours No results found for this basename: CHOL,  in the last 72 hours No results found for this basename: PROTIME,  in the last 72 hours  Imaging: Imaging results have been reviewed and Dg Chest 2 View  03/03/2014   CLINICAL DATA:  Productive cough ; COPD and coronaries stents.  EXAM: CHEST  2 VIEW  COMPARISON:  Portable chest  x-ray of March 01, 2014  FINDINGS: The lungs are well-expanded. The interstitial markings have markedly improved since the previous study. The endotracheal tube is been removed as has the esophagogastric tube. The cardiac silhouette remains enlarged. The pulmonary vascularity is not engorged. There is a tiny amount of pleural fluid blunting the posterior costophrenic angles.  IMPRESSION: There has been marked improvement in the appearance of the chest since the previous study consistent with resolution of CHF. A trace of pleural fluid at the lung bases persists.   Electronically Signed   By: David  Martinique   On: 03/03/2014 18:37    Cardiac Studies:  Assessment/Plan:  Status post acute non-Q-wave myocardial infarction status post left cath/PTCA stenting to proximal LAD with excellent results Compensated systolic heart failure Resolving Acute bronchitis  Status post Acute respiratory failure secondary to above rule out aspiration  Coronary artery disease history of non-Q-wave MI in the past status post PCI to distal RCA in November of 2000 well with similar presentation.  Ischemic cardiomyopathy  Status post possible foot causing  Hypertension  COPD  History of cerebrovascular disease  Degenerative joint disease  Peripheral vascular disease  Hypercholesteremia  GERD  History of CA of breast  Plan Continue present management  Restart losartan 50 mg daily  LOS: 4 days    Haley Harris 03/05/2014, 5:51 PM

## 2014-03-05 NOTE — Progress Notes (Signed)
UR completed Walden Statz K. Jayzon Taras, RN, BSN, [place], CCM  03/05/2014 3:56 PM

## 2014-03-05 NOTE — Progress Notes (Signed)
Site area: right groin  Site Prior to Removal:  Level 0  Pressure Applied For 20 MINUTES    Minutes Beginning at 1505  Manual:   Yes.    Patient Status During Pull:  stable  Post Pull Groin Site:  Level 0  Post Pull Instructions Given:  Yes.    Post Pull Pulses Present:  Yes.    Dressing Applied:  Yes.    Comments:  Rechecked at 1540 and 1555 with no change, dressing remains dry and intact.

## 2014-03-05 NOTE — H&P (View-Only) (Signed)
Subjective:  Patient denies any chest pain states coughing and breathing is improved. Denies any fever or chills. States noticed occasional streaks of blood and sputum no overt hemoptysis patient was started on Plavix yesterday. Patient off heparin  Objective:  Vital Signs in the last 24 hours: Temp:  [97.6 F (36.4 C)-98.4 F (36.9 C)] 98.4 F (36.9 C) (08/23 0812) Pulse Rate:  [70-120] 79 (08/23 0812) Resp:  [12-23] 15 (08/23 0812) BP: (112-134)/(53-71) 125/71 mmHg (08/23 0812) SpO2:  [96 %-100 %] 100 % (08/23 0812) Weight:  [74.481 kg (164 lb 3.2 oz)-77.6 kg (171 lb 1.2 oz)] 77.6 kg (171 lb 1.2 oz) (08/23 0442)  Intake/Output from previous day: 08/22 0701 - 08/23 0700 In: 1117.5 [P.O.:1080; I.V.:37.5] Out: -  Intake/Output from this shift:    Physical Exam: Neck: no adenopathy, no carotid bruit, no JVD and supple, symmetrical, trachea midline Lungs: Decrease breath sounds at bases air entry improved Heart: regular rate and rhythm, S1, S2 normal and Soft systolic murmur and S3 gallop noted Abdomen: soft, non-tender; bowel sounds normal; no masses,  no organomegaly Extremities: extremities normal, atraumatic, no cyanosis or edema  Lab Results:  Recent Labs  03/03/14 0249 03/04/14 0306  WBC 8.7 6.4  HGB 10.6* 10.8*  PLT 166 157    Recent Labs  03/03/14 0249 03/04/14 0306  NA 139 132*  K 4.3 3.8  CL 96 93*  CO2 28 26  GLUCOSE 123* 126*  BUN 33* 44*  CREATININE 1.16* 1.14*    Recent Labs  03/02/14 1010 03/03/14 0249  TROPONINI 0.65* 0.31*   Hepatic Function Panel  Recent Labs  03/01/14 1930  PROT 7.8  ALBUMIN 4.1  AST 44*  ALT 17  ALKPHOS 43  BILITOT 0.6   No results found for this basename: CHOL,  in the last 72 hours No results found for this basename: PROTIME,  in the last 72 hours  Imaging: Imaging results have been reviewed and Dg Chest 2 View  03/03/2014   CLINICAL DATA:  Productive cough ; COPD and coronaries stents.  EXAM: CHEST  2  VIEW  COMPARISON:  Portable chest x-ray of March 01, 2014  FINDINGS: The lungs are well-expanded. The interstitial markings have markedly improved since the previous study. The endotracheal tube is been removed as has the esophagogastric tube. The cardiac silhouette remains enlarged. The pulmonary vascularity is not engorged. There is a tiny amount of pleural fluid blunting the posterior costophrenic angles.  IMPRESSION: There has been marked improvement in the appearance of the chest since the previous study consistent with resolution of CHF. A trace of pleural fluid at the lung bases persists.   Electronically Signed   By: David  Martinique   On: 03/03/2014 18:37    Cardiac Studies:  Assessment/Plan:  Status post acute non-Q-wave myocardial infarction  Resolving acute pulmonary edema  Acute bronchitis  Acute respiratory failure secondary to above rule out aspiration  Coronary artery disease history of non-Q-wave MI in the past status post PCI to distal RCA in November of 2000 well with similar presentation.  Ischemic cardiomyopathy  Status post possible foot causing  Hypertension  COPD  History of cerebrovascular disease  Degenerative joint disease  Peripheral vascular disease  Hypercholesteremia  GERD  History of CA of breast  Acute anemia rule out GI loss  Plan Continue present management Discussed with patient and her daughter at length regarding various options of treatment i.e. medical versus left cath possible PTCA stenting its risk and benefits i.e. death  MI stroke need for emergency CABG local vascular complications risk of restenosis etc. and consented for PCI  LOS: 3 days    Haley Harris N 03/04/2014, 11:03 AM

## 2014-03-05 NOTE — Interval H&P Note (Signed)
History and Physical Interval Note:  03/05/2014 11:14 AM  Haley Harris  has presented today for surgery, with the diagnosis of cp  The various methods of treatment have been discussed with the patient and family. After consideration of risks, benefits and other options for treatment, the patient has consented to  Procedure(s): LEFT HEART CATHETERIZATION WITH CORONARY ANGIOGRAM (N/A) as a surgical intervention .  The patient's history has been reviewed, patient examined, no change in status, stable for surgery.  I have reviewed the patient's chart and labs.  Questions were answered to the patient's satisfaction.     Clent Demark

## 2014-03-05 NOTE — Cardiovascular Report (Signed)
NAMESIDRA, OLDFIELD NO.:  [id number]  MEDICAL RECORD NO.:  [id number]  LOCATION:  6C06C                        FACILITY:  [facility]  PHYSICIAN:  Maximilian Tallo N. Terrence Dupont, M.D. DATE OF BIRTH:  1929-01-08  DATE OF PROCEDURE:  03/05/2014 DATE OF DISCHARGE:                           CARDIAC CATHETERIZATION   PROCEDURES PERFORMED: 1. Left cardiac cath with selective left and right coronary     angiography, measurement of LVEDP via right groin using Judkins     technique. 2. Successful PTCA to proximal and proximal and mid junction LAD using     2.5 x 12 mm long Emerge balloon. 3. Successful deployment of 3.0 x 23 mm long Xience Alpine drug-     eluting stent in proximal and proximal and mid junction of LAD. 4. Successful postdilatation of this stent using 3.25 x 15 mm long [place]     Trek balloon.  INDICATION FOR THE PROCEDURE:  Ms. Guier is 80 year old female with past medical history significant for coronary artery disease, history of non- Q-wave myocardial infarction in November, 2012 requiring PTCA stenting to distal RCA.  She had 3.0 x 16 mm long PROMUS drug-eluting stent in distal RCA.  Ischemic cardiomyopathy.  EF approximately 40-45%, hypertension, COPD, history of cerebrovascular disease, degenerative joint disease, history of spinal stenosis, peripheral vascular disease, hypercholesteremia, GERD, depression, history of CA of breast in the past.  She came to the ER by EMS complaining of progressive increasing shortness of breath which started this afternoon and was noted to be in acute pulmonary edema requiring intubation in the ED.  As per daughter, the patient went to [place] with her friends last Friday, came back on Tuesday and was doing well until yesterday when she ate clams and developed nausea, vomiting, and diarrhea and could not keep anything down and did not take her medications.  This afternoon, the patient suddenly became short of breath and called EMS  and came to the ER.  The patient denies any chest pain but was noted to be hypoxic, acidotic requiring intubation in the ED.  The patient was noted to have possibly rate related left bundle-branch block and Cardiology consultation was called as the patient was noted to have minimally elevated troponin I. The patient was admitted under critical care team and was successfully extubated.  The patient had significant elevation of troponin I up to 1.04 which has trended down.  A 2D echo done showed marked anteroapical, marked apical hypokinesia.  Due to multiple risk factors, elevated cardiac enzymes suggestive of small non-Q-wave myocardial infarction and acute pulmonary edema as the presentation possibly due to ischemia. Discussed with patient and daughter at length regarding left cath, possible PTCA stenting, its risks and benefits, i.e., death, MI, stroke, need for emergency CABG, local vascular complications, etc., and consented for PCI.  DESCRIPTION OF PROCEDURE:  After obtaining the informed consent, the patient was brought to the cath lab and was placed on fluoroscopy table. Right groin was prepped and draped in usual fashion, 1% Xylocaine was used for local anesthesia in the right groin.  With the help of thin wall needle, 6-French arterial sheath was placed.  The sheath was aspirated  and flushed.  Next, 6-French left Judkins catheter was advanced over the wire under fluoroscopic guidance up to the ascending aorta.  Wire was pulled out.  The catheter was aspirated and connected to the Manifold.  Catheter was further advanced and engaged into left coronary ostium.  Multiple views of the left system were taken.  Next, catheter was disengaged and was pulled out over the wire and was replaced with 5-French right Judkins catheter, which was advanced over the wire under fluoroscopic guidance up to the ascending aorta.  Wire was pulled out.  The catheter was aspirated and connected to  the Manifold.  Catheter was further advanced and engaged into right coronary ostium.  A single view of right coronary artery was obtained.  Next, catheter was disengaged and was pulled out over the wire and was replaced with 5-French pigtail catheter, which was advanced over the wire under fluoroscopic guidance up to the ascending aorta.  Wire was pulled out.  The catheter was aspirated and connected to the Manifold. Catheter was further advanced across the aortic valve into the LV.  LV pressures were recorded.  Next, LV graft was done in 30-degree RAO position.  Post-angiographic pressures were recorded from LV and then pullback pressures were recorded from the aorta.  There was no gradient across the aortic valve.  Next, the pigtail catheter was pulled out over the wire.  Sheaths were aspirated and flushed.  FINDINGS:  LVEDP was 23 mmHg, left main was patent.  LAD has 80-85% proximal and proximal and mid junction focal stenosis and 20-30% mid stenosis.  Diagonal 1 and 2 were very, very small.  Diagonal 3 was large which was patent.  Left circumflex was patent.  OM1 and OM2 were very, very small.  OM3 was large which was patent.  RCA has 10-15% mid stenosis.  Distally, the stent is widely patent.  PDA and PLV branches were small.  INTERVENTIONAL PROCEDURE:  Successful PTCA to proximal and proximal and mid junction of LAD done using 2.5 x 12 mm long Emerge balloon going up to 8 atmospheric pressure for predilatation and then 3.0 x 23 mm long Xience Alpine drug-eluting stent was deployed at 13 atmospheric pressure.  The stent was post dilated using 3.25 x 15 mm long [place] Trek balloon going up to 18 atmospheric pressure.  Lesion dilated from 80-85% to 0% residual with excellent TIMI grade 3 distal flow, without evidence of dissection or distal embolization.  The patient received weight-based Angiomax and total of 600 mg of Plavix during the procedure.  The patient tolerated procedure well.   There were no complications.  The patient was transferred to recovery room in stable condition.     Allegra Lai. Terrence Dupont, M.D.     [facility]  D:  03/05/2014  T:  03/05/2014  Job:  [id number]

## 2014-03-05 NOTE — CV Procedure (Signed)
Left cardiac cath/PTCA stenting report dictated on 03/05/2014 dictation number is [phone]

## 2014-03-05 NOTE — Progress Notes (Signed)
Haley Harris - Stepdown/ICU TEAM Progress Note  Haley Harris UMP:[id number] DOB: [birth year] DOA: 03/01/2014 PCP: [name], MD  Admit HPI / Brief Narrative: 80 year old female with hx of CHF, HTN, and CAD presented to [facility] ED 8/20. She developed vomiting and diarrhea after eating some leftovers. As a result of this she was unable to take her regular medications which included several medications for HF and diuretics. She became acutely SOB and EMS was called. Upon their arrival she was in respiratory distress, and complaining of chest discomfort. She was placed on NIMV. At time of presentation to ED she was hypoxic and CXR was consistent with edema. She was intubated in ED. Marland Kitchen  Post admission her clinical picture was consistent with NSTEMI. Cardiology was consulted. She was diuresed aggressively with weight reduction of 5 lbs and has tolerated extubation. She underwent cardiac catheterization 8/24 with results pending at the time of this note.    Significant events: 8/20 - intubated - admit to ICU 8/21 - extubated 8/21 - TTE -  EF 40-45% - severe hypokinesis apex - grade Harris DD 8/24 - cardiac cath with PTCA/stent  HPI/Subjective: Alert in bed, no complaints of SOB or CP. Daughter at bedside and discussed possible need for post admit rehab pending PT/OT eval  Assessment/Plan:  Acute hypoxic respiratory failure due to Acute Pulmonary edema resolved   Possible bronchitis with productive cough Was started on doxycycline 100 bid for a total of 7 days   Acute exacerbation systolic CHF / ischemic cardiomyopathy LVEF 35-40% 2012 - remains euvolemic on exam today -home ARB on hold  NSTEMI - acute non Q-wave MI - hx of CAD s/p PCI distal RCA Nov 2000 Cardiology following - cardiac cath 8/24 s/p PTCA stent  HTN BP currently well controlled  Hyperkalemia  Resolved   GERD  Code Status: FULL Family Communication: daughter at bedside   Disposition Plan: SDU - PT/OT to assist in  determining d/c venue   Consultants: Cardiology - Harwani   Antibiotics: Doxycycline 8/22 >>  DVT prophylaxis: SQ heparin   Objective: Blood pressure 146/106, pulse 82, temperature 97.8 F (36.6 C), temperature source Oral, resp. rate 19, height 5\' 5"  (Harris.651 m), weight 160 lb 0.9 oz (72.6 kg), SpO2 99.00%.  Intake/Output Summary (Last 24 hours) at 03/05/14 1013 Last data filed at 03/05/14 0600  Gross per 24 hour  Intake 654.32 ml  Output      0 ml  Net 654.32 ml   Exam: General: No acute respiratory distress Lungs: Clear to auscultation bilaterally and stable on RA Cardiovascular: Regular rate and rhythm without murmur gallop or rub normal S1 and S2 Abdomen: Nontender, nondistended, soft, bowel sounds positive, no rebound, no ascites, no appreciable mass Extremities: No significant cyanosis, clubbing, or edema bilateral lower extremities  Data Reviewed: Basic Metabolic Panel:  Recent Labs Lab 03/02/14 0133 03/02/14 0550 03/03/14 0249 03/04/14 0306 03/05/14 0317  NA 136* 136* 139 132* 132*  K 4.5 4.2 4.3 3.8 5.4*  CL 96 96 96 93* 95*  CO2 23 20 28 26 25   GLUCOSE 97 101* 123* 126* 119*  BUN 28* 30* 33* 44* 38*  CREATININE 0.97 Harris.03 Harris.16* Harris.14* 0.99  CALCIUM 8.6 8.8 8.6 8.Harris* 8.0*  MG  --   --  Harris.9 Harris.8  --   PHOS  --   --  2.7 3.2  --     Liver Function Tests:  Recent Labs Lab 03/01/14 1930  AST 44*  ALT  17  ALKPHOS 43  BILITOT 0.6  PROT 7.8  ALBUMIN 4.Harris   Coags:  Recent Labs Lab 03/05/14 0545  INR 0.92   No results found for this basename: PTT,  in the last 168 hours  CBC:  Recent Labs Lab 03/01/14 1930 03/01/14 1944 03/02/14 0550 03/03/14 0249 03/04/14 0306 03/05/14 0317  WBC 12.8*  --  12.2* 8.7 6.4 8.Harris  NEUTROABS 10.8*  --   --   --   --   --   HGB 13.3 15.0 12.Harris 10.6* 10.8* 10.6*  HCT 39.2 44.0 34.8* 31.3* 31.6* 31.3*  MCV 97.8  --  96.4 99.Harris 98.Harris 100.6*  PLT 245  --  206 166 157 196   Cardiac Enzymes:  Recent Labs Lab  03/01/14 2245 03/02/14 0550 03/02/14 1010 03/03/14 0249  TROPONINI Harris.04* 0.72* 0.65* 0.31*   CBG:  Recent Labs Lab 03/02/14 0246 03/02/14 0458 03/02/14 0742 03/02/14 1129  GLUCAP 82 92 105* 125*    Recent Results (from the past 240 hour(s))  MRSA PCR SCREENING     Status: None   Collection Time    03/01/14 11:47 PM      Result Value Ref Range Status   MRSA by PCR NEGATIVE  NEGATIVE Final   Comment:            The GeneXpert MRSA Assay (FDA     approved for NASAL specimens     only), is one component of a     comprehensive MRSA colonization     surveillance program. It is not     intended to diagnose MRSA     infection nor to guide or     monitor treatment for     MRSA infections.  CULTURE, BLOOD (ROUTINE X 2)     Status: None   Collection Time    03/02/14  3:45 PM      Result Value Ref Range Status   Specimen Description BLOOD LEFT HAND   Final   Special Requests BOTTLES DRAWN AEROBIC ONLY 4CC   Final   Culture  Setup Time     Final   Value: 03/02/2014 22:38     Performed at [facility]   Culture     Final   Value:        BLOOD CULTURE RECEIVED NO GROWTH TO DATE CULTURE WILL BE HELD FOR 5 DAYS BEFORE ISSUING A FINAL NEGATIVE REPORT     Performed at [facility]   Report Status PENDING   Incomplete  CULTURE, BLOOD (ROUTINE X 2)     Status: None   Collection Time    03/02/14  4:00 PM      Result Value Ref Range Status   Specimen Description BLOOD LEFT FOREARM   Final   Special Requests BOTTLES DRAWN AEROBIC AND ANAEROBIC 10CC   Final   Culture  Setup Time     Final   Value: 03/02/2014 22:39     Performed at [facility]   Culture     Final   Value:        BLOOD CULTURE RECEIVED NO GROWTH TO DATE CULTURE WILL BE HELD FOR 5 DAYS BEFORE ISSUING A FINAL NEGATIVE REPORT     Performed at [facility]   Report Status PENDING   Incomplete     Studies:  Recent x-ray studies have been reviewed in detail by the Attending  Physician  Scheduled Meds:  Scheduled Meds: . aspirin EC  81 mg  Oral Daily  . atorvastatin  80 mg Oral q1800  . carvedilol  6.25 mg Oral BID WC  . doxycycline  100 mg Oral Q12H  . FLUoxetine  20 mg Oral Daily  . furosemide  40 mg Intravenous Daily  . heparin subcutaneous  5,000 Units Subcutaneous 3 times per day  . montelukast  10 mg Oral Daily  . sodium chloride  3 mL Intravenous Q12H  . spironolactone  25 mg Per Tube Daily   Time spent on care of this patient: 35 mins  ELLIS,ALLISON L. , ANP  [facility] Office  [phone] Pager - Text Page per Shea Evans as per below:  On-Call/Text Page:      [email or web address]      password [email or web address]  If 7PM-7AM, please contact night-coverage [email or web address] Password [email or web address] 03/05/2014, 10:13 AM   LOS: 4 days   I have personally examined this patient and reviewed the entire database. I have reviewed the above note, made any necessary editorial changes, and agree with its content.  Cherene Altes, MD [facility]

## 2014-03-05 NOTE — Progress Notes (Signed)
Pt going to cath lab. Pt will not return to 2 central. Central telemetry notified. Pt's daughter following.

## 2014-03-06 DIAGNOSIS — J209 Acute bronchitis, unspecified: Secondary | ICD-10-CM

## 2014-03-06 DIAGNOSIS — E875 Hyperkalemia: Secondary | ICD-10-CM | POA: Diagnosis present

## 2014-03-06 DIAGNOSIS — I2589 Other forms of chronic ischemic heart disease: Secondary | ICD-10-CM

## 2014-03-06 DIAGNOSIS — I10 Essential (primary) hypertension: Secondary | ICD-10-CM

## 2014-03-06 DIAGNOSIS — I214 Non-ST elevation (NSTEMI) myocardial infarction: Secondary | ICD-10-CM | POA: Diagnosis present

## 2014-03-06 DIAGNOSIS — J810 Acute pulmonary edema: Secondary | ICD-10-CM | POA: Diagnosis present

## 2014-03-06 DIAGNOSIS — I5031 Acute diastolic (congestive) heart failure: Secondary | ICD-10-CM | POA: Diagnosis present

## 2014-03-06 LAB — BASIC METABOLIC PANEL
Anion gap: 11 (ref 5–15)
BUN: 25 mg/dL — AB (ref 6–23)
CHLORIDE: 101 meq/L (ref 96–112)
CO2: 22 meq/L (ref 19–32)
CREATININE: 0.96 mg/dL (ref 0.50–1.10)
Calcium: 7.9 mg/dL — ABNORMAL LOW (ref 8.4–10.5)
GFR calc Af Amer: 62 mL/min — ABNORMAL LOW (ref >90)
GFR calc non Af Amer: 54 mL/min — ABNORMAL LOW (ref >90)
Glucose, Bld: 102 mg/dL — ABNORMAL HIGH (ref 70–99)
Potassium: 4.5 mEq/L (ref 3.7–5.3)
Sodium: 134 mEq/L — ABNORMAL LOW (ref 137–147)

## 2014-03-06 LAB — CBC
HEMATOCRIT: 29.9 % — AB (ref 36.0–46.0)
HEMOGLOBIN: 10.1 g/dL — AB (ref 12.0–15.0)
MCH: 34.1 pg — AB (ref 26.0–34.0)
MCHC: 33.8 g/dL (ref 30.0–36.0)
MCV: 101 fL — AB (ref 78.0–100.0)
Platelets: 167 10*3/uL (ref 150–400)
RBC: 2.96 MIL/uL — ABNORMAL LOW (ref 3.87–5.11)
RDW: 13 % (ref 11.5–15.5)
WBC: 5.3 10*3/uL (ref 4.0–10.5)

## 2014-03-06 MED ORDER — CLOPIDOGREL BISULFATE 75 MG PO TABS: 75.0000 mg | ORAL_TABLET | Freq: Every day | ORAL | Status: DC

## 2014-03-06 MED ORDER — FUROSEMIDE 40 MG PO TABS
40.0000 mg | ORAL_TABLET | Freq: Every day | ORAL | Status: DC
  Administered 2014-03-06: 11:00:00 40 mg via ORAL
  Filled 2014-03-06: qty 1

## 2014-03-06 MED ORDER — DOXYCYCLINE HYCLATE 100 MG PO CAPS: 100.0000 mg | ORAL_CAPSULE | Freq: Two times a day (BID) | ORAL | Status: AC

## 2014-03-06 MED ORDER — DOXYCYCLINE HYCLATE 100 MG PO CAPS: 100.0000 mg | ORAL_CAPSULE | Freq: Two times a day (BID) | ORAL | Status: DC

## 2014-03-06 MED FILL — Sodium Chloride IV Soln 0.9%: INTRAVENOUS | Qty: 50 | Status: AC

## 2014-03-06 NOTE — Progress Notes (Signed)
Subjective:  Patient denies any chest pain complaints of mild shortness of breath. Did well with phase I cardiac rehabilitation with walking ambulated approximately 260 feet. Lasix was DC'd yesterday post PCI. Her renal function remained stable  Objective:  Vital Signs in the last 24 hours: Temp:  [97.6 F (36.4 C)-99 F (37.2 C)] 98.3 F (36.8 C) (08/25 0730) Pulse Rate:  [68-160] 80 (08/25 0730) Resp:  [15-20] 20 (08/25 0730) BP: (90-157)/(41-89) 119/64 mmHg (08/25 0730) SpO2:  [90 %-100 %] 100 % (08/25 0730) Weight:  [73.3 kg (161 lb 9.6 oz)] 73.3 kg (161 lb 9.6 oz) (08/25 0345)  Intake/Output from previous day: 08/24 0701 - 08/25 0700 In: 1880.9 [P.O.:480; I.V.:1400.9] Out: 600 [Urine:600] Intake/Output from this shift:    Physical Exam: Neck: no adenopathy, no carotid bruit, no JVD and supple, symmetrical, trachea midline Lungs: Decreased breath sound at bases Heart: regular rate and rhythm, S1, S2 normal and Soft systolic murmur noted Abdomen: soft, non-tender; bowel sounds normal; no masses,  no organomegaly Extremities: extremities normal, atraumatic, no cyanosis or edema and Right groin stable with no evidence of hematoma  Lab Results:  Recent Labs  03/05/14 0317 03/06/14 0543  WBC 8.1 5.3  HGB 10.6* 10.1*  PLT 196 167    Recent Labs  03/05/14 0317 03/06/14 0543  NA 132* 134*  K 5.4* 4.5  CL 95* 101  CO2 25 22  GLUCOSE 119* 102*  BUN 38* 25*  CREATININE 0.99 0.96   No results found for this basename: TROPONINI, CK, MB,  in the last 72 hours Hepatic Function Panel No results found for this basename: PROT, ALBUMIN, AST, ALT, ALKPHOS, BILITOT, BILIDIR, IBILI,  in the last 72 hours No results found for this basename: CHOL,  in the last 72 hours No results found for this basename: PROTIME,  in the last 72 hours  Imaging: Imaging results have been reviewed and No results found.  Cardiac Studies:  Assessment/Plan:  Status post acute non-Q-wave  myocardial infarction status post left cath/PTCA stenting to proximal LAD with excellent results  Compensated systolic heart failure  Resolving Acute bronchitis  Status post Acute respiratory failure secondary to above rule out aspiration  Coronary artery disease histplan planory of non-Q-wave MI in the past status post PCI to distal RCA in November of 2000 well with similar presentation.  Ischemic cardiomyopathy  Status post possible foot causing  Hypertension  COPD  History of cerebrovascular disease  Degenerative joint disease  Peripheral vascular disease  Hypercholesteremia  GERD  History of CA of breast  Plan Restart Lasix 40 mg daily  Increase ambulation as tolerated Okay to discharge from cardiac point of view  LOS: 5 days    Ladainian Therien N 03/06/2014, 8:45 AM

## 2014-03-06 NOTE — Clinical Social Work Note (Addendum)
Patient in need of short-term rehab and is medically stable for discharge today. Patient chose [facility] skilled nursing facility for ST rehab and facility can take her. CSW received call from facility admissions staff person [place] requesting patient's Medicare number. CSW talked with patient and her daughter and she is not sure that she still has her Medicare card. A family member at home looked for card without success. Calls made to financial counselors M. Martinique and A. Honeycutt and messages left. Call made to [place] at [facility] (4:37 pm) and message left giving update regarding attempts to get Medicare number. CSW will continue to follow and facilitate discharge to [facility] once needed information provided.   Haley Harris, MSW, LCSW [phone]

## 2014-03-06 NOTE — Progress Notes (Signed)
CARDIAC REHAB PHASE I   PRE:  Rate/Rhythm: 79 SR  BP:  Supine:   Sitting: 138/84  Standing:    SaO2: 99% 1.5L  MODE:  Ambulation: 260 ft   POST:  Rate/Rhythm: 97SR  BP:  Supine:   Sitting: 150/85  Standing:    SaO2: 98-99% 2L 0800-0840 Have to put warm pack on fingers to get accurate sat. Would not register on ear or fingers otherwise. Pt feeling better today and very motivated. She pushed self to go farther. Stopped a couple of times to rest but stated not as SOB as she has been. Walked on 2L. Sats at 98-99% upon return to room using warm pack. To recliner and set up breakfast. Turned to 1L for rest. Call bell in reach. Tolerated well. PT to see today.   Graylon Good, RN BSN  03/06/2014 8:35 AM

## 2014-03-06 NOTE — Evaluation (Signed)
Occupational Therapy Evaluation Patient Details Name: Haley Harris MRN: [id number] DOB: 04-06-1929 Today's Date: 03/06/2014    History of Present Illness Pt is an 80 y/o female presenting on 03/01/14 in respiratory distress. She developed vomiting and diarrhea over the 24 hours PTA after eating some leftovers. As a result of this she was unable to take her regular medications which included several medications for HF and diuretics. She became acutely SOB and EMS was called. In the ED she was found to be hypoxic and required intubation. CXR was obtained and concerning for edema. PMH includes CHF, HTN, CAD.  S/p Left cardiac cath/PTCA stenting  03/05/14.   Clinical Impression   PT admitted with hypoxic with intubation. Pt currently with functional limitiations due to the deficits listed below (see OT problem list). PTA independent from home Pt will benefit from skilled OT to increase their independence and safety with adls and balance to allow discharge SNF. Pt demonstrates DOE 3 out 4 and unable to obtain reading due to hands cold. Pt using 1 L oxygen during session and reports "my legs feel weak"     Follow Up Recommendations  SNF    Equipment Recommendations  Other (comment) (defer)    Recommendations for Other Services       Precautions / Restrictions Precautions Precautions: Fall Precaution Comments: monitor 02 Restrictions Weight Bearing Restrictions: No      Mobility Bed Mobility               General bed mobility comments: in chair on arrival  Transfers Overall transfer level: Needs assistance Equipment used: Rolling walker (2 wheeled) Transfers: Sit to/from Stand Sit to Stand: Min guard         General transfer comment: required bil UE on RW    Balance     Sitting balance-Leahy Scale: Good     Standing balance support: During functional activity Standing balance-Leahy Scale: Fair Standing balance comment: Pt able to perform hand washing and peri care  during standing without UE support for short time, however UE support recommended during gait training due to 02 needs and conservation.                            ADL Overall ADL's : Needs assistance/impaired Eating/Feeding: Independent   Grooming: Supervision/safety;Standing Grooming Details (indicate cue type and reason): fatigued quickly and needing to sit down Upper Body Bathing: Minimal assitance;Sitting   Lower Body Bathing: Moderate assistance;Sit to/from stand           Toilet Transfer: Min guard;RW;Grab bars   Toileting- Water quality scientist and Hygiene: Minimal assistance;Sit to/from stand       Functional mobility during ADLs: Min guard;Rolling walker General ADL Comments: pt SOB with toilet transfer to sink level and then to chair . pt unable to complete sentence due to DOE 3 out 4 . Unable to obtain reading due to cold hands.      Vision                     Perception     Praxis      Pertinent Vitals/Pain Pain Assessment: No/denies pain     Hand Dominance Right   Extremity/Trunk Assessment Upper Extremity Assessment Upper Extremity Assessment: Overall WFL for tasks assessed   Lower Extremity Assessment Lower Extremity Assessment: Defer to PT evaluation   Cervical / Trunk Assessment Cervical / Trunk Assessment: Kyphotic   Communication Communication Communication: [facility]  Cognition Arousal/Alertness: Awake/alert Behavior During Therapy: WFL for tasks assessed/performed Overall Cognitive Status: Within Functional Limits for tasks assessed                     General Comments       Exercises       Shoulder Instructions      Home Living Family/patient expects to be discharged to:: Skilled nursing facility                                 Additional Comments: Pt to d/c to [facility] prior to return home      Prior Functioning/Environment Level of Independence: Independent        Comments: [place]  shopping and housework being done by daughter    OT Diagnosis:     OT Problem List:     OT Treatment/Interventions:      OT Goals(Current goals can be found in the care plan section) Acute Rehab OT Goals Patient Stated Goal: To return home safely.  OT Goal Formulation: With patient/family  OT Frequency:     Barriers to D/C:            Co-evaluation              End of Session Equipment Utilized During Treatment: Gait belt;Rolling walker Nurse Communication: Mobility status;Precautions  Activity Tolerance: Patient tolerated treatment well Patient left: in chair;with family/visitor present   Time: 7342-8768 OT Time Calculation (min): 25 min Charges:  OT General Charges $OT Visit: 1 Procedure OT Evaluation $Initial OT Evaluation Tier I: 1 Procedure OT Treatments $Self Care/Home Management : 8-22 mins G-Codes:    Peri Maris 03-26-2014, 3:19 PM Pager: [phone]

## 2014-03-06 NOTE — Discharge Summary (Signed)
Physician Discharge Summary  Haley Harris SWF:[id number] DOB: 1928/09/08 DOA: 03/01/2014  PCP: [name], MD  Admit date: 03/01/2014 Discharge date: 03/06/2014  Time spent: 30 minutes  Recommendations for Outpatient Follow-up:  1. Follow up with the cardiologist Dr. Terrence Dupont within 2 weeks of discharge from the hospital 2. Discharge to     SNF 3. Follow up with Dr. Silvio Pate after discharged from the SNF  Discharge Diagnoses:  Active Problems:   Acute respiratory failure with hypoxemia due to:    A) Acute pulmonary edema    B) Acute bronchitis    C) Acute systolic congestive heart failure, NYHA class 1    D) Acute diastolic heart failure, NYHA class 1   CAD /NSTEMI s/p PTCA stenting to proximal LAD   Hyperkalemia-resolved   HYPERTENSION   COPD-compensated   Discharge Condition: stable  Diet recommendation: Heart Healthy  Filed Weights   03/05/14 0441 03/06/14 0007 03/06/14 0345  Weight: 160 lb 0.9 oz (72.6 kg) 161 lb 9.6 oz (73.3 kg) 161 lb 9.6 oz (73.3 kg)    History of present illness:  80 year old female with hx of CHF, HTN, and CAD presented to [facility] ED 8/20. She developed vomiting and diarrhea after eating some leftovers. As a result of this she was unable to take her regular medications which included several medications for HF and diuretics. She became acutely SOB and EMS was called. Upon their arrival she was in respiratory distress, and complaining of chest discomfort. She was placed on NIMV. At time of presentation to ED she was hypoxic and CXR was consistent with edema. She was intubated in ED.   Post admission her clinical picture was consistent with NSTEMI. Cardiology was consulted. She was diuresed aggressively with weight reduction of 5 lbs and has tolerated extubation. She underwent cardiac catheterization 8/24 with subsequent PTCA and stenting to the LAD.  Despite adequate treatment for her bronchitis and diuresis patient continues to have hypoxemia  requiring oxygen. With talking and with ambulation her pulse oximetry saturations decreased to as low as 81% which has prompted [place] to continue oxygen upon discharge to the skilled nursing facility.  Significant events/procedures:  8/20 - intubated - admit to ICU  8/21 - extubated  8/21 - TTE - EF 40-45% - severe hypokinesis apex - grade 1 DD  8/24 - cardiac cath with PTCA/stent to LAD   Hospital Course:  Acute hypoxic respiratory failure due to Acute Pulmonary edema Resolving-still requiring oxygen as desaturates to 81% on room air with activity and talking -Lasix had been placed temporarily on hold post PCI and was resumed on 03/06/2014 by the cardiologist  Possible bronchitis with productive cough  Continue doxycycline 100 bid for a total of 7 days -last dose due 03/13/2014 -oxygen therapy as above   Acute exacerbation systolic CHF and grade 1 diastolic heart failure / ischemic cardiomyopathy  LVEF 35-40% 2012 -home ARB held pre-cardiac catheterization and has been resumed by cardiologist -Lasix as above-continue Aldactone  NSTEMI - acute non Q-wave MI/known CAD w/ prior PCI distal RCA (2000)  Cardiology following - cardiac cath 8/24 s/p PTCA stent to LAD this admission-continue carvedilol, Pravachol and baby aspirin-Plavix initiated this admission  HTN  BP currently well controlled   Hyperkalemia  Resolved    Consultations:  Cardiology  Discharge Exam: Filed Vitals:   03/06/14 1205  BP: 139/80  Pulse: 85  Temp: 98.5 F (36.9 C)  Resp: 18   General: No acute respiratory distress  Lungs: Clear to auscultation bilaterally, 2L  Cardiovascular: Regular rate and rhythm without murmur gallop or rub normal S1 and S2  Abdomen: Nontender, nondistended, soft, bowel sounds positive, no rebound, no ascites, no appreciable mass  Extremities: No significant cyanosis, clubbing, or edema bilateral lower extremities    Discharge Instructions You were cared for by a hospitalist  during your hospital stay. If you have any questions about your discharge medications or the care you received while you were in the hospital after you are discharged, you can call the unit and asked to speak with the hospitalist on call if the hospitalist that took care of you is not available. Once you are discharged, your primary care physician will handle any further medical issues. Please note that NO REFILLS for any discharge medications will be authorized once you are discharged, as it is imperative that you return to your primary care physician (or establish a relationship with a primary care physician if you do not have one) for your aftercare needs so that they can reassess your need for medications and monitor your lab values.     Medication List         aspirin 81 MG tablet  Take 81 mg by mouth daily after lunch.     CALTRATE 600+D PO  Take 1 tablet by mouth daily after lunch.     carvedilol 12.5 MG tablet  Commonly known as:  COREG  Take 12.5 mg by mouth 2 (two) times daily with a meal.     cetirizine 10 MG tablet  Commonly known as:  ZYRTEC  Take 10 mg by mouth daily. For allergies     clopidogrel 75 MG tablet  Commonly known as:  PLAVIX  Take 1 tablet (75 mg total) by mouth daily with breakfast.     denosumab 60 MG/ML Soln injection  Commonly known as:  PROLIA  Inject 60 mg into the skin every 6 (six) months. Administer in upper arm, thigh, or abdomen     doxycycline 100 MG capsule  Commonly known as:  VIBRAMYCIN  Take 1 capsule (100 mg total) by mouth 2 (two) times daily.     famotidine 20 MG tablet  Commonly known as:  PEPCID  Take 20 mg by mouth 2 (two) times daily.     FLUoxetine 20 MG capsule  Commonly known as:  PROZAC  Take 20 mg by mouth daily.     fluticasone 50 MCG/ACT nasal spray  Commonly known as:  FLONASE  Place 2 sprays into both nostrils daily as needed for allergies or rhinitis.     furosemide 40 MG tablet  Commonly known as:  LASIX  Take  40 mg by mouth daily.     gabapentin 100 MG capsule  Commonly known as:  NEURONTIN  Take 100 mg by mouth 2 (two) times daily.     losartan 50 MG tablet  Commonly known as:  COZAAR  Take 50 mg by mouth daily.     montelukast 10 MG tablet  Commonly known as:  SINGULAIR  Take 10 mg by mouth daily.     multivitamin with minerals Tabs tablet  Take 1 tablet by mouth daily after lunch.     oxyCODONE-acetaminophen 5-325 MG per tablet  Commonly known as:  PERCOCET/ROXICET  Take 2 tablets by mouth every 4 (four) hours as needed for pain.     pravastatin 80 MG tablet  Commonly known as:  PRAVACHOL  Take 80 mg by mouth daily.  spironolactone 25 MG tablet  Commonly known as:  ALDACTONE  Take 25 mg by mouth daily.     vitamin B-12 500 MCG tablet  Commonly known as:  CYANOCOBALAMIN  Take 1,000 mcg by mouth 3 (three) times a week. Mon, Wed, Fri       Allergies  Allergen Reactions  . Amoxicillin-Pot Clavulanate Anaphylaxis  . Amoxicillin     REACTION: Angioedema requiring intubation  . Cephalexin Other (See Comments)    Reaction unknown  . Clarithromycin Other (See Comments)    Reaction unknown  . Nifedipine Other (See Comments)    Reaction unknown  . Nsaids Other (See Comments)    Heart issue  . Olmesartan Medoxomil     REACTION: cough;  But tolerating Losartan 02/2014  . Tramadol Hcl Other (See Comments)    Reaction unknown   Follow-up Information   Schedule an appointment as soon as possible for a visit with [name], MD. (after discharged from SNF)    Specialties:  Internal Medicine, Pediatrics   Contact information:   [place] [address] [phone]       Schedule an appointment as soon as possible for a visit with Clent Demark, MD. (to be seen withiin 2 weeks of discharge from the hospital)    Specialty:  Cardiology   Contact information:   [address] [place] [address] [phone]        The  results of significant diagnostics from this hospitalization (including imaging, microbiology, ancillary and laboratory) are listed below for reference.    Significant Diagnostic Studies: Dg Chest 2 View  03/03/2014   CLINICAL DATA:  Productive cough ; COPD and coronaries stents.  EXAM: CHEST  2 VIEW  COMPARISON:  Portable chest x-ray of March 01, 2014  FINDINGS: The lungs are well-expanded. The interstitial markings have markedly improved since the previous study. The endotracheal tube is been removed as has the esophagogastric tube. The cardiac silhouette remains enlarged. The pulmonary vascularity is not engorged. There is a tiny amount of pleural fluid blunting the posterior costophrenic angles.  IMPRESSION: There has been marked improvement in the appearance of the chest since the previous study consistent with resolution of CHF. A trace of pleural fluid at the lung bases persists.   Electronically Signed   By: David  Martinique   On: 03/03/2014 18:37   Dg Chest Portable 1 View  03/01/2014   CLINICAL DATA:  Intubation.  EXAM: PORTABLE CHEST - 1 VIEW  COMPARISON:  07/21/2011  FINDINGS: The endotracheal tube is 2.9 cm above the carina. The NG tube is in the stomach. The heart is mildly enlarged but stable. There is tortuosity and calcification of the thoracic aorta. There is a diffuse large interstitial process in the lungs which is likely interstitial pulmonary edema. No large pleural effusion. The bony thorax is intact.  IMPRESSION: The endotracheal tube and NG tubes are in good position.  Diffuse interstitial process, likely edema.   Electronically Signed   By: Kalman Jewels M.D.   On: 03/01/2014 20:37    Microbiology: Recent Results (from the past 240 hour(s))  MRSA PCR SCREENING     Status: None   Collection Time    03/01/14 11:47 PM      Result Value Ref Range Status   MRSA by PCR NEGATIVE  NEGATIVE Final   Comment:            The GeneXpert MRSA Assay (FDA  approved for NASAL specimens       only), is one component of a     comprehensive MRSA colonization     surveillance program. It is not     intended to diagnose MRSA     infection nor to guide or     monitor treatment for     MRSA infections.  CULTURE, BLOOD (ROUTINE X 2)     Status: None   Collection Time    03/02/14  3:45 PM      Result Value Ref Range Status   Specimen Description BLOOD LEFT HAND   Final   Special Requests BOTTLES DRAWN AEROBIC ONLY 4CC   Final   Culture  Setup Time     Final   Value: 03/02/2014 22:38     Performed at [facility]   Culture     Final   Value:        BLOOD CULTURE RECEIVED NO GROWTH TO DATE CULTURE WILL BE HELD FOR 5 DAYS BEFORE ISSUING A FINAL NEGATIVE REPORT     Performed at [facility]   Report Status PENDING   Incomplete  CULTURE, BLOOD (ROUTINE X 2)     Status: None   Collection Time    03/02/14  4:00 PM      Result Value Ref Range Status   Specimen Description BLOOD LEFT FOREARM   Final   Special Requests BOTTLES DRAWN AEROBIC AND ANAEROBIC 10CC   Final   Culture  Setup Time     Final   Value: 03/02/2014 22:39     Performed at [facility]   Culture     Final   Value:        BLOOD CULTURE RECEIVED NO GROWTH TO DATE CULTURE WILL BE HELD FOR 5 DAYS BEFORE ISSUING A FINAL NEGATIVE REPORT     Performed at [facility]   Report Status PENDING   Incomplete     Labs: Basic Metabolic Panel:  Recent Labs Lab 03/02/14 0550 03/03/14 0249 03/04/14 0306 03/05/14 0317 03/06/14 0543  NA 136* 139 132* 132* 134*  K 4.2 4.3 3.8 5.4* 4.5  CL 96 96 93* 95* 101  CO2 20 28 26 25 22   GLUCOSE 101* 123* 126* 119* 102*  BUN 30* 33* 44* 38* 25*  CREATININE 1.03 1.16* 1.14* 0.99 0.96  CALCIUM 8.8 8.6 8.1* 8.0* 7.9*  MG  --  1.9 1.8  --   --   PHOS  --  2.7 3.2  --   --    Liver Function Tests:  Recent Labs Lab 03/01/14 1930  AST 44*  ALT 17  ALKPHOS 43  BILITOT 0.6  PROT 7.8  ALBUMIN 4.1   No results found for this basename: LIPASE,  AMYLASE,  in the last 168 hours No results found for this basename: AMMONIA,  in the last 168 hours CBC:  Recent Labs Lab 03/01/14 1930  03/02/14 0550 03/03/14 0249 03/04/14 0306 03/05/14 0317 03/06/14 0543  WBC 12.8*  --  12.2* 8.7 6.4 8.1 5.3  NEUTROABS 10.8*  --   --   --   --   --   --   HGB 13.3  < > 12.1 10.6* 10.8* 10.6* 10.1*  HCT 39.2  < > 34.8* 31.3* 31.6* 31.3* 29.9*  MCV 97.8  --  96.4 99.1 98.1 100.6* 101.0*  PLT 245  --  206 166 157 196 167  < > = values in this interval not displayed.  Cardiac Enzymes:  Recent Labs Lab 03/01/14 2245 03/02/14 0550 03/02/14 1010 03/03/14 0249  TROPONINI 1.04* 0.72* 0.65* 0.31*   BNP: BNP (last 3 results)  Recent Labs  03/01/14 1930  PROBNP 2785.0*   CBG:  Recent Labs Lab 03/02/14 0246 03/02/14 0458 03/02/14 0742 03/02/14 1129  GLUCAP 82 92 105* 125*       Signed:  ELLIS,ALLISON L.  ANP [facility] 03/06/2014, 1:42 PM Examined patient with ANP Ebony Hail and discussed assessment and plan and agree with the above Discussed plan of care with daughter and answered all questions.  Patient with multiple complex medical problems > 35 minutes spent in direct patient care

## 2014-03-06 NOTE — Discharge Instructions (Signed)
Myocardial Infarction A myocardial infarction (MI) is damage to the heart that is not reversible. It is also called a heart attack. An MI usually occurs when a heart (coronary) artery becomes blocked or narrowed. This cuts off the blood supply to the heart. When one or more of the heart (coronary) arteries becomes blocked, that area of the heart begins to die. This causes pain felt during an MI.  If you think you might be having an MI, call your local emergency services immediately (911 in [place] It is recommended that you chew and swallow 3 non-enteric coated baby aspirin if you do not have an aspirin allergy. Do not drive yourself to the hospital or wait to see if your symptoms go away. The sooner MI is treated, the greater the amount of heart muscle saved. Time is muscle. It can save your life. CAUSES  An MI can occur from:  A gradual buildup of a fatty substance called plaque. When plaque builds up in the arteries, this condition is called atherosclerosis. This buildup can block or reduce the blood supply to the heart artery(s).  A sudden plaque rupture within a heart artery that causes a blood clot (thrombus). A blood clot can block the heart artery which does not allow blood flow to the heart.  A severe tightening (spasm) of the heart artery. This is a less common cause of a heart attack. When a heart artery spasms, it cuts off blood flow through the artery. Spasms can occur in heart arteries that do not have atherosclerosis. RISK FACTORS People at risk for an MI usually have one or more risk factors, such as:  High blood pressure.  High cholesterol.  Smoking.  Gender. Men have a higher heart attack risk.  Overweight/obesity.  Age.  Family history.  Lack of exercise.  Diabetes.  Stress.  Excessive alcohol use.  Street drug use (cocaine and methamphetamines). SYMPTOMS  MI symptoms can vary, such as:  In both men and women, MI symptoms can include the following:  Chest  pain. The chest pain may feel like a crushing, squeezing, or "pressure" type feeling. MI pain can be "referred," meaning pain can be caused in one part of the body but felt in another part of the body. Referred MI pain may occur in the left arm, neck, or jaw. Pain may even be felt in the right arm.  Shortness of breath (dyspnea).  Heartburn or indigestion with or without vomiting, shortness of breath, or sweating (diaphoresis).  Sudden, cold sweats.  Sudden lightheadedness.  Upper back pain.  Women can have unique MI symptoms, such as:  Unexplained feelings of nervousness or anxiety.  Discomfort between the shoulder blades (scapula) or upper back.  Tingling in the hands and arms.  In elderly people (regardless of gender), MI symptoms can be subtle, such as:  Sweating (diaphoresis).  Shortness of breath (dyspnea).  General tiredness (fatigue) or not feeling well (malaise). DIAGNOSIS  Diagnosis of an MI involves several tests such as:  An assessment of your vital signs such as heart rhythm, blood pressure, respiratory rate, and oxygen level.  An EKG (ECG) to look at the electrical activity of your heart.  Blood tests called cardiac markers are drawn at scheduled times to measure proteins or enzymes released by the damaged heart muscle.  A chest X-ray.  An echocardiogram to evaluate heart motion and blood flow.  Coronary angiography (cardiac catheterization). This is a diagnostic procedure to look at the heart arteries. TREATMENT  Acute Intervention. For  an MI, the national standard in the [place] is to have an acute intervention in under 90 minutes from the time you get to the hospital. An acute intervention is a special procedure to open up the heart arteries. It is done in a treatment room called a "catheterization lab" (cath lab). Some hospitals do no have a cath lab. If you are having an MI and the hospital does not have a cath lab, the standard is to transport you  to a hospital that has one. In the cath lab, acute intervention includes:  Angioplasty. An angioplasty involves inserting a thin, flexible tube (catheter) into an artery in either your groin or wrist. The catheter is threaded to the heart arteries. A balloon at the end of the catheter is inflated to open a narrowed or blocked heart artery. During an angioplasty procedure, a small mesh tube (stent) may be used to keep the heart artery open. Depending on your condition and health history, one of two types of stents may be placed:  Drug-eluting stent (DES). A DES is coated with a medicine to prevent scar tissue from growing over the stent. With drug-eluting stents, blood thinning medicine will need to be taken for up to a year.  Bare metal stent. This type of stent has no special coating to keep tissue from growing over it. This type of stent is used if you cannot take blood thinning medicine for a prolonged time or you need surgery in the near future. After a bare metal stent is placed, blood thinning medicine will need to be taken for about a month.  If you are taking blood thinning medicine (anti-platelet therapy) after stent placement, do not stop taking it unless your caregiver says it is okay to do so. Make sure you understand how long you need to take the medicine. Surgical Intervention  If an acute intervention is not successful, surgery may be needed:  Open heart surgery (coronary artery bypass graft, CABG). CABG takes a vein (saphenous vein) from your leg. The vein is then attached to the blocked heart artery which bypasses the blockage. This then allows blood flow to the heart muscle. Additional Interventions  A "clot buster" medicine (thrombolytic) may be given. This medicine can help break up a clot in the heart artery. This medicine may be given if a person cannot get to a cath lab right away.  Intra-aortic balloon pump (IABP). If you have suffered a very severe MI and are too unstable  to go to the cath lab or to surgery, an IABP may be used. This is a temporary mechanical device used to increase blood flow to the heart and reduce the workload of the heart until you are stable enough to go to the cath lab or surgery. HOME CARE INSTRUCTIONS After an MI, you may need the following:  Medicine. Take medicine as directed by your caregiver. Medicines after an MI may:  Keep your blood from clotting easily (blood thinners).  Control your blood pressure.  Help lower your cholesterol.  Control abnormal heart rhythms.  Lifestyle changes. Under the guidance of your caregiver, lifestyle changes include:  Quitting smoking, if you smoke. Your caregiver can help you quit.  Being physically active.  Maintaining a healthy weight.  Eating a heart healthy diet. A dietitian can help you learn healthy eating options.  Managing diabetes.  Reducing stress.  Limiting alcohol intake. SEEK IMMEDIATE MEDICAL CARE IF:   You have severe chest pain, especially if the pain is crushing  or pressure-like and spreads to the arms, back, neck, or jaw. This is an emergency. Do not wait to see if the pain will go away. Get medical help at once. Call your local emergency services (911 in the [place] Do not drive yourself to the hospital.  You have shortness of breath during rest, sleep, or with activity.  You have sudden sweating or clammy skin.  You feel sick to your stomach (nauseous) and throw up (vomit).  You suddenly become lightheaded or dizzy.  You feel your heart beating rapidly or you notice "skipped" beats. MAKE SURE YOU:   Understand these instructions.  Will watch your condition.  Will get help right away if you are not doing well or get worse. Document Released: 06/29/2005 Document Revised: 07/04/2013 Document Reviewed: 09/01/2013 [facility] Patient Information 2015 [place], [place]. This information is not intended to replace advice given to you by your health care provider.  Make sure you discuss any questions you have with your health care provider. Doxycycline tablets or capsules What is this medicine? DOXYCYCLINE (dox i SYE kleen) is a tetracycline antibiotic. It kills certain bacteria or stops their growth. It is used to treat many kinds of infections, like dental, skin, respiratory, and urinary tract infections. It also treats acne, Lyme disease, malaria, and certain sexually transmitted infections. This medicine may be used for other purposes; ask your health care provider or pharmacist if you have questions. COMMON BRAND NAME(S): Acticlate, Adoxa, Adoxa CK, Adoxa Pak, Adoxa TT, Alodox, Avidoxy, Doxal, Monodox, Morgidox 1x, Morgidox 1x Kit, Morgidox 2x, Morgidox 2x Kit, Ocudox, Vibra-Tabs, Vibramycin What should I tell my health care provider before I take this medicine? They need to know if you have any of these conditions: -liver disease -long exposure to sunlight like working outdoors -stomach problems like colitis -an unusual or allergic reaction to doxycycline, tetracycline antibiotics, other medicines, foods, dyes, or preservatives -pregnant or trying to get pregnant -breast-feeding How should I use this medicine? Take this medicine by mouth with a full glass of water. Follow the directions on the prescription label. It is best to take this medicine without food, but if it upsets your stomach take it with food. Take your medicine at regular intervals. Do not take your medicine more often than directed. Take all of your medicine as directed even if you think you are better. Do not skip doses or stop your medicine early. Talk to your pediatrician regarding the use of this medicine in children. Special care may be needed. While this drug may be prescribed for children as young as 10 years old for selected conditions, precautions do apply. Overdosage: If you think you have taken too much of this medicine contact a poison control center or emergency room at  once. NOTE: This medicine is only for you. Do not share this medicine with others. What if I miss a dose? If you miss a dose, take it as soon as you can. If it is almost time for your next dose, take only that dose. Do not take double or extra doses. What may interact with this medicine? -antacids -barbiturates -birth control pills -bismuth subsalicylate -carbamazepine -methoxyflurane -other antibiotics -phenytoin -vitamins that contain iron -warfarin This list may not describe all possible interactions. Give your health care provider a list of all the medicines, herbs, non-prescription drugs, or dietary supplements you use. Also tell them if you smoke, drink alcohol, or use illegal drugs. Some items may interact with your medicine. What should I watch for while using  this medicine? Tell your doctor or health care professional if your symptoms do not improve. Do not treat diarrhea with over the counter products. Contact your doctor if you have diarrhea that lasts more than 2 days or if it is severe and watery. Do not take this medicine just before going to bed. It may not dissolve properly when you lay down and can cause pain in your throat. Drink plenty of fluids while taking this medicine to also help reduce irritation in your throat. This medicine can make you more sensitive to the sun. Keep out of the sun. If you cannot avoid being in the sun, wear protective clothing and use sunscreen. Do not use sun lamps or tanning beds/booths. Birth control pills may not work properly while you are taking this medicine. Talk to your doctor about using an extra method of birth control. If you are being treated for a sexually transmitted infection, avoid sexual contact until you have finished your treatment. Your sexual partner may also need treatment. Avoid antacids, aluminum, calcium, magnesium, and iron products for 4 hours before and 2 hours after taking a dose of this medicine. If you are using  this medicine to prevent malaria, you should still protect yourself from contact with mosquitos. Stay in screened-in areas, use mosquito nets, keep your body covered, and use an insect repellent. What side effects may I notice from receiving this medicine? Side effects that you should report to your doctor or health care professional as soon as possible: -allergic reactions like skin rash, itching or hives, swelling of the face, lips, or tongue -difficulty breathing -fever -itching in the rectal or genital area -pain on swallowing -redness, blistering, peeling or loosening of the skin, including inside the mouth -severe stomach pain or cramps -unusual bleeding or bruising -unusually weak or tired -yellowing of the eyes or skin Side effects that usually do not require medical attention (report to your doctor or health care professional if they continue or are bothersome): -diarrhea -loss of appetite -nausea, vomiting This list may not describe all possible side effects. Call your doctor for medical advice about side effects. You may report side effects to FDA at 1-800-FDA-1088. Where should I keep my medicine? Keep out of the reach of children. Store at room temperature, below 30 degrees C (86 degrees F). Protect from light. Keep container tightly closed. Throw away any unused medicine after the expiration date. Taking this medicine after the expiration date can make you seriously ill. NOTE: This sheet is a summary. It may not cover all possible information. If you have questions about this medicine, talk to your doctor, pharmacist, or health care provider.  2015, [employer]vier/[employer] Standard. (2013-05-05 13:58:06) Clopidogrel tablets What is this medicine? CLOPIDOGREL (kloh PID oh grel) helps to prevent blood clots. This medicine is used to prevent heart attack, stroke, or other vascular events in people who are at high risk. This medicine may be used for other purposes; ask your health care  provider or pharmacist if you have questions. COMMON BRAND NAME(S): Plavix What should I tell my health care provider before I take this medicine? They need to know if you have any of the following conditions: -bleeding disorder -bleeding in the brain -planned surgery -stomach or intestinal ulcers -stroke or transient ischemic attack -an unusual or allergic reaction to clopidogrel, other medicines, foods, dyes, or preservatives -pregnant or trying to get pregnant -breast-feeding How should I use this medicine? Take this medicine by mouth with a drink of water. Follow the  directions on the prescription label. You may take this medicine with or without food. Take your medicine at regular intervals. Do not take your medicine more often than directed. Talk to your pediatrician regarding the use of this medicine in children. Special care may be needed. Overdosage: If you think you have taken too much of this medicine contact a poison control center or emergency room at once. NOTE: This medicine is only for you. Do not share this medicine with others. What if I miss a dose? If you miss a dose, take it as soon as you can. If it is almost time for your next dose, take only that dose. Do not take double or extra doses. What may interact with this medicine? -aspirin -blood thinners like cilostazol, enoxaparin, ticlopidine, and warfarin -certain medicines for depression like citalopram, fluoxetine, and fluvoxamine -certain medicines for fungal infections like ketoconazole, fluconazole, and voriconazole -certain medicines for HIV infection like delavirdine, efavirenz, and etravirine -certain medicines for seizures like felbamate, oxcarbazepine, and phenytoin -chloramphenicol -fluvastatin -isoniazid, INH -medicines for inflammation like ibuprofen and naproxen -modafinil -nicardipine -over-the counter supplements like echinacea, feverfew, fish oil, garlic, ginger, ginkgo, green tea, horse  chestnut -quinine -stomach acid blockers like cimetidine, omeprazole, and esomeprazole -tamoxifen -tolbutamide -topiramate -torsemide This list may not describe all possible interactions. Give your health care provider a list of all the medicines, herbs, non-prescription drugs, or dietary supplements you use. Also tell them if you smoke, drink alcohol, or use illegal drugs. Some items may interact with your medicine. What should I watch for while using this medicine? Visit your doctor or health care professional for regular check ups. Do not stop taking your medicine unless your doctor tells you to. Notify your doctor or health care professional and seek emergency treatment if you develop breathing problems; changes in vision; chest pain; severe, sudden headache; pain, swelling, warmth in the leg; trouble speaking; sudden numbness or weakness of the face, arm or leg. These can be signs that your condition has gotten worse. If you are going to have surgery or dental work, tell your doctor or health care professional that you are taking this medicine. Certain genetic factors may reduce the effect of this medicine. Your doctor may use genetic tests to determine treatment. What side effects may I notice from receiving this medicine? Side effects that you should report to your doctor or health care professional as soon as possible: -allergic reactions like skin rash, itching or hives, swelling of the face, lips, or tongue -breathing problems -changes in vision -fever -signs and symptoms of bleeding such as bloody or black, tarry stools; red or dark-brown urine; spitting up blood or brown material that looks like coffee grounds; red spots on the skin; unusual bruising or bleeding from the eye, gums, or nose -sudden weakness -unusual bleeding or bruising Side effects that usually do not require medical attention (report to your doctor or health care professional if they continue or are  bothersome): -constipation or diarrhea -headache -pain in back or joints -stomach upset This list may not describe all possible side effects. Call your doctor for medical advice about side effects. You may report side effects to FDA at 1-800-FDA-1088. Where should I keep my medicine? Keep out of the reach of children. Store at room temperature of 59 to 86 degrees F (15 to 30 degrees C). Throw away any unused medicine after the expiration date. NOTE: This sheet is a summary. It may not cover all possible information. If you have questions about  this medicine, talk to your doctor, pharmacist, or health care provider.  2015, [employer]/Gold Standard. (2012-10-25 16:34:37) Oxygen Use at Home Oxygen can be prescribed for home use. The prescription will show the flow rate. This is how much oxygen is to be used per minute. This will be listed in liters per minute (LPM or L/M). A liter is a metric measurement of volume. You will use oxygen therapy as directed. It can be used while exercising, sleeping, or at rest. You may need oxygen continuously. Your health care provider may order a blood oxygen test (arterial blood gas or pulse oximetry test) that will show what your oxygen level is. Your health care provider will use these measurements to learn about your needs and follow your progress. Home oxygen therapy is commonly used on patients with various lung (pulmonary) related conditions. Some of these conditions include:  Asthma.  Lung cancer.  Pneumonia.  Emphysema.  Chronic bronchitis.  Cystic fibrosis.  Other lung diseases.  Pulmonary fibrosis.  Occupational lung disease.  Heart failure.  Chronic obstructive pulmonary disease (COPD). 3 COMMON WAYS OF PROVIDING OXYGEN THERAPY  Gas: The gas form of oxygen is put into variously sized cylinders or tanks. The cylinders or oxygen tanks contain compressed oxygen. The cylinder is equipped with a regulator that controls the flow rate. Because  the flow of oxygen out of the cylinder is constant, an oxygen conserving device may be attached to the system to avoid waste. This device releases the gas only when you inhale and cuts it off when you exhale. Oxygen can be provided in a small cylinder that can be carried with you. Large tanks are heavy and are only for stationary use. After use, empty tanks must be exchanged for full tanks.  Liquid: The liquid form of oxygen is put into a container similar to a thermos. When released, the liquid converts to a gas and you breathe it in just like the compressed gas. This storage method takes up less space than the compressed gas cylinder, and you can transfer the liquid to a small, portable vessel at home. Liquid oxygen is more expensive than the compressed gas, and the vessel vents when not in use. An oxygen conserving device may be built into the vessel to conserve the oxygen. Liquid oxygen is very cold, around 297 below zero.  Oxygen concentrator: This medical device filters oxygen from room air and gives almost 100% oxygen to the patient. Oxygen concentrators are powered by electricity. Benefits of this system are:  It does not need to be resupplied.  It is not as costly as liquid oxygen.  Extra tubing permits the user to move around easier. There are several types of small, portable oxygen systems available which can help you remain active and mobile. You must have a cylinder of oxygen as a backup in the event of a power failure. Advise your electric power company that you are on oxygen therapy in order to get priority service when there is a power failure. OXYGEN DELIVERY DEVICES There are 3 common ways to deliver oxygen to your body.  Nasal cannula. This is a 2-pronged device inserted in the nostrils that is connected to tubing carrying the oxygen. The tubing can rest on the ears or be attached to the frame of eyeglasses.  Mask. People who need a high flow of oxygen generally use a  mask.  Transtracheal catheter. Transtracheal oxygen therapy requires the insertion of a small, flexible tube (catheter) in the windpipe (trachea). This catheter is  held in place by a necklace. Since transtracheal oxygen bypasses the mouth, nose, and throat, a humidifier is absolutely required at flow rates of 1 LPM or greater. OXYGEN USE SAFETY TIPS  Never smoke while using oxygen. Oxygen does not burn or explode, but flammable materials will burn faster in the presence of oxygen.  Keep a Data processing manager close by. Let your fire department know that you have oxygen in your home.  Warn visitors not to smoke near you when you are using oxygen. Put up "no smoking" signs in your home where you most often use the oxygen.  When you go to a restaurant with your portable oxygen source, ask to be seated in the nonsmoking section.  Stay at least 5 feet away from gas stoves, candles, lighted fireplaces, or other heat sources.  Do not use materials that burn easily (flammable) while using your oxygen.  If you use an oxygen cylinder, make sure it is secured to some fixed object or in a stand. If you use liquid oxygen, make sure the vessel is kept upright to keep the oxygen from pouring out. Liquid oxygen is so cold it can hurt your skin.  If you use an oxygen concentrator, call your electric company so you will be given priority service if your power goes out. Avoid using extension cords, if possible.  Regularly test your smoke detectors at home to make sure they work. If you receive care in your home from a nurse or other health care provider, he or she may also check to make sure your smoke detectors work. GUIDELINES FOR CLEANING YOUR EQUIPMENT  Wash the nasal prongs with a liquid soap. Thoroughly rinse them once or twice a week.  Replace the prongs every 2 to 4 weeks. If you have an infection (cold, pneumonia) change them when you are well.  Your health care provider will give you instructions on  how to clean your transtracheal catheter.  The humidifier bottle should be washed with soap and warm water and rinsed thoroughly between each refill. Air-dry the bottle before filling it with sterile or distilled water. The bottle and its top should be disinfected after they are cleaned.  If you use an oxygen concentrator, unplug the unit. Then wipe down the cabinet with a damp cloth and dry it daily. The air filter should be cleaned at least twice a week.  Follow your home medical equipment and service company's directions for cleaning the compressor filter. HOME CARE INSTRUCTIONS   Do not change the flow of oxygen unless directed by your health care provider.  Do not use alcohol or other sedating drugs unless instructed. They slow your breathing rate.  Do not use materials that burn easily (flammable) while using your oxygen.  Always keep a spare tank of oxygen. Plan ahead for holidays when you may not be able to get a prescription filled.  Use water-based lubricants on your lips or nostrils. Do not use an oil-based product like petroleum jelly.  To prevent your cheeks or the skin behind your ears from becoming irritated, tuck some gauze under the tubing.  If you have persistent redness under your nose, call your health care provider.  When you no longer need oxygen, your doctor will have the oxygen discontinued. Oxygen is not addicting or habit forming.  Use the oxygen as instructed. Too much oxygen can be harmful and too little will not give you the benefit you need.  Shortness of breath is not always from a lack of  oxygen. If your oxygen level is not the cause of your shortness of breath, taking oxygen will not help. SEEK MEDICAL CARE IF:   You have frequent headaches.  You have shortness of breath or a lasting cough.  You have anxiety.  You are confused.  You are drowsy or sleepy all the time.  You develop an illness which aggravates your breathing.  You cannot  exercise.  You are restless.  You have blue lips or fingernails.  You have difficult or irregular breathing and it is getting worse.  You have a fever. Document Released: 09/19/2003 Document Revised: 11/13/2013 Document Reviewed: 02/08/2013 [facility] Patient Information 2015 [place], [place]. This information is not intended to replace advice given to you by your health care provider. Make sure you discuss any questions you have with your health care provider. Hypoxemia Hypoxemia occurs when your blood does not contain enough oxygen. The body cannot work well when it does not have enough oxygen because every part of your body needs oxygen. Oxygen travels to all parts of the body through your blood. Hypoxemia can develop suddenly or can come on slowly. CAUSES Some common causes of hypoxemia include:  Long-term (chronic) lung diseases, such as chronic obstructive pulmonary disease (COPD) or interstitial lung disease.  Disorders that affect breathing at night, such as sleep apnea.  Fluid buildup in your lungs (pulmonary edema).  Lung infection (pneumonia).  Lung or throat cancer.  Abnormal blood flow that bypasses the lungs (shunt).  Certain diseasesthat affect nerves or muscles.  A collapsed lung (pneumothorax).  A blood clot in the lungs (pulmonary embolus).  Certain types of heart disease.  Slow or shallow breathing (hypoventilation).  Certain medicines.  High altitudes.  Toxic chemicals and gases. SIGNS AND SYMPTOMS Not everyone who has hypoxemia will develop symptoms. If the hypoxemia developed quickly, you will likely have symptoms such as shortness of breath. If the hypoxemia came on slowly over months or years, you may not notice any symptoms. Symptoms can include:  Shortness of breath (dyspnea).  Bluish color of the skin, lips, or nail beds.  Breathing that is fast, noisy, or shallow.  A fast heartbeat.  Feeling tired or sleepy.  Being confused or  feeling anxious. DIAGNOSIS To determine if you have hypoxemia, your health care provider may perform:  A physical exam.  Blood tests.  A pulse oximetry. A sensor will be put on your finger, toe, or earlobe to measure the percent of oxygen in your blood. TREATMENT You will likely be treated with oxygen therapy. Depending on the cause of your hypoxemia, you may need oxygen for a short time (weeks or months), or you may need it indefinitely. Your health care provider may also recommend other therapies to treat the underlying cause of your hypoxemia. HOME CARE INSTRUCTIONS  Only take over-the-counter or prescription medicines as directed by your health care provider.  Follow oxygen safety measures if you are on oxygen therapy. These may include:  Always having a backup supply of oxygen.  Not allowing anyone to smoke around oxygen.  Handling the oxygen tanks carefully and as instructed.  If you smoke, quit. Stay away from people who smoke.  Follow up with your health care provider as directed. SEEK MEDICAL CARE IF:  You have any concerns about your oxygen therapy.  You still have trouble breathing.  You become short of breath when you exercise.  You are tired when you wake up.  You have a headache when you wake up. [place]  CARE IF:   Your breathing gets worse.  You have new shortness of breath with normal activity.  You have a bluish color of the skin, lips, or nail beds.  You have confusion or cloudy thinking.  You cough up dark mucus.  You have chest pain.  You have a fever. MAKE SURE YOU:  Understand these instructions.  Will watch your condition.  Will get help right away if you are not doing well or get worse. Document Released: 01/12/2011 Document Revised: 07/04/2013 Document Reviewed: 01/26/2013 [facility] Patient Information 2015 [place], [place]. This information is not intended to replace advice given to you by your health care provider.  Make sure you discuss any questions you have with your health care provider. Coronary Angiogram with Stent Coronary angiography with stent placement is a procedure to widen or open a narrow blood vessel of the heart (coronary artery). When a coronary artery becomes partially blocked, it decreases blood flow to that area. This may lead to chest pain or a heart attack (myocardial infarction). Arteries may become blocked by cholesterol buildup (plaque) in the lining or wall.  A stent is a small piece of metal that looks like a mesh or a spring. Stent placement may be done right after a coronary angiography in which a blocked artery is found or as a treatment for a heart attack.  LET [facility] CARE PROVIDER KNOW ABOUT:  Any allergies you have.   All medicines you are taking, including vitamins, herbs, eye drops, creams, and over-the-counter medicines.   Previous problems you or members of your family have had with the use of anesthetics.   Any blood disorders you have.   Previous surgeries you have had.   Medical conditions you have. RISKS AND COMPLICATIONS Generally, coronary angiography with stent is a safe procedure. However, problems can occur and include:  Damage to the heart or its blood vessels.   A return of blockage.   Bleeding, infection, or bruising at the insertion site.   A collection of blood under the skin (hematoma) at the insertion site.  Blood clot in another part of the body.   Kidney injury.   Allergic reaction to the dye or contrast used.   Bleeding into the abdomen (retroperitoneal bleeding). BEFORE THE PROCEDURE  Do not eat or drink anything after midnight on the night before the procedure or as directed by your health care provider.  Ask your health care provider about changing or stopping your regular medicines. This is especially important if you are taking diabetes medicines or blood thinners.  Your health care provider will make sure you  understand the procedure as well as the risks and potential problems associated with the procedure.  PROCEDURE  You may be given a medicine to help you relax before and during the procedure (sedative). This medicine will be given through an IV tube that is put into one of your veins.   The area where the catheter will be inserted will be shaved and cleaned. This is usually done in the groin but may be done in the fold of your arm (near your elbow) or in the wrist.   A medicine will be given to numb the area where the catheter will be inserted (local anesthetic).   The catheter will be inserted into an artery using a guide wire. A type of X-ray (fluoroscopy) will be used to help guide the catheter to the opening of the blocked artery.   A dye will then be injected into  the catheter, and X-rays will be taken. The dye will help to show where any narrowing or blockages are located in the heart arteries.   A tiny wire will be guided to the blocked spot, and a balloon will be inflated to make the artery wider. The stent will be expanded and will crush the plaque into the wall of the vessel. The stent will hold the area open like a scaffolding and improve the blood flow.   Sometimes the artery may be made wider using a laser or other tools to remove plaque.   When the blood flow is better, the catheter will be removed. The lining of the artery will grow over the stent, which stays where it was placed.  AFTER THE PROCEDURE  If the procedure is done through the leg, you will be kept in bed lying flat for about 6 hours. You will be instructed to not bend or cross your legs.   The insertion site will be checked frequently.   The pulse in your feet or wrist will be checked frequently.   Additional blood tests, X-rays, and electrocardiography may be done. Document Released: 01/03/2003 Document Revised: 11/13/2013 Document Reviewed: 01/05/2013 [facility] Patient Information 2015 [place],  [place]. This information is not intended to replace advice given to you by your health care provider. Make sure you discuss any questions you have with your health care provider. Acute Coronary Syndrome Acute coronary syndrome (ACS) is an urgent problem in which the blood and oxygen supply to the heart is critically deficient. ACS requires hospitalization because one or more coronary arteries may be blocked. ACS represents a range of conditions including:  Previous angina that is now unstable, lasts longer, happens at rest, or is more intense.  A heart attack, with heart muscle cell injury and death. There are three vital coronary arteries that supply the heart muscle with blood and oxygen so that it can pump blood effectively. If blockages to these arteries develop, blood flow to the heart muscle is reduced. If the heart does not get enough blood, angina may occur as the first warning sign. SYMPTOMS   The most common signs of angina include:  Tightness or squeezing in the chest.  Feeling of heaviness on the chest.  Discomfort in the arms, neck, back, or jaw.  Shortness of breath and nausea.  Cold, wet skin.  Angina is usually brought on by physical effort or excitement which increase the oxygen needs of the heart. These states increase the blood flow needs of the heart beyond what can be delivered.  Other symptoms that are not as common include:  Fatigue  Unexplained feelings of nervousness or anxiety  Weakness  Diarrhea  Sometimes, you may not have noticed any symptoms at all but still suffered a cardiac injury. TREATMENT   Medicines to help discomfort may include nitroglycerin (nitro) in the form of tablets or a spray for rapid relief, or longer-acting forms such as cream, patches, or capsules. (Be aware that there are many side effects and possible interactions with other drugs).  Other medicines may be used to help the heart pump better.  Procedures to open blocked  arteries including angioplasty or stent placement to keep the arteries open.  Open heart surgery may be needed when there are many blockages or they are in critical locations that are best treated with surgery. HOME CARE INSTRUCTIONS   Do not use any tobacco products including cigarettes, chewing tobacco, or electronic cigarettes.  Take one baby or adult aspirin  daily, if your health care provider advises. This helps reduce the risk of a heart attack.  It is very important that you follow the angina treatment prescribed by your health care provider. Make arrangements for proper follow-up care.  Eat a heart healthy diet with salt and fat restrictions as advised.  Regular exercise is good for you as long as it does not cause discomfort. Do not begin any new type of exercise until you check with your health care provider.  If you are overweight, you should lose weight.  Try to maintain normal blood lipid levels.  Keep your blood pressure under control as recommended by your health care provider.  You should tell your health care provider right away about any increase in the severity or frequency of your chest discomfort or angina attacks. When you have angina, you should stop what you are doing and sit down. This may bring relief in 3 to 5 minutes. If your health care provider has prescribed nitro, take it as directed.  If your health care provider has given you a follow-up appointment, it is very important to keep that appointment. Not keeping the appointment could result in a chronic or permanent injury, pain, and disability. If there is any problem keeping the appointment, you must call back to this facility for assistance. SEEK IMMEDIATE MEDICAL CARE IF:   You develop nausea, vomiting, or shortness of breath.  You feel faint, lightheaded, or pass out.  Your chest discomfort gets worse.  You are sweating or experience sudden profound fatigue.  You do not get relief of your chest  pain after 3 doses of nitro.  Your discomfort lasts longer than 15 minutes. MAKE SURE YOU:   Understand these instructions.  Will watch your condition.  Will get help right away if you are not doing well or get worse.  Take all medicines as directed by your health care provider. Document Released: 06/29/2005 Document Revised: 07/04/2013 Document Reviewed: 10/31/2013 [facility] Patient Information 2015 [place], [place]. This information is not intended to replace advice given to you by your health care provider. Make sure you discuss any questions you have with your health care provider. Acute Bronchitis Bronchitis is inflammation of the airways that extend from the windpipe into the lungs (bronchi). The inflammation often causes mucus to develop. This leads to a cough, which is the most common symptom of bronchitis.  In acute bronchitis, the condition usually develops suddenly and goes away over time, usually in a couple weeks. Smoking, allergies, and asthma can make bronchitis worse. Repeated episodes of bronchitis may cause further lung problems.  CAUSES Acute bronchitis is most often caused by the same virus that causes a cold. The virus can spread from person to person (contagious) through coughing, sneezing, and touching contaminated objects. SIGNS AND SYMPTOMS   Cough.   Fever.   Coughing up mucus.   Body aches.   Chest congestion.   Chills.   Shortness of breath.   Sore throat.  DIAGNOSIS  Acute bronchitis is usually diagnosed through a physical exam. Your health care provider will also ask you questions about your medical history. Tests, such as chest X-rays, are sometimes done to rule out other conditions.  TREATMENT  Acute bronchitis usually goes away in a couple weeks. Oftentimes, no medical treatment is necessary. Medicines are sometimes given for relief of fever or cough. Antibiotic medicines are usually not needed but may be prescribed in certain situations.  In some cases, an inhaler may be recommended to help  reduce shortness of breath and control the cough. A cool mist vaporizer may also be used to help thin bronchial secretions and make it easier to clear the chest.  HOME CARE INSTRUCTIONS  Get plenty of rest.   Drink enough fluids to keep your urine clear or pale yellow (unless you have a medical condition that requires fluid restriction). Increasing fluids may help thin your respiratory secretions (sputum) and reduce chest congestion, and it will prevent dehydration.   Take medicines only as directed by your health care provider.  If you were prescribed an antibiotic medicine, finish it all even if you start to feel better.  Avoid smoking and secondhand smoke. Exposure to cigarette smoke or irritating chemicals will make bronchitis worse. If you are a smoker, consider using nicotine gum or skin patches to help control withdrawal symptoms. Quitting smoking will help your lungs heal faster.   Reduce the chances of another bout of acute bronchitis by washing your hands frequently, avoiding people with cold symptoms, and trying not to touch your hands to your mouth, nose, or eyes.   Keep all follow-up visits as directed by your health care provider.  SEEK MEDICAL CARE IF: Your symptoms do not improve after 1 week of treatment.  SEEK IMMEDIATE MEDICAL CARE IF:  You develop an increased fever or chills.   You have chest pain.   You have severe shortness of breath.  You have bloody sputum.   You develop dehydration.  You faint or repeatedly feel like you are going to pass out.  You develop repeated vomiting.  You develop a severe headache. MAKE SURE YOU:   Understand these instructions.  Will watch your condition.  Will get help right away if you are not doing well or get worse. Document Released: 08/06/2004 Document Revised: 11/13/2013 Document Reviewed: 12/20/2012 [facility] Patient Information 2015 [place], [place]. This  information is not intended to replace advice given to you by your health care provider. Make sure you discuss any questions you have with your health care provider.

## 2014-03-06 NOTE — Progress Notes (Addendum)
Physical Therapy Treatment Patient Details Name: Haley Harris MRN: [id number] DOB: 1929-02-20 Today's Date: 03/06/2014    History of Present Illness Pt is an 80 y/o female presenting on 03/01/14 in respiratory distress. She developed vomiting and diarrhea over the 24 hours PTA after eating some leftovers. As a result of this she was unable to take her regular medications which included several medications for HF and diuretics. She became acutely SOB and EMS was called. In the ED she was found to be hypoxic and required intubation. CXR was obtained and concerning for edema. PMH includes CHF, HTN, CAD.  S/p Left cardiac cath/PTCA stenting  03/05/14.    PT Comments    Patient improved ambulation distance today with multiple short standing rest breaks when fatigued. Requires Min guard assist for all mobility due to unpredictable changes in 02 saturation and safety. Difficult to obtain accurate 02 measurement consistently due to cold fingers. No reports of chest tightness during gait training today. Recommend supplemental 02 at discharge as pt not able to maintain 02 saturation during activity. Education provided on pursed lip breathing to assist with ventilation. Will continue to follow and progress as tolerated to improve cardiovascular endurance and safe mobility.   Follow Up Recommendations  SNF;Supervision/Assistance - 24 hour     Equipment Recommendations  None recommended by PT    Recommendations for Other Services       Precautions / Restrictions Precautions Precautions: Fall Precaution Comments: monitor 02 Restrictions Weight Bearing Restrictions: No    Mobility  Bed Mobility               General bed mobility comments: Pt sitting in recliner upon PT arrival.   Transfers Overall transfer level: Needs assistance Equipment used: Rolling walker (2 wheeled) Transfers: Sit to/from Stand Sit to Stand: Min guard         General transfer comment: Min guard for safety.  Ambulated to bathroom and required VC to reach back for surface for safety. Stood from toilet with assist from grab bar.  Ambulation/Gait Ambulation/Gait assistance: Min guard Ambulation Distance (Feet): 200 Feet Assistive device: Rolling walker (2 wheeled) Gait Pattern/deviations: Step-through pattern;Decreased stride length;Trunk flexed Gait velocity: 1.1 ft/sec   General Gait Details: Generally safe ambulating with RW. Ambulated on 1L 02 [place]. Difficult to obtain Sa02 during ambulation due to cold fingers. No evidence of dyspnea. Took a few short standing rest breaks. Sa02 upon returning to room ~87% on 1L 02 [place]. VC for pursed lip breathing and it resolved to >90%..    Stairs            Wheelchair Mobility    Modified Rankin (Stroke Patients Only)       Balance     Sitting balance-Leahy Scale: Good     Standing balance support: During functional activity Standing balance-Leahy Scale: Fair Standing balance comment: Pt able to perform hand washing and peri care during standing without UE support for short time, however UE support recommended during gait training due to 02 needs and conservation.                    Cognition Arousal/Alertness: Awake/alert Behavior During Therapy: WFL for tasks assessed/performed Overall Cognitive Status: Within Functional Limits for tasks assessed                      Exercises      General Comments        Pertinent Vitals/Pain Pain Assessment: No/denies pain  Home Living                      Prior Function            PT Goals (current goals can now be found in the care plan section) Progress towards PT goals: Progressing toward goals    Frequency  Min 2X/week    PT Plan Current plan remains appropriate Goals created this date.    Co-evaluation             End of Session Equipment Utilized During Treatment: Gait belt;Oxygen Activity Tolerance: Patient tolerated treatment  well Patient left: in chair;with call bell/phone within reach;with family/visitor present     Time: 3846-6599 PT Time Calculation (min): 28 min  Charges:  $Gait Training: 8-22 mins $Therapeutic Activity: 8-22 mins                    G CodesCandy Sledge A 03-26-14, 2:24 PM Candy Sledge, [place], DPT [phone]

## 2014-03-08 LAB — CULTURE, BLOOD (ROUTINE X 2)
CULTURE: NO GROWTH
Culture: NO GROWTH

## 2014-03-09 ENCOUNTER — Non-Acute Institutional Stay (SKILLED_NURSING_FACILITY): Payer: Medicare Other | Admitting: Adult Health

## 2014-03-09 DIAGNOSIS — M810 Age-related osteoporosis without current pathological fracture: Secondary | ICD-10-CM

## 2014-03-09 DIAGNOSIS — I5021 Acute systolic (congestive) heart failure: Secondary | ICD-10-CM

## 2014-03-09 DIAGNOSIS — I214 Non-ST elevation (NSTEMI) myocardial infarction: Secondary | ICD-10-CM

## 2014-03-09 DIAGNOSIS — J208 Acute bronchitis due to other specified organisms: Secondary | ICD-10-CM

## 2014-03-09 DIAGNOSIS — G63 Polyneuropathy in diseases classified elsewhere: Secondary | ICD-10-CM

## 2014-03-09 DIAGNOSIS — I739 Peripheral vascular disease, unspecified: Secondary | ICD-10-CM

## 2014-03-09 DIAGNOSIS — J449 Chronic obstructive pulmonary disease, unspecified: Secondary | ICD-10-CM

## 2014-03-09 DIAGNOSIS — J9601 Acute respiratory failure with hypoxia: Secondary | ICD-10-CM

## 2014-03-09 DIAGNOSIS — I509 Heart failure, unspecified: Secondary | ICD-10-CM

## 2014-03-09 DIAGNOSIS — K219 Gastro-esophageal reflux disease without esophagitis: Secondary | ICD-10-CM

## 2014-03-09 DIAGNOSIS — I5031 Acute diastolic (congestive) heart failure: Secondary | ICD-10-CM

## 2014-03-09 DIAGNOSIS — J209 Acute bronchitis, unspecified: Secondary | ICD-10-CM

## 2014-03-09 DIAGNOSIS — J9600 Acute respiratory failure, unspecified whether with hypoxia or hypercapnia: Secondary | ICD-10-CM

## 2014-03-09 DIAGNOSIS — J4489 Other specified chronic obstructive pulmonary disease: Secondary | ICD-10-CM

## 2014-03-09 DIAGNOSIS — I2510 Atherosclerotic heart disease of native coronary artery without angina pectoris: Secondary | ICD-10-CM

## 2014-03-09 DIAGNOSIS — E785 Hyperlipidemia, unspecified: Secondary | ICD-10-CM

## 2014-03-11 ENCOUNTER — Encounter: Payer: Self-pay | Admitting: Adult Health

## 2014-03-11 DIAGNOSIS — G63 Polyneuropathy in diseases classified elsewhere: Secondary | ICD-10-CM | POA: Insufficient documentation

## 2014-03-11 DIAGNOSIS — I739 Peripheral vascular disease, unspecified: Secondary | ICD-10-CM

## 2014-03-11 NOTE — Progress Notes (Signed)
Patient ID: Haley Harris, female   DOB: 10-Sep-1928, 80 y.o.   MRN: [id number]     [facility]  Allergies  Allergen Reactions  . Amoxicillin-Pot Clavulanate Anaphylaxis  . Cephalexin Other (See Comments)    Reaction unknown  . Clarithromycin Other (See Comments)    Reaction unknown  . Nifedipine Other (See Comments)    Reaction unknown  . Nsaids Other (See Comments)    Heart issue  . Olmesartan Medoxomil     REACTION: cough;  But tolerating Losartan 02/2014  . Tramadol Hcl Other (See Comments)    Reaction unknown     Chief Complaint  Patient presents with  . Hospitalization Follow-up    HPI:  She has has a complex hospitalization for acute respiratory failure with hypoxemia requiring intubation overnight; acute diastolic and systolic heart failure; NSTEMI with ptca of LAD. She is now requiring the use of 02 which she was not dependent upon prior to her hospitalization. She would like to attempt to come this if possible she has been told that more than likely she will be on long term 02. Her goal is to return back home. She is concerned that she has gained 3 pounds in the past day.    Past Medical History  Diagnosis Date  . Allergic rhinitis, cause unspecified   . Unspecified cardiovascular disease   . Personal history of malignant neoplasm of breast   . Occlusion and stenosis of carotid artery without mention of cerebral infarction   . Chronic airway obstruction, not elsewhere classified   . Depressive disorder, not elsewhere classified   . Other dyspnea and respiratory abnormality   . Esophageal reflux   . Unspecified hearing loss   . Other and unspecified hyperlipidemia   . Unspecified essential hypertension   . Osteoporosis, unspecified   . Peripheral vascular disease, unspecified   . Unspecified urinary incontinence   . Spinal stenosis   . ACE-inhibitor cough   . Angina   . Heart murmur     aS CHILD  . Shortness of breath   . Recurrent upper respiratory  infection (URI)   . Anxiety   . Pneumonia   . 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 Myocardial infarct   . Osteoarthrosis, unspecified whether generalized or localized, unspecified site   . Malignant neoplasm of breast (female), unspecified site   . Compression fracture of T12 vertebra 10/28/2011    Past Surgical History  Procedure Laterality Date  . Mastectomy  1987    Right  . Breast reconstruction  1998    Reconstruction   . Reduction mammaplasty  1998    Left  . Mastoidectomy  childhood  . Appendectomy  1948  . Lumbar laminectomy  2003  . Shoulder surgery  02/2005    Bilateral fractures with multiple surgeries  . Breast implant removal  06/12/09    right  . Abdominal hysterectomy    . Fracture surgery      fracture right elbow  . Coronary angioplasty  11/12    distal RCA  . Mastectomy      VITAL SIGNS BP 100/60  Pulse 88  Ht 5\' 5"  (1.651 m)  Wt 170 lb (77.111 kg)  BMI 28.29 kg/m2   Patient's Medications  New Prescriptions   No medications on file  Previous Medications   ASPIRIN 81 MG TABLET    Take 81 mg by mouth daily after lunch.    CALCIUM CARBONATE-VITAMIN D (CALTRATE 600+D PO)  Take 1 tablet by mouth daily after lunch.    CARVEDILOL (COREG) 12.5 MG TABLET    Take 12.5 mg by mouth 2 (two) times daily with a meal.   CETIRIZINE (ZYRTEC) 10 MG TABLET    Take 10 mg by mouth daily. For allergies   CLOPIDOGREL (PLAVIX) 75 MG TABLET    Take 1 tablet (75 mg total) by mouth daily with breakfast.   DENOSUMAB (PROLIA) 60 MG/ML SOLN INJECTION    Inject 60 mg into the skin every 6 (six) months. Administer in upper arm, thigh, or abdomen   DOXYCYCLINE (VIBRAMYCIN) 100 MG CAPSULE    Take 1 capsule (100 mg total) by mouth 2 (two) times daily.   FAMOTIDINE (PEPCID) 20 MG TABLET    Take 20 mg by mouth 2 (two) times daily.    FLUOXETINE (PROZAC) 20 MG CAPSULE    Take 20 mg by mouth daily.   FLUTICASONE (FLONASE) 50 MCG/ACT NASAL SPRAY    Place 2 sprays into  both nostrils daily as needed for allergies or rhinitis.   FUROSEMIDE (LASIX) 40 MG TABLET    Take 40 mg by mouth daily.   GABAPENTIN (NEURONTIN) 100 MG CAPSULE    Take 100 mg by mouth 2 (two) times daily.   LOSARTAN (COZAAR) 50 MG TABLET    Take 50 mg by mouth daily.   MONTELUKAST (SINGULAIR) 10 MG TABLET    Take 10 mg by mouth daily.   MULTIPLE VITAMIN (MULITIVITAMIN WITH MINERALS) TABS    Take 1 tablet by mouth daily after lunch.    OXYCODONE-ACETAMINOPHEN (PERCOCET) 5-325 MG PER TABLET    Take 2 tablets by mouth every 4 (four) hours as needed for pain.    PRAVASTATIN (PRAVACHOL) 80 MG TABLET    Take 80 mg by mouth daily.   SPIRONOLACTONE (ALDACTONE) 25 MG TABLET    Take 25 mg by mouth daily.   VITAMIN B-12 (CYANOCOBALAMIN) 500 MCG TABLET    Take 1,000 mcg by mouth 3 (three) times a week. Mon, Wed, Fri  Modified Medications   No medications on file  Discontinued Medications   No medications on file    SIGNIFICANT DIAGNOSTIC EXAMS  03-01-14: chest x-ray: The endotracheal tube and NG tubes are in good position. Diffuse interstitial process, likely edema.  03-02-14: 2-d echo: Left ventricle: The cavity size was normal. Systolic function was mildly to moderately reduced. The estimated ejection fraction was in the range of 40% to 45%. There is severe hypokinesis of the apical myocardium. Doppler parameters are consistent with abnormal left ventricular relaxation (grade 1 diastolic dysfunction). - Mitral valve: There was mild regurgitation. - Left atrium: The atrium was mildly dilated. - Pericardium, extracardiac: A trivial pericardial effusion was identified. Features were not consistent with tamponade physiology.  03-03-14: chest x-ray: There has been marked improvement in the appearance of the chest since the previous study consistent with resolution of CHF. A trace of pleural fluid at the lung bases persists.   LABS REVIEWED:   03-01-14: wbc 12.8; hgb 13.3; hct 39.2; mcv 97.8; plt 245  glucose 239; bun 25; bun 0.91; k+6.1; na++138; BNP 2785 03-02-14: wbc 12.2; hgb 12.1; hct 34.8; mcv 96.4; plt 206; glucose 97; bun 28; creat 0.97; k+4.5; na++136 03-04-14: wbc 6.4; hgb 10.8; hct 31.6; mcv 98.1; plt 157; glucose 126; bun 44; creat 1.14; k+3.8; na++132; mag 1.8; phos 3.2 03-06-14: wbc 5.3; hgb 10.1; hct 29.9 ;mcv 101.0; plt 167; glucose 102; bun 25;creat 0.96; k+4.5; na++134  Review of Systems  Constitutional: Negative for malaise/fatigue.  Respiratory: Negative for cough and shortness of breath.   Cardiovascular: Negative for chest pain, palpitations and leg swelling.       Has gained 3 pounds overnight   Gastrointestinal: Negative for heartburn, abdominal pain and constipation.  Musculoskeletal: Negative for joint pain and myalgias.  Skin: Negative.   Neurological: Negative for headaches.  Psychiatric/Behavioral: Negative for depression. The patient is not nervous/anxious.     Physical Exam  Constitutional: She is oriented to person, place, and time. She appears well-developed and well-nourished. No distress.  Overweight   Neck: Neck supple. No JVD present. No thyromegaly present.  Cardiovascular: Normal rate, regular rhythm and intact distal pulses.   Respiratory: Breath sounds normal. No respiratory distress. She has no wheezes.  GI: Soft. Bowel sounds are normal. She exhibits no distension. There is no tenderness.  Musculoskeletal: Normal range of motion.  Trace pedal edema present   Neurological: She is alert and oriented to person, place, and time.  Skin: Skin is warm and dry. She is not diaphoretic.  Psychiatric: She has a normal mood and affect.       ASSESSMENT/ PLAN:  1. Acute systolic and diastolic heart failure: will continue her daily weights; will give her lasix 40 mg extra one time now; will continue lasix 40 mg daily and aldactone 25 mg daily will continue to monitor her status   2. Acute respiratory failure with hypoxemia: will attempt to wean  off 02 to keep her sats >91%.  She is presently stable and will monitor her status.   3. COPD: she is presently stable: will continue flonase daily; zyrtec daily; singulair daily and will monitor her status   4. Acute bronchitis: she is improving: will have her complete her doxycycline and will monitor her status.   5. NSTEMI/CAD: she is without complaints of chest pain present; will continue Haley 81 mg daily; plavix 75 mg daily;   6. Hypertension: is stable will continue coreg 12.5 mg twice daily; cozaar 50 mg daily; will monitor   7. Dyslipidemia: will continue pravachol 80 mg daily  8. Neuropathy from pvd: will continue neurontin 100 mg twice daily for pain management; percocet 5/325 mg 2 tabs every 4 hours as needed for pain  and will monitor her status.   9. Osteoporosis is stable will continue prolia every 6 months ad ca++ with d daily  10.  Anxiety: is stable will continue prozac 20 mg daily  11. gerd will continue pepcid 20 mg twice daily     Time spent with patient 50 minutes    Ok Edwards NP [facility]  Contact [phone] Monday through Friday 8am- 5pm  After hours call [phone]

## 2014-03-12 ENCOUNTER — Non-Acute Institutional Stay (SKILLED_NURSING_FACILITY): Payer: Medicare Other | Admitting: Internal Medicine

## 2014-03-12 ENCOUNTER — Encounter: Payer: Self-pay | Admitting: Internal Medicine

## 2014-03-12 DIAGNOSIS — J9601 Acute respiratory failure with hypoxia: Secondary | ICD-10-CM

## 2014-03-12 DIAGNOSIS — I10 Essential (primary) hypertension: Secondary | ICD-10-CM

## 2014-03-12 DIAGNOSIS — F411 Generalized anxiety disorder: Secondary | ICD-10-CM

## 2014-03-12 DIAGNOSIS — F419 Anxiety disorder, unspecified: Secondary | ICD-10-CM

## 2014-03-12 DIAGNOSIS — K219 Gastro-esophageal reflux disease without esophagitis: Secondary | ICD-10-CM

## 2014-03-12 DIAGNOSIS — J9600 Acute respiratory failure, unspecified whether with hypoxia or hypercapnia: Secondary | ICD-10-CM

## 2014-03-12 DIAGNOSIS — J209 Acute bronchitis, unspecified: Secondary | ICD-10-CM

## 2014-03-12 DIAGNOSIS — J208 Acute bronchitis due to other specified organisms: Secondary | ICD-10-CM

## 2014-03-12 DIAGNOSIS — R5381 Other malaise: Secondary | ICD-10-CM

## 2014-03-12 DIAGNOSIS — I5022 Chronic systolic (congestive) heart failure: Secondary | ICD-10-CM

## 2014-03-12 DIAGNOSIS — I214 Non-ST elevation (NSTEMI) myocardial infarction: Secondary | ICD-10-CM

## 2014-03-12 NOTE — Progress Notes (Signed)
Patient ID: Haley Harris, female   DOB: [birth year], 80 y.o.   MRN: [id number]     Facility: [facility]    PCP: [name], MD  Code Status: full code  Allergies  Allergen Reactions  . Amoxicillin-Pot Clavulanate Anaphylaxis  . Cephalexin Other (See Comments)    Reaction unknown  . Clarithromycin Other (See Comments)    Reaction unknown  . Nifedipine Other (See Comments)    Reaction unknown  . Nsaids Other (See Comments)    Heart issue  . Olmesartan Medoxomil     REACTION: cough;  But tolerating Losartan 02/2014  . Tramadol Hcl Other (See Comments)    Reaction unknown    Chief Complaint: new admission  HPI:  80 y/o female patient is here for STR after hospital admission from 03/01/14-03/06/14 with acute respiratory failure in setting of chf exacerbation and acute bronchitis. She required intubation. She was noted to have NSTEMI, cardiology was consulted and underwent catheterization and now is s/p PTCA and stenting to her LAD. With her deconditioning and persistent hypoxemia, she was put on oxygen and sent to SNF for rehabilitation she has history of chf, htn, CAD She is concerned about having recent low bp readings and also wants to see if she can be weaned off oxygen. She denies any other concerns. She has been working with therapy team. She does get tired easily  Review of Systems:  Constitutional: Negative for fever, chills, diaphoresis.  HENT: Negative for congestion Eyes: Negative for eye pain, blurred vision, double vision and discharge.  Respiratory: Negative for cough, sputum production, wheezing.   Cardiovascular: Negative for chest pain, palpitations, orthopnea  Gastrointestinal: Negative for heartburn, nausea, vomiting, abdominal pain  Genitourinary: Negative for dysuria Musculoskeletal: Negative for back pain, falls Skin: Negative for itching and rash.  Neurological: Negative for dizziness, tingling, focal weakness and headaches.    Psychiatric/Behavioral: Negative for depression      Past Medical History  Diagnosis Date  . Allergic rhinitis, cause unspecified   . Unspecified cardiovascular disease   . Personal history of malignant neoplasm of breast   . Occlusion and stenosis of carotid artery without mention of cerebral infarction   . Chronic airway obstruction, not elsewhere classified   . Depressive disorder, not elsewhere classified   . Other dyspnea and respiratory abnormality   . Esophageal reflux   . Unspecified hearing loss   . Other and unspecified hyperlipidemia   . Unspecified essential hypertension   . Osteoporosis, unspecified   . Peripheral vascular disease, unspecified   . Unspecified urinary incontinence   . Spinal stenosis   . ACE-inhibitor cough   . Angina   . Heart murmur     aS CHILD  . Shortness of breath   . Recurrent upper respiratory infection (URI)   . Anxiety   . Pneumonia   . 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 Myocardial infarct   . Osteoarthrosis, unspecified whether generalized or localized, unspecified site   . Malignant neoplasm of breast (female), unspecified site   . Compression fracture of T12 vertebra 10/28/2011   Past Surgical History  Procedure Laterality Date  . Mastectomy  1987    Right  . Breast reconstruction  1998    Reconstruction   . Reduction mammaplasty  1998    Left  . Mastoidectomy  childhood  . Appendectomy  1948  . Lumbar laminectomy  2003  . Shoulder surgery  02/2005    Bilateral fractures  with multiple surgeries  . Breast implant removal  06/12/09    right  . Abdominal hysterectomy    . Fracture surgery      fracture right elbow  . Coronary angioplasty  11/12    distal RCA  . Mastectomy     Social History:   reports that she quit smoking about 39 years ago. She has never used smokeless tobacco. She reports that she drinks alcohol. She reports that she does not use illicit drugs.  Family History  Problem Relation  Age of Onset  . Heart attack Father 27  . Cancer Mother     ovarian  . Cancer Brother     lung  . Arthritis      family    Medications: Patient's Medications  New Prescriptions   No medications on file  Previous Medications   ASPIRIN 81 MG TABLET    Take 81 mg by mouth daily after lunch.    CALCIUM CARBONATE-VITAMIN D (CALTRATE 600+D PO)    Take 1 tablet by mouth daily after lunch.    CARVEDILOL (COREG) 12.5 MG TABLET    Take 12.5 mg by mouth 2 (two) times daily with a meal.   CETIRIZINE (ZYRTEC) 10 MG TABLET    Take 10 mg by mouth daily. For allergies   CLOPIDOGREL (PLAVIX) 75 MG TABLET    Take 1 tablet (75 mg total) by mouth daily with breakfast.   DENOSUMAB (PROLIA) 60 MG/ML SOLN INJECTION    Inject 60 mg into the skin every 6 (six) months. Administer in upper arm, thigh, or abdomen   DOXYCYCLINE (VIBRAMYCIN) 100 MG CAPSULE    Take 1 capsule (100 mg total) by mouth 2 (two) times daily.   FAMOTIDINE (PEPCID) 20 MG TABLET    Take 20 mg by mouth 2 (two) times daily.    FLUOXETINE (PROZAC) 20 MG CAPSULE    Take 20 mg by mouth daily.   FLUTICASONE (FLONASE) 50 MCG/ACT NASAL SPRAY    Place 2 sprays into both nostrils daily as needed for allergies or rhinitis.   FUROSEMIDE (LASIX) 40 MG TABLET    Take 40 mg by mouth daily.   GABAPENTIN (NEURONTIN) 100 MG CAPSULE    Take 100 mg by mouth 2 (two) times daily.   LOSARTAN (COZAAR) 50 MG TABLET    Take 50 mg by mouth daily.   MONTELUKAST (SINGULAIR) 10 MG TABLET    Take 10 mg by mouth daily.   MULTIPLE VITAMIN (MULITIVITAMIN WITH MINERALS) TABS    Take 1 tablet by mouth daily after lunch.    OXYCODONE-ACETAMINOPHEN (PERCOCET) 5-325 MG PER TABLET    Take 2 tablets by mouth every 4 (four) hours as needed for pain.    PRAVASTATIN (PRAVACHOL) 80 MG TABLET    Take 80 mg by mouth daily.   SPIRONOLACTONE (ALDACTONE) 25 MG TABLET    Take 25 mg by mouth daily.   VITAMIN B-12 (CYANOCOBALAMIN) 500 MCG TABLET    Take 1,000 mcg by mouth 3 (three) times a  week. Mon, Wed, Fri  Modified Medications   No medications on file  Discontinued Medications   No medications on file     Physical Exam: Filed Vitals:   03/12/14 1721  BP: 100/52  Pulse: 72  Temp: 97.6 F (36.4 C)  Resp: 18  SpO2: 96%   General- elderly female in no acute distress Head- atraumatic, normocephalic Eyes- PERRLA, EOMI, no pallor, no icterus, no discharge Neck- no lymphadenopathy Throat- moist mucus membrane Cardiovascular-  normal s1,s2, no murmurs Respiratory- bilateral clear to auscultation, no wheeze, no rhonchi, no crackles, no use of accessory muscles, on o2 by nasal canula Abdomen- bowel sounds present, soft, non tender Musculoskeletal- able to move all 4 extremities, has some leg edema Neurological- no focal deficit Skin- warm and dry Psychiatry- alert and oriented to person, place and time, normal mood and affect   Labs reviewed: Basic Metabolic Panel:  Recent Labs  03/03/14 0249 03/04/14 0306 03/05/14 0317 03/06/14 0543  NA 139 132* 132* 134*  K 4.3 3.8 5.4* 4.5  CL 96 93* 95* 101  CO2 28 26 25 22   GLUCOSE 123* 126* 119* 102*  BUN 33* 44* 38* 25*  CREATININE 1.16* 1.14* 0.99 0.96  CALCIUM 8.6 8.1* 8.0* 7.9*  MG 1.9 1.8  --   --   PHOS 2.7 3.2  --   --    Liver Function Tests:  Recent Labs  03/01/14 1930  AST 44*  ALT 17  ALKPHOS 43  BILITOT 0.6  PROT 7.8  ALBUMIN 4.1   No results found for this basename: LIPASE, AMYLASE,  in the last 8760 hours No results found for this basename: AMMONIA,  in the last 8760 hours CBC:  Recent Labs  03/01/14 1930  03/04/14 0306 03/05/14 0317 03/06/14 0543  WBC 12.8*  < > 6.4 8.1 5.3  NEUTROABS 10.8*  --   --   --   --   HGB 13.3  < > 10.8* 10.6* 10.1*  HCT 39.2  < > 31.6* 31.3* 29.9*  MCV 97.8  < > 98.1 100.6* 101.0*  PLT 245  < > 157 196 167  < > = values in this interval not displayed. Cardiac Enzymes:  Recent Labs  03/02/14 0550 03/02/14 1010 03/03/14 0249  TROPONINI 0.72*  0.65* 0.31*   BNP: No components found with this basename: POCBNP,  CBG:  Recent Labs  03/02/14 0458 03/02/14 0742 03/02/14 1129  GLUCAP 92 105* 125*    Radiological Exams: 03-01-14: chest x-ray: The endotracheal tube and NG tubes are in good position. Diffuse interstitial process, likely edema.  03-02-14: 2-d echo: Left ventricle: The cavity size was normal. Systolic function was mildly to moderately reduced. The estimated ejection fraction was in the range of 40% to 45%. There is severe hypokinesis of the apical myocardium. Doppler parameters are consistent with abnormal left ventricular relaxation (grade 1 diastolic dysfunction). - Mitral valve: There was mild regurgitation. - Left atrium: The atrium was mildly dilated. - Pericardium, extracardiac: A trivial pericardial effusion was identified. Features were not consistent with tamponade physiology.  03-03-14: chest x-ray: There has been marked improvement in the appearance of the chest since the previous study consistent with resolution of CHF. A trace of pleural fluid at the lung bases persists.   Assessment/Plan  Physical deconditioning Here for STR. Will have patient work with PT/OT as tolerated to regain strength and restore function.  Fall precautions are in place.  Acute hypoxic respiratory failure  In setting of chf and bronchitis. On o2 by nasal canula, will wean this off as tolerated to keep o2 sat > 90% at rest. To complete her doxycycline course. Continue her allergy meds and singulair  Acute bronchitis  Continue doxycycline 100 bid until 03/13/14  CHF  Stable. Daily weight check. Continue lasix and aldactone, decreased the dose of losartan with bp being soft. Monitor bmp  NSTEMI  She is s/p PTCA stent to LAD this admission. continue carvedilol, losartan, Pravachol, aspirin and plavix  HTN  BP currently well controlled, infact on lower side. Will decrease her losartan to 25 mg daily  Anxiety Continue her  prozac  gerd continue pepcid  Neuropathic pain Continue percocet prn and her neurontin for now, monitor   Family/ staff Communication: reviewed care plan with patient and nursing supervisor  Goals of care: short term rehabilitation  Labs/tests ordered- cbc, cmp    Blanchie Serve, MD  [place] [phone] (Monday-Friday 8 am - 5 pm) [phone] (afterhours)

## 2014-03-22 ENCOUNTER — Non-Acute Institutional Stay (SKILLED_NURSING_FACILITY): Payer: Medicare Other | Admitting: Adult Health

## 2014-03-22 DIAGNOSIS — I2510 Atherosclerotic heart disease of native coronary artery without angina pectoris: Secondary | ICD-10-CM

## 2014-03-22 DIAGNOSIS — I214 Non-ST elevation (NSTEMI) myocardial infarction: Secondary | ICD-10-CM

## 2014-03-22 DIAGNOSIS — I5021 Acute systolic (congestive) heart failure: Secondary | ICD-10-CM

## 2014-03-22 DIAGNOSIS — I509 Heart failure, unspecified: Secondary | ICD-10-CM

## 2014-03-23 ENCOUNTER — Telehealth: Payer: Self-pay | Admitting: Internal Medicine

## 2014-03-23 NOTE — Telephone Encounter (Signed)
ok 

## 2014-03-23 NOTE — Telephone Encounter (Signed)
Vinnie Level from [facility] called to advise that patient is being discharged home and wanted to make sure that we were aware that she will need her Prolia at her upcoming appointment on 9/21.

## 2014-03-26 ENCOUNTER — Telehealth: Payer: Self-pay

## 2014-03-26 ENCOUNTER — Other Ambulatory Visit: Payer: Self-pay | Admitting: Internal Medicine

## 2014-03-26 NOTE — Telephone Encounter (Signed)
Patient states [place] told her daughter it would reduce the chances of a stroke taking plavix and prozac together.

## 2014-03-26 NOTE — Telephone Encounter (Signed)
Actually, the pharmacist is correct. Safer to change to another med in the same class since the fluoxetine effect is just with that med  Should change to escitalopram 10mg  daily #30 x 11 Have her call me if there are any problems with this change in meds

## 2014-03-26 NOTE — Telephone Encounter (Signed)
I could not find that interaction listed for plavix Find out who told her that and then check with her pharmacist if needed

## 2014-03-26 NOTE — Telephone Encounter (Signed)
V/M was left; pt recently had heart attack with  New stint was started on Plavix by cardiologist;pt has been discharged from rehab; pt takes prozac and was advised should not take plavix and prozac. Request changing prozac to different med for alternate therapy. Please advise. requset cb [id number].

## 2014-03-27 ENCOUNTER — Telehealth: Payer: Self-pay

## 2014-03-27 MED ORDER — ESCITALOPRAM OXALATE 10 MG PO TABS: 10.0000 mg | ORAL_TABLET | Freq: Every day | ORAL | Status: DC

## 2014-03-27 NOTE — Telephone Encounter (Signed)
Spoke with nurse and advised results  

## 2014-03-27 NOTE — Telephone Encounter (Signed)
Okay Make sure she has a follow up scheduled

## 2014-03-27 NOTE — Telephone Encounter (Signed)
Haley Harris PT with [facility] left v/m requesting verbal order for home health PT 2 x a week for 4 weeks.Please advise.

## 2014-03-27 NOTE — Telephone Encounter (Signed)
Spoke with patient and advised results rx sent to pharmacy by e-script  

## 2014-03-28 ENCOUNTER — Ambulatory Visit: Payer: Medicare Other | Admitting: Podiatry

## 2014-03-30 ENCOUNTER — Telehealth: Payer: Self-pay

## 2014-03-30 NOTE — Telephone Encounter (Signed)
Connie OT with [facility] left v/m requesting verbal order for home health OT 2 x a week for 4 weeks.Please advise.  

## 2014-03-31 NOTE — Telephone Encounter (Signed)
That is fine 

## 2014-04-02 ENCOUNTER — Telehealth: Payer: Self-pay | Admitting: Internal Medicine

## 2014-04-02 ENCOUNTER — Ambulatory Visit (INDEPENDENT_AMBULATORY_CARE_PROVIDER_SITE_OTHER): Payer: Medicare Other | Admitting: Internal Medicine

## 2014-04-02 ENCOUNTER — Encounter: Payer: Self-pay | Admitting: Internal Medicine

## 2014-04-02 VITALS — HR 64 | Temp 98.6°F | Wt 168.0 lb

## 2014-04-02 DIAGNOSIS — Z7189 Other specified counseling: Secondary | ICD-10-CM

## 2014-04-02 DIAGNOSIS — I2510 Atherosclerotic heart disease of native coronary artery without angina pectoris: Secondary | ICD-10-CM

## 2014-04-02 DIAGNOSIS — J449 Chronic obstructive pulmonary disease, unspecified: Secondary | ICD-10-CM

## 2014-04-02 DIAGNOSIS — F3289 Other specified depressive episodes: Secondary | ICD-10-CM

## 2014-04-02 DIAGNOSIS — M810 Age-related osteoporosis without current pathological fracture: Secondary | ICD-10-CM

## 2014-04-02 DIAGNOSIS — J4489 Other specified chronic obstructive pulmonary disease: Secondary | ICD-10-CM

## 2014-04-02 DIAGNOSIS — I5042 Chronic combined systolic (congestive) and diastolic (congestive) heart failure: Secondary | ICD-10-CM

## 2014-04-02 DIAGNOSIS — Z0000 Encounter for general adult medical examination without abnormal findings: Secondary | ICD-10-CM

## 2014-04-02 DIAGNOSIS — E785 Hyperlipidemia, unspecified: Secondary | ICD-10-CM

## 2014-04-02 DIAGNOSIS — M1990 Unspecified osteoarthritis, unspecified site: Secondary | ICD-10-CM

## 2014-04-02 DIAGNOSIS — F329 Major depressive disorder, single episode, unspecified: Secondary | ICD-10-CM

## 2014-04-02 DIAGNOSIS — Z23 Encounter for immunization: Secondary | ICD-10-CM

## 2014-04-02 MED ORDER — DENOSUMAB 60 MG/ML ~~LOC~~ SOLN
60.0000 mg | Freq: Once | SUBCUTANEOUS | Status: AC
  Administered 2014-04-02: 60 mg via SUBCUTANEOUS

## 2014-04-02 NOTE — Assessment & Plan Note (Signed)
Neutral weight status Weighs herself daily Doesn't seem to be responding to the diuretics as much--may be due to fluid being gone

## 2014-04-02 NOTE — Assessment & Plan Note (Signed)
Occasional hears some wheezing--not clear if from COPD Has nebs for prn---told her to hold off unless sig symptoms

## 2014-04-02 NOTE — Addendum Note (Signed)
Addended by: Despina Hidden on: 04/02/2014 12:59 PM   Modules accepted: Orders

## 2014-04-02 NOTE — Assessment & Plan Note (Signed)
I have personally reviewed the Medicare Annual Wellness questionnaire and have noted 1. The patient's medical and social history 2. Their use of alcohol, tobacco or illicit drugs 3. Their current medications and supplements 4. The patient's functional ability including ADL's, fall risks, home safety risks and hearing or visual             impairment. 5. Diet and physical activities 6. Evidence for depression or mood disorders  The patients weight, height, BMI and visual acuity have been recorded in the chart I have made referrals, counseling and provided education to the patient based review of the above and I have provided the pt with a written personalized care plan for preventive services.  I have provided you with a copy of your personalized plan for preventive services. Please take the time to review along with your updated medication list.  No cancer screening due to age [place] and flu today UTD on other immunizations

## 2014-04-02 NOTE — Assessment & Plan Note (Signed)
Recent MI and LAD stent On appropriate meds

## 2014-04-02 NOTE — Progress Notes (Signed)
   Subjective:    Patient ID: Haley Harris, female    DOB: Aug 10, 1928, 80 y.o.   MRN: [id number]  HPI Here with daughter--Haley Harris Here for Medicare wellness, physical and follow up Reviewed form and advanced directives Reviewed her other doctors Occasional alcohol. No tobacco. Does exercise---other than now since the MI---doing therapy Vision and hearing are okay No falls Mood is okay Doesn't drive. Does some cooking and housework. Independent with personal care. No cognitive problems  Had bad day with nausea then finally got SOB Had to be intubated in ER Acute MI noted-- had cath/angioplasty with stent (LAD) Now on plavix Went to rehab and just came home last week SOB was due to CHF Now weighs every morning  Still careful when walking-- can't talk without dyspnea Still coughs regularly ?pet allergies---nose itchy since coming home Monitoring O2 sats and they are okay  Changed off the fluoxetine due to plavix Now on escitalopram No apparent side effects Mood seems okay  Now getting PT OT coming to help her bathe Has to pace herself  Review of Systems  Constitutional: Positive for fatigue. Negative for unexpected weight change.       Very sleepy on Friday--went out for chores with daughter and fell asleep just sitting in car  HENT: Negative for hearing loss and tinnitus.   Eyes: Negative for visual disturbance.       No diplopia or unilateral vision loss  Respiratory: Positive for cough and shortness of breath.        Stable DOE  Cardiovascular: Negative for chest pain, palpitations and leg swelling.  Gastrointestinal: Negative for nausea and abdominal pain.       No heartburn Poor appetite still  Endocrine: Negative for polydipsia and polyuria.  Genitourinary: Negative for dysuria and hematuria.  Musculoskeletal: Positive for arthralgias. Negative for back pain and joint swelling.       Knees are bad--saw Dr Louanne Skye before the MI Oxycodone does help  Skin: Negative  for rash.       No suspicious lesions  Allergic/Immunologic: Positive for environmental allergies. Negative for immunocompromised state.  Neurological: Positive for headaches. Negative for dizziness, syncope and light-headedness.  Psychiatric/Behavioral: Negative for sleep disturbance and dysphoric mood. The patient is not nervous/anxious.        Objective:   Physical Exam  Constitutional: She is oriented to person, place, and time. She appears well-developed and well-nourished. No distress.  HENT:  Head: Normocephalic and atraumatic.  Right Ear: External ear normal.  Left Ear: External ear normal.  Mouth/Throat: Oropharynx is clear and moist. No oropharyngeal exudate.  Hearing aides in   Eyes: Conjunctivae and EOM are normal. Pupils are equal, round, and reactive to light.  Neck: Normal range of motion. Neck supple. No thyromegaly present.  Cardiovascular: Normal rate, regular rhythm and normal heart sounds.  Exam reveals no gallop.   No murmur heard. Faint pedal pulses  Pulmonary/Chest: Effort normal and breath sounds normal. No respiratory distress. She has no wheezes. She has no rales.  Abdominal: Soft. There is no tenderness.  Musculoskeletal: She exhibits no edema and no tenderness.  Lymphadenopathy:    She has no cervical adenopathy.  Neurological: She is alert and oriented to person, place, and time.  President-- "[place], [place]" [phone] [id number] Recall 2/3  Skin: No rash noted. No erythema.  Psychiatric: She has a normal mood and affect. Her behavior is normal.          Assessment & Plan:

## 2014-04-02 NOTE — Telephone Encounter (Signed)
Thank you, I also spoke with patient verbally and she states her physician at [facility] told her it would always be covered?

## 2014-04-02 NOTE — Progress Notes (Signed)
Pre visit review using our clinic review tool, if applicable. No additional management support is needed unless otherwise documented below in the visit note. 

## 2014-04-02 NOTE — Assessment & Plan Note (Signed)
Knees are bad If really bad--- can try steroid injection

## 2014-04-02 NOTE — Assessment & Plan Note (Signed)
Mood has been good No problems so far with change to escitalopram

## 2014-04-02 NOTE — Assessment & Plan Note (Signed)
See social history 

## 2014-04-02 NOTE — Telephone Encounter (Signed)
Left message on machine with verbal order  

## 2014-04-02 NOTE — Telephone Encounter (Signed)
I have electronically sent pt's info for insurance verification for Prolia and will notify you as soon as I have a response. Thank you.

## 2014-04-02 NOTE — Assessment & Plan Note (Signed)
No problems on statin 

## 2014-04-03 ENCOUNTER — Other Ambulatory Visit (INDEPENDENT_AMBULATORY_CARE_PROVIDER_SITE_OTHER): Payer: Medicare Other

## 2014-04-03 LAB — COMPREHENSIVE METABOLIC PANEL
ALT: 28 U/L (ref 0–35)
AST: 31 U/L (ref 0–37)
Albumin: 3.6 g/dL (ref 3.5–5.2)
Alkaline Phosphatase: 54 U/L (ref 39–117)
BILIRUBIN TOTAL: 0.4 mg/dL (ref 0.2–1.2)
BUN: 31 mg/dL — AB (ref 6–23)
CO2: 28 meq/L (ref 19–32)
CREATININE: 1.3 mg/dL — AB (ref 0.4–1.2)
Calcium: 8.9 mg/dL (ref 8.4–10.5)
Chloride: 98 mEq/L (ref 96–112)
GFR: 40.49 mL/min — AB (ref >60.00)
GLUCOSE: 96 mg/dL (ref 70–99)
Potassium: 4.6 mEq/L (ref 3.5–5.1)
Sodium: 133 mEq/L — ABNORMAL LOW (ref 135–145)
Total Protein: 7.3 g/dL (ref 6.0–8.3)

## 2014-04-03 LAB — LIPID PANEL
Cholesterol: 121 mg/dL (ref 0–200)
HDL: 50.6 mg/dL (ref >39.00)
LDL Cholesterol: 55 mg/dL (ref 0–99)
NonHDL: 70.4
Total CHOL/HDL Ratio: 2
Triglycerides: 78 mg/dL (ref 0.0–149.0)
VLDL: 15.6 mg/dL (ref 0.0–40.0)

## 2014-04-03 LAB — CBC WITH DIFFERENTIAL/PLATELET
Basophils Absolute: 0.1 10*3/uL (ref 0.0–0.1)
Basophils Relative: 0.8 % (ref 0.0–3.0)
EOS PCT: 3.4 % (ref 0.0–5.0)
Eosinophils Absolute: 0.2 10*3/uL (ref 0.0–0.7)
HEMATOCRIT: 29.7 % — AB (ref 36.0–46.0)
Hemoglobin: 10 g/dL — ABNORMAL LOW (ref 12.0–15.0)
Lymphocytes Relative: 12.4 % (ref 12.0–46.0)
Lymphs Abs: 0.8 10*3/uL (ref 0.7–4.0)
MCHC: 33.7 g/dL (ref 30.0–36.0)
MCV: 98.5 fl (ref 78.0–100.0)
MONOS PCT: 9.6 % (ref 3.0–12.0)
Monocytes Absolute: 0.6 10*3/uL (ref 0.1–1.0)
Neutro Abs: 4.8 10*3/uL (ref 1.4–7.7)
Neutrophils Relative %: 73.8 % (ref 43.0–77.0)
PLATELETS: 251 10*3/uL (ref 150.0–400.0)
RBC: 3.02 Mil/uL — ABNORMAL LOW (ref 3.87–5.11)
RDW: 12.8 % (ref 11.5–15.5)
WBC: 6.6 10*3/uL (ref 4.0–10.5)

## 2014-04-03 LAB — T4, FREE: FREE T4: 1.3 ng/dL (ref 0.60–1.60)

## 2014-04-03 NOTE — Progress Notes (Signed)
Subjective:    Patient ID: Haley Harris, female    DOB: May 01, 1929, 80 y.o.   MRN: [id number]  HPI  Current Outpatient Prescriptions on File Prior to Visit  Medication Sig Dispense Refill  . aspirin 81 MG tablet Take 81 mg by mouth daily after lunch.       . Calcium Carbonate-Vitamin D (CALTRATE 600+D PO) Take 1 tablet by mouth daily after lunch.       . carvedilol (COREG) 12.5 MG tablet Take 12.5 mg by mouth 2 (two) times daily with a meal.      . cetirizine (ZYRTEC) 10 MG tablet Take 10 mg by mouth daily. For allergies      . clopidogrel (PLAVIX) 75 MG tablet Take 1 tablet (75 mg total) by mouth daily with breakfast.  30 tablet  0  . denosumab (PROLIA) 60 MG/ML SOLN injection Inject 60 mg into the skin every 6 (six) months. Administer in upper arm, thigh, or abdomen      . escitalopram (LEXAPRO) 10 MG tablet Take 1 tablet (10 mg total) by mouth daily.  30 tablet  11  . famotidine (PEPCID) 20 MG tablet Take 20 mg by mouth 2 (two) times daily.       . fluticasone (FLONASE) 50 MCG/ACT nasal spray Place 2 sprays into both nostrils daily as needed for allergies or rhinitis.      . furosemide (LASIX) 40 MG tablet Take 40 mg by mouth daily.      Marland Kitchen gabapentin (NEURONTIN) 100 MG capsule Take 100 mg by mouth 2 (two) times daily.      Marland Kitchen losartan (COZAAR) 50 MG tablet TAKE 1 TABLET BY MOUTH DAILY  30 tablet  11  . montelukast (SINGULAIR) 10 MG tablet Take 10 mg by mouth daily.      . Multiple Vitamin (MULITIVITAMIN WITH MINERALS) TABS Take 1 tablet by mouth daily after lunch.       . oxyCODONE-acetaminophen (PERCOCET) 5-325 MG per tablet Take 2 tablets by mouth every 4 (four) hours as needed for pain.       . pravastatin (PRAVACHOL) 80 MG tablet Take 80 mg by mouth daily.      Marland Kitchen spironolactone (ALDACTONE) 25 MG tablet Take 25 mg by mouth daily.      . vitamin B-12 (CYANOCOBALAMIN) 500 MCG tablet Take 1,000 mcg by mouth 3 (three) times a week. Mon, Wed, Fri       No current facility-administered  medications on file prior to visit.    Allergies  Allergen Reactions  . Amoxicillin-Pot Clavulanate Anaphylaxis  . Cephalexin Other (See Comments)    Reaction unknown  . Clarithromycin Other (See Comments)    Reaction unknown  . Nifedipine Other (See Comments)    Reaction unknown  . Nsaids Other (See Comments)    Heart issue  . Olmesartan Medoxomil     REACTION: cough;  But tolerating Losartan 02/2014  . Tramadol Hcl Other (See Comments)    Reaction unknown    Past Medical History  Diagnosis Date  . Allergic rhinitis, cause unspecified   . Unspecified cardiovascular disease   . Personal history of malignant neoplasm of breast   . Occlusion and stenosis of carotid artery without mention of cerebral infarction   . Chronic airway obstruction, not elsewhere classified   . Depressive disorder, not elsewhere classified   . Other dyspnea and respiratory abnormality   . Esophageal reflux   . Unspecified hearing loss   . Other and unspecified hyperlipidemia   .  Unspecified essential hypertension   . Osteoporosis, unspecified   . Peripheral vascular disease, unspecified   . Unspecified urinary incontinence   . Spinal stenosis   . ACE-inhibitor cough   . Angina   . Heart murmur     aS CHILD  . Shortness of breath   . Recurrent upper respiratory infection (URI)   . Anxiety   . Pneumonia   . 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 Myocardial infarct   . Osteoarthrosis, unspecified whether generalized or localized, unspecified site   . Malignant neoplasm of breast (female), unspecified site   . Compression fracture of T12 vertebra 10/28/2011    Past Surgical History  Procedure Laterality Date  . Mastectomy  1987    Right  . Breast reconstruction  1998    Reconstruction   . Reduction mammaplasty  1998    Left  . Mastoidectomy  childhood  . Appendectomy  1948  . Lumbar laminectomy  2003  . Shoulder surgery  02/2005    Bilateral fractures with multiple  surgeries  . Breast implant removal  06/12/09    right  . Abdominal hysterectomy    . Fracture surgery      fracture right elbow  . Coronary angioplasty  11/12    distal RCA  . Mastectomy      Family History  Problem Relation Age of Onset  . Heart attack Father 37  . Cancer Mother     ovarian  . Cancer Brother     lung  . Arthritis      family    History   Social History  . Marital Status: Widowed    Spouse Name: N/A    Number of Children: 2  . Years of Education: N/A   Occupational History  . 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     Social History Main Topics  . Smoking status: Former Smoker    Quit date: 07/13/1974  . Smokeless tobacco: Never Used  . Alcohol Use: Yes     Comment: occasional  . Drug Use: No  . Sexual Activity: No   Other Topics Concern  . Not on file   Social History Narrative   Has living will   Son or daughter would be health care POA.   Requests DNR--written 03/02/13   No tube feeds if cognitively unaware     Review of Systems     Objective:   Physical Exam        Assessment & Plan:

## 2014-04-04 ENCOUNTER — Encounter: Payer: Self-pay | Admitting: *Deleted

## 2014-04-04 NOTE — Progress Notes (Signed)
Patient ID: Haley Harris, female   DOB: [birth year], 80 y.o.   MRN: [id number]    [facility]  Allergies  Allergen Reactions  . Amoxicillin-Pot Clavulanate Anaphylaxis  . Cephalexin Other (See Comments)    Reaction unknown  . Clarithromycin Other (See Comments)    Reaction unknown  . Nifedipine Other (See Comments)    Reaction unknown  . Nsaids Other (See Comments)    Heart issue  . Olmesartan Medoxomil     REACTION: cough;  But tolerating Losartan 02/2014  . Tramadol Hcl Other (See Comments)    Reaction unknown     Chief Complaint  Patient presents with  . Discharge Note    HPI:  She is being discharged to home with home health for pt/ot/aid. She will not need dme and will need her prescriptions to be written. She will need a follow up with her pcp upon discharge. She had been hospitalized for heart failure; nstemi; and hypoxemia.   Past Medical History  Diagnosis Date  . Allergic rhinitis, cause unspecified   . Unspecified cardiovascular disease   . Personal history of malignant neoplasm of breast   . Occlusion and stenosis of carotid artery without mention of cerebral infarction   . Chronic airway obstruction, not elsewhere classified   . Depressive disorder, not elsewhere classified   . Other dyspnea and respiratory abnormality   . Esophageal reflux   . Unspecified hearing loss   . Other and unspecified hyperlipidemia   . Unspecified essential hypertension   . Osteoporosis, unspecified   . Peripheral vascular disease, unspecified   . Unspecified urinary incontinence   . Spinal stenosis   . ACE-inhibitor cough   . Angina   . Heart murmur     aS CHILD  . Shortness of breath   . Recurrent upper respiratory infection (URI)   . Anxiety   . Pneumonia   . 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 Myocardial infarct   . Osteoarthrosis, unspecified whether generalized or localized, unspecified site   . Malignant neoplasm of breast (female),  unspecified site   . Compression fracture of T12 vertebra 10/28/2011    Past Surgical History  Procedure Laterality Date  . Mastectomy  1987    Right  . Breast reconstruction  1998    Reconstruction   . Reduction mammaplasty  1998    Left  . Mastoidectomy  childhood  . Appendectomy  1948  . Lumbar laminectomy  2003  . Shoulder surgery  02/2005    Bilateral fractures with multiple surgeries  . Breast implant removal  06/12/09    right  . Abdominal hysterectomy    . Fracture surgery      fracture right elbow  . Coronary angioplasty  11/12    distal RCA  . Mastectomy      VITAL SIGNS BP 110/55  Pulse 69  Ht 5\' 5"  (1.651 m)  Wt 172 lb 12.8 oz (78.382 kg)  BMI 28.76 kg/m2    Patient's Medications  New Prescriptions   No medications on file  Previous Medications   ASPIRIN 81 MG TABLET    Take 81 mg by mouth daily after lunch.    CALCIUM CARBONATE-VITAMIN D (CALTRATE 600+D PO)    Take 1 tablet by mouth daily after lunch.    CARVEDILOL (COREG) 12.5 MG TABLET    Take 12.5 mg by mouth 2 (two) times daily with a meal.   CETIRIZINE (ZYRTEC) 10 MG TABLET  Take 10 mg by mouth daily. For allergies   CLOPIDOGREL (PLAVIX) 75 MG TABLET    Take 1 tablet (75 mg total) by mouth daily with breakfast.   DENOSUMAB (PROLIA) 60 MG/ML SOLN INJECTION    Inject 60 mg into the skin every 6 (six) months. Administer in upper arm, thigh, or abdomen   DOXYCYCLINE (VIBRAMYCIN) 100 MG CAPSULE    Take 1 capsule (100 mg total) by mouth 2 (two) times daily.   FAMOTIDINE (PEPCID) 20 MG TABLET    Take 20 mg by mouth 2 (two) times daily.    FLUOXETINE (PROZAC) 20 MG CAPSULE    Take 20 mg by mouth daily.   FLUTICASONE (FLONASE) 50 MCG/ACT NASAL SPRAY    Place 2 sprays into both nostrils daily as needed for allergies or rhinitis.   FUROSEMIDE (LASIX) 40 MG TABLET    Take 40 mg by mouth daily.   GABAPENTIN (NEURONTIN) 100 MG CAPSULE    Take 100 mg by mouth 2 (two) times daily.   LOSARTAN (COZAAR) 50 MG TABLET     Take 50 mg by mouth daily.   MONTELUKAST (SINGULAIR) 10 MG TABLET    Take 10 mg by mouth daily.   MULTIPLE VITAMIN (MULITIVITAMIN WITH MINERALS) TABS    Take 1 tablet by mouth daily after lunch.    OXYCODONE-ACETAMINOPHEN (PERCOCET) 5-325 MG PER TABLET    Take 2 tablets by mouth every 4 (four) hours as needed for pain.    PRAVASTATIN (PRAVACHOL) 80 MG TABLET    Take 80 mg by mouth daily.   SPIRONOLACTONE (ALDACTONE) 25 MG TABLET    Take 25 mg by mouth daily.   VITAMIN B-12 (CYANOCOBALAMIN) 500 MCG TABLET    Take 1,000 mcg by mouth 3 (three) times a week. Mon, Wed, Fri  Modified Medications   No medications on file  Discontinued Medications   No medications on file    SIGNIFICANT DIAGNOSTIC EXAMS  03-01-14: chest x-ray: The endotracheal tube and NG tubes are in good position. Diffuse interstitial process, likely edema.  03-02-14: 2-d echo: Left ventricle: The cavity size was normal. Systolic function was mildly to moderately reduced. The estimated ejection fraction was in the range of 40% to 45%. There is severe hypokinesis of the apical myocardium. Doppler parameters are consistent with abnormal left ventricular relaxation (grade 1 diastolic dysfunction). - Mitral valve: There was mild regurgitation. - Left atrium: The atrium was mildly dilated. - Pericardium, extracardiac: A trivial pericardial effusion was identified. Features were not consistent with tamponade physiology.  03-03-14: chest x-ray: There has been marked improvement in the appearance of the chest since the previous study consistent with resolution of CHF. A trace of pleural fluid at the lung bases persists.   LABS REVIEWED:   03-01-14: wbc 12.8; hgb 13.3; hct 39.2; mcv 97.8; plt 245 glucose 239; bun 25; bun 0.91; k+6.1; na++138; BNP 2785 03-02-14: wbc 12.2; hgb 12.1; hct 34.8; mcv 96.4; plt 206; glucose 97; bun 28; creat 0.97; k+4.5; na++136 03-04-14: wbc 6.4; hgb 10.8; hct 31.6; mcv 98.1; plt 157; glucose 126; bun 44; creat  1.14; k+3.8; na++132; mag 1.8; phos 3.2 03-06-14: wbc 5.3; hgb 10.1; hct 29.9 ;mcv 101.0; plt 167; glucose 102; bun 25;creat 0.96; k+4.5; na++134     Review of Systems  Constitutional: Negative for malaise/fatigue.  Respiratory: Negative for cough and shortness of breath.   Cardiovascular: Negative for chest pain, palpitations and leg swelling.  Gastrointestinal: Negative for heartburn, abdominal pain and constipation.  Musculoskeletal: Negative for  joint pain and myalgias.  Skin: Negative.   Neurological: Negative for headaches.  Psychiatric/Behavioral: Negative for depression. The patient is not nervous/anxious.     Physical Exam  Constitutional: She is oriented to person, place, and time. She appears well-developed and well-nourished. No distress.  Overweight   Neck: Neck supple. No JVD present. No thyromegaly present.  Cardiovascular: Normal rate, regular rhythm and intact distal pulses.   Respiratory: Breath sounds normal. No respiratory distress. She has no wheezes.  GI: Soft. Bowel sounds are normal. She exhibits no distension. There is no tenderness.  Musculoskeletal: Normal range of motion.  Trace pedal edema present   Neurological: She is alert and oriented to person, place, and time.  Skin: Skin is warm and dry. She is not diaphoretic.  Psychiatric: She has a normal mood and affect.    ASSESSMENT/PLAN  Will discharge to home with home health for pt/ot to improve up her strength; gait and level of independence with adl's; will need aid for adl care. She will not need dme. Her prescriptions have been written for a 30 day supply of her medications. She has a follow up appointment with Dr. Silvio Pate on 04-02-14.    Time spent with patient 40 minutes    Ok Edwards NP [facility]  Contact [phone] Monday through Friday 8am- 5pm  After hours call [phone]

## 2014-04-09 ENCOUNTER — Other Ambulatory Visit: Payer: Self-pay | Admitting: Internal Medicine

## 2014-04-12 NOTE — Telephone Encounter (Signed)
I'm not sure why [facility] told her that b/c plans can change. I have rec'd her insurance verification.  With an OV pt's estimated responsibility will be $5 copay plus 20% of cost of Prolia, which is approximately $175 for a total estimated responsibility of $180; w/out an OV pt will have an estimated responsibility of 20% of the cost of Prolia and admin, which will mean she will have and estimated responsibility of $180.  Please make pt aware this is an estimate and we have no way of knowing an exact amt until her insurance had paid. I have sent a copy of the summary of benefits to be scanned into her chart.   If she can't afford $180, I do have a number she can call to see if she qualifies for financial assistance from [place].  Let me know if you need the number.  If you have further questions, please let me know. Thank you.

## 2014-04-17 NOTE — Telephone Encounter (Signed)
Patient notified as instructed by telephone. Patient stated that she would appreciate a call with the telephone number to try and get assistance for the cost. Please call patient with telephone number and if she is not there is it okay to leave the number on her answering machine.

## 2014-04-18 ENCOUNTER — Other Ambulatory Visit: Payer: Self-pay

## 2014-04-18 MED ORDER — MONTELUKAST SODIUM 10 MG PO TABS: ORAL_TABLET | ORAL | Status: DC

## 2014-04-18 MED ORDER — CARVEDILOL 12.5 MG PO TABS: 12.5000 mg | ORAL_TABLET | Freq: Two times a day (BID) | ORAL | Status: DC

## 2014-04-18 MED ORDER — ESCITALOPRAM OXALATE 10 MG PO TABS: 10.0000 mg | ORAL_TABLET | Freq: Every day | ORAL | Status: DC

## 2014-04-18 MED ORDER — FUROSEMIDE 40 MG PO TABS: 40.0000 mg | ORAL_TABLET | Freq: Every day | ORAL | Status: DC

## 2014-04-18 MED ORDER — LOSARTAN POTASSIUM 50 MG PO TABS: ORAL_TABLET | ORAL | Status: DC

## 2014-04-18 NOTE — Telephone Encounter (Signed)
Notified patient and gave her the number to call Prolia to see if she qualifies for assistance.

## 2014-04-18 NOTE — Telephone Encounter (Signed)
Amy with [facility] left v/m requesting 90 day refills on carvedilol,lexapro,furosemide,losartan and singulair. Done and left v/m at [facility] to cancel any 30 day refills left on these meds.

## 2014-04-19 DIAGNOSIS — J449 Chronic obstructive pulmonary disease, unspecified: Secondary | ICD-10-CM

## 2014-04-19 DIAGNOSIS — I2510 Atherosclerotic heart disease of native coronary artery without angina pectoris: Secondary | ICD-10-CM

## 2014-04-19 DIAGNOSIS — I10 Essential (primary) hypertension: Secondary | ICD-10-CM

## 2014-04-19 DIAGNOSIS — I214 Non-ST elevation (NSTEMI) myocardial infarction: Secondary | ICD-10-CM

## 2014-04-19 DIAGNOSIS — I5041 Acute combined systolic (congestive) and diastolic (congestive) heart failure: Secondary | ICD-10-CM

## 2014-04-20 ENCOUNTER — Other Ambulatory Visit: Payer: Self-pay | Admitting: *Deleted

## 2014-04-20 MED ORDER — CLOPIDOGREL BISULFATE 75 MG PO TABS: 75.0000 mg | ORAL_TABLET | Freq: Every day | ORAL | Status: DC

## 2014-06-11 ENCOUNTER — Ambulatory Visit (INDEPENDENT_AMBULATORY_CARE_PROVIDER_SITE_OTHER): Payer: Medicare Other | Admitting: Podiatry

## 2014-06-11 ENCOUNTER — Ambulatory Visit: Payer: Medicare Other | Admitting: Podiatry

## 2014-06-11 DIAGNOSIS — M79676 Pain in unspecified toe(s): Secondary | ICD-10-CM

## 2014-06-11 DIAGNOSIS — B351 Tinea unguium: Secondary | ICD-10-CM

## 2014-06-11 NOTE — Progress Notes (Signed)
She presents today complaining of left knee pain. She's also complaining of painful elongated toenails one through 5 bilateral.  Objective: Nails are thick yellow dystrophic onychomycotic bilateral. Pulses are palpable bilateral.  Assessment: Pain in limb secondary to onychomycosis 1 through 5 bilateral.  Plan: Debridement of nails 1 through 5 bilateral.

## 2014-06-12 ENCOUNTER — Ambulatory Visit (INDEPENDENT_AMBULATORY_CARE_PROVIDER_SITE_OTHER): Payer: Medicare Other | Admitting: Internal Medicine

## 2014-06-12 ENCOUNTER — Encounter: Payer: Self-pay | Admitting: Internal Medicine

## 2014-06-12 VITALS — BP 106/54 | HR 63 | Temp 97.4°F | Wt 165.0 lb

## 2014-06-12 DIAGNOSIS — J441 Chronic obstructive pulmonary disease with (acute) exacerbation: Secondary | ICD-10-CM

## 2014-06-12 MED ORDER — LEVOFLOXACIN 500 MG PO TABS: 500.0000 mg | ORAL_TABLET | Freq: Every day | ORAL | Status: DC

## 2014-06-12 MED ORDER — PREDNISONE 10 MG PO TABS: ORAL_TABLET | ORAL | Status: DC

## 2014-06-12 NOTE — Progress Notes (Signed)
HPI  Pt presents to the clinic today with c/o cough, sore throat and ear pain. She reports this started 5 days ago. The cough is productive of yellow/green mucous. She has had some associated chest tightness and shortness of breath. She denies fevers but has had chills. She has tried Zyrtec and Flonase without any relief. She does have a history of allergies, COPD and CHF. She has not had sick contacts that she is aware of.  Review of Systems      Past Medical History  Diagnosis Date  . Allergic rhinitis, cause unspecified   . Unspecified cardiovascular disease   . Personal history of malignant neoplasm of breast   . Occlusion and stenosis of carotid artery without mention of cerebral infarction   . Chronic airway obstruction, not elsewhere classified   . Depressive disorder, not elsewhere classified   . Other dyspnea and respiratory abnormality   . Esophageal reflux   . Unspecified hearing loss   . Other and unspecified hyperlipidemia   . Unspecified essential hypertension   . Osteoporosis, unspecified   . Peripheral vascular disease, unspecified   . Unspecified urinary incontinence   . Spinal stenosis   . ACE-inhibitor cough   . Angina   . Heart murmur     aS CHILD  . Shortness of breath   . Recurrent upper respiratory infection (URI)   . Anxiety   . Pneumonia   . 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 Myocardial infarct   . Osteoarthrosis, unspecified whether generalized or localized, unspecified site   . Malignant neoplasm of breast (female), unspecified site   . Compression fracture of T12 vertebra 10/28/2011    Family History  Problem Relation Age of Onset  . Heart attack Father 72  . Cancer Mother     ovarian  . Cancer Brother     lung  . Arthritis      family    History   Social History  . Marital Status: Widowed    Spouse Name: N/A    Number of Children: 2  . Years of Education: N/A   Occupational History  . 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     Social History Main Topics  . Smoking status: Former Smoker    Quit date: 07/13/1974  . Smokeless tobacco: Never Used  . Alcohol Use: Yes     Comment: occasional  . Drug Use: No  . Sexual Activity: No   Other Topics Concern  . Not on file   Social History Narrative   Has living will   Son or daughter would be health care POA.   Requests DNR--written 03/02/13   No tube feeds if cognitively unaware    Allergies  Allergen Reactions  . Amoxicillin-Pot Clavulanate Anaphylaxis  . Cephalexin Other (See Comments)    Reaction unknown  . Clarithromycin Other (See Comments)    Reaction unknown  . Nifedipine Other (See Comments)    Reaction unknown  . Nsaids Other (See Comments)    Heart issue  . Olmesartan Medoxomil     REACTION: cough;  But tolerating Losartan 02/2014  . Tramadol Hcl Other (See Comments)    Reaction unknown     Constitutional: Positive headache, fatigue. Denies fever or abrupt weight changes.  HEENT:  Positive ear pain and sore throat. Denies eye redness, eye pain, pressure behind the eyes, facial pain, nasal congestion, ringing in the ears, wax buildup, runny nose or  bloody nose. Respiratory: Positive cough and shortness of breath. Denies difficulty breathing.  Cardiovascular: Pt reports chest tightness. Denies chest pain, chest tightness, palpitations or swelling in the hands or feet.   No other specific complaints in a complete review of systems (except as listed in HPI above).  Objective:   BP 106/54 mmHg  Pulse 63  Temp(Src) 97.4 F (36.3 C) (Oral)  Wt 165 lb (74.844 kg) Wt Readings from Last 3 Encounters:  06/12/14 165 lb (74.844 kg)  04/02/14 168 lb (76.204 kg)  03/22/14 172 lb 12.8 oz (78.382 kg)     General: Appears her stated age, in NAD. HEENT: Head: normal shape and size;  Ears: Tm's gray and intact, normal light reflex; Nose: mucosa pink and moist, septum midline; Throat/Mouth: Teeth present, mucosa  erythematous and moist, no exudate noted, no lesions or ulcerations noted.  Cardiovascular: Normal rate and rhythm. S1,S2 noted.  No murmur, rubs or gallops noted.  Pulmonary/Chest: Normal effort and scattered rhonchi in the RUL, RML with expiratory wheezing noted. No respiratory distress.      Assessment & Plan:   COPD exacerbation:  Get some rest and drink plenty of water Do salt water gargles for the sore throat eRx for Levaquin x 7  days eRx for Pred taper x 6 days  RTC as needed or if symptoms persist.

## 2014-06-12 NOTE — Patient Instructions (Signed)

## 2014-06-12 NOTE — Progress Notes (Signed)
Pre visit review using our clinic review tool, if applicable. No additional management support is needed unless otherwise documented below in the visit note. 

## 2014-06-16 ENCOUNTER — Other Ambulatory Visit: Payer: Self-pay | Admitting: Internal Medicine

## 2014-06-19 ENCOUNTER — Ambulatory Visit (INDEPENDENT_AMBULATORY_CARE_PROVIDER_SITE_OTHER): Payer: Medicare Other | Admitting: Internal Medicine

## 2014-06-19 ENCOUNTER — Encounter: Payer: Self-pay | Admitting: Internal Medicine

## 2014-06-19 VITALS — BP 120/70 | HR 61 | Temp 97.7°F | Wt 169.0 lb

## 2014-06-19 DIAGNOSIS — J440 Chronic obstructive pulmonary disease with acute lower respiratory infection: Secondary | ICD-10-CM

## 2014-06-19 DIAGNOSIS — J209 Acute bronchitis, unspecified: Secondary | ICD-10-CM | POA: Insufficient documentation

## 2014-06-19 MED ORDER — DOXYCYCLINE HYCLATE 100 MG PO CAPS: 100.0000 mg | ORAL_CAPSULE | Freq: Two times a day (BID) | ORAL | Status: DC

## 2014-06-19 NOTE — Progress Notes (Signed)
Pre visit review using our clinic review tool, if applicable. No additional management support is needed unless otherwise documented below in the visit note. 

## 2014-06-19 NOTE — Patient Instructions (Signed)
Please start the doxycycline antibiotic if you worsen or if you are not better by the end of the week.

## 2014-06-19 NOTE — Progress Notes (Signed)
Subjective:    Patient ID: Haley Harris, female    DOB: [birth year], 80 y.o.   MRN: [id number]  HPI Here with daughter for follow up of her COPD exacerbation Finished the medicine yesterday but still doesn't feel great Cough persists--- with colored sputum (now yellow instead of green) Head feels congested No fever SOB only during coughing fits--- not otherwise (so this is better)  Current Outpatient Prescriptions on File Prior to Visit  Medication Sig Dispense Refill  . aspirin 81 MG tablet Take 81 mg by mouth daily after lunch.     . Calcium Carbonate-Vitamin D (CALTRATE 600+D PO) Take 1 tablet by mouth daily after lunch.     . carvedilol (COREG) 12.5 MG tablet Take 1 tablet (12.5 mg total) by mouth 2 (two) times daily with a meal. 180 tablet 1  . cetirizine (ZYRTEC) 10 MG tablet Take 10 mg by mouth daily. For allergies    . clopidogrel (PLAVIX) 75 MG tablet Take 1 tablet (75 mg total) by mouth daily with breakfast. 30 tablet 1  . denosumab (PROLIA) 60 MG/ML SOLN injection Inject 60 mg into the skin every 6 (six) months. Administer in upper arm, thigh, or abdomen    . escitalopram (LEXAPRO) 10 MG tablet Take 1 tablet (10 mg total) by mouth daily. 90 tablet 0  . famotidine (PEPCID) 20 MG tablet Take 20 mg by mouth 2 (two) times daily.     . fluticasone (FLONASE) 50 MCG/ACT nasal spray USE 2 SPRAYS IN EACH NOSTRIL EVERY DAY AS DIRECTED 16 g 11  . furosemide (LASIX) 40 MG tablet Take 1 tablet (40 mg total) by mouth daily. 90 tablet 1  . gabapentin (NEURONTIN) 100 MG capsule Take 100 mg by mouth 2 (two) times daily.    Marland Kitchen losartan (COZAAR) 50 MG tablet TAKE 1 TABLET BY MOUTH DAILY 90 tablet 1  . montelukast (SINGULAIR) 10 MG tablet TAKE 1 TABLET BY MOUTH DAILY 90 tablet 1  . Multiple Vitamin (MULITIVITAMIN WITH MINERALS) TABS Take 1 tablet by mouth daily after lunch.     . oxyCODONE-acetaminophen (PERCOCET) 5-325 MG per tablet Take 2 tablets by mouth every 4 (four) hours as needed for pain.      . pravastatin (PRAVACHOL) 80 MG tablet Take 80 mg by mouth daily.    Marland Kitchen spironolactone (ALDACTONE) 25 MG tablet Take 25 mg by mouth daily.    . vitamin B-12 (CYANOCOBALAMIN) 500 MCG tablet Take 1,000 mcg by mouth 3 (three) times a week. Mon, Wed, Fri     No current facility-administered medications on file prior to visit.    Allergies  Allergen Reactions  . Amoxicillin-Pot Clavulanate Anaphylaxis  . Cephalexin Other (See Comments)    Reaction unknown  . Clarithromycin Other (See Comments)    Reaction unknown  . Nifedipine Other (See Comments)    Reaction unknown  . Nsaids Other (See Comments)    Heart issue  . Olmesartan Medoxomil     REACTION: cough;  But tolerating Losartan 02/2014  . Tramadol Hcl Other (See Comments)    Reaction unknown    Past Medical History  Diagnosis Date  . Allergic rhinitis, cause unspecified   . Unspecified cardiovascular disease   . Personal history of malignant neoplasm of breast   . Occlusion and stenosis of carotid artery without mention of cerebral infarction   . Chronic airway obstruction, not elsewhere classified   . Depressive disorder, not elsewhere classified   . Other dyspnea and respiratory abnormality   .  Esophageal reflux   . Unspecified hearing loss   . Other and unspecified hyperlipidemia   . Unspecified essential hypertension   . Osteoporosis, unspecified   . Peripheral vascular disease, unspecified   . Unspecified urinary incontinence   . Spinal stenosis   . ACE-inhibitor cough   . Angina   . Heart murmur     aS CHILD  . Shortness of breath   . Recurrent upper respiratory infection (URI)   . Anxiety   . Pneumonia   . 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 Myocardial infarct   . Osteoarthrosis, unspecified whether generalized or localized, unspecified site   . Malignant neoplasm of breast (female), unspecified site   . Compression fracture of T12 vertebra 10/28/2011    Past Surgical History    Procedure Laterality Date  . Mastectomy  1987    Right  . Breast reconstruction  1998    Reconstruction   . Reduction mammaplasty  1998    Left  . Mastoidectomy  childhood  . Appendectomy  1948  . Lumbar laminectomy  2003  . Shoulder surgery  02/2005    Bilateral fractures with multiple surgeries  . Breast implant removal  06/12/09    right  . Abdominal hysterectomy    . Fracture surgery      fracture right elbow  . Coronary angioplasty  11/12    distal RCA  . Mastectomy      Family History  Problem Relation Age of Onset  . Heart attack Father 60  . Cancer Mother     ovarian  . Cancer Brother     lung  . Arthritis      family    History   Social History  . Marital Status: Widowed    Spouse Name: N/A    Number of Children: 2  . Years of Education: N/A   Occupational History  . 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     Social History Main Topics  . Smoking status: Former Smoker    Quit date: 07/13/1974  . Smokeless tobacco: Never Used  . Alcohol Use: Yes     Comment: occasional  . Drug Use: No  . Sexual Activity: No   Other Topics Concern  . Not on file   Social History Narrative   Has living will   Son or daughter would be health care POA.   Requests DNR--written 03/02/13   No tube feeds if cognitively unaware   Review of Systems Some diarrhea --- relates to the antibiotic Appetite is okay but not great---taste has still not returned    Objective:   Physical Exam  Constitutional: She appears well-developed and well-nourished. No distress.  Neck: Normal range of motion. Neck supple. No thyromegaly present.  Cardiovascular: Normal rate, regular rhythm and normal heart sounds.  Exam reveals no gallop.   No murmur heard. Pulmonary/Chest: Effort normal. No respiratory distress. She has no wheezes. She has no rales.  Coarse cough--sounds like mucus there Fair breath sounds Rare scattered rhonchi  Lymphadenopathy:    She has no cervical  adenopathy.          Assessment & Plan:

## 2014-06-19 NOTE — Assessment & Plan Note (Signed)
Better No more prednisone needed

## 2014-06-19 NOTE — Assessment & Plan Note (Signed)
Better from last week but not resolved All family also sick ?atypical infection  Will wait If worsens, start doxy

## 2014-06-20 ENCOUNTER — Encounter (HOSPITAL_COMMUNITY): Payer: Self-pay | Admitting: Cardiology

## 2014-06-21 ENCOUNTER — Encounter (HOSPITAL_COMMUNITY): Payer: Self-pay | Admitting: Cardiology

## 2014-07-23 ENCOUNTER — Other Ambulatory Visit: Payer: Self-pay | Admitting: *Deleted

## 2014-07-23 MED ORDER — GABAPENTIN 100 MG PO CAPS: 100.0000 mg | ORAL_CAPSULE | Freq: Two times a day (BID) | ORAL | Status: DC

## 2014-07-23 MED ORDER — CARVEDILOL 12.5 MG PO TABS
12.5000 mg | ORAL_TABLET | Freq: Two times a day (BID) | ORAL | Status: DC
Start: 2014-07-23 — End: 2015-09-13

## 2014-07-23 MED ORDER — ESCITALOPRAM OXALATE 10 MG PO TABS: 10.0000 mg | ORAL_TABLET | Freq: Every day | ORAL | Status: DC

## 2014-07-23 MED ORDER — FAMOTIDINE 20 MG PO TABS: 20.0000 mg | ORAL_TABLET | Freq: Two times a day (BID) | ORAL | Status: DC

## 2014-07-23 MED ORDER — SPIRONOLACTONE 25 MG PO TABS: 25.0000 mg | ORAL_TABLET | Freq: Every day | ORAL | Status: DC

## 2014-07-23 MED ORDER — FLUTICASONE PROPIONATE 50 MCG/ACT NA SUSP: 2.0000 | Freq: Every day | NASAL | Status: DC

## 2014-07-23 MED ORDER — CLOPIDOGREL BISULFATE 75 MG PO TABS: 75.0000 mg | ORAL_TABLET | Freq: Every day | ORAL | Status: DC

## 2014-07-23 MED ORDER — FUROSEMIDE 40 MG PO TABS: 40.0000 mg | ORAL_TABLET | Freq: Every day | ORAL | Status: DC

## 2014-07-26 ENCOUNTER — Other Ambulatory Visit: Payer: Self-pay | Admitting: *Deleted

## 2014-07-26 MED ORDER — LOSARTAN POTASSIUM 50 MG PO TABS: ORAL_TABLET | ORAL | Status: DC

## 2014-07-26 MED ORDER — MONTELUKAST SODIUM 10 MG PO TABS: ORAL_TABLET | ORAL | Status: DC

## 2014-07-26 NOTE — Telephone Encounter (Signed)
Check with her If she is still on it, refill for a year

## 2014-07-26 NOTE — Telephone Encounter (Signed)
Ok to fill? Looks like it was stopped 03/01/14 in the hospital

## 2014-07-27 MED ORDER — PRAVASTATIN SODIUM 80 MG PO TABS: 80.0000 mg | ORAL_TABLET | Freq: Every day | ORAL | Status: DC

## 2014-07-30 ENCOUNTER — Encounter: Payer: Self-pay | Admitting: Internal Medicine

## 2014-07-30 ENCOUNTER — Ambulatory Visit (INDEPENDENT_AMBULATORY_CARE_PROVIDER_SITE_OTHER): Payer: Medicare Other | Admitting: Internal Medicine

## 2014-07-30 VITALS — BP 122/70 | HR 56 | Temp 98.2°F | Wt 171.8 lb

## 2014-07-30 DIAGNOSIS — I5042 Chronic combined systolic (congestive) and diastolic (congestive) heart failure: Secondary | ICD-10-CM | POA: Diagnosis not present

## 2014-07-30 DIAGNOSIS — J440 Chronic obstructive pulmonary disease with acute lower respiratory infection: Secondary | ICD-10-CM

## 2014-07-30 DIAGNOSIS — J209 Acute bronchitis, unspecified: Secondary | ICD-10-CM | POA: Diagnosis not present

## 2014-07-30 MED ORDER — PREDNISONE 20 MG PO TABS: 40.0000 mg | ORAL_TABLET | Freq: Every day | ORAL | Status: DC

## 2014-07-30 MED ORDER — DOXYCYCLINE HYCLATE 100 MG PO TABS: 100.0000 mg | ORAL_TABLET | Freq: Two times a day (BID) | ORAL | Status: DC

## 2014-07-30 MED ORDER — ALBUTEROL SULFATE (2.5 MG/3ML) 0.083% IN NEBU: 2.5000 mg | INHALATION_SOLUTION | Freq: Four times a day (QID) | RESPIRATORY_TRACT | Status: DC | PRN

## 2014-07-30 NOTE — Assessment & Plan Note (Signed)
Did respond to doxy but now worse again Will try this again Some element of sinusitis also--- no other great options for Rx due to allergies

## 2014-07-30 NOTE — Assessment & Plan Note (Signed)
Mild exacerbation Will give short course of prednisone Refill albuterol for nebulizer that she has

## 2014-07-30 NOTE — Progress Notes (Signed)
Pre visit review using our clinic review tool, if applicable. No additional management support is needed unless otherwise documented below in the visit note. 

## 2014-07-30 NOTE — Patient Instructions (Signed)
Please take a second dose of furosemide (about 4 hours after the first dose)---for the next 3 days.

## 2014-07-30 NOTE — Assessment & Plan Note (Signed)
Daughter concerned about exacerbation--- doesn't usually gain weight or have edema Discussed trying 2 furosemide for the next 3 days

## 2014-07-30 NOTE — Progress Notes (Signed)
Subjective:    Patient ID: Haley Harris, female    DOB: [birth year], 81 y.o.   MRN: [id number]  HPI Here with daughter due to ongoing cough Still sick since last month Did take the doxy and had brief improvement  Now worse again in past few days Congestion in head and ears (mostly left) Cough---productive in AM and then dry mucinex may have helped somewhat  No fever Some SOB and wheezing in throat--mostly with activity (but daughter indicating it is at rest also) No energy Some chills but no night sweats No sore throat Does not PND  Current Outpatient Prescriptions on File Prior to Visit  Medication Sig Dispense Refill  . aspirin 81 MG tablet Take 81 mg by mouth daily after lunch.     . Calcium Carbonate-Vitamin D (CALTRATE 600+D PO) Take 1 tablet by mouth daily after lunch.     . carvedilol (COREG) 12.5 MG tablet Take 1 tablet (12.5 mg total) by mouth 2 (two) times daily with a meal. 180 tablet 3  . cetirizine (ZYRTEC) 10 MG tablet Take 10 mg by mouth daily. For allergies    . clopidogrel (PLAVIX) 75 MG tablet Take 1 tablet (75 mg total) by mouth daily with breakfast. 90 tablet 3  . denosumab (PROLIA) 60 MG/ML SOLN injection Inject 60 mg into the skin every 6 (six) months. Administer in upper arm, thigh, or abdomen    . escitalopram (LEXAPRO) 10 MG tablet Take 1 tablet (10 mg total) by mouth daily. 90 tablet 0  . famotidine (PEPCID) 20 MG tablet Take 1 tablet (20 mg total) by mouth 2 (two) times daily. 180 tablet 3  . fluticasone (FLONASE) 50 MCG/ACT nasal spray Place 2 sprays into both nostrils daily. 48 g 3  . furosemide (LASIX) 40 MG tablet Take 1 tablet (40 mg total) by mouth daily. 90 tablet 3  . gabapentin (NEURONTIN) 100 MG capsule Take 1 capsule (100 mg total) by mouth 2 (two) times daily. 180 capsule 3  . losartan (COZAAR) 50 MG tablet TAKE 1 TABLET BY MOUTH DAILY 90 tablet 3  . montelukast (SINGULAIR) 10 MG tablet TAKE 1 TABLET BY MOUTH DAILY 90 tablet 3  . Multiple  Vitamin (MULITIVITAMIN WITH MINERALS) TABS Take 1 tablet by mouth daily after lunch.     . oxyCODONE-acetaminophen (PERCOCET) 5-325 MG per tablet Take 2 tablets by mouth every 4 (four) hours as needed for pain.     . pravastatin (PRAVACHOL) 80 MG tablet Take 1 tablet (80 mg total) by mouth daily. 30 tablet 3  . spironolactone (ALDACTONE) 25 MG tablet Take 1 tablet (25 mg total) by mouth daily. 90 tablet 3  . vitamin B-12 (CYANOCOBALAMIN) 500 MCG tablet Take 1,000 mcg by mouth 3 (three) times a week. Mon, Wed, Fri     No current facility-administered medications on file prior to visit.    Allergies  Allergen Reactions  . Amoxicillin-Pot Clavulanate Anaphylaxis  . Cephalexin Other (See Comments)    Reaction unknown  . Clarithromycin Other (See Comments)    Reaction unknown  . Nifedipine Other (See Comments)    Reaction unknown  . Nsaids Other (See Comments)    Heart issue  . Olmesartan Medoxomil     REACTION: cough;  But tolerating Losartan 02/2014  . Tramadol Hcl Other (See Comments)    Reaction unknown    Past Medical History  Diagnosis Date  . Allergic rhinitis, cause unspecified   . Unspecified cardiovascular disease   . Personal history  of malignant neoplasm of breast   . Occlusion and stenosis of carotid artery without mention of cerebral infarction   . Chronic airway obstruction, not elsewhere classified   . Depressive disorder, not elsewhere classified   . Other dyspnea and respiratory abnormality   . Esophageal reflux   . Unspecified hearing loss   . Other and unspecified hyperlipidemia   . Unspecified essential hypertension   . Osteoporosis, unspecified   . Peripheral vascular disease, unspecified   . Unspecified urinary incontinence   . Spinal stenosis   . ACE-inhibitor cough   . Angina   . Heart murmur     aS CHILD  . Shortness of breath   . Recurrent upper respiratory infection (URI)   . Anxiety   . Pneumonia   . Neuromuscular disorder     NEROPATHY FROM  STENOSIS  . Myocardial infarct   . Osteoarthrosis, unspecified whether generalized or localized, unspecified site   . Malignant neoplasm of breast (female), unspecified site   . Compression fracture of T12 vertebra 10/28/2011    Past Surgical History  Procedure Laterality Date  . Mastectomy  1987    Right  . Breast reconstruction  1998    Reconstruction   . Reduction mammaplasty  1998    Left  . Mastoidectomy  childhood  . Appendectomy  1948  . Lumbar laminectomy  2003  . Shoulder surgery  02/2005    Bilateral fractures with multiple surgeries  . Breast implant removal  06/12/09    right  . Abdominal hysterectomy    . Fracture surgery      fracture right elbow  . Coronary angioplasty  11/12    distal RCA  . Mastectomy    . Left heart catheterization with coronary angiogram N/A 06/05/2011    Procedure: LEFT HEART CATHETERIZATION WITH CORONARY ANGIOGRAM;  Surgeon: Clent Demark, MD;  Location: [facility] CATH LAB;  Service: Cardiovascular;  Laterality: N/A;  . Left heart catheterization with coronary angiogram N/A 03/05/2014    Procedure: LEFT HEART CATHETERIZATION WITH CORONARY ANGIOGRAM;  Surgeon: Clent Demark, MD;  Location: [facility] CATH LAB;  Service: Cardiovascular;  Laterality: N/A;    Family History  Problem Relation Age of Onset  . Heart attack Father 41  . Cancer Mother     ovarian  . Cancer Brother     lung  . Arthritis      family    History   Social History  . Marital Status: Widowed    Spouse Name: N/A    Number of Children: 2  . Years of Education: N/A   Occupational History  . 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     Social History Main Topics  . Smoking status: Former Smoker    Quit date: 07/13/1974  . Smokeless tobacco: Never Used  . Alcohol Use: Yes     Comment: occasional  . Drug Use: No  . Sexual Activity: No   Other Topics Concern  . Not on file   Social History Narrative   Has living will   Son or daughter would be health care  POA.   Requests DNR--written 03/02/13   No tube feeds if cognitively unaware   Review of Systems No rash Loose frequent stools (3) today---yesterday normal No vomiting Appetite off a bit---no taste now with this illness    Objective:   Physical Exam  Constitutional: She appears well-developed and well-nourished.  Intermittent coarse cough  HENT:  Mouth/Throat: Oropharynx  is clear and moist. No oropharyngeal exudate.  Mild maxillary and frontal tenderness TMs normal Mild nasal inflammation  Neck: Normal range of motion. Neck supple.  Pulmonary/Chest: Effort normal. No respiratory distress. She has no wheezes. She has no rales.  Seems slightly tight with slight increased expiratory phase  Lymphadenopathy:    She has no cervical adenopathy.          Assessment & Plan:

## 2014-08-10 DIAGNOSIS — I2510 Atherosclerotic heart disease of native coronary artery without angina pectoris: Secondary | ICD-10-CM | POA: Diagnosis not present

## 2014-08-10 DIAGNOSIS — I252 Old myocardial infarction: Secondary | ICD-10-CM | POA: Diagnosis not present

## 2014-08-10 DIAGNOSIS — I10 Essential (primary) hypertension: Secondary | ICD-10-CM | POA: Diagnosis not present

## 2014-08-10 DIAGNOSIS — I5020 Unspecified systolic (congestive) heart failure: Secondary | ICD-10-CM | POA: Diagnosis not present

## 2014-08-10 DIAGNOSIS — I255 Ischemic cardiomyopathy: Secondary | ICD-10-CM | POA: Diagnosis not present

## 2014-08-14 ENCOUNTER — Ambulatory Visit (INDEPENDENT_AMBULATORY_CARE_PROVIDER_SITE_OTHER)
Admission: RE | Admit: 2014-08-14 | Discharge: 2014-08-14 | Disposition: A | Discharge status: Home / Self Care | Payer: Medicare Other | Source: Ambulatory Visit | Attending: Internal Medicine | Admitting: Internal Medicine

## 2014-08-14 ENCOUNTER — Encounter: Payer: Self-pay | Admitting: Internal Medicine

## 2014-08-14 ENCOUNTER — Ambulatory Visit (INDEPENDENT_AMBULATORY_CARE_PROVIDER_SITE_OTHER): Payer: Medicare Other | Admitting: Internal Medicine

## 2014-08-14 VITALS — BP 140/78 | HR 76 | Temp 98.8°F | Wt 175.2 lb

## 2014-08-14 DIAGNOSIS — M79671 Pain in right foot: Secondary | ICD-10-CM

## 2014-08-14 DIAGNOSIS — M7989 Other specified soft tissue disorders: Secondary | ICD-10-CM | POA: Diagnosis not present

## 2014-08-14 DIAGNOSIS — S99921A Unspecified injury of right foot, initial encounter: Secondary | ICD-10-CM | POA: Diagnosis not present

## 2014-08-14 DIAGNOSIS — S99911A Unspecified injury of right ankle, initial encounter: Secondary | ICD-10-CM | POA: Diagnosis not present

## 2014-08-14 NOTE — Progress Notes (Signed)
Subjective:    Patient ID: Haley Harris, female    DOB: 01-21-1929, 81 y.o.   MRN: [id number]  HPI Here for ankle pain  Was sitting and not paying attention Got up to stand 2 days ago and everted right ankle Fell back onto bed--not floor Sat back down and called to daughter---did ice it quickly Was in a lot of pain--went out for granddaughter's birthday Putting weight on it but very painful  Taking left over percocet--- this has helped Tried ACE bandage for support Trying to stay off it and keep it elevated Intermittent ice since the incident  Current Outpatient Prescriptions on File Prior to Visit  Medication Sig Dispense Refill  . albuterol (PROVENTIL) (2.5 MG/3ML) 0.083% nebulizer solution Take 3 mLs (2.5 mg total) by nebulization every 6 (six) hours as needed for wheezing or shortness of breath. 75 mL 3  . aspirin 81 MG tablet Take 81 mg by mouth daily after lunch.     . Calcium Carbonate-Vitamin D (CALTRATE 600+D PO) Take 1 tablet by mouth daily after lunch.     . carvedilol (COREG) 12.5 MG tablet Take 1 tablet (12.5 mg total) by mouth 2 (two) times daily with a meal. 180 tablet 3  . cetirizine (ZYRTEC) 10 MG tablet Take 10 mg by mouth daily. For allergies    . clopidogrel (PLAVIX) 75 MG tablet Take 1 tablet (75 mg total) by mouth daily with breakfast. 90 tablet 3  . denosumab (PROLIA) 60 MG/ML SOLN injection Inject 60 mg into the skin every 6 (six) months. Administer in upper arm, thigh, or abdomen    . escitalopram (LEXAPRO) 10 MG tablet Take 1 tablet (10 mg total) by mouth daily. 90 tablet 0  . famotidine (PEPCID) 20 MG tablet Take 1 tablet (20 mg total) by mouth 2 (two) times daily. 180 tablet 3  . fluticasone (FLONASE) 50 MCG/ACT nasal spray Place 2 sprays into both nostrils daily. 48 g 3  . furosemide (LASIX) 40 MG tablet Take 1 tablet (40 mg total) by mouth daily. 90 tablet 3  . gabapentin (NEURONTIN) 100 MG capsule Take 1 capsule (100 mg total) by mouth 2 (two) times  daily. 180 capsule 3  . losartan (COZAAR) 50 MG tablet TAKE 1 TABLET BY MOUTH DAILY 90 tablet 3  . montelukast (SINGULAIR) 10 MG tablet TAKE 1 TABLET BY MOUTH DAILY 90 tablet 3  . Multiple Vitamin (MULITIVITAMIN WITH MINERALS) TABS Take 1 tablet by mouth daily after lunch.     . oxyCODONE-acetaminophen (PERCOCET) 5-325 MG per tablet Take 2 tablets by mouth every 4 (four) hours as needed for pain.     . pravastatin (PRAVACHOL) 80 MG tablet Take 1 tablet (80 mg total) by mouth daily. 30 tablet 3  . spironolactone (ALDACTONE) 25 MG tablet Take 1 tablet (25 mg total) by mouth daily. 90 tablet 3  . vitamin B-12 (CYANOCOBALAMIN) 500 MCG tablet Take 1,000 mcg by mouth 3 (three) times a week. Mon, Wed, Fri    . predniSONE (DELTASONE) 20 MG tablet Take 2 tablets (40 mg total) by mouth daily. (Patient not taking: Reported on 08/14/2014) 9 tablet 0   No current facility-administered medications on file prior to visit.    Allergies  Allergen Reactions  . Amoxicillin-Pot Clavulanate Anaphylaxis  . Cephalexin Other (See Comments)    Reaction unknown  . Clarithromycin Other (See Comments)    Reaction unknown  . Nifedipine Other (See Comments)    Reaction unknown  . Nsaids Other (See  Comments)    Heart issue  . Olmesartan Medoxomil     REACTION: cough;  But tolerating Losartan 02/2014  . Tramadol Hcl Other (See Comments)    Reaction unknown    Past Medical History  Diagnosis Date  . Allergic rhinitis, cause unspecified   . Unspecified cardiovascular disease   . Personal history of malignant neoplasm of breast   . Occlusion and stenosis of carotid artery without mention of cerebral infarction   . Chronic airway obstruction, not elsewhere classified   . Depressive disorder, not elsewhere classified   . Other dyspnea and respiratory abnormality   . Esophageal reflux   . Unspecified hearing loss   . Other and unspecified hyperlipidemia   . Unspecified essential hypertension   . Osteoporosis,  unspecified   . Peripheral vascular disease, unspecified   . Unspecified urinary incontinence   . Spinal stenosis   . ACE-inhibitor cough   . Angina   . Heart murmur     aS CHILD  . Shortness of breath   . Recurrent upper respiratory infection (URI)   . Anxiety   . Pneumonia   . 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 Myocardial infarct   . Osteoarthrosis, unspecified whether generalized or localized, unspecified site   . Malignant neoplasm of breast (female), unspecified site   . Compression fracture of T12 vertebra 10/28/2011    Past Surgical History  Procedure Laterality Date  . Mastectomy  1987    Right  . Breast reconstruction  1998    Reconstruction   . Reduction mammaplasty  1998    Left  . Mastoidectomy  childhood  . Appendectomy  1948  . Lumbar laminectomy  2003  . Shoulder surgery  02/2005    Bilateral fractures with multiple surgeries  . Breast implant removal  06/12/09    right  . Abdominal hysterectomy    . Fracture surgery      fracture right elbow  . Coronary angioplasty  11/12    distal RCA  . Mastectomy    . Left heart catheterization with coronary angiogram N/A 06/05/2011    Procedure: LEFT HEART CATHETERIZATION WITH CORONARY ANGIOGRAM;  Surgeon: Clent Demark, MD;  Location: [facility] CATH LAB;  Service: Cardiovascular;  Laterality: N/A;  . Left heart catheterization with coronary angiogram N/A 03/05/2014    Procedure: LEFT HEART CATHETERIZATION WITH CORONARY ANGIOGRAM;  Surgeon: Clent Demark, MD;  Location: [facility] CATH LAB;  Service: Cardiovascular;  Laterality: N/A;    Family History  Problem Relation Age of Onset  . Heart attack Father 34  . Cancer Mother     ovarian  . Cancer Brother     lung  . Arthritis      family    History   Social History  . Marital Status: Widowed    Spouse Name: N/A    Number of Children: 2  . Years of Education: N/A   Occupational History  . 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      Social History Main Topics  . Smoking status: Former Smoker    Quit date: 07/13/1974  . Smokeless tobacco: Never Used  . Alcohol Use: 0.0 oz/week    0 Not specified per week     Comment: occasional  . Drug Use: No  . Sexual Activity: No   Other Topics Concern  . Not on file   Social History Narrative   Has living will   Son  or daughter would be health care POA.   Requests DNR--written 03/02/13   No tube feeds if cognitively unaware   Review of Systems Respiratory illness resolved No fever    Objective:   Physical Exam  Constitutional: She appears well-developed and well-nourished. No distress.  Musculoskeletal:  Swelling in right foot and ankle 2nd toe is bruised and tender Some tenderness at MTP joints Malleolar tenderness and pain with passive ROM          Assessment & Plan:

## 2014-08-14 NOTE — Progress Notes (Signed)
Pre visit review using our clinic review tool, if applicable. No additional management support is needed unless otherwise documented below in the visit note. 

## 2014-08-14 NOTE — Assessment & Plan Note (Addendum)
Bad eversion injury and bruising in 2nd toe and pain all over foot and ankle X-ray shows possible medial malleolus avulsion fracture Will put her in a walking boot and try to limit any weight bearing Has an appt with Dr Louanne Skye on 2/10---she will try to push that up

## 2014-08-15 DIAGNOSIS — S92324A Nondisplaced fracture of second metatarsal bone, right foot, initial encounter for closed fracture: Secondary | ICD-10-CM | POA: Diagnosis not present

## 2014-08-15 DIAGNOSIS — M1712 Unilateral primary osteoarthritis, left knee: Secondary | ICD-10-CM | POA: Diagnosis not present

## 2014-08-15 DIAGNOSIS — S92354A Nondisplaced fracture of fifth metatarsal bone, right foot, initial encounter for closed fracture: Secondary | ICD-10-CM | POA: Diagnosis not present

## 2014-08-15 DIAGNOSIS — S92334A Nondisplaced fracture of third metatarsal bone, right foot, initial encounter for closed fracture: Secondary | ICD-10-CM | POA: Diagnosis not present

## 2014-08-15 DIAGNOSIS — S93431A Sprain of tibiofibular ligament of right ankle, initial encounter: Secondary | ICD-10-CM | POA: Diagnosis not present

## 2014-09-05 DIAGNOSIS — S93431D Sprain of tibiofibular ligament of right ankle, subsequent encounter: Secondary | ICD-10-CM | POA: Diagnosis not present

## 2014-09-05 DIAGNOSIS — S92324D Nondisplaced fracture of second metatarsal bone, right foot, subsequent encounter for fracture with routine healing: Secondary | ICD-10-CM | POA: Diagnosis not present

## 2014-09-11 DIAGNOSIS — J449 Chronic obstructive pulmonary disease, unspecified: Secondary | ICD-10-CM | POA: Diagnosis not present

## 2014-09-11 DIAGNOSIS — F329 Major depressive disorder, single episode, unspecified: Secondary | ICD-10-CM | POA: Diagnosis not present

## 2014-09-11 DIAGNOSIS — S92354D Nondisplaced fracture of fifth metatarsal bone, right foot, subsequent encounter for fracture with routine healing: Secondary | ICD-10-CM | POA: Diagnosis not present

## 2014-09-11 DIAGNOSIS — S92334D Nondisplaced fracture of third metatarsal bone, right foot, subsequent encounter for fracture with routine healing: Secondary | ICD-10-CM | POA: Diagnosis not present

## 2014-09-11 DIAGNOSIS — M179 Osteoarthritis of knee, unspecified: Secondary | ICD-10-CM | POA: Diagnosis not present

## 2014-09-11 DIAGNOSIS — Z7982 Long term (current) use of aspirin: Secondary | ICD-10-CM | POA: Diagnosis not present

## 2014-09-11 DIAGNOSIS — Z9181 History of falling: Secondary | ICD-10-CM | POA: Diagnosis not present

## 2014-09-11 DIAGNOSIS — M810 Age-related osteoporosis without current pathological fracture: Secondary | ICD-10-CM | POA: Diagnosis not present

## 2014-09-11 DIAGNOSIS — Z87891 Personal history of nicotine dependence: Secondary | ICD-10-CM | POA: Diagnosis not present

## 2014-09-11 DIAGNOSIS — S93401D Sprain of unspecified ligament of right ankle, subsequent encounter: Secondary | ICD-10-CM | POA: Diagnosis not present

## 2014-09-11 DIAGNOSIS — Z7902 Long term (current) use of antithrombotics/antiplatelets: Secondary | ICD-10-CM | POA: Diagnosis not present

## 2014-09-11 DIAGNOSIS — I5020 Unspecified systolic (congestive) heart failure: Secondary | ICD-10-CM | POA: Diagnosis not present

## 2014-09-11 DIAGNOSIS — I10 Essential (primary) hypertension: Secondary | ICD-10-CM | POA: Diagnosis not present

## 2014-09-11 DIAGNOSIS — S92324D Nondisplaced fracture of second metatarsal bone, right foot, subsequent encounter for fracture with routine healing: Secondary | ICD-10-CM | POA: Diagnosis not present

## 2014-09-11 DIAGNOSIS — Z853 Personal history of malignant neoplasm of breast: Secondary | ICD-10-CM | POA: Diagnosis not present

## 2014-09-11 DIAGNOSIS — K219 Gastro-esophageal reflux disease without esophagitis: Secondary | ICD-10-CM | POA: Diagnosis not present

## 2014-09-11 DIAGNOSIS — G43909 Migraine, unspecified, not intractable, without status migrainosus: Secondary | ICD-10-CM | POA: Diagnosis not present

## 2014-09-12 ENCOUNTER — Other Ambulatory Visit: Payer: Medicare Other

## 2014-09-14 DIAGNOSIS — Z7902 Long term (current) use of antithrombotics/antiplatelets: Secondary | ICD-10-CM | POA: Diagnosis not present

## 2014-09-14 DIAGNOSIS — Z853 Personal history of malignant neoplasm of breast: Secondary | ICD-10-CM | POA: Diagnosis not present

## 2014-09-14 DIAGNOSIS — J449 Chronic obstructive pulmonary disease, unspecified: Secondary | ICD-10-CM | POA: Diagnosis not present

## 2014-09-14 DIAGNOSIS — S92324D Nondisplaced fracture of second metatarsal bone, right foot, subsequent encounter for fracture with routine healing: Secondary | ICD-10-CM | POA: Diagnosis not present

## 2014-09-14 DIAGNOSIS — Z87891 Personal history of nicotine dependence: Secondary | ICD-10-CM | POA: Diagnosis not present

## 2014-09-14 DIAGNOSIS — I10 Essential (primary) hypertension: Secondary | ICD-10-CM | POA: Diagnosis not present

## 2014-09-14 DIAGNOSIS — G43909 Migraine, unspecified, not intractable, without status migrainosus: Secondary | ICD-10-CM | POA: Diagnosis not present

## 2014-09-14 DIAGNOSIS — I5020 Unspecified systolic (congestive) heart failure: Secondary | ICD-10-CM | POA: Diagnosis not present

## 2014-09-14 DIAGNOSIS — S92354D Nondisplaced fracture of fifth metatarsal bone, right foot, subsequent encounter for fracture with routine healing: Secondary | ICD-10-CM | POA: Diagnosis not present

## 2014-09-14 DIAGNOSIS — Z9181 History of falling: Secondary | ICD-10-CM | POA: Diagnosis not present

## 2014-09-14 DIAGNOSIS — Z7982 Long term (current) use of aspirin: Secondary | ICD-10-CM | POA: Diagnosis not present

## 2014-09-14 DIAGNOSIS — F329 Major depressive disorder, single episode, unspecified: Secondary | ICD-10-CM | POA: Diagnosis not present

## 2014-09-14 DIAGNOSIS — K219 Gastro-esophageal reflux disease without esophagitis: Secondary | ICD-10-CM | POA: Diagnosis not present

## 2014-09-14 DIAGNOSIS — M179 Osteoarthritis of knee, unspecified: Secondary | ICD-10-CM | POA: Diagnosis not present

## 2014-09-14 DIAGNOSIS — S93401D Sprain of unspecified ligament of right ankle, subsequent encounter: Secondary | ICD-10-CM | POA: Diagnosis not present

## 2014-09-14 DIAGNOSIS — S92334D Nondisplaced fracture of third metatarsal bone, right foot, subsequent encounter for fracture with routine healing: Secondary | ICD-10-CM | POA: Diagnosis not present

## 2014-09-14 DIAGNOSIS — M810 Age-related osteoporosis without current pathological fracture: Secondary | ICD-10-CM | POA: Diagnosis not present

## 2014-09-18 DIAGNOSIS — J449 Chronic obstructive pulmonary disease, unspecified: Secondary | ICD-10-CM | POA: Diagnosis not present

## 2014-09-18 DIAGNOSIS — S92334D Nondisplaced fracture of third metatarsal bone, right foot, subsequent encounter for fracture with routine healing: Secondary | ICD-10-CM | POA: Diagnosis not present

## 2014-09-18 DIAGNOSIS — Z7982 Long term (current) use of aspirin: Secondary | ICD-10-CM | POA: Diagnosis not present

## 2014-09-18 DIAGNOSIS — M179 Osteoarthritis of knee, unspecified: Secondary | ICD-10-CM | POA: Diagnosis not present

## 2014-09-18 DIAGNOSIS — Z853 Personal history of malignant neoplasm of breast: Secondary | ICD-10-CM | POA: Diagnosis not present

## 2014-09-18 DIAGNOSIS — G43909 Migraine, unspecified, not intractable, without status migrainosus: Secondary | ICD-10-CM | POA: Diagnosis not present

## 2014-09-18 DIAGNOSIS — S93401D Sprain of unspecified ligament of right ankle, subsequent encounter: Secondary | ICD-10-CM | POA: Diagnosis not present

## 2014-09-18 DIAGNOSIS — I10 Essential (primary) hypertension: Secondary | ICD-10-CM | POA: Diagnosis not present

## 2014-09-18 DIAGNOSIS — S92324D Nondisplaced fracture of second metatarsal bone, right foot, subsequent encounter for fracture with routine healing: Secondary | ICD-10-CM | POA: Diagnosis not present

## 2014-09-18 DIAGNOSIS — Z87891 Personal history of nicotine dependence: Secondary | ICD-10-CM | POA: Diagnosis not present

## 2014-09-18 DIAGNOSIS — F329 Major depressive disorder, single episode, unspecified: Secondary | ICD-10-CM | POA: Diagnosis not present

## 2014-09-18 DIAGNOSIS — Z7902 Long term (current) use of antithrombotics/antiplatelets: Secondary | ICD-10-CM | POA: Diagnosis not present

## 2014-09-18 DIAGNOSIS — I5020 Unspecified systolic (congestive) heart failure: Secondary | ICD-10-CM | POA: Diagnosis not present

## 2014-09-18 DIAGNOSIS — Z9181 History of falling: Secondary | ICD-10-CM | POA: Diagnosis not present

## 2014-09-18 DIAGNOSIS — S92354D Nondisplaced fracture of fifth metatarsal bone, right foot, subsequent encounter for fracture with routine healing: Secondary | ICD-10-CM | POA: Diagnosis not present

## 2014-09-18 DIAGNOSIS — M810 Age-related osteoporosis without current pathological fracture: Secondary | ICD-10-CM | POA: Diagnosis not present

## 2014-09-18 DIAGNOSIS — K219 Gastro-esophageal reflux disease without esophagitis: Secondary | ICD-10-CM | POA: Diagnosis not present

## 2014-09-21 DIAGNOSIS — S92334D Nondisplaced fracture of third metatarsal bone, right foot, subsequent encounter for fracture with routine healing: Secondary | ICD-10-CM | POA: Diagnosis not present

## 2014-09-21 DIAGNOSIS — S93401D Sprain of unspecified ligament of right ankle, subsequent encounter: Secondary | ICD-10-CM | POA: Diagnosis not present

## 2014-09-21 DIAGNOSIS — Z853 Personal history of malignant neoplasm of breast: Secondary | ICD-10-CM | POA: Diagnosis not present

## 2014-09-21 DIAGNOSIS — S92324D Nondisplaced fracture of second metatarsal bone, right foot, subsequent encounter for fracture with routine healing: Secondary | ICD-10-CM | POA: Diagnosis not present

## 2014-09-21 DIAGNOSIS — Z9181 History of falling: Secondary | ICD-10-CM | POA: Diagnosis not present

## 2014-09-21 DIAGNOSIS — G43909 Migraine, unspecified, not intractable, without status migrainosus: Secondary | ICD-10-CM | POA: Diagnosis not present

## 2014-09-21 DIAGNOSIS — S92354D Nondisplaced fracture of fifth metatarsal bone, right foot, subsequent encounter for fracture with routine healing: Secondary | ICD-10-CM | POA: Diagnosis not present

## 2014-09-21 DIAGNOSIS — Z87891 Personal history of nicotine dependence: Secondary | ICD-10-CM | POA: Diagnosis not present

## 2014-09-21 DIAGNOSIS — F329 Major depressive disorder, single episode, unspecified: Secondary | ICD-10-CM | POA: Diagnosis not present

## 2014-09-21 DIAGNOSIS — M179 Osteoarthritis of knee, unspecified: Secondary | ICD-10-CM | POA: Diagnosis not present

## 2014-09-21 DIAGNOSIS — I5020 Unspecified systolic (congestive) heart failure: Secondary | ICD-10-CM | POA: Diagnosis not present

## 2014-09-21 DIAGNOSIS — Z7902 Long term (current) use of antithrombotics/antiplatelets: Secondary | ICD-10-CM | POA: Diagnosis not present

## 2014-09-21 DIAGNOSIS — K219 Gastro-esophageal reflux disease without esophagitis: Secondary | ICD-10-CM | POA: Diagnosis not present

## 2014-09-21 DIAGNOSIS — J449 Chronic obstructive pulmonary disease, unspecified: Secondary | ICD-10-CM | POA: Diagnosis not present

## 2014-09-21 DIAGNOSIS — I10 Essential (primary) hypertension: Secondary | ICD-10-CM | POA: Diagnosis not present

## 2014-09-21 DIAGNOSIS — Z7982 Long term (current) use of aspirin: Secondary | ICD-10-CM | POA: Diagnosis not present

## 2014-09-21 DIAGNOSIS — M810 Age-related osteoporosis without current pathological fracture: Secondary | ICD-10-CM | POA: Diagnosis not present

## 2014-09-25 DIAGNOSIS — F329 Major depressive disorder, single episode, unspecified: Secondary | ICD-10-CM | POA: Diagnosis not present

## 2014-09-25 DIAGNOSIS — S93401D Sprain of unspecified ligament of right ankle, subsequent encounter: Secondary | ICD-10-CM | POA: Diagnosis not present

## 2014-09-25 DIAGNOSIS — Z87891 Personal history of nicotine dependence: Secondary | ICD-10-CM | POA: Diagnosis not present

## 2014-09-25 DIAGNOSIS — Z9181 History of falling: Secondary | ICD-10-CM | POA: Diagnosis not present

## 2014-09-25 DIAGNOSIS — M179 Osteoarthritis of knee, unspecified: Secondary | ICD-10-CM | POA: Diagnosis not present

## 2014-09-25 DIAGNOSIS — I10 Essential (primary) hypertension: Secondary | ICD-10-CM | POA: Diagnosis not present

## 2014-09-25 DIAGNOSIS — M810 Age-related osteoporosis without current pathological fracture: Secondary | ICD-10-CM | POA: Diagnosis not present

## 2014-09-25 DIAGNOSIS — J449 Chronic obstructive pulmonary disease, unspecified: Secondary | ICD-10-CM | POA: Diagnosis not present

## 2014-09-25 DIAGNOSIS — Z853 Personal history of malignant neoplasm of breast: Secondary | ICD-10-CM | POA: Diagnosis not present

## 2014-09-25 DIAGNOSIS — S92354D Nondisplaced fracture of fifth metatarsal bone, right foot, subsequent encounter for fracture with routine healing: Secondary | ICD-10-CM | POA: Diagnosis not present

## 2014-09-25 DIAGNOSIS — Z7902 Long term (current) use of antithrombotics/antiplatelets: Secondary | ICD-10-CM | POA: Diagnosis not present

## 2014-09-25 DIAGNOSIS — S92334D Nondisplaced fracture of third metatarsal bone, right foot, subsequent encounter for fracture with routine healing: Secondary | ICD-10-CM | POA: Diagnosis not present

## 2014-09-25 DIAGNOSIS — I5020 Unspecified systolic (congestive) heart failure: Secondary | ICD-10-CM | POA: Diagnosis not present

## 2014-09-25 DIAGNOSIS — G43909 Migraine, unspecified, not intractable, without status migrainosus: Secondary | ICD-10-CM | POA: Diagnosis not present

## 2014-09-25 DIAGNOSIS — K219 Gastro-esophageal reflux disease without esophagitis: Secondary | ICD-10-CM | POA: Diagnosis not present

## 2014-09-25 DIAGNOSIS — Z7982 Long term (current) use of aspirin: Secondary | ICD-10-CM | POA: Diagnosis not present

## 2014-09-25 DIAGNOSIS — S92324D Nondisplaced fracture of second metatarsal bone, right foot, subsequent encounter for fracture with routine healing: Secondary | ICD-10-CM | POA: Diagnosis not present

## 2014-09-28 DIAGNOSIS — K219 Gastro-esophageal reflux disease without esophagitis: Secondary | ICD-10-CM | POA: Diagnosis not present

## 2014-09-28 DIAGNOSIS — S92324D Nondisplaced fracture of second metatarsal bone, right foot, subsequent encounter for fracture with routine healing: Secondary | ICD-10-CM | POA: Diagnosis not present

## 2014-09-28 DIAGNOSIS — Z7982 Long term (current) use of aspirin: Secondary | ICD-10-CM | POA: Diagnosis not present

## 2014-09-28 DIAGNOSIS — M179 Osteoarthritis of knee, unspecified: Secondary | ICD-10-CM | POA: Diagnosis not present

## 2014-09-28 DIAGNOSIS — Z7902 Long term (current) use of antithrombotics/antiplatelets: Secondary | ICD-10-CM | POA: Diagnosis not present

## 2014-09-28 DIAGNOSIS — G43909 Migraine, unspecified, not intractable, without status migrainosus: Secondary | ICD-10-CM | POA: Diagnosis not present

## 2014-09-28 DIAGNOSIS — J449 Chronic obstructive pulmonary disease, unspecified: Secondary | ICD-10-CM | POA: Diagnosis not present

## 2014-09-28 DIAGNOSIS — S92334D Nondisplaced fracture of third metatarsal bone, right foot, subsequent encounter for fracture with routine healing: Secondary | ICD-10-CM | POA: Diagnosis not present

## 2014-09-28 DIAGNOSIS — Z853 Personal history of malignant neoplasm of breast: Secondary | ICD-10-CM | POA: Diagnosis not present

## 2014-09-28 DIAGNOSIS — S92354D Nondisplaced fracture of fifth metatarsal bone, right foot, subsequent encounter for fracture with routine healing: Secondary | ICD-10-CM | POA: Diagnosis not present

## 2014-09-28 DIAGNOSIS — S93401D Sprain of unspecified ligament of right ankle, subsequent encounter: Secondary | ICD-10-CM | POA: Diagnosis not present

## 2014-09-28 DIAGNOSIS — F329 Major depressive disorder, single episode, unspecified: Secondary | ICD-10-CM | POA: Diagnosis not present

## 2014-09-28 DIAGNOSIS — Z87891 Personal history of nicotine dependence: Secondary | ICD-10-CM | POA: Diagnosis not present

## 2014-09-28 DIAGNOSIS — I10 Essential (primary) hypertension: Secondary | ICD-10-CM | POA: Diagnosis not present

## 2014-09-28 DIAGNOSIS — Z9181 History of falling: Secondary | ICD-10-CM | POA: Diagnosis not present

## 2014-09-28 DIAGNOSIS — M810 Age-related osteoporosis without current pathological fracture: Secondary | ICD-10-CM | POA: Diagnosis not present

## 2014-09-28 DIAGNOSIS — I5020 Unspecified systolic (congestive) heart failure: Secondary | ICD-10-CM | POA: Diagnosis not present

## 2014-10-01 DIAGNOSIS — I10 Essential (primary) hypertension: Secondary | ICD-10-CM | POA: Diagnosis not present

## 2014-10-01 DIAGNOSIS — S92334D Nondisplaced fracture of third metatarsal bone, right foot, subsequent encounter for fracture with routine healing: Secondary | ICD-10-CM | POA: Diagnosis not present

## 2014-10-01 DIAGNOSIS — M810 Age-related osteoporosis without current pathological fracture: Secondary | ICD-10-CM | POA: Diagnosis not present

## 2014-10-01 DIAGNOSIS — I5020 Unspecified systolic (congestive) heart failure: Secondary | ICD-10-CM | POA: Diagnosis not present

## 2014-10-01 DIAGNOSIS — F329 Major depressive disorder, single episode, unspecified: Secondary | ICD-10-CM | POA: Diagnosis not present

## 2014-10-01 DIAGNOSIS — S92354D Nondisplaced fracture of fifth metatarsal bone, right foot, subsequent encounter for fracture with routine healing: Secondary | ICD-10-CM | POA: Diagnosis not present

## 2014-10-01 DIAGNOSIS — Z7982 Long term (current) use of aspirin: Secondary | ICD-10-CM | POA: Diagnosis not present

## 2014-10-01 DIAGNOSIS — Z7902 Long term (current) use of antithrombotics/antiplatelets: Secondary | ICD-10-CM | POA: Diagnosis not present

## 2014-10-01 DIAGNOSIS — Z9181 History of falling: Secondary | ICD-10-CM | POA: Diagnosis not present

## 2014-10-01 DIAGNOSIS — Z853 Personal history of malignant neoplasm of breast: Secondary | ICD-10-CM | POA: Diagnosis not present

## 2014-10-01 DIAGNOSIS — S93401D Sprain of unspecified ligament of right ankle, subsequent encounter: Secondary | ICD-10-CM | POA: Diagnosis not present

## 2014-10-01 DIAGNOSIS — S92324D Nondisplaced fracture of second metatarsal bone, right foot, subsequent encounter for fracture with routine healing: Secondary | ICD-10-CM | POA: Diagnosis not present

## 2014-10-01 DIAGNOSIS — M179 Osteoarthritis of knee, unspecified: Secondary | ICD-10-CM | POA: Diagnosis not present

## 2014-10-01 DIAGNOSIS — G43909 Migraine, unspecified, not intractable, without status migrainosus: Secondary | ICD-10-CM | POA: Diagnosis not present

## 2014-10-01 DIAGNOSIS — Z87891 Personal history of nicotine dependence: Secondary | ICD-10-CM | POA: Diagnosis not present

## 2014-10-01 DIAGNOSIS — J449 Chronic obstructive pulmonary disease, unspecified: Secondary | ICD-10-CM | POA: Diagnosis not present

## 2014-10-01 DIAGNOSIS — K219 Gastro-esophageal reflux disease without esophagitis: Secondary | ICD-10-CM | POA: Diagnosis not present

## 2014-10-02 ENCOUNTER — Encounter: Payer: Self-pay | Admitting: Internal Medicine

## 2014-10-02 ENCOUNTER — Ambulatory Visit (INDEPENDENT_AMBULATORY_CARE_PROVIDER_SITE_OTHER): Payer: Medicare Other | Admitting: Internal Medicine

## 2014-10-02 VITALS — BP 110/70 | HR 75 | Temp 97.9°F | Wt 173.0 lb

## 2014-10-02 DIAGNOSIS — J449 Chronic obstructive pulmonary disease, unspecified: Secondary | ICD-10-CM

## 2014-10-02 DIAGNOSIS — I5042 Chronic combined systolic (congestive) and diastolic (congestive) heart failure: Secondary | ICD-10-CM | POA: Diagnosis not present

## 2014-10-02 DIAGNOSIS — F39 Unspecified mood [affective] disorder: Secondary | ICD-10-CM | POA: Diagnosis not present

## 2014-10-02 DIAGNOSIS — J301 Allergic rhinitis due to pollen: Secondary | ICD-10-CM | POA: Diagnosis not present

## 2014-10-02 LAB — RENAL FUNCTION PANEL
Albumin: 3.8 g/dL (ref 3.5–5.2)
BUN: 39 mg/dL — ABNORMAL HIGH (ref 6–23)
CO2: 31 mEq/L (ref 19–32)
CREATININE: 1.35 mg/dL — AB (ref 0.40–1.20)
Calcium: 9.4 mg/dL (ref 8.4–10.5)
Chloride: 102 mEq/L (ref 96–112)
GFR: 39.75 mL/min — ABNORMAL LOW (ref >60.00)
GLUCOSE: 108 mg/dL — AB (ref 70–99)
Phosphorus: 4.4 mg/dL (ref 2.3–4.6)
Potassium: 4.8 mEq/L (ref 3.5–5.1)
SODIUM: 137 meq/L (ref 135–145)

## 2014-10-02 MED ORDER — ESCITALOPRAM OXALATE 10 MG PO TABS: 10.0000 mg | ORAL_TABLET | Freq: Every day | ORAL | Status: DC

## 2014-10-02 NOTE — Progress Notes (Signed)
Pre visit review using our clinic review tool, if applicable. No additional management support is needed unless otherwise documented below in the visit note. 

## 2014-10-02 NOTE — Assessment & Plan Note (Signed)
Has chronic dysthmia and episodic issues Doing well on the escitalopram

## 2014-10-02 NOTE — Assessment & Plan Note (Signed)
Compensated on current diuretic regimen Will check renal function She monitors weight and is careful with eating

## 2014-10-02 NOTE — Assessment & Plan Note (Signed)
Has been compensated Uses the nebs more in allergy season

## 2014-10-02 NOTE — Progress Notes (Signed)
Subjective:    Patient ID: Haley Harris, female    DOB: 1929-04-02, 81 y.o.   MRN: [id number]  HPI Here with daughter for follow up of multiple medical problems  Went to Dr Louanne Skye Now in full CAM boot for the past 6 weeks Working with PT and her stamina is improving Has follow up with Dr Louanne Skye in 2 days  Heart has been okay Feels any breathing issues are due to the tree pollen Taking the singulair, cetirzine and flonase--reasonable control with this Uses the albuterol nebs just about every morning this time of year Some cough but not bad  No chest pain No dizziness or syncope Monitoring weight--- has been stable No palpitations  Mood is good No problems tolerating the escitalopram  Current Outpatient Prescriptions on File Prior to Visit  Medication Sig Dispense Refill  . albuterol (PROVENTIL) (2.5 MG/3ML) 0.083% nebulizer solution Take 3 mLs (2.5 mg total) by nebulization every 6 (six) hours as needed for wheezing or shortness of breath. 75 mL 3  . aspirin 81 MG tablet Take 81 mg by mouth daily after lunch.     . Calcium Carbonate-Vitamin D (CALTRATE 600+D PO) Take 1 tablet by mouth daily after lunch.     . carvedilol (COREG) 12.5 MG tablet Take 1 tablet (12.5 mg total) by mouth 2 (two) times daily with a meal. 180 tablet 3  . cetirizine (ZYRTEC) 10 MG tablet Take 10 mg by mouth daily. For allergies    . clopidogrel (PLAVIX) 75 MG tablet Take 1 tablet (75 mg total) by mouth daily with breakfast. 90 tablet 3  . denosumab (PROLIA) 60 MG/ML SOLN injection Inject 60 mg into the skin every 6 (six) months. Administer in upper arm, thigh, or abdomen    . famotidine (PEPCID) 20 MG tablet Take 1 tablet (20 mg total) by mouth 2 (two) times daily. 180 tablet 3  . fluticasone (FLONASE) 50 MCG/ACT nasal spray Place 2 sprays into both nostrils daily. 48 g 3  . furosemide (LASIX) 40 MG tablet Take 1 tablet (40 mg total) by mouth daily. 90 tablet 3  . gabapentin (NEURONTIN) 100 MG capsule Take  1 capsule (100 mg total) by mouth 2 (two) times daily. 180 capsule 3  . losartan (COZAAR) 50 MG tablet TAKE 1 TABLET BY MOUTH DAILY 90 tablet 3  . montelukast (SINGULAIR) 10 MG tablet TAKE 1 TABLET BY MOUTH DAILY 90 tablet 3  . Multiple Vitamin (MULITIVITAMIN WITH MINERALS) TABS Take 1 tablet by mouth daily after lunch.     . oxyCODONE-acetaminophen (PERCOCET) 5-325 MG per tablet Take 2 tablets by mouth every 4 (four) hours as needed for pain.     . pravastatin (PRAVACHOL) 80 MG tablet Take 1 tablet (80 mg total) by mouth daily. 30 tablet 3  . spironolactone (ALDACTONE) 25 MG tablet Take 1 tablet (25 mg total) by mouth daily. 90 tablet 3  . vitamin B-12 (CYANOCOBALAMIN) 500 MCG tablet Take 1,000 mcg by mouth 3 (three) times a week. Mon, Wed, Fri     No current facility-administered medications on file prior to visit.    Allergies  Allergen Reactions  . Amoxicillin-Pot Clavulanate Anaphylaxis  . Cephalexin Other (See Comments)    Reaction unknown  . Clarithromycin Other (See Comments)    Reaction unknown  . Nifedipine Other (See Comments)    Reaction unknown  . Nsaids Other (See Comments)    Heart issue  . Olmesartan Medoxomil     REACTION: cough;  But  tolerating Losartan 02/2014  . Tramadol Hcl Other (See Comments)    Reaction unknown    Past Medical History  Diagnosis Date  . Allergic rhinitis, cause unspecified   . Unspecified cardiovascular disease   . Personal history of malignant neoplasm of breast   . Occlusion and stenosis of carotid artery without mention of cerebral infarction   . Chronic airway obstruction, not elsewhere classified   . Depressive disorder, not elsewhere classified   . Other dyspnea and respiratory abnormality   . Esophageal reflux   . Unspecified hearing loss   . Other and unspecified hyperlipidemia   . Unspecified essential hypertension   . Osteoporosis, unspecified   . Peripheral vascular disease, unspecified   . Unspecified urinary incontinence    . Spinal stenosis   . ACE-inhibitor cough   . Angina   . Heart murmur     aS CHILD  . Shortness of breath   . Recurrent upper respiratory infection (URI)   . Anxiety   . Pneumonia   . 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 Myocardial infarct   . Osteoarthrosis, unspecified whether generalized or localized, unspecified site   . Malignant neoplasm of breast (female), unspecified site   . Compression fracture of T12 vertebra 10/28/2011    Past Surgical History  Procedure Laterality Date  . Mastectomy  1987    Right  . Breast reconstruction  1998    Reconstruction   . Reduction mammaplasty  1998    Left  . Mastoidectomy  childhood  . Appendectomy  1948  . Lumbar laminectomy  2003  . Shoulder surgery  02/2005    Bilateral fractures with multiple surgeries  . Breast implant removal  06/12/09    right  . Abdominal hysterectomy    . Fracture surgery      fracture right elbow  . Coronary angioplasty  11/12    distal RCA  . Mastectomy    . Left heart catheterization with coronary angiogram N/A 06/05/2011    Procedure: LEFT HEART CATHETERIZATION WITH CORONARY ANGIOGRAM;  Surgeon: Clent Demark, MD;  Location: [facility] CATH LAB;  Service: Cardiovascular;  Laterality: N/A;  . Left heart catheterization with coronary angiogram N/A 03/05/2014    Procedure: LEFT HEART CATHETERIZATION WITH CORONARY ANGIOGRAM;  Surgeon: Clent Demark, MD;  Location: [facility] CATH LAB;  Service: Cardiovascular;  Laterality: N/A;    Family History  Problem Relation Age of Onset  . Heart attack Father 20  . Cancer Mother     ovarian  . Cancer Brother     lung  . Arthritis      family    History   Social History  . Marital Status: Widowed    Spouse Name: N/A  . Number of Children: 2  . Years of Education: N/A   Occupational History  . 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     Social History Main Topics  . Smoking status: Former Smoker    Quit date: 07/13/1974  .  Smokeless tobacco: Never Used  . Alcohol Use: 0.0 oz/week    0 Standard drinks or equivalent per week     Comment: occasional  . Drug Use: No  . Sexual Activity: No   Other Topics Concern  . Not on file   Social History Narrative   Has living will   Son or daughter would be health care POA.   Requests DNR--written 03/02/13   No tube feeds  if cognitively unaware   Review of Systems Sleeping well---has been very active in days lately so is tired for bedtime Appetite is good Bowels moving okay    Objective:   Physical Exam  Constitutional: She appears well-developed and well-nourished. No distress.  Neck: Normal range of motion. Neck supple. No thyromegaly present.  Cardiovascular: Normal rate, regular rhythm and normal heart sounds.  Exam reveals no gallop.   No murmur heard. Pulmonary/Chest: Effort normal and breath sounds normal. No respiratory distress. She has no wheezes. She has no rales.  Musculoskeletal: She exhibits no edema.  Boot on right foot  Lymphadenopathy:    She has no cervical adenopathy.  Psychiatric: She has a normal mood and affect. Her behavior is normal.          Assessment & Plan:

## 2014-10-02 NOTE — Assessment & Plan Note (Signed)
Very sensitive to tree pollen Doing okay on current regimen

## 2014-10-03 ENCOUNTER — Encounter: Payer: Self-pay | Admitting: *Deleted

## 2014-10-04 ENCOUNTER — Telehealth: Payer: Self-pay | Admitting: Internal Medicine

## 2014-10-04 DIAGNOSIS — M810 Age-related osteoporosis without current pathological fracture: Secondary | ICD-10-CM | POA: Diagnosis not present

## 2014-10-04 DIAGNOSIS — M1711 Unilateral primary osteoarthritis, right knee: Secondary | ICD-10-CM | POA: Diagnosis not present

## 2014-10-04 DIAGNOSIS — Z7902 Long term (current) use of antithrombotics/antiplatelets: Secondary | ICD-10-CM | POA: Diagnosis not present

## 2014-10-04 DIAGNOSIS — Z7982 Long term (current) use of aspirin: Secondary | ICD-10-CM | POA: Diagnosis not present

## 2014-10-04 DIAGNOSIS — Z87891 Personal history of nicotine dependence: Secondary | ICD-10-CM | POA: Diagnosis not present

## 2014-10-04 DIAGNOSIS — K219 Gastro-esophageal reflux disease without esophagitis: Secondary | ICD-10-CM | POA: Diagnosis not present

## 2014-10-04 DIAGNOSIS — M179 Osteoarthritis of knee, unspecified: Secondary | ICD-10-CM | POA: Diagnosis not present

## 2014-10-04 DIAGNOSIS — I10 Essential (primary) hypertension: Secondary | ICD-10-CM | POA: Diagnosis not present

## 2014-10-04 DIAGNOSIS — S93401D Sprain of unspecified ligament of right ankle, subsequent encounter: Secondary | ICD-10-CM | POA: Diagnosis not present

## 2014-10-04 DIAGNOSIS — S92324D Nondisplaced fracture of second metatarsal bone, right foot, subsequent encounter for fracture with routine healing: Secondary | ICD-10-CM | POA: Diagnosis not present

## 2014-10-04 DIAGNOSIS — M1712 Unilateral primary osteoarthritis, left knee: Secondary | ICD-10-CM | POA: Diagnosis not present

## 2014-10-04 DIAGNOSIS — J449 Chronic obstructive pulmonary disease, unspecified: Secondary | ICD-10-CM | POA: Diagnosis not present

## 2014-10-04 DIAGNOSIS — I5020 Unspecified systolic (congestive) heart failure: Secondary | ICD-10-CM | POA: Diagnosis not present

## 2014-10-04 DIAGNOSIS — Z853 Personal history of malignant neoplasm of breast: Secondary | ICD-10-CM | POA: Diagnosis not present

## 2014-10-04 DIAGNOSIS — S92334D Nondisplaced fracture of third metatarsal bone, right foot, subsequent encounter for fracture with routine healing: Secondary | ICD-10-CM | POA: Diagnosis not present

## 2014-10-04 DIAGNOSIS — Z9181 History of falling: Secondary | ICD-10-CM | POA: Diagnosis not present

## 2014-10-04 DIAGNOSIS — F329 Major depressive disorder, single episode, unspecified: Secondary | ICD-10-CM | POA: Diagnosis not present

## 2014-10-04 DIAGNOSIS — S92354D Nondisplaced fracture of fifth metatarsal bone, right foot, subsequent encounter for fracture with routine healing: Secondary | ICD-10-CM | POA: Diagnosis not present

## 2014-10-04 DIAGNOSIS — G43909 Migraine, unspecified, not intractable, without status migrainosus: Secondary | ICD-10-CM | POA: Diagnosis not present

## 2014-10-04 NOTE — Telephone Encounter (Signed)
Thank you :)

## 2014-10-04 NOTE — Telephone Encounter (Signed)
I have sent pt's info for [place] verification and will let you know once I have a response. Thank you.

## 2014-10-08 NOTE — Telephone Encounter (Signed)
I have rec'd pt's insurance verification for Prolia.  If an OV is billed pt will have a $5 co-pay plus a 20% co-insurance (approximately $180) for a total estimated responsibility of $185; if there is no OV billed pt will have 20% co-insurance for a total estimated responsibility of $180.  I have sent a copy of the summary benefits to be scanned into pt's chart.  If Haley Harris can't afford $882-$800 for her Prolia injection, please advise her to contact [employer] at [phone] to see if she qualifies for one of their assistance programs.  If she qualifies they will instruct her how to proceed.  Let me know if you have any questions. Thank you.

## 2014-10-08 NOTE — Telephone Encounter (Signed)
I am okay with her waiting till her next appt if that works out better

## 2014-10-08 NOTE — Telephone Encounter (Signed)
Pt is aware of cost, pt scheduled appt 10/11/14

## 2014-10-09 DIAGNOSIS — Z7982 Long term (current) use of aspirin: Secondary | ICD-10-CM | POA: Diagnosis not present

## 2014-10-09 DIAGNOSIS — K219 Gastro-esophageal reflux disease without esophagitis: Secondary | ICD-10-CM | POA: Diagnosis not present

## 2014-10-09 DIAGNOSIS — F329 Major depressive disorder, single episode, unspecified: Secondary | ICD-10-CM | POA: Diagnosis not present

## 2014-10-09 DIAGNOSIS — S92324D Nondisplaced fracture of second metatarsal bone, right foot, subsequent encounter for fracture with routine healing: Secondary | ICD-10-CM | POA: Diagnosis not present

## 2014-10-09 DIAGNOSIS — Z9181 History of falling: Secondary | ICD-10-CM | POA: Diagnosis not present

## 2014-10-09 DIAGNOSIS — Z7902 Long term (current) use of antithrombotics/antiplatelets: Secondary | ICD-10-CM | POA: Diagnosis not present

## 2014-10-09 DIAGNOSIS — I10 Essential (primary) hypertension: Secondary | ICD-10-CM | POA: Diagnosis not present

## 2014-10-09 DIAGNOSIS — S93401D Sprain of unspecified ligament of right ankle, subsequent encounter: Secondary | ICD-10-CM | POA: Diagnosis not present

## 2014-10-09 DIAGNOSIS — Z87891 Personal history of nicotine dependence: Secondary | ICD-10-CM | POA: Diagnosis not present

## 2014-10-09 DIAGNOSIS — G43909 Migraine, unspecified, not intractable, without status migrainosus: Secondary | ICD-10-CM | POA: Diagnosis not present

## 2014-10-09 DIAGNOSIS — M810 Age-related osteoporosis without current pathological fracture: Secondary | ICD-10-CM | POA: Diagnosis not present

## 2014-10-09 DIAGNOSIS — Z853 Personal history of malignant neoplasm of breast: Secondary | ICD-10-CM | POA: Diagnosis not present

## 2014-10-09 DIAGNOSIS — S92354D Nondisplaced fracture of fifth metatarsal bone, right foot, subsequent encounter for fracture with routine healing: Secondary | ICD-10-CM | POA: Diagnosis not present

## 2014-10-09 DIAGNOSIS — S92334D Nondisplaced fracture of third metatarsal bone, right foot, subsequent encounter for fracture with routine healing: Secondary | ICD-10-CM | POA: Diagnosis not present

## 2014-10-09 DIAGNOSIS — I5020 Unspecified systolic (congestive) heart failure: Secondary | ICD-10-CM | POA: Diagnosis not present

## 2014-10-09 DIAGNOSIS — M179 Osteoarthritis of knee, unspecified: Secondary | ICD-10-CM | POA: Diagnosis not present

## 2014-10-09 DIAGNOSIS — J449 Chronic obstructive pulmonary disease, unspecified: Secondary | ICD-10-CM | POA: Diagnosis not present

## 2014-10-11 ENCOUNTER — Ambulatory Visit (INDEPENDENT_AMBULATORY_CARE_PROVIDER_SITE_OTHER): Payer: Medicare Other | Admitting: *Deleted

## 2014-10-11 DIAGNOSIS — M810 Age-related osteoporosis without current pathological fracture: Secondary | ICD-10-CM | POA: Diagnosis not present

## 2014-10-11 MED ORDER — DENOSUMAB 60 MG/ML ~~LOC~~ SOLN
60.0000 mg | Freq: Once | SUBCUTANEOUS | Status: AC
  Administered 2014-10-11: 60 mg via SUBCUTANEOUS

## 2014-10-12 DIAGNOSIS — S92334D Nondisplaced fracture of third metatarsal bone, right foot, subsequent encounter for fracture with routine healing: Secondary | ICD-10-CM | POA: Diagnosis not present

## 2014-10-12 DIAGNOSIS — G43909 Migraine, unspecified, not intractable, without status migrainosus: Secondary | ICD-10-CM | POA: Diagnosis not present

## 2014-10-12 DIAGNOSIS — Z9181 History of falling: Secondary | ICD-10-CM | POA: Diagnosis not present

## 2014-10-12 DIAGNOSIS — M179 Osteoarthritis of knee, unspecified: Secondary | ICD-10-CM | POA: Diagnosis not present

## 2014-10-12 DIAGNOSIS — Z87891 Personal history of nicotine dependence: Secondary | ICD-10-CM | POA: Diagnosis not present

## 2014-10-12 DIAGNOSIS — F329 Major depressive disorder, single episode, unspecified: Secondary | ICD-10-CM | POA: Diagnosis not present

## 2014-10-12 DIAGNOSIS — Z853 Personal history of malignant neoplasm of breast: Secondary | ICD-10-CM | POA: Diagnosis not present

## 2014-10-12 DIAGNOSIS — S93401D Sprain of unspecified ligament of right ankle, subsequent encounter: Secondary | ICD-10-CM | POA: Diagnosis not present

## 2014-10-12 DIAGNOSIS — S92324D Nondisplaced fracture of second metatarsal bone, right foot, subsequent encounter for fracture with routine healing: Secondary | ICD-10-CM | POA: Diagnosis not present

## 2014-10-12 DIAGNOSIS — I10 Essential (primary) hypertension: Secondary | ICD-10-CM | POA: Diagnosis not present

## 2014-10-12 DIAGNOSIS — Z7902 Long term (current) use of antithrombotics/antiplatelets: Secondary | ICD-10-CM | POA: Diagnosis not present

## 2014-10-12 DIAGNOSIS — Z7982 Long term (current) use of aspirin: Secondary | ICD-10-CM | POA: Diagnosis not present

## 2014-10-12 DIAGNOSIS — K219 Gastro-esophageal reflux disease without esophagitis: Secondary | ICD-10-CM | POA: Diagnosis not present

## 2014-10-12 DIAGNOSIS — M810 Age-related osteoporosis without current pathological fracture: Secondary | ICD-10-CM | POA: Diagnosis not present

## 2014-10-12 DIAGNOSIS — J449 Chronic obstructive pulmonary disease, unspecified: Secondary | ICD-10-CM | POA: Diagnosis not present

## 2014-10-12 DIAGNOSIS — S92354D Nondisplaced fracture of fifth metatarsal bone, right foot, subsequent encounter for fracture with routine healing: Secondary | ICD-10-CM | POA: Diagnosis not present

## 2014-10-12 DIAGNOSIS — I5020 Unspecified systolic (congestive) heart failure: Secondary | ICD-10-CM | POA: Diagnosis not present

## 2014-10-15 ENCOUNTER — Ambulatory Visit (INDEPENDENT_AMBULATORY_CARE_PROVIDER_SITE_OTHER): Payer: Medicare Other | Admitting: Podiatry

## 2014-10-15 DIAGNOSIS — B351 Tinea unguium: Secondary | ICD-10-CM | POA: Diagnosis not present

## 2014-10-15 DIAGNOSIS — M79676 Pain in unspecified toe(s): Secondary | ICD-10-CM

## 2014-10-15 NOTE — Progress Notes (Signed)
She presents today complaining of left knee pain. She's also complaining of painful elongated toenails one through 5 bilateral. Fractured R lesser toes at end of Jan.  Objective: Nails are thick yellow dystrophic onychomycotic bilateral. Pulses are palpable bilateral.  Assessment: Pain in limb secondary to onychomycosis 1 through 5 bilateral.  Plan: Debridement of nails 1 through 5 bilateral.

## 2014-10-16 DIAGNOSIS — I5020 Unspecified systolic (congestive) heart failure: Secondary | ICD-10-CM | POA: Diagnosis not present

## 2014-10-16 DIAGNOSIS — K219 Gastro-esophageal reflux disease without esophagitis: Secondary | ICD-10-CM | POA: Diagnosis not present

## 2014-10-16 DIAGNOSIS — S92354D Nondisplaced fracture of fifth metatarsal bone, right foot, subsequent encounter for fracture with routine healing: Secondary | ICD-10-CM | POA: Diagnosis not present

## 2014-10-16 DIAGNOSIS — Z7902 Long term (current) use of antithrombotics/antiplatelets: Secondary | ICD-10-CM | POA: Diagnosis not present

## 2014-10-16 DIAGNOSIS — S92324D Nondisplaced fracture of second metatarsal bone, right foot, subsequent encounter for fracture with routine healing: Secondary | ICD-10-CM | POA: Diagnosis not present

## 2014-10-16 DIAGNOSIS — M179 Osteoarthritis of knee, unspecified: Secondary | ICD-10-CM | POA: Diagnosis not present

## 2014-10-16 DIAGNOSIS — Z87891 Personal history of nicotine dependence: Secondary | ICD-10-CM | POA: Diagnosis not present

## 2014-10-16 DIAGNOSIS — M810 Age-related osteoporosis without current pathological fracture: Secondary | ICD-10-CM | POA: Diagnosis not present

## 2014-10-16 DIAGNOSIS — Z9181 History of falling: Secondary | ICD-10-CM | POA: Diagnosis not present

## 2014-10-16 DIAGNOSIS — Z853 Personal history of malignant neoplasm of breast: Secondary | ICD-10-CM | POA: Diagnosis not present

## 2014-10-16 DIAGNOSIS — G43909 Migraine, unspecified, not intractable, without status migrainosus: Secondary | ICD-10-CM | POA: Diagnosis not present

## 2014-10-16 DIAGNOSIS — S92334D Nondisplaced fracture of third metatarsal bone, right foot, subsequent encounter for fracture with routine healing: Secondary | ICD-10-CM | POA: Diagnosis not present

## 2014-10-16 DIAGNOSIS — F329 Major depressive disorder, single episode, unspecified: Secondary | ICD-10-CM | POA: Diagnosis not present

## 2014-10-16 DIAGNOSIS — J449 Chronic obstructive pulmonary disease, unspecified: Secondary | ICD-10-CM | POA: Diagnosis not present

## 2014-10-16 DIAGNOSIS — I10 Essential (primary) hypertension: Secondary | ICD-10-CM | POA: Diagnosis not present

## 2014-10-16 DIAGNOSIS — Z7982 Long term (current) use of aspirin: Secondary | ICD-10-CM | POA: Diagnosis not present

## 2014-10-16 DIAGNOSIS — S93401D Sprain of unspecified ligament of right ankle, subsequent encounter: Secondary | ICD-10-CM | POA: Diagnosis not present

## 2014-10-19 DIAGNOSIS — S92354D Nondisplaced fracture of fifth metatarsal bone, right foot, subsequent encounter for fracture with routine healing: Secondary | ICD-10-CM | POA: Diagnosis not present

## 2014-10-19 DIAGNOSIS — M179 Osteoarthritis of knee, unspecified: Secondary | ICD-10-CM | POA: Diagnosis not present

## 2014-10-19 DIAGNOSIS — G43909 Migraine, unspecified, not intractable, without status migrainosus: Secondary | ICD-10-CM | POA: Diagnosis not present

## 2014-10-19 DIAGNOSIS — Z9181 History of falling: Secondary | ICD-10-CM | POA: Diagnosis not present

## 2014-10-19 DIAGNOSIS — K219 Gastro-esophageal reflux disease without esophagitis: Secondary | ICD-10-CM | POA: Diagnosis not present

## 2014-10-19 DIAGNOSIS — F329 Major depressive disorder, single episode, unspecified: Secondary | ICD-10-CM | POA: Diagnosis not present

## 2014-10-19 DIAGNOSIS — M810 Age-related osteoporosis without current pathological fracture: Secondary | ICD-10-CM | POA: Diagnosis not present

## 2014-10-19 DIAGNOSIS — I10 Essential (primary) hypertension: Secondary | ICD-10-CM | POA: Diagnosis not present

## 2014-10-19 DIAGNOSIS — J449 Chronic obstructive pulmonary disease, unspecified: Secondary | ICD-10-CM | POA: Diagnosis not present

## 2014-10-19 DIAGNOSIS — Z87891 Personal history of nicotine dependence: Secondary | ICD-10-CM | POA: Diagnosis not present

## 2014-10-19 DIAGNOSIS — Z7902 Long term (current) use of antithrombotics/antiplatelets: Secondary | ICD-10-CM | POA: Diagnosis not present

## 2014-10-19 DIAGNOSIS — I5020 Unspecified systolic (congestive) heart failure: Secondary | ICD-10-CM | POA: Diagnosis not present

## 2014-10-19 DIAGNOSIS — S92324D Nondisplaced fracture of second metatarsal bone, right foot, subsequent encounter for fracture with routine healing: Secondary | ICD-10-CM | POA: Diagnosis not present

## 2014-10-19 DIAGNOSIS — Z853 Personal history of malignant neoplasm of breast: Secondary | ICD-10-CM | POA: Diagnosis not present

## 2014-10-19 DIAGNOSIS — S92334D Nondisplaced fracture of third metatarsal bone, right foot, subsequent encounter for fracture with routine healing: Secondary | ICD-10-CM | POA: Diagnosis not present

## 2014-10-19 DIAGNOSIS — S93401D Sprain of unspecified ligament of right ankle, subsequent encounter: Secondary | ICD-10-CM | POA: Diagnosis not present

## 2014-10-19 DIAGNOSIS — Z7982 Long term (current) use of aspirin: Secondary | ICD-10-CM | POA: Diagnosis not present

## 2014-11-02 NOTE — Op Note (Signed)
PATIENT NAME:  Haley Harris, Haley Harris MR#:  [id number] DATE OF BIRTH:  22-Dec-1928  DATE OF PROCEDURE:  06/19/2013  PREOPERATIVE DIAGNOSIS: Cataract, right eye.   POSTOPERATIVE DIAGNOSIS: Cataract, right eye.  PROCEDURE PERFORMED: Extracapsular cataract extraction using phacoemulsification with placement of Alcon [id number], 19.5-diopter posterior chamber lens, serial number [id number].   SURGEON: Loura Back. Esaul Dorwart, M.D.   ANESTHESIA: Lidocaine 4% and 0.75% Marcaine a 50-50 mixture with 10 units/mL of HyoMax added, given as a peribulbar.   ANESTHESIOLOGIST: Dr. Ronelle Nigh.   COMPLICATIONS: None.   ESTIMATED BLOOD LOSS: Less than 1 mL.   DESCRIPTION OF PROCEDURE: The patient was brought to the operating room and given a peribulbar block.  The patient was then prepped and draped in the usual fashion.  The vertical rectus muscles were imbricated using 5-0 silk sutures.  These sutures were then clamped to the sterile drapes as bridle sutures.  A limbal peritomy was performed extending two clock hours and hemostasis was obtained with cautery.  A partial thickness scleral groove was made at the surgical limbus and dissected anteriorly in a lamellar dissection using an Alcon crescent knife.  The anterior chamber was entered superonasally with a Superblade and through the lamellar dissection with a 2.6 mm keratome.  DisCoVisc was used to replace the aqueous and a continuous tear capsulorrhexis was carried out.  Hydrodissection and hydrodelineation were carried out with balanced salt and a 27 gauge canula.  The nucleus was rotated to confirm the effectiveness of the hydrodissection.  Phacoemulsification was carried out using a divide-and-conquer technique.  Total ultrasound time was 1 minute and 27 seconds with an average power of  24.8%. CDE 37.10.  Irrigation/aspiration was used to remove the residual cortex.  DisCoVisc was used to inflate the capsule and the internal incision was enlarged to 3 mm with the  crescent knife.  The intraocular lens was folded and inserted into the capsular bag using the AcrySert delivery system.  Irrigation/aspiration was used to remove the residual DisCoVisc.  Miostat was injected into the anterior chamber through the paracentesis track to inflate the anterior chamber and induce miosis. No antibiotics were injected. The wound was checked for leaks and none were found. The conjunctiva was closed with cautery and the bridle sutures were removed.  Two drops of 0.3% Vigamox were placed on the eye.   An eye shield was placed on the eye.  The patient was discharged to the recovery room in good condition.  ____________________________ Loura Back Leanza Shepperson, MD sad:aw D: 06/19/2013 13:29:37 ET T: 06/19/2013 13:45:45 ET JOB#: [id number]  cc: Remo Lipps A. Donya Tomaro, MD, <Dictator> Martie Lee MD ELECTRONICALLY SIGNED 06/26/2013 12:47

## 2014-11-09 DIAGNOSIS — I5020 Unspecified systolic (congestive) heart failure: Secondary | ICD-10-CM | POA: Diagnosis not present

## 2014-11-09 DIAGNOSIS — I10 Essential (primary) hypertension: Secondary | ICD-10-CM | POA: Diagnosis not present

## 2014-11-09 DIAGNOSIS — I2510 Atherosclerotic heart disease of native coronary artery without angina pectoris: Secondary | ICD-10-CM | POA: Diagnosis not present

## 2014-11-09 DIAGNOSIS — I255 Ischemic cardiomyopathy: Secondary | ICD-10-CM | POA: Diagnosis not present

## 2014-11-09 DIAGNOSIS — I252 Old myocardial infarction: Secondary | ICD-10-CM | POA: Diagnosis not present

## 2014-12-05 DIAGNOSIS — M1712 Unilateral primary osteoarthritis, left knee: Secondary | ICD-10-CM | POA: Diagnosis not present

## 2014-12-14 ENCOUNTER — Other Ambulatory Visit: Payer: Self-pay | Admitting: Internal Medicine

## 2014-12-17 ENCOUNTER — Telehealth: Payer: Self-pay | Admitting: Internal Medicine

## 2014-12-17 NOTE — Telephone Encounter (Signed)
Patient Name: Haley Harris  DOB: 1929/06/19    Initial Comment [name] states having problem with left leg swelling, has been wearing compression hose, and this morning noticed a slight lump in her left along with the swelling    Nurse Assessment  Nurse: Leilani Merl, RN, Heather Date/Time ([place] Time): 12/17/2014 2:03:48 PM  Confirm and document reason for call. If symptomatic, describe symptoms. ---[name] states having problem with left leg swelling, has been wearing compression hose, and this morning noticed a slight lump in her left along with the swelling  Has the patient traveled out of the country within the last 30 days? ---Not Applicable  Does the patient require triage? ---Yes  Related visit to physician within the last 2 weeks? ---No  Does the PT have any chronic conditions? (i.e. diabetes, asthma, etc.) ---Yes  List chronic conditions. ---CHF, COPD     Guidelines    Guideline Title Affirmed Question Affirmed Notes  Leg Swelling and Edema [1] Thigh or calf pain AND [2] only 1 side AND [3] present > 1 hour    Final Disposition User   See Physician within 4 Hours (or PCP triage) [place], RN, [name] states that she only wants to see Dr. Silvio Pate and he does not have any appts for today and she triaged to be seen within 4 hours. [name] would like an appt with Dr. Silvio Pate for tomorrow.

## 2014-12-17 NOTE — Telephone Encounter (Signed)
Please give her appt tomorrow

## 2014-12-17 NOTE — Telephone Encounter (Signed)
appt made for tomorrow

## 2014-12-18 ENCOUNTER — Encounter: Payer: Self-pay | Admitting: Internal Medicine

## 2014-12-18 ENCOUNTER — Ambulatory Visit (INDEPENDENT_AMBULATORY_CARE_PROVIDER_SITE_OTHER): Payer: Medicare Other | Admitting: Internal Medicine

## 2014-12-18 VITALS — BP 120/70 | HR 67 | Temp 98.4°F | Wt 171.0 lb

## 2014-12-18 DIAGNOSIS — I872 Venous insufficiency (chronic) (peripheral): Secondary | ICD-10-CM | POA: Diagnosis not present

## 2014-12-18 NOTE — Progress Notes (Signed)
Pre visit review using our clinic review tool, if applicable. No additional management support is needed unless otherwise documented below in the visit note. 

## 2014-12-18 NOTE — Assessment & Plan Note (Signed)
Seems to have done well with the compression hose No mass or lump I suspect the bulge area is the spot where she starts swelling earliest when the hose are removed Reassured---okay to travel Continue the support hose--especially for her car ride and if prolonged sitting

## 2014-12-18 NOTE — Progress Notes (Signed)
Subjective:    Patient ID: Haley Harris, female    DOB: 1929/03/08, 81 y.o.   MRN: [id number]  HPI Here with daughter for lump in leg  Left leg swelling a lot lately Went to ortho 2 weeks ago--got cortisone shot in left knee Recommended low pressure support hose for the swelling in legs (can't wear the brace he suggested with the swelling) Started the light and then moderate hose this weekend---knee high Wore each for one day then noted lump lateral left calf  No pain but slight tenderness in the lump No Rx for this  Current Outpatient Prescriptions on File Prior to Visit  Medication Sig Dispense Refill  . albuterol (PROVENTIL) (2.5 MG/3ML) 0.083% nebulizer solution Take 3 mLs (2.5 mg total) by nebulization every 6 (six) hours as needed for wheezing or shortness of breath. 75 mL 3  . aspirin 81 MG tablet Take 81 mg by mouth daily after lunch.     . Calcium Carbonate-Vitamin D (CALTRATE 600+D PO) Take 1 tablet by mouth daily after lunch.     . carvedilol (COREG) 12.5 MG tablet Take 1 tablet (12.5 mg total) by mouth 2 (two) times daily with a meal. 180 tablet 3  . cetirizine (ZYRTEC) 10 MG tablet Take 10 mg by mouth daily. For allergies    . clopidogrel (PLAVIX) 75 MG tablet Take 1 tablet (75 mg total) by mouth daily with breakfast. 90 tablet 3  . denosumab (PROLIA) 60 MG/ML SOLN injection Inject 60 mg into the skin every 6 (six) months. Administer in upper arm, thigh, or abdomen    . escitalopram (LEXAPRO) 10 MG tablet Take 1 tablet (10 mg total) by mouth daily. 90 tablet 3  . famotidine (PEPCID) 20 MG tablet Take 1 tablet (20 mg total) by mouth 2 (two) times daily. 180 tablet 3  . fluticasone (FLONASE) 50 MCG/ACT nasal spray Place 2 sprays into both nostrils daily. 48 g 3  . furosemide (LASIX) 40 MG tablet Take 1 tablet (40 mg total) by mouth daily. 90 tablet 3  . gabapentin (NEURONTIN) 100 MG capsule Take 1 capsule (100 mg total) by mouth 2 (two) times daily. 180 capsule 3  .  losartan (COZAAR) 50 MG tablet TAKE 1 TABLET BY MOUTH DAILY 90 tablet 3  . montelukast (SINGULAIR) 10 MG tablet TAKE 1 TABLET BY MOUTH DAILY 90 tablet 3  . Multiple Vitamin (MULITIVITAMIN WITH MINERALS) TABS Take 1 tablet by mouth daily after lunch.     . oxyCODONE-acetaminophen (PERCOCET) 5-325 MG per tablet Take 2 tablets by mouth every 4 (four) hours as needed for pain.     . pravastatin (PRAVACHOL) 80 MG tablet Take 1 tablet by mouth  daily 90 tablet 3  . spironolactone (ALDACTONE) 25 MG tablet Take 1 tablet (25 mg total) by mouth daily. 90 tablet 3  . vitamin B-12 (CYANOCOBALAMIN) 500 MCG tablet Take 1,000 mcg by mouth 3 (three) times a week. Mon, Wed, Fri     No current facility-administered medications on file prior to visit.    Allergies  Allergen Reactions  . Amoxicillin-Pot Clavulanate Anaphylaxis  . Cephalexin Other (See Comments)    Reaction unknown  . Clarithromycin Other (See Comments)    Reaction unknown  . Nifedipine Other (See Comments)    Reaction unknown  . Nsaids Other (See Comments)    Heart issue  . Olmesartan Medoxomil     REACTION: cough;  But tolerating Losartan 02/2014  . Tramadol Hcl Other (See Comments)  Reaction unknown    Past Medical History  Diagnosis Date  . Allergic rhinitis, cause unspecified   . Unspecified cardiovascular disease   . Personal history of malignant neoplasm of breast   . Occlusion and stenosis of carotid artery without mention of cerebral infarction   . Chronic airway obstruction, not elsewhere classified   . Depressive disorder, not elsewhere classified   . Other dyspnea and respiratory abnormality   . Esophageal reflux   . Unspecified hearing loss   . Other and unspecified hyperlipidemia   . Unspecified essential hypertension   . Osteoporosis, unspecified   . Peripheral vascular disease, unspecified   . Unspecified urinary incontinence   . Spinal stenosis   . ACE-inhibitor cough   . Angina   . Heart murmur     aS  CHILD  . Shortness of breath   . Recurrent upper respiratory infection (URI)   . Anxiety   . Pneumonia   . 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 Myocardial infarct   . Osteoarthrosis, unspecified whether generalized or localized, unspecified site   . Malignant neoplasm of breast (female), unspecified site   . Compression fracture of T12 vertebra 10/28/2011    Past Surgical History  Procedure Laterality Date  . Mastectomy  1987    Right  . Breast reconstruction  1998    Reconstruction   . Reduction mammaplasty  1998    Left  . Mastoidectomy  childhood  . Appendectomy  1948  . Lumbar laminectomy  2003  . Shoulder surgery  02/2005    Bilateral fractures with multiple surgeries  . Breast implant removal  06/12/09    right  . Abdominal hysterectomy    . Fracture surgery      fracture right elbow  . Coronary angioplasty  11/12    distal RCA  . Mastectomy    . Left heart catheterization with coronary angiogram N/A 06/05/2011    Procedure: LEFT HEART CATHETERIZATION WITH CORONARY ANGIOGRAM;  Surgeon: Clent Demark, MD;  Location: [facility] CATH LAB;  Service: Cardiovascular;  Laterality: N/A;  . Left heart catheterization with coronary angiogram N/A 03/05/2014    Procedure: LEFT HEART CATHETERIZATION WITH CORONARY ANGIOGRAM;  Surgeon: Clent Demark, MD;  Location: [facility] CATH LAB;  Service: Cardiovascular;  Laterality: N/A;    Family History  Problem Relation Age of Onset  . Heart attack Father 79  . Cancer Mother     ovarian  . Cancer Brother     lung  . Arthritis      family    History   Social History  . Marital Status: Widowed    Spouse Name: N/A  . Number of Children: 2  . Years of Education: N/A   Occupational History  . 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     Social History Main Topics  . Smoking status: Former Smoker    Quit date: 07/13/1974  . Smokeless tobacco: Never Used  . Alcohol Use: 0.0 oz/week    0 Standard drinks or  equivalent per week     Comment: occasional  . Drug Use: No  . Sexual Activity: No   Other Topics Concern  . Not on file   Social History Narrative   Has living will   Son or daughter would be health care POA.   Requests DNR--written 03/02/13   No tube feeds if cognitively unaware   Review of Systems Plans visit to [facility]  next week with family--- really looking forward to this No fever Not sick No chest pain No SOB more than her usual Weight fluctuating a lot-- 165-168#    Objective:   Physical Exam  Constitutional: She appears well-developed. No distress.  Musculoskeletal:  Has slight bulge along the lateral left calf--about 1/3rd up leg Slight veins seen then No true mass or lump No calf or thigh cords or tenderness          Assessment & Plan:

## 2014-12-19 ENCOUNTER — Ambulatory Visit (INDEPENDENT_AMBULATORY_CARE_PROVIDER_SITE_OTHER): Payer: Medicare Other | Admitting: Podiatry

## 2014-12-19 ENCOUNTER — Encounter: Payer: Self-pay | Admitting: Podiatry

## 2014-12-19 DIAGNOSIS — M79676 Pain in unspecified toe(s): Secondary | ICD-10-CM | POA: Diagnosis not present

## 2014-12-19 DIAGNOSIS — B351 Tinea unguium: Secondary | ICD-10-CM | POA: Diagnosis not present

## 2014-12-19 NOTE — Progress Notes (Signed)
Patient presents to the office today with a chief complaint of painful elongated toenails.  Objective: Pulses are palpable bilateral. Nails are thick yellow dystrophic clinically mycotic and painful palpation.  Assessment: Pain in limb secondary to onychomycosis 1 through 5 bilateral.  Plan: Debridement of nails 1 through 5 bilateral covered service secondary to pain.  

## 2015-01-18 ENCOUNTER — Encounter (HOSPITAL_COMMUNITY): Payer: Self-pay | Admitting: *Deleted

## 2015-01-18 ENCOUNTER — Inpatient Hospital Stay (HOSPITAL_COMMUNITY)
Admission: EM | Admit: 2015-01-18 | Discharge: 2015-01-28 | DRG: 388 | Disposition: A | Discharge status: Skilled Nursing Facility | Payer: Medicare Other | Attending: Internal Medicine | Admitting: Internal Medicine

## 2015-01-18 ENCOUNTER — Telehealth: Payer: Self-pay | Admitting: Internal Medicine

## 2015-01-18 ENCOUNTER — Emergency Department (HOSPITAL_COMMUNITY): Payer: Medicare Other

## 2015-01-18 DIAGNOSIS — R1084 Generalized abdominal pain: Secondary | ICD-10-CM | POA: Diagnosis not present

## 2015-01-18 DIAGNOSIS — I255 Ischemic cardiomyopathy: Secondary | ICD-10-CM | POA: Diagnosis present

## 2015-01-18 DIAGNOSIS — Z90710 Acquired absence of both cervix and uterus: Secondary | ICD-10-CM | POA: Diagnosis not present

## 2015-01-18 DIAGNOSIS — K7689 Other specified diseases of liver: Secondary | ICD-10-CM | POA: Diagnosis not present

## 2015-01-18 DIAGNOSIS — Z853 Personal history of malignant neoplasm of breast: Secondary | ICD-10-CM | POA: Diagnosis not present

## 2015-01-18 DIAGNOSIS — M6281 Muscle weakness (generalized): Secondary | ICD-10-CM | POA: Diagnosis not present

## 2015-01-18 DIAGNOSIS — K219 Gastro-esophageal reflux disease without esophagitis: Secondary | ICD-10-CM | POA: Diagnosis present

## 2015-01-18 DIAGNOSIS — J811 Chronic pulmonary edema: Secondary | ICD-10-CM

## 2015-01-18 DIAGNOSIS — I2510 Atherosclerotic heart disease of native coronary artery without angina pectoris: Secondary | ICD-10-CM | POA: Diagnosis not present

## 2015-01-18 DIAGNOSIS — R933 Abnormal findings on diagnostic imaging of other parts of digestive tract: Secondary | ICD-10-CM | POA: Diagnosis not present

## 2015-01-18 DIAGNOSIS — J810 Acute pulmonary edema: Secondary | ICD-10-CM | POA: Diagnosis not present

## 2015-01-18 DIAGNOSIS — J40 Bronchitis, not specified as acute or chronic: Secondary | ICD-10-CM | POA: Diagnosis not present

## 2015-01-18 DIAGNOSIS — E785 Hyperlipidemia, unspecified: Secondary | ICD-10-CM | POA: Diagnosis not present

## 2015-01-18 DIAGNOSIS — H9190 Unspecified hearing loss, unspecified ear: Secondary | ICD-10-CM | POA: Diagnosis present

## 2015-01-18 DIAGNOSIS — K5732 Diverticulitis of large intestine without perforation or abscess without bleeding: Secondary | ICD-10-CM | POA: Diagnosis not present

## 2015-01-18 DIAGNOSIS — I739 Peripheral vascular disease, unspecified: Secondary | ICD-10-CM | POA: Diagnosis present

## 2015-01-18 DIAGNOSIS — I252 Old myocardial infarction: Secondary | ICD-10-CM | POA: Diagnosis not present

## 2015-01-18 DIAGNOSIS — E278 Other specified disorders of adrenal gland: Secondary | ICD-10-CM | POA: Diagnosis present

## 2015-01-18 DIAGNOSIS — I214 Non-ST elevation (NSTEMI) myocardial infarction: Secondary | ICD-10-CM | POA: Diagnosis not present

## 2015-01-18 DIAGNOSIS — I517 Cardiomegaly: Secondary | ICD-10-CM | POA: Diagnosis not present

## 2015-01-18 DIAGNOSIS — E559 Vitamin D deficiency, unspecified: Secondary | ICD-10-CM | POA: Diagnosis not present

## 2015-01-18 DIAGNOSIS — M4854XD Collapsed vertebra, not elsewhere classified, thoracic region, subsequent encounter for fracture with routine healing: Secondary | ICD-10-CM | POA: Diagnosis not present

## 2015-01-18 DIAGNOSIS — M810 Age-related osteoporosis without current pathological fracture: Secondary | ICD-10-CM | POA: Diagnosis not present

## 2015-01-18 DIAGNOSIS — K5660 Unspecified intestinal obstruction: Secondary | ICD-10-CM | POA: Diagnosis not present

## 2015-01-18 DIAGNOSIS — D499 Neoplasm of unspecified behavior of unspecified site: Secondary | ICD-10-CM | POA: Diagnosis present

## 2015-01-18 DIAGNOSIS — N179 Acute kidney failure, unspecified: Secondary | ICD-10-CM | POA: Diagnosis present

## 2015-01-18 DIAGNOSIS — G629 Polyneuropathy, unspecified: Secondary | ICD-10-CM | POA: Diagnosis not present

## 2015-01-18 DIAGNOSIS — Z7982 Long term (current) use of aspirin: Secondary | ICD-10-CM

## 2015-01-18 DIAGNOSIS — N183 Chronic kidney disease, stage 3 unspecified: Secondary | ICD-10-CM | POA: Diagnosis present

## 2015-01-18 DIAGNOSIS — I25119 Atherosclerotic heart disease of native coronary artery with unspecified angina pectoris: Secondary | ICD-10-CM | POA: Diagnosis present

## 2015-01-18 DIAGNOSIS — D62 Acute posthemorrhagic anemia: Secondary | ICD-10-CM | POA: Diagnosis not present

## 2015-01-18 DIAGNOSIS — I129 Hypertensive chronic kidney disease with stage 1 through stage 4 chronic kidney disease, or unspecified chronic kidney disease: Secondary | ICD-10-CM | POA: Diagnosis present

## 2015-01-18 DIAGNOSIS — K5733 Diverticulitis of large intestine without perforation or abscess with bleeding: Secondary | ICD-10-CM | POA: Diagnosis not present

## 2015-01-18 DIAGNOSIS — G63 Polyneuropathy in diseases classified elsewhere: Secondary | ICD-10-CM | POA: Diagnosis present

## 2015-01-18 DIAGNOSIS — R069 Unspecified abnormalities of breathing: Secondary | ICD-10-CM | POA: Diagnosis not present

## 2015-01-18 DIAGNOSIS — J9601 Acute respiratory failure with hypoxia: Secondary | ICD-10-CM | POA: Diagnosis not present

## 2015-01-18 DIAGNOSIS — R0602 Shortness of breath: Secondary | ICD-10-CM

## 2015-01-18 DIAGNOSIS — E279 Disorder of adrenal gland, unspecified: Secondary | ICD-10-CM | POA: Diagnosis not present

## 2015-01-18 DIAGNOSIS — J449 Chronic obstructive pulmonary disease, unspecified: Secondary | ICD-10-CM | POA: Diagnosis not present

## 2015-01-18 DIAGNOSIS — R778 Other specified abnormalities of plasma proteins: Secondary | ICD-10-CM | POA: Diagnosis present

## 2015-01-18 DIAGNOSIS — R600 Localized edema: Secondary | ICD-10-CM | POA: Diagnosis present

## 2015-01-18 DIAGNOSIS — K56609 Unspecified intestinal obstruction, unspecified as to partial versus complete obstruction: Secondary | ICD-10-CM | POA: Diagnosis present

## 2015-01-18 DIAGNOSIS — C799 Secondary malignant neoplasm of unspecified site: Secondary | ICD-10-CM

## 2015-01-18 DIAGNOSIS — I700 Atherosclerosis of aorta: Secondary | ICD-10-CM | POA: Diagnosis not present

## 2015-01-18 DIAGNOSIS — K5669 Other intestinal obstruction: Secondary | ICD-10-CM | POA: Diagnosis not present

## 2015-01-18 DIAGNOSIS — Z87891 Personal history of nicotine dependence: Secondary | ICD-10-CM

## 2015-01-18 DIAGNOSIS — R109 Unspecified abdominal pain: Secondary | ICD-10-CM | POA: Diagnosis not present

## 2015-01-18 DIAGNOSIS — R110 Nausea: Secondary | ICD-10-CM | POA: Diagnosis not present

## 2015-01-18 DIAGNOSIS — I249 Acute ischemic heart disease, unspecified: Secondary | ICD-10-CM | POA: Diagnosis present

## 2015-01-18 DIAGNOSIS — E871 Hypo-osmolality and hyponatremia: Secondary | ICD-10-CM | POA: Diagnosis not present

## 2015-01-18 DIAGNOSIS — C7491 Malignant neoplasm of unspecified part of right adrenal gland: Secondary | ICD-10-CM | POA: Diagnosis not present

## 2015-01-18 DIAGNOSIS — I5042 Chronic combined systolic (congestive) and diastolic (congestive) heart failure: Secondary | ICD-10-CM | POA: Diagnosis present

## 2015-01-18 DIAGNOSIS — Z7902 Long term (current) use of antithrombotics/antiplatelets: Secondary | ICD-10-CM

## 2015-01-18 DIAGNOSIS — K5730 Diverticulosis of large intestine without perforation or abscess without bleeding: Secondary | ICD-10-CM | POA: Diagnosis not present

## 2015-01-18 DIAGNOSIS — R262 Difficulty in walking, not elsewhere classified: Secondary | ICD-10-CM | POA: Diagnosis not present

## 2015-01-18 DIAGNOSIS — I10 Essential (primary) hypertension: Secondary | ICD-10-CM | POA: Diagnosis not present

## 2015-01-18 DIAGNOSIS — I5031 Acute diastolic (congestive) heart failure: Secondary | ICD-10-CM | POA: Diagnosis not present

## 2015-01-18 DIAGNOSIS — Z955 Presence of coronary angioplasty implant and graft: Secondary | ICD-10-CM | POA: Diagnosis not present

## 2015-01-18 DIAGNOSIS — R7989 Other specified abnormal findings of blood chemistry: Secondary | ICD-10-CM | POA: Diagnosis present

## 2015-01-18 DIAGNOSIS — J438 Other emphysema: Secondary | ICD-10-CM | POA: Diagnosis present

## 2015-01-18 DIAGNOSIS — R278 Other lack of coordination: Secondary | ICD-10-CM | POA: Diagnosis not present

## 2015-01-18 DIAGNOSIS — J90 Pleural effusion, not elsewhere classified: Secondary | ICD-10-CM | POA: Diagnosis not present

## 2015-01-18 LAB — CBC WITH DIFFERENTIAL/PLATELET
BASOS ABS: 0 10*3/uL (ref 0.0–0.1)
Basophils Relative: 0 % (ref 0–1)
EOS ABS: 0 10*3/uL (ref 0.0–0.7)
Eosinophils Relative: 0 % (ref 0–5)
HEMATOCRIT: 35.3 % — AB (ref 36.0–46.0)
HEMOGLOBIN: 12.1 g/dL (ref 12.0–15.0)
Lymphocytes Relative: 5 % — ABNORMAL LOW (ref 12–46)
Lymphs Abs: 0.5 10*3/uL — ABNORMAL LOW (ref 0.7–4.0)
MCH: 32.4 pg (ref 26.0–34.0)
MCHC: 34.3 g/dL (ref 30.0–36.0)
MCV: 94.6 fL (ref 78.0–100.0)
Monocytes Absolute: 0.4 10*3/uL (ref 0.1–1.0)
Monocytes Relative: 5 % (ref 3–12)
NEUTROS ABS: 7.8 10*3/uL — AB (ref 1.7–7.7)
NEUTROS PCT: 90 % — AB (ref 43–77)
PLATELETS: 238 10*3/uL (ref 150–400)
RBC: 3.73 MIL/uL — ABNORMAL LOW (ref 3.87–5.11)
RDW: 12.4 % (ref 11.5–15.5)
WBC: 8.7 10*3/uL (ref 4.0–10.5)

## 2015-01-18 LAB — I-STAT CHEM 8, ED
BUN: 34 mg/dL — AB (ref 6–20)
CALCIUM ION: 1.06 mmol/L — AB (ref 1.13–1.30)
CHLORIDE: 97 mmol/L — AB (ref 101–111)
Creatinine, Ser: 1.1 mg/dL — ABNORMAL HIGH (ref 0.44–1.00)
GLUCOSE: 151 mg/dL — AB (ref 65–99)
HCT: 40 % (ref 36.0–46.0)
Hemoglobin: 13.6 g/dL (ref 12.0–15.0)
Potassium: 3.9 mmol/L (ref 3.5–5.1)
Sodium: 128 mmol/L — ABNORMAL LOW (ref 135–145)
TCO2: 16 mmol/L (ref 0–100)

## 2015-01-18 LAB — PROTIME-INR
INR: 1.08 (ref 0.00–1.49)
PROTHROMBIN TIME: 14.2 s (ref 11.6–15.2)

## 2015-01-18 LAB — URINALYSIS, ROUTINE W REFLEX MICROSCOPIC
GLUCOSE, UA: NEGATIVE mg/dL
Hgb urine dipstick: NEGATIVE
Ketones, ur: 40 mg/dL — AB
Leukocytes, UA: NEGATIVE
NITRITE: NEGATIVE
Protein, ur: NEGATIVE mg/dL
Specific Gravity, Urine: 1.018 (ref 1.005–1.030)
Urobilinogen, UA: 1 mg/dL (ref 0.0–1.0)
pH: 5 (ref 5.0–8.0)

## 2015-01-18 LAB — GLUCOSE, CAPILLARY
GLUCOSE-CAPILLARY: 126 mg/dL — AB (ref 65–99)
Glucose-Capillary: 127 mg/dL — ABNORMAL HIGH (ref 65–99)

## 2015-01-18 LAB — I-STAT TROPONIN, ED: Troponin i, poc: 0.61 ng/mL (ref 0.00–0.08)

## 2015-01-18 LAB — BRAIN NATRIURETIC PEPTIDE: B NATRIURETIC PEPTIDE 5: 1610 pg/mL — AB (ref 0.0–100.0)

## 2015-01-18 LAB — CREATININE, URINE, RANDOM: Creatinine, Urine: 88.91 mg/dL

## 2015-01-18 LAB — HEPATIC FUNCTION PANEL
ALT: 14 U/L (ref 14–54)
AST: 28 U/L (ref 15–41)
Albumin: 4 g/dL (ref 3.5–5.0)
Alkaline Phosphatase: 52 U/L (ref 38–126)
BILIRUBIN DIRECT: 0.2 mg/dL (ref 0.1–0.5)
Indirect Bilirubin: 1.1 mg/dL — ABNORMAL HIGH (ref 0.3–0.9)
TOTAL PROTEIN: 7.1 g/dL (ref 6.5–8.1)
Total Bilirubin: 1.3 mg/dL — ABNORMAL HIGH (ref 0.3–1.2)

## 2015-01-18 LAB — MRSA PCR SCREENING: MRSA by PCR: NEGATIVE

## 2015-01-18 LAB — D-DIMER, QUANTITATIVE (NOT AT ARMC): D DIMER QUANT: 1.93 ug{FEU}/mL — AB (ref 0.00–0.48)

## 2015-01-18 LAB — LIPASE, BLOOD: LIPASE: 20 U/L — AB (ref 22–51)

## 2015-01-18 LAB — SODIUM, URINE, RANDOM: Sodium, Ur: <10 mmol/L

## 2015-01-18 LAB — TROPONIN I: TROPONIN I: 0.76 ng/mL — AB (ref <0.031)

## 2015-01-18 MED ORDER — ASPIRIN EC 81 MG PO TBEC
81.0000 mg | DELAYED_RELEASE_TABLET | Freq: Every day | ORAL | Status: DC
  Administered 2015-01-18 – 2015-01-28 (×6): 81 mg via ORAL
  Filled 2015-01-18 (×9): qty 1

## 2015-01-18 MED ORDER — PHENOL 1.4 % MT LIQD
1.0000 | OROMUCOSAL | Status: DC | PRN
  Administered 2015-01-18: 1 via OROMUCOSAL
  Filled 2015-01-18: qty 177

## 2015-01-18 MED ORDER — FOLIC ACID 5 MG/ML IJ SOLN
1.0000 mg | Freq: Every day | INTRAMUSCULAR | Status: DC
  Administered 2015-01-18 – 2015-01-23 (×6): 1 mg via INTRAVENOUS
  Filled 2015-01-18 (×6): qty 0.2

## 2015-01-18 MED ORDER — ATORVASTATIN CALCIUM 20 MG PO TABS
20.0000 mg | ORAL_TABLET | Freq: Every day | ORAL | Status: DC
  Administered 2015-01-21: 20 mg via ORAL
  Filled 2015-01-18 (×4): qty 1

## 2015-01-18 MED ORDER — FAMOTIDINE IN NACL 20-0.9 MG/50ML-% IV SOLN
20.0000 mg | Freq: Two times a day (BID) | INTRAVENOUS | Status: DC
  Administered 2015-01-18 – 2015-01-19 (×2): 20 mg via INTRAVENOUS
  Filled 2015-01-18 (×4): qty 50

## 2015-01-18 MED ORDER — SODIUM CHLORIDE 0.9 % IJ SOLN
3.0000 mL | Freq: Two times a day (BID) | INTRAMUSCULAR | Status: DC
  Administered 2015-01-18 – 2015-01-22 (×4): 3 mL via INTRAVENOUS

## 2015-01-18 MED ORDER — IOHEXOL 300 MG/ML  SOLN
25.0000 mL | Freq: Once | INTRAMUSCULAR | Status: AC | PRN
  Administered 2015-01-18: 25 mL via ORAL

## 2015-01-18 MED ORDER — ACETAMINOPHEN 325 MG PO TABS
650.0000 mg | ORAL_TABLET | Freq: Four times a day (QID) | ORAL | Status: DC | PRN
  Administered 2015-01-25: 650 mg via ORAL
  Filled 2015-01-18: qty 2

## 2015-01-18 MED ORDER — NITROGLYCERIN 2 % TD OINT
0.5000 [in_us] | TOPICAL_OINTMENT | Freq: Three times a day (TID) | TRANSDERMAL | Status: DC
  Administered 2015-01-18 – 2015-01-19 (×3): 0.5 [in_us] via TOPICAL
  Filled 2015-01-18: qty 1
  Filled 2015-01-18: qty 30

## 2015-01-18 MED ORDER — SODIUM CHLORIDE 0.9 % IV BOLUS (SEPSIS)
500.0000 mL | Freq: Once | INTRAVENOUS | Status: AC
  Administered 2015-01-18: 500 mL via INTRAVENOUS

## 2015-01-18 MED ORDER — CARVEDILOL 3.125 MG PO TABS
3.1250 mg | ORAL_TABLET | Freq: Two times a day (BID) | ORAL | Status: DC
  Administered 2015-01-22 – 2015-01-28 (×13): 3.125 mg via ORAL
  Filled 2015-01-18 (×24): qty 1

## 2015-01-18 MED ORDER — THIAMINE HCL 100 MG/ML IJ SOLN
100.0000 mg | Freq: Every day | INTRAMUSCULAR | Status: DC
  Administered 2015-01-18 – 2015-01-23 (×6): 100 mg via INTRAVENOUS
  Filled 2015-01-18 (×6): qty 1

## 2015-01-18 MED ORDER — HEPARIN (PORCINE) IN NACL 100-0.45 UNIT/ML-% IJ SOLN
1100.0000 [IU]/h | INTRAMUSCULAR | Status: DC
  Administered 2015-01-18: 1250 [IU]/h via INTRAVENOUS
  Administered 2015-01-19: 1100 [IU]/h via INTRAVENOUS
  Filled 2015-01-18 (×3): qty 250

## 2015-01-18 MED ORDER — ONDANSETRON HCL 4 MG/2ML IJ SOLN
4.0000 mg | Freq: Once | INTRAMUSCULAR | Status: AC
  Administered 2015-01-18: 4 mg via INTRAVENOUS
  Filled 2015-01-18: qty 2

## 2015-01-18 MED ORDER — ALBUTEROL SULFATE (2.5 MG/3ML) 0.083% IN NEBU: 2.5000 mg | INHALATION_SOLUTION | Freq: Four times a day (QID) | RESPIRATORY_TRACT | Status: DC | PRN

## 2015-01-18 MED ORDER — PHENOL 1.4 % MT LIQD: 1.0000 | OROMUCOSAL | Status: DC | PRN

## 2015-01-18 MED ORDER — HYDROMORPHONE HCL 1 MG/ML IJ SOLN
0.5000 mg | Freq: Once | INTRAMUSCULAR | Status: AC
  Administered 2015-01-18: 0.5 mg via INTRAVENOUS
  Filled 2015-01-18: qty 1

## 2015-01-18 MED ORDER — HYDROMORPHONE HCL 1 MG/ML IJ SOLN
1.0000 mg | Freq: Once | INTRAMUSCULAR | Status: AC
  Administered 2015-01-18: 1 mg via INTRAVENOUS
  Filled 2015-01-18: qty 1

## 2015-01-18 MED ORDER — KCL IN DEXTROSE-NACL 20-5-0.9 MEQ/L-%-% IV SOLN
INTRAVENOUS | Status: DC
  Administered 2015-01-18: 18:00:00 via INTRAVENOUS
  Filled 2015-01-18 (×3): qty 1000

## 2015-01-18 MED ORDER — ACETAMINOPHEN 650 MG RE SUPP: 650.0000 mg | Freq: Four times a day (QID) | RECTAL | Status: DC | PRN

## 2015-01-18 MED ORDER — HEPARIN BOLUS VIA INFUSION
4000.0000 [IU] | Freq: Once | INTRAVENOUS | Status: AC
  Administered 2015-01-18: 4000 [IU] via INTRAVENOUS
  Filled 2015-01-18: qty 4000

## 2015-01-18 MED ORDER — IOHEXOL 300 MG/ML  SOLN
80.0000 mL | Freq: Once | INTRAMUSCULAR | Status: AC | PRN
  Administered 2015-01-18: 80 mL via INTRAVENOUS

## 2015-01-18 MED ORDER — MORPHINE SULFATE 2 MG/ML IJ SOLN
1.0000 mg | INTRAMUSCULAR | Status: DC | PRN
  Administered 2015-01-21 – 2015-01-22 (×3): 1 mg via INTRAVENOUS
  Filled 2015-01-18 (×3): qty 1

## 2015-01-18 MED ORDER — ONDANSETRON HCL 4 MG/2ML IJ SOLN: 4.0000 mg | Freq: Four times a day (QID) | INTRAMUSCULAR | Status: DC | PRN

## 2015-01-18 NOTE — Progress Notes (Signed)
ANTICOAGULATION CONSULT NOTE - Initial Consult  Pharmacy Consult for Heparin Indication: chest pain/ACS and presumed DVT  Allergies  Allergen Reactions  . Amoxicillin-Pot Clavulanate Anaphylaxis  . Cephalexin Other (See Comments)    Reaction unknown  . Clarithromycin Other (See Comments)    Reaction unknown  . Lidocaine   . Nifedipine Other (See Comments)    Reaction unknown  . Nsaids Other (See Comments)    Heart issue  . Olmesartan Medoxomil     REACTION: cough;  But tolerating Losartan 02/2014  . Penicillins   . Tramadol Hcl Other (See Comments)    Reaction unknown    Patient Measurements: Height: 5\' 6"  (167.6 cm) IBW/kg (Calculated) : 59.3 Heparin Dosing Weight: 75 kg  Vital Signs: Temp: 99 F (37.2 C) (07/08 1216) Temp Source: Oral (07/08 1216) BP: 114/60 mmHg (07/08 1600) Pulse Rate: 95 (07/08 1600)  Labs:  Recent Labs  01/18/15 1228 01/18/15 1243  HGB 12.1 13.6  HCT 35.3* 40.0  PLT 238  --   LABPROT 14.2  --   INR 1.08  --   CREATININE  --  1.10*    CrCl cannot be calculated (Unknown ideal weight.).   Medical History: Past Medical History  Diagnosis Date  . Allergic rhinitis, cause unspecified   . Unspecified cardiovascular disease   . Personal history of malignant neoplasm of breast   . Occlusion and stenosis of carotid artery without mention of cerebral infarction   . Chronic airway obstruction, not elsewhere classified   . Depressive disorder, not elsewhere classified   . Other dyspnea and respiratory abnormality   . Esophageal reflux   . Unspecified hearing loss   . Other and unspecified hyperlipidemia   . Unspecified essential hypertension   . Osteoporosis, unspecified   . Peripheral vascular disease, unspecified   . Unspecified urinary incontinence   . Spinal stenosis   . ACE-inhibitor cough   . Angina   . Heart murmur     aS CHILD  . Shortness of breath   . Recurrent upper respiratory infection (URI)   . Anxiety   . Pneumonia    . 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 Myocardial infarct   . Osteoarthrosis, unspecified whether generalized or localized, unspecified site   . Malignant neoplasm of breast (female), unspecified site   . Compression fracture of T12 vertebra 10/28/2011    Medications:   (Not in a hospital admission) Scheduled:   Infusions:  . dextrose 5 % and 0.9 % NaCl with KCl 20 mEq/L      Assessment: 81yo female presents with abdominal pain and SOB. Pharmacy is consulted to dose heparin for ACS/chest pain and a presumed LLE DVT. CBC is wnl, sCr 1.1, BNP 1610, Trop 0.61.  Goal of Therapy:  Heparin level 0.3-0.7 units/ml Monitor platelets by anticoagulation protocol: Yes   Plan:  Give 4000 units bolus x 1 Start heparin infusion at 1250 units/hr Check anti-Xa level in 8 hours and daily while on heparin Continue to monitor H&H and platelets  Andrey Cota. Diona Foley, PharmD Clinical Pharmacist Pager [phone] 01/18/2015,4:30 PM

## 2015-01-18 NOTE — ED Notes (Signed)
Patient transported to X-ray 

## 2015-01-18 NOTE — Consult Note (Signed)
Reason for Consult mildly elevated troponin I Referring Physician:  [facility] hospital[facility]  Haley Harris is an 81 y.o. female.  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  Patient complains of belching but denies any vomiting. .  Patient denies any recent history of exertional chest pain, nausea, vomiting or diaphoresis.  Denies recently using any nitroglycerin.  Denies palpitation, lightheadedness or syncope.  Denies PND, orthopnea but complains ofleft leg swelling.  [place] the [place].workup in the ED showed possible sigmoid colon adenoma carcinoma with obstruction.  EKG done in the ED showed sinus tachycardia with poor R-wave progression in anterior leads.  No acute ischemic changes are noted but patient was noted to have minimally elevated troponin I of 0.61.  Past Medical History  Diagnosis Date  . Allergic rhinitis, cause unspecified   . Unspecified cardiovascular disease   . Personal history of malignant neoplasm of breast   . Occlusion and stenosis of carotid artery without mention of cerebral infarction   . Chronic airway obstruction, not elsewhere classified   . Depressive disorder, not elsewhere classified   . Other dyspnea and respiratory abnormality   . Esophageal reflux   . Unspecified hearing loss    . Other and unspecified hyperlipidemia   . Unspecified essential hypertension   . Osteoporosis, unspecified   . Peripheral vascular disease, unspecified   . Unspecified urinary incontinence   . Spinal stenosis   . ACE-inhibitor cough   . Angina   . Heart murmur     aS CHILD  . Shortness of breath   . Recurrent upper respiratory infection (URI)   . Anxiety   . Pneumonia   . 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 Myocardial infarct   . Osteoarthrosis, unspecified whether generalized or localized, unspecified site   . Malignant neoplasm of breast (female), unspecified site   . Compression fracture of T12 vertebra 10/28/2011    Past Surgical History  Procedure Laterality Date  . Mastectomy  1987    Right  . Breast reconstruction  1998    Reconstruction   . Reduction mammaplasty  1998    Left  . Mastoidectomy  childhood  . Appendectomy  1948  . Lumbar laminectomy  2003  . Shoulder surgery  02/2005    Bilateral fractures with multiple surgeries  . Breast implant removal  06/12/09    right  . Abdominal hysterectomy    . Fracture surgery      fracture right elbow  . Coronary angioplasty  11/12    distal RCA  . Mastectomy    . Left heart catheterization with coronary angiogram N/A 06/05/2011    Procedure: LEFT HEART CATHETERIZATION WITH CORONARY ANGIOGRAM;  Surgeon: Clent Demark, MD;  Location: [facility] CATH LAB;  Service: Cardiovascular;  Laterality: N/A;  . Left heart catheterization with coronary angiogram N/A 03/05/2014    Procedure: LEFT HEART CATHETERIZATION WITH CORONARY ANGIOGRAM;  Surgeon:  Clent Demark, MD;  Location: [facility] CATH LAB;  Service: Cardiovascular;  Laterality: N/A;    Family History  Problem Relation Age of Onset  . Heart attack Father 73  . Cancer Mother     ovarian  . Cancer Brother     lung  . Arthritis      family    Social History:  reports that she quit smoking about 40 years ago. She has never used smokeless tobacco. She  reports that she drinks alcohol. She reports that she does not use illicit drugs.  Allergies:  Allergies  Allergen Reactions  . Amoxicillin-Pot Clavulanate Anaphylaxis  . Cephalexin Other (See Comments)    Reaction unknown  . Clarithromycin Other (See Comments)    Reaction unknown  . Lidocaine   . Nifedipine Other (See Comments)    Reaction unknown  . Nsaids Other (See Comments)    Heart issue  . Olmesartan Medoxomil     REACTION: cough;  But tolerating Losartan 02/2014  . Penicillins   . Tramadol Hcl Other (See Comments)    Reaction unknown    Medications: I have reviewed the patient's current medications.  Results for orders placed or performed during the hospital encounter of 01/18/15 (from the past 48 hour(s))  BNP (order ONLY if patient complains of dyspnea/SOB AND you have documented it for THIS visit)     Status: Abnormal   Collection Time: 01/18/15 12:28 PM  Result Value Ref Range   B Natriuretic Peptide 1610.0 (H) 0.0 - 100.0 pg/mL  Protime-INR (if pt is taking Coumadin)     Status: None   Collection Time: 01/18/15 12:28 PM  Result Value Ref Range   Prothrombin Time 14.2 11.6 - 15.2 seconds   INR 1.08 0.00 - 1.49  Hepatic function panel     Status: Abnormal   Collection Time: 01/18/15 12:28 PM  Result Value Ref Range   Total Protein 7.1 6.5 - 8.1 g/dL   Albumin 4.0 3.5 - 5.0 g/dL   AST 28 15 - 41 U/L   ALT 14 14 - 54 U/L   Alkaline Phosphatase 52 38 - 126 U/L   Total Bilirubin 1.3 (H) 0.3 - 1.2 mg/dL   Bilirubin, Direct 0.2 0.1 - 0.5 mg/dL   Indirect Bilirubin 1.1 (H) 0.3 - 0.9 mg/dL  Lipase, blood     Status: Abnormal   Collection Time: 01/18/15 12:28 PM  Result Value Ref Range   Lipase 20 (L) 22 - 51 U/L  CBC with Differential/Platelet     Status: Abnormal   Collection Time: 01/18/15 12:28 PM  Result Value Ref Range   WBC 8.7 4.0 - 10.5 K/uL   RBC 3.73 (L) 3.87 - 5.11 MIL/uL   Hemoglobin 12.1 12.0 - 15.0 g/dL   HCT 35.3 (L) 36.0 - 46.0 %   MCV 94.6  78.0 - 100.0 fL   MCH 32.4 26.0 - 34.0 pg   MCHC 34.3 30.0 - 36.0 g/dL   RDW 12.4 11.5 - 15.5 %   Platelets 238 150 - 400 K/uL   Neutrophils Relative % 90 (H) 43 - 77 %   Neutro Abs 7.8 (H) 1.7 - 7.7 K/uL   Lymphocytes Relative 5 (L) 12 - 46 %   Lymphs Abs 0.5 (L) 0.7 - 4.0 K/uL   Monocytes Relative 5 3 - 12 %   Monocytes Absolute 0.4 0.1 - 1.0 K/uL   Eosinophils Relative 0 0 - 5 %   Eosinophils Absolute 0.0 0.0 - 0.7 K/uL  Basophils Relative 0 0 - 1 %   Basophils Absolute 0.0 0.0 - 0.1 K/uL  I-stat troponin, ED  (not at [facility], [facility])     Status: Abnormal   Collection Time: 01/18/15 12:41 PM  Result Value Ref Range   Troponin i, poc 0.61 (HH) 0.00 - 0.08 ng/mL   Comment NOTIFIED PHYSICIAN    Comment 3            Comment: Due to the release kinetics of cTnI, a negative result within the first hours of the onset of symptoms does not rule out myocardial infarction with certainty. If myocardial infarction is still suspected, repeat the test at appropriate intervals.   I-stat chem 8, ed     Status: Abnormal   Collection Time: 01/18/15 12:43 PM  Result Value Ref Range   Sodium 128 (L) 135 - 145 mmol/L   Potassium 3.9 3.5 - 5.1 mmol/L   Chloride 97 (L) 101 - 111 mmol/L   BUN 34 (H) 6 - 20 mg/dL   Creatinine, Ser 1.10 (H) 0.44 - 1.00 mg/dL   Glucose, Bld 151 (H) 65 - 99 mg/dL   Calcium, Ion 1.06 (L) 1.13 - 1.30 mmol/L   TCO2 16 0 - 100 mmol/L   Hemoglobin 13.6 12.0 - 15.0 g/dL   HCT 40.0 36.0 - 46.0 %  Urinalysis, Routine w reflex microscopic (not at [facility])     Status: Abnormal   Collection Time: 01/18/15  1:12 PM  Result Value Ref Range   Color, Urine AMBER (A) YELLOW    Comment: BIOCHEMICALS MAY BE AFFECTED BY COLOR   APPearance CLEAR CLEAR   Specific Gravity, Urine 1.018 1.005 - 1.030   pH 5.0 5.0 - 8.0   Glucose, UA NEGATIVE NEGATIVE mg/dL   Hgb urine dipstick NEGATIVE NEGATIVE   Bilirubin Urine MODERATE (A) NEGATIVE   Ketones, ur 40 (A) NEGATIVE mg/dL   Protein, ur  NEGATIVE NEGATIVE mg/dL   Urobilinogen, UA 1.0 0.0 - 1.0 mg/dL   Nitrite NEGATIVE NEGATIVE   Leukocytes, UA NEGATIVE NEGATIVE    Comment: MICROSCOPIC NOT DONE ON URINES WITH NEGATIVE PROTEIN, BLOOD, LEUKOCYTES, NITRITE, OR GLUCOSE <1000 mg/dL.    Dg Chest 2 View  01/18/2015   CLINICAL DATA:  Increasing shortness of breath with exertion  EXAM: CHEST  2 VIEW  COMPARISON:  March 03, 2014  FINDINGS: There is mild scarring in the left base region. There is no edema or consolidation. Heart is upper normal in size with pulmonary vascularity within normal limits. There is atherosclerotic change in the aorta. No adenopathy.  There are wedge compression fractures in the mid and lower thoracic spine with increase in kyphosis. There is evidence of old fractures of each proximal humeral metaphysis, stable. There is calcification in the left carotid artery.  IMPRESSION: Scarring left base. No edema or consolidation. No change in cardiac silhouette. Old fractures as noted. Increase in thoracic kyphosis. Calcification left carotid artery.   Electronically Signed   By: Lowella Grip III M.D.   On: 01/18/2015 14:01   Ct Abdomen Pelvis W Contrast  01/18/2015   CLINICAL DATA:  Lower abdominal pain, swelling, and cramping since yesterday.  EXAM: CT ABDOMEN AND PELVIS WITH CONTRAST  TECHNIQUE: Multidetector CT imaging of the abdomen and pelvis was performed using the standard protocol following bolus administration of intravenous contrast.  CONTRAST:  65mL OMNIPAQUE IOHEXOL 300 MG/ML  SOLN  COMPARISON:  None.  FINDINGS: Lower Chest: Cardiomegaly and mild dependent bibasilar atelectasis noted.  Hepatobiliary: Probable  tiny sub-cm hepatic cysts noted, but no definite liver masses are identified. Gallbladder is unremarkable.  Pancreas: No mass, inflammatory changes, or other significant abnormality identified.  Spleen:  Within normal limits in size and appearance.  Adrenals: 3.1 cm right adrenal mass is seen which has  nonspecific features and enhancement characteristics.  Kidneys/Urinary Tract:  No evidence of masses or hydronephrosis.  Stomach/Bowel/Peritoneum: Small hiatal hernia noted. There is diffuse colonic dilatation. Abrupt transition with shoulder ring is seen in the mid sigmoid colon where there is evidence of annular colonic wall thickening measuring approximately 3.9 cm in length. This is highly suspicious for obstructing colon carcinoma. Sigmoid diverticulosis is demonstrated, however there is no evidence of diverticulitis. No evidence of small bowel obstruction.  Vascular/Lymphatic: No pathologically enlarged lymph nodes identified. 8 mm left common iliac lymph node noted on image 49/ series 201, which is not considered pathologically enlarged. No abdominal aortic aneurysm or other significant retroperitoneal abnormality demonstrated.  Reproductive: Prior hysterectomy noted. Adnexal regions are unremarkable in appearance. Tiny amount of free fluid seen in the dependent pelvis.  Other:  None.  Musculoskeletal: No suspicious bone lesions identified. Lower lumbar spine fusion hardware and old T12 vertebral body compression fracture noted.  IMPRESSION: Distal colonic obstruction in the mid sigmoid colon, with appearance highly suspicious for obstructing colon adenocarcinoma. Sigmoidoscopy or colonoscopy recommended for further evaluation.  Sigmoid diverticulosis. No radiographic evidence of diverticulitis.  No definite evidence of metastatic disease within the abdomen or pelvis.  3.1 cm indeterminate right adrenal mass and probable tiny hepatic cysts. Abdomen MRI without and with contrast recommended for further evaluation.   Electronically Signed   By: Earle Gell M.D.   On: 01/18/2015 15:15    Review of Systems  Constitutional: Negative for fever and chills.  Eyes: Negative for double vision and photophobia.  Respiratory: Positive for shortness of breath. Negative for cough and hemoptysis.   Cardiovascular:  Positive for chest pain and leg swelling (left leg swelling noted). Negative for palpitations and orthopnea.  Gastrointestinal: Positive for vomiting and abdominal pain. Negative for nausea and blood in stool.  Neurological: Negative for dizziness and headaches.   Blood pressure 114/60, pulse 95, temperature 99 F (37.2 C), temperature source Oral, resp. rate 18, height 5\' 6"  (1.676 m), SpO2 92 %. Physical Exam  Constitutional: She is oriented to person, place, and time.  Eyes: Conjunctivae are normal. Left eye exhibits no discharge. No scleral icterus.  Neck: Normal range of motion. Neck supple. No JVD present. No tracheal deviation present. No thyromegaly present.  Cardiovascular: Normal rate and regular rhythm.   Murmur (soft systolic murmur noted) heard. Respiratory:  Decreased breath sounds at the bases  GI:  Distended.  Bowel sounds faint.  Mild generalized tenderness noted.  No guarding  Musculoskeletal:  No clubbing, cyanosis.  Left leg edema noted  Neurological: She is alert and oriented to person, place, and time.    Assessment/Plan: Possible acute coronary syndrome with atypical presentation with minimally elevated troponin I.  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 Ischemic cardiomyopathy. Hypertension. Hypercholesterolemia. COPD History of cerebrovascular disease. History of CVA of breast. Spinal stenosis. Vitamin D deficiency. Osteoporosis. History of compression fracture of thoracic spine with kyphosis Left leg swelling, rule out DVT. Plan Check serial enzymes and EKG Agree with IV heparin for now. Check 2-D echo to check LV function and wall motion abnormalities.  Continue aspirin, low-dose beta blocker, nitrates, statins. Hold clopidogrel.  For now, unless cardiac enzymes are trending up, and pending GI evaluation Charolette Forward 01/18/2015, 4:37 PM

## 2015-01-18 NOTE — Progress Notes (Signed)
Patient arrived to 2C18 from ED. NGT in place. Heparin gtt at 12.5.

## 2015-01-18 NOTE — ED Notes (Signed)
MD at bedside. 

## 2015-01-18 NOTE — ED Provider Notes (Signed)
CSN: [id number]     Arrival date & time 01/18/15  1201 History   First MD Initiated Contact with Patient 01/18/15 1211     Chief Complaint  Patient presents with  . Shortness of Breath  . Abdominal Pain     (Consider location/radiation/quality/duration/timing/severity/associated sxs/prior Treatment) Patient is a 81 y.o. female presenting with abdominal pain. The history is provided by the patient (the pt complains of abd pain for a few days with sob today).  Abdominal Pain Pain location:  Generalized Pain quality: aching   Pain radiates to:  Does not radiate Pain severity:  Moderate Onset quality:  Gradual Timing:  Constant Progression:  Worsening Chronicity:  New Associated symptoms: shortness of breath   Associated symptoms: no chest pain, no cough, no diarrhea, no fatigue and no hematuria     Past Medical History  Diagnosis Date  . Allergic rhinitis, cause unspecified   . Unspecified cardiovascular disease   . Personal history of malignant neoplasm of breast   . Occlusion and stenosis of carotid artery without mention of cerebral infarction   . Chronic airway obstruction, not elsewhere classified   . Depressive disorder, not elsewhere classified   . Other dyspnea and respiratory abnormality   . Esophageal reflux   . Unspecified hearing loss   . Other and unspecified hyperlipidemia   . Unspecified essential hypertension   . Osteoporosis, unspecified   . Peripheral vascular disease, unspecified   . Unspecified urinary incontinence   . Spinal stenosis   . ACE-inhibitor cough   . Angina   . Heart murmur     aS CHILD  . Shortness of breath   . Recurrent upper respiratory infection (URI)   . Anxiety   . Pneumonia   . 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 Myocardial infarct   . Osteoarthrosis, unspecified whether generalized or localized, unspecified site   . Malignant neoplasm of breast (female), unspecified site   . Compression fracture of T12  vertebra 10/28/2011   Past Surgical History  Procedure Laterality Date  . Mastectomy  1987    Right  . Breast reconstruction  1998    Reconstruction   . Reduction mammaplasty  1998    Left  . Mastoidectomy  childhood  . Appendectomy  1948  . Lumbar laminectomy  2003  . Shoulder surgery  02/2005    Bilateral fractures with multiple surgeries  . Breast implant removal  06/12/09    right  . Abdominal hysterectomy    . Fracture surgery      fracture right elbow  . Coronary angioplasty  11/12    distal RCA  . Mastectomy    . Left heart catheterization with coronary angiogram N/A 06/05/2011    Procedure: LEFT HEART CATHETERIZATION WITH CORONARY ANGIOGRAM;  Surgeon: Clent Demark, MD;  Location: [facility] CATH LAB;  Service: Cardiovascular;  Laterality: N/A;  . Left heart catheterization with coronary angiogram N/A 03/05/2014    Procedure: LEFT HEART CATHETERIZATION WITH CORONARY ANGIOGRAM;  Surgeon: Clent Demark, MD;  Location: [facility] CATH LAB;  Service: Cardiovascular;  Laterality: N/A;   Family History  Problem Relation Age of Onset  . Heart attack Father 64  . Cancer Mother     ovarian  . Cancer Brother     lung  . Arthritis      family   History  Substance Use Topics  . Smoking status: Former Smoker    Quit date: 07/13/1974  . Smokeless tobacco: Never Used  .  Alcohol Use: 0.0 oz/week    0 Standard drinks or equivalent per week     Comment: occasional   OB History    No data available     Review of Systems  Constitutional: Negative for appetite change and fatigue.  HENT: Negative for congestion, ear discharge and sinus pressure.   Eyes: Negative for discharge.  Respiratory: Positive for shortness of breath. Negative for cough.   Cardiovascular: Negative for chest pain.  Gastrointestinal: Positive for abdominal pain. Negative for diarrhea.  Genitourinary: Negative for frequency and hematuria.  Musculoskeletal: Negative for back pain.  Skin: Negative for rash.   Neurological: Negative for seizures and headaches.  Psychiatric/Behavioral: Negative for hallucinations.      Allergies  Amoxicillin-pot clavulanate; Cephalexin; Clarithromycin; Lidocaine; Nifedipine; Nsaids; Olmesartan medoxomil; Penicillins; and Tramadol hcl  Home Medications   Prior to Admission medications   Medication Sig Start Date End Date Taking? Authorizing Provider  albuterol (PROVENTIL) (2.5 MG/3ML) 0.083% nebulizer solution Take 3 mLs (2.5 mg total) by nebulization every 6 (six) hours as needed for wheezing or shortness of breath. 07/30/14   Venia Carbon, MD  aspirin 81 MG tablet Take 81 mg by mouth daily after lunch.     Historical Provider, MD  Calcium Carbonate-Vitamin D (CALTRATE 600+D PO) Take 1 tablet by mouth daily after lunch.     Historical Provider, MD  carvedilol (COREG) 12.5 MG tablet Take 1 tablet (12.5 mg total) by mouth 2 (two) times daily with a meal. 07/23/14   Venia Carbon, MD  cetirizine (ZYRTEC) 10 MG tablet Take 10 mg by mouth daily. For allergies    Historical Provider, MD  clopidogrel (PLAVIX) 75 MG tablet Take 1 tablet (75 mg total) by mouth daily with breakfast. 07/23/14   Venia Carbon, MD  denosumab (PROLIA) 60 MG/ML SOLN injection Inject 60 mg into the skin every 6 (six) months. Administer in upper arm, thigh, or abdomen    Historical Provider, MD  escitalopram (LEXAPRO) 10 MG tablet Take 1 tablet (10 mg total) by mouth daily. 10/02/14   Venia Carbon, MD  famotidine (PEPCID) 20 MG tablet Take 1 tablet (20 mg total) by mouth 2 (two) times daily. 07/23/14   Venia Carbon, MD  fluticasone (FLONASE) 50 MCG/ACT nasal spray Place 2 sprays into both nostrils daily. 07/23/14   Venia Carbon, MD  furosemide (LASIX) 40 MG tablet Take 1 tablet (40 mg total) by mouth daily. 07/23/14   Venia Carbon, MD  gabapentin (NEURONTIN) 100 MG capsule Take 1 capsule (100 mg total) by mouth 2 (two) times daily. 07/23/14   Venia Carbon, MD  losartan  (COZAAR) 50 MG tablet TAKE 1 TABLET BY MOUTH DAILY 07/26/14   Venia Carbon, MD  montelukast (SINGULAIR) 10 MG tablet TAKE 1 TABLET BY MOUTH DAILY 07/26/14   Venia Carbon, MD  Multiple Vitamin (MULITIVITAMIN WITH MINERALS) TABS Take 1 tablet by mouth daily after lunch.     Historical Provider, MD  oxyCODONE-acetaminophen (PERCOCET) 5-325 MG per tablet Take 2 tablets by mouth every 4 (four) hours as needed for pain.     Delos Haring, PA-C  pravastatin (PRAVACHOL) 80 MG tablet Take 1 tablet by mouth  daily 12/14/14   Venia Carbon, MD  spironolactone (ALDACTONE) 25 MG tablet Take 1 tablet (25 mg total) by mouth daily. 07/23/14   Venia Carbon, MD  vitamin B-12 (CYANOCOBALAMIN) 500 MCG tablet Take 1,000 mcg by mouth 3 (three) times a week. [place],  Wed, Fri    Historical Provider, MD   BP 109/70 mmHg  Pulse 95  Temp(Src) 99 F (37.2 C) (Oral)  Resp 14  Ht 5\' 6"  (1.676 m)  SpO2 94% Physical Exam  Constitutional: She is oriented to person, place, and time. She appears well-developed.  HENT:  Head: Normocephalic.  Eyes: Conjunctivae and EOM are normal. No scleral icterus.  Neck: Neck supple. No thyromegaly present.  Cardiovascular: Normal rate and regular rhythm.  Exam reveals no gallop and no friction rub.   No murmur heard. Pulmonary/Chest: No stridor. She has no wheezes. She has no rales. She exhibits no tenderness.  Abdominal: She exhibits distension. There is tenderness. There is no rebound.  Musculoskeletal: Normal range of motion. She exhibits no edema.  Lymphadenopathy:    She has no cervical adenopathy.  Neurological: She is oriented to person, place, and time. She exhibits normal muscle tone. Coordination normal.  Skin: No rash noted. No erythema.  Psychiatric: She has a normal mood and affect. Her behavior is normal.    ED Course  Procedures (including critical care time) Labs Review Labs Reviewed  BRAIN NATRIURETIC PEPTIDE - Abnormal; Notable for the following:    B  Natriuretic Peptide 1610.0 (*)    All other components within normal limits  HEPATIC FUNCTION PANEL - Abnormal; Notable for the following:    Total Bilirubin 1.3 (*)    Indirect Bilirubin 1.1 (*)    All other components within normal limits  LIPASE, BLOOD - Abnormal; Notable for the following:    Lipase 20 (*)    All other components within normal limits  CBC WITH DIFFERENTIAL/PLATELET - Abnormal; Notable for the following:    RBC 3.73 (*)    HCT 35.3 (*)    Neutrophils Relative % 90 (*)    Neutro Abs 7.8 (*)    Lymphocytes Relative 5 (*)    Lymphs Abs 0.5 (*)    All other components within normal limits  URINALYSIS, ROUTINE W REFLEX MICROSCOPIC (NOT AT [facility]) - Abnormal; Notable for the following:    Color, Urine AMBER (*)    Bilirubin Urine MODERATE (*)    Ketones, ur 40 (*)    All other components within normal limits  I-STAT TROPOININ, ED - Abnormal; Notable for the following:    Troponin i, poc 0.61 (*)    All other components within normal limits  I-STAT CHEM 8, ED - Abnormal; Notable for the following:    Sodium 128 (*)    Chloride 97 (*)    BUN 34 (*)    Creatinine, Ser 1.10 (*)    Glucose, Bld 151 (*)    Calcium, Ion 1.06 (*)    All other components within normal limits  PROTIME-INR    Imaging Review Dg Chest 2 View  01/18/2015   CLINICAL DATA:  Increasing shortness of breath with exertion  EXAM: CHEST  2 VIEW  COMPARISON:  March 03, 2014  FINDINGS: There is mild scarring in the left base region. There is no edema or consolidation. Heart is upper normal in size with pulmonary vascularity within normal limits. There is atherosclerotic change in the aorta. No adenopathy.  There are wedge compression fractures in the mid and lower thoracic spine with increase in kyphosis. There is evidence of old fractures of each proximal humeral metaphysis, stable. There is calcification in the left carotid artery.  IMPRESSION: Scarring left base. No edema or consolidation. No change in  cardiac silhouette. Old fractures as noted. Increase in  thoracic kyphosis. Calcification left carotid artery.   Electronically Signed   By: Lowella Grip III M.D.   On: 01/18/2015 14:01   Ct Abdomen Pelvis W Contrast  01/18/2015   CLINICAL DATA:  Lower abdominal pain, swelling, and cramping since yesterday.  EXAM: CT ABDOMEN AND PELVIS WITH CONTRAST  TECHNIQUE: Multidetector CT imaging of the abdomen and pelvis was performed using the standard protocol following bolus administration of intravenous contrast.  CONTRAST:  57mL OMNIPAQUE IOHEXOL 300 MG/ML  SOLN  COMPARISON:  None.  FINDINGS: Lower Chest: Cardiomegaly and mild dependent bibasilar atelectasis noted.  Hepatobiliary: Probable tiny sub-cm hepatic cysts noted, but no definite liver masses are identified. Gallbladder is unremarkable.  Pancreas: No mass, inflammatory changes, or other significant abnormality identified.  Spleen:  Within normal limits in size and appearance.  Adrenals: 3.1 cm right adrenal mass is seen which has nonspecific features and enhancement characteristics.  Kidneys/Urinary Tract:  No evidence of masses or hydronephrosis.  Stomach/Bowel/Peritoneum: Small hiatal hernia noted. There is diffuse colonic dilatation. Abrupt transition with shoulder ring is seen in the mid sigmoid colon where there is evidence of annular colonic wall thickening measuring approximately 3.9 cm in length. This is highly suspicious for obstructing colon carcinoma. Sigmoid diverticulosis is demonstrated, however there is no evidence of diverticulitis. No evidence of small bowel obstruction.  Vascular/Lymphatic: No pathologically enlarged lymph nodes identified. 8 mm left common iliac lymph node noted on image 49/ series 201, which is not considered pathologically enlarged. No abdominal aortic aneurysm or other significant retroperitoneal abnormality demonstrated.  Reproductive: Prior hysterectomy noted. Adnexal regions are unremarkable in appearance. Tiny  amount of free fluid seen in the dependent pelvis.  Other:  None.  Musculoskeletal: No suspicious bone lesions identified. Lower lumbar spine fusion hardware and old T12 vertebral body compression fracture noted.  IMPRESSION: Distal colonic obstruction in the mid sigmoid colon, with appearance highly suspicious for obstructing colon adenocarcinoma. Sigmoidoscopy or colonoscopy recommended for further evaluation.  Sigmoid diverticulosis. No radiographic evidence of diverticulitis.  No definite evidence of metastatic disease within the abdomen or pelvis.  3.1 cm indeterminate right adrenal mass and probable tiny hepatic cysts. Abdomen MRI without and with contrast recommended for further evaluation.   Electronically Signed   By: Earle Gell M.D.   On: 01/18/2015 15:15     EKG Interpretation   Date/Time:  Friday January 18 2015 12:14:08 EDT Ventricular Rate:  104 PR Interval:  179 QRS Duration: 116 QT Interval:  387 QTC Calculation: 509 R Axis:   155 Text Interpretation:  Sinus tachycardia Atrial premature complex  Nonspecific intraventricular conduction delay Low voltage, precordial  leads Consider anterior infarct Confirmed by Jeanluc Wegman  MD, Broady Lafoy [phone]) on  01/18/2015 12:57:39 PM     CRITICAL CARE Performed by: Abeni Finchum L Total critical care time: 45 Critical care time was exclusive of separately billable procedures and treating other patients. Critical care was necessary to treat or prevent imminent or life-threatening deterioration. Critical care was time spent personally by me on the following activities: development of treatment plan with patient and/or surrogate as well as nursing, discussions with consultants, evaluation of patient's response to treatment, examination of patient, obtaining history from patient or surrogate, ordering and performing treatments and interventions, ordering and review of laboratory studies, ordering and review of radiographic studies, pulse oximetry and  re-evaluation of patient's condition.  MDM   Final diagnoses:  None    Admit for colonic obstruction,   Gi consult, and cards consult for elevated troponin  Milton Ferguson, MD 01/18/15 [phone]

## 2015-01-18 NOTE — H&P (Signed)
[facility] History and Physical                                                                                    Haley Harris, is a 81 y.o. female  MRN: [id number]   DOB - 1928-08-14  Admit Date - 01/18/2015  Outpatient Primary MD for the patient is [name], MD  Referring MD: Roderic Palau / ER  Consulting M.D.: [facility] / Cardiology; [facility] / Gastroenterology  With History of -  Past Medical History  Diagnosis Date  . Allergic rhinitis, cause unspecified   . Unspecified cardiovascular disease   . Personal history of malignant neoplasm of breast   . Occlusion and stenosis of carotid artery without mention of cerebral infarction   . Chronic airway obstruction, not elsewhere classified   . Depressive disorder, not elsewhere classified   . Other dyspnea and respiratory abnormality   . Esophageal reflux   . Unspecified hearing loss   . Other and unspecified hyperlipidemia   . Unspecified essential hypertension   . Osteoporosis, unspecified   . Peripheral vascular disease, unspecified   . Unspecified urinary incontinence   . Spinal stenosis   . ACE-inhibitor cough   . Angina   . Heart murmur     aS CHILD  . Shortness of breath   . Recurrent upper respiratory infection (URI)   . Anxiety   . Pneumonia   . 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 Myocardial infarct   . Osteoarthrosis, unspecified whether generalized or localized, unspecified site   . Malignant neoplasm of breast (female), unspecified site   . Compression fracture of T12 vertebra 10/28/2011      Past Surgical History  Procedure Laterality Date  . Mastectomy  1987    Right  . Breast reconstruction  1998    Reconstruction   . Reduction mammaplasty  1998    Left  . Mastoidectomy  childhood  . Appendectomy  1948  . Lumbar laminectomy  2003  . Shoulder surgery  02/2005    Bilateral fractures with multiple surgeries  . Breast implant removal  06/12/09    right  . Abdominal hysterectomy     . Fracture surgery      fracture right elbow  . Coronary angioplasty  11/12    distal RCA  . Mastectomy    . Left heart catheterization with coronary angiogram N/A 06/05/2011    Procedure: LEFT HEART CATHETERIZATION WITH CORONARY ANGIOGRAM;  Surgeon: Clent Demark, MD;  Location: [facility] CATH LAB;  Service: Cardiovascular;  Laterality: N/A;  . Left heart catheterization with coronary angiogram N/A 03/05/2014    Procedure: LEFT HEART CATHETERIZATION WITH CORONARY ANGIOGRAM;  Surgeon: Clent Demark, MD;  Location: [facility] CATH LAB;  Service: Cardiovascular;  Laterality: N/A;    in for   Chief Complaint  Patient presents with  . Shortness of Breath  . 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 Patient  presented to the ER today because of shortness of breath and abdominal pain. Patient reports the abdominal symptoms began abruptly last night with swelling and discomfort with abdominal pain radiating up into the chest. She describes a bandlike tight pain in the upper abdomen as well as suprapubic discomfort. Her last BM was 2 days ago. She really has not been passing any flatus and has had excessive belching and reflux since onset of symptoms. She became short of breath after the development of the abdominal distention. She reports left lower extremity swelling for about one month. She has been unable to wear her support hose due to the swelling causing discomfort. After onset of abdominal distention she did have dry heaving and has noticed poor oral intake. She has not had any chest pain, palpitations or shortness of breath symptoms prior to onset of abdominal distention. She denies dysuria, hematuria, previous blood or dark stools.  In  the ER patient had a low-grade temperature of 99, she was slightly tachycardic with heart rate of 105, respirations were 17, she was mildly hypertensive initially with a blood pressure 154/99 but after she was given IV morphine for pain and Zofran for nausea her tachycardia decreased with a heart rate now 95 and blood pressure down to 114/60. She's had room air saturations between 92 and 98%. A CT of the abdomen revealed a distal colonic obstruction in the mid sigmoid colon with appearance that is highly suspicious for obstructing colon adenocarcinoma. There was significant colonic distention with air in the colon. There was also an incidental finding of 3.1 cm indeterminate right adrenal mass along with tiny hepatic cysts. Radiologist recommended abdominal MRI with and without contrast for further evaluation. Because of her shortness of breath and history of heart failure/COPD and chest x-ray was ordered but this did not demonstrate any evidence of edema or consolidation. Laboratory data revealed a new hyponatremia with a sodium 128 but BUN and creatinine at baseline of 34/1.10. BNP was slightly elevated at 1600 and troponin was mildly elevated at 0.61, lipase was normal at 20. LFTs were normal except for mild elevation in total bilirubin at 1.3 with an indirect bilirubin of 1.1 direct bilirubin 0.2. CBC was normal except for elevated neutrophils 90%. Urinalysis was abnormal with moderate bilirubin Amber color and 40 ketones but no evidence of UTI. Because of the obstructing mass seen on CT, EDP/Dr. Roderic Palau has called the patient's primary gastroenterologist Dr. Earlean Shawl will come see the patient during the hospitalization. Because of the abnormal troponin the EDP also notified the patient's cardiologist Dr. Terrence Dupont of need for consultation.   Review of Systems   In addition to the HPI above,  No Fever-chills, myalgias or other constitutional symptoms No Headache, changes with Vision or hearing, new weakness,  tingling, numbness in any extremity, No problems swallowing food or Liquids No Chest pain, Cough, palpitations, orthopnea or DOE No melena or hematochezia, no dark tarry stools No dysuria, hematuria or flank pain No new skin rashes, lesions, masses or bruises, No new joints pains-aches No recent weight gain or loss No polyuria, polydypsia or polyphagia,  *A full 10 point Review of Systems was done, except as stated above, all other Review of Systems were negative.  Social History History  Substance Use Topics  . Smoking status: Former Smoker    Quit date: 07/13/1974  . Smokeless tobacco: Never Used  . Alcohol Use:  typically will drink either a glass of wine or a mixed beverage containing 1 shot of vodka daily.  Typically will give up all alcohol with a week of land and has never experienced withdrawal symptoms.              Resides at: Private residence  Lives with: Loan  Ambulatory status: Out assistive devices   Family History Family History  Problem Relation Age of Onset  . Heart attack Father 53  . Cancer Mother     ovarian  . Cancer Brother     lung  . Arthritis      family     Prior to Admission medications   Medication Sig Start Date End Date Taking? Authorizing Provider  albuterol (PROVENTIL) (2.5 MG/3ML) 0.083% nebulizer solution Take 3 mLs (2.5 mg total) by nebulization every 6 (six) hours as needed for wheezing or shortness of breath. 07/30/14  Yes Venia Carbon, MD  aspirin 81 MG tablet Take 81 mg by mouth daily after lunch.    Yes Historical Provider, MD  Calcium Carbonate-Vitamin D (CALTRATE 600+D PO) Take 1 tablet by mouth daily after lunch.    Yes Historical Provider, MD  carvedilol (COREG) 12.5 MG tablet Take 1 tablet (12.5 mg total) by mouth 2 (two) times daily with a meal. 07/23/14  Yes Venia Carbon, MD  cetirizine (ZYRTEC) 10 MG tablet Take 10 mg by mouth daily. For allergies   Yes Historical Provider, MD  clopidogrel (PLAVIX) 75 MG tablet  Take 1 tablet (75 mg total) by mouth daily with breakfast. 07/23/14  Yes Venia Carbon, MD  denosumab (PROLIA) 60 MG/ML SOLN injection Inject 60 mg into the skin every 6 (six) months. Administer in upper arm, thigh, or abdomen   Yes Historical Provider, MD  escitalopram (LEXAPRO) 10 MG tablet Take 1 tablet (10 mg total) by mouth daily. 10/02/14  Yes Venia Carbon, MD  famotidine (PEPCID) 20 MG tablet Take 1 tablet (20 mg total) by mouth 2 (two) times daily. 07/23/14  Yes Venia Carbon, MD  fluticasone (FLONASE) 50 MCG/ACT nasal spray Place 2 sprays into both nostrils daily. 07/23/14  Yes Venia Carbon, MD  furosemide (LASIX) 40 MG tablet Take 1 tablet (40 mg total) by mouth daily. 07/23/14  Yes Venia Carbon, MD  gabapentin (NEURONTIN) 100 MG capsule Take 1 capsule (100 mg total) by mouth 2 (two) times daily. 07/23/14  Yes Venia Carbon, MD  losartan (COZAAR) 50 MG tablet TAKE 1 TABLET BY MOUTH DAILY 07/26/14  Yes Venia Carbon, MD  montelukast (SINGULAIR) 10 MG tablet TAKE 1 TABLET BY MOUTH DAILY 07/26/14  Yes Venia Carbon, MD  Multiple Vitamin (MULITIVITAMIN WITH MINERALS) TABS Take 1 tablet by mouth daily after lunch.    Yes Historical Provider, MD  oxyCODONE-acetaminophen (PERCOCET) 5-325 MG per tablet Take 2 tablets by mouth every 4 (four) hours as needed for pain.    Yes Delos Haring, PA-C  spironolactone (ALDACTONE) 25 MG tablet Take 1 tablet (25 mg total) by mouth daily. 07/23/14  Yes Venia Carbon, MD  vitamin B-12 (CYANOCOBALAMIN) 500 MCG tablet Take 1,000 mcg by mouth 3 (three) times a week. Mon, Wed, Fri   Yes Historical Provider, MD  pravastatin (PRAVACHOL) 80 MG tablet Take 1 tablet by mouth  daily 12/14/14   Venia Carbon, MD    Allergies  Allergen Reactions  . Amoxicillin-Pot Clavulanate Anaphylaxis  . Cephalexin Other (See Comments)    Reaction unknown  . Clarithromycin Other (See Comments)    Reaction unknown  . Lidocaine   . Nifedipine Other (  See  Comments)    Reaction unknown  . Nsaids Other (See Comments)    Heart issue  . Olmesartan Medoxomil     REACTION: cough;  But tolerating Losartan 02/2014  . Penicillins   . Tramadol Hcl Other (See Comments)    Reaction unknown    Physical Exam  Vitals  Blood pressure 114/60, pulse 95, temperature 99 F (37.2 C), temperature source Oral, resp. rate 18, height 5\' 6"  (1.676 m), SpO2 92 %.   General:  In no acute distress, appears healthy and well nourished  Psych:  Normal affect, Denies Suicidal or Homicidal ideations, Awake Alert, Oriented X 3. Speech and thought patterns are clear and appropriate, no apparent short term memory deficits  Neuro:   No focal neurological deficits, CN II through XII intact, Strength 5/5 all 4 extremities, Sensation intact all 4 extremities.  ENT:  Ears and Eyes appear Normal, Conjunctivae clear, PER. Moist oral mucosa without erythema or exudates.  Neck:  Supple, No lymphadenopathy appreciated  Respiratory:  Symmetrical chest wall movement, decreased air movement bilaterally secondary to mechanical impairment from distended abdomen, CTA without wheezing, rales or rhonchi. Room Air  Cardiac:  RRR, No Murmurs, focal left LE edema noted/nonpitting-no palpable cord, no JVD, No carotid bruits, peripheral pulses palpable at 2+  Abdomen: Hyperactive and occasionally rushing bowel sounds, somewhat tender in upper abdomen without guarding or rebounding; also tender suprapubic area with minimal guarding, quite distended and tympanitic,  No masses appreciated, no obvious hepatosplenomegaly difficult to determine accurately in setting of markedly distended abdomen. Abdomen tympanic  Skin:  No Cyanosis, Normal Skin Turgor, No Skin Rash or Bruise.  Extremities: Asymmetrical setting of unilateral left lower extremity edema without obvious trauma or injury,  no effusions.  Data Review  CBC  Recent Labs Lab 01/18/15 1228 01/18/15 1243  WBC 8.7  --   HGB 12.1  13.6  HCT 35.3* 40.0  PLT 238  --   MCV 94.6  --   MCH 32.4  --   MCHC 34.3  --   RDW 12.4  --   LYMPHSABS 0.5*  --   MONOABS 0.4  --   EOSABS 0.0  --   BASOSABS 0.0  --     Chemistries   Recent Labs Lab 01/18/15 1228 01/18/15 1243  NA  --  128*  K  --  3.9  CL  --  97*  GLUCOSE  --  151*  BUN  --  34*  CREATININE  --  1.10*  AST 28  --   ALT 14  --   ALKPHOS 52  --   BILITOT 1.3*  --     Coagulation profile  Recent Labs Lab 01/18/15 1228  INR 1.08   Urinalysis    Component Value Date/Time   COLORURINE AMBER* 01/18/2015 1312   APPEARANCEUR CLEAR 01/18/2015 1312   LABSPEC 1.018 01/18/2015 1312   PHURINE 5.0 01/18/2015 1312   GLUCOSEU NEGATIVE 01/18/2015 1312   HGBUR NEGATIVE 01/18/2015 1312   BILIRUBINUR MODERATE* 01/18/2015 1312   BILIRUBINUR neg 08/05/2011 1119   KETONESUR 40* 01/18/2015 1312   KETONESUR negative 10/23/2011 1633   PROTEINUR NEGATIVE 01/18/2015 1312   PROTEINUR neg 10/23/2011 1633   UROBILINOGEN 1.0 01/18/2015 1312   UROBILINOGEN negative 10/23/2011 1633   NITRITE NEGATIVE 01/18/2015 1312   NITRITE neg 10/23/2011 1633   LEUKOCYTESUR NEGATIVE 01/18/2015 1312    Imaging results:   Dg Chest 2 View  01/18/2015   CLINICAL DATA:  Increasing shortness of  breath with exertion  EXAM: CHEST  2 VIEW  COMPARISON:  March 03, 2014  FINDINGS: There is mild scarring in the left base region. There is no edema or consolidation. Heart is upper normal in size with pulmonary vascularity within normal limits. There is atherosclerotic change in the aorta. No adenopathy.  There are wedge compression fractures in the mid and lower thoracic spine with increase in kyphosis. There is evidence of old fractures of each proximal humeral metaphysis, stable. There is calcification in the left carotid artery.  IMPRESSION: Scarring left base. No edema or consolidation. No change in cardiac silhouette. Old fractures as noted. Increase in thoracic kyphosis. Calcification  left carotid artery.   Electronically Signed   By: Lowella Grip III M.D.   On: 01/18/2015 14:01   Ct Abdomen Pelvis W Contrast  01/18/2015   CLINICAL DATA:  Lower abdominal pain, swelling, and cramping since yesterday.  EXAM: CT ABDOMEN AND PELVIS WITH CONTRAST  TECHNIQUE: Multidetector CT imaging of the abdomen and pelvis was performed using the standard protocol following bolus administration of intravenous contrast.  CONTRAST:  53mL OMNIPAQUE IOHEXOL 300 MG/ML  SOLN  COMPARISON:  None.  FINDINGS: Lower Chest: Cardiomegaly and mild dependent bibasilar atelectasis noted.  Hepatobiliary: Probable tiny sub-cm hepatic cysts noted, but no definite liver masses are identified. Gallbladder is unremarkable.  Pancreas: No mass, inflammatory changes, or other significant abnormality identified.  Spleen:  Within normal limits in size and appearance.  Adrenals: 3.1 cm right adrenal mass is seen which has nonspecific features and enhancement characteristics.  Kidneys/Urinary Tract:  No evidence of masses or hydronephrosis.  Stomach/Bowel/Peritoneum: Small hiatal hernia noted. There is diffuse colonic dilatation. Abrupt transition with shoulder ring is seen in the mid sigmoid colon where there is evidence of annular colonic wall thickening measuring approximately 3.9 cm in length. This is highly suspicious for obstructing colon carcinoma. Sigmoid diverticulosis is demonstrated, however there is no evidence of diverticulitis. No evidence of small bowel obstruction.  Vascular/Lymphatic: No pathologically enlarged lymph nodes identified. 8 mm left common iliac lymph node noted on image 49/ series 201, which is not considered pathologically enlarged. No abdominal aortic aneurysm or other significant retroperitoneal abnormality demonstrated.  Reproductive: Prior hysterectomy noted. Adnexal regions are unremarkable in appearance. Tiny amount of free fluid seen in the dependent pelvis.  Other:  None.  Musculoskeletal: No  suspicious bone lesions identified. Lower lumbar spine fusion hardware and old T12 vertebral body compression fracture noted.  IMPRESSION: Distal colonic obstruction in the mid sigmoid colon, with appearance highly suspicious for obstructing colon adenocarcinoma. Sigmoidoscopy or colonoscopy recommended for further evaluation.  Sigmoid diverticulosis. No radiographic evidence of diverticulitis.  No definite evidence of metastatic disease within the abdomen or pelvis.  3.1 cm indeterminate right adrenal mass and probable tiny hepatic cysts. Abdomen MRI without and with contrast recommended for further evaluation.   Electronically Signed   By: Earle Gell M.D.   On: 01/18/2015 15:15     EKG: (Independently reviewed) sinus tachycardia with borderline prolonged QTC at 500 ms, slightly widened QRS consistent with nonspecific intraventricular conduction delay   Assessment & Plan  Principal Problem:   Colonic obstruction mid sigmoid -CT appears consistent with obstructing mass -GI/Dr. Earlean Shawl has been consulted by EDP -Given degree of distention and obstructive quality will place NG tube to low wall suction -NPO -Begin D5NS w/ 20 meq KCL at 50 mL per hour -Check CEA -Patient does have retained stool proximal to obstructing lesion which may make prep for  endoscopy difficult; defer this workup and prepped to gastroenterology  Active Problems:   Acute hyponatremia -New problem and suspect related to volume shifting in setting of acute bowel obstruction -Patient was on Lexapro as well as Lasix/Aldactone prior to admission which also could contribute -Holding diuretics in setting of volume depletion and colon obstruction -Check serum and urine osmolality, urine sodium and creatinine; no indications at this juncture for fluid restriction -Utilizing dextrose and saline fluid since nothing by mouth but if sodium continues to drop may need to switch to saline alone -follow BMET    Leg edema, left -Highly  suspicious for underlying DVT especially in setting of obstructing lesion with abdominal distention and likely compression of venous system on the left side originating at abdominal veins -Also concerned given patient for underlying malignancy -Cardiology has stated desire to begin full dose IV heparin in setting of suspected elevated troponin; this will also cover for full DVT treatment -Chek left lower extremity venous duplex -Patient did present with shortness of breath which is likely mechanical in nature and she is not hypoxic, contributing also underlying COPD and kyphosis    Right adrenal mass -Indeterminate in nature and radiologist recommends MRI with and without contrast -Patient has underlying chronic kidney disease so need to monitor renal function closely if contrasted study undertaken -no indication for immediate imaging at this juncture    CAD/elevated troponin -Appreciate cardiologist/Dr. Terrence Dupont assistance -Full dose heparin as above -Cycle cardiac enzymes -Current EKG nonischemic -Nothing by mouth so Plavix and beta blocker on hold; consider scheduled versus prn IV Lopressor -In setting of bowel obstruction acquiring NG tube it is not appropriate to crush meds give per tube and hold suction for 1-2 hours because these medications will not be absorbed -PE can also cause elevated TNI    Chronic combined systolic and diastolic CHF, NYHA class 2 -Based on chest x-ray and current clinical exam patient is compensated -NPO so diuretics and ARB on hold - most recent 2D echo with EF 40-45% - careful with fluids, currently at low rate and will expire in 24 h    CKD , stage III -Current renal function stable and at baseline    Dyslipidemia -Nothing by mouth so preadmission statin on hold    COPD -Currently compensated without evidence of wheezing or hypoxemia -Continue preadmission nebs    Neuropathy due to peripheral vascular disease -Insetting of nothing by mouth  preadmission Neurontin on hold    DVT Prophylaxis: Full dose heparin  Family Communication:  Daughters at bedside   Code Status: Full code   Condition:  Guarded  Discharge disposition: Unknown at this juncture-pending workup for adenocarcinoma-patient may require extensive surgical intervention and may require prolonged rehabilitative phase that may prohibit patient from discharging immediately to home environment       ELLIS,ALLISON L. ANP on 01/18/2015 at 4:47 PM  Between 7am to 7pm - Pager - [phone]  After 7pm go to [email or web address] - password [email or web address]  And look for the night coverage person covering me after hours  [facility]  Patient was seen, examined,treatment plan was discussed with the Advance Practice Provider.  I have directly reviewed the clinical findings, lab, imaging studies and management of this patient in detail. I have made the necessary changes to the above noted documentation, and agree with the documentation, as recorded by the Advance Practice Provider.   Time spent: 70 minutes   Marzetta Board, MD [facility] [phone]

## 2015-01-18 NOTE — Telephone Encounter (Signed)
Patient Name: ERNA BROSSARD  DOB: 11/04/1928    Initial Comment [name] says her mother has had severe ABD pain for the past 20 hours; including now    Nurse Assessment  Nurse: Verlin Fester, RN, [facility]ney Date/Time Eilene Ghazi Time): 01/18/2015 10:27:56 AM  Confirm and document reason for call. If symptomatic, describe symptoms. ---[name] states patient is having severe abdominal pain in the upper and lower abdomen  Has the patient traveled out of the country within the last 30 days? ---No  Does the patient require triage? ---Yes  Related visit to physician within the last 2 weeks? ---No  Does the PT have any chronic conditions? (i.e. diabetes, asthma, etc.) ---Yes  List chronic conditions. ---"multiple health problems"     Guidelines    Guideline Title Affirmed Question Affirmed Notes  Abdominal Pain - Female [1] SEVERE pain (e.g., excruciating) AND [2] present > 1 hour    Final Disposition User   Go to ED Now Verlin Fester, RN, [facility]ney

## 2015-01-18 NOTE — ED Notes (Signed)
Patient transported to CT 

## 2015-01-18 NOTE — Consult Note (Signed)
Referring Provider: No ref. provider found Primary Care Physician:  [name], MD Primary Gastroenterologist:  Dr. Richmond Campbell  Reason for Consultation:  Colonic obstruction  HPI: Haley Harris is a 81 y.o. female who developed the acute onset of  generalized abdominal pain and distention 24 hours ago.  Retching without vomiting.  Last bowel movement yesterday, small volume, with tenesmus.  No fever, rigor, diaphoresis, or chill.  No flatus.  Some improvement since ED admit, receiving Dilaudid, NG decompression initiated.  Significant past GI history of diverticular disease, hospitalized with acute diverticulitis, last time ~25 years ago.  Colonoscopies performed by me in 2000 - no polyps, 2005 - rectosigmoid adenoma, 2008 - right colon adenoma.  Surveillance was then abandoned because of multiple co-morbitiies, including intercurrent CAD with stent placement requiring Plavix therapy.  No recent GI symptoms of concern until obstruction developed.   Past Medical History  Diagnosis Date  . Allergic rhinitis, cause unspecified   . Unspecified cardiovascular disease   . Personal history of malignant neoplasm of breast   . Occlusion and stenosis of carotid artery without mention of cerebral infarction   . Chronic airway obstruction, not elsewhere classified   . Depressive disorder, not elsewhere classified   . Other dyspnea and respiratory abnormality   . Esophageal reflux   . Unspecified hearing loss   . Other and unspecified hyperlipidemia   . Unspecified essential hypertension   . Osteoporosis, unspecified   . Peripheral vascular disease, unspecified   . Unspecified urinary incontinence   . Spinal stenosis   . ACE-inhibitor cough   . Angina   . Heart murmur     aS CHILD  . Shortness of breath   . Recurrent upper respiratory infection (URI)   . Anxiety   . Pneumonia   . 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 Myocardial infarct   . Osteoarthrosis, unspecified  whether generalized or localized, unspecified site   . Malignant neoplasm of breast (female), unspecified site   . Compression fracture of T12 vertebra 10/28/2011    Past Surgical History  Procedure Laterality Date  . Mastectomy  1987    Right  . Breast reconstruction  1998    Reconstruction   . Reduction mammaplasty  1998    Left  . Mastoidectomy  childhood  . Appendectomy  1948  . Lumbar laminectomy  2003  . Shoulder surgery  02/2005    Bilateral fractures with multiple surgeries  . Breast implant removal  06/12/09    right  . Abdominal hysterectomy    . Fracture surgery      fracture right elbow  . Coronary angioplasty  11/12    distal RCA  . Mastectomy    . Left heart catheterization with coronary angiogram N/A 06/05/2011    Procedure: LEFT HEART CATHETERIZATION WITH CORONARY ANGIOGRAM;  Surgeon: Clent Demark, MD;  Location: [facility] CATH LAB;  Service: Cardiovascular;  Laterality: N/A;  . Left heart catheterization with coronary angiogram N/A 03/05/2014    Procedure: LEFT HEART CATHETERIZATION WITH CORONARY ANGIOGRAM;  Surgeon: Clent Demark, MD;  Location: [facility] CATH LAB;  Service: Cardiovascular;  Laterality: N/A;    Prior to Admission medications   Medication Sig Start Date End Date Taking? Authorizing Provider  albuterol (PROVENTIL) (2.5 MG/3ML) 0.083% nebulizer solution Take 3 mLs (2.5 mg total) by nebulization every 6 (six) hours as needed for wheezing or shortness of breath. 07/30/14  Yes Venia Carbon, MD  aspirin 81 MG tablet  Take 81 mg by mouth daily after lunch.    Yes Historical Provider, MD  Calcium Carbonate-Vitamin D (CALTRATE 600+D PO) Take 1 tablet by mouth daily after lunch.    Yes Historical Provider, MD  carvedilol (COREG) 12.5 MG tablet Take 1 tablet (12.5 mg total) by mouth 2 (two) times daily with a meal. 07/23/14  Yes Venia Carbon, MD  cetirizine (ZYRTEC) 10 MG tablet Take 10 mg by mouth daily. For allergies   Yes Historical Provider, MD  clopidogrel  (PLAVIX) 75 MG tablet Take 1 tablet (75 mg total) by mouth daily with breakfast. 07/23/14  Yes Venia Carbon, MD  denosumab (PROLIA) 60 MG/ML SOLN injection Inject 60 mg into the skin every 6 (six) months. Administer in upper arm, thigh, or abdomen   Yes Historical Provider, MD  escitalopram (LEXAPRO) 10 MG tablet Take 1 tablet (10 mg total) by mouth daily. 10/02/14  Yes Venia Carbon, MD  famotidine (PEPCID) 20 MG tablet Take 1 tablet (20 mg total) by mouth 2 (two) times daily. 07/23/14  Yes Venia Carbon, MD  fluticasone (FLONASE) 50 MCG/ACT nasal spray Place 2 sprays into both nostrils daily. 07/23/14  Yes Venia Carbon, MD  furosemide (LASIX) 40 MG tablet Take 1 tablet (40 mg total) by mouth daily. 07/23/14  Yes Venia Carbon, MD  gabapentin (NEURONTIN) 100 MG capsule Take 1 capsule (100 mg total) by mouth 2 (two) times daily. 07/23/14  Yes Venia Carbon, MD  losartan (COZAAR) 50 MG tablet TAKE 1 TABLET BY MOUTH DAILY 07/26/14  Yes Venia Carbon, MD  montelukast (SINGULAIR) 10 MG tablet TAKE 1 TABLET BY MOUTH DAILY 07/26/14  Yes Venia Carbon, MD  Multiple Vitamin (MULITIVITAMIN WITH MINERALS) TABS Take 1 tablet by mouth daily after lunch.    Yes Historical Provider, MD  oxyCODONE-acetaminophen (PERCOCET) 5-325 MG per tablet Take 2 tablets by mouth every 4 (four) hours as needed for pain.    Yes Delos Haring, PA-C  spironolactone (ALDACTONE) 25 MG tablet Take 1 tablet (25 mg total) by mouth daily. 07/23/14  Yes Venia Carbon, MD  vitamin B-12 (CYANOCOBALAMIN) 500 MCG tablet Take 1,000 mcg by mouth 3 (three) times a week. Mon, Wed, Fri   Yes Historical Provider, MD  pravastatin (PRAVACHOL) 80 MG tablet Take 1 tablet by mouth  daily 12/14/14   Venia Carbon, MD    Current Facility-Administered Medications  Medication Dose Route Frequency Provider Last Rate Last Dose  . aspirin EC tablet 81 mg  81 mg Oral Daily Charolette Forward, MD      . atorvastatin (LIPITOR) tablet 20 mg  20  mg Oral q1800 Charolette Forward, MD      . carvedilol (COREG) tablet 3.125 mg  3.125 mg Oral BID WC Charolette Forward, MD      . dextrose 5 % and 0.9 % NaCl with KCl 20 mEq/L infusion   Intravenous Continuous Costin Karlyne Greenspan, MD      . heparin ADULT infusion 100 units/mL (25000 units/250 mL)  1,250 Units/hr Intravenous Continuous Rebecka Apley, RPH      . heparin bolus via infusion 4,000 Units  4,000 Units Intravenous Once Rebecka Apley, [facility]      . morphine 2 MG/ML injection 1 mg  1 mg Intravenous Q2H PRN Samella Parr, NP      . nitroGLYCERIN (NITROGLYN) 2 % ointment 0.5 inch  0.5 inch Topical 3 times per day Charolette Forward, MD      .  ondansetron (ZOFRAN) injection 4 mg  4 mg Intravenous Q6H PRN Samella Parr, NP      . phenol (CHLORASEPTIC) mouth spray 1 spray  1 spray Mouth/Throat PRN Costin Karlyne Greenspan, MD       Current Outpatient Prescriptions  Medication Sig Dispense Refill  . albuterol (PROVENTIL) (2.5 MG/3ML) 0.083% nebulizer solution Take 3 mLs (2.5 mg total) by nebulization every 6 (six) hours as needed for wheezing or shortness of breath. 75 mL 3  . aspirin 81 MG tablet Take 81 mg by mouth daily after lunch.     . Calcium Carbonate-Vitamin D (CALTRATE 600+D PO) Take 1 tablet by mouth daily after lunch.     . carvedilol (COREG) 12.5 MG tablet Take 1 tablet (12.5 mg total) by mouth 2 (two) times daily with a meal. 180 tablet 3  . cetirizine (ZYRTEC) 10 MG tablet Take 10 mg by mouth daily. For allergies    . clopidogrel (PLAVIX) 75 MG tablet Take 1 tablet (75 mg total) by mouth daily with breakfast. 90 tablet 3  . denosumab (PROLIA) 60 MG/ML SOLN injection Inject 60 mg into the skin every 6 (six) months. Administer in upper arm, thigh, or abdomen    . escitalopram (LEXAPRO) 10 MG tablet Take 1 tablet (10 mg total) by mouth daily. 90 tablet 3  . famotidine (PEPCID) 20 MG tablet Take 1 tablet (20 mg total) by mouth 2 (two) times daily. 180 tablet 3  . fluticasone (FLONASE) 50 MCG/ACT nasal spray  Place 2 sprays into both nostrils daily. 48 g 3  . furosemide (LASIX) 40 MG tablet Take 1 tablet (40 mg total) by mouth daily. 90 tablet 3  . gabapentin (NEURONTIN) 100 MG capsule Take 1 capsule (100 mg total) by mouth 2 (two) times daily. 180 capsule 3  . losartan (COZAAR) 50 MG tablet TAKE 1 TABLET BY MOUTH DAILY 90 tablet 3  . montelukast (SINGULAIR) 10 MG tablet TAKE 1 TABLET BY MOUTH DAILY 90 tablet 3  . Multiple Vitamin (MULITIVITAMIN WITH MINERALS) TABS Take 1 tablet by mouth daily after lunch.     . oxyCODONE-acetaminophen (PERCOCET) 5-325 MG per tablet Take 2 tablets by mouth every 4 (four) hours as needed for pain.     Marland Kitchen spironolactone (ALDACTONE) 25 MG tablet Take 1 tablet (25 mg total) by mouth daily. 90 tablet 3  . vitamin B-12 (CYANOCOBALAMIN) 500 MCG tablet Take 1,000 mcg by mouth 3 (three) times a week. Mon, Wed, Fri    . pravastatin (PRAVACHOL) 80 MG tablet Take 1 tablet by mouth  daily 90 tablet 3    Allergies as of 01/18/2015 - Review Complete 01/18/2015  Allergen Reaction Noted  . Amoxicillin-pot clavulanate Anaphylaxis 10/02/2010  . Cephalexin Other (See Comments) 01/18/2007  . Clarithromycin Other (See Comments) 01/18/2007  . Lidocaine  01/18/2015  . Nifedipine Other (See Comments)   . Nsaids Other (See Comments) 10/05/2012  . Olmesartan medoxomil    . Penicillins  01/18/2015  . Tramadol hcl Other (See Comments) 01/18/2007    Family History  Problem Relation Age of Onset  . Heart attack Father 97  . Cancer Mother     ovarian  . Cancer Brother     lung  . Arthritis      family    History   Social History  . Marital Status: Widowed    Spouse Name: N/A  . Number of Children: 2  . Years of Education: N/A   Occupational History  . 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     Social History Main Topics  . Smoking status: Former Smoker    Quit date: 07/13/1974  . Smokeless tobacco: Never Used  . Alcohol Use: 0.0 oz/week    0 Standard drinks  or equivalent per week     Comment: occasional  . Drug Use: No  . Sexual Activity: No   Other Topics Concern  . Not on file   Social History Narrative   Has living will   Son or daughter would be health care POA.   Requests DNR--written 03/02/13   No tube feeds if cognitively unaware    Review of Systems: Positive for: Gen:  Denies any fever, chills, rigors, night sweats, anorexia, fatigue, weakness, malaise, involuntary weight loss, and sleep disorder CV:  Denies chest pain, angina, palpitations, syncope, orthopnea, PND, peripheral edema, and claudication. Resp: Denies dyspnea, cough, sputum, wheezing, coughing up blood. GI: Denies dysphagia, odynophagia, abdominal pain, nausea, vomiting, vomiting blood, jaundice, black stools, rectal bleeding, constipation, diarrhea, and fecal incontinence.    GU : Denies urinary burning, blood in urine, urinary frequency, urinary hesitancy, nocturnal urination, and urinary incontinence. MS: Denies joint pain or swelling.  Denies muscle weakness, cramps, atrophy.  Derm: Denies rash, itching, oral ulcerations, hives, unhealing ulcers.  Psych: Denies depression, anxiety, memory loss, suicidal ideation, hallucinations,  and confusion. Heme: Denies bruising, bleeding, and enlarged lymph nodes. Neuro:  Denies any headaches, dizziness, paresthesias. Endo:  Denies any problems with heat or cold intolerance, polydipsia/polyuria, unusual weight gain.  Physical Exam: Vital signs in last 24 hours: Temp:  [99 F (37.2 C)] 99 F (37.2 C) (07/08 1216) Pulse Rate:  [93-105] 97 (07/08 1759) Resp:  [11-18] 14 (07/08 1759) BP: (100-154)/(57-99) 114/68 mmHg (07/08 1759) SpO2:  [92 %-99 %] 99 % (07/08 1759)   General:   Alert,  Well-developed,obese, pleasant and cooperative.  NG tube in place. Head:  Normocephalic and atraumatic. Eyes:  Sclera clear, no icterus.   Conjunctiva pink. Mouth:   No ulcerations or lesions.  Oropharynx pink & moist. Neck:   No masses  or thyromegaly. Lungs:  Clear throughout to auscultation, decreased breath sounds at bases.   No wheezes, crackles, or rhonchi. No evident respiratory distress. Heart:   Regular rate and rhythm; no murmurs, clicks, rubs,  or gallops. Abdomen:  Tight distention, mild diffuse tenderness, markedly tympanitic.  No masses, hepatosplenomegaly or ventral hernias noted. Rare bowel sounds, without bruits, guarding, or rebound.   Rectal:  Not performed   Msk:   Symmetrical without gross deformities. Pulses:  Normal pulses noted. Extremities:   Without clubbing, cyanosis, or edema. Neurologic:  Alert and coherent;  grossly normal neurologically. Skin:  Intact without significant lesions or rashes. Cervical Nodes:  No significant cervical adenopathy. Psych:   Alert and cooperative. Normal mood and affect.  Intake/Output from previous day:   Intake/Output this shift:    Lab Results:  Recent Labs  01/18/15 1228 01/18/15 1243  WBC 8.7  --   HGB 12.1 13.6  HCT 35.3* 40.0  PLT 238  --    BMET  Recent Labs  01/18/15 1243  NA 128*  K 3.9  CL 97*  GLUCOSE 151*  BUN 34*  CREATININE 1.10*   LFT  Recent Labs  01/18/15 1228  PROT 7.1  ALBUMIN 4.0  AST 28  ALT 14  ALKPHOS 52  BILITOT 1.3*  BILIDIR 0.2  IBILI 1.1*   PT/INR  Recent Labs  01/18/15 1228  LABPROT 14.2  INR 1.08    C-Diff   Studies/Results: Dg Chest 2 View  01/18/2015   CLINICAL DATA:  Increasing shortness of breath with exertion  EXAM: CHEST  2 VIEW  COMPARISON:  March 03, 2014  FINDINGS: There is mild scarring in the left base region. There is no edema or consolidation. Heart is upper normal in size with pulmonary vascularity within normal limits. There is atherosclerotic change in the aorta. No adenopathy.  There are wedge compression fractures in the mid and lower thoracic spine with increase in kyphosis. There is evidence of old fractures of each proximal humeral metaphysis, stable. There is calcification  in the left carotid artery.  IMPRESSION: Scarring left base. No edema or consolidation. No change in cardiac silhouette. Old fractures as noted. Increase in thoracic kyphosis. Calcification left carotid artery.   Electronically Signed   By: Lowella Grip III M.D.   On: 01/18/2015 14:01   Ct Abdomen Pelvis W Contrast  01/18/2015   CLINICAL DATA:  Lower abdominal pain, swelling, and cramping since yesterday.  EXAM: CT ABDOMEN AND PELVIS WITH CONTRAST  TECHNIQUE: Multidetector CT imaging of the abdomen and pelvis was performed using the standard protocol following bolus administration of intravenous contrast.  CONTRAST:  59mL OMNIPAQUE IOHEXOL 300 MG/ML  SOLN  COMPARISON:  None.  FINDINGS: Lower Chest: Cardiomegaly and mild dependent bibasilar atelectasis noted.  Hepatobiliary: Probable tiny sub-cm hepatic cysts noted, but no definite liver masses are identified. Gallbladder is unremarkable.  Pancreas: No mass, inflammatory changes, or other significant abnormality identified.  Spleen:  Within normal limits in size and appearance.  Adrenals: 3.1 cm right adrenal mass is seen which has nonspecific features and enhancement characteristics.  Kidneys/Urinary Tract:  No evidence of masses or hydronephrosis.  Stomach/Bowel/Peritoneum: Small hiatal hernia noted. There is diffuse colonic dilatation. Abrupt transition with shoulder ring is seen in the mid sigmoid colon where there is evidence of annular colonic wall thickening measuring approximately 3.9 cm in length. This is highly suspicious for obstructing colon carcinoma. Sigmoid diverticulosis is demonstrated, however there is no evidence of diverticulitis. No evidence of small bowel obstruction.  Vascular/Lymphatic: No pathologically enlarged lymph nodes identified. 8 mm left common iliac lymph node noted on image 49/ series 201, which is not considered pathologically enlarged. No abdominal aortic aneurysm or other significant retroperitoneal abnormality  demonstrated.  Reproductive: Prior hysterectomy noted. Adnexal regions are unremarkable in appearance. Tiny amount of free fluid seen in the dependent pelvis.  Other:  None.  Musculoskeletal: No suspicious bone lesions identified. Lower lumbar spine fusion hardware and old T12 vertebral body compression fracture noted.  IMPRESSION: Distal colonic obstruction in the mid sigmoid colon, with appearance highly suspicious for obstructing colon adenocarcinoma. Sigmoidoscopy or colonoscopy recommended for further evaluation.  Sigmoid diverticulosis. No radiographic evidence of diverticulitis.  No definite evidence of metastatic disease within the abdomen or pelvis.  3.1 cm indeterminate right adrenal mass and probable tiny hepatic cysts. Abdomen MRI without and with contrast recommended for further evaluation.   Electronically Signed   By: Earle Gell M.D.   On: 01/18/2015 15:15   Dg Abd Portable 1v  01/18/2015   CLINICAL DATA:  Nausea and abdominal pain since yesterday.  EXAM: PORTABLE ABDOMEN - 1 VIEW  COMPARISON:  CT abdomen and pelvis from the same day.  FINDINGS: NG tube is in place. Moderate distention of the colon per cysts, similar to the topogram of the CT scan. There is no definite free air. Contrast can be seen  within the distal small bowel. Surgical changes are noted in the lumbar spine. Atherosclerotic changes are present in the aorta.  IMPRESSION: Similar appearance of distal colonic obstruction.  NG tube in place.   Electronically Signed   By: San Morelle M.D.   On: 01/18/2015 16:57    Impression: Sigmoid obstruction, complete, possibly malignant, as suggested by CT.  Increased risk with prior history of colon adenomas, no colonoscopic surveillance x 8 years.  Other considerations include diverticular disease, volvulus, intussusception.   Most immediately she needs colonic decompression, correction of electrolytes, IVF.  If adequately decompressed by Monday, cautious sigmoidoscopy to diagnose  etiology of blockage will be pursued.  This will be preceded by gentle TWE.  Surgical consultation should be obtained to facilitate probable surgical therapy.    Plan: 1.  NG decompression 2.  NPO except ice chips 3.  Analgesics as needed 4.  Sigmoidoscopy planned for Monday, 01/21/2015 5.  Surgery consult 6.  Call if additional help needed over weekend, [phone].   LOS: 0 days   Torre Pikus R  01/18/2015, 6:13 PM  Referring Provider: No ref. provider found Primary Care Physician:  [name], MD Primary Gastroenterologist:  Dr. Earlean Shawl  Reason for Consultation:    HPI: Haley Harris is a 81 y.o. female    Past Medical History  Diagnosis Date  . Allergic rhinitis, cause unspecified   . Unspecified cardiovascular disease   . Personal history of malignant neoplasm of breast   . Occlusion and stenosis of carotid artery without mention of cerebral infarction   . Chronic airway obstruction, not elsewhere classified   . Depressive disorder, not elsewhere classified   . Other dyspnea and respiratory abnormality   . Esophageal reflux   . Unspecified hearing loss   . Other and unspecified hyperlipidemia   . Unspecified essential hypertension   . Osteoporosis, unspecified   . Peripheral vascular disease, unspecified   . Unspecified urinary incontinence   . Spinal stenosis   . ACE-inhibitor cough   . Angina   . Heart murmur     aS CHILD  . Shortness of breath   . Recurrent upper respiratory infection (URI)   . Anxiety   . Pneumonia   . 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 Myocardial infarct   . Osteoarthrosis, unspecified whether generalized or localized, unspecified site   . Malignant neoplasm of breast (female), unspecified site   . Compression fracture of T12 vertebra 10/28/2011    Past Surgical History  Procedure Laterality Date  . Mastectomy  1987    Right  . Breast reconstruction  1998    Reconstruction   . Reduction mammaplasty  1998     Left  . Mastoidectomy  childhood  . Appendectomy  1948  . Lumbar laminectomy  2003  . Shoulder surgery  02/2005    Bilateral fractures with multiple surgeries  . Breast implant removal  06/12/09    right  . Abdominal hysterectomy    . Fracture surgery      fracture right elbow  . Coronary angioplasty  11/12    distal RCA  . Mastectomy    . Left heart catheterization with coronary angiogram N/A 06/05/2011    Procedure: LEFT HEART CATHETERIZATION WITH CORONARY ANGIOGRAM;  Surgeon: Clent Demark, MD;  Location: [facility] CATH LAB;  Service: Cardiovascular;  Laterality: N/A;  . Left heart catheterization with coronary angiogram N/A 03/05/2014    Procedure: LEFT HEART CATHETERIZATION WITH CORONARY ANGIOGRAM;  Surgeon:  Clent Demark, MD;  Location: [place] CATH LAB;  Service: Cardiovascular;  Laterality: N/A;    Prior to Admission medications   Medication Sig Start Date End Date Taking? Authorizing Provider  albuterol (PROVENTIL) (2.5 MG/3ML) 0.083% nebulizer solution Take 3 mLs (2.5 mg total) by nebulization every 6 (six) hours as needed for wheezing or shortness of breath. 07/30/14  Yes Venia Carbon, MD  aspirin 81 MG tablet Take 81 mg by mouth daily after lunch.    Yes Historical Provider, MD  Calcium Carbonate-Vitamin D (CALTRATE 600+D PO) Take 1 tablet by mouth daily after lunch.    Yes Historical Provider, MD  carvedilol (COREG) 12.5 MG tablet Take 1 tablet (12.5 mg total) by mouth 2 (two) times daily with a meal. 07/23/14  Yes Venia Carbon, MD  cetirizine (ZYRTEC) 10 MG tablet Take 10 mg by mouth daily. For allergies   Yes Historical Provider, MD  clopidogrel (PLAVIX) 75 MG tablet Take 1 tablet (75 mg total) by mouth daily with breakfast. 07/23/14  Yes Venia Carbon, MD  denosumab (PROLIA) 60 MG/ML SOLN injection Inject 60 mg into the skin every 6 (six) months. Administer in upper arm, thigh, or abdomen   Yes Historical Provider, MD  escitalopram (LEXAPRO) 10 MG tablet Take 1 tablet (10 mg  total) by mouth daily. 10/02/14  Yes Venia Carbon, MD  famotidine (PEPCID) 20 MG tablet Take 1 tablet (20 mg total) by mouth 2 (two) times daily. 07/23/14  Yes Venia Carbon, MD  fluticasone (FLONASE) 50 MCG/ACT nasal spray Place 2 sprays into both nostrils daily. 07/23/14  Yes Venia Carbon, MD  furosemide (LASIX) 40 MG tablet Take 1 tablet (40 mg total) by mouth daily. 07/23/14  Yes Venia Carbon, MD  gabapentin (NEURONTIN) 100 MG capsule Take 1 capsule (100 mg total) by mouth 2 (two) times daily. 07/23/14  Yes Venia Carbon, MD  losartan (COZAAR) 50 MG tablet TAKE 1 TABLET BY MOUTH DAILY 07/26/14  Yes Venia Carbon, MD  montelukast (SINGULAIR) 10 MG tablet TAKE 1 TABLET BY MOUTH DAILY 07/26/14  Yes Venia Carbon, MD  Multiple Vitamin (MULITIVITAMIN WITH MINERALS) TABS Take 1 tablet by mouth daily after lunch.    Yes Historical Provider, MD  oxyCODONE-acetaminophen (PERCOCET) 5-325 MG per tablet Take 2 tablets by mouth every 4 (four) hours as needed for pain.    Yes Delos Haring, PA-C  spironolactone (ALDACTONE) 25 MG tablet Take 1 tablet (25 mg total) by mouth daily. 07/23/14  Yes Venia Carbon, MD  vitamin B-12 (CYANOCOBALAMIN) 500 MCG tablet Take 1,000 mcg by mouth 3 (three) times a week. Mon, Wed, Fri   Yes Historical Provider, MD  pravastatin (PRAVACHOL) 80 MG tablet Take 1 tablet by mouth  daily 12/14/14   Venia Carbon, MD    Current Facility-Administered Medications  Medication Dose Route Frequency Provider Last Rate Last Dose  . aspirin EC tablet 81 mg  81 mg Oral Daily Charolette Forward, MD      . atorvastatin (LIPITOR) tablet 20 mg  20 mg Oral q1800 Charolette Forward, MD      . carvedilol (COREG) tablet 3.125 mg  3.125 mg Oral BID WC Charolette Forward, MD      . dextrose 5 % and 0.9 % NaCl with KCl 20 mEq/L infusion   Intravenous Continuous Costin Karlyne Greenspan, MD      . heparin ADULT infusion 100 units/mL (25000 units/250 mL)  1,250 Units/hr Intravenous Continuous Andrey Cota  [place],  [facility]      . heparin bolus via infusion 4,000 Units  4,000 Units Intravenous Once Rebecka Apley, [facility]      . morphine 2 MG/ML injection 1 mg  1 mg Intravenous Q2H PRN Samella Parr, NP      . nitroGLYCERIN (NITROGLYN) 2 % ointment 0.5 inch  0.5 inch Topical 3 times per day Charolette Forward, MD      . ondansetron (ZOFRAN) injection 4 mg  4 mg Intravenous Q6H PRN Samella Parr, NP      . phenol (CHLORASEPTIC) mouth spray 1 spray  1 spray Mouth/Throat PRN Costin Karlyne Greenspan, MD       Current Outpatient Prescriptions  Medication Sig Dispense Refill  . albuterol (PROVENTIL) (2.5 MG/3ML) 0.083% nebulizer solution Take 3 mLs (2.5 mg total) by nebulization every 6 (six) hours as needed for wheezing or shortness of breath. 75 mL 3  . aspirin 81 MG tablet Take 81 mg by mouth daily after lunch.     . Calcium Carbonate-Vitamin D (CALTRATE 600+D PO) Take 1 tablet by mouth daily after lunch.     . carvedilol (COREG) 12.5 MG tablet Take 1 tablet (12.5 mg total) by mouth 2 (two) times daily with a meal. 180 tablet 3  . cetirizine (ZYRTEC) 10 MG tablet Take 10 mg by mouth daily. For allergies    . clopidogrel (PLAVIX) 75 MG tablet Take 1 tablet (75 mg total) by mouth daily with breakfast. 90 tablet 3  . denosumab (PROLIA) 60 MG/ML SOLN injection Inject 60 mg into the skin every 6 (six) months. Administer in upper arm, thigh, or abdomen    . escitalopram (LEXAPRO) 10 MG tablet Take 1 tablet (10 mg total) by mouth daily. 90 tablet 3  . famotidine (PEPCID) 20 MG tablet Take 1 tablet (20 mg total) by mouth 2 (two) times daily. 180 tablet 3  . fluticasone (FLONASE) 50 MCG/ACT nasal spray Place 2 sprays into both nostrils daily. 48 g 3  . furosemide (LASIX) 40 MG tablet Take 1 tablet (40 mg total) by mouth daily. 90 tablet 3  . gabapentin (NEURONTIN) 100 MG capsule Take 1 capsule (100 mg total) by mouth 2 (two) times daily. 180 capsule 3  . losartan (COZAAR) 50 MG tablet TAKE 1 TABLET BY MOUTH DAILY 90 tablet 3  .  montelukast (SINGULAIR) 10 MG tablet TAKE 1 TABLET BY MOUTH DAILY 90 tablet 3  . Multiple Vitamin (MULITIVITAMIN WITH MINERALS) TABS Take 1 tablet by mouth daily after lunch.     . oxyCODONE-acetaminophen (PERCOCET) 5-325 MG per tablet Take 2 tablets by mouth every 4 (four) hours as needed for pain.     Marland Kitchen spironolactone (ALDACTONE) 25 MG tablet Take 1 tablet (25 mg total) by mouth daily. 90 tablet 3  . vitamin B-12 (CYANOCOBALAMIN) 500 MCG tablet Take 1,000 mcg by mouth 3 (three) times a week. Mon, Wed, Fri    . pravastatin (PRAVACHOL) 80 MG tablet Take 1 tablet by mouth  daily 90 tablet 3    Allergies as of 01/18/2015 - Review Complete 01/18/2015  Allergen Reaction Noted  . Amoxicillin-pot clavulanate Anaphylaxis 10/02/2010  . Cephalexin Other (See Comments) 01/18/2007  . Clarithromycin Other (See Comments) 01/18/2007  . Lidocaine  01/18/2015  . Nifedipine Other (See Comments)   . Nsaids Other (See Comments) 10/05/2012  . Olmesartan medoxomil    . Penicillins  01/18/2015  . Tramadol hcl Other (See Comments) 01/18/2007    Family History  Problem Relation  Age of Onset  . Heart attack Father 103  . Cancer Mother     ovarian  . Cancer Brother     lung  . Arthritis      family    History   Social History  . Marital Status: Widowed    Spouse Name: N/A  . Number of Children: 2  . Years of Education: N/A   Occupational History  . 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     Social History Main Topics  . Smoking status: Former Smoker    Quit date: 07/13/1974  . Smokeless tobacco: Never Used  . Alcohol Use: 0.0 oz/week    0 Standard drinks or equivalent per week     Comment: occasional  . Drug Use: No  . Sexual Activity: No   Other Topics Concern  . Not on file   Social History Narrative   Has living will   Son or daughter would be health care POA.   Requests DNR--written 03/02/13   No tube feeds if cognitively unaware    Review of Systems: Positive  for: Gen:  Denies any fever, chills, rigors, night sweats, anorexia, fatigue, weakness, malaise, involuntary weight loss, and sleep disorder CV:  Denies chest pain, angina, palpitations, syncope, orthopnea, PND, peripheral edema, and claudication. Resp: Denies dyspnea, cough, sputum, wheezing, coughing up blood. GI: Denies dysphagia, odynophagia, abdominal pain, nausea, vomiting, vomiting blood, jaundice, black stools, rectal bleeding, constipation, diarrhea, and fecal incontinence.    GU : Denies urinary burning, blood in urine, urinary frequency, urinary hesitancy, nocturnal urination, and urinary incontinence. MS: Denies joint pain or swelling.  Denies muscle weakness, cramps, atrophy.  Derm: Denies rash, itching, oral ulcerations, hives, unhealing ulcers.  Psych: Denies depression, anxiety, memory loss, suicidal ideation, hallucinations,  and confusion. Heme: Denies bruising, bleeding, and enlarged lymph nodes. Neuro:  Denies any headaches, dizziness, paresthesias. Endo:  Denies any problems with heat or cold intolerance, polydipsia/polyuria, unusual weight gain.  Physical Exam: Vital signs in last 24 hours: Temp:  [99 F (37.2 C)] 99 F (37.2 C) (07/08 1216) Pulse Rate:  [93-105] 97 (07/08 1759) Resp:  [11-18] 14 (07/08 1759) BP: (100-154)/(57-99) 114/68 mmHg (07/08 1759) SpO2:  [92 %-99 %] 99 % (07/08 1759)   General:   Alert,  Well-developed, well-nourished, pleasant and cooperative in NAD Head:  Normocephalic and atraumatic. Eyes:  Sclera clear, no icterus.   Conjunctiva pink.   Mouth:   No ulcerations or lesions.  Oropharynx pink & moist. Neck:   No masses or thyromegaly. Lungs:  Clear throughout to auscultation.   No wheezes, crackles, or rhonchi. No evident respiratory distress. Heart:   Regular rate and rhythm; no murmurs, clicks, rubs,  or gallops. Abdomen:  Soft, nontender, nontympanitic, and nondistended. No masses, hepatosplenomegaly or ventral hernias noted. Normal  bowel sounds, without bruits, guarding, or rebound.   Rectal:     Msk:   Symmetrical without gross deformities. Pulses:  Normal pulses noted. Extremities:   Without clubbing, cyanosis, or edema. Neurologic:  Alert and coherent;  grossly normal neurologically. Skin:  Intact without significant lesions or rashes. Cervical Nodes:  No significant cervical adenopathy. Psych:   Alert and cooperative. Normal mood and affect.  Intake/Output from previous day:   Intake/Output this shift:    Lab Results:  Recent Labs  01/18/15 1228 01/18/15 1243  WBC 8.7  --   HGB 12.1 13.6  HCT 35.3* 40.0  PLT 238  --    BMET  Recent Labs  01/18/15 1243  NA 128*  K 3.9  CL 97*  GLUCOSE 151*  BUN 34*  CREATININE 1.10*   LFT  Recent Labs  01/18/15 1228  PROT 7.1  ALBUMIN 4.0  AST 28  ALT 14  ALKPHOS 52  BILITOT 1.3*  BILIDIR 0.2  IBILI 1.1*   PT/INR  Recent Labs  01/18/15 1228  LABPROT 14.2  INR 1.08    C-Diff   Studies/Results: Dg Chest 2 View  01/18/2015   CLINICAL DATA:  Increasing shortness of breath with exertion  EXAM: CHEST  2 VIEW  COMPARISON:  March 03, 2014  FINDINGS: There is mild scarring in the left base region. There is no edema or consolidation. Heart is upper normal in size with pulmonary vascularity within normal limits. There is atherosclerotic change in the aorta. No adenopathy.  There are wedge compression fractures in the mid and lower thoracic spine with increase in kyphosis. There is evidence of old fractures of each proximal humeral metaphysis, stable. There is calcification in the left carotid artery.  IMPRESSION: Scarring left base. No edema or consolidation. No change in cardiac silhouette. Old fractures as noted. Increase in thoracic kyphosis. Calcification left carotid artery.   Electronically Signed   By: Lowella Grip III M.D.   On: 01/18/2015 14:01   Ct Abdomen Pelvis W Contrast  01/18/2015   CLINICAL DATA:  Lower abdominal pain, swelling,  and cramping since yesterday.  EXAM: CT ABDOMEN AND PELVIS WITH CONTRAST  TECHNIQUE: Multidetector CT imaging of the abdomen and pelvis was performed using the standard protocol following bolus administration of intravenous contrast.  CONTRAST:  28mL OMNIPAQUE IOHEXOL 300 MG/ML  SOLN  COMPARISON:  None.  FINDINGS: Lower Chest: Cardiomegaly and mild dependent bibasilar atelectasis noted.  Hepatobiliary: Probable tiny sub-cm hepatic cysts noted, but no definite liver masses are identified. Gallbladder is unremarkable.  Pancreas: No mass, inflammatory changes, or other significant abnormality identified.  Spleen:  Within normal limits in size and appearance.  Adrenals: 3.1 cm right adrenal mass is seen which has nonspecific features and enhancement characteristics.  Kidneys/Urinary Tract:  No evidence of masses or hydronephrosis.  Stomach/Bowel/Peritoneum: Small hiatal hernia noted. There is diffuse colonic dilatation. Abrupt transition with shoulder ring is seen in the mid sigmoid colon where there is evidence of annular colonic wall thickening measuring approximately 3.9 cm in length. This is highly suspicious for obstructing colon carcinoma. Sigmoid diverticulosis is demonstrated, however there is no evidence of diverticulitis. No evidence of small bowel obstruction.  Vascular/Lymphatic: No pathologically enlarged lymph nodes identified. 8 mm left common iliac lymph node noted on image 49/ series 201, which is not considered pathologically enlarged. No abdominal aortic aneurysm or other significant retroperitoneal abnormality demonstrated.  Reproductive: Prior hysterectomy noted. Adnexal regions are unremarkable in appearance. Tiny amount of free fluid seen in the dependent pelvis.  Other:  None.  Musculoskeletal: No suspicious bone lesions identified. Lower lumbar spine fusion hardware and old T12 vertebral body compression fracture noted.  IMPRESSION: Distal colonic obstruction in the mid sigmoid colon, with  appearance highly suspicious for obstructing colon adenocarcinoma. Sigmoidoscopy or colonoscopy recommended for further evaluation.  Sigmoid diverticulosis. No radiographic evidence of diverticulitis.  No definite evidence of metastatic disease within the abdomen or pelvis.  3.1 cm indeterminate right adrenal mass and probable tiny hepatic cysts. Abdomen MRI without and with contrast recommended for further evaluation.   Electronically Signed   By: Earle Gell M.D.   On: 01/18/2015 15:15   Dg  Abd Portable 1v  01/18/2015   CLINICAL DATA:  Nausea and abdominal pain since yesterday.  EXAM: PORTABLE ABDOMEN - 1 VIEW  COMPARISON:  CT abdomen and pelvis from the same day.  FINDINGS: NG tube is in place. Moderate distention of the colon per cysts, similar to the topogram of the CT scan. There is no definite free air. Contrast can be seen within the distal small bowel. Surgical changes are noted in the lumbar spine. Atherosclerotic changes are present in the aorta.  IMPRESSION: Similar appearance of distal colonic obstruction.  NG tube in place.   Electronically Signed   By: San Morelle M.D.   On: 01/18/2015 16:57    Impression:  Plan:    LOS: 0 days   Clements Toro R  01/18/2015, 6:13 PM

## 2015-01-18 NOTE — Telephone Encounter (Signed)
In the ER now Will follow what happens tomorrow or Monday

## 2015-01-18 NOTE — ED Notes (Addendum)
Pt presents via [facility] for c/o increasing SOB, worse with exertion.  Also pt c/o abdominal swelling and cramping beginning yesterday.  Hx: CHF, COPD, diverticulitis.  Pt received 5 albuterol en route.  EKG unremarkable per EMS, Bp-170/90 P-105 R-24 O2-98% neb, pt a x 4, able to ambulate to stretcher on arrival.  Speaking in full,complete sentences.  Pt reports taking 4 baby ASA this AM.

## 2015-01-18 NOTE — Progress Notes (Signed)
CRITICAL VALUE ALERT  Critical value received:  Troponin 0.76  Date of notification:  01/18/15  Time of notification:  2045  Critical value read back: YES  Nurse who received alert:  Sherryl Manges, RN  MD notified (1st page):  Donnal Debar, NP  Time of first page:  2110  MD notified (2nd page):  Time of second page:  Responding MD:  Donnal Debar, NP  Time MD responded:  2115

## 2015-01-18 NOTE — Telephone Encounter (Signed)
PLEASE NOTE: All timestamps contained within this report are represented as [place] Standard Time. CONFIDENTIALTY NOTICE: This fax transmission is intended only for the addressee. It contains information that is legally privileged, confidential or otherwise protected from use or disclosure. If you are not the intended recipient, you are strictly prohibited from reviewing, disclosing, copying using or disseminating any of this information or taking any action in reliance on or regarding this information. If you have received this fax in error, please notify us immediately by telephone so that we can arrange for its return to [place]. Phone: [phone], Toll-Free: [phone], Fax: [phone] Page: 1 of 2 Call Id: [id number] Allen Patient Name: Haley Harris Gender: Female DOB: 05/02/1929 Age: 81 Y 86 M Return Phone Number: [phone] (Secondary) Address: City/State/Zip: [place] [address] Client [facility] - Client Client Site [place] - Day Physician [name] Contact Type Call Call Type Triage / Clinical [name] Name [facility] Relationship To Patient Daughter Appointment Disposition EMR Appointment Not Necessary Info pasted into Epic Yes Return Phone Number [phone] (Secondary) Chief Complaint ABDOMINAL PAIN - Severe and only in abdomen Initial Comment [name] says her mother has had severe ABD pain for the past 20 hours; including now PreDisposition Call Doctor Nurse Assessment Nurse: Verlin Fester, RN, Stanton Kidney Date/Time Eilene Ghazi Time): 01/18/2015 10:27:56 AM Confirm and document reason for call. If symptomatic, describe symptoms. ---[name] states patient is having severe abdominal pain in the upper and lower abdomen Has the patient traveled out of the country within the last 30 days? ---No Does the patient require triage?  ---Yes Related visit to physician within the last 2 weeks? ---No Does the PT have any chronic conditions? (i.e. diabetes, asthma, etc.) ---Yes List chronic conditions. ---"multiple health problems" Guidelines Guideline Title Affirmed Question Affirmed Notes Nurse Date/Time Eilene Ghazi Time) Abdominal Pain - Female [1] SEVERE pain (e.g., excruciating) AND [2] present > 1 hour Verlin Fester, RN, Stanton Kidney 01/18/2015 10:29:00 AM Disp. Time Eilene Ghazi Time) Disposition Final User 01/18/2015 10:24:22 AM Send to Urgent Micheal Likens 01/18/2015 10:30:29 AM Go to ED Now Yes Verlin Fester, RN, Nemiah Commander Understands: Yes Disagree/Comply: Comply PLEASE NOTE: All timestamps contained within this report are represented as [place] Standard Time. CONFIDENTIALTY NOTICE: This fax transmission is intended only for the addressee. It contains information that is legally privileged, confidential or otherwise protected from use or disclosure. If you are not the intended recipient, you are strictly prohibited from reviewing, disclosing, copying using or disseminating any of this information or taking any action in reliance on or regarding this information. If you have received this fax in error, please notify us immediately by telephone so that we can arrange for its return to [place]. Phone: [phone], Toll-Free: [phone], Fax: [phone] Page: 2 of 2 Call Id: [id number] Care Advice Given Per Guideline GO TO ED NOW: You need to be seen in the Emergency Department. Go to the ER at [address] [place] now. Drive carefully. DRIVING: Another adult should drive. BRING MEDICINES: * Please bring a list of your current medicines when you go to the Emergency Department (ER). * It is also a good idea to bring the pill bottles too. This will help the doctor to make certain you are taking the right medicines and the right dose. NOTHING BY MOUTH: Do not eat or drink anything for now. (Reason: condition may need surgery and general  anesthesia) CARE ADVICE given per  Abdominal Pain, Female (Adult) guideline. After Care Instructions Given Call Event Type User Date / Time Description Referrals [facility] - ED

## 2015-01-19 ENCOUNTER — Inpatient Hospital Stay (HOSPITAL_COMMUNITY): Payer: Medicare Other

## 2015-01-19 DIAGNOSIS — R600 Localized edema: Secondary | ICD-10-CM

## 2015-01-19 DIAGNOSIS — I739 Peripheral vascular disease, unspecified: Secondary | ICD-10-CM

## 2015-01-19 DIAGNOSIS — E785 Hyperlipidemia, unspecified: Secondary | ICD-10-CM

## 2015-01-19 DIAGNOSIS — I5042 Chronic combined systolic (congestive) and diastolic (congestive) heart failure: Secondary | ICD-10-CM

## 2015-01-19 DIAGNOSIS — D62 Acute posthemorrhagic anemia: Secondary | ICD-10-CM | POA: Diagnosis present

## 2015-01-19 DIAGNOSIS — N183 Chronic kidney disease, stage 3 unspecified: Secondary | ICD-10-CM | POA: Diagnosis present

## 2015-01-19 DIAGNOSIS — E871 Hypo-osmolality and hyponatremia: Secondary | ICD-10-CM

## 2015-01-19 DIAGNOSIS — R7989 Other specified abnormal findings of blood chemistry: Secondary | ICD-10-CM

## 2015-01-19 DIAGNOSIS — J438 Other emphysema: Secondary | ICD-10-CM | POA: Diagnosis present

## 2015-01-19 LAB — CBC
HEMATOCRIT: 30.5 % — AB (ref 36.0–46.0)
Hemoglobin: 10.2 g/dL — ABNORMAL LOW (ref 12.0–15.0)
MCH: 32.2 pg (ref 26.0–34.0)
MCHC: 33.4 g/dL (ref 30.0–36.0)
MCV: 96.2 fL (ref 78.0–100.0)
Platelets: 187 10*3/uL (ref 150–400)
RBC: 3.17 MIL/uL — AB (ref 3.87–5.11)
RDW: 12.9 % (ref 11.5–15.5)
WBC: 11.7 10*3/uL — AB (ref 4.0–10.5)

## 2015-01-19 LAB — COMPREHENSIVE METABOLIC PANEL
ALT: 11 U/L — AB (ref 14–54)
AST: 23 U/L (ref 15–41)
Albumin: 3.2 g/dL — ABNORMAL LOW (ref 3.5–5.0)
Alkaline Phosphatase: 38 U/L (ref 38–126)
Anion gap: 9 (ref 5–15)
BUN: 32 mg/dL — ABNORMAL HIGH (ref 6–20)
CALCIUM: 7.4 mg/dL — AB (ref 8.9–10.3)
CO2: 21 mmol/L — ABNORMAL LOW (ref 22–32)
CREATININE: 1.17 mg/dL — AB (ref 0.44–1.00)
Chloride: 97 mmol/L — ABNORMAL LOW (ref 101–111)
GFR calc Af Amer: 49 mL/min — ABNORMAL LOW (ref >60)
GFR calc non Af Amer: 42 mL/min — ABNORMAL LOW (ref >60)
GLUCOSE: 138 mg/dL — AB (ref 65–99)
Potassium: 4.4 mmol/L (ref 3.5–5.1)
SODIUM: 127 mmol/L — AB (ref 135–145)
TOTAL PROTEIN: 5.9 g/dL — AB (ref 6.5–8.1)
Total Bilirubin: 0.6 mg/dL (ref 0.3–1.2)

## 2015-01-19 LAB — TROPONIN I
Troponin I: 0.4 ng/mL — ABNORMAL HIGH (ref <0.031)
Troponin I: 0.6 ng/mL (ref <0.031)

## 2015-01-19 LAB — CEA: CEA: 2.9 ng/mL (ref 0.0–4.7)

## 2015-01-19 LAB — OSMOLALITY, URINE: Osmolality, Ur: 543 mOsm/kg (ref 390–1090)

## 2015-01-19 LAB — MAGNESIUM: Magnesium: 2.4 mg/dL (ref 1.7–2.4)

## 2015-01-19 LAB — HEPARIN LEVEL (UNFRACTIONATED): Heparin Unfractionated: 0.82 IU/mL — ABNORMAL HIGH (ref 0.30–0.70)

## 2015-01-19 LAB — PHOSPHORUS: Phosphorus: 2.7 mg/dL (ref 2.5–4.6)

## 2015-01-19 LAB — TYPE AND SCREEN
ABO/RH(D): O POS
ANTIBODY SCREEN: NEGATIVE

## 2015-01-19 LAB — OSMOLALITY: Osmolality: 285 mOsm/kg (ref 275–300)

## 2015-01-19 LAB — GLUCOSE, CAPILLARY: GLUCOSE-CAPILLARY: 131 mg/dL — AB (ref 65–99)

## 2015-01-19 MED ORDER — PANTOPRAZOLE SODIUM 40 MG IV SOLR: 40.0000 mg | Freq: Two times a day (BID) | INTRAVENOUS | Status: DC

## 2015-01-19 MED ORDER — SODIUM CHLORIDE 0.9 % IV SOLN: Freq: Once | INTRAVENOUS | Status: DC

## 2015-01-19 MED ORDER — GADOBENATE DIMEGLUMINE 529 MG/ML IV SOLN
10.0000 mL | Freq: Once | INTRAVENOUS | Status: AC | PRN
  Administered 2015-01-19: 8 mL via INTRAVENOUS

## 2015-01-19 MED ORDER — SODIUM CHLORIDE 0.9 % IJ SOLN
10.0000 mL | INTRAMUSCULAR | Status: DC | PRN
  Administered 2015-01-21 – 2015-01-24 (×4): 10 mL
  Administered 2015-01-26 – 2015-01-27 (×3): 20 mL
  Filled 2015-01-19 (×7): qty 40

## 2015-01-19 MED ORDER — PANTOPRAZOLE SODIUM 40 MG IV SOLR
8.0000 mg/h | INTRAVENOUS | Status: AC
  Administered 2015-01-19 – 2015-01-22 (×7): 8 mg/h via INTRAVENOUS
  Filled 2015-01-19 (×11): qty 80

## 2015-01-19 MED ORDER — DEXTROSE-NACL 5-0.9 % IV SOLN
INTRAVENOUS | Status: DC
  Administered 2015-01-19 – 2015-01-21 (×3): via INTRAVENOUS

## 2015-01-19 MED ORDER — PANTOPRAZOLE SODIUM 40 MG IV SOLR
40.0000 mg | Freq: Two times a day (BID) | INTRAVENOUS | Status: DC
  Administered 2015-01-19: 40 mg via INTRAVENOUS

## 2015-01-19 MED ORDER — SODIUM CHLORIDE 0.9 % IJ SOLN
10.0000 mL | Freq: Two times a day (BID) | INTRAMUSCULAR | Status: DC
Start: 2015-01-19 — End: 2015-01-21
  Administered 2015-01-19 – 2015-01-20 (×3): 10 mL

## 2015-01-19 NOTE — Progress Notes (Signed)
Peripherally Inserted Central Catheter/Midline Placement  The IV Nurse has discussed with the patient and/or persons authorized to consent for the patient, the purpose of this procedure and the potential benefits and risks involved with this procedure.  The benefits include less needle sticks, lab draws from the catheter and patient may be discharged home with the catheter.  Risks include, but not limited to, infection, bleeding, blood clot (thrombus formation), and puncture of an artery; nerve damage and irregular heat beat.  Alternatives to this procedure were also discussed.  PICC/Midline Placement Documentation  PICC / Midline Double Lumen 18/40/37 PICC Right Basilic 43 cm 0 cm (Active)  Indication for Insertion or Continuance of Line Poor Vasculature-patient has had multiple peripheral attempts or PIVs lasting less than 24 hours 01/19/2015  4:29 PM  Exposed Catheter (cm) 0 cm 01/19/2015  4:29 PM  Site Assessment Clean;Dry;Intact 01/19/2015  4:29 PM  Lumen #1 Status Flushed;Saline locked;Blood return noted 01/19/2015  4:29 PM  Lumen #2 Status Flushed;Saline locked;Blood return noted 01/19/2015  4:29 PM  Dressing Type Transparent 01/19/2015  4:29 PM  Dressing Change Due 01/26/15 01/19/2015  4:29 PM       Gordan Payment 01/19/2015, 4:31 PM

## 2015-01-19 NOTE — Progress Notes (Signed)
Subjective:  Doing well denies any chest pain or shortness of breath states abdominal pain is better. Cardiac enzymes are trending down. 2-D echo still pending  Objective:  Vital Signs in the last 24 hours: Temp:  [97.8 F (36.6 C)-99 F (37.2 C)] 97.8 F (36.6 C) (07/09 0839) Pulse Rate:  [75-105] 75 (07/09 0451) Resp:  [11-19] 17 (07/09 0839) BP: (94-154)/(50-99) 110/51 mmHg (07/09 0839) SpO2:  [91 %-100 %] 100 % (07/09 0451) Weight:  [80.786 kg (178 lb 1.6 oz)-81.149 kg (178 lb 14.4 oz)] 81.149 kg (178 lb 14.4 oz) (07/09 0500)  Intake/Output from previous day: 07/08 0701 - 07/09 0700 In: 780.2 [I.V.:730.2; IV Piggyback:50] Out: 300 [Urine:300] Intake/Output from this shift: Total I/O In: 3 [I.V.:3] Out: 300 [Urine:300]  Physical Exam: Neck: no adenopathy, no carotid bruit, no JVD and supple, symmetrical, trachea midline Lungs: Decreased breath sound at bases Heart: regular rate and rhythm, S1, S2 normal and Soft systolic murmur noted Abdomen: Soft distended bowel sounds present mild generalized tenderness no guarding Extremities: extremities normal, atraumatic, no cyanosis or edema  Lab Results:  Recent Labs  01/18/15 1228 01/18/15 1243 01/19/15 0434  WBC 8.7  --  11.7*  HGB 12.1 13.6 10.2*  PLT 238  --  187    Recent Labs  01/18/15 1243 01/19/15 0434  NA 128* 127*  K 3.9 4.4  CL 97* 97*  CO2  --  21*  GLUCOSE 151* 138*  BUN 34* 32*  CREATININE 1.10* 1.17*    Recent Labs  01/18/15 2333 01/19/15 0434  TROPONINI 0.60* 0.40*   Hepatic Function Panel  Recent Labs  01/18/15 1228 01/19/15 0434  PROT 7.1 5.9*  ALBUMIN 4.0 3.2*  AST 28 23  ALT 14 11*  ALKPHOS 52 38  BILITOT 1.3* 0.6  BILIDIR 0.2  --   IBILI 1.1*  --    No results for input(s): CHOL in the last 72 hours. No results for input(s): PROTIME in the last 72 hours.  Imaging: Imaging results have been reviewed and Dg Chest 2 View  01/18/2015   CLINICAL DATA:  Increasing shortness of  breath with exertion  EXAM: CHEST  2 VIEW  COMPARISON:  March 03, 2014  FINDINGS: There is mild scarring in the left base region. There is no edema or consolidation. Heart is upper normal in size with pulmonary vascularity within normal limits. There is atherosclerotic change in the aorta. No adenopathy.  There are wedge compression fractures in the mid and lower thoracic spine with increase in kyphosis. There is evidence of old fractures of each proximal humeral metaphysis, stable. There is calcification in the left carotid artery.  IMPRESSION: Scarring left base. No edema or consolidation. No change in cardiac silhouette. Old fractures as noted. Increase in thoracic kyphosis. Calcification left carotid artery.   Electronically Signed   By: Lowella Grip III M.D.   On: 01/18/2015 14:01   Ct Abdomen Pelvis W Contrast  01/18/2015   CLINICAL DATA:  Lower abdominal pain, swelling, and cramping since yesterday.  EXAM: CT ABDOMEN AND PELVIS WITH CONTRAST  TECHNIQUE: Multidetector CT imaging of the abdomen and pelvis was performed using the standard protocol following bolus administration of intravenous contrast.  CONTRAST:  30mL OMNIPAQUE IOHEXOL 300 MG/ML  SOLN  COMPARISON:  None.  FINDINGS: Lower Chest: Cardiomegaly and mild dependent bibasilar atelectasis noted.  Hepatobiliary: Probable tiny sub-cm hepatic cysts noted, but no definite liver masses are identified. Gallbladder is unremarkable.  Pancreas: No mass, inflammatory changes, or other  significant abnormality identified.  Spleen:  Within normal limits in size and appearance.  Adrenals: 3.1 cm right adrenal mass is seen which has nonspecific features and enhancement characteristics.  Kidneys/Urinary Tract:  No evidence of masses or hydronephrosis.  Stomach/Bowel/Peritoneum: Small hiatal hernia noted. There is diffuse colonic dilatation. Abrupt transition with shoulder ring is seen in the mid sigmoid colon where there is evidence of annular colonic wall  thickening measuring approximately 3.9 cm in length. This is highly suspicious for obstructing colon carcinoma. Sigmoid diverticulosis is demonstrated, however there is no evidence of diverticulitis. No evidence of small bowel obstruction.  Vascular/Lymphatic: No pathologically enlarged lymph nodes identified. 8 mm left common iliac lymph node noted on image 49/ series 201, which is not considered pathologically enlarged. No abdominal aortic aneurysm or other significant retroperitoneal abnormality demonstrated.  Reproductive: Prior hysterectomy noted. Adnexal regions are unremarkable in appearance. Tiny amount of free fluid seen in the dependent pelvis.  Other:  None.  Musculoskeletal: No suspicious bone lesions identified. Lower lumbar spine fusion hardware and old T12 vertebral body compression fracture noted.  IMPRESSION: Distal colonic obstruction in the mid sigmoid colon, with appearance highly suspicious for obstructing colon adenocarcinoma. Sigmoidoscopy or colonoscopy recommended for further evaluation.  Sigmoid diverticulosis. No radiographic evidence of diverticulitis.  No definite evidence of metastatic disease within the abdomen or pelvis.  3.1 cm indeterminate right adrenal mass and probable tiny hepatic cysts. Abdomen MRI without and with contrast recommended for further evaluation.   Electronically Signed   By: Earle Gell M.D.   On: 01/18/2015 15:15   Dg Abd Portable 1v  01/18/2015   CLINICAL DATA:  Nausea and abdominal pain since yesterday.  EXAM: PORTABLE ABDOMEN - 1 VIEW  COMPARISON:  CT abdomen and pelvis from the same day.  FINDINGS: NG tube is in place. Moderate distention of the colon per cysts, similar to the topogram of the CT scan. There is no definite free air. Contrast can be seen within the distal small bowel. Surgical changes are noted in the lumbar spine. Atherosclerotic changes are present in the aorta.  IMPRESSION: Similar appearance of distal colonic obstruction.  NG tube in  place.   Electronically Signed   By: San Morelle M.D.   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 Ischemic cardiomyopathy. Hypertension. Hypercholesterolemia. COPD History of cerebrovascular disease. History of CVA of breast. Spinal stenosis. Vitamin D deficiency. Osteoporosis. History of compression fracture of thoracic spine with kyphosis Left leg swelling, rule out DVT. Plan Continue present management Check 2-D echo Schedule for colonoscopy on Monday Okay to DC heparin from cardiac point of view if no evidence of DVT Will discuss regarding further cardiac workup depending on colonoscopy results. Hold off Plavix for now in anticipation of abdominal surgery.  LOS: 1 day    Charolette Forward 01/19/2015, 10:36 AM

## 2015-01-19 NOTE — Progress Notes (Signed)
ANTICOAGULATION CONSULT NOTE - Follow Up Consult  Pharmacy Consult for Heparin  Indication: chest pain/ACS, possible LE DVT  Allergies  Allergen Reactions  . Amoxicillin-Pot Clavulanate Anaphylaxis  . Cephalexin Other (See Comments)    Reaction unknown  . Clarithromycin Other (See Comments)    Reaction unknown  . Lidocaine   . Nifedipine Other (See Comments)    Reaction unknown  . Nsaids Other (See Comments)    Heart issue  . Olmesartan Medoxomil     REACTION: cough;  But tolerating Losartan 02/2014  . Penicillins   . Tramadol Hcl Other (See Comments)    Reaction unknown    Patient Measurements: Height: 5\' 6"  (167.6 cm) Weight: 178 lb 1.6 oz (80.786 kg) IBW/kg (Calculated) : 59.3  Vital Signs: Temp: 97.9 F (36.6 C) (07/08 2353) Temp Source: Oral (07/08 2353) BP: 104/58 mmHg (07/08 2353) Pulse Rate: 83 (07/08 2353)  Labs:  Recent Labs  01/18/15 1228 01/18/15 1243 01/18/15 1926 01/18/15 2333 01/19/15 0100  HGB 12.1 13.6  --   --   --   HCT 35.3* 40.0  --   --   --   PLT 238  --   --   --   --   LABPROT 14.2  --   --   --   --   INR 1.08  --   --   --   --   HEPARINUNFRC  --   --   --   --  0.82*  CREATININE  --  1.10*  --   --   --   TROPONINI  --   --  0.76* 0.60*  --     Estimated Creatinine Clearance: 41.5 mL/min (by C-G formula based on Cr of 1.1). Assessment: Supra-therapeutic heparin level, no issues per RN.   Goal of Therapy:  Heparin level 0.3-0.7 units/ml Monitor platelets by anticoagulation protocol: Yes   Plan:  -Decrease heparin to 1100 units/hr -1000 HL -Daily CBC/HL -Monitor for bleeding  Narda Bonds 01/19/2015,1:43 AM

## 2015-01-19 NOTE — Progress Notes (Addendum)
ANTICOAGULATION CONSULT NOTE - Follow Up Consult  Pharmacy Consult for Heparin  Indication: chest pain/ACS, possible LE DVT Labs:  Recent Labs  01/18/15 1228 01/18/15 1243 01/18/15 1926 01/18/15 2333 01/19/15 0100 01/19/15 0434  HGB 12.1 13.6  --   --   --  10.2*  HCT 35.3* 40.0  --   --   --  30.5*  PLT 238  --   --   --   --  187  LABPROT 14.2  --   --   --   --   --   INR 1.08  --   --   --   --   --   HEPARINUNFRC  --   --   --   --  0.82*  --   CREATININE  --  1.10*  --   --   --  1.17*  TROPONINI  --   --  0.76* 0.60*  --  0.40*    Estimated Creatinine Clearance: 39.1 mL/min (by C-G formula based on Cr of 1.17).  Assessment: 81yo female admitted 01/18/2015  with abdominal pain and SOB & 1 month of LLE swelling & slightly elevated troponin. Pharmacy is consulted to dose heparin for ACS/chest pain and a presumed LLE DVT.  PMH: Breast CA, CAD, Carotid artery stenosis, COPD, Depression, GERD,  HF (combined diastoic & systolic EF 14-10%) HTN, HLD,   AC/Heme: LLE DVT, r/o MI CBC low, no bleeding noted  ON: heparin @ 3013/HY, thiamine, folic acid  Goal of Therapy:  Heparin level 0.3-0.7 units/ml Monitor platelets by anticoagulation protocol: Yes   Plan:  Continue heparin to 1100 units/hr -1000 HL -Daily CBC/HL -Monitor for bleeding  Thank you for allowing pharmacy to be a part of this patients care team.  Rowe Robert Pharm.D., BCPS, AQ-Cardiology Clinical Pharmacist 01/19/2015 10:06 AM Pager: [phone] Phone: [phone]   1:11 PM Patient now bleeding via eg tube.  Doppler negative for DVT.  Per Dr. Sherral Hammers DC heparin.  Morris Markham C

## 2015-01-19 NOTE — Progress Notes (Signed)
VASCULAR LAB PRELIMINARY  PRELIMINARY  PRELIMINARY  PRELIMINARY  Left lower extremity venous duplex completed.    Preliminary report:  Left:  No evidence of DVT, superficial thrombosis, or Baker's cyst.  Herchel Hopkin, RVT 01/19/2015, 10:45 AM

## 2015-01-19 NOTE — Consult Note (Signed)
Reason for Consult: sigmoid lesions Referring Physician: Dr. Philomena Doheny is an 81 y.o. female.  HPI: The patient is an 81 year old female who has a past medical history significant for COPD, CAD-stent placements-on Plavix and aspirin, previous history of breast cancer, congestive heart failure. She was admitted secondary to large bowel obstruction. Patient states that approximate 48 hours ago she began having generalized abdominal distention and pain. She states that she had some obstipation at this time. She states her last bowel movement was yesterday a.m. Secondary to continued nausea vomiting, and distention patient presented to ER for further evaluation.  Patient was worked up with a CT scan which revealed a sigmoid Careers adviser. GI was consult. Patient had previously had colonoscopies as per Dr. Earlean Shawl with the last being 2008. Patient states that since admission she has had some flatus however has not had a bowel movement. She does state that her abdomen is slightly improved as far as its distention.  Dr. Earlean Shawl has scheduled a sigmoidoscopy for Monday. Patient has had CEA of 2.9. CT of her abdomen and chest reveals no hepatic lesions, there is a 3.1 cm right adrenal mass.   Of note patient was also seen have elevated troponins. Cardiology was involved and started on a heparin drip and underwent workup with echocardiogram.. Secondary to bloody NG tube drainage the heparin drip was DC'd. There is also concern for possible DVT. Ultrasound was ordered which was negative.  Patient's Plavix has been on hold.  Past Medical History  Diagnosis Date  . Allergic rhinitis, cause unspecified   . Unspecified cardiovascular disease   . Personal history of malignant neoplasm of breast   . Occlusion and stenosis of carotid artery without mention of cerebral infarction   . Chronic airway obstruction, not elsewhere classified   . Depressive disorder, not elsewhere classified   . Other dyspnea and  respiratory abnormality   . Esophageal reflux   . Unspecified hearing loss   . Other and unspecified hyperlipidemia   . Unspecified essential hypertension   . Osteoporosis, unspecified   . Peripheral vascular disease, unspecified   . Unspecified urinary incontinence   . Spinal stenosis   . ACE-inhibitor cough   . Angina   . Heart murmur     aS CHILD  . Shortness of breath   . Recurrent upper respiratory infection (URI)   . Anxiety   . Pneumonia   . 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 Myocardial infarct   . Osteoarthrosis, unspecified whether generalized or localized, unspecified site   . Malignant neoplasm of breast (female), unspecified site   . Compression fracture of T12 vertebra 10/28/2011    Past Surgical History  Procedure Laterality Date  . Mastectomy  1987    Right  . Breast reconstruction  1998    Reconstruction   . Reduction mammaplasty  1998    Left  . Mastoidectomy  childhood  . Appendectomy  1948  . Lumbar laminectomy  2003  . Shoulder surgery  02/2005    Bilateral fractures with multiple surgeries  . Breast implant removal  06/12/09    right  . Abdominal hysterectomy    . Fracture surgery      fracture right elbow  . Coronary angioplasty  11/12    distal RCA  . Mastectomy    . Left heart catheterization with coronary angiogram N/A 06/05/2011    Procedure: LEFT HEART CATHETERIZATION WITH CORONARY ANGIOGRAM;  Surgeon: Clent Demark,  MD;  Location: [facility] CATH LAB;  Service: Cardiovascular;  Laterality: N/A;  . Left heart catheterization with coronary angiogram N/A 03/05/2014    Procedure: LEFT HEART CATHETERIZATION WITH CORONARY ANGIOGRAM;  Surgeon: Clent Demark, MD;  Location: [facility] CATH LAB;  Service: Cardiovascular;  Laterality: N/A;    Family History  Problem Relation Age of Onset  . Heart attack Father 60  . Cancer Mother     ovarian  . Cancer Brother     lung  . Arthritis      family    Social History:  reports that she quit  smoking about 40 years ago. She has never used smokeless tobacco. She reports that she drinks alcohol. She reports that she does not use illicit drugs.  Allergies:  Allergies  Allergen Reactions  . Amoxicillin-Pot Clavulanate Anaphylaxis  . Cephalexin Other (See Comments)    Reaction unknown  . Clarithromycin Other (See Comments)    Reaction unknown  . Lidocaine   . Nifedipine Other (See Comments)    Reaction unknown  . Nsaids Other (See Comments)    Heart issue  . Olmesartan Medoxomil     REACTION: cough;  But tolerating Losartan 02/2014  . Penicillins   . Tramadol Hcl Other (See Comments)    Reaction unknown    Medications: I have reviewed the patient's current medications.  Results for orders placed or performed during the hospital encounter of 01/18/15 (from the past 48 hour(s))  BNP (order ONLY if patient complains of dyspnea/SOB AND you have documented it for THIS visit)     Status: Abnormal   Collection Time: 01/18/15 12:28 PM  Result Value Ref Range   B Natriuretic Peptide 1610.0 (H) 0.0 - 100.0 pg/mL  Protime-INR (if pt is taking Coumadin)     Status: None   Collection Time: 01/18/15 12:28 PM  Result Value Ref Range   Prothrombin Time 14.2 11.6 - 15.2 seconds   INR 1.08 0.00 - 1.49  Hepatic function panel     Status: Abnormal   Collection Time: 01/18/15 12:28 PM  Result Value Ref Range   Total Protein 7.1 6.5 - 8.1 g/dL   Albumin 4.0 3.5 - 5.0 g/dL   AST 28 15 - 41 U/L   ALT 14 14 - 54 U/L   Alkaline Phosphatase 52 38 - 126 U/L   Total Bilirubin 1.3 (H) 0.3 - 1.2 mg/dL   Bilirubin, Direct 0.2 0.1 - 0.5 mg/dL   Indirect Bilirubin 1.1 (H) 0.3 - 0.9 mg/dL  Lipase, blood     Status: Abnormal   Collection Time: 01/18/15 12:28 PM  Result Value Ref Range   Lipase 20 (L) 22 - 51 U/L  CBC with Differential/Platelet     Status: Abnormal   Collection Time: 01/18/15 12:28 PM  Result Value Ref Range   WBC 8.7 4.0 - 10.5 K/uL   RBC 3.73 (L) 3.87 - 5.11 MIL/uL    Hemoglobin 12.1 12.0 - 15.0 g/dL   HCT 35.3 (L) 36.0 - 46.0 %   MCV 94.6 78.0 - 100.0 fL   MCH 32.4 26.0 - 34.0 pg   MCHC 34.3 30.0 - 36.0 g/dL   RDW 12.4 11.5 - 15.5 %   Platelets 238 150 - 400 K/uL   Neutrophils Relative % 90 (H) 43 - 77 %   Neutro Abs 7.8 (H) 1.7 - 7.7 K/uL   Lymphocytes Relative 5 (L) 12 - 46 %   Lymphs Abs 0.5 (L) 0.7 - 4.0 K/uL  Monocytes Relative 5 3 - 12 %   Monocytes Absolute 0.4 0.1 - 1.0 K/uL   Eosinophils Relative 0 0 - 5 %   Eosinophils Absolute 0.0 0.0 - 0.7 K/uL   Basophils Relative 0 0 - 1 %   Basophils Absolute 0.0 0.0 - 0.1 K/uL  I-stat troponin, ED  (not at [facility])     Status: Abnormal   Collection Time: 01/18/15 12:41 PM  Result Value Ref Range   Troponin i, poc 0.61 (HH) 0.00 - 0.08 ng/mL   Comment NOTIFIED PHYSICIAN    Comment 3            Comment: Due to the release kinetics of cTnI, a negative result within the first hours of the onset of symptoms does not rule out myocardial infarction with certainty. If myocardial infarction is still suspected, repeat the test at appropriate intervals.   I-stat chem 8, ed     Status: Abnormal   Collection Time: 01/18/15 12:43 PM  Result Value Ref Range   Sodium 128 (L) 135 - 145 mmol/L   Potassium 3.9 3.5 - 5.1 mmol/L   Chloride 97 (L) 101 - 111 mmol/L   BUN 34 (H) 6 - 20 mg/dL   Creatinine, Ser 1.10 (H) 0.44 - 1.00 mg/dL   Glucose, Bld 151 (H) 65 - 99 mg/dL   Calcium, Ion 1.06 (L) 1.13 - 1.30 mmol/L   TCO2 16 0 - 100 mmol/L   Hemoglobin 13.6 12.0 - 15.0 g/dL   HCT 40.0 36.0 - 46.0 %  Urinalysis, Routine w reflex microscopic (not at [facility])     Status: Abnormal   Collection Time: 01/18/15  1:12 PM  Result Value Ref Range   Color, Urine AMBER (A) YELLOW    Comment: BIOCHEMICALS MAY BE AFFECTED BY COLOR   APPearance CLEAR CLEAR   Specific Gravity, Urine 1.018 1.005 - 1.030   pH 5.0 5.0 - 8.0   Glucose, UA NEGATIVE NEGATIVE mg/dL   Hgb urine dipstick NEGATIVE NEGATIVE   Bilirubin Urine  MODERATE (A) NEGATIVE   Ketones, ur 40 (A) NEGATIVE mg/dL   Protein, ur NEGATIVE NEGATIVE mg/dL   Urobilinogen, UA 1.0 0.0 - 1.0 mg/dL   Nitrite NEGATIVE NEGATIVE   Leukocytes, UA NEGATIVE NEGATIVE    Comment: MICROSCOPIC NOT DONE ON URINES WITH NEGATIVE PROTEIN, BLOOD, LEUKOCYTES, NITRITE, OR GLUCOSE <1000 mg/dL.  CEA     Status: None   Collection Time: 01/18/15  4:43 PM  Result Value Ref Range   CEA 2.9 0.0 - 4.7 ng/mL    Comment: (NOTE)       Roche ECLIA methodology       Nonsmokers  <3.9                                     Smokers     <5.6 Performed At: [facility] [place], [place] [id number] Lindon Romp MD [id number]   D-dimer, quantitative (not at [facility])     Status: Abnormal   Collection Time: 01/18/15  4:43 PM  Result Value Ref Range   D-Dimer, Quant 1.93 (H) 0.00 - 0.48 ug/mL-FEU    Comment:        AT THE INHOUSE ESTABLISHED CUTOFF VALUE OF 0.48 ug/mL FEU, THIS ASSAY HAS BEEN DOCUMENTED IN THE LITERATURE TO HAVE A SENSITIVITY AND NEGATIVE PREDICTIVE VALUE OF AT LEAST 98 TO 99%.  THE TEST RESULT SHOULD  BE CORRELATED WITH AN ASSESSMENT OF THE CLINICAL PROBABILITY OF DVT / VTE.   MRSA PCR Screening     Status: None   Collection Time: 01/18/15  6:58 PM  Result Value Ref Range   MRSA by PCR NEGATIVE NEGATIVE    Comment:        The GeneXpert MRSA Assay (FDA approved for NASAL specimens only), is one component of a comprehensive MRSA colonization surveillance program. It is not intended to diagnose MRSA infection nor to guide or monitor treatment for MRSA infections.   Troponin I (q 6hr x 3)     Status: Abnormal   Collection Time: 01/18/15  7:26 PM  Result Value Ref Range   Troponin I 0.76 (HH) <0.031 ng/mL    Comment:        POSSIBLE MYOCARDIAL ISCHEMIA. SERIAL TESTING RECOMMENDED. REPEATED TO VERIFY CRITICAL RESULT CALLED TO, READ BACK BY AND VERIFIED WITH: Lowella Bandy 2032 01/18/15 D BRADLEY   Osmolality     Status: None    Collection Time: 01/18/15  8:17 PM  Result Value Ref Range   Osmolality 285 275 - 300 mOsm/kg    Comment: Performed at [facility]  Glucose, capillary     Status: Abnormal   Collection Time: 01/18/15  8:33 PM  Result Value Ref Range   Glucose-Capillary 127 (H) 65 - 99 mg/dL  Sodium, urine, random     Status: None   Collection Time: 01/18/15 10:18 PM  Result Value Ref Range   Sodium, Ur <10 mmol/L  Creatinine, urine, random     Status: None   Collection Time: 01/18/15 10:18 PM  Result Value Ref Range   Creatinine, Urine 88.91 mg/dL  Osmolality, urine     Status: None   Collection Time: 01/18/15 10:18 PM  Result Value Ref Range   Osmolality, Ur 543 390 - 1090 mOsm/kg    Comment: Performed at [facility]  Troponin I (q 6hr x 3)     Status: Abnormal   Collection Time: 01/18/15 11:33 PM  Result Value Ref Range   Troponin I 0.60 (HH) <0.031 ng/mL    Comment:        POSSIBLE MYOCARDIAL ISCHEMIA. SERIAL TESTING RECOMMENDED. REPEATED TO VERIFY CRITICAL VALUE NOTED.  VALUE IS CONSISTENT WITH PREVIOUSLY REPORTED AND CALLED VALUE.   Magnesium     Status: None   Collection Time: 01/18/15 11:33 PM  Result Value Ref Range   Magnesium 2.4 1.7 - 2.4 mg/dL  Phosphorus     Status: None   Collection Time: 01/18/15 11:33 PM  Result Value Ref Range   Phosphorus 2.7 2.5 - 4.6 mg/dL  Glucose, capillary     Status: Abnormal   Collection Time: 01/18/15 11:52 PM  Result Value Ref Range   Glucose-Capillary 126 (H) 65 - 99 mg/dL  Heparin level (unfractionated)     Status: Abnormal   Collection Time: 01/19/15  1:00 AM  Result Value Ref Range   Heparin Unfractionated 0.82 (H) 0.30 - 0.70 IU/mL    Comment:        IF HEPARIN RESULTS ARE BELOW EXPECTED VALUES, AND PATIENT DOSAGE HAS BEEN CONFIRMED, SUGGEST FOLLOW UP TESTING OF ANTITHROMBIN III LEVELS.   CBC     Status: Abnormal   Collection Time: 01/19/15  4:34 AM  Result Value Ref Range   WBC 11.7 (H) 4.0 - 10.5 K/uL   RBC  3.17 (L) 3.87 - 5.11 MIL/uL   Hemoglobin 10.2 (L) 12.0 - 15.0 g/dL  Comment: DELTA CHECK NOTED REPEATED TO VERIFY    HCT 30.5 (L) 36.0 - 46.0 %   MCV 96.2 78.0 - 100.0 fL   MCH 32.2 26.0 - 34.0 pg   MCHC 33.4 30.0 - 36.0 g/dL   RDW 12.9 11.5 - 15.5 %   Platelets 187 150 - 400 K/uL  Troponin I (q 6hr x 3)     Status: Abnormal   Collection Time: 01/19/15  4:34 AM  Result Value Ref Range   Troponin I 0.40 (H) <0.031 ng/mL    Comment:        PERSISTENTLY INCREASED TROPONIN VALUES IN THE RANGE OF 0.04-0.49 ng/mL CAN BE SEEN IN:       -UNSTABLE ANGINA       -CONGESTIVE HEART FAILURE       -MYOCARDITIS       -CHEST TRAUMA       -ARRYHTHMIAS       -LATE PRESENTING MYOCARDIAL INFARCTION       -COPD   CLINICAL FOLLOW-UP RECOMMENDED.   Comprehensive metabolic panel     Status: Abnormal   Collection Time: 01/19/15  4:34 AM  Result Value Ref Range   Sodium 127 (L) 135 - 145 mmol/L   Potassium 4.4 3.5 - 5.1 mmol/L   Chloride 97 (L) 101 - 111 mmol/L   CO2 21 (L) 22 - 32 mmol/L   Glucose, Bld 138 (H) 65 - 99 mg/dL   BUN 32 (H) 6 - 20 mg/dL   Creatinine, Ser 1.17 (H) 0.44 - 1.00 mg/dL   Calcium 7.4 (L) 8.9 - 10.3 mg/dL   Total Protein 5.9 (L) 6.5 - 8.1 g/dL   Albumin 3.2 (L) 3.5 - 5.0 g/dL   AST 23 15 - 41 U/L   ALT 11 (L) 14 - 54 U/L   Alkaline Phosphatase 38 38 - 126 U/L   Total Bilirubin 0.6 0.3 - 1.2 mg/dL   GFR calc non Af Amer 42 (L) >60 mL/min   GFR calc Af Amer 49 (L) >60 mL/min    Comment: (NOTE) The eGFR has been calculated using the CKD EPI equation. This calculation has not been validated in all clinical situations. eGFR's persistently <60 mL/min signify possible Chronic Kidney Disease.    Anion gap 9 5 - 15  Glucose, capillary     Status: Abnormal   Collection Time: 01/19/15  4:50 AM  Result Value Ref Range   Glucose-Capillary 131 (H) 65 - 99 mg/dL  Type and screen     Status: None   Collection Time: 01/19/15  1:19 PM  Result Value Ref Range   ABO/RH(D) O  POS    Antibody Screen NEG    Sample Expiration 01/22/2015     Dg Chest 2 View  01/18/2015   CLINICAL DATA:  Increasing shortness of breath with exertion  EXAM: CHEST  2 VIEW  COMPARISON:  March 03, 2014  FINDINGS: There is mild scarring in the left base region. There is no edema or consolidation. Heart is upper normal in size with pulmonary vascularity within normal limits. There is atherosclerotic change in the aorta. No adenopathy.  There are wedge compression fractures in the mid and lower thoracic spine with increase in kyphosis. There is evidence of old fractures of each proximal humeral metaphysis, stable. There is calcification in the left carotid artery.  IMPRESSION: Scarring left base. No edema or consolidation. No change in cardiac silhouette. Old fractures as noted. Increase in thoracic kyphosis. Calcification left carotid artery.  Electronically Signed   By: Lowella Grip III M.D.   On: 01/18/2015 14:01   Ct Chest Wo Contrast  01/19/2015   CLINICAL DATA:  Sigmoid colon mass, possible adenocarcinoma, evaluate for metastasis  EXAM: CT CHEST WITHOUT CONTRAST  TECHNIQUE: Multidetector CT imaging of the chest was performed following the standard protocol without IV contrast.  COMPARISON:  Chest radiographs dated 01/18/2015  FINDINGS: Mediastinum/Nodes: The heart is top-normal in size. Trace pleural fluid.  Coronary atherosclerosis.  Atherosclerotic calcifications of the aortic arch.  No suspicious mediastinal or axillary lymphadenopathy.  Visualized thyroid is unremarkable.  Lungs/Pleura: Negative mild patchy bibasilar opacities, likely atelectasis. No focal consolidation.  Trace bilateral pleural effusions.  No suspicious pulmonary nodules.  No pneumothorax.  Upper abdomen: Enteric tube coursing into the proximal stomach. Visualized upper abdomen is otherwise unchanged from recent CT, noting a 3.0 cm right adrenal nodule/mass.  Musculoskeletal: Severe compression fracture deformities at T6 and  T12. Moderate compression fracture deformity at T8.  IMPRESSION: No evidence of metastatic disease in the chest.  Trace bilateral pleural effusions.  Severe compression fracture deformities at T6 and T12. Moderate compression fracture deformity at T8.   Electronically Signed   By: Julian Hy M.D.   On: 01/19/2015 15:19   Ct Abdomen Pelvis W Contrast  01/18/2015   CLINICAL DATA:  Lower abdominal pain, swelling, and cramping since yesterday.  EXAM: CT ABDOMEN AND PELVIS WITH CONTRAST  TECHNIQUE: Multidetector CT imaging of the abdomen and pelvis was performed using the standard protocol following bolus administration of intravenous contrast.  CONTRAST:  32m OMNIPAQUE IOHEXOL 300 MG/ML  SOLN  COMPARISON:  None.  FINDINGS: Lower Chest: Cardiomegaly and mild dependent bibasilar atelectasis noted.  Hepatobiliary: Probable tiny sub-cm hepatic cysts noted, but no definite liver masses are identified. Gallbladder is unremarkable.  Pancreas: No mass, inflammatory changes, or other significant abnormality identified.  Spleen:  Within normal limits in size and appearance.  Adrenals: 3.1 cm right adrenal mass is seen which has nonspecific features and enhancement characteristics.  Kidneys/Urinary Tract:  No evidence of masses or hydronephrosis.  Stomach/Bowel/Peritoneum: Small hiatal hernia noted. There is diffuse colonic dilatation. Abrupt transition with shoulder ring is seen in the mid sigmoid colon where there is evidence of annular colonic wall thickening measuring approximately 3.9 cm in length. This is highly suspicious for obstructing colon carcinoma. Sigmoid diverticulosis is demonstrated, however there is no evidence of diverticulitis. No evidence of small bowel obstruction.  Vascular/Lymphatic: No pathologically enlarged lymph nodes identified. 8 mm left common iliac lymph node noted on image 49/ series 201, which is not considered pathologically enlarged. No abdominal aortic aneurysm or other significant  retroperitoneal abnormality demonstrated.  Reproductive: Prior hysterectomy noted. Adnexal regions are unremarkable in appearance. Tiny amount of free fluid seen in the dependent pelvis.  Other:  None.  Musculoskeletal: No suspicious bone lesions identified. Lower lumbar spine fusion hardware and old T12 vertebral body compression fracture noted.  IMPRESSION: Distal colonic obstruction in the mid sigmoid colon, with appearance highly suspicious for obstructing colon adenocarcinoma. Sigmoidoscopy or colonoscopy recommended for further evaluation.  Sigmoid diverticulosis. No radiographic evidence of diverticulitis.  No definite evidence of metastatic disease within the abdomen or pelvis.  3.1 cm indeterminate right adrenal mass and probable tiny hepatic cysts. Abdomen MRI without and with contrast recommended for further evaluation.   Electronically Signed   By: JEarle GellM.D.   On: 01/18/2015 15:15   Dg Abd Portable 1v  01/18/2015   CLINICAL DATA:  Nausea and  abdominal pain since yesterday.  EXAM: PORTABLE ABDOMEN - 1 VIEW  COMPARISON:  CT abdomen and pelvis from the same day.  FINDINGS: NG tube is in place. Moderate distention of the colon per cysts, similar to the topogram of the CT scan. There is no definite free air. Contrast can be seen within the distal small bowel. Surgical changes are noted in the lumbar spine. Atherosclerotic changes are present in the aorta.  IMPRESSION: Similar appearance of distal colonic obstruction.  NG tube in place.   Electronically Signed   By: San Morelle M.D.   On: 01/18/2015 16:57    Review of Systems  Constitutional: Negative.   HENT: Negative.   Eyes: Negative.   Cardiovascular: Negative.   Gastrointestinal: Positive for nausea, vomiting and abdominal pain. Negative for diarrhea.  Musculoskeletal: Negative.   Skin: Negative.   Neurological: Negative.    Blood pressure 122/63, pulse 66, temperature 97.8 F (36.6 C), temperature source Oral, resp. rate  22, height 5' 6"  (1.676 m), weight 81.149 kg (178 lb 14.4 oz), SpO2 98 %. Physical Exam  Constitutional: She is oriented to person, place, and time. She appears well-developed and well-nourished.  HENT:  Head: Normocephalic and atraumatic.  Eyes: Conjunctivae and EOM are normal. Pupils are equal, round, and reactive to light.  Neck: Normal range of motion. Neck supple.  Cardiovascular: Normal rate, regular rhythm and normal heart sounds.   Respiratory: Effort normal and breath sounds normal.  GI: Soft. She exhibits distension. Tenderness: x4Q. There is no rebound and no guarding.  Musculoskeletal: Normal range of motion.  Neurological: She is alert and oriented to person, place, and time.    Assessment/Plan: A 81 year old female with complicated medical issues and currently with a sigmoid mass versus stricture.   Sigmoid obstruction STEMI Right adrenal Mass. CHF COPD  1. At this time patient has no emergent, peritoneal signs for surgery. Patient has NG tube in place. Agree with stopping anticoagulation and Plavix at this time. Patient is to undergo sigmoidoscopy today for possible mass. 2. A long discussion the patient that she will most likely require a Hartmann type procedure secondary to the dilatation of her proximal bowel. I did discuss her she is a high-risk operative candidate as well. 3. We'll follow along with you.  Rosario Jacks., Kenshin Splawn 01/19/2015, 3:55 PM

## 2015-01-19 NOTE — Progress Notes (Signed)
Echocardiogram 2D Echocardiogram has been performed.  Joelene Millin 01/19/2015, 1:01 PM

## 2015-01-19 NOTE — Progress Notes (Signed)
[place] TEAM 1 - Stepdown/ICU TEAM Progress Note  Haley Harris HLK:[id number] DOB: [birth year] DOA: 01/18/2015 PCP: [name], MD  Admit HPI / Brief Narrative: 81 year old WF PMHx Anxiety, Combined Systolic and Diastolic CHF, HTN, HLD, Carotid Artery Stenosis, CAD with prior NSTEMI, Dyslipidemia, PVD associated neuropathy, COPD, GERD, Osteoporosis and Chronic Venous insufficiency.   Patient presented to the ER today because of shortness of breath and abdominal pain. Patient reports the abdominal symptoms began abruptly last night with swelling and discomfort with abdominal pain radiating up into the chest. She describes a bandlike tight pain in the upper abdomen as well as suprapubic discomfort. Her last BM was 2 days ago. She really has not been passing any flatus and has had excessive belching and reflux since onset of symptoms. She became short of breath after the development of the abdominal distention. She reports left lower extremity swelling for about one month. She has been unable to wear her support hose due to the swelling causing discomfort. After onset of abdominal distention she did have dry heaving and has noticed poor oral intake. She has not had any chest pain, palpitations or shortness of breath symptoms prior to onset of abdominal distention. She denies dysuria, hematuria, previous blood or dark stools.  In the ER patient had a low-grade temperature of 99, she was slightly tachycardic with heart rate of 105, respirations were 17, she was mildly hypertensive initially with a blood pressure 154/99 but after she was given IV morphine for pain and Zofran for nausea her tachycardia decreased with a heart rate now 95 and blood pressure down to 114/60. She's had room air saturations between 92 and 98%. A CT of the abdomen revealed a distal colonic obstruction in the mid sigmoid colon with appearance that is highly suspicious for obstructing colon adenocarcinoma. There was significant  colonic distention with air in the colon. There was also an incidental finding of 3.1 cm indeterminate right adrenal mass along with tiny hepatic cysts. Radiologist recommended abdominal MRI with and without contrast for further evaluation. Because of her shortness of breath and history of heart failure/COPD and chest x-ray was ordered but this did not demonstrate any evidence of edema or consolidation. Laboratory data revealed a new hyponatremia with a sodium 128 but BUN and creatinine at baseline of 34/1.10. BNP was slightly elevated at 1600 and troponin was mildly elevated at 0.61, lipase was normal at 20. LFTs were normal except for mild elevation in total bilirubin at 1.3 with an indirect bilirubin of 1.1 direct bilirubin 0.2. CBC was normal except for elevated neutrophils 90%. Urinalysis was abnormal with moderate bilirubin Amber color and 40 ketones but no evidence of UTI. Because of the obstructing mass seen on CT, EDP/Dr. Roderic Palau has called the patient's primary gastroenterologist Dr. Earlean Shawl will come see the patient during the hospitalization. Because of the abnormal troponin the EDP also notified the patient's cardiologist Dr. Terrence Dupont of need for consultation.  HPI/Subjective: 7/9  A/O 4, negative CP P, negative SOB, positive abdominal pain but significantly improved from yesterday, negative N/V.  Assessment/Plan: Colonic obstruction mid sigmoid -CT appears consistent with obstructing mass; c/w neoplasm. -Continue NG tube to low wall suction (sectioning dark frank blood)  -NPO -Begin D5-0.9% saline  @ 50 mL per hour -Check CEA -CT chest without contrast pending  -Sigmoidoscopy planned for Monday, 01/21/2015 -Dr.Armando Rosendo Gros ([facility]) has been consulted  Acute blood loss anemia/GI bleed -Patient NG tube is sectioning dark frank blood. -Patient hemoglobin has dropped from 13.6 on  admission ---> 10.2  -Transfuse if hemoglobin<8 -Type and cross -DC Pepcid  -Start Protonix 40 mg BID: Once  we obtain better IV access will start Protonix drip -Although patient at increased risk for DVT/PE secondary to suspected malignancy, patient currently has GI bleed will hold anticoagulate  Acute hyponatremia -New problem and suspect related to volume shifting in setting of acute bowel obstruction -Patient was on Lexapro as well as Lasix/Aldactone prior to admission which also could contribute -Holding diuretics in setting of volume depletion and colon obstruction -Check serum and urine osmolality, urine sodium and creatinine; R/O SIADH   Leg edema, left -LLE Doppler negative for DVT/SVT   Right adrenal mass -Indeterminate in nature and radiologist recommends MRI with and without contrast -Patient has underlying chronic kidney disease so need to monitor renal function closely if contrasted study undertaken -MRI with contrast pending (MR contrast not nephrotoxic)  CAD in native artery/elevated troponin -Cardiac enzymes trending down, continue to monitor  -Current EKG nonischemic  Chronic combined systolic and diastolic CHF, NYHA class 2 -Based on chest x-ray and current clinical exam patient is compensated -NPO so diuretics and ARB on hold - most recent 2D echo with EF 40-45% -Echocardiogram pending - Monitor closely for fluid overload  -Strict in and out -Daily weight standing  CKD , stage III -Current renal function stable and at baseline  Dyslipidemia -Nothing by mouth so preadmission statin on hold  COPD -Currently compensated without evidence of wheezing or hypoxemia -Continue albuterol nebs PRN  Neuropathy due to peripheral vascular disease -Insetting of nothing by mouth preadmission Neurontin on hold    Code Status: FULL Family Communication: Daughter present at time of exam Disposition Plan: Per surgery    Consultants: Dr.[facility] (cardiology) Dr. Richmond Campbell (GI) Dr.Armando Rosendo Gros ([facility])     Procedure/Significant Events: 7/8 CT abdomen pelvis  with contrast;-Distal colonic obstruction mid sigmoid colon, highly suspicious for obstructing colon adenocarcinoma. -Sigmoid diverticulosis. -No definite evidence of metastatic disease within the abdomen or pelvis. -3.1 cm indeterminate right adrenal mass and probable tiny hepatic cysts.  7/9 LLE venous Doppler; negative DVT/SVT    Culture   Antibiotics:   DVT prophylaxis: SCD   Devices    LINES / TUBES:      Continuous Infusions: . dextrose 5 % and 0.9% NaCl 50 mL/hr at 01/19/15 1415  . pantoprozole (PROTONIX) infusion      Objective: VITAL SIGNS: Temp: 97.1 F (36.2 C) (07/09 2009) Temp Source: Axillary (07/09 2009) BP: 118/64 mmHg (07/09 2009) Pulse Rate: 84 (07/09 2009) SPO2; FIO2:   Intake/Output Summary (Last 24 hours) at 01/19/15 2014 Last data filed at 01/19/15 1900  Gross per 24 hour  Intake 1406.2 ml  Output    400 ml  Net 1006.2 ml     Exam: General: A/O 4, NAD, No acute respiratory distress Eyes: Positive headache, negative eye pain, double vision, negative retinal hemorrhage ENT: Negative Runny nose, negative ear pain, negative tinnitus, negative gingival bleeding, NG tube present suctioning dark frank blood Neck:  Negative scars, masses, torticollis, lymphadenopathy, JVD Lungs: Clear to auscultation bilaterally without wheezes or crackles Cardiovascular: Regular rate and rhythm without murmur gallop or rub normal S1 and S2 Abdomen: Positive abdominal pain to palpation greatest in the LUQ, negative dysphagia, positive distention, firm to palpation, positive bowel sounds positive, no rebound, no ascites, no appreciable mass Extremities: No significant cyanosis, clubbing, or edema bilateral lower extremities Psychiatric:  Negative depression, negative anxiety, negative fatigue, negative mania Neurologic:  Cranial nerves II  through XII intact, tongue/uvula midline, all extremities muscle strength 5/5, sensation intact throughout, negative  dysarthria, negative expressive aphasia, negative receptive aphasia.      Data Reviewed: Basic Metabolic Panel:  Recent Labs Lab 01/18/15 1243 01/18/15 2333 01/19/15 0434  NA 128*  --  127*  K 3.9  --  4.4  CL 97*  --  97*  CO2  --   --  21*  GLUCOSE 151*  --  138*  BUN 34*  --  32*  CREATININE 1.10*  --  1.17*  CALCIUM  --   --  7.4*  MG  --  2.4  --   PHOS  --  2.7  --    Liver Function Tests:  Recent Labs Lab 01/18/15 1228 01/19/15 0434  AST 28 23  ALT 14 11*  ALKPHOS 52 38  BILITOT 1.3* 0.6  PROT 7.1 5.9*  ALBUMIN 4.0 3.2*    Recent Labs Lab 01/18/15 1228  LIPASE 20*   No results for input(s): AMMONIA in the last 168 hours. CBC:  Recent Labs Lab 01/18/15 1228 01/18/15 1243 01/19/15 0434  WBC 8.7  --  11.7*  NEUTROABS 7.8*  --   --   HGB 12.1 13.6 10.2*  HCT 35.3* 40.0 30.5*  MCV 94.6  --  96.2  PLT 238  --  187   Cardiac Enzymes:  Recent Labs Lab 01/18/15 1926 01/18/15 2333 01/19/15 0434  TROPONINI 0.76* 0.60* 0.40*   BNP (last 3 results)  Recent Labs  01/18/15 1228  BNP 1610.0*    ProBNP (last 3 results)  Recent Labs  03/01/14 1930  PROBNP 2785.0*    CBG:  Recent Labs Lab 01/18/15 2033 01/18/15 2352 01/19/15 0450  GLUCAP 127* 126* 131*    Recent Results (from the past 240 hour(s))  MRSA PCR Screening     Status: None   Collection Time: 01/18/15  6:58 PM  Result Value Ref Range Status   MRSA by PCR NEGATIVE NEGATIVE Final    Comment:        The GeneXpert MRSA Assay (FDA approved for NASAL specimens only), is one component of a comprehensive MRSA colonization surveillance program. It is not intended to diagnose MRSA infection nor to guide or monitor treatment for MRSA infections.      Studies:  Recent x-ray studies have been reviewed in detail by the Attending Physician  Scheduled Meds:  Scheduled Meds: . sodium chloride   Intravenous Once  . aspirin EC  81 mg Oral Daily  . atorvastatin  20 mg  Oral q1800  . carvedilol  3.125 mg Oral BID WC  . folic acid  1 mg Intravenous Daily  . [START ON 01/23/2015] pantoprazole (PROTONIX) IV  40 mg Intravenous Q12H  . sodium chloride  10-40 mL Intracatheter Q12H  . sodium chloride  3 mL Intravenous Q12H  . thiamine  100 mg Intravenous Daily    Time spent on care of this patient: 40 mins   Jakson Delpilar, Geraldo Docker , MD  [facility] Office  [phone] Pager - [phone]  On-Call/Text Page:      [email or web address]      password [email or web address]  If 7PM-7AM, please contact night-coverage [email or web address] Password [email or web address] 01/19/2015, 8:14 PM   LOS: 1 day   Care during the described time interval was provided by me .  I have reviewed this patient's available data, including medical history, events of note, physical examination, and all test results as part of my evaluation. I have personally reviewed  and interpreted all radiology studies.   Dia Crawford, MD [phone] Pager

## 2015-01-20 DIAGNOSIS — C799 Secondary malignant neoplasm of unspecified site: Secondary | ICD-10-CM | POA: Diagnosis present

## 2015-01-20 DIAGNOSIS — C801 Malignant (primary) neoplasm, unspecified: Secondary | ICD-10-CM

## 2015-01-20 DIAGNOSIS — G63 Polyneuropathy in diseases classified elsewhere: Secondary | ICD-10-CM

## 2015-01-20 LAB — CBC
HCT: 27 % — ABNORMAL LOW (ref 36.0–46.0)
Hemoglobin: 8.9 g/dL — ABNORMAL LOW (ref 12.0–15.0)
MCH: 32.6 pg (ref 26.0–34.0)
MCHC: 33 g/dL (ref 30.0–36.0)
MCV: 98.9 fL (ref 78.0–100.0)
PLATELETS: 151 10*3/uL (ref 150–400)
RBC: 2.73 MIL/uL — AB (ref 3.87–5.11)
RDW: 13.2 % (ref 11.5–15.5)
WBC: 7.5 10*3/uL (ref 4.0–10.5)

## 2015-01-20 LAB — PROTIME-INR
INR: 1.15 (ref 0.00–1.49)
Prothrombin Time: 14.9 seconds (ref 11.6–15.2)

## 2015-01-20 LAB — TROPONIN I: Troponin I: 0.07 ng/mL — ABNORMAL HIGH (ref <0.031)

## 2015-01-20 NOTE — Progress Notes (Signed)
[facility] Progress Note    Subjective: Patient is much mmore comfortable this AM.    Objective: Vital signs in last 24 hours: Temp:  [87.2 F (30.7 C)-98 F (36.7 C)] 97.7 F (36.5 C) (07/10 0437) Pulse Rate:  [66-84] 68 (07/10 0437) Resp:  [15-22] 15 (07/10 0437) BP: (103-122)/(51-64) 120/63 mmHg (07/10 0437) SpO2:  [94 %-99 %] 99 % (07/10 0437) Weight:  [80.2 kg (176 lb 12.9 oz)] 80.2 kg (176 lb 12.9 oz) (07/10 0401) Last BM Date: 01/18/15  Intake/Output from previous day: 07/09 0701 - 07/10 0700 In: 1560.2 [I.V.:1410.2; NG/GT:150] Out: 550 [Urine:500; Emesis/NG output:50] Intake/Output this shift:    General: No acute distress.  Very pleasant patient.    Lungs: Clear to auscultation  Abd: Soft, distended, good bowel sounds.  Has been passing flatus.  No tenderness.  Extremities: No changes  Neuro: Intact  Lab Results:  @LABLAST2 (wbc:2,hgb:2,hct:2,plt:2) BMET  Recent Labs  01/18/15 1243 01/19/15 0434  NA 128* 127*  K 3.9 4.4  CL 97* 97*  CO2  --  21*  GLUCOSE 151* 138*  BUN 34* 32*  CREATININE 1.10* 1.17*  CALCIUM  --  7.4*   PT/INR  Recent Labs  01/18/15 1228  LABPROT 14.2  INR 1.08   ABG No results for input(s): PHART, HCO3 in the last 72 hours.  Invalid input(s): PCO2, PO2  Studies/Results: Dg Chest 2 View  01/18/2015   CLINICAL DATA:  Increasing shortness of breath with exertion  EXAM: CHEST  2 VIEW  COMPARISON:  March 03, 2014  FINDINGS: There is mild scarring in the left base region. There is no edema or consolidation. Heart is upper normal in size with pulmonary vascularity within normal limits. There is atherosclerotic change in the aorta. No adenopathy.  There are wedge compression fractures in the mid and lower thoracic spine with increase in kyphosis. There is evidence of old fractures of each proximal humeral metaphysis, stable. There is calcification in the left carotid artery.  IMPRESSION: Scarring left base. No edema or  consolidation. No change in cardiac silhouette. Old fractures as noted. Increase in thoracic kyphosis. Calcification left carotid artery.   Electronically Signed   By: Lowella Grip III M.D.   On: 01/18/2015 14:01   Ct Chest Wo Contrast  01/19/2015   CLINICAL DATA:  Sigmoid colon mass, possible adenocarcinoma, evaluate for metastasis  EXAM: CT CHEST WITHOUT CONTRAST  TECHNIQUE: Multidetector CT imaging of the chest was performed following the standard protocol without IV contrast.  COMPARISON:  Chest radiographs dated 01/18/2015  FINDINGS: Mediastinum/Nodes: The heart is top-normal in size. Trace pleural fluid.  Coronary atherosclerosis.  Atherosclerotic calcifications of the aortic arch.  No suspicious mediastinal or axillary lymphadenopathy.  Visualized thyroid is unremarkable.  Lungs/Pleura: Negative mild patchy bibasilar opacities, likely atelectasis. No focal consolidation.  Trace bilateral pleural effusions.  No suspicious pulmonary nodules.  No pneumothorax.  Upper abdomen: Enteric tube coursing into the proximal stomach. Visualized upper abdomen is otherwise unchanged from recent CT, noting a 3.0 cm right adrenal nodule/mass.  Musculoskeletal: Severe compression fracture deformities at T6 and T12. Moderate compression fracture deformity at T8.  IMPRESSION: No evidence of metastatic disease in the chest.  Trace bilateral pleural effusions.  Severe compression fracture deformities at T6 and T12. Moderate compression fracture deformity at T8.   Electronically Signed   By: Julian Hy M.D.   On: 01/19/2015 15:19   Ct Abdomen Pelvis W Contrast  01/18/2015   CLINICAL DATA:  Lower abdominal pain, swelling, and  cramping since yesterday.  EXAM: CT ABDOMEN AND PELVIS WITH CONTRAST  TECHNIQUE: Multidetector CT imaging of the abdomen and pelvis was performed using the standard protocol following bolus administration of intravenous contrast.  CONTRAST:  68mL OMNIPAQUE IOHEXOL 300 MG/ML  SOLN  COMPARISON:   None.  FINDINGS: Lower Chest: Cardiomegaly and mild dependent bibasilar atelectasis noted.  Hepatobiliary: Probable tiny sub-cm hepatic cysts noted, but no definite liver masses are identified. Gallbladder is unremarkable.  Pancreas: No mass, inflammatory changes, or other significant abnormality identified.  Spleen:  Within normal limits in size and appearance.  Adrenals: 3.1 cm right adrenal mass is seen which has nonspecific features and enhancement characteristics.  Kidneys/Urinary Tract:  No evidence of masses or hydronephrosis.  Stomach/Bowel/Peritoneum: Small hiatal hernia noted. There is diffuse colonic dilatation. Abrupt transition with shoulder ring is seen in the mid sigmoid colon where there is evidence of annular colonic wall thickening measuring approximately 3.9 cm in length. This is highly suspicious for obstructing colon carcinoma. Sigmoid diverticulosis is demonstrated, however there is no evidence of diverticulitis. No evidence of small bowel obstruction.  Vascular/Lymphatic: No pathologically enlarged lymph nodes identified. 8 mm left common iliac lymph node noted on image 49/ series 201, which is not considered pathologically enlarged. No abdominal aortic aneurysm or other significant retroperitoneal abnormality demonstrated.  Reproductive: Prior hysterectomy noted. Adnexal regions are unremarkable in appearance. Tiny amount of free fluid seen in the dependent pelvis.  Other:  None.  Musculoskeletal: No suspicious bone lesions identified. Lower lumbar spine fusion hardware and old T12 vertebral body compression fracture noted.  IMPRESSION: Distal colonic obstruction in the mid sigmoid colon, with appearance highly suspicious for obstructing colon adenocarcinoma. Sigmoidoscopy or colonoscopy recommended for further evaluation.  Sigmoid diverticulosis. No radiographic evidence of diverticulitis.  No definite evidence of metastatic disease within the abdomen or pelvis.  3.1 cm indeterminate right  adrenal mass and probable tiny hepatic cysts. Abdomen MRI without and with contrast recommended for further evaluation.   Electronically Signed   By: Earle Gell M.D.   On: 01/18/2015 15:15   Dg Abd Portable 1v  01/18/2015   CLINICAL DATA:  Nausea and abdominal pain since yesterday.  EXAM: PORTABLE ABDOMEN - 1 VIEW  COMPARISON:  CT abdomen and pelvis from the same day.  FINDINGS: NG tube is in place. Moderate distention of the colon per cysts, similar to the topogram of the CT scan. There is no definite free air. Contrast can be seen within the distal small bowel. Surgical changes are noted in the lumbar spine. Atherosclerotic changes are present in the aorta.  IMPRESSION: Similar appearance of distal colonic obstruction.  NG tube in place.   Electronically Signed   By: San Morelle M.D.   On: 01/18/2015 16:57    Anti-infectives: Anti-infectives    None      Assessment/Plan: s/p  Continue NGT for now  Large bowel obstruction which appears to be near an area of significant diverticulosis in the proximal sigmoid colon, but the fact that she is anemia make me concerned for possible malignancy.   Needs sigmoidoscopy which is planned for tomorrow.  No acute distress currently or any immediate need for surgical intervention.  LOS: 2 days   Kathryne Eriksson. Dahlia Bailiff, MD, FACS [phone] [facility] Surgery 01/20/2015

## 2015-01-20 NOTE — Progress Notes (Signed)
Subjective:  Up in chair denies any chest pain or shortness of breath.  Objective:  Vital Signs in the last 24 hours: Temp:  [87.2 F (30.7 C)-98.5 F (36.9 C)] 98.5 F (36.9 C) (07/10 0943) Pulse Rate:  [66-84] 76 (07/10 0943) Resp:  [15-22] 19 (07/10 0943) BP: (103-123)/(57-64) 123/58 mmHg (07/10 0943) SpO2:  [94 %-99 %] 95 % (07/10 0943) Weight:  [80.2 kg (176 lb 12.9 oz)] 80.2 kg (176 lb 12.9 oz) (07/10 0401)  Intake/Output from previous day: 07/09 0701 - 07/10 0700 In: 1560.2 [I.V.:1410.2; NG/GT:150] Out: 550 [Urine:500; Emesis/NG output:50] Intake/Output from this shift:    Physical Exam: Neck: no adenopathy, no carotid bruit, no JVD and supple, symmetrical, trachea midline Lungs: clear to auscultation bilaterally Heart: regular rate and rhythm, S1, S2 normal and Soft systolic murmur noted Abdomen: Soft distended bowel sounds present Extremities: No clubbing cyanosis left leg edema noted  Lab Results:  Recent Labs  01/19/15 0434 01/20/15 0458  WBC 11.7* 7.5  HGB 10.2* 8.9*  PLT 187 151    Recent Labs  01/18/15 1243 01/19/15 0434  NA 128* 127*  K 3.9 4.4  CL 97* 97*  CO2  --  21*  GLUCOSE 151* 138*  BUN 34* 32*  CREATININE 1.10* 1.17*    Recent Labs  01/18/15 2333 01/19/15 0434  TROPONINI 0.60* 0.40*   Hepatic Function Panel  Recent Labs  01/18/15 1228 01/19/15 0434  PROT 7.1 5.9*  ALBUMIN 4.0 3.2*  AST 28 23  ALT 14 11*  ALKPHOS 52 38  BILITOT 1.3* 0.6  BILIDIR 0.2  --   IBILI 1.1*  --    No results for input(s): CHOL in the last 72 hours. No results for input(s): PROTIME in the last 72 hours.  Imaging: Imaging results have been reviewed and Dg Chest 2 View  01/18/2015   CLINICAL DATA:  Increasing shortness of breath with exertion  EXAM: CHEST  2 VIEW  COMPARISON:  March 03, 2014  FINDINGS: There is mild scarring in the left base region. There is no edema or consolidation. Heart is upper normal in size with pulmonary vascularity  within normal limits. There is atherosclerotic change in the aorta. No adenopathy.  There are wedge compression fractures in the mid and lower thoracic spine with increase in kyphosis. There is evidence of old fractures of each proximal humeral metaphysis, stable. There is calcification in the left carotid artery.  IMPRESSION: Scarring left base. No edema or consolidation. No change in cardiac silhouette. Old fractures as noted. Increase in thoracic kyphosis. Calcification left carotid artery.   Electronically Signed   By: Lowella Grip III M.D.   On: 01/18/2015 14:01   Ct Chest Wo Contrast  01/19/2015   CLINICAL DATA:  Sigmoid colon mass, possible adenocarcinoma, evaluate for metastasis  EXAM: CT CHEST WITHOUT CONTRAST  TECHNIQUE: Multidetector CT imaging of the chest was performed following the standard protocol without IV contrast.  COMPARISON:  Chest radiographs dated 01/18/2015  FINDINGS: Mediastinum/Nodes: The heart is top-normal in size. Trace pleural fluid.  Coronary atherosclerosis.  Atherosclerotic calcifications of the aortic arch.  No suspicious mediastinal or axillary lymphadenopathy.  Visualized thyroid is unremarkable.  Lungs/Pleura: Negative mild patchy bibasilar opacities, likely atelectasis. No focal consolidation.  Trace bilateral pleural effusions.  No suspicious pulmonary nodules.  No pneumothorax.  Upper abdomen: Enteric tube coursing into the proximal stomach. Visualized upper abdomen is otherwise unchanged from recent CT, noting a 3.0 cm right adrenal nodule/mass.  Musculoskeletal: Severe compression fracture  deformities at T6 and T12. Moderate compression fracture deformity at T8.  IMPRESSION: No evidence of metastatic disease in the chest.  Trace bilateral pleural effusions.  Severe compression fracture deformities at T6 and T12. Moderate compression fracture deformity at T8.   Electronically Signed   By: Julian Hy M.D.   On: 01/19/2015 15:19   Mr Abdomen W Wo  Contrast  01/20/2015   CLINICAL DATA:  Indeterminate right adrenal mass seen on recent CT. Distal colonic obstruction due to colon carcinoma.  EXAM: MRI ABDOMEN WITHOUT AND WITH CONTRAST  TECHNIQUE: Multiplanar multisequence MR imaging of the abdomen was performed both before and after the administration of intravenous contrast.  CONTRAST:  37mL MULTIHANCE GADOBENATE DIMEGLUMINE 529 MG/ML IV SOLN  COMPARISON:  AP CT on 01/18/2015  FINDINGS: Lower chest: No acute findings.  Hepatobiliary: A few tiny cysts scattered sub-cm hepatic cysts are noted, however no liver masses are identified.  Pancreas: No masses, inflammatory changes, or fluid collections demonstrated.  Spleen:  Within normal limits in size and appearance.  Adrenal Glands: 3.0 cm right adrenal mass is seen which shows peripheral enhancement and central cystic foci. No macroscopic fat is seen. No evidence of signal dropout on chemical shift imaging. These features are nonspecific. Left adrenal gland is normal in appearance.  Kidneys: No renal masses identified. No evidence of hydronephrosis.  Stomach/Bowel/Peritoneum: Mild dilatation of colonic bowel loops within abdomen again noted.  Vascular/Lymphatic: No pathologically enlarged lymph nodes identified. No abdominal aortic aneurysm or other significant retroperitoneal abnormality demonstrated.  Other:  None.  Musculoskeletal:  No suspicious bone lesions identified.  IMPRESSION: 3.0 cm right adrenal mass has nonspecific MR characteristics. Differential diagnosis still includes atypical adrenal adenoma, with adrenal metastasis considered less likely. Consider PET-CT scan for further evaluation, or continued followup by CT in 6 months.   Electronically Signed   By: Earle Gell M.D.   On: 01/20/2015 09:28   Ct Abdomen Pelvis W Contrast  01/18/2015   CLINICAL DATA:  Lower abdominal pain, swelling, and cramping since yesterday.  EXAM: CT ABDOMEN AND PELVIS WITH CONTRAST  TECHNIQUE: Multidetector CT imaging of  the abdomen and pelvis was performed using the standard protocol following bolus administration of intravenous contrast.  CONTRAST:  71mL OMNIPAQUE IOHEXOL 300 MG/ML  SOLN  COMPARISON:  None.  FINDINGS: Lower Chest: Cardiomegaly and mild dependent bibasilar atelectasis noted.  Hepatobiliary: Probable tiny sub-cm hepatic cysts noted, but no definite liver masses are identified. Gallbladder is unremarkable.  Pancreas: No mass, inflammatory changes, or other significant abnormality identified.  Spleen:  Within normal limits in size and appearance.  Adrenals: 3.1 cm right adrenal mass is seen which has nonspecific features and enhancement characteristics.  Kidneys/Urinary Tract:  No evidence of masses or hydronephrosis.  Stomach/Bowel/Peritoneum: Small hiatal hernia noted. There is diffuse colonic dilatation. Abrupt transition with shoulder ring is seen in the mid sigmoid colon where there is evidence of annular colonic wall thickening measuring approximately 3.9 cm in length. This is highly suspicious for obstructing colon carcinoma. Sigmoid diverticulosis is demonstrated, however there is no evidence of diverticulitis. No evidence of small bowel obstruction.  Vascular/Lymphatic: No pathologically enlarged lymph nodes identified. 8 mm left common iliac lymph node noted on image 49/ series 201, which is not considered pathologically enlarged. No abdominal aortic aneurysm or other significant retroperitoneal abnormality demonstrated.  Reproductive: Prior hysterectomy noted. Adnexal regions are unremarkable in appearance. Tiny amount of free fluid seen in the dependent pelvis.  Other:  None.  Musculoskeletal: No suspicious bone  lesions identified. Lower lumbar spine fusion hardware and old T12 vertebral body compression fracture noted.  IMPRESSION: Distal colonic obstruction in the mid sigmoid colon, with appearance highly suspicious for obstructing colon adenocarcinoma. Sigmoidoscopy or colonoscopy recommended for  further evaluation.  Sigmoid diverticulosis. No radiographic evidence of diverticulitis.  No definite evidence of metastatic disease within the abdomen or pelvis.  3.1 cm indeterminate right adrenal mass and probable tiny hepatic cysts. Abdomen MRI without and with contrast recommended for further evaluation.   Electronically Signed   By: Earle Gell M.D.   On: 01/18/2015 15:15   Dg Abd Portable 1v  01/18/2015   CLINICAL DATA:  Nausea and abdominal pain since yesterday.  EXAM: PORTABLE ABDOMEN - 1 VIEW  COMPARISON:  CT abdomen and pelvis from the same day.  FINDINGS: NG tube is in place. Moderate distention of the colon per cysts, similar to the topogram of the CT scan. There is no definite free air. Contrast can be seen within the distal small bowel. Surgical changes are noted in the lumbar spine. Atherosclerotic changes are present in the aorta.  IMPRESSION: Similar appearance of distal colonic obstruction.  NG tube in place.   Electronically Signed   By: San Morelle M.D.   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 Ischemic cardiomyopathy. Hypertension. Hypercholesterolemia. COPD History of cerebrovascular disease. History of CVA of breast. Spinal stenosis. Vitamin D deficiency. Osteoporosis. History of compression fracture of thoracic spine with kyphosis Plan Continue present management Schedule for colonoscopy in a.m. Patient will be high operative risk in view of multiple comorbidities prior MIs with mildly depressed LV systolic function and possible small non-Q-wave MI. Discussed with patient and family and understand.  LOS: 2 days    Charolette Forward 01/20/2015, 10:45 AM

## 2015-01-20 NOTE — Progress Notes (Signed)
[facility] TEAM 1 - Stepdown/ICU TEAM Progress Note  Haley Harris OFB:[id number] DOB: Jun 07, 1929 DOA: 01/18/2015 PCP: [name], MD  Admit HPI / Brief Narrative: 81 year old WF PMHx Anxiety, Combined Systolic and Diastolic CHF, HTN, HLD, Carotid Artery Stenosis, CAD with prior NSTEMI, Dyslipidemia, PVD associated neuropathy, COPD, GERD, Osteoporosis and Chronic Venous insufficiency.   Patient presented to the ER today because of shortness of breath and abdominal pain. Patient reports the abdominal symptoms began abruptly last night with swelling and discomfort with abdominal pain radiating up into the chest. She describes a bandlike tight pain in the upper abdomen as well as suprapubic discomfort. Her last BM was 2 days ago. She really has not been passing any flatus and has had excessive belching and reflux since onset of symptoms. She became short of breath after the development of the abdominal distention. She reports left lower extremity swelling for about one month. She has been unable to wear her support hose due to the swelling causing discomfort. After onset of abdominal distention she did have dry heaving and has noticed poor oral intake. She has not had any chest pain, palpitations or shortness of breath symptoms prior to onset of abdominal distention. She denies dysuria, hematuria, previous blood or dark stools.  In the ER patient had a low-grade temperature of 99, she was slightly tachycardic with heart rate of 105, respirations were 17, she was mildly hypertensive initially with a blood pressure 154/99 but after she was given IV morphine for pain and Zofran for nausea her tachycardia decreased with a heart rate now 95 and blood pressure down to 114/60. She's had room air saturations between 92 and 98%. A CT of the abdomen revealed a distal colonic obstruction in the mid sigmoid colon with appearance that is highly suspicious for obstructing colon adenocarcinoma. There was significant  colonic distention with air in the colon. There was also an incidental finding of 3.1 cm indeterminate right adrenal mass along with tiny hepatic cysts. Radiologist recommended abdominal MRI with and without contrast for further evaluation. Because of her shortness of breath and history of heart failure/COPD and chest x-ray was ordered but this did not demonstrate any evidence of edema or consolidation. Laboratory data revealed a new hyponatremia with a sodium 128 but BUN and creatinine at baseline of 34/1.10. BNP was slightly elevated at 1600 and troponin was mildly elevated at 0.61, lipase was normal at 20. LFTs were normal except for mild elevation in total bilirubin at 1.3 with an indirect bilirubin of 1.1 direct bilirubin 0.2. CBC was normal except for elevated neutrophils 90%. Urinalysis was abnormal with moderate bilirubin Amber color and 40 ketones but no evidence of UTI. Because of the obstructing mass seen on CT, EDP/Dr. Roderic Palau has called the patient's primary gastroenterologist Dr. Earlean Shawl will come see the patient during the hospitalization. Because of the abnormal troponin the EDP also notified the patient's cardiologist Dr. Terrence Dupont of need for consultation.  HPI/Subjective: 7/10  A/O 4, negative CP P, negative SOB, positive abdominal pain but significantly improved from yesterday, negative N/V.  Assessment/Plan: Colonic obstruction mid sigmoid -CT appears consistent with obstructing mass; c/w neoplasm. -Continue NG tube to low wall suction (dark frank blood appears to have stopped; more bilious)  -NPO -Continue D5-0.9% saline  @ 50 mL per hour -CEA; negative -CT chest without contrast; negative metastatic disease see results below   -Sigmoidoscopy planned for Monday, 01/21/2015 -Dr.Armando Rosendo Gros ([facility]) consulted; will await findings of sigmoidoscopy before finalizing plan.  Acute blood loss  anemia/GI bleed -Patient NG tube now suctioning more bilious fluid instead of blood. -Patient  hemoglobin trended down overnight but appears to have stabilized. -Transfuse if hemoglobin<8 -Continue Protonix drip -Although patient at increased risk for DVT/PE secondary to suspected malignancy, patient currently has GI bleed will hold anticoagulate  Acute hyponatremia -New problem and suspect related to volume shifting in setting of acute bowel obstruction -Patient was on Lexapro as well as Lasix/Aldactone prior to admission which also could contribute; held -Holding diuretics  -Check serum and urine osmolality, urine sodium and creatinine; R/O SIADH   Leg edema, left -LLE Doppler negative for DVT/SVT   Right adrenal mass -Patient has underlying chronic kidney disease; monitor function closely -MRI abdomen pelvis nondiagnostic see results below  CAD in native artery/elevated troponin -Cardiac enzymes trending down, continue to monitor  -Current EKG nonischemic  Chronic combined systolic and diastolic CHF, NYHA class 2 -Based on chest x-ray and current clinical exam patient is compensated; most recent 2D echo with EF 40-45% - Monitor closely for fluid overload  -Strict in and out since admission + 1.5 L -Daily weight standing  CKD , stage III -Current renal function stable and at baseline  Dyslipidemia -Nothing by mouth so preadmission statin on hold  COPD -Currently compensated without evidence of wheezing or hypoxemia -Continue albuterol nebs PRN  Neuropathy due to peripheral vascular disease -Insetting of nothing by mouth preadmission Neurontin on hold    Code Status: FULL Family Communication: Daughter present at time of exam Disposition Plan: Per surgery    Consultants: Dr.M[facility] (cardiology) Dr. Richmond Campbell (GI) Dr.Armando Rosendo Gros ([facility])     Procedure/Significant Events: 7/8 CT abdomen pelvis with contrast;-Distal colonic obstruction mid sigmoid colon, highly suspicious for obstructing colon adenocarcinoma. -Sigmoid diverticulosis. -No  definite evidence of metastatic disease within the abdomen or pelvis. -3.1 cm indeterminate right adrenal mass and probable tiny hepatic cysts.  7/9 LLE venous Doppler; negative DVT/SVT 7/9 MRI abdomen W/Wo contrast; -3.0 cm right adrenal mass nonspecific MR characteristics. -atypical adrenal adenoma, vs adrenal metastasis.  -Consider PET-CT scan for further evaluation, or continued followup by CT in 6 months. 7/9 CT chest WO contrast; negative metastatic disease in the chest. -Severe compression fracture deformities at T6 and T12. Moderate compression fracture deformity at T8. 7/9 echocardiogram;- Left ventricle: mild concentric hypertrophy. LVEF= 40% to 45%.  - (grade 1 diastolic dysfunction).   Culture   Antibiotics:   DVT prophylaxis: SCD   Devices    LINES / TUBES:      Continuous Infusions: . dextrose 5 % and 0.9% NaCl 50 mL/hr at 01/19/15 1415  . pantoprozole (PROTONIX) infusion 8 mg/hr (01/20/15 0618)    Objective: VITAL SIGNS: Temp: 98.1 F (36.7 C) (07/10 1547) Temp Source: Oral (07/10 1547) BP: 142/69 mmHg (07/10 1600) Pulse Rate: 65 (07/10 1600) SPO2; FIO2:   Intake/Output Summary (Last 24 hours) at 01/20/15 1727 Last data filed at 01/20/15 1600  Gross per 24 hour  Intake 1034.17 ml  Output    250 ml  Net 784.17 ml     Exam: General: A/O 4, NAD, No acute respiratory distress Eyes: Positive headache, negative eye pain, double vision, negative retinal hemorrhage ENT: Negative Runny nose, negative ear pain, negative tinnitus, negative gingival bleeding, NG tube present suctioning dark frank blood Neck:  Negative scars, masses, torticollis, lymphadenopathy, JVD Lungs: Clear to auscultation bilaterally without wheezes or crackles Cardiovascular: Regular rate and rhythm without murmur gallop or rub normal S1 and S2 Abdomen: Positive abdominal pain to palpation  greatest in the LUQ, negative dysphagia, positive distention, firm to palpation,  positive bowel sounds positive, no rebound, no ascites, no appreciable mass Extremities: No significant cyanosis, clubbing, or edema bilateral lower extremities Psychiatric:  Negative depression, negative anxiety, negative fatigue, negative mania Neurologic:  Cranial nerves II through XII intact, tongue/uvula midline, all extremities muscle strength 5/5, sensation intact throughout, negative dysarthria, negative expressive aphasia, negative receptive aphasia.      Data Reviewed: Basic Metabolic Panel:  Recent Labs Lab 01/18/15 1243 01/18/15 2333 01/19/15 0434  NA 128*  --  127*  K 3.9  --  4.4  CL 97*  --  97*  CO2  --   --  21*  GLUCOSE 151*  --  138*  BUN 34*  --  32*  CREATININE 1.10*  --  1.17*  CALCIUM  --   --  7.4*  MG  --  2.4  --   PHOS  --  2.7  --    Liver Function Tests:  Recent Labs Lab 01/18/15 1228 01/19/15 0434  AST 28 23  ALT 14 11*  ALKPHOS 52 38  BILITOT 1.3* 0.6  PROT 7.1 5.9*  ALBUMIN 4.0 3.2*    Recent Labs Lab 01/18/15 1228  LIPASE 20*   No results for input(s): AMMONIA in the last 168 hours. CBC:  Recent Labs Lab 01/18/15 1228 01/18/15 1243 01/19/15 0434 01/20/15 0458  WBC 8.7  --  11.7* 7.5  NEUTROABS 7.8*  --   --   --   HGB 12.1 13.6 10.2* 8.9*  HCT 35.3* 40.0 30.5* 27.0*  MCV 94.6  --  96.2 98.9  PLT 238  --  187 151   Cardiac Enzymes:  Recent Labs Lab 01/18/15 1926 01/18/15 2333 01/19/15 0434  TROPONINI 0.76* 0.60* 0.40*   BNP (last 3 results)  Recent Labs  01/18/15 1228  BNP 1610.0*    ProBNP (last 3 results)  Recent Labs  03/01/14 1930  PROBNP 2785.0*    CBG:  Recent Labs Lab 01/18/15 2033 01/18/15 2352 01/19/15 0450  GLUCAP 127* 126* 131*    Recent Results (from the past 240 hour(s))  MRSA PCR Screening     Status: None   Collection Time: 01/18/15  6:58 PM  Result Value Ref Range Status   MRSA by PCR NEGATIVE NEGATIVE Final    Comment:        The GeneXpert MRSA Assay (FDA approved  for NASAL specimens only), is one component of a comprehensive MRSA colonization surveillance program. It is not intended to diagnose MRSA infection nor to guide or monitor treatment for MRSA infections.      Studies:  Recent x-ray studies have been reviewed in detail by the Attending Physician  Scheduled Meds:  Scheduled Meds: . sodium chloride   Intravenous Once  . aspirin EC  81 mg Oral Daily  . atorvastatin  20 mg Oral q1800  . carvedilol  3.125 mg Oral BID WC  . folic acid  1 mg Intravenous Daily  . sodium chloride  10-40 mL Intracatheter Q12H  . sodium chloride  3 mL Intravenous Q12H  . thiamine  100 mg Intravenous Daily    Time spent on care of this patient: 40 mins   WOODS, Geraldo Docker , MD  [facility] Office  [phone] Pager - [phone]  On-Call/Text Page:      [email or web address]      password [email or web address]  If 7PM-7AM, please contact night-coverage [email or web address] Password [email or web address] 01/20/2015, 5:27 PM   LOS:  2 days   Care during the described time interval was provided by me .  I have reviewed this patient's available data, including medical history, events of note, physical examination, and all test results as part of my evaluation. I have personally reviewed and interpreted all radiology studies.   Dia Crawford, MD [phone] Pager

## 2015-01-21 ENCOUNTER — Encounter (HOSPITAL_COMMUNITY)
Admission: EM | Disposition: A | Discharge status: Skilled Nursing Facility | Payer: Self-pay | Source: Home / Self Care | Attending: Internal Medicine

## 2015-01-21 ENCOUNTER — Encounter (HOSPITAL_COMMUNITY): Payer: Self-pay

## 2015-01-21 HISTORY — PX: FLEXIBLE SIGMOIDOSCOPY: SHX5431

## 2015-01-21 LAB — COMPREHENSIVE METABOLIC PANEL
ALBUMIN: 2.8 g/dL — AB (ref 3.5–5.0)
ALT: 10 U/L — ABNORMAL LOW (ref 14–54)
AST: 15 U/L (ref 15–41)
Alkaline Phosphatase: 39 U/L (ref 38–126)
Anion gap: 4 — ABNORMAL LOW (ref 5–15)
BUN: 9 mg/dL (ref 6–20)
CHLORIDE: 106 mmol/L (ref 101–111)
CO2: 26 mmol/L (ref 22–32)
Calcium: 7 mg/dL — ABNORMAL LOW (ref 8.9–10.3)
Creatinine, Ser: 0.78 mg/dL (ref 0.44–1.00)
GFR calc Af Amer: >60 mL/min (ref >60)
Glucose, Bld: 108 mg/dL — ABNORMAL HIGH (ref 65–99)
Potassium: 4.1 mmol/L (ref 3.5–5.1)
Sodium: 136 mmol/L (ref 135–145)
Total Bilirubin: 0.6 mg/dL (ref 0.3–1.2)
Total Protein: 5.5 g/dL — ABNORMAL LOW (ref 6.5–8.1)

## 2015-01-21 LAB — CBC WITH DIFFERENTIAL/PLATELET
BASOS PCT: 0 % (ref 0–1)
Basophils Absolute: 0 10*3/uL (ref 0.0–0.1)
EOS ABS: 0.3 10*3/uL (ref 0.0–0.7)
Eosinophils Relative: 4 % (ref 0–5)
HEMATOCRIT: 27.1 % — AB (ref 36.0–46.0)
Hemoglobin: 8.8 g/dL — ABNORMAL LOW (ref 12.0–15.0)
LYMPHS ABS: 0.8 10*3/uL (ref 0.7–4.0)
Lymphocytes Relative: 12 % (ref 12–46)
MCH: 32.5 pg (ref 26.0–34.0)
MCHC: 32.5 g/dL (ref 30.0–36.0)
MCV: 100 fL (ref 78.0–100.0)
Monocytes Absolute: 0.8 10*3/uL (ref 0.1–1.0)
Monocytes Relative: 13 % — ABNORMAL HIGH (ref 3–12)
NEUTROS ABS: 4.5 10*3/uL (ref 1.7–7.7)
Neutrophils Relative %: 71 % (ref 43–77)
Platelets: 149 10*3/uL — ABNORMAL LOW (ref 150–400)
RBC: 2.71 MIL/uL — AB (ref 3.87–5.11)
RDW: 13.3 % (ref 11.5–15.5)
WBC: 6.4 10*3/uL (ref 4.0–10.5)

## 2015-01-21 LAB — TROPONIN I
TROPONIN I: 0.05 ng/mL — AB (ref <0.031)
TROPONIN I: 0.06 ng/mL — AB (ref <0.031)

## 2015-01-21 LAB — MAGNESIUM: MAGNESIUM: 2.3 mg/dL (ref 1.7–2.4)

## 2015-01-21 SURGERY — Surgical Case
Anesthesia: *Unknown

## 2015-01-21 SURGERY — SIGMOIDOSCOPY, FLEXIBLE
Anesthesia: Moderate Sedation

## 2015-01-21 MED ORDER — FENTANYL CITRATE (PF) 100 MCG/2ML IJ SOLN
INTRAMUSCULAR | Status: AC
  Filled 2015-01-21: qty 2

## 2015-01-21 MED ORDER — ALBUTEROL SULFATE (2.5 MG/3ML) 0.083% IN NEBU: 2.5000 mg | INHALATION_SOLUTION | RESPIRATORY_TRACT | Status: DC | PRN

## 2015-01-21 MED ORDER — FENTANYL CITRATE (PF) 100 MCG/2ML IJ SOLN
INTRAMUSCULAR | Status: DC | PRN
  Administered 2015-01-21 (×2): 25 ug via INTRAVENOUS

## 2015-01-21 MED ORDER — METOPROLOL TARTRATE 1 MG/ML IV SOLN
5.0000 mg | Freq: Once | INTRAVENOUS | Status: AC
  Administered 2015-01-21: 5 mg via INTRAVENOUS
  Filled 2015-01-21: qty 5

## 2015-01-21 MED ORDER — MIDAZOLAM HCL 10 MG/2ML IJ SOLN
INTRAMUSCULAR | Status: DC | PRN
  Administered 2015-01-21 (×2): 2 mg via INTRAVENOUS

## 2015-01-21 MED ORDER — MIDAZOLAM HCL 5 MG/ML IJ SOLN
INTRAMUSCULAR | Status: AC
  Filled 2015-01-21: qty 2

## 2015-01-21 NOTE — Care Management (Signed)
Important Message  Patient Details  Name: Haley Harris MRN: [id number] Date of Birth: [birth year]   Medicare Important Message Given:  Yes-second notification given    Nathen May 01/21/2015, 3:53 PM

## 2015-01-21 NOTE — Interval H&P Note (Signed)
History and Physical Interval Note:  01/21/2015 5:30 PM  Haley Harris  has presented today for surgery, with the diagnosis of lower GI bleed  The various methods of treatment have been discussed with the patient and family. After consideration of risks, benefits and other options for treatment, the patient has consented to  Procedure(s): FLEXIBLE SIGMOIDOSCOPY (N/A) as a surgical intervention .  The patient's history has been reviewed, patient examined, no change in status, stable for surgery.  I have reviewed the patient's chart and labs.  Questions were answered to the patient's satisfaction.     Shlonda Dolloff R

## 2015-01-21 NOTE — H&P (View-Only) (Signed)
Referring Provider: No ref. provider found Primary Care Physician:  [name], MD Primary Gastroenterologist:  Dr. Richmond Campbell  Reason for Consultation:  Colonic obstruction  HPI: Haley Harris is a 81 y.o. female who developed the acute onset of  generalized abdominal pain and distention 24 hours ago.  Retching without vomiting.  Last bowel movement yesterday, small volume, with tenesmus.  No fever, rigor, diaphoresis, or chill.  No flatus.  Some improvement since ED admit, receiving Dilaudid, NG decompression initiated.  Significant past GI history of diverticular disease, hospitalized with acute diverticulitis, last time ~25 years ago.  Colonoscopies performed by me in 2000 - no polyps, 2005 - rectosigmoid adenoma, 2008 - right colon adenoma.  Surveillance was then abandoned because of multiple co-morbitiies, including intercurrent CAD with stent placement requiring Plavix therapy.  No recent GI symptoms of concern until obstruction developed.   Past Medical History  Diagnosis Date  . Allergic rhinitis, cause unspecified   . Unspecified cardiovascular disease   . Personal history of malignant neoplasm of breast   . Occlusion and stenosis of carotid artery without mention of cerebral infarction   . Chronic airway obstruction, not elsewhere classified   . Depressive disorder, not elsewhere classified   . Other dyspnea and respiratory abnormality   . Esophageal reflux   . Unspecified hearing loss   . Other and unspecified hyperlipidemia   . Unspecified essential hypertension   . Osteoporosis, unspecified   . Peripheral vascular disease, unspecified   . Unspecified urinary incontinence   . Spinal stenosis   . ACE-inhibitor cough   . Angina   . Heart murmur     aS CHILD  . Shortness of breath   . Recurrent upper respiratory infection (URI)   . Anxiety   . Pneumonia   . 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 Myocardial infarct   . Osteoarthrosis, unspecified  whether generalized or localized, unspecified site   . Malignant neoplasm of breast (female), unspecified site   . Compression fracture of T12 vertebra 10/28/2011    Past Surgical History  Procedure Laterality Date  . Mastectomy  1987    Right  . Breast reconstruction  1998    Reconstruction   . Reduction mammaplasty  1998    Left  . Mastoidectomy  childhood  . Appendectomy  1948  . Lumbar laminectomy  2003  . Shoulder surgery  02/2005    Bilateral fractures with multiple surgeries  . Breast implant removal  06/12/09    right  . Abdominal hysterectomy    . Fracture surgery      fracture right elbow  . Coronary angioplasty  11/12    distal RCA  . Mastectomy    . Left heart catheterization with coronary angiogram N/A 06/05/2011    Procedure: LEFT HEART CATHETERIZATION WITH CORONARY ANGIOGRAM;  Surgeon: Clent Demark, MD;  Location: [facility] CATH LAB;  Service: Cardiovascular;  Laterality: N/A;  . Left heart catheterization with coronary angiogram N/A 03/05/2014    Procedure: LEFT HEART CATHETERIZATION WITH CORONARY ANGIOGRAM;  Surgeon: Clent Demark, MD;  Location: [facility] CATH LAB;  Service: Cardiovascular;  Laterality: N/A;    Prior to Admission medications   Medication Sig Start Date End Date Taking? Authorizing Provider  albuterol (PROVENTIL) (2.5 MG/3ML) 0.083% nebulizer solution Take 3 mLs (2.5 mg total) by nebulization every 6 (six) hours as needed for wheezing or shortness of breath. 07/30/14  Yes Venia Carbon, MD  aspirin 81 MG tablet  Take 81 mg by mouth daily after lunch.    Yes Historical Provider, MD  Calcium Carbonate-Vitamin D (CALTRATE 600+D PO) Take 1 tablet by mouth daily after lunch.    Yes Historical Provider, MD  carvedilol (COREG) 12.5 MG tablet Take 1 tablet (12.5 mg total) by mouth 2 (two) times daily with a meal. 07/23/14  Yes Venia Carbon, MD  cetirizine (ZYRTEC) 10 MG tablet Take 10 mg by mouth daily. For allergies   Yes Historical Provider, MD  clopidogrel  (PLAVIX) 75 MG tablet Take 1 tablet (75 mg total) by mouth daily with breakfast. 07/23/14  Yes Venia Carbon, MD  denosumab (PROLIA) 60 MG/ML SOLN injection Inject 60 mg into the skin every 6 (six) months. Administer in upper arm, thigh, or abdomen   Yes Historical Provider, MD  escitalopram (LEXAPRO) 10 MG tablet Take 1 tablet (10 mg total) by mouth daily. 10/02/14  Yes Venia Carbon, MD  famotidine (PEPCID) 20 MG tablet Take 1 tablet (20 mg total) by mouth 2 (two) times daily. 07/23/14  Yes Venia Carbon, MD  fluticasone (FLONASE) 50 MCG/ACT nasal spray Place 2 sprays into both nostrils daily. 07/23/14  Yes Venia Carbon, MD  furosemide (LASIX) 40 MG tablet Take 1 tablet (40 mg total) by mouth daily. 07/23/14  Yes Venia Carbon, MD  gabapentin (NEURONTIN) 100 MG capsule Take 1 capsule (100 mg total) by mouth 2 (two) times daily. 07/23/14  Yes Venia Carbon, MD  losartan (COZAAR) 50 MG tablet TAKE 1 TABLET BY MOUTH DAILY 07/26/14  Yes Venia Carbon, MD  montelukast (SINGULAIR) 10 MG tablet TAKE 1 TABLET BY MOUTH DAILY 07/26/14  Yes Venia Carbon, MD  Multiple Vitamin (MULITIVITAMIN WITH MINERALS) TABS Take 1 tablet by mouth daily after lunch.    Yes Historical Provider, MD  oxyCODONE-acetaminophen (PERCOCET) 5-325 MG per tablet Take 2 tablets by mouth every 4 (four) hours as needed for pain.    Yes Delos Haring, PA-C  spironolactone (ALDACTONE) 25 MG tablet Take 1 tablet (25 mg total) by mouth daily. 07/23/14  Yes Venia Carbon, MD  vitamin B-12 (CYANOCOBALAMIN) 500 MCG tablet Take 1,000 mcg by mouth 3 (three) times a week. Mon, Wed, Fri   Yes Historical Provider, MD  pravastatin (PRAVACHOL) 80 MG tablet Take 1 tablet by mouth  daily 12/14/14   Venia Carbon, MD    Current Facility-Administered Medications  Medication Dose Route Frequency Provider Last Rate Last Dose  . aspirin EC tablet 81 mg  81 mg Oral Daily Charolette Forward, MD      . atorvastatin (LIPITOR) tablet 20 mg  20  mg Oral q1800 Charolette Forward, MD      . carvedilol (COREG) tablet 3.125 mg  3.125 mg Oral BID WC Charolette Forward, MD      . dextrose 5 % and 0.9 % NaCl with KCl 20 mEq/L infusion   Intravenous Continuous Costin Karlyne Greenspan, MD      . heparin ADULT infusion 100 units/mL (25000 units/250 mL)  1,250 Units/hr Intravenous Continuous Rebecka Apley, RPH      . heparin bolus via infusion 4,000 Units  4,000 Units Intravenous Once Rebecka Apley, [facility]      . morphine 2 MG/ML injection 1 mg  1 mg Intravenous Q2H PRN Samella Parr, NP      . nitroGLYCERIN (NITROGLYN) 2 % ointment 0.5 inch  0.5 inch Topical 3 times per day Charolette Forward, MD      .  ondansetron (ZOFRAN) injection 4 mg  4 mg Intravenous Q6H PRN Samella Parr, NP      . phenol (CHLORASEPTIC) mouth spray 1 spray  1 spray Mouth/Throat PRN Costin Karlyne Greenspan, MD       Current Outpatient Prescriptions  Medication Sig Dispense Refill  . albuterol (PROVENTIL) (2.5 MG/3ML) 0.083% nebulizer solution Take 3 mLs (2.5 mg total) by nebulization every 6 (six) hours as needed for wheezing or shortness of breath. 75 mL 3  . aspirin 81 MG tablet Take 81 mg by mouth daily after lunch.     . Calcium Carbonate-Vitamin D (CALTRATE 600+D PO) Take 1 tablet by mouth daily after lunch.     . carvedilol (COREG) 12.5 MG tablet Take 1 tablet (12.5 mg total) by mouth 2 (two) times daily with a meal. 180 tablet 3  . cetirizine (ZYRTEC) 10 MG tablet Take 10 mg by mouth daily. For allergies    . clopidogrel (PLAVIX) 75 MG tablet Take 1 tablet (75 mg total) by mouth daily with breakfast. 90 tablet 3  . denosumab (PROLIA) 60 MG/ML SOLN injection Inject 60 mg into the skin every 6 (six) months. Administer in upper arm, thigh, or abdomen    . escitalopram (LEXAPRO) 10 MG tablet Take 1 tablet (10 mg total) by mouth daily. 90 tablet 3  . famotidine (PEPCID) 20 MG tablet Take 1 tablet (20 mg total) by mouth 2 (two) times daily. 180 tablet 3  . fluticasone (FLONASE) 50 MCG/ACT nasal spray  Place 2 sprays into both nostrils daily. 48 g 3  . furosemide (LASIX) 40 MG tablet Take 1 tablet (40 mg total) by mouth daily. 90 tablet 3  . gabapentin (NEURONTIN) 100 MG capsule Take 1 capsule (100 mg total) by mouth 2 (two) times daily. 180 capsule 3  . losartan (COZAAR) 50 MG tablet TAKE 1 TABLET BY MOUTH DAILY 90 tablet 3  . montelukast (SINGULAIR) 10 MG tablet TAKE 1 TABLET BY MOUTH DAILY 90 tablet 3  . Multiple Vitamin (MULITIVITAMIN WITH MINERALS) TABS Take 1 tablet by mouth daily after lunch.     . oxyCODONE-acetaminophen (PERCOCET) 5-325 MG per tablet Take 2 tablets by mouth every 4 (four) hours as needed for pain.     Marland Kitchen spironolactone (ALDACTONE) 25 MG tablet Take 1 tablet (25 mg total) by mouth daily. 90 tablet 3  . vitamin B-12 (CYANOCOBALAMIN) 500 MCG tablet Take 1,000 mcg by mouth 3 (three) times a week. Mon, Wed, Fri    . pravastatin (PRAVACHOL) 80 MG tablet Take 1 tablet by mouth  daily 90 tablet 3    Allergies as of 01/18/2015 - Review Complete 01/18/2015  Allergen Reaction Noted  . Amoxicillin-pot clavulanate Anaphylaxis 10/02/2010  . Cephalexin Other (See Comments) 01/18/2007  . Clarithromycin Other (See Comments) 01/18/2007  . Lidocaine  01/18/2015  . Nifedipine Other (See Comments)   . Nsaids Other (See Comments) 10/05/2012  . Olmesartan medoxomil    . Penicillins  01/18/2015  . Tramadol hcl Other (See Comments) 01/18/2007    Family History  Problem Relation Age of Onset  . Heart attack Father 53  . Cancer Mother     ovarian  . Cancer Brother     lung  . Arthritis      family    History   Social History  . Marital Status: Widowed    Spouse Name: N/A  . Number of Children: 2  . Years of Education: N/A   Occupational History  . 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     Social History Main Topics  . Smoking status: Former Smoker    Quit date: 07/13/1974  . Smokeless tobacco: Never Used  . Alcohol Use: 0.0 oz/week    0 Standard drinks  or equivalent per week     Comment: occasional  . Drug Use: No  . Sexual Activity: No   Other Topics Concern  . Not on file   Social History Narrative   Has living will   Son or daughter would be health care POA.   Requests DNR--written 03/02/13   No tube feeds if cognitively unaware    Review of Systems: Positive for: Gen:  Denies any fever, chills, rigors, night sweats, anorexia, fatigue, weakness, malaise, involuntary weight loss, and sleep disorder CV:  Denies chest pain, angina, palpitations, syncope, orthopnea, PND, peripheral edema, and claudication. Resp: Denies dyspnea, cough, sputum, wheezing, coughing up blood. GI: Denies dysphagia, odynophagia, abdominal pain, nausea, vomiting, vomiting blood, jaundice, black stools, rectal bleeding, constipation, diarrhea, and fecal incontinence.    GU : Denies urinary burning, blood in urine, urinary frequency, urinary hesitancy, nocturnal urination, and urinary incontinence. MS: Denies joint pain or swelling.  Denies muscle weakness, cramps, atrophy.  Derm: Denies rash, itching, oral ulcerations, hives, unhealing ulcers.  Psych: Denies depression, anxiety, memory loss, suicidal ideation, hallucinations,  and confusion. Heme: Denies bruising, bleeding, and enlarged lymph nodes. Neuro:  Denies any headaches, dizziness, paresthesias. Endo:  Denies any problems with heat or cold intolerance, polydipsia/polyuria, unusual weight gain.  Physical Exam: Vital signs in last 24 hours: Temp:  [99 F (37.2 C)] 99 F (37.2 C) (07/08 1216) Pulse Rate:  [93-105] 97 (07/08 1759) Resp:  [11-18] 14 (07/08 1759) BP: (100-154)/(57-99) 114/68 mmHg (07/08 1759) SpO2:  [92 %-99 %] 99 % (07/08 1759)   General:   Alert,  Well-developed,obese, pleasant and cooperative.  NG tube in place. Head:  Normocephalic and atraumatic. Eyes:  Sclera clear, no icterus.   Conjunctiva pink. Mouth:   No ulcerations or lesions.  Oropharynx pink & moist. Neck:   No masses  or thyromegaly. Lungs:  Clear throughout to auscultation, decreased breath sounds at bases.   No wheezes, crackles, or rhonchi. No evident respiratory distress. Heart:   Regular rate and rhythm; no murmurs, clicks, rubs,  or gallops. Abdomen:  Tight distention, mild diffuse tenderness, markedly tympanitic.  No masses, hepatosplenomegaly or ventral hernias noted. Rare bowel sounds, without bruits, guarding, or rebound.   Rectal:  Not performed   Msk:   Symmetrical without gross deformities. Pulses:  Normal pulses noted. Extremities:   Without clubbing, cyanosis, or edema. Neurologic:  Alert and coherent;  grossly normal neurologically. Skin:  Intact without significant lesions or rashes. Cervical Nodes:  No significant cervical adenopathy. Psych:   Alert and cooperative. Normal mood and affect.  Intake/Output from previous day:   Intake/Output this shift:    Lab Results:  Recent Labs  01/18/15 1228 01/18/15 1243  WBC 8.7  --   HGB 12.1 13.6  HCT 35.3* 40.0  PLT 238  --    BMET  Recent Labs  01/18/15 1243  NA 128*  K 3.9  CL 97*  GLUCOSE 151*  BUN 34*  CREATININE 1.10*   LFT  Recent Labs  01/18/15 1228  PROT 7.1  ALBUMIN 4.0  AST 28  ALT 14  ALKPHOS 52  BILITOT 1.3*  BILIDIR 0.2  IBILI 1.1*   PT/INR  Recent Labs  01/18/15 1228  LABPROT 14.2  INR 1.08    C-Diff   Studies/Results: Dg Chest 2 View  01/18/2015   CLINICAL DATA:  Increasing shortness of breath with exertion  EXAM: CHEST  2 VIEW  COMPARISON:  March 03, 2014  FINDINGS: There is mild scarring in the left base region. There is no edema or consolidation. Heart is upper normal in size with pulmonary vascularity within normal limits. There is atherosclerotic change in the aorta. No adenopathy.  There are wedge compression fractures in the mid and lower thoracic spine with increase in kyphosis. There is evidence of old fractures of each proximal humeral metaphysis, stable. There is calcification  in the left carotid artery.  IMPRESSION: Scarring left base. No edema or consolidation. No change in cardiac silhouette. Old fractures as noted. Increase in thoracic kyphosis. Calcification left carotid artery.   Electronically Signed   By: Lowella Grip III M.D.   On: 01/18/2015 14:01   Ct Abdomen Pelvis W Contrast  01/18/2015   CLINICAL DATA:  Lower abdominal pain, swelling, and cramping since yesterday.  EXAM: CT ABDOMEN AND PELVIS WITH CONTRAST  TECHNIQUE: Multidetector CT imaging of the abdomen and pelvis was performed using the standard protocol following bolus administration of intravenous contrast.  CONTRAST:  15mL OMNIPAQUE IOHEXOL 300 MG/ML  SOLN  COMPARISON:  None.  FINDINGS: Lower Chest: Cardiomegaly and mild dependent bibasilar atelectasis noted.  Hepatobiliary: Probable tiny sub-cm hepatic cysts noted, but no definite liver masses are identified. Gallbladder is unremarkable.  Pancreas: No mass, inflammatory changes, or other significant abnormality identified.  Spleen:  Within normal limits in size and appearance.  Adrenals: 3.1 cm right adrenal mass is seen which has nonspecific features and enhancement characteristics.  Kidneys/Urinary Tract:  No evidence of masses or hydronephrosis.  Stomach/Bowel/Peritoneum: Small hiatal hernia noted. There is diffuse colonic dilatation. Abrupt transition with shoulder ring is seen in the mid sigmoid colon where there is evidence of annular colonic wall thickening measuring approximately 3.9 cm in length. This is highly suspicious for obstructing colon carcinoma. Sigmoid diverticulosis is demonstrated, however there is no evidence of diverticulitis. No evidence of small bowel obstruction.  Vascular/Lymphatic: No pathologically enlarged lymph nodes identified. 8 mm left common iliac lymph node noted on image 49/ series 201, which is not considered pathologically enlarged. No abdominal aortic aneurysm or other significant retroperitoneal abnormality  demonstrated.  Reproductive: Prior hysterectomy noted. Adnexal regions are unremarkable in appearance. Tiny amount of free fluid seen in the dependent pelvis.  Other:  None.  Musculoskeletal: No suspicious bone lesions identified. Lower lumbar spine fusion hardware and old T12 vertebral body compression fracture noted.  IMPRESSION: Distal colonic obstruction in the mid sigmoid colon, with appearance highly suspicious for obstructing colon adenocarcinoma. Sigmoidoscopy or colonoscopy recommended for further evaluation.  Sigmoid diverticulosis. No radiographic evidence of diverticulitis.  No definite evidence of metastatic disease within the abdomen or pelvis.  3.1 cm indeterminate right adrenal mass and probable tiny hepatic cysts. Abdomen MRI without and with contrast recommended for further evaluation.   Electronically Signed   By: Earle Gell M.D.   On: 01/18/2015 15:15   Dg Abd Portable 1v  01/18/2015   CLINICAL DATA:  Nausea and abdominal pain since yesterday.  EXAM: PORTABLE ABDOMEN - 1 VIEW  COMPARISON:  CT abdomen and pelvis from the same day.  FINDINGS: NG tube is in place. Moderate distention of the colon per cysts, similar to the topogram of the CT scan. There is no definite free air. Contrast can be seen  within the distal small bowel. Surgical changes are noted in the lumbar spine. Atherosclerotic changes are present in the aorta.  IMPRESSION: Similar appearance of distal colonic obstruction.  NG tube in place.   Electronically Signed   By: San Morelle M.D.   On: 01/18/2015 16:57    Impression: Sigmoid obstruction, complete, possibly malignant, as suggested by CT.  Increased risk with prior history of colon adenomas, no colonoscopic surveillance x 8 years.  Other considerations include diverticular disease, volvulus, intussusception.   Most immediately she needs colonic decompression, correction of electrolytes, IVF.  If adequately decompressed by Monday, cautious sigmoidoscopy to diagnose  etiology of blockage will be pursued.  This will be preceded by gentle TWE.  Surgical consultation should be obtained to facilitate probable surgical therapy.    Plan: 1.  NG decompression 2.  NPO except ice chips 3.  Analgesics as needed 4.  Sigmoidoscopy planned for Monday, 01/21/2015 5.  Surgery consult 6.  Call if additional help needed over weekend, [phone].   LOS: 0 days   Joellyn Grandt R  01/18/2015, 6:13 PM  Referring Provider: No ref. provider found Primary Care Physician:  [name], MD Primary Gastroenterologist:  Dr. Earlean Shawl  Reason for Consultation:    HPI: Haley Harris is a 81 y.o. female    Past Medical History  Diagnosis Date  . Allergic rhinitis, cause unspecified   . Unspecified cardiovascular disease   . Personal history of malignant neoplasm of breast   . Occlusion and stenosis of carotid artery without mention of cerebral infarction   . Chronic airway obstruction, not elsewhere classified   . Depressive disorder, not elsewhere classified   . Other dyspnea and respiratory abnormality   . Esophageal reflux   . Unspecified hearing loss   . Other and unspecified hyperlipidemia   . Unspecified essential hypertension   . Osteoporosis, unspecified   . Peripheral vascular disease, unspecified   . Unspecified urinary incontinence   . Spinal stenosis   . ACE-inhibitor cough   . Angina   . Heart murmur     aS CHILD  . Shortness of breath   . Recurrent upper respiratory infection (URI)   . Anxiety   . Pneumonia   . 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 Myocardial infarct   . Osteoarthrosis, unspecified whether generalized or localized, unspecified site   . Malignant neoplasm of breast (female), unspecified site   . Compression fracture of T12 vertebra 10/28/2011    Past Surgical History  Procedure Laterality Date  . Mastectomy  1987    Right  . Breast reconstruction  1998    Reconstruction   . Reduction mammaplasty  1998     Left  . Mastoidectomy  childhood  . Appendectomy  1948  . Lumbar laminectomy  2003  . Shoulder surgery  02/2005    Bilateral fractures with multiple surgeries  . Breast implant removal  06/12/09    right  . Abdominal hysterectomy    . Fracture surgery      fracture right elbow  . Coronary angioplasty  11/12    distal RCA  . Mastectomy    . Left heart catheterization with coronary angiogram N/A 06/05/2011    Procedure: LEFT HEART CATHETERIZATION WITH CORONARY ANGIOGRAM;  Surgeon: Clent Demark, MD;  Location: [facility] CATH LAB;  Service: Cardiovascular;  Laterality: N/A;  . Left heart catheterization with coronary angiogram N/A 03/05/2014    Procedure: LEFT HEART CATHETERIZATION WITH CORONARY ANGIOGRAM;  Surgeon:  Clent Demark, MD;  Location: [place] CATH LAB;  Service: Cardiovascular;  Laterality: N/A;    Prior to Admission medications   Medication Sig Start Date End Date Taking? Authorizing Provider  albuterol (PROVENTIL) (2.5 MG/3ML) 0.083% nebulizer solution Take 3 mLs (2.5 mg total) by nebulization every 6 (six) hours as needed for wheezing or shortness of breath. 07/30/14  Yes Venia Carbon, MD  aspirin 81 MG tablet Take 81 mg by mouth daily after lunch.    Yes Historical Provider, MD  Calcium Carbonate-Vitamin D (CALTRATE 600+D PO) Take 1 tablet by mouth daily after lunch.    Yes Historical Provider, MD  carvedilol (COREG) 12.5 MG tablet Take 1 tablet (12.5 mg total) by mouth 2 (two) times daily with a meal. 07/23/14  Yes Venia Carbon, MD  cetirizine (ZYRTEC) 10 MG tablet Take 10 mg by mouth daily. For allergies   Yes Historical Provider, MD  clopidogrel (PLAVIX) 75 MG tablet Take 1 tablet (75 mg total) by mouth daily with breakfast. 07/23/14  Yes Venia Carbon, MD  denosumab (PROLIA) 60 MG/ML SOLN injection Inject 60 mg into the skin every 6 (six) months. Administer in upper arm, thigh, or abdomen   Yes Historical Provider, MD  escitalopram (LEXAPRO) 10 MG tablet Take 1 tablet (10 mg  total) by mouth daily. 10/02/14  Yes Venia Carbon, MD  famotidine (PEPCID) 20 MG tablet Take 1 tablet (20 mg total) by mouth 2 (two) times daily. 07/23/14  Yes Venia Carbon, MD  fluticasone (FLONASE) 50 MCG/ACT nasal spray Place 2 sprays into both nostrils daily. 07/23/14  Yes Venia Carbon, MD  furosemide (LASIX) 40 MG tablet Take 1 tablet (40 mg total) by mouth daily. 07/23/14  Yes Venia Carbon, MD  gabapentin (NEURONTIN) 100 MG capsule Take 1 capsule (100 mg total) by mouth 2 (two) times daily. 07/23/14  Yes Venia Carbon, MD  losartan (COZAAR) 50 MG tablet TAKE 1 TABLET BY MOUTH DAILY 07/26/14  Yes Venia Carbon, MD  montelukast (SINGULAIR) 10 MG tablet TAKE 1 TABLET BY MOUTH DAILY 07/26/14  Yes Venia Carbon, MD  Multiple Vitamin (MULITIVITAMIN WITH MINERALS) TABS Take 1 tablet by mouth daily after lunch.    Yes Historical Provider, MD  oxyCODONE-acetaminophen (PERCOCET) 5-325 MG per tablet Take 2 tablets by mouth every 4 (four) hours as needed for pain.    Yes Delos Haring, PA-C  spironolactone (ALDACTONE) 25 MG tablet Take 1 tablet (25 mg total) by mouth daily. 07/23/14  Yes Venia Carbon, MD  vitamin B-12 (CYANOCOBALAMIN) 500 MCG tablet Take 1,000 mcg by mouth 3 (three) times a week. Mon, Wed, Fri   Yes Historical Provider, MD  pravastatin (PRAVACHOL) 80 MG tablet Take 1 tablet by mouth  daily 12/14/14   Venia Carbon, MD    Current Facility-Administered Medications  Medication Dose Route Frequency Provider Last Rate Last Dose  . aspirin EC tablet 81 mg  81 mg Oral Daily Charolette Forward, MD      . atorvastatin (LIPITOR) tablet 20 mg  20 mg Oral q1800 Charolette Forward, MD      . carvedilol (COREG) tablet 3.125 mg  3.125 mg Oral BID WC Charolette Forward, MD      . dextrose 5 % and 0.9 % NaCl with KCl 20 mEq/L infusion   Intravenous Continuous Costin Karlyne Greenspan, MD      . heparin ADULT infusion 100 units/mL (25000 units/250 mL)  1,250 Units/hr Intravenous Continuous Andrey Cota  [place],  [facility]      . heparin bolus via infusion 4,000 Units  4,000 Units Intravenous Once Rebecka Apley, [facility]      . morphine 2 MG/ML injection 1 mg  1 mg Intravenous Q2H PRN Samella Parr, NP      . nitroGLYCERIN (NITROGLYN) 2 % ointment 0.5 inch  0.5 inch Topical 3 times per day Charolette Forward, MD      . ondansetron (ZOFRAN) injection 4 mg  4 mg Intravenous Q6H PRN Samella Parr, NP      . phenol (CHLORASEPTIC) mouth spray 1 spray  1 spray Mouth/Throat PRN Costin Karlyne Greenspan, MD       Current Outpatient Prescriptions  Medication Sig Dispense Refill  . albuterol (PROVENTIL) (2.5 MG/3ML) 0.083% nebulizer solution Take 3 mLs (2.5 mg total) by nebulization every 6 (six) hours as needed for wheezing or shortness of breath. 75 mL 3  . aspirin 81 MG tablet Take 81 mg by mouth daily after lunch.     . Calcium Carbonate-Vitamin D (CALTRATE 600+D PO) Take 1 tablet by mouth daily after lunch.     . carvedilol (COREG) 12.5 MG tablet Take 1 tablet (12.5 mg total) by mouth 2 (two) times daily with a meal. 180 tablet 3  . cetirizine (ZYRTEC) 10 MG tablet Take 10 mg by mouth daily. For allergies    . clopidogrel (PLAVIX) 75 MG tablet Take 1 tablet (75 mg total) by mouth daily with breakfast. 90 tablet 3  . denosumab (PROLIA) 60 MG/ML SOLN injection Inject 60 mg into the skin every 6 (six) months. Administer in upper arm, thigh, or abdomen    . escitalopram (LEXAPRO) 10 MG tablet Take 1 tablet (10 mg total) by mouth daily. 90 tablet 3  . famotidine (PEPCID) 20 MG tablet Take 1 tablet (20 mg total) by mouth 2 (two) times daily. 180 tablet 3  . fluticasone (FLONASE) 50 MCG/ACT nasal spray Place 2 sprays into both nostrils daily. 48 g 3  . furosemide (LASIX) 40 MG tablet Take 1 tablet (40 mg total) by mouth daily. 90 tablet 3  . gabapentin (NEURONTIN) 100 MG capsule Take 1 capsule (100 mg total) by mouth 2 (two) times daily. 180 capsule 3  . losartan (COZAAR) 50 MG tablet TAKE 1 TABLET BY MOUTH DAILY 90 tablet 3  .  montelukast (SINGULAIR) 10 MG tablet TAKE 1 TABLET BY MOUTH DAILY 90 tablet 3  . Multiple Vitamin (MULITIVITAMIN WITH MINERALS) TABS Take 1 tablet by mouth daily after lunch.     . oxyCODONE-acetaminophen (PERCOCET) 5-325 MG per tablet Take 2 tablets by mouth every 4 (four) hours as needed for pain.     Marland Kitchen spironolactone (ALDACTONE) 25 MG tablet Take 1 tablet (25 mg total) by mouth daily. 90 tablet 3  . vitamin B-12 (CYANOCOBALAMIN) 500 MCG tablet Take 1,000 mcg by mouth 3 (three) times a week. Mon, Wed, Fri    . pravastatin (PRAVACHOL) 80 MG tablet Take 1 tablet by mouth  daily 90 tablet 3    Allergies as of 01/18/2015 - Review Complete 01/18/2015  Allergen Reaction Noted  . Amoxicillin-pot clavulanate Anaphylaxis 10/02/2010  . Cephalexin Other (See Comments) 01/18/2007  . Clarithromycin Other (See Comments) 01/18/2007  . Lidocaine  01/18/2015  . Nifedipine Other (See Comments)   . Nsaids Other (See Comments) 10/05/2012  . Olmesartan medoxomil    . Penicillins  01/18/2015  . Tramadol hcl Other (See Comments) 01/18/2007    Family History  Problem Relation  Age of Onset  . Heart attack Father 73  . Cancer Mother     ovarian  . Cancer Brother     lung  . Arthritis      family    History   Social History  . Marital Status: Widowed    Spouse Name: N/A  . Number of Children: 2  . Years of Education: N/A   Occupational History  . 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     Social History Main Topics  . Smoking status: Former Smoker    Quit date: 07/13/1974  . Smokeless tobacco: Never Used  . Alcohol Use: 0.0 oz/week    0 Standard drinks or equivalent per week     Comment: occasional  . Drug Use: No  . Sexual Activity: No   Other Topics Concern  . Not on file   Social History Narrative   Has living will   Son or daughter would be health care POA.   Requests DNR--written 03/02/13   No tube feeds if cognitively unaware    Review of Systems: Positive  for: Gen:  Denies any fever, chills, rigors, night sweats, anorexia, fatigue, weakness, malaise, involuntary weight loss, and sleep disorder CV:  Denies chest pain, angina, palpitations, syncope, orthopnea, PND, peripheral edema, and claudication. Resp: Denies dyspnea, cough, sputum, wheezing, coughing up blood. GI: Denies dysphagia, odynophagia, abdominal pain, nausea, vomiting, vomiting blood, jaundice, black stools, rectal bleeding, constipation, diarrhea, and fecal incontinence.    GU : Denies urinary burning, blood in urine, urinary frequency, urinary hesitancy, nocturnal urination, and urinary incontinence. MS: Denies joint pain or swelling.  Denies muscle weakness, cramps, atrophy.  Derm: Denies rash, itching, oral ulcerations, hives, unhealing ulcers.  Psych: Denies depression, anxiety, memory loss, suicidal ideation, hallucinations,  and confusion. Heme: Denies bruising, bleeding, and enlarged lymph nodes. Neuro:  Denies any headaches, dizziness, paresthesias. Endo:  Denies any problems with heat or cold intolerance, polydipsia/polyuria, unusual weight gain.  Physical Exam: Vital signs in last 24 hours: Temp:  [99 F (37.2 C)] 99 F (37.2 C) (07/08 1216) Pulse Rate:  [93-105] 97 (07/08 1759) Resp:  [11-18] 14 (07/08 1759) BP: (100-154)/(57-99) 114/68 mmHg (07/08 1759) SpO2:  [92 %-99 %] 99 % (07/08 1759)   General:   Alert,  Well-developed, well-nourished, pleasant and cooperative in NAD Head:  Normocephalic and atraumatic. Eyes:  Sclera clear, no icterus.   Conjunctiva pink.   Mouth:   No ulcerations or lesions.  Oropharynx pink & moist. Neck:   No masses or thyromegaly. Lungs:  Clear throughout to auscultation.   No wheezes, crackles, or rhonchi. No evident respiratory distress. Heart:   Regular rate and rhythm; no murmurs, clicks, rubs,  or gallops. Abdomen:  Soft, nontender, nontympanitic, and nondistended. No masses, hepatosplenomegaly or ventral hernias noted. Normal  bowel sounds, without bruits, guarding, or rebound.   Rectal:     Msk:   Symmetrical without gross deformities. Pulses:  Normal pulses noted. Extremities:   Without clubbing, cyanosis, or edema. Neurologic:  Alert and coherent;  grossly normal neurologically. Skin:  Intact without significant lesions or rashes. Cervical Nodes:  No significant cervical adenopathy. Psych:   Alert and cooperative. Normal mood and affect.  Intake/Output from previous day:   Intake/Output this shift:    Lab Results:  Recent Labs  01/18/15 1228 01/18/15 1243  WBC 8.7  --   HGB 12.1 13.6  HCT 35.3* 40.0  PLT 238  --    BMET  Recent Labs  01/18/15 1243  NA 128*  K 3.9  CL 97*  GLUCOSE 151*  BUN 34*  CREATININE 1.10*   LFT  Recent Labs  01/18/15 1228  PROT 7.1  ALBUMIN 4.0  AST 28  ALT 14  ALKPHOS 52  BILITOT 1.3*  BILIDIR 0.2  IBILI 1.1*   PT/INR  Recent Labs  01/18/15 1228  LABPROT 14.2  INR 1.08    C-Diff   Studies/Results: Dg Chest 2 View  01/18/2015   CLINICAL DATA:  Increasing shortness of breath with exertion  EXAM: CHEST  2 VIEW  COMPARISON:  March 03, 2014  FINDINGS: There is mild scarring in the left base region. There is no edema or consolidation. Heart is upper normal in size with pulmonary vascularity within normal limits. There is atherosclerotic change in the aorta. No adenopathy.  There are wedge compression fractures in the mid and lower thoracic spine with increase in kyphosis. There is evidence of old fractures of each proximal humeral metaphysis, stable. There is calcification in the left carotid artery.  IMPRESSION: Scarring left base. No edema or consolidation. No change in cardiac silhouette. Old fractures as noted. Increase in thoracic kyphosis. Calcification left carotid artery.   Electronically Signed   By: Lowella Grip III M.D.   On: 01/18/2015 14:01   Ct Abdomen Pelvis W Contrast  01/18/2015   CLINICAL DATA:  Lower abdominal pain, swelling,  and cramping since yesterday.  EXAM: CT ABDOMEN AND PELVIS WITH CONTRAST  TECHNIQUE: Multidetector CT imaging of the abdomen and pelvis was performed using the standard protocol following bolus administration of intravenous contrast.  CONTRAST:  46mL OMNIPAQUE IOHEXOL 300 MG/ML  SOLN  COMPARISON:  None.  FINDINGS: Lower Chest: Cardiomegaly and mild dependent bibasilar atelectasis noted.  Hepatobiliary: Probable tiny sub-cm hepatic cysts noted, but no definite liver masses are identified. Gallbladder is unremarkable.  Pancreas: No mass, inflammatory changes, or other significant abnormality identified.  Spleen:  Within normal limits in size and appearance.  Adrenals: 3.1 cm right adrenal mass is seen which has nonspecific features and enhancement characteristics.  Kidneys/Urinary Tract:  No evidence of masses or hydronephrosis.  Stomach/Bowel/Peritoneum: Small hiatal hernia noted. There is diffuse colonic dilatation. Abrupt transition with shoulder ring is seen in the mid sigmoid colon where there is evidence of annular colonic wall thickening measuring approximately 3.9 cm in length. This is highly suspicious for obstructing colon carcinoma. Sigmoid diverticulosis is demonstrated, however there is no evidence of diverticulitis. No evidence of small bowel obstruction.  Vascular/Lymphatic: No pathologically enlarged lymph nodes identified. 8 mm left common iliac lymph node noted on image 49/ series 201, which is not considered pathologically enlarged. No abdominal aortic aneurysm or other significant retroperitoneal abnormality demonstrated.  Reproductive: Prior hysterectomy noted. Adnexal regions are unremarkable in appearance. Tiny amount of free fluid seen in the dependent pelvis.  Other:  None.  Musculoskeletal: No suspicious bone lesions identified. Lower lumbar spine fusion hardware and old T12 vertebral body compression fracture noted.  IMPRESSION: Distal colonic obstruction in the mid sigmoid colon, with  appearance highly suspicious for obstructing colon adenocarcinoma. Sigmoidoscopy or colonoscopy recommended for further evaluation.  Sigmoid diverticulosis. No radiographic evidence of diverticulitis.  No definite evidence of metastatic disease within the abdomen or pelvis.  3.1 cm indeterminate right adrenal mass and probable tiny hepatic cysts. Abdomen MRI without and with contrast recommended for further evaluation.   Electronically Signed   By: Earle Gell M.D.   On: 01/18/2015 15:15   Dg  Abd Portable 1v  01/18/2015   CLINICAL DATA:  Nausea and abdominal pain since yesterday.  EXAM: PORTABLE ABDOMEN - 1 VIEW  COMPARISON:  CT abdomen and pelvis from the same day.  FINDINGS: NG tube is in place. Moderate distention of the colon per cysts, similar to the topogram of the CT scan. There is no definite free air. Contrast can be seen within the distal small bowel. Surgical changes are noted in the lumbar spine. Atherosclerotic changes are present in the aorta.  IMPRESSION: Similar appearance of distal colonic obstruction.  NG tube in place.   Electronically Signed   By: San Morelle M.D.   On: 01/18/2015 16:57    Impression:  Plan:    LOS: 0 days   Anniah Glick R  01/18/2015, 6:13 PM

## 2015-01-21 NOTE — Progress Notes (Signed)
[place] TEAM 1 - Stepdown/ICU TEAM Progress Note  Haley Harris CHE:[id number] DOB: May 05, 1929 DOA: 01/18/2015 PCP: [name], MD  Admit HPI / Brief Narrative: 81 year old F Hx Anxiety, Combined Systolic and Diastolic CHF, HTN, HLD, Carotid Artery Stenosis, CAD with prior NSTEMI, Dyslipidemia, PVD associated neuropathy, COPD, GERD, Osteoporosis,  and Chronic Venous insufficiency who presented to the ER c/o abdominal pain which began abruptly with swelling and discomfort. Her last BM was 2 days prior. She became short of breath after the development of the abdominal distention.   In the ER a CT of the abdomen revealed a distal colonic obstruction in the mid sigmoid colon with appearance highly suspicious for obstructing colon adenocarcinoma. There was significant colonic distention with air in the colon. There was also an incidental finding of 3.1 cm indeterminate right adrenal mass along with tiny hepatic cysts. Labs revealed hyponatremia with a sodium 128 but BUN and creatinine at baseline of 34/1.10.   HPI/Subjective: The patient is resting comfortably in a bedside chair.  She denies current nausea or vomiting.  She reports some mild abdominal discomfort but states that her symptoms have improved significantly since her NG tube was placed.  There is no shortness of breath nor is there chest pain.  Assessment/Plan:  Mid sigmoid colonic obstruction  -CT appears consistent with obstructing mass/neoplasm -NG to low wall suction continues -CEA negative -CT chest negative metastatic disease -Sigmoidoscopy planned for 01/21/2015 evening  -CCS consulted; will await findings of sigmoidoscopy before finalizing plan  Acute blood loss anemia / GI bleed -Transfuse if hemoglobin < 8 - appears to have stabilized approximately 8.8  Acute hyponatremia -Likely due to hypovolemia - corrected - follow  Leg edema, left -LLE Doppler negative for DVT/SVT   Right adrenal mass -MRI abdomen pelvis  nondiagnostic - await completion of colon evaluation  CAD in native artery / elevated troponin -Cardiac enzymes trending down -Cardiology following   Chronic combined systolic and diastolic CHF, NYHA class 2 -most recent 2D echo with EF 40-45% -Strict in and out -Daily weight   CKD , stage III -Current renal function stable at baseline  Dyslipidemia -Nothing by mouth so preadmission statin on hold  COPD -Currently compensated without evidence of wheezing or hypoxemia -Continue albuterol nebs PRN  Neuropathy due to peripheral vascular disease  Code Status: FULL Family Communication: Daughter present at time of exam Disposition Plan: SDU  Consultants: Dr.Mohan [facility] (Cardiology) Dr. Richmond Campbell (GI) Dr.Armando Rosendo Gros (CCS)   Procedure/Significant Events: 7/8 CT abdomen pelvis with contrast Distal colonic obstruction mid sigmoid colon, highly suspicious for obstructing colon adenocarcinoma -Sigmoid diverticulosis -No definite evidence of metastatic disease within the abdomen or pelvis. - 3.1 cm indeterminate right adrenal mass and probable tiny hepatic cysts.  7/9 LLE venous Doppler negative DVT/SVT 7/9 MRI abdomen W/Wo contrast -3.0 cm right adrenal mass nonspecific MR characteristics -atypical adrenal adenoma, vs adrenal metastasis -Consider PET-CT scan for further evaluation, or continued followup by CT in 6 months. 7/9 CT chest WO contrast; negative metastatic disease in the chest. -Severe compression fracture deformities at T6 and T12. Moderate compression fracture deformity at T8. 7/9 echocardiogram EF= 40% to 45% - (grade 1 diastolic dysfunction).  Antibiotics: None  DVT prophylaxis: SCD  Objective: Blood pressure 138/63, pulse 73, temperature 97.9 F (36.6 C), temperature source Oral, resp. rate 15, height 5\' 6"  (1.676 m), weight 81 kg (178 lb 9.2 oz), SpO2 95 %.  Intake/Output Summary (Last 24 hours) at 01/21/15 1030 Last data filed at 01/21/15 [phone]  Gross per 24 hour  Intake   1348 ml  Output    625 ml  Net    723 ml   Exam: General: No acute respiratory distress - alert and conversant Lungs: Clear to auscultation bilaterally without wheezes or crackles Cardiovascular: Regular rate and rhythm without murmur gallop or rub normal S1 and S2 Abdomen: Nontender, soft, bowel sounds hypoactive, no rebound, no ascites, no appreciable mass Extremities: No significant cyanosis, clubbing, or edema bilateral lower extremities  Data Reviewed: Basic Metabolic Panel:  Recent Labs Lab 01/18/15 1243 01/18/15 2333 01/19/15 0434 01/21/15 0548  NA 128*  --  127* 136  K 3.9  --  4.4 4.1  CL 97*  --  97* 106  CO2  --   --  21* 26  GLUCOSE 151*  --  138* 108*  BUN 34*  --  32* 9  CREATININE 1.10*  --  1.17* 0.78  CALCIUM  --   --  7.4* 7.0*  MG  --  2.4  --  2.3  PHOS  --  2.7  --   --    Liver Function Tests:  Recent Labs Lab 01/18/15 1228 01/19/15 0434 01/21/15 0548  AST 28 23 15   ALT 14 11* 10*  ALKPHOS 52 38 39  BILITOT 1.3* 0.6 0.6  PROT 7.1 5.9* 5.5*  ALBUMIN 4.0 3.2* 2.8*    Recent Labs Lab 01/18/15 1228  LIPASE 20*   CBC:  Recent Labs Lab 01/18/15 1228 01/18/15 1243 01/19/15 0434 01/20/15 0458 01/21/15 0548  WBC 8.7  --  11.7* 7.5 6.4  NEUTROABS 7.8*  --   --   --  4.5  HGB 12.1 13.6 10.2* 8.9* 8.8*  HCT 35.3* 40.0 30.5* 27.0* 27.1*  MCV 94.6  --  96.2 98.9 100.0  PLT 238  --  187 151 149*   Cardiac Enzymes:  Recent Labs Lab 01/18/15 2333 01/19/15 0434 01/20/15 0013 01/20/15 2015 01/21/15 0548  TROPONINI 0.60* 0.40* 0.06* 0.07* 0.05*   BNP (last 3 results)  Recent Labs  01/18/15 1228  BNP 1610.0*    ProBNP (last 3 results)  Recent Labs  03/01/14 1930  PROBNP 2785.0*    CBG:  Recent Labs Lab 01/18/15 2033 01/18/15 2352 01/19/15 0450  GLUCAP 127* 126* 131*    Recent Results (from the past 240 hour(s))  MRSA PCR Screening     Status: None   Collection Time: 01/18/15  6:58  PM  Result Value Ref Range Status   MRSA by PCR NEGATIVE NEGATIVE Final    Comment:        The GeneXpert MRSA Assay (FDA approved for NASAL specimens only), is one component of a comprehensive MRSA colonization surveillance program. It is not intended to diagnose MRSA infection nor to guide or monitor treatment for MRSA infections.      Studies:  Recent x-ray studies have been reviewed in detail by the Attending Physician  Scheduled Meds:  Scheduled Meds: . sodium chloride   Intravenous Once  . aspirin EC  81 mg Oral Daily  . atorvastatin  20 mg Oral q1800  . carvedilol  3.125 mg Oral BID WC  . folic acid  1 mg Intravenous Daily  . sodium chloride  10-40 mL Intracatheter Q12H  . sodium chloride  3 mL Intravenous Q12H  . thiamine  100 mg Intravenous Daily    Time spent on care of this patient: 35 mins  Cherene Altes, MD [facility] For Consults/Admissions - Flow Manager -  [phone] Office  [phone]  Contact MD directly via text page:      [email or web address]      password [facility]  01/21/2015, 10:30 AM   LOS: 3 days

## 2015-01-21 NOTE — Progress Notes (Signed)
Subjective:  Denies any chest pain or shortness of breath.  Denies abdominal pain.  States she had BM yesterday.  Scheduled for colonoscopy today  Objective:  Vital Signs in the last 24 hours: Temp:  [97.5 F (36.4 C)-99 F (37.2 C)] 97.9 F (36.6 C) (07/11 0751) Pulse Rate:  [62-84] 73 (07/11 0751) Resp:  [12-21] 15 (07/11 0751) BP: (111-153)/(56-110) 138/63 mmHg (07/11 0751) SpO2:  [71 %-100 %] 95 % (07/11 0751) Weight:  [81 kg (178 lb 9.2 oz)] 81 kg (178 lb 9.2 oz) (07/11 0610)  Intake/Output from previous day: 07/10 0701 - 07/11 0700 In: 1345 [I.V.:1345] Out: 625 [Urine:300; Emesis/NG output:325] Intake/Output from this shift: Total I/O In: 3 [I.V.:3] Out: -   Physical Exam: Neck: no adenopathy, no carotid bruit, no JVD and supple, symmetrical, trachea midline Lungs: clear to auscultation bilaterally Heart: regular rate and rhythm, S1, S2 normal and soft systolic murmur noted Abdomen: soft, non-tender; bowel sounds normal; no masses,  no organomegaly Extremities: extremities normal, atraumatic, no cyanosis or edema  Lab Results:  Recent Labs  01/20/15 0458 01/21/15 0548  WBC 7.5 6.4  HGB 8.9* 8.8*  PLT 151 149*    Recent Labs  01/19/15 0434 01/21/15 0548  NA 127* 136  K 4.4 4.1  CL 97* 106  CO2 21* 26  GLUCOSE 138* 108*  BUN 32* 9  CREATININE 1.17* 0.78    Recent Labs  01/20/15 2015 01/21/15 0548  TROPONINI 0.07* 0.05*   Hepatic Function Panel  Recent Labs  01/18/15 1228  01/21/15 0548  PROT 7.1  < > 5.5*  ALBUMIN 4.0  < > 2.8*  AST 28  < > 15  ALT 14  < > 10*  ALKPHOS 52  < > 39  BILITOT 1.3*  < > 0.6  BILIDIR 0.2  --   --   IBILI 1.1*  --   --   < > = values in this interval not displayed. No results for input(s): CHOL in the last 72 hours. No results for input(s): PROTIME in the last 72 hours.  Imaging: Imaging results have been reviewed and Ct Chest Wo Contrast  01/19/2015   CLINICAL DATA:  Sigmoid colon mass, possible  adenocarcinoma, evaluate for metastasis  EXAM: CT CHEST WITHOUT CONTRAST  TECHNIQUE: Multidetector CT imaging of the chest was performed following the standard protocol without IV contrast.  COMPARISON:  Chest radiographs dated 01/18/2015  FINDINGS: Mediastinum/Nodes: The heart is top-normal in size. Trace pleural fluid.  Coronary atherosclerosis.  Atherosclerotic calcifications of the aortic arch.  No suspicious mediastinal or axillary lymphadenopathy.  Visualized thyroid is unremarkable.  Lungs/Pleura: Negative mild patchy bibasilar opacities, likely atelectasis. No focal consolidation.  Trace bilateral pleural effusions.  No suspicious pulmonary nodules.  No pneumothorax.  Upper abdomen: Enteric tube coursing into the proximal stomach. Visualized upper abdomen is otherwise unchanged from recent CT, noting a 3.0 cm right adrenal nodule/mass.  Musculoskeletal: Severe compression fracture deformities at T6 and T12. Moderate compression fracture deformity at T8.  IMPRESSION: No evidence of metastatic disease in the chest.  Trace bilateral pleural effusions.  Severe compression fracture deformities at T6 and T12. Moderate compression fracture deformity at T8.   Electronically Signed   By: Julian Hy M.D.   On: 01/19/2015 15:19   Mr Abdomen W Wo Contrast  01/20/2015   CLINICAL DATA:  Indeterminate right adrenal mass seen on recent CT. Distal colonic obstruction due to colon carcinoma.  EXAM: MRI ABDOMEN WITHOUT AND WITH CONTRAST  TECHNIQUE:  Multiplanar multisequence MR imaging of the abdomen was performed both before and after the administration of intravenous contrast.  CONTRAST:  70mL MULTIHANCE GADOBENATE DIMEGLUMINE 529 MG/ML IV SOLN  COMPARISON:  AP CT on 01/18/2015  FINDINGS: Lower chest: No acute findings.  Hepatobiliary: A few tiny cysts scattered sub-cm hepatic cysts are noted, however no liver masses are identified.  Pancreas: No masses, inflammatory changes, or fluid collections demonstrated.   Spleen:  Within normal limits in size and appearance.  Adrenal Glands: 3.0 cm right adrenal mass is seen which shows peripheral enhancement and central cystic foci. No macroscopic fat is seen. No evidence of signal dropout on chemical shift imaging. These features are nonspecific. Left adrenal gland is normal in appearance.  Kidneys: No renal masses identified. No evidence of hydronephrosis.  Stomach/Bowel/Peritoneum: Mild dilatation of colonic bowel loops within abdomen again noted.  Vascular/Lymphatic: No pathologically enlarged lymph nodes identified. No abdominal aortic aneurysm or other significant retroperitoneal abnormality demonstrated.  Other:  None.  Musculoskeletal:  No suspicious bone lesions identified.  IMPRESSION: 3.0 cm right adrenal mass has nonspecific MR characteristics. Differential diagnosis still includes atypical adrenal adenoma, with adrenal metastasis considered less likely. Consider PET-CT scan for further evaluation, or continued followup by CT in 6 months.   Electronically Signed   By: Earle Gell M.D.   On: 01/20/2015 09:28    Cardiac Studies:  Assessment/Plan:  Probable very small non-Q-wave myocardial infarction  Possible questionable colonic adenocarcinoma with colonic obstruction/stricture Coronary artery disease, history of non-Q-wave MI 2 in the past, status post PCI to distal RCA and LAD in the past. Ischemic cardiomyopathy. Hypertension. Hypercholesterolemia. COPD History of cerebrovascular disease. History of CVA of breast. Spinal stenosis. Vitamin D deficiency. Osteoporosis. History of compression fracture of thoracic spine with kyphosis Plan Continue present management. Awaiting colonoscopy results  LOS: 3 days    Charolette Forward 01/21/2015, 9:34 AM

## 2015-01-21 NOTE — Progress Notes (Signed)
Subjective: Denies abdominal pain Had BM  Objective: Vital signs in last 24 hours: Temp:  [97.5 F (36.4 C)-99 F (37.2 C)] 97.9 F (36.6 C) (07/11 0751) Pulse Rate:  [62-84] 73 (07/11 0751) Resp:  [12-21] 15 (07/11 0751) BP: (111-153)/(56-110) 138/63 mmHg (07/11 0751) SpO2:  [71 %-100 %] 95 % (07/11 0751) Weight:  [81 kg (178 lb 9.2 oz)] 81 kg (178 lb 9.2 oz) (07/11 0610) Last BM Date: 01/20/15  Intake/Output from previous day: 07/10 0701 - 07/11 0700 In: 1345 [I.V.:1345] Out: 625 [Urine:300; Emesis/NG output:325] Intake/Output this shift:   Abdomen distended, non-tender  Lab Results:   Recent Labs  01/20/15 0458 01/21/15 0548  WBC 7.5 6.4  HGB 8.9* 8.8*  HCT 27.0* 27.1*  PLT 151 149*   BMET  Recent Labs  01/19/15 0434 01/21/15 0548  NA 127* 136  K 4.4 4.1  CL 97* 106  CO2 21* 26  GLUCOSE 138* 108*  BUN 32* 9  CREATININE 1.17* 0.78  CALCIUM 7.4* 7.0*   PT/INR  Recent Labs  01/18/15 1228 01/20/15 1524  LABPROT 14.2 14.9  INR 1.08 1.15   ABG No results for input(s): PHART, HCO3 in the last 72 hours.  Invalid input(s): PCO2, PO2  Studies/Results: Ct Chest Wo Contrast  01/19/2015   CLINICAL DATA:  Sigmoid colon mass, possible adenocarcinoma, evaluate for metastasis  EXAM: CT CHEST WITHOUT CONTRAST  TECHNIQUE: Multidetector CT imaging of the chest was performed following the standard protocol without IV contrast.  COMPARISON:  Chest radiographs dated 01/18/2015  FINDINGS: Mediastinum/Nodes: The heart is top-normal in size. Trace pleural fluid.  Coronary atherosclerosis.  Atherosclerotic calcifications of the aortic arch.  No suspicious mediastinal or axillary lymphadenopathy.  Visualized thyroid is unremarkable.  Lungs/Pleura: Negative mild patchy bibasilar opacities, likely atelectasis. No focal consolidation.  Trace bilateral pleural effusions.  No suspicious pulmonary nodules.  No pneumothorax.  Upper abdomen: Enteric tube coursing into the  proximal stomach. Visualized upper abdomen is otherwise unchanged from recent CT, noting a 3.0 cm right adrenal nodule/mass.  Musculoskeletal: Severe compression fracture deformities at T6 and T12. Moderate compression fracture deformity at T8.  IMPRESSION: No evidence of metastatic disease in the chest.  Trace bilateral pleural effusions.  Severe compression fracture deformities at T6 and T12. Moderate compression fracture deformity at T8.   Electronically Signed   By: Julian Hy M.D.   On: 01/19/2015 15:19   Mr Abdomen W Wo Contrast  01/20/2015   CLINICAL DATA:  Indeterminate right adrenal mass seen on recent CT. Distal colonic obstruction due to colon carcinoma.  EXAM: MRI ABDOMEN WITHOUT AND WITH CONTRAST  TECHNIQUE: Multiplanar multisequence MR imaging of the abdomen was performed both before and after the administration of intravenous contrast.  CONTRAST:  29mL MULTIHANCE GADOBENATE DIMEGLUMINE 529 MG/ML IV SOLN  COMPARISON:  AP CT on 01/18/2015  FINDINGS: Lower chest: No acute findings.  Hepatobiliary: A few tiny cysts scattered sub-cm hepatic cysts are noted, however no liver masses are identified.  Pancreas: No masses, inflammatory changes, or fluid collections demonstrated.  Spleen:  Within normal limits in size and appearance.  Adrenal Glands: 3.0 cm right adrenal mass is seen which shows peripheral enhancement and central cystic foci. No macroscopic fat is seen. No evidence of signal dropout on chemical shift imaging. These features are nonspecific. Left adrenal gland is normal in appearance.  Kidneys: No renal masses identified. No evidence of hydronephrosis.  Stomach/Bowel/Peritoneum: Mild dilatation of colonic bowel loops within abdomen again noted.  Vascular/Lymphatic: No pathologically  enlarged lymph nodes identified. No abdominal aortic aneurysm or other significant retroperitoneal abnormality demonstrated.  Other:  None.  Musculoskeletal:  No suspicious bone lesions identified.   IMPRESSION: 3.0 cm right adrenal mass has nonspecific MR characteristics. Differential diagnosis still includes atypical adrenal adenoma, with adrenal metastasis considered less likely. Consider PET-CT scan for further evaluation, or continued followup by CT in 6 months.   Electronically Signed   By: Earle Gell M.D.   On: 01/20/2015 09:28    Anti-infectives: Anti-infectives    None      Assessment/Plan:  Colonic stricture  For endo today to evaluate stricture.  Only operative option will be resection and ostomy vs ostomy only as she is high risk for surgery.  Don't know if GI would be able to dilate or stent stricture if it is not malignant.  LOS: 3 days    Haley Harris A 01/21/2015

## 2015-01-22 ENCOUNTER — Inpatient Hospital Stay (HOSPITAL_COMMUNITY): Payer: Medicare Other

## 2015-01-22 ENCOUNTER — Encounter (HOSPITAL_COMMUNITY): Payer: Self-pay | Admitting: Gastroenterology

## 2015-01-22 DIAGNOSIS — D499 Neoplasm of unspecified behavior of unspecified site: Secondary | ICD-10-CM | POA: Diagnosis present

## 2015-01-22 DIAGNOSIS — J810 Acute pulmonary edema: Secondary | ICD-10-CM | POA: Diagnosis present

## 2015-01-22 HISTORY — PX: FLEXIBLE SIGMOIDOSCOPY: SHX1649

## 2015-01-22 LAB — BLOOD GAS, ARTERIAL
Acid-base deficit: 5.1 mmol/L — ABNORMAL HIGH (ref 0.0–2.0)
Bicarbonate: 20.9 mEq/L (ref 20.0–24.0)
Delivery systems: POSITIVE
Drawn by: 36277
Expiratory PAP: 8
FIO2: 0.6 %
Inspiratory PAP: 18
O2 Saturation: 99.1 %
Patient temperature: 98
RATE: 10 resp/min
TCO2: 22.3 mmol/L (ref 0–100)
pCO2 arterial: 47.7 mmHg — ABNORMAL HIGH (ref 35.0–45.0)
pH, Arterial: 7.261 — ABNORMAL LOW (ref 7.350–7.450)
pO2, Arterial: 203 mmHg — ABNORMAL HIGH (ref 80.0–100.0)

## 2015-01-22 LAB — COMPREHENSIVE METABOLIC PANEL
ALT: 12 U/L — ABNORMAL LOW (ref 14–54)
AST: 16 U/L (ref 15–41)
Albumin: 3.1 g/dL — ABNORMAL LOW (ref 3.5–5.0)
Alkaline Phosphatase: 42 U/L (ref 38–126)
Anion gap: 7 (ref 5–15)
BUN: 7 mg/dL (ref 6–20)
CALCIUM: 7.1 mg/dL — AB (ref 8.9–10.3)
CO2: 23 mmol/L (ref 22–32)
Chloride: 108 mmol/L (ref 101–111)
Creatinine, Ser: 0.78 mg/dL (ref 0.44–1.00)
GFR calc Af Amer: >60 mL/min (ref >60)
GFR calc non Af Amer: >60 mL/min (ref >60)
GLUCOSE: 133 mg/dL — AB (ref 65–99)
Potassium: 3.9 mmol/L (ref 3.5–5.1)
Sodium: 138 mmol/L (ref 135–145)
Total Bilirubin: 0.6 mg/dL (ref 0.3–1.2)
Total Protein: 5.8 g/dL — ABNORMAL LOW (ref 6.5–8.1)

## 2015-01-22 LAB — BRAIN NATRIURETIC PEPTIDE: B Natriuretic Peptide: 1857.1 pg/mL — ABNORMAL HIGH (ref 0.0–100.0)

## 2015-01-22 MED ORDER — FUROSEMIDE 10 MG/ML IJ SOLN
40.0000 mg | Freq: Every day | INTRAMUSCULAR | Status: AC
  Administered 2015-01-22 – 2015-01-26 (×5): 40 mg via INTRAVENOUS
  Filled 2015-01-22 (×5): qty 4

## 2015-01-22 MED ORDER — IOHEXOL 300 MG/ML  SOLN
100.0000 mL | Freq: Once | INTRAMUSCULAR | Status: AC | PRN
  Administered 2015-01-22: 100 mL via INTRAVENOUS

## 2015-01-22 MED ORDER — IOHEXOL 300 MG/ML  SOLN
25.0000 mL | INTRAMUSCULAR | Status: AC
  Administered 2015-01-22 (×2): 25 mL via ORAL

## 2015-01-22 MED ORDER — FUROSEMIDE 10 MG/ML IJ SOLN
INTRAMUSCULAR | Status: AC
  Filled 2015-01-22: qty 4

## 2015-01-22 MED ORDER — FUROSEMIDE 10 MG/ML IJ SOLN
40.0000 mg | Freq: Once | INTRAMUSCULAR | Status: AC
  Administered 2015-01-22: 40 mg via INTRAVENOUS

## 2015-01-22 NOTE — Progress Notes (Signed)
Pt called that she couldn't catch her breath, was anxious and tachycardic. Patient was placed on a non-rebreather and 1mg  of morphine was  given. Oxygen saturation was 100% initially but quickly  dropped to low 70s. Patient was anxious and diaphoretic, with heart rate of 150s. Patient was placed on bipap with the assistance of  respiratory and rapid response.Belenda Cruise [facility]orr of [facility] is on her way to evaluate patient

## 2015-01-22 NOTE — Progress Notes (Signed)
Subjective:  Events of last night noted patient went into acute pulmonary edema requiring BiPAP responding to IV Lasix. Patient denies any chest pain states breathing has improved and no back on nasal cannula. Results of sigmoidoscopy not available states she will be rescheduled for a CT of the abdomen  Objective:  Vital Signs in the last 24 hours: Temp:  [97.7 F (36.5 C)-99.9 F (37.7 C)] 98.9 F (37.2 C) (07/12 0820) Pulse Rate:  [30-149] 84 (07/12 0827) Resp:  [12-38] 17 (07/12 0827) BP: (133-186)/(68-126) 154/68 mmHg (07/12 0826) SpO2:  [72 %-100 %] 98 % (07/12 0827) FiO2 (%):  [40 %-100 %] 40 % (07/12 0820)  Intake/Output from previous day: 07/11 0701 - 07/12 0700 In: 1528 [I.V.:1528] Out: 1625 [Urine:1555; Emesis/NG output:70] Intake/Output from this shift:    Physical Exam: Neck: no adenopathy, no carotid bruit and supple, symmetrical, trachea midline Lungs: Bibasilar rhonchi and rales noted Heart: regular rate and rhythm, S1, S2 normal and Soft systolic murmur and S3 gallop noted Abdomen: Distended bowel sounds present mild generalized tenderness Extremities: No clubbing cyanosis 1+ left leg edema noted  Lab Results:  Recent Labs  01/20/15 0458 01/21/15 0548  WBC 7.5 6.4  HGB 8.9* 8.8*  PLT 151 149*    Recent Labs  01/21/15 0548 01/22/15 0406  NA 136 138  K 4.1 3.9  CL 106 108  CO2 26 23  GLUCOSE 108* 133*  BUN 9 7  CREATININE 0.78 0.78    Recent Labs  01/20/15 2015 01/21/15 0548  TROPONINI 0.07* 0.05*   Hepatic Function Panel  Recent Labs  01/22/15 0406  PROT 5.8*  ALBUMIN 3.1*  AST 16  ALT 12*  ALKPHOS 42  BILITOT 0.6   No results for input(s): CHOL in the last 72 hours. No results for input(s): PROTIME in the last 72 hours.  Imaging: Imaging results have been reviewed and Dg Chest Port 1 View  01/22/2015   CLINICAL DATA:  Shortness of Breath  EXAM: PORTABLE CHEST - 1 VIEW  COMPARISON:  01/18/2015  FINDINGS: Interval placement of  an enteric tube. Tip is off the field of view but below the left hemidiaphragm. Interval placement of a right PICC catheter with tip projected over the cavoatrial junction. No pneumothorax. Tubing projected over the right chest may be external or could indicate a chest tube. Mild cardiac enlargement without significant vascular congestion. Patchy infiltration in the lungs, increasing since prior study. This could indicate pneumonia or edema. No blunting of costophrenic angles. No pneumothorax. Calcified and tortuous aorta. Degenerative changes in the shoulders.  IMPRESSION: Appliances appear to be in satisfactory location. Cardiac enlargement. Infiltration in both lungs may indicate pneumonia or edema. No pneumothorax.   Electronically Signed   By: Lucienne Capers M.D.   On: 01/22/2015 03:44    Cardiac Studies:  Assessment/Plan:  Acute pulmonary edema secondary to overload rule out ischemia Probable very small non-Q-wave myocardial infarction  Possible questionable colonic adenocarcinoma with colonic obstruction/stricture Coronary artery disease, history of non-Q-wave MI 2 in the past, status post PCI to distal RCA and LAD in the past. Ischemic cardiomyopathy. Hypertension. Hypercholesterolemia. COPD History of cerebrovascular disease. History of CVA of breast. Spinal stenosis. Vitamin D deficiency. Osteoporosis. History of compression fracture of thoracic spine with kyphosis Anemia questionable etiology Plan Agree with  IV Lasix and restricting fluids. Awaiting GI evaluation Check EKG and troponin I Will discuss regarding cardiac catheterization once GI status is cleared.  LOS: 4 days    Charolette Forward 01/22/2015,  8:44 AM

## 2015-01-22 NOTE — Progress Notes (Signed)
Was walking by patient's room and heard her call for help saying that she could not breath.RN was in room with patient.  Patient was on 2L  and her sat was 71%.  Placed patient on NRB and her sats did not increase. Patient was struggling to breath using accessory muscles, unable to complete sentences and had wet breath sounds.  Rapid response called. Placed patient on BIPAP 18/8, rate 10, FIO2 100%.  Decreased to 60% and ABG was obtained.

## 2015-01-22 NOTE — Progress Notes (Signed)
Shift event note: Notified by RN that pt awoke c/o sudden onset of SOB. Pt's HR in the 130-140's and BP- 174/108. On initial assessment RN reports BBS w/ rales, 02 sats in the 70's. RR RN was paged and responded to bedside. Unable to get 02 sats up with NRB so RR RN placed pt on BiPAP w/ assistance of RRT. Sats immediately increased to mid 90's and pt reported feeling much better on BiPAP. ABG and PCXR were ordered by RR RN. At bedside pt noted on BiPAP. She is awake, alert and oriented. Pt admits that this is quite typical of her symptoms associated w/ CHF. She notes that she normally takes lasix but has not been on it in since admission. She also notes that her lungs have been "clear" despite not taking her lasix. She denies CP or other c/o's. BBS w/ diffuse crackles. HR has trended down to 90's-105 but she remains hypertensive. ABG reveals pH-7.26, pC02-47.7, p02-203 and bicarb-20.9. CXR findings c/w pulmonary edema (or pneumonia). BNP 1610.0 on admission.  Assessment/Plan: 1. Acute respiratory distress: Given clinical picture (no CP, no cough, fever or leukocytosis) will give lasix 40 mg IV and attempt to wean off BiPAP. IVF's decreased to [facility] for now.  2. Hypertension: Has been hypertensive at baseline. Worse w/ resp distress. Will reassess after IV lasix. May give AM BP meds early if indicated. Will continue to monitor closely on SDU. Will defer further changes in plan to rounding MD.   Jeryl Columbia, NP-C [facility] Pager [phone]

## 2015-01-22 NOTE — Progress Notes (Signed)
[place] TEAM 1 - Stepdown/ICU TEAM Progress Note  Haley Harris IFO:[id number] DOB: Nov 03, 1928 DOA: 01/18/2015 PCP: [name], MD  Admit HPI / Brief Narrative: 81 year old WF PMHx Anxiety, Combined Systolic and Diastolic CHF, HTN, HLD, Carotid Artery Stenosis, CAD with prior NSTEMI, Dyslipidemia, PVD associated neuropathy, COPD, GERD, Osteoporosis and Chronic Venous insufficiency.   Patient presented to the ER today because of shortness of breath and abdominal pain. Patient reports the abdominal symptoms began abruptly last night with swelling and discomfort with abdominal pain radiating up into the chest. She describes a bandlike tight pain in the upper abdomen as well as suprapubic discomfort. Her last BM was 2 days ago. She really has not been passing any flatus and has had excessive belching and reflux since onset of symptoms. She became short of breath after the development of the abdominal distention. She reports left lower extremity swelling for about one month. She has been unable to wear her support hose due to the swelling causing discomfort. After onset of abdominal distention she did have dry heaving and has noticed poor oral intake. She has not had any chest pain, palpitations or shortness of breath symptoms prior to onset of abdominal distention. She denies dysuria, hematuria, previous blood or dark stools.  In the ER patient had a low-grade temperature of 99, she was slightly tachycardic with heart rate of 105, respirations were 17, she was mildly hypertensive initially with a blood pressure 154/99 but after she was given IV morphine for pain and Zofran for nausea her tachycardia decreased with a heart rate now 95 and blood pressure down to 114/60. She's had room air saturations between 92 and 98%. A CT of the abdomen revealed a distal colonic obstruction in the mid sigmoid colon with appearance that is highly suspicious for obstructing colon adenocarcinoma. There was significant  colonic distention with air in the colon. There was also an incidental finding of 3.1 cm indeterminate right adrenal mass along with tiny hepatic cysts. Radiologist recommended abdominal MRI with and without contrast for further evaluation. Because of her shortness of breath and history of heart failure/COPD and chest x-ray was ordered but this did not demonstrate any evidence of edema or consolidation. Laboratory data revealed a new hyponatremia with a sodium 128 but BUN and creatinine at baseline of 34/1.10. BNP was slightly elevated at 1600 and troponin was mildly elevated at 0.61, lipase was normal at 20. LFTs were normal except for mild elevation in total bilirubin at 1.3 with an indirect bilirubin of 1.1 direct bilirubin 0.2. CBC was normal except for elevated neutrophils 90%. Urinalysis was abnormal with moderate bilirubin Amber color and 40 ketones but no evidence of UTI. Because of the obstructing mass seen on CT, EDP/Dr. Roderic Palau has called the patient's primary gastroenterologist Dr. Earlean Shawl will come see the patient during the hospitalization. Because of the abnormal troponin the EDP also notified the patient's cardiologist Dr. Terrence Dupont of need for consultation.  HPI/Subjective: 7/12  A/O 4, negative CP P, negative SOB (improved from last night), negative abdominal pain.    Assessment/Plan: Colonic obstruction mid sigmoid -CT appears consistent with obstructing mass; c/w neoplasm. -Continue NG tube to low wall suction (dark frank blood appears to have stopped; more bilious)  -NPO --CEA; negative -CT chest without contrast; negative metastatic disease see results below   -Sigmoidoscopy Monday, 01/21/2015; spoke with Dr. Richmond Campbell (GI), who stated significant stool in the colon; was able to advance scope to 60 cm without seeing a mass; did see significant  diverticulosis. Requested repeat CT abdomen/pelvis PO contrast.  -7/12 Dr.Douglas A. Ninfa Linden  ([facility]); awaiting final report on endoscopy  prior to making decision on surgery. -7/12 CT abdomen/pelvis with PO contrast pending  Acute blood loss anemia/GI bleed -Patient NG tube continues suction bilious fluid -Patient hemoglobin stable . -Transfuse if hemoglobin<8 -GI bleed continue hold anticoagulate  Acute hyponatremia -Resolved   Leg edema, left -LLE Doppler negative for DVT/SVT   Right adrenal mass -Patient has underlying chronic kidney disease; monitor function closely -MRI abdomen pelvis nondiagnostic see results below  CAD in native artery/elevated troponin -Cardiac enzymes trending down, continue to monitor  -Current EKG nonischemic  Chronic combined systolic and diastolic CHF, NYHA class 2 -Based on chest x-ray and current clinical exam patient is compensated; most recent 2D echo with EF 40-45% - Monitor closely for fluid overload  -Strict in and out since admission -45.6L -Daily weight standing  Acute pulmonary edema -Overnight patient experienced acute respiratory decompensation; placed on BiPAP, fluids DC'd, Lasix started. -Received Lasix overnight, patient now euvolemic.   CKD , stage III -Current renal function stable and at baseline  Dyslipidemia -Nothing by mouth so preadmission statin on hold  COPD -Currently compensated without evidence of wheezing or hypoxemia -Continue albuterol nebs PRN  Neuropathy due to peripheral vascular disease -Insetting of nothing by mouth preadmission Neurontin on hold    Code Status: FULL Family Communication: Daughter present at time of exam Disposition Plan: Per surgery    Consultants: Dr.M[facility] (cardiology) Dr. Richmond Campbell (GI) Dr.Armando Rosendo Gros ([facility])     Procedure/Significant Events: 7/8 CT abdomen pelvis with contrast;-Distal colonic obstruction mid sigmoid colon, highly suspicious for obstructing colon adenocarcinoma. -Sigmoid diverticulosis. -No definite evidence of metastatic disease within the abdomen or pelvis. -3.1 cm  indeterminate right adrenal mass and probable tiny hepatic cysts.  7/9 LLE venous Doppler; negative DVT/SVT 7/9 MRI abdomen W/Wo contrast; -3.0 cm right adrenal mass nonspecific MR characteristics. -atypical adrenal adenoma, vs adrenal metastasis.  -Consider PET-CT scan for further evaluation, or continued followup by CT in 6 months. 7/9 CT chest WO contrast; negative metastatic disease in the chest. -Severe compression fracture deformities at T6 and T12. Moderate compression fracture deformity at T8. 7/9 echocardiogram;- Left ventricle: mild concentric hypertrophy. LVEF= 40% to 45%.  - (grade 1 diastolic dysfunction).   Culture   Antibiotics:   DVT prophylaxis: SCD   Devices    LINES / TUBES:      Continuous Infusions:    Objective: VITAL SIGNS: Temp: 98.2 F (36.8 C) (07/12 2015) Temp Source: Oral (07/12 2015) BP: 142/77 mmHg (07/12 2015) Pulse Rate: 78 (07/12 2015) SPO2; FIO2:   Intake/Output Summary (Last 24 hours) at 01/22/15 2134 Last data filed at 01/22/15 1900  Gross per 24 hour  Intake    980 ml  Output   3980 ml  Net  -3000 ml     Exam: General: A/O 4, NAD, No acute respiratory distress Eyes: Negative headache, negative eye pain, double vision, negative retinal hemorrhage ENT: Negative Runny nose, negative ear pain, negative tinnitus, negative gingival bleeding, NG tube present suctioning dark frank blood Neck:  Negative scars, masses, torticollis, lymphadenopathy, JVD Lungs: Clear to auscultation bilaterally without wheezes or crackles Cardiovascular: Regular rate and rhythm without murmur gallop or rub normal S1 and S2 Abdomen: Positive abdominal pain to palpation greatest in the LUQ, negative dysphagia, positive distention, firm to palpation, positive bowel sounds positive, no rebound, no ascites, no appreciable mass Extremities: No significant cyanosis, clubbing, or edema bilateral  lower extremities Psychiatric:  Negative depression,  negative anxiety, negative fatigue, negative mania Neurologic:  Cranial nerves II through XII intact, tongue/uvula midline, all extremities muscle strength 5/5, sensation intact throughout, negative dysarthria, negative expressive aphasia, negative receptive aphasia.      Data Reviewed: Basic Metabolic Panel:  Recent Labs Lab 01/18/15 1243 01/18/15 2333 01/19/15 0434 01/21/15 0548 01/22/15 0406  NA 128*  --  127* 136 138  K 3.9  --  4.4 4.1 3.9  CL 97*  --  97* 106 108  CO2  --   --  21* 26 23  GLUCOSE 151*  --  138* 108* 133*  BUN 34*  --  32* 9 7  CREATININE 1.10*  --  1.17* 0.78 0.78  CALCIUM  --   --  7.4* 7.0* 7.1*  MG  --  2.4  --  2.3  --   PHOS  --  2.7  --   --   --    Liver Function Tests:  Recent Labs Lab 01/18/15 1228 01/19/15 0434 01/21/15 0548 01/22/15 0406  AST 28 23 15 16   ALT 14 11* 10* 12*  ALKPHOS 52 38 39 42  BILITOT 1.3* 0.6 0.6 0.6  PROT 7.1 5.9* 5.5* 5.8*  ALBUMIN 4.0 3.2* 2.8* 3.1*    Recent Labs Lab 01/18/15 1228  LIPASE 20*   No results for input(s): AMMONIA in the last 168 hours. CBC:  Recent Labs Lab 01/18/15 1228 01/18/15 1243 01/19/15 0434 01/20/15 0458 01/21/15 0548  WBC 8.7  --  11.7* 7.5 6.4  NEUTROABS 7.8*  --   --   --  4.5  HGB 12.1 13.6 10.2* 8.9* 8.8*  HCT 35.3* 40.0 30.5* 27.0* 27.1*  MCV 94.6  --  96.2 98.9 100.0  PLT 238  --  187 151 149*   Cardiac Enzymes:  Recent Labs Lab 01/18/15 2333 01/19/15 0434 01/20/15 0013 01/20/15 2015 01/21/15 0548  TROPONINI 0.60* 0.40* 0.06* 0.07* 0.05*   BNP (last 3 results)  Recent Labs  01/18/15 1228 01/22/15 0405  BNP 1610.0* 1857.1*    ProBNP (last 3 results)  Recent Labs  03/01/14 1930  PROBNP 2785.0*    CBG:  Recent Labs Lab 01/18/15 2033 01/18/15 2352 01/19/15 0450  GLUCAP 127* 126* 131*    Recent Results (from the past 240 hour(s))  MRSA PCR Screening     Status: None   Collection Time: 01/18/15  6:58 PM  Result Value Ref Range  Status   MRSA by PCR NEGATIVE NEGATIVE Final    Comment:        The GeneXpert MRSA Assay (FDA approved for NASAL specimens only), is one component of a comprehensive MRSA colonization surveillance program. It is not intended to diagnose MRSA infection nor to guide or monitor treatment for MRSA infections.      Studies:  Recent x-ray studies have been reviewed in detail by the Attending Physician  Scheduled Meds:  Scheduled Meds: . aspirin EC  81 mg Oral Daily  . carvedilol  3.125 mg Oral BID WC  . folic acid  1 mg Intravenous Daily  . furosemide  40 mg Intravenous Daily  . sodium chloride  3 mL Intravenous Q12H  . thiamine  100 mg Intravenous Daily    Time spent on care of this patient: 40 mins   Marna Weniger, Geraldo Docker , MD  [facility] Office  [phone] Pager - [phone]  On-Call/Text Page:      [email or web address]      password  [email or web address]  If 7PM-7AM, please contact night-coverage [email or web address] Password [facility] 01/22/2015, 9:34 PM   LOS: 4 days   Care during the described time interval was provided by me .  I have reviewed this patient's available data, including medical history, events of note, physical examination, and all test results as part of my evaluation. I have personally reviewed and interpreted all radiology studies.   Dia Crawford, MD [phone] Pager

## 2015-01-22 NOTE — Clinical Documentation Improvement (Signed)
  Supporting Information  Admitted w/ bowel obstruction/GIB/ABLA  7/12 nursing note: Pt called that she couldn't catch her breath, was anxious and tachycardic. Patient was placed on a non-rebreather and 1mg  of morphine was given. Oxygen saturation was 100% initially but quickly dropped to low 70s. Patient was anxious and diaphoretic, with heart rate of 150s. Patient was placed on bipap with the assistance of respiratory and rapid response.Belenda Cruise schorr of [facility] is on her way to evaluate patient  1 RTT note:  Was walking by patient's room and heard her call for help saying that she could not breath.RN was in room with patient. Patient was on 2L [place] and her sat was 71%. Placed patient on NRB and her sats did not increase. Patient was struggling to breath using accessory muscles, unable to complete sentences and had wet breath sounds. Rapid response called. Placed patient on BIPAP 18/8, rate 10, FIO2 100%. Decreased to 60% and ABG was obtained.         7/12 NP progress note: ABG reveals pH-7.26, pC02-47.7, p02-203 and bicarb-20.9. CXR findings c/w pulmonary edema (or pneumonia). BNP 1610.0 on admission.  Assessment/Plan: 1. Acute respiratory distress: Given clinical picture (no CP, no cough, fever or leukocytosis) will give lasix 40 mg IV and attempt to wean off BiPAP. IVF's decreased to [facility] for now.    Component     Latest Ref Rng 01/22/2015          FIO2      0.60  Delivery systems      BILEVEL POSITIVE AIRWAY PRESSURE  Rate      10  Inspiratory PAP      18  Expiratory PAP      8  pH, Arterial     7.350 - 7.450 7.261 (L)  pCO2 arterial     35.0 - 45.0 mmHg 47.7 (H)  pO2, Arterial     80.0 - 100.0 mmHg 203 (H)  Bicarbonate     20.0 - 24.0 mEq/L 20.9  TCO2     0 - 100 mmol/L 22.3  Acid-base deficit     0.0 - 2.0 mmol/L 5.1 (H)  O2 Saturation      99.1  Patient temperature      98.0  Collection site      LEFT RADIAL  Drawn by      [phone]  Sample type  ARTERIAL DRAW  Allens test (pass/fail)     PASS PASS    Treatment: ABGs Continuous pulse ox O2 therapy; Keep sats greater than 92% BiPAP  After study, please clarify respiratory status in progress notes and discharge summary . Document acuity: --Acute respiratory failure --Chronic respiratory failure --Acute on chronic respiratory failure --Other --Unable to determine  . Document inclusion of: --Hypoxia --Hypercapnia  . Document any associated diagnoses/conditions   Thank You,   Naryiah Schley T. Pricilla Handler, MSN, MBA/MHA Clinical Documentation Specialist [email or web address] Office # [phone]

## 2015-01-22 NOTE — Procedures (Signed)
Indication:  Possible sigmoid stricture with possible mass on abdominal CT. Prep:  Tap water enema Meds:  Versed 2 mg + fentanyl 50 mcg IV  Procedure:  After reviewing the procedure with the patient, including the potential risks of perforation and hemorrhage, informed consent was signed.  The patient was monitored with pulse oximetry and cardiac monitor throughout. She was maintained on low flow oxygen and IV fluids were continued.  Using a Pentax video colonoscope, the rectum was intubated after normal digital examination.  The scope was inserted and advanced under direct visualization to 60 cm. There was stool in the lumen.  Diverticulosis was moderate through the sigmoid colon.  The mucosa was intact and appeared normal throughout the entire portion of the rectosigmoid that was examined.  More proximal passage of the scope was hampered by stool in the lumen, acute angulation of the colon, and patient discomfort when this was attempted. No other pathology was identified.  He scope was slowly withdrawn with careful retrograde inspection without additional finding.  This was tolerated without difficulty, the patient remaining stable throughout the duration of the procedure.  She was returned to recovery in table condition.  Results were reviewed with her family members.  Assessment:  Sigmoid obstruction, at least partially relieved with passage of flatus and stool.  No intrinsic colon abnormality identified in the rectosigmoid segment visualized.  Possible pathology more proximally.  Recommendation: Pursue diagnostic evaluation with repeat abdominal CT tomorrow, to reassess the sigmoid obstruction previously identified.  Mayme Genta, MD, [facility]

## 2015-01-22 NOTE — Progress Notes (Signed)
[facility] Progress Note  1 Day Post-Op  Subjective: Pt doing okay this morning, bloated, but not much pain.  No N/V, NG tube in place with only 22mL output.  Said she's having some flatus and BM's yesterday with enema.  Currently on BiPAP due to respiratory distress, but able to communicate well with me.  She says she had her sig yesterday and that it was "not conclusive", she is unaware if he took biopsies.  She's under the the impression that she will not need surgery.   Objective: Vital signs in last 24 hours: Temp:  [97.7 F (36.5 C)-99.9 F (37.7 C)] 98.2 F (36.8 C) (07/12 0400) Pulse Rate:  [30-149] 70 (07/12 0600) Resp:  [12-38] 12 (07/12 0600) BP: (133-186)/(63-126) 139/72 mmHg (07/12 0600) SpO2:  [72 %-100 %] 100 % (07/12 0600) FiO2 (%):  [40 %-100 %] 40 % (07/12 0428) Last BM Date: 01/20/15  Intake/Output from previous day: 07/11 0701 - 07/12 0700 In: 1528 [I.V.:1528] Out: 1625 [Urine:1555; Emesis/NG output:70] Intake/Output this shift:    PE: Gen:  Alert, mild respiratory distress, pleasant Pulm:  On Bipap Abd: Soft, distended, minimally tender, +BS, no HSM Ext:  Left leg more swollen than right   Lab Results:   Recent Labs  01/20/15 0458 01/21/15 0548  WBC 7.5 6.4  HGB 8.9* 8.8*  HCT 27.0* 27.1*  PLT 151 149*   BMET  Recent Labs  01/21/15 0548 01/22/15 0406  NA 136 138  K 4.1 3.9  CL 106 108  CO2 26 23  GLUCOSE 108* 133*  BUN 9 7  CREATININE 0.78 0.78  CALCIUM 7.0* 7.1*   PT/INR  Recent Labs  01/20/15 1524  LABPROT 14.9  INR 1.15   CMP     Component Value Date/Time   NA 138 01/22/2015 0406   K 3.9 01/22/2015 0406   K 5.0 05/31/2013 1552   CL 108 01/22/2015 0406   CO2 23 01/22/2015 0406   GLUCOSE 133* 01/22/2015 0406   BUN 7 01/22/2015 0406   CREATININE 0.78 01/22/2015 0406   CALCIUM 7.1* 01/22/2015 0406   PROT 5.8* 01/22/2015 0406   ALBUMIN 3.1* 01/22/2015 0406   AST 16 01/22/2015 0406   ALT 12* 01/22/2015  0406   ALKPHOS 42 01/22/2015 0406   BILITOT 0.6 01/22/2015 0406   GFRNONAA >60 01/22/2015 0406   GFRAA >60 01/22/2015 0406   Lipase     Component Value Date/Time   LIPASE 20* 01/18/2015 1228       Studies/Results: Dg Chest Port 1 View  01/22/2015   CLINICAL DATA:  Shortness of Breath  EXAM: PORTABLE CHEST - 1 VIEW  COMPARISON:  01/18/2015  FINDINGS: Interval placement of an enteric tube. Tip is off the field of view but below the left hemidiaphragm. Interval placement of a right PICC catheter with tip projected over the cavoatrial junction. No pneumothorax. Tubing projected over the right chest may be external or could indicate a chest tube. Mild cardiac enlargement without significant vascular congestion. Patchy infiltration in the lungs, increasing since prior study. This could indicate pneumonia or edema. No blunting of costophrenic angles. No pneumothorax. Calcified and tortuous aorta. Degenerative changes in the shoulders.  IMPRESSION: Appliances appear to be in satisfactory location. Cardiac enlargement. Infiltration in both lungs may indicate pneumonia or edema. No pneumothorax.   Electronically Signed   By: Lucienne Capers M.D.   On: 01/22/2015 03:44    Anti-infectives: Anti-infectives    None  Assessment/Plan Sigmoid obstruction - mass or stricture Diverticulosis Normocytic Anemia ?blood loss STEMI - small non-Q wave MI Right adrenal Mass. CHF COPD  1. At this time patient has no emergent, peritoneal signs for surgery.  Patient has NG tube in place. Agree with stopping anticoagulation and Plavix at this time. Pt underwent sigmoidoscopy with Dr. Thana Farr yesterday bu no note yet.  Would like results of sig and biopsies if obtained prior to proceeding with surgery. 2. She would require a Hartmann type procedure secondary to the dilatation of her proximal bowel, but due to her bad heart she may only qualify for a loop colostomy.  She is a high-risk operative candidate  as well. 3.  Rapid response was called on patient for acute SOB, she was placed on BiPAP, given lasix    LOS: 4 days    Nat Christen 01/22/2015, 7:25 AM Pager: [phone]

## 2015-01-22 NOTE — Significant Event (Signed)
Rapid Response Event Note Called per floor RN regarding Pt with new onset SOB. Pt called out stated "I can't breath!" RT already at bedside, placed Pt on NRB with po2 only 70s%  Overview: Time Called: 0242 Arrival Time: 0248 Event Type: Respiratory  Initial Focused Assessment: Pt found sitting in bed in clear respiratory distress. PT States "I CANT BREATH" RR 35-40, Po2 72-74% on NRB. Lungs auscultated with tight lung sounds and crackles heard in bilateral upper and lower lobes. HR 130s ST. Pt denies Chest pain at this time.   Interventions: Alanda Slim NP [facility] paged upon my arrival to room. Morphine 1mg  IVP given and Pt placed on Bipap per RT. ABG and CXR orders placed STAT.  As of 0300 Pt HR 115-120, rr 20-25, appears much more comfortable. ABG and CXR in process. Call back received from K.Schor NP. Updated per floor RN regarding Pt status and interventions. NP en route to see Pt at bedside now.   0325-Pt left resting quietly in bed. Po2 100% on 40% fio2 Bipap, rr 15-20 ABG resulted. Bedside to page results and status update to [facility] NP if not at bedside soon.  Event Summary: Name of Physician Notified: Alanda Slim [facility] NP  at 0250    at          [place], [place]

## 2015-01-23 ENCOUNTER — Telehealth: Payer: Self-pay | Admitting: Internal Medicine

## 2015-01-23 LAB — CBC
HCT: 28.7 % — ABNORMAL LOW (ref 36.0–46.0)
Hemoglobin: 9.5 g/dL — ABNORMAL LOW (ref 12.0–15.0)
MCH: 32.4 pg (ref 26.0–34.0)
MCHC: 33.1 g/dL (ref 30.0–36.0)
MCV: 98 fL (ref 78.0–100.0)
PLATELETS: 144 10*3/uL — AB (ref 150–400)
RBC: 2.93 MIL/uL — ABNORMAL LOW (ref 3.87–5.11)
RDW: 12.8 % (ref 11.5–15.5)
WBC: 7 10*3/uL (ref 4.0–10.5)

## 2015-01-23 LAB — BASIC METABOLIC PANEL
ANION GAP: 13 (ref 5–15)
BUN: 9 mg/dL (ref 6–20)
CHLORIDE: 101 mmol/L (ref 101–111)
CO2: 24 mmol/L (ref 22–32)
CREATININE: 0.93 mg/dL (ref 0.44–1.00)
Calcium: 7 mg/dL — ABNORMAL LOW (ref 8.9–10.3)
GFR calc Af Amer: >60 mL/min (ref >60)
GFR, EST NON AFRICAN AMERICAN: 55 mL/min — AB (ref >60)
Glucose, Bld: 70 mg/dL (ref 65–99)
POTASSIUM: 3.7 mmol/L (ref 3.5–5.1)
Sodium: 138 mmol/L (ref 135–145)

## 2015-01-23 LAB — BRAIN NATRIURETIC PEPTIDE: B NATRIURETIC PEPTIDE 5: 990.7 pg/mL — AB (ref 0.0–100.0)

## 2015-01-23 LAB — TROPONIN I: TROPONIN I: 0.04 ng/mL — AB (ref <0.031)

## 2015-01-23 MED ORDER — SODIUM CHLORIDE 0.9 % IV SOLN: 250.0000 mL | INTRAVENOUS | Status: DC | PRN

## 2015-01-23 MED ORDER — PRAVASTATIN SODIUM 80 MG PO TABS
80.0000 mg | ORAL_TABLET | Freq: Every day | ORAL | Status: DC
  Administered 2015-01-23 – 2015-01-27 (×5): 80 mg via ORAL
  Filled 2015-01-23 (×2): qty 1
  Filled 2015-01-23: qty 2
  Filled 2015-01-23 (×2): qty 1
  Filled 2015-01-23: qty 2

## 2015-01-23 MED ORDER — SODIUM CHLORIDE 0.9 % IV SOLN
INTRAVENOUS | Status: DC
  Administered 2015-01-24: 06:00:00 via INTRAVENOUS

## 2015-01-23 MED ORDER — FOLIC ACID 1 MG PO TABS
1.0000 mg | ORAL_TABLET | Freq: Every day | ORAL | Status: DC
  Administered 2015-01-24 – 2015-01-28 (×5): 1 mg via ORAL
  Filled 2015-01-23 (×6): qty 1

## 2015-01-23 MED ORDER — ASPIRIN 81 MG PO CHEW
81.0000 mg | CHEWABLE_TABLET | ORAL | Status: AC
  Administered 2015-01-24: 81 mg via ORAL
  Filled 2015-01-23: qty 1

## 2015-01-23 MED ORDER — POTASSIUM CHLORIDE CRYS ER 20 MEQ PO TBCR
40.0000 meq | EXTENDED_RELEASE_TABLET | Freq: Every day | ORAL | Status: DC
  Administered 2015-01-23 – 2015-01-28 (×6): 40 meq via ORAL
  Filled 2015-01-23 (×6): qty 2

## 2015-01-23 MED ORDER — SODIUM CHLORIDE 0.9 % IJ SOLN: 3.0000 mL | INTRAMUSCULAR | Status: DC | PRN

## 2015-01-23 MED ORDER — SODIUM CHLORIDE 0.9 % IJ SOLN: 3.0000 mL | Freq: Two times a day (BID) | INTRAMUSCULAR | Status: DC

## 2015-01-23 MED ORDER — VITAMIN B-1 100 MG PO TABS
100.0000 mg | ORAL_TABLET | Freq: Every day | ORAL | Status: DC
  Administered 2015-01-24 – 2015-01-28 (×5): 100 mg via ORAL
  Filled 2015-01-23 (×6): qty 1

## 2015-01-23 MED ORDER — ASPIRIN 81 MG PO TABS: 81.0000 mg | ORAL_TABLET | Freq: Every day | ORAL | Status: DC

## 2015-01-23 NOTE — Telephone Encounter (Signed)
Patient's daughter returned a missed call. I let her know it might have been Dr.Letvak because he checks up on patients when they're in the hospital.  Patient's daughter wanted to let Dr.Letvak know the GI issue has been resolved, but she's having a cardiac cath. Tomorrow.

## 2015-01-23 NOTE — Progress Notes (Signed)
[facility] TEAM 1 - Stepdown/ICU TEAM Progress Note  Haley Harris XTG:[id number] DOB: [birth year] DOA: 01/18/2015 PCP: [name], MD  Admit HPI / Brief Narrative: 81 year old F Hx Anxiety, Combined Systolic and Diastolic CHF, HTN, HLD, Carotid Artery Stenosis, CAD with prior NSTEMI, Dyslipidemia, PVD associated neuropathy, COPD, GERD, Osteoporosis, and Chronic Venous insufficiency who presented to the ER c/o abdominal pain which began abruptly with swelling and discomfort. Her last BM was 2 days prior. She became short of breath after the development of the abdominal distention.   In the ER a CT of the abdomen revealed a distal colonic obstruction in the mid sigmoid colon with appearance highly suspicious for obstructing colon adenocarcinoma. There was significant colonic distention with air in the colon. There was also an incidental finding of 3.1 cm indeterminate right adrenal mass along with tiny hepatic cysts. Labs revealed hyponatremia with a sodium 128 but BUN and creatinine at baseline of 34/1.10.   HPI/Subjective: The patient is resting comfortably in bed.  She denies any abdominal pain at present time.  She had very loose/liquid stool last night.  Her appetite is intact.  She denies shortness of breath or chest pain.  Assessment/Plan:  Mid sigmoid colonic obstruction  -initial CT worrisome for obstructing mass/neoplasm -CEA negative -CT chest negative metastatic disease -Sigmoidoscopy 01/21/2015 noted significant stool decreasing quality of exam but no obvious mass/stricture appreciated  -Follow up CT abdomen 7/12 noted resolving distal colonic sigmoid obstruction with short segment of sigmoid circumferential wall thickening and extensive sigmoid diverticulosis  -CCS has evaluated - no indication for surgical intervention thus far -persistent thickening of sigmoid colon will require further evaluation in time - ?repeat CT v/s full colonoscopy - defer to GI to make formal  recs  Acute blood loss anemia / ?GI bleed -NG suction returned dark bilious b/s blood stained fluid - transfuse if hemoglobin < 8 - appears to have stabilized / Hgb presently climbing - perhaps Hgb drop was simply due to dilution? - follow   Acute hyponatremia -Likely due to hypovolemia - corrected   Leg edema, left -LLE Doppler negative for DVT/SVT   Right adrenal mass -MRI abdomen pelvis nondiagnostic - diff includes adrenal adenoma, with adrenal metastasis considered less likely - f/u CT in 6 months suggested v/s outpt PET once cleared for d/c   CAD in native artery / elevated troponin -Cardiac enzymes trending down -Cardiology following - considering coronary eval this hospitalization    Acute exacerbation of chronic combined systolic and diastolic CHF, NYHA class 2 -most recent 2D echo with EF 40-45% -suffered acute episode of pulmonary edema night of 7/12 - quickly stabilized w/ diuresis - wean O2 as able -Cards following  -Daily weights   Acute hypoxic respiratory failure due to pulmonary edema  -see above   CKD, stage III -Current renal function stable at baseline  Dyslipidemia -resume usual home tx   COPD -Currently compensated without evidence of wheezing or hypoxemia -Continue albuterol nebs PRN  Neuropathy due to peripheral vascular disease  Code Status: FULL Family Communication: Daughter present at time of exam Disposition Plan: Stable for transfer to telemetry bed - PT/OT - follow oral intake - await GI recommendations concerning thickened sigmoid colon - possible inpatient coronary evaluation being considered by cardiology  Consultants: Dr.Mohan [facility] (Cardiology) Dr. Richmond Campbell (GI) Dr.Armando Rosendo Gros (CCS)   Procedure/Significant Events: 7/8 CT abdomen pelvis with contrast Distal colonic obstruction mid sigmoid colon, highly suspicious for obstructing colon adenocarcinoma -Sigmoid diverticulosis -No definite evidence of metastatic  disease within  the abdomen or pelvis. - 3.1 cm indeterminate right adrenal mass and probable tiny hepatic cysts.  7/9 LLE venous Doppler negative DVT/SVT 7/9 MRI abdomen W/Wo contrast -3.0 cm right adrenal mass nonspecific MR characteristics -atypical adrenal adenoma, vs adrenal metastasis -Consider PET-CT scan for further evaluation, or continued followup by CT in 6 months. 7/9 CT chest WO contrast; negative metastatic disease in the chest. -Severe compression fracture deformities at T6 and T12. Moderate compression fracture deformity at T8. 7/9 echocardiogram EF 40% to 45% - (grade 1 diastolic dysfunction).  Antibiotics: None  DVT prophylaxis: SCD  Objective: Blood pressure 144/75, pulse 82, temperature 98.2 F (36.8 C), temperature source Oral, resp. rate 14, height 5\' 6"  (1.676 m), weight 78.654 kg (173 lb 6.4 oz), SpO2 98 %.  Intake/Output Summary (Last 24 hours) at 01/23/15 1141 Last data filed at 01/23/15 1118  Gross per 24 hour  Intake    690 ml  Output   3470 ml  Net  -2780 ml   Exam: General: No acute respiratory distress - alert and conversant Lungs: Clear to auscultation bilaterally without wheezes / crackles Cardiovascular: Regular rate and rhythm without murmur gallop or rub  Abdomen: Nontender, soft, bowel sounds hypoactive, no rebound, no ascites, no appreciable mass Extremities: No significant cyanosis, clubbing, edema bilateral lower extremities  Data Reviewed: Basic Metabolic Panel:  Recent Labs Lab 01/18/15 1243 01/18/15 2333 01/19/15 0434 01/21/15 0548 01/22/15 0406 01/23/15 0535  NA 128*  --  127* 136 138 138  K 3.9  --  4.4 4.1 3.9 3.7  CL 97*  --  97* 106 108 101  CO2  --   --  21* 26 23 24   GLUCOSE 151*  --  138* 108* 133* 70  BUN 34*  --  32* 9 7 9   CREATININE 1.10*  --  1.17* 0.78 0.78 0.93  CALCIUM  --   --  7.4* 7.0* 7.1* 7.0*  MG  --  2.4  --  2.3  --   --   PHOS  --  2.7  --   --   --   --    Liver Function Tests:  Recent Labs Lab  01/18/15 1228 01/19/15 0434 01/21/15 0548 01/22/15 0406  AST 28 23 15 16   ALT 14 11* 10* 12*  ALKPHOS 52 38 39 42  BILITOT 1.3* 0.6 0.6 0.6  PROT 7.1 5.9* 5.5* 5.8*  ALBUMIN 4.0 3.2* 2.8* 3.1*    Recent Labs Lab 01/18/15 1228  LIPASE 20*   CBC:  Recent Labs Lab 01/18/15 1228 01/18/15 1243 01/19/15 0434 01/20/15 0458 01/21/15 0548 01/23/15 0535  WBC 8.7  --  11.7* 7.5 6.4 7.0  NEUTROABS 7.8*  --   --   --  4.5  --   HGB 12.1 13.6 10.2* 8.9* 8.8* 9.5*  HCT 35.3* 40.0 30.5* 27.0* 27.1* 28.7*  MCV 94.6  --  96.2 98.9 100.0 98.0  PLT 238  --  187 151 149* 144*   Cardiac Enzymes:  Recent Labs Lab 01/19/15 0434 01/20/15 0013 01/20/15 2015 01/21/15 0548 01/23/15 0535  TROPONINI 0.40* 0.06* 0.07* 0.05* 0.04*   BNP (last 3 results)  Recent Labs  01/18/15 1228 01/22/15 0405 01/23/15 0535  BNP 1610.0* 1857.1* 990.7*    ProBNP (last 3 results)  Recent Labs  03/01/14 1930  PROBNP 2785.0*    CBG:  Recent Labs Lab 01/18/15 2033 01/18/15 2352 01/19/15 0450  GLUCAP 127* 126* 131*    Recent Results (from  the past 240 hour(s))  MRSA PCR Screening     Status: None   Collection Time: 01/18/15  6:58 PM  Result Value Ref Range Status   MRSA by PCR NEGATIVE NEGATIVE Final    Comment:        The GeneXpert MRSA Assay (FDA approved for NASAL specimens only), is one component of a comprehensive MRSA colonization surveillance program. It is not intended to diagnose MRSA infection nor to guide or monitor treatment for MRSA infections.     Studies:  Recent x-ray studies have been reviewed in detail by the Attending Physician  Scheduled Meds:  Scheduled Meds: . aspirin EC  81 mg Oral Daily  . carvedilol  3.125 mg Oral BID WC  . folic acid  1 mg Intravenous Daily  . furosemide  40 mg Intravenous Daily  . sodium chloride  3 mL Intravenous Q12H  . thiamine  100 mg Intravenous Daily    Time spent on care of this patient: 35 mins  Cherene Altes, MD [facility] For Consults/Admissions - Flow Manager - [phone] Office  [phone]  Contact MD directly via text page:      [email or web address]      password [email or web address]  01/23/2015, 11:41 AM   LOS: 5 days

## 2015-01-23 NOTE — Telephone Encounter (Signed)
I did speak to her today

## 2015-01-23 NOTE — Progress Notes (Signed)
2 Days Post-Op  Subjective: She feels better No abdominal pain  Objective: Vital signs in last 24 hours: Temp:  [98 F (36.7 C)-98.9 F (37.2 C)] 98 F (36.7 C) (07/13 0400) Pulse Rate:  [71-88] 72 (07/13 0600) Resp:  [14-20] 14 (07/13 0600) BP: (118-166)/(60-98) 128/66 mmHg (07/13 0600) SpO2:  [96 %-100 %] 99 % (07/13 0600) FiO2 (%):  [40 %] 40 % (07/12 0820) Weight:  [78.654 kg (173 lb 6.4 oz)] 78.654 kg (173 lb 6.4 oz) (07/13 0500) Last BM Date: 01/23/15  Intake/Output from previous day: 07/12 0701 - 07/13 0700 In: 530 [P.O.:160; I.V.:350; NG/GT:20] Out: 2770 [Urine:2750; Emesis/NG output:20] Intake/Output this shift:    Abdomen soft, non tender, mildly full  Lab Results:   Recent Labs  01/21/15 0548 01/23/15 0535  WBC 6.4 7.0  HGB 8.8* 9.5*  HCT 27.1* 28.7*  PLT 149* 144*   BMET  Recent Labs  01/22/15 0406 01/23/15 0535  NA 138 138  K 3.9 3.7  CL 108 101  CO2 23 24  GLUCOSE 133* 70  BUN 7 9  CREATININE 0.78 0.93  CALCIUM 7.1* 7.0*   PT/INR  Recent Labs  01/20/15 1524  LABPROT 14.9  INR 1.15   ABG  Recent Labs  01/22/15 0304  PHART 7.261*  HCO3 20.9    Studies/Results: Ct Abdomen Pelvis W Contrast  01/22/2015   CLINICAL DATA:  Sigmoid obstruction, status post sigmoidoscopy possible rectal cancer.  EXAM: CT ABDOMEN AND PELVIS WITH CONTRAST  TECHNIQUE: Multidetector CT imaging of the abdomen and pelvis was performed using the standard protocol following bolus administration of intravenous contrast.  CONTRAST:  173mL OMNIPAQUE IOHEXOL 300 MG/ML  SOLN  COMPARISON:  01/18/2015, 01/19/2015  FINDINGS: Lower chest: Trace dependent pleural effusions with left greater than right compressive basilar atelectasis. Heart is enlarged. Aortic and coronary atherosclerosis noted. Mitral valve annular calcifications present. No pericardial effusion. NG tube enters the stomach with the tip noted distally.  Abdomen: Punctate small scattered hepatic  hypodensities, suspect small hepatic cysts. No biliary dilatation or obstruction. Patent hepatic and portal veins. Gallbladder, biliary system, pancreas, spleen, and left adrenal gland within normal limits for age and demonstrate no acute process.  Kidneys demonstrate mild cortical atrophy but no obstruction or hydronephrosis.  Stable enhancing left adrenal 3 cm mass which was previously evaluated by MRI on 01/19/2015. This lesion remains indeterminate for atypical adrenal adenoma versus metastasis.  No abdominal free fluid, fluid collection, hemorrhage, abscess, or adenopathy.  Extensive abdominal atherosclerosis and tortuosity. No aneurysm or occlusive process.  Oral contrast throughout the distal small bowel and colon. There is less colonic distension compatible with resolving sigmoid obstruction. No free air evident.  Pelvis: The left colon and sigmoid both demonstrate diverticular changes. There is a short segment of wall thickening of the proximal sigmoid colon, image 68 without associated inflammatory process which could be related to an underlying mural lesion versus sigmoid stricture. Please refer to the recent sigmoidoscopy.  Prior hysterectomy noted. Trace pelvic free fluid. No new pelvic collection, hemorrhage, abscess, or adenopathy. Iliac atherosclerosis noted with tortuosity. No inguinal abnormality or hernia. Urinary bladder unremarkable.  Bones are osteopenic. Postop changes of the lower lumbar spine. Chronic compression fracture at T12.  IMPRESSION: Resolving distal colonic sigmoid obstruction. Short segment of sigmoid circumferential wall thickening remains suspicious for underlying sigmoid mural lesion versus sigmoid stricture.  Extensive sigmoid diverticulosis  Stable trace pelvic free fluid  Extensive aortoiliac atherosclerosis  Small pleural effusions with compressive basilar atelectasis  Cardiomegaly  with coronary calcifications  Small hepatic cysts  Stable 3 cm right adrenal mass. Please  refer to the prior MRI for further characterization.   Electronically Signed   By: Jerilynn Mages.  Shick M.D.   On: 01/22/2015 18:37   Dg Chest Port 1 View  01/22/2015   CLINICAL DATA:  Shortness of Breath  EXAM: PORTABLE CHEST - 1 VIEW  COMPARISON:  01/18/2015  FINDINGS: Interval placement of an enteric tube. Tip is off the field of view but below the left hemidiaphragm. Interval placement of a right PICC catheter with tip projected over the cavoatrial junction. No pneumothorax. Tubing projected over the right chest may be external or could indicate a chest tube. Mild cardiac enlargement without significant vascular congestion. Patchy infiltration in the lungs, increasing since prior study. This could indicate pneumonia or edema. No blunting of costophrenic angles. No pneumothorax. Calcified and tortuous aorta. Degenerative changes in the shoulders.  IMPRESSION: Appliances appear to be in satisfactory location. Cardiac enlargement. Infiltration in both lungs may indicate pneumonia or edema. No pneumothorax.   Electronically Signed   By: Lucienne Capers M.D.   On: 01/22/2015 03:44    Anti-infectives: Anti-infectives    None      Assessment/Plan: s/p Procedure(s): FLEXIBLE SIGMOIDOSCOPY (N/A)  Sigmoid diverticulitis  Based on improvement on CT and sigmoidoscopy, I suspect she had narrowing secondary to diverticulitis. Will d/c NG and start liquids  LOS: 5 days    Chinaza Rooke A 01/23/2015

## 2015-01-23 NOTE — Progress Notes (Signed)
Subjective:  Patient denies any chest pain.  States breathing has markedly improved.  Denies any abdominal pain.  Complains of diarrhea  Objective:  Vital Signs in the last 24 hours: Temp:  [97.8 F (36.6 C)-98.9 F (37.2 C)] 97.8 F (36.6 C) (07/13 1156) Pulse Rate:  [71-94] 92 (07/13 1156) Resp:  [14-20] 16 (07/13 1156) BP: (120-158)/(60-96) 120/68 mmHg (07/13 1156) SpO2:  [96 %-100 %] 98 % (07/13 0744) Weight:  [78.654 kg (173 lb 6.4 oz)] 78.654 kg (173 lb 6.4 oz) (07/13 0500)  Intake/Output from previous day: 07/12 0701 - 07/13 0700 In: 46 [P.O.:160; I.V.:350; NG/GT:20] Out: 2770 [Urine:2750; Emesis/NG output:20] Intake/Output from this shift: Total I/O In: 360 [P.O.:360] Out: 1400 [Urine:1400]  Physical Exam: Neck: no adenopathy, no carotid bruit, no JVD and supple, symmetrical, trachea midline Lungs: decreased breath sounds at bases with faint rales Heart: regular rate and rhythm, S1, S2 normal and soft Systolic murmur and S3 gallop noted Abdomen: soft, non-tender; bowel sounds normal; no masses,  no organomegaly Extremities: extremities normal, atraumatic, no cyanosis or edema  Lab Results:  Recent Labs  01/21/15 0548 01/23/15 0535  WBC 6.4 7.0  HGB 8.8* 9.5*  PLT 149* 144*    Recent Labs  01/22/15 0406 01/23/15 0535  NA 138 138  K 3.9 3.7  CL 108 101  CO2 23 24  GLUCOSE 133* 70  BUN 7 9  CREATININE 0.78 0.93    Recent Labs  01/21/15 0548 01/23/15 0535  TROPONINI 0.05* 0.04*   Hepatic Function Panel  Recent Labs  01/22/15 0406  PROT 5.8*  ALBUMIN 3.1*  AST 16  ALT 12*  ALKPHOS 42  BILITOT 0.6   No results for input(s): CHOL in the last 72 hours. No results for input(s): PROTIME in the last 72 hours.  Imaging: Imaging results have been reviewed and Ct Abdomen Pelvis W Contrast  01/22/2015   CLINICAL DATA:  Sigmoid obstruction, status post sigmoidoscopy possible rectal cancer.  EXAM: CT ABDOMEN AND PELVIS WITH CONTRAST  TECHNIQUE:  Multidetector CT imaging of the abdomen and pelvis was performed using the standard protocol following bolus administration of intravenous contrast.  CONTRAST:  165mL OMNIPAQUE IOHEXOL 300 MG/ML  SOLN  COMPARISON:  01/18/2015, 01/19/2015  FINDINGS: Lower chest: Trace dependent pleural effusions with left greater than right compressive basilar atelectasis. Heart is enlarged. Aortic and coronary atherosclerosis noted. Mitral valve annular calcifications present. No pericardial effusion. NG tube enters the stomach with the tip noted distally.  Abdomen: Punctate small scattered hepatic hypodensities, suspect small hepatic cysts. No biliary dilatation or obstruction. Patent hepatic and portal veins. Gallbladder, biliary system, pancreas, spleen, and left adrenal gland within normal limits for age and demonstrate no acute process.  Kidneys demonstrate mild cortical atrophy but no obstruction or hydronephrosis.  Stable enhancing left adrenal 3 cm mass which was previously evaluated by MRI on 01/19/2015. This lesion remains indeterminate for atypical adrenal adenoma versus metastasis.  No abdominal free fluid, fluid collection, hemorrhage, abscess, or adenopathy.  Extensive abdominal atherosclerosis and tortuosity. No aneurysm or occlusive process.  Oral contrast throughout the distal small bowel and colon. There is less colonic distension compatible with resolving sigmoid obstruction. No free air evident.  Pelvis: The left colon and sigmoid both demonstrate diverticular changes. There is a short segment of wall thickening of the proximal sigmoid colon, image 68 without associated inflammatory process which could be related to an underlying mural lesion versus sigmoid stricture. Please refer to the recent sigmoidoscopy.  Prior hysterectomy noted.  Trace pelvic free fluid. No new pelvic collection, hemorrhage, abscess, or adenopathy. Iliac atherosclerosis noted with tortuosity. No inguinal abnormality or hernia. Urinary  bladder unremarkable.  Bones are osteopenic. Postop changes of the lower lumbar spine. Chronic compression fracture at T12.  IMPRESSION: Resolving distal colonic sigmoid obstruction. Short segment of sigmoid circumferential wall thickening remains suspicious for underlying sigmoid mural lesion versus sigmoid stricture.  Extensive sigmoid diverticulosis  Stable trace pelvic free fluid  Extensive aortoiliac atherosclerosis  Small pleural effusions with compressive basilar atelectasis  Cardiomegaly with coronary calcifications  Small hepatic cysts  Stable 3 cm right adrenal mass. Please refer to the prior MRI for further characterization.   Electronically Signed   By: Jerilynn Mages.  Shick M.D.   On: 01/22/2015 18:37   Dg Chest Port 1 View  01/22/2015   CLINICAL DATA:  Shortness of Breath  EXAM: PORTABLE CHEST - 1 VIEW  COMPARISON:  01/18/2015  FINDINGS: Interval placement of an enteric tube. Tip is off the field of view but below the left hemidiaphragm. Interval placement of a right PICC catheter with tip projected over the cavoatrial junction. No pneumothorax. Tubing projected over the right chest may be external or could indicate a chest tube. Mild cardiac enlargement without significant vascular congestion. Patchy infiltration in the lungs, increasing since prior study. This could indicate pneumonia or edema. No blunting of costophrenic angles. No pneumothorax. Calcified and tortuous aorta. Degenerative changes in the shoulders.  IMPRESSION: Appliances appear to be in satisfactory location. Cardiac enlargement. Infiltration in both lungs may indicate pneumonia or edema. No pneumothorax.   Electronically Signed   By: Lucienne Capers M.D.   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 Ischemic cardiomyopathy. Hypertension. Hypercholesterolemia. COPD History of cerebrovascular disease. History of CVA of breast. Spinal stenosis. Vitamin D deficiency. Osteoporosis. History of compression fracture of thoracic spine with kyphosis Anemia questionable etiology PLAN Discussed with patient and her daughter at length various options of treatment, i.e., medical versus left, Possible PTCA stenting.  This risk and benefits, i.e., death, MI, stroke, need for emergency CABG, local vascular complications, etc., and consents for PCI  LOS: 5 days    Charolette Forward 01/23/2015, 2:49 PM

## 2015-01-24 DIAGNOSIS — I2510 Atherosclerotic heart disease of native coronary artery without angina pectoris: Secondary | ICD-10-CM

## 2015-01-24 DIAGNOSIS — J810 Acute pulmonary edema: Secondary | ICD-10-CM

## 2015-01-24 DIAGNOSIS — E279 Disorder of adrenal gland, unspecified: Secondary | ICD-10-CM

## 2015-01-24 DIAGNOSIS — D62 Acute posthemorrhagic anemia: Secondary | ICD-10-CM

## 2015-01-24 DIAGNOSIS — N183 Chronic kidney disease, stage 3 (moderate): Secondary | ICD-10-CM

## 2015-01-24 LAB — COMPREHENSIVE METABOLIC PANEL
ALK PHOS: 44 U/L (ref 38–126)
ALT: 12 U/L — AB (ref 14–54)
ANION GAP: 7 (ref 5–15)
AST: 18 U/L (ref 15–41)
Albumin: 3 g/dL — ABNORMAL LOW (ref 3.5–5.0)
BUN: 9 mg/dL (ref 6–20)
CHLORIDE: 97 mmol/L — AB (ref 101–111)
CO2: 29 mmol/L (ref 22–32)
CREATININE: 0.87 mg/dL (ref 0.44–1.00)
Calcium: 7.1 mg/dL — ABNORMAL LOW (ref 8.9–10.3)
GFR calc non Af Amer: 60 mL/min — ABNORMAL LOW (ref >60)
Glucose, Bld: 107 mg/dL — ABNORMAL HIGH (ref 65–99)
Potassium: 4.4 mmol/L (ref 3.5–5.1)
Sodium: 133 mmol/L — ABNORMAL LOW (ref 135–145)
Total Bilirubin: 0.5 mg/dL (ref 0.3–1.2)
Total Protein: 5.6 g/dL — ABNORMAL LOW (ref 6.5–8.1)

## 2015-01-24 MED ORDER — CLOPIDOGREL BISULFATE 75 MG PO TABS
300.0000 mg | ORAL_TABLET | ORAL | Status: AC
  Administered 2015-01-25: 300 mg via ORAL
  Filled 2015-01-24: qty 4

## 2015-01-24 MED ORDER — SODIUM CHLORIDE 0.9 % IJ SOLN: 3.0000 mL | INTRAMUSCULAR | Status: DC | PRN

## 2015-01-24 MED ORDER — SODIUM CHLORIDE 0.9 % IJ SOLN: 3.0000 mL | Freq: Two times a day (BID) | INTRAMUSCULAR | Status: DC

## 2015-01-24 MED ORDER — SODIUM CHLORIDE 0.9 % IV SOLN
INTRAVENOUS | Status: DC
  Administered 2015-01-25: 05:00:00 via INTRAVENOUS

## 2015-01-24 MED ORDER — SODIUM CHLORIDE 0.9 % IV SOLN: 250.0000 mL | INTRAVENOUS | Status: DC | PRN

## 2015-01-24 MED ORDER — CLOPIDOGREL BISULFATE 75 MG PO TABS: 300.0000 mg | ORAL_TABLET | ORAL | Status: DC

## 2015-01-24 MED ORDER — ASPIRIN 81 MG PO CHEW
81.0000 mg | CHEWABLE_TABLET | ORAL | Status: AC
  Administered 2015-01-25: 81 mg via ORAL
  Filled 2015-01-24: qty 1

## 2015-01-24 NOTE — Progress Notes (Signed)
Subjective:  Denies any chest pain states had mild shortness of breath earlier this morning. Abdominal pain resolved. Cardiac catheterization has been rescheduled for tomorrow morning due to delay in the procedure and long complex case earlier.  Objective:  Vital Signs in the last 24 hours: Temp:  [97.9 F (36.6 C)-98.9 F (37.2 C)] 98.3 F (36.8 C) (07/14 0308) Pulse Rate:  [74-79] 74 (07/14 0809) Resp:  [13-20] 17 (07/14 1300) BP: (123-160)/(66-78) 133/73 mmHg (07/14 1300) SpO2:  [95 %-100 %] 97 % (07/14 0809) Weight:  [78.6 kg (173 lb 4.5 oz)] 78.6 kg (173 lb 4.5 oz) (07/14 0308)  Intake/Output from previous day: 07/13 0701 - 07/14 0700 In: 360 [P.O.:360] Out: 2325 [Urine:2325] Intake/Output from this shift:    Physical Exam: Neck: no adenopathy, no carotid bruit, no JVD and supple, symmetrical, trachea midline Lungs: Decreased breath sound at bases Heart: regular rate and rhythm, S1, S2 normal and Soft systolic murmur noted Abdomen: soft, non-tender; bowel sounds normal; no masses,  no organomegaly Extremities: extremities normal, atraumatic, no cyanosis or edema  Lab Results:  Recent Labs  01/23/15 0535  WBC 7.0  HGB 9.5*  PLT 144*    Recent Labs  01/23/15 0535 01/24/15 0527  NA 138 133*  K 3.7 4.4  CL 101 97*  CO2 24 29  GLUCOSE 70 107*  BUN 9 9  CREATININE 0.93 0.87    Recent Labs  01/23/15 0535  TROPONINI 0.04*   Hepatic Function Panel  Recent Labs  01/24/15 0527  PROT 5.6*  ALBUMIN 3.0*  AST 18  ALT 12*  ALKPHOS 44  BILITOT 0.5   No results for input(s): CHOL in the last 72 hours. No results for input(s): PROTIME in the last 72 hours.  Imaging: Imaging results have been reviewed and No results found.  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 Ischemic cardiomyopathy. Hypertension. Hypercholesterolemia. COPD History of cerebrovascular disease. History of CVA of breast. Spinal stenosis. Vitamin D deficiency. Osteoporosis. History of compression fracture of thoracic spine with kyphosis Anemia questionable etiology Plan Discussed with patient and her daughter regarding rescheduling the case for tomorrow and agrees.  LOS: 6 days    Charolette Forward 01/24/2015, 1:20 PM

## 2015-01-24 NOTE — Evaluation (Addendum)
Physical Therapy Evaluation Patient Details Name: Haley Harris MRN: [id number] DOB: [birth year] Today's Date: 01/24/2015   History of Present Illness  Patient is an 81 yo female admitted 01/16/15 with SOB and abdominal pain.  CT showed obstructive mass.  Patient also with elevated troponin.  Was to have cardiac cath today - rescheduled for tomorrow (01/25/15).  PMH:  ICM, MI, CAD, HTN, COPD, CHF, compression fx's/kyphosis, DM, neuropathy, anxiety, PVD  Clinical Impression  Patient presents with problems listed below.  Will benefit from acute PT to maximize functional mobility prior to discharge.  Patient deconditioned - was independent with assistive device pta.  Recommend SNF at discharge for continued therapy prior to return home.    Follow Up Recommendations SNF;Supervision/Assistance - 24 hour    Equipment Recommendations  None recommended by PT    Recommendations for Other Services       Precautions / Restrictions Precautions Precautions: Fall Restrictions Weight Bearing Restrictions: No      Mobility  Bed Mobility               General bed mobility comments: Patient in recliner (+1 mod assist to transfer per nsg)  Transfers                 General transfer comment: Patient declined attempts at standing  Ambulation/Gait                Stairs            Wheelchair Mobility    Modified Rankin (Stroke Patients Only)       Balance                                             Pertinent Vitals/Pain Pain Assessment: 0-10 Pain Score: 5  Pain Location: Abdomen Pain Descriptors / Indicators: Aching Pain Intervention(s): Limited activity within patient's tolerance    Home Living Family/patient expects to be discharged to:: Private residence Living Arrangements: Children Available Help at Discharge: Family;Available 24 hours/day (Daughter, son-in-law (currently ill), 2 grandsons) Type of Home: House Home Access: Stairs to  enter Entrance Stairs-Rails: Right Entrance Stairs-Number of Steps: 3 Home Layout: Multi-level;Able to live on main level with bedroom/bathroom Home Equipment: Cane - single point;Transport chair;Walker - 4 wheels;Walker - 2 wheels;Shower seat      Prior Function Level of Independence: Independent with assistive device(s) (Patient using cane primarily.  Rollator for longer distances)         Comments: [employer]oc[employer] shopping and housework being done by daughter     Hand Dominance   Dominant Hand: Right    Extremity/Trunk Assessment   Upper Extremity Assessment: Generalized weakness           Lower Extremity Assessment: Generalized weakness      Cervical / Trunk Assessment: Kyphotic (h/o several compression fx's)  Communication   Communication: HOH  Cognition Arousal/Alertness: Awake/alert Behavior During Therapy: WFL for tasks assessed/performed Overall Cognitive Status: Within Functional Limits for tasks assessed                      General Comments      Exercises        Assessment/Plan    PT Assessment Patient needs continued PT services  PT Diagnosis Difficulty walking;Generalized weakness;Acute pain   PT Problem List Decreased strength;Decreased activity tolerance;Decreased balance;Decreased mobility;Decreased knowledge of use of DME;Impaired sensation;Pain  PT Treatment Interventions DME instruction;Gait training;Functional mobility training;Therapeutic activities;Therapeutic exercise;Balance training;Patient/family education   PT Goals (Current goals can be found in the Care Plan section) Acute Rehab PT Goals Patient Stated Goal: To get stronger PT Goal Formulation: With patient Time For Goal Achievement: 01/31/15 Potential to Achieve Goals: Good    Frequency Min 3X/week   Barriers to discharge        Co-evaluation               End of Session   Activity Tolerance: Patient limited by fatigue;Patient limited by pain Patient left: in  chair;with call bell/phone within reach Nurse Communication: Mobility status         Time: 1030-1314 PT Time Calculation (min) (ACUTE ONLY): 13 min   Charges:   PT Evaluation $Initial PT Evaluation Tier I: 1 Procedure     PT G CodesDespina Pole 02-Feb-2015, 8:34 PM Carita Pian. Sanjuana Kava, [place] Pager [phone]

## 2015-01-24 NOTE — Progress Notes (Signed)
PT Cancellation Note  Patient Details Name: Haley Harris MRN: [id number] DOB: Apr 17, 1929   Cancelled Treatment:    Reason Eval/Treat Not Completed: Medical issues which prohibited therapy.  Patient scheduled for cardiac cath today.  Will return at later date for PT evaluation.   Despina Pole 01/24/2015, 1:09 PM Carita Pian. Sanjuana Kava, [place] Pager [phone]

## 2015-01-24 NOTE — Care Management Important Message (Signed)
Important Message  Patient Details  Name: Haley Harris MRN: [id number] Date of Birth: 1928/07/14   Medicare Important Message Given:  Yes-third notification given    Delorse Lek 01/24/2015, 1:54 PM

## 2015-01-24 NOTE — Progress Notes (Signed)
OT Cancellation Note  Patient Details Name: Haley Harris MRN: [id number] DOB: April 28, 1929   Cancelled Treatment:    Reason Eval/Treat Not Completed: Medical issues which prohibited therapy.  Pt for cardiac catherization today.  Will proceed with eval next day.  [name] 01/24/2015, 12:03 [id number]

## 2015-01-24 NOTE — Progress Notes (Signed)
3 Days Post-Op  Subjective: Tolerated clears  Having BM's No abdominal pain  Objective: Vital signs in last 24 hours: Temp:  [97.8 F (36.6 C)-98.9 F (37.2 C)] 98.3 F (36.8 C) (07/14 0308) Pulse Rate:  [74-92] 74 (07/14 0809) Resp:  [13-20] 14 (07/14 0809) BP: (120-160)/(66-78) 137/78 mmHg (07/14 0809) SpO2:  [95 %-100 %] 97 % (07/14 0809) Weight:  [78.6 kg (173 lb 4.5 oz)] 78.6 kg (173 lb 4.5 oz) (07/14 0308) Last BM Date: 01/23/15  Intake/Output from previous day: 07/13 0701 - 07/14 0700 In: 360 [P.O.:360] Out: 2325 [Urine:2325] Intake/Output this shift:    Abdomen soft, NT/ND  Lab Results:   Recent Labs  01/23/15 0535  WBC 7.0  HGB 9.5*  HCT 28.7*  PLT 144*   BMET  Recent Labs  01/23/15 0535 01/24/15 0527  NA 138 133*  K 3.7 4.4  CL 101 97*  CO2 24 29  GLUCOSE 70 107*  BUN 9 9  CREATININE 0.93 0.87  CALCIUM 7.0* 7.1*   PT/INR No results for input(s): LABPROT, INR in the last 72 hours. ABG  Recent Labs  01/22/15 0304  PHART 7.261*  HCO3 20.9    Studies/Results: Ct Abdomen Pelvis W Contrast  01/22/2015   CLINICAL DATA:  Sigmoid obstruction, status post sigmoidoscopy possible rectal cancer.  EXAM: CT ABDOMEN AND PELVIS WITH CONTRAST  TECHNIQUE: Multidetector CT imaging of the abdomen and pelvis was performed using the standard protocol following bolus administration of intravenous contrast.  CONTRAST:  177mL OMNIPAQUE IOHEXOL 300 MG/ML  SOLN  COMPARISON:  01/18/2015, 01/19/2015  FINDINGS: Lower chest: Trace dependent pleural effusions with left greater than right compressive basilar atelectasis. Heart is enlarged. Aortic and coronary atherosclerosis noted. Mitral valve annular calcifications present. No pericardial effusion. NG tube enters the stomach with the tip noted distally.  Abdomen: Punctate small scattered hepatic hypodensities, suspect small hepatic cysts. No biliary dilatation or obstruction. Patent hepatic and portal veins. Gallbladder,  biliary system, pancreas, spleen, and left adrenal gland within normal limits for age and demonstrate no acute process.  Kidneys demonstrate mild cortical atrophy but no obstruction or hydronephrosis.  Stable enhancing left adrenal 3 cm mass which was previously evaluated by MRI on 01/19/2015. This lesion remains indeterminate for atypical adrenal adenoma versus metastasis.  No abdominal free fluid, fluid collection, hemorrhage, abscess, or adenopathy.  Extensive abdominal atherosclerosis and tortuosity. No aneurysm or occlusive process.  Oral contrast throughout the distal small bowel and colon. There is less colonic distension compatible with resolving sigmoid obstruction. No free air evident.  Pelvis: The left colon and sigmoid both demonstrate diverticular changes. There is a short segment of wall thickening of the proximal sigmoid colon, image 68 without associated inflammatory process which could be related to an underlying mural lesion versus sigmoid stricture. Please refer to the recent sigmoidoscopy.  Prior hysterectomy noted. Trace pelvic free fluid. No new pelvic collection, hemorrhage, abscess, or adenopathy. Iliac atherosclerosis noted with tortuosity. No inguinal abnormality or hernia. Urinary bladder unremarkable.  Bones are osteopenic. Postop changes of the lower lumbar spine. Chronic compression fracture at T12.  IMPRESSION: Resolving distal colonic sigmoid obstruction. Short segment of sigmoid circumferential wall thickening remains suspicious for underlying sigmoid mural lesion versus sigmoid stricture.  Extensive sigmoid diverticulosis  Stable trace pelvic free fluid  Extensive aortoiliac atherosclerosis  Small pleural effusions with compressive basilar atelectasis  Cardiomegaly with coronary calcifications  Small hepatic cysts  Stable 3 cm right adrenal mass. Please refer to the prior MRI for further characterization.  Electronically Signed   By: Jerilynn Mages.  Shick M.D.   On: 01/22/2015 18:37     Anti-infectives: Anti-infectives    None      Assessment/Plan: s/p Procedure(s): FLEXIBLE SIGMOIDOSCOPY (N/A)  Sigmoid diverticulitis  Ok to advance her diet after her procedure today. Will sign off.  Please call for any abdominal issues  LOS: 6 days    Jaramiah Bossard A 01/24/2015

## 2015-01-24 NOTE — Progress Notes (Signed)
Progress Note  Haley Harris DZH:[id number] DOB: Feb 04, 1929 DOA: 01/18/2015 PCP: [name], MD  Brief Narrative: 81 year old F Hx Anxiety, Combined Systolic and Diastolic CHF, HTN, HLD, Carotid Artery Stenosis, CAD with prior NSTEMI, Dyslipidemia, PVD associated neuropathy, COPD, GERD, Osteoporosis, and Chronic Venous insufficiency who presented to the ER c/o abdominal pain which began abruptly with swelling and discomfort. Her last BM was 2 days prior. She became short of breath after the development of the abdominal distention.   In the ER a CT of the abdomen revealed a distal colonic obstruction in the mid sigmoid colon with appearance highly suspicious for obstructing colon adenocarcinoma. There was significant colonic distention with air in the colon. There was also an incidental finding of 3.1 cm indeterminate right adrenal mass along with tiny hepatic cysts. Labs revealed hyponatremia with a sodium 128 but BUN and creatinine at baseline of 34/1.10.   Patient subsequently underwent sigmoidoscopy which actually revealed evidence for diverticulitis. Repeat CT scan shows improvement in the obstruction.  Subjective: Patient feels well. Did get somewhat dyspneic last night when she ambulated to the bathroom. Denies any chest pain. Abdomen feels well. Denies any pain. Denies nausea, vomiting. Has been passing gas.   Assessment/Plan:  Mid sigmoid colonic obstruction  -initial CT worrisome for obstructing mass/neoplasm -CEA negative -CT chest negative metastatic disease -Sigmoidoscopy 01/21/2015 noted significant stool decreasing quality of exam but no obvious mass/stricture appreciated. Diverticulitis suggested. -Follow up CT abdomen 7/12 noted resolving distal colonic sigmoid obstruction with short segment of sigmoid circumferential wall thickening and extensive sigmoid diverticulosis  -CCS has evaluated - no indication for surgical intervention thus far -Diet being advanced. Will need  outpatient follow-up with Dr. Earlean Shawl with gastroenterology. Role of antibiotics in this situation unclear. We'll discuss with GI.  Acute blood loss anemia / ?GI bleed -When she had a NG tube suction returned dark bilious b/s blood stained fluid. Hemoglobin has been stable. Continue to monitor for now.  Acute hyponatremia -Likely due to hypovolemia - corrected   Leg edema, left -LLE Doppler negative for DVT/SVT   Right adrenal mass -MRI abdomen pelvis nondiagnostic - diff includes adrenal adenoma, with adrenal metastasis considered less likely - f/u CT in 6 months suggested v/s outpt. This can be pursued further by her PCP. She may need hormonal workup.   CAD in native artery / elevated troponin -Cardiac enzymes trending down -Cardiology following. Plan is for cardiac catheterization today.   Acute exacerbation of chronic combined systolic and diastolic CHF, NYHA class 2 -most recent 2D echo with EF 40-45% -suffered acute episode of pulmonary edema night of 7/12 - quickly stabilized w/ diuresis - wean O2 as able -Cards following  -Daily weights. Good urine output noted. In negative fluid balance. Change to oral Lasix tomorrow.  Acute hypoxic respiratory failure due to pulmonary edema  -see above. Titrate off of oxygen.  CKD, stage III -Current renal function stable at baseline  Dyslipidemia -resume usual home tx   COPD -Currently compensated without evidence of wheezing or hypoxemia -Continue albuterol nebs PRN  Neuropathy due to peripheral vascular disease  DVT prophylaxis: SCD Code Status: FULL Family Communication: Daughter present at time of exam Disposition Plan: Await cardiac catheterization. PT and OT to evaluate. Possible discharge in next 24 to 48 hours.   Consultants: [facility] (Cardiology) Dr. Richmond Campbell (GI) Dr.Armando Rosendo Gros (CCS)   Procedure/Significant Events: 7/8 CT abdomen pelvis with contrast Distal colonic obstruction mid sigmoid colon,  highly suspicious for obstructing colon adenocarcinoma -Sigmoid diverticulosis -  No definite evidence of metastatic disease within the abdomen or pelvis. - 3.1 cm indeterminate right adrenal mass and probable tiny hepatic cysts.  7/9 LLE venous Doppler negative DVT/SVT 7/9 MRI abdomen W/Wo contrast -3.0 cm right adrenal mass nonspecific MR characteristics -atypical adrenal adenoma, vs adrenal metastasis -Consider PET-CT scan for further evaluation, or continued followup by CT in 6 months. 7/9 CT chest WO contrast; negative metastatic disease in the chest. -Severe compression fracture deformities at T6 and T12. Moderate compression fracture deformity at T8. 7/9 echocardiogram EF 40% to 45% - (grade 1 diastolic dysfunction).  Antibiotics: None   Objective: Blood pressure 160/76, pulse 78, temperature 98.3 F (36.8 C), temperature source Oral, resp. rate 19, height 5\' 6"  (1.676 m), weight 78.6 kg (173 lb 4.5 oz), SpO2 96 %.  Intake/Output Summary (Last 24 hours) at 01/24/15 8119 Last data filed at 01/24/15 0600  Gross per 24 hour  Intake    360 ml  Output   1925 ml  Net  -1565 ml   Exam: General: No acute respiratory distress - alert and conversant Lungs: Diminished air entry at the bases with a few crackles. No wheezing. No rhonchi. Cardiovascular: Regular rate and rhythm without murmur gallop or rub  Abdomen: Nontender, soft, bowel sounds normal, no rebound, no ascites, no appreciable mass Extremities: No significant cyanosis, clubbing, edema bilateral lower extremities  Data Reviewed: Basic Metabolic Panel:  Recent Labs Lab 01/18/15 2333 01/19/15 0434 01/21/15 0548 01/22/15 0406 01/23/15 0535 01/24/15 0527  NA  --  127* 136 138 138 133*  K  --  4.4 4.1 3.9 3.7 4.4  CL  --  97* 106 108 101 97*  CO2  --  21* 26 23 24 29   GLUCOSE  --  138* 108* 133* 70 107*  BUN  --  32* 9 7 9 9   CREATININE  --  1.17* 0.78 0.78 0.93 0.87  CALCIUM  --  7.4* 7.0* 7.1* 7.0* 7.1*  MG 2.4  --   2.3  --   --   --   PHOS 2.7  --   --   --   --   --    Liver Function Tests:  Recent Labs Lab 01/18/15 1228 01/19/15 0434 01/21/15 0548 01/22/15 0406 01/24/15 0527  AST 28 23 15 16 18   ALT 14 11* 10* 12* 12*  ALKPHOS 52 38 39 42 44  BILITOT 1.3* 0.6 0.6 0.6 0.5  PROT 7.1 5.9* 5.5* 5.8* 5.6*  ALBUMIN 4.0 3.2* 2.8* 3.1* 3.0*    Recent Labs Lab 01/18/15 1228  LIPASE 20*   CBC:  Recent Labs Lab 01/18/15 1228 01/18/15 1243 01/19/15 0434 01/20/15 0458 01/21/15 0548 01/23/15 0535  WBC 8.7  --  11.7* 7.5 6.4 7.0  NEUTROABS 7.8*  --   --   --  4.5  --   HGB 12.1 13.6 10.2* 8.9* 8.8* 9.5*  HCT 35.3* 40.0 30.5* 27.0* 27.1* 28.7*  MCV 94.6  --  96.2 98.9 100.0 98.0  PLT 238  --  187 151 149* 144*   Cardiac Enzymes:  Recent Labs Lab 01/19/15 0434 01/20/15 0013 01/20/15 2015 01/21/15 0548 01/23/15 0535  TROPONINI 0.40* 0.06* 0.07* 0.05* 0.04*   BNP (last 3 results)  Recent Labs  01/18/15 1228 01/22/15 0405 01/23/15 0535  BNP 1610.0* 1857.1* 990.7*    ProBNP (last 3 results)  Recent Labs  03/01/14 1930  PROBNP 2785.0*    CBG:  Recent Labs Lab 01/18/15 2033 01/18/15 2352 01/19/15 0450  GLUCAP 127* 126* 131*    Recent Results (from the past 240 hour(s))  MRSA PCR Screening     Status: None   Collection Time: 01/18/15  6:58 PM  Result Value Ref Range Status   MRSA by PCR NEGATIVE NEGATIVE Final    Comment:        The GeneXpert MRSA Assay (FDA approved for NASAL specimens only), is one component of a comprehensive MRSA colonization surveillance program. It is not intended to diagnose MRSA infection nor to guide or monitor treatment for MRSA infections.     Studies:  Recent x-ray studies have been reviewed in detail by the Attending Physician  Scheduled Meds:  Scheduled Meds: . aspirin  81 mg Oral Pre-Cath  . aspirin EC  81 mg Oral Daily  . carvedilol  3.125 mg Oral BID WC  . folic acid  1 mg Oral Daily  . furosemide  40 mg  Intravenous Daily  . potassium chloride  40 mEq Oral Daily  . pravastatin  80 mg Oral q1800  . sodium chloride  3 mL Intravenous Q12H  . thiamine  100 mg Oral Daily    Time spent on care of this patient: 35 mins  [place] - [phone] Office  [phone]  Contact MD directly via text page:      [email or web address]      password [facility]  01/24/2015, 8:07 AM   LOS: 6 days

## 2015-01-24 NOTE — Progress Notes (Signed)
Peri area tender, pink and swollen. Patient complained of pain during insertion of catheter. Stated that it is sore in the peri area because of all the lasix and urinating when she was first admitted.

## 2015-01-25 ENCOUNTER — Encounter (HOSPITAL_COMMUNITY)
Admission: EM | Disposition: A | Discharge status: Skilled Nursing Facility | Payer: Medicare Other | Source: Home / Self Care | Attending: Internal Medicine

## 2015-01-25 ENCOUNTER — Encounter (HOSPITAL_COMMUNITY): Payer: Self-pay | Admitting: Cardiology

## 2015-01-25 HISTORY — PX: CARDIAC CATHETERIZATION: SHX172

## 2015-01-25 LAB — BASIC METABOLIC PANEL
Anion gap: 9 (ref 5–15)
BUN: 10 mg/dL (ref 6–20)
CALCIUM: 7.3 mg/dL — AB (ref 8.9–10.3)
CHLORIDE: 97 mmol/L — AB (ref 101–111)
CO2: 26 mmol/L (ref 22–32)
CREATININE: 0.91 mg/dL (ref 0.44–1.00)
GFR, EST NON AFRICAN AMERICAN: 57 mL/min — AB (ref >60)
GLUCOSE: 110 mg/dL — AB (ref 65–99)
POTASSIUM: 4.1 mmol/L (ref 3.5–5.1)
Sodium: 132 mmol/L — ABNORMAL LOW (ref 135–145)

## 2015-01-25 LAB — CBC
HCT: 29.8 % — ABNORMAL LOW (ref 36.0–46.0)
Hemoglobin: 10.1 g/dL — ABNORMAL LOW (ref 12.0–15.0)
MCH: 32.9 pg (ref 26.0–34.0)
MCHC: 33.9 g/dL (ref 30.0–36.0)
MCV: 97.1 fL (ref 78.0–100.0)
PLATELETS: 147 10*3/uL — AB (ref 150–400)
RBC: 3.07 MIL/uL — ABNORMAL LOW (ref 3.87–5.11)
RDW: 12.6 % (ref 11.5–15.5)
WBC: 8.3 10*3/uL (ref 4.0–10.5)

## 2015-01-25 SURGERY — LEFT HEART CATH AND CORONARY ANGIOGRAPHY

## 2015-01-25 MED ORDER — LIDOCAINE HCL (PF) 1 % IJ SOLN
INTRAMUSCULAR | Status: AC
  Filled 2015-01-25: qty 30

## 2015-01-25 MED ORDER — SODIUM CHLORIDE 0.9 % WEIGHT BASED INFUSION: 1.0000 mL/kg/h | INTRAVENOUS | Status: AC

## 2015-01-25 MED ORDER — GUAIFENESIN-DM 100-10 MG/5ML PO SYRP: 5.0000 mL | ORAL_SOLUTION | ORAL | Status: DC | PRN

## 2015-01-25 MED ORDER — IOHEXOL 300 MG/ML  SOLN
INTRAMUSCULAR | Status: DC | PRN
  Administered 2015-01-25: 80 mL via INTRA_ARTERIAL

## 2015-01-25 MED ORDER — FLUTICASONE PROPIONATE 50 MCG/ACT NA SUSP
2.0000 | Freq: Every day | NASAL | Status: DC
  Administered 2015-01-25 – 2015-01-28 (×4): 2 via NASAL
  Filled 2015-01-25: qty 16

## 2015-01-25 MED ORDER — ONDANSETRON HCL 4 MG/2ML IJ SOLN: 4.0000 mg | Freq: Four times a day (QID) | INTRAMUSCULAR | Status: DC | PRN

## 2015-01-25 MED ORDER — CIPROFLOXACIN HCL 500 MG PO TABS
500.0000 mg | ORAL_TABLET | Freq: Two times a day (BID) | ORAL | Status: DC
  Administered 2015-01-25 – 2015-01-28 (×7): 500 mg via ORAL
  Filled 2015-01-25 (×9): qty 1

## 2015-01-25 MED ORDER — SODIUM CHLORIDE 0.9 % IJ SOLN
3.0000 mL | Freq: Two times a day (BID) | INTRAMUSCULAR | Status: DC
  Administered 2015-01-25 – 2015-01-28 (×5): 3 mL via INTRAVENOUS

## 2015-01-25 MED ORDER — FENTANYL CITRATE (PF) 100 MCG/2ML IJ SOLN
INTRAMUSCULAR | Status: AC
  Filled 2015-01-25: qty 2

## 2015-01-25 MED ORDER — SODIUM CHLORIDE 0.9 % IJ SOLN: 3.0000 mL | INTRAMUSCULAR | Status: DC | PRN

## 2015-01-25 MED ORDER — ACETAMINOPHEN 325 MG PO TABS
650.0000 mg | ORAL_TABLET | ORAL | Status: DC | PRN
  Administered 2015-01-25 – 2015-01-27 (×4): 650 mg via ORAL
  Filled 2015-01-25 (×4): qty 2

## 2015-01-25 MED ORDER — SODIUM CHLORIDE 0.9 % IV SOLN: 250.0000 mL | INTRAVENOUS | Status: DC | PRN

## 2015-01-25 MED ORDER — NITROGLYCERIN 1 MG/10 ML FOR IR/CATH LAB
INTRA_ARTERIAL | Status: AC
  Filled 2015-01-25: qty 10

## 2015-01-25 MED ORDER — ASPIRIN 81 MG PO CHEW: 81.0000 mg | CHEWABLE_TABLET | Freq: Every day | ORAL | Status: DC

## 2015-01-25 MED ORDER — FENTANYL CITRATE (PF) 100 MCG/2ML IJ SOLN
INTRAMUSCULAR | Status: DC | PRN
  Administered 2015-01-25 (×2): 25 ug via INTRAVENOUS

## 2015-01-25 MED ORDER — LIDOCAINE HCL (PF) 1 % IJ SOLN
INTRAMUSCULAR | Status: DC | PRN
  Administered 2015-01-25: 08:00:00

## 2015-01-25 MED ORDER — MONTELUKAST SODIUM 10 MG PO TABS
10.0000 mg | ORAL_TABLET | Freq: Every day | ORAL | Status: DC
  Administered 2015-01-25 – 2015-01-27 (×3): 10 mg via ORAL
  Filled 2015-01-25 (×4): qty 1

## 2015-01-25 MED ORDER — LORATADINE 10 MG PO TABS
10.0000 mg | ORAL_TABLET | Freq: Every day | ORAL | Status: DC
  Administered 2015-01-25 – 2015-01-28 (×4): 10 mg via ORAL
  Filled 2015-01-25 (×4): qty 1

## 2015-01-25 MED ORDER — HEPARIN (PORCINE) IN NACL 2-0.9 UNIT/ML-% IJ SOLN
INTRAMUSCULAR | Status: AC
  Filled 2015-01-25: qty 1000

## 2015-01-25 MED ORDER — FUROSEMIDE 10 MG/ML IJ SOLN
40.0000 mg | Freq: Once | INTRAMUSCULAR | Status: AC
  Administered 2015-01-25: 20:00:00 40 mg via INTRAVENOUS
  Filled 2015-01-25: qty 4

## 2015-01-25 SURGICAL SUPPLY — 8 items
CATH INFINITI 5FR JL5 (CATHETERS) ×2 IMPLANT
CATH INFINITI 5FR MULTPACK ANG (CATHETERS) ×3 IMPLANT
KIT HEART LEFT (KITS) ×3 IMPLANT
PACK CARDIAC CATHETERIZATION (CUSTOM PROCEDURE TRAY) ×3 IMPLANT
SHEATH PINNACLE 5F 10CM (SHEATH) ×3 IMPLANT
SYR MEDRAD MARK V 150ML (SYRINGE) ×3 IMPLANT
TRANSDUCER W/STOPCOCK (MISCELLANEOUS) ×3 IMPLANT
WIRE EMERALD 3MM-J .035X150CM (WIRE) ×3 IMPLANT

## 2015-01-25 NOTE — Clinical Social Work Note (Signed)
Clinical Social Work Assessment  Patient Details  Name: Haley Harris MRN: [id number] Date of Birth: September 12, 1928  Date of referral:  01/25/15               Reason for consult:  Facility Placement                Permission sought to share information with:  Family Supports (Daughter [place] in the room with patient) Permission granted to share information::  Yes, Verbal Permission Granted  Name::     [name] (daughter), Quinteria Chisum (daughter) and Journii Nierman (son).  Agency::     Relationship::     Contact Information:  [name] [phone]), Dickey Gave [phone]) and Elayne Snare [phone])  Housing/Transportation Living arrangements for the past 2 months:  [place] of Information:  Patient, Adult Children Patient Interpreter Needed:  None Criminal Activity/Legal Involvement Pertinent to Current Situation/Hospitalization:  No - Comment as needed Significant Relationships:  Adult Children, Other Family Members Lives with:  Adult Children (Patient lives with daughter Haley Harris) Do you feel safe going back to the place where you live?  Yes Need for family participation in patient care:  Yes (Comment)  Care giving concerns:  None expressed by patient or daughter Haley Harris.   Social Worker assessment / plan:  CSW talked with patient and daughter Haley Harris at the bedside regarding discharge planning. Patient aware of SNF recommendation of short-term rehab and wants to return to [facility] where she has been for rehab before. Patient reported that she had a good experience at the facility. Daughter provided with [facility] skilled facility list.    Employment status:  Retired Nurse, adult PT Recommendations:  [place] / Referral to community resources:  Other (Comment Required) (Not requested or needed at this time.)  Patient/Family's Response to care:  No concerns expressed by patient or daughter  regarding care  Patient/Family's Understanding of and Emotional Response to Diagnosis, Current Treatment, and Prognosis:  Not discussed.  Emotional Assessment Appearance:  Appears stated age Attitude/Demeanor/Rapport:  Other (Attitude appropriate) Affect (typically observed):  Appropriate, Accepting, Pleasant Orientation:  Oriented to Self, Oriented to Place, Oriented to  Time, Oriented to Situation Alcohol / Substance use:  Tobacco Use, Alcohol Use (Patient quit smoking in 1976. Patient will typically drink a glass of wine or a mixed beverage daily.) Psych involvement (Current and /or in the community):  No (Comment)  Discharge Needs  Concerns to be addressed:  Discharge Planning Concerns Readmission within the last 30 days:  No Current discharge risk:  None Barriers to Discharge:  No Barriers Identified   Sable Feil, LCSW 01/25/2015, 2:13 PM

## 2015-01-25 NOTE — Progress Notes (Signed)
Subjective:  Doing well denies any chest pain or shortness of breath. Tolerated cardiac cath this morning noted to have patent stent in LAD and RCA.  Objective:  Vital Signs in the last 24 hours: Temp:  [97.6 F (36.4 C)-98.4 F (36.9 C)] 98.4 F (36.9 C) (07/15 1500) Pulse Rate:  [39-98] 94 (07/15 1631) Resp:  [6-22] 22 (07/15 1500) BP: (98-169)/(57-93) 126/68 mmHg (07/15 1500) SpO2:  [92 %-99 %] 96 % (07/15 1631) Weight:  [76.1 kg (167 lb 12.3 oz)] 76.1 kg (167 lb 12.3 oz) (07/15 0356)  Intake/Output from previous day: 07/14 0701 - 07/15 0700 In: 2370.3 [P.O.:240; I.V.:130.3] Out: 402 [Urine:402] Intake/Output from this shift: Total I/O In: -  Out: 1900 [Urine:1900]  Physical Exam: Neck: no adenopathy, no carotid bruit, no JVD and supple, symmetrical, trachea midline Lungs: clear to auscultation bilaterally Heart: regular rate and rhythm, S1, S2 normal and Soft systolic murmur noted Abdomen: soft, non-tender; bowel sounds normal; no masses,  no organomegaly Extremities: extremities normal, atraumatic, no cyanosis or edema and Right groin stable.  Lab Results:  Recent Labs  01/23/15 0535 01/25/15 0554  WBC 7.0 8.3  HGB 9.5* 10.1*  PLT 144* 147*    Recent Labs  01/24/15 0527 01/25/15 0554  NA 133* 132*  K 4.4 4.1  CL 97* 97*  CO2 29 26  GLUCOSE 107* 110*  BUN 9 10  CREATININE 0.87 0.91    Recent Labs  01/23/15 0535  TROPONINI 0.04*   Hepatic Function Panel  Recent Labs  01/24/15 0527  PROT 5.6*  ALBUMIN 3.0*  AST 18  ALT 12*  ALKPHOS 44  BILITOT 0.5   No results for input(s): CHOL in the last 72 hours. No results for input(s): PROTIME in the last 72 hours.  Imaging: Imaging results have been reviewed and No results found.  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 Ischemic cardiomyopathy. Hypertension. Hypercholesterolemia. COPD History of cerebrovascular disease. History of CVA of breast. Spinal stenosis. Vitamin D deficiency. Osteoporosis. History of compression fracture of thoracic spine with kyphosis Anemia questionable etiology Plan Continue present management. Okay to stop Plavix Okay to discharge from cardiac point of view I will sign off please call if needed Follow-up with me in 2 weeks  LOS: 7 days    Charolette Forward 01/25/2015, 5:36 PM

## 2015-01-25 NOTE — Interval H&P Note (Signed)
Cath Lab Visit (complete for each Cath Lab visit)  Clinical Evaluation Leading to the Procedure:   ACS: Yes.    Non-ACS:    Anginal Classification: CCS IV  Anti-ischemic medical therapy: Maximal Therapy (2 or more classes of medications)  Non-Invasive Test Results: No non-invasive testing performed  Prior CABG: No previous CABG      History and Physical Interval Note:  01/25/2015 7:32 AM  Haley Harris  has presented today for surgery, with the diagnosis of cad  The various methods of treatment have been discussed with the patient and family. After consideration of risks, benefits and other options for treatment, the patient has consented to  Procedure(s): Left Heart Cath and Coronary Angiography (N/A) as a surgical intervention .  The patient's history has been reviewed, patient examined, no change in status, stable for surgery.  I have reviewed the patient's chart and labs.  Questions were answered to the patient's satisfaction.     Charolette Forward

## 2015-01-25 NOTE — Evaluation (Signed)
Occupational Therapy Evaluation Patient Details Name: Haley Harris MRN: [id number] DOB: Sep 11, 1928 Today's Date: 01/25/2015    History of Present Illness Patient is an 81 yo female admitted 01/16/15 with SOB and abdominal pain.  CT showed obstructive mass.  Patient also with elevated troponin.  Was to have cardiac cath today - rescheduled for tomorrow (01/25/15).  PMH:  ICM, MI, CAD, HTN, COPD, CHF, compression fx's/kyphosis, DM, neuropathy, anxiety, PVD   Clinical Impression   Pt currently needs min to mod assist for selfcare tasks sit to stand and for functional transfers.  Prior to admission she was able to perform all tasks with modified independence.  Feel she will benefit from acute care rehab to start progression back to baseline function but will also likely need SNF for further rehab as well.      Follow Up Recommendations  SNF;Supervision/Assistance - 24 hour    Equipment Recommendations  None recommended by OT       Precautions / Restrictions Precautions Precautions: Fall Restrictions Weight Bearing Restrictions: No      Mobility Bed Mobility Overal bed mobility: Needs Assistance Bed Mobility: Supine to Sit     Supine to sit: Min assist     General bed mobility comments: Patient in recliner as PT entered room  Transfers Overall transfer level: Needs assistance Equipment used: Rolling walker (2 wheeled) Transfers: Sit to/from Stand Sit to Stand: Min assist         General transfer comment: Pt needs min assist for sit to stand with min instructional cueing for hand placement.     Balance Overall balance assessment: Needs assistance   Sitting balance-Leahy Scale: Normal       Standing balance-Leahy Scale: Fair Standing balance comment: Pt was able to release her UEs from the walker during grooming tasks.                             ADL Overall ADL's : Needs assistance/impaired Eating/Feeding: Independent;Sitting   Grooming: Wash/dry  hands;Wash/dry face;Minimal assistance   Upper Body Bathing: Supervision/ safety;Sitting   Lower Body Bathing: Minimal assistance;Sit to/from stand   Upper Body Dressing : Supervision/safety;Sitting   Lower Body Dressing: Moderate assistance;Sit to/from stand   Toilet Transfer: Minimal assistance;BSC;Ambulation;RW   Toileting- Clothing Manipulation and Hygiene: Minimal assistance;Sit to/from stand       Functional mobility during ADLs: Minimal assistance General ADL Comments: Pt able to maintain standing at the sink for greater than five mins during grooming tasks with min assist.  Needed one rest break after this before ambulating to the bedside chair.  Pt and famly report use of AE for LB selfcare prior to this admission as well as DME for the tub and for over the toilet.       Vision Vision Assessment?: No apparent visual deficits   Perception Perception Perception Tested?: No   Praxis Praxis Praxis tested?: Not tested    Pertinent Vitals/Pain Pain Assessment: No/denies pain     Hand Dominance Right   Extremity/Trunk Assessment Upper Extremity Assessment Upper Extremity Assessment: Generalized weakness   Lower Extremity Assessment Lower Extremity Assessment: Defer to PT evaluation       Communication Communication Communication: HOH   Cognition Arousal/Alertness: Awake/alert Behavior During Therapy: WFL for tasks assessed/performed Overall Cognitive Status: Within Functional Limits for tasks assessed  Home Living Family/patient expects to be discharged to:: Private residence Living Arrangements: Children Available Help at Discharge: Family;Available 24 hours/day (Daughter, son-in-law (currently ill), 2 grandsons) Type of Home: House Home Access: Stairs to enter [employer] of Steps: 3 Entrance Stairs-Rails: Right Home Layout: Multi-level;Able to live on main level with bedroom/bathroom     Bathroom  Shower/Tub: Hospital doctor Toilet: Handicapped height     Home Equipment: [place] - single Tax inspector - 4 wheels;Walker - 2 wheels;Shower seat          Prior Functioning/Environment Level of Independence: Independent with assistive device(s) (Patient using cane primarily.  Rollator for longer distances)        Comments: [place] shopping and housework being done by daughter    OT Diagnosis: Generalized weakness   OT Problem List: Decreased strength;Decreased activity tolerance;Impaired balance (sitting and/or standing);Decreased knowledge of use of DME or AE   OT Treatment/Interventions: Self-care/ADL training;Balance training;Therapeutic activities;Energy conservation;DME and/or AE instruction    OT Goals(Current goals can be found in the care plan section) Acute Rehab OT Goals Patient Stated Goal: Pt wants to get back to PLOF. OT Goal Formulation: With patient Time For Goal Achievement: 01/25/15 Potential to Achieve Goals: Good  OT Frequency: Min 2X/week              End of Session Equipment Utilized During Treatment: Gait belt;Rolling walker Nurse Communication: Mobility status  Activity Tolerance: Patient tolerated treatment well Patient left: in chair;with call bell/phone within reach;with family/visitor present   Time: 9030-0923 OT Time Calculation (min): 31 min Charges:  OT General Charges $OT Visit: 1 Procedure OT Evaluation $Initial OT Evaluation Tier I: 1 Procedure OT Treatments $Self Care/Home Management : 8-22 mins  Sahej Schrieber OTR/L 01/25/2015, 4:44 PM

## 2015-01-25 NOTE — Clinical Social Work Placement (Addendum)
   CLINICAL SOCIAL WORK PLACEMENT  NOTE  Date:  01/25/2015  Patient Details  Name: ICELYN NAVARRETE MRN: [id number] Date of Birth: [birth year]  Clinical Social Work is seeking post-discharge placement for this patient at the [place] level of care (*CSW will initial, date and re-position this form in  chart as items are completed):  Yes   Patient/family provided with [place] Work Department's list of facilities offering this level of care within the geographic area requested by the patient (or if unable, by the patient's family).  Yes   Patient/family informed of their freedom to choose among providers that offer the needed level of care, that participate in Medicare, Medicaid or managed care program needed by the patient, have an available bed and are willing to accept the patient.  Yes   Patient/family informed of [facility]'s ownership interest in [facility] and [facility], as well as of the fact that they are under no obligation to receive care at these facilities.  PASRR submitted to EDS on       PASRR number received on       Existing PASRR number confirmed on 01/25/15     FL2 transmitted to all facilities in geographic area requested by pt/family on 01/25/15     FL2 transmitted to all facilities within larger geographic area on       Patient informed that his/her managed care company has contracts with or will negotiate with certain facilities, including the following:         01/25/15 - Patient/family informed of bed offers received - SEE COMMENT SECTION BELOW.  Patient chooses bed at       Physician recommends and patient chooses bed at      Patient to be transferred to   on  .  Patient to be transferred to facility by       Patient family notified on   of transfer.  Name of family member notified:        PHYSICIAN       Additional Comment:  01/25/15 - Patient's preference is [facility] CSW received call from Wendell,  admissions Mudlogger at [facility] with questions regarding patient. CSW contacted Angela Nevin and left message with answers to questions. Miquel Dunn responded considering in [place]. CSW talked with MD and was advised that patient may d/c over weekend.   _______________________________________________ Sable Feil, LCSW 01/25/2015, 2:24 PM

## 2015-01-25 NOTE — H&P (View-Only) (Signed)
Subjective:  Denies any chest pain states had mild shortness of breath earlier this morning. Abdominal pain resolved. Cardiac catheterization has been rescheduled for tomorrow morning due to delay in the procedure and long complex case earlier.  Objective:  Vital Signs in the last 24 hours: Temp:  [97.9 F (36.6 C)-98.9 F (37.2 C)] 98.3 F (36.8 C) (07/14 0308) Pulse Rate:  [74-79] 74 (07/14 0809) Resp:  [13-20] 17 (07/14 1300) BP: (123-160)/(66-78) 133/73 mmHg (07/14 1300) SpO2:  [95 %-100 %] 97 % (07/14 0809) Weight:  [78.6 kg (173 lb 4.5 oz)] 78.6 kg (173 lb 4.5 oz) (07/14 0308)  Intake/Output from previous day: 07/13 0701 - 07/14 0700 In: 360 [P.O.:360] Out: 2325 [Urine:2325] Intake/Output from this shift:    Physical Exam: Neck: no adenopathy, no carotid bruit, no JVD and supple, symmetrical, trachea midline Lungs: Decreased breath sound at bases Heart: regular rate and rhythm, S1, S2 normal and Soft systolic murmur noted Abdomen: soft, non-tender; bowel sounds normal; no masses,  no organomegaly Extremities: extremities normal, atraumatic, no cyanosis or edema  Lab Results:  Recent Labs  01/23/15 0535  WBC 7.0  HGB 9.5*  PLT 144*    Recent Labs  01/23/15 0535 01/24/15 0527  NA 138 133*  K 3.7 4.4  CL 101 97*  CO2 24 29  GLUCOSE 70 107*  BUN 9 9  CREATININE 0.93 0.87    Recent Labs  01/23/15 0535  TROPONINI 0.04*   Hepatic Function Panel  Recent Labs  01/24/15 0527  PROT 5.6*  ALBUMIN 3.0*  AST 18  ALT 12*  ALKPHOS 44  BILITOT 0.5   No results for input(s): CHOL in the last 72 hours. No results for input(s): PROTIME in the last 72 hours.  Imaging: Imaging results have been reviewed and No results found.  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 Ischemic cardiomyopathy. Hypertension. Hypercholesterolemia. COPD History of cerebrovascular disease. History of CVA of breast. Spinal stenosis. Vitamin D deficiency. Osteoporosis. History of compression fracture of thoracic spine with kyphosis Anemia questionable etiology Plan Discussed with patient and her daughter regarding rescheduling the case for tomorrow and agrees.  LOS: 6 days    Charolette Forward 01/24/2015, 1:20 PM

## 2015-01-25 NOTE — Progress Notes (Signed)
Physical Therapy Treatment Patient Details Name: Haley Harris MRN: [id number] DOB: 1929/01/23 Today's Date: 01/25/2015    History of Present Illness Patient is an 81 yo female admitted 01/16/15 with SOB and abdominal pain.  CT showed obstructive mass.  Patient also with elevated troponin.  Was to have cardiac cath today - rescheduled for tomorrow (01/25/15).  PMH:  ICM, MI, CAD, HTN, COPD, CHF, compression fx's/kyphosis, DM, neuropathy, anxiety, PVD    PT Comments    Patient making slow gains with mobility and gait.  Fatigues quickly and requires assist for balance/safety.  Continue to recommend SNF at discharge prior to return home.  Follow Up Recommendations  SNF;Supervision/Assistance - 24 hour     Equipment Recommendations  None recommended by PT    Recommendations for Other Services       Precautions / Restrictions Precautions Precautions: Fall Restrictions Weight Bearing Restrictions: No    Mobility  Bed Mobility               General bed mobility comments: Patient in recliner as PT entered room  Transfers Overall transfer level: Needs assistance Equipment used: Rolling walker (2 wheeled) Transfers: Sit to/from Stand Sit to Stand: Min assist         General transfer comment: Verbal cues for hand placement.  Assist to steady during transfers.  Ambulation/Gait Ambulation/Gait assistance: Min assist Ambulation Distance (Feet): 30 Feet Assistive device: Rolling walker (2 wheeled) Gait Pattern/deviations: Step-through pattern;Decreased stride length;Shuffle;Trunk flexed Gait velocity: Decreased Gait velocity interpretation: Below normal speed for age/gender General Gait Details: Verbal cues for safe use of RW and to stand upright during gait.  Patient fatigued quickly - at 30'.  Patient taking short, shuffling steps.   Stairs            Wheelchair Mobility    Modified Rankin (Stroke Patients Only)       Balance                                    Cognition Arousal/Alertness: Awake/alert Behavior During Therapy: WFL for tasks assessed/performed Overall Cognitive Status: Within Functional Limits for tasks assessed                      Exercises      General Comments        Pertinent Vitals/Pain Pain Assessment: No/denies pain    Home Living Family/patient expects to be discharged to:: Private residence Living Arrangements: Children Available Help at Discharge: Family;Available 24 hours/day (Daughter, son-in-law (currently ill), 2 grandsons) Type of Home: House Home Access: Stairs to enter Entrance Stairs-Rails: Right Home Layout: Multi-level;Able to live on main level with bedroom/bathroom Home Equipment: Cane - single point;Transport chair;Walker - 4 wheels;Walker - 2 wheels;Shower seat      Prior Function Level of Independence: Independent with assistive device(s) (Patient using cane primarily.  Rollator for longer distances)      Comments: [employer] shopping and housework being done by daughter   PT Goals (current goals can now be found in the care plan section) Progress towards PT goals: Progressing toward goals    Frequency  Min 3X/week    PT Plan Current plan remains appropriate    Co-evaluation             End of Session Equipment Utilized During Treatment: Gait belt Activity Tolerance: Patient limited by fatigue Patient left: in chair;with call bell/phone within reach;with family/visitor  present     Time: 1521-1531 PT Time Calculation (min) (ACUTE ONLY): 10 min  Charges:  $Gait Training: 8-22 mins                    G Codes:      Despina Pole February 12, 2015, 3:45 PM Carita Pian. Sanjuana Kava, [place] Pager [phone]

## 2015-01-25 NOTE — Progress Notes (Signed)
Progress Note  Haley Harris HYI:[id number] DOB: 1929-06-20 DOA: 01/18/2015 PCP: [name], MD  Brief Narrative: 81 year old F Hx Anxiety, Combined Systolic and Diastolic CHF, HTN, HLD, Carotid Artery Stenosis, CAD with prior NSTEMI, Dyslipidemia, PVD associated neuropathy, COPD, GERD, Osteoporosis, and Chronic Venous insufficiency who presented to the ER c/o abdominal pain which began abruptly with swelling and discomfort. Her last BM was 2 days prior. She became short of breath after the development of the abdominal distention.   In the ER a CT of the abdomen revealed a distal colonic obstruction in the mid sigmoid colon with appearance highly suspicious for obstructing colon adenocarcinoma. There was significant colonic distention with air in the colon. There was also an incidental finding of 3.1 cm indeterminate right adrenal mass along with tiny hepatic cysts. Labs revealed hyponatremia with a sodium 128 but BUN and creatinine at baseline of 34/1.10.   Patient subsequently underwent sigmoidoscopy which actually revealed evidence for diverticulitis. Repeat CT scan shows improvement in the obstruction.  Subjective: Patient feels well. Did get somewhat dyspneic last night when she ambulated to the bathroom. Denies any chest pain. Abdomen feels well. Denies any pain. Denies nausea, vomiting. Has been passing gas.   Assessment/Plan:  Mid sigmoid colonic obstruction  -initial CT was worrisome for obstructing mass/neoplasm -CEA was negative -CT chest negative for metastatic disease -Sigmoidoscopy 01/21/2015 noted significant stool decreasing quality of exam but no obvious mass/stricture appreciated. Diverticulitis suggested. -Follow up CT abdomen 7/12 noted resolving distal colonic sigmoid obstruction with short segment of sigmoid circumferential wall thickening and extensive sigmoid diverticulosis  -CCS has evaluated - no indication for surgical intervention thus far -Diet has been  advanced. Discussed with Dr. Earlean Shawl with gastroenterology. He recommends oral ciprofloxacin. He would like to follow-up with this patient in his office in 1-2 weeks.   Acute blood loss anemia / ?GI bleed -When she had a NG tube suction returned dark bilious b/s blood stained fluid. Hemoglobin has been stable. Continue to monitor for now.  Acute hyponatremia -Likely due to hypovolemia - corrected   Leg edema, left -LLE Doppler negative for DVT/SVT   Right adrenal mass -MRI abdomen pelvis nondiagnostic - diff includes adrenal adenoma, with adrenal metastasis considered less likely - f/u CT in 6 months suggested v/s outpt. This can be pursued further by her PCP. She may need hormonal workup.   CAD in native artery / elevated troponin -Cardiac enzymes trending down -Cardiology following. Cardiac catheterization could not be done yesterday. It'll be done today.   Acute exacerbation of chronic combined systolic and diastolic CHF, NYHA class 2 -most recent 2D echo with EF 40-45% -suffered acute episode of pulmonary edema night of 7/12 - quickly stabilized w/ diuresis - wean O2 as able -Cards following  -Daily weights. Good urine output noted. In negative fluid balance. Continue IV Lasix for today as well. Repeat chest x-ray tomorrow.  Acute hypoxic respiratory failure due to pulmonary edema  -see above. Titrate off of oxygen.  CKD, stage III -Current renal function stable at baseline  Dyslipidemia Stable  COPD -Currently compensated without evidence of wheezing or hypoxemia -Continue albuterol nebs PRN  Neuropathy due to peripheral vascular disease  DVT prophylaxis: SCD Code Status: FULL Family Communication: His constipation. Discussed with the daughter yesterday. Disposition Plan: Await cardiac catheterization. PT and OT recommends SNF. Consult Education officer, museum. Continue IV Lasix. Repeat chest x-ray tomorrow.   Consultants: Dr.Mohan [facility] (Cardiology) Dr. Richmond Campbell  (GI) Dr.Armando Rosendo Gros (CCS)   Procedure/Significant Events: 7/8  CT abdomen pelvis with contrast Distal colonic obstruction mid sigmoid colon, highly suspicious for obstructing colon adenocarcinoma -Sigmoid diverticulosis -No definite evidence of metastatic disease within the abdomen or pelvis. - 3.1 cm indeterminate right adrenal mass and probable tiny hepatic cysts.  7/9 LLE venous Doppler negative DVT/SVT 7/9 MRI abdomen W/Wo contrast -3.0 cm right adrenal mass nonspecific MR characteristics -atypical adrenal adenoma, vs adrenal metastasis -Consider PET-CT scan for further evaluation, or continued followup by CT in 6 months. 7/9 CT chest WO contrast; negative metastatic disease in the chest. -Severe compression fracture deformities at T6 and T12. Moderate compression fracture deformity at T8. 7/9 echocardiogram EF 40% to 45% - (grade 1 diastolic dysfunction).  Antibiotics: None   Objective: Blood pressure 135/71, pulse 69, temperature 98.1 F (36.7 C), temperature source Oral, resp. rate 16, height 5\' 6"  (1.676 m), weight 76.1 kg (167 lb 12.3 oz), SpO2 92 %.  Intake/Output Summary (Last 24 hours) at 01/25/15 1043 Last data filed at 01/25/15 0600  Gross per 24 hour  Intake 2370.33 ml  Output    402 ml  Net 1968.33 ml   Exam: General: No distress - alert and conversant Lungs: Diminished air entry at the bases with a few crackles. No wheezing. No rhonchi. Cardiovascular: Regular rate and rhythm without murmur gallop or rub  Abdomen: Nontender, soft, bowel sounds normal, no rebound, no ascites, no appreciable mass Extremities: No significant cyanosis, clubbing, edema bilateral lower extremities  Data Reviewed: Basic Metabolic Panel:  Recent Labs Lab 01/18/15 2333  01/21/15 0548 01/22/15 0406 01/23/15 0535 01/24/15 0527 01/25/15 0554  NA  --   < > 136 138 138 133* 132*  K  --   < > 4.1 3.9 3.7 4.4 4.1  CL  --   < > 106 108 101 97* 97*  CO2  --   < > 26 23 24 29 26    GLUCOSE  --   < > 108* 133* 70 107* 110*  BUN  --   < > 9 7 9 9 10   CREATININE  --   < > 0.78 0.78 0.93 0.87 0.91  CALCIUM  --   < > 7.0* 7.1* 7.0* 7.1* 7.3*  MG 2.4  --  2.3  --   --   --   --   PHOS 2.7  --   --   --   --   --   --   < > = values in this interval not displayed. Liver Function Tests:  Recent Labs Lab 01/18/15 1228 01/19/15 0434 01/21/15 0548 01/22/15 0406 01/24/15 0527  AST 28 23 15 16 18   ALT 14 11* 10* 12* 12*  ALKPHOS 52 38 39 42 44  BILITOT 1.3* 0.6 0.6 0.6 0.5  PROT 7.1 5.9* 5.5* 5.8* 5.6*  ALBUMIN 4.0 3.2* 2.8* 3.1* 3.0*    Recent Labs Lab 01/18/15 1228  LIPASE 20*   CBC:  Recent Labs Lab 01/18/15 1228  01/19/15 0434 01/20/15 0458 01/21/15 0548 01/23/15 0535 01/25/15 0554  WBC 8.7  --  11.7* 7.5 6.4 7.0 8.3  NEUTROABS 7.8*  --   --   --  4.5  --   --   HGB 12.1  < > 10.2* 8.9* 8.8* 9.5* 10.1*  HCT 35.3*  < > 30.5* 27.0* 27.1* 28.7* 29.8*  MCV 94.6  --  96.2 98.9 100.0 98.0 97.1  PLT 238  --  187 151 149* 144* 147*  < > = values in this interval not displayed.  Cardiac Enzymes:  Recent Labs Lab 01/19/15 0434 01/20/15 0013 01/20/15 2015 01/21/15 0548 01/23/15 0535  TROPONINI 0.40* 0.06* 0.07* 0.05* 0.04*   BNP (last 3 results)  Recent Labs  01/18/15 1228 01/22/15 0405 01/23/15 0535  BNP 1610.0* 1857.1* 990.7*    ProBNP (last 3 results)  Recent Labs  03/01/14 1930  PROBNP 2785.0*    CBG:  Recent Labs Lab 01/18/15 2033 01/18/15 2352 01/19/15 0450  GLUCAP 127* 126* 131*    Recent Results (from the past 240 hour(s))  MRSA PCR Screening     Status: None   Collection Time: 01/18/15  6:58 PM  Result Value Ref Range Status   MRSA by PCR NEGATIVE NEGATIVE Final    Comment:        The GeneXpert MRSA Assay (FDA approved for NASAL specimens only), is one component of a comprehensive MRSA colonization surveillance program. It is not intended to diagnose MRSA infection nor to guide or monitor treatment  for MRSA infections.       Scheduled Meds:  Scheduled Meds: . aspirin  81 mg Oral Daily  . aspirin EC  81 mg Oral Daily  . carvedilol  3.125 mg Oral BID WC  . ciprofloxacin  500 mg Oral BID  . fluticasone  2 spray Each Nare Daily  . folic acid  1 mg Oral Daily  . furosemide  40 mg Intravenous Daily  . furosemide  40 mg Intravenous Once  . loratadine  10 mg Oral Daily  . montelukast  10 mg Oral QHS  . potassium chloride  40 mEq Oral Daily  . pravastatin  80 mg Oral q1800  . sodium chloride  3 mL Intravenous Q12H  . thiamine  100 mg Oral Daily    Time spent on care of this patient: 35 mins  [place] - [phone] Office  [phone]  Contact MD directly via text page:      [email or web address]      password [facility]  01/25/2015, 10:43 AM   LOS: 7 days

## 2015-01-25 NOTE — Progress Notes (Signed)
Site area: rt groin fa sheath Site Prior to Removal:  Level 0 Pressure Applied For:  20 minutes Manual:   yes Patient Status During Pull:  stable Post Pull Site:  Level  0 Post Pull Instructions Given:  yes Post Pull Pulses Present:  yes Dressing Applied:  tegaderm Bedrest begins @  0900 Comments:  0

## 2015-01-26 ENCOUNTER — Inpatient Hospital Stay (HOSPITAL_COMMUNITY): Payer: Medicare Other

## 2015-01-26 DIAGNOSIS — I5031 Acute diastolic (congestive) heart failure: Secondary | ICD-10-CM

## 2015-01-26 LAB — CBC
HCT: 30.4 % — ABNORMAL LOW (ref 36.0–46.0)
HEMOGLOBIN: 10.3 g/dL — AB (ref 12.0–15.0)
MCH: 32.5 pg (ref 26.0–34.0)
MCHC: 33.9 g/dL (ref 30.0–36.0)
MCV: 95.9 fL (ref 78.0–100.0)
Platelets: 183 10*3/uL (ref 150–400)
RBC: 3.17 MIL/uL — ABNORMAL LOW (ref 3.87–5.11)
RDW: 12.6 % (ref 11.5–15.5)
WBC: 6.8 10*3/uL (ref 4.0–10.5)

## 2015-01-26 LAB — BASIC METABOLIC PANEL
ANION GAP: 7 (ref 5–15)
BUN: 14 mg/dL (ref 6–20)
CO2: 27 mmol/L (ref 22–32)
Calcium: 7.5 mg/dL — ABNORMAL LOW (ref 8.9–10.3)
Chloride: 99 mmol/L — ABNORMAL LOW (ref 101–111)
Creatinine, Ser: 1.05 mg/dL — ABNORMAL HIGH (ref 0.44–1.00)
GFR calc Af Amer: 55 mL/min — ABNORMAL LOW (ref >60)
GFR, EST NON AFRICAN AMERICAN: 48 mL/min — AB (ref >60)
GLUCOSE: 113 mg/dL — AB (ref 65–99)
Potassium: 3.8 mmol/L (ref 3.5–5.1)
SODIUM: 133 mmol/L — AB (ref 135–145)

## 2015-01-26 MED ORDER — GABAPENTIN 100 MG PO CAPS
100.0000 mg | ORAL_CAPSULE | Freq: Two times a day (BID) | ORAL | Status: DC
  Administered 2015-01-26 – 2015-01-28 (×5): 100 mg via ORAL
  Filled 2015-01-26 (×6): qty 1

## 2015-01-26 MED ORDER — ESCITALOPRAM OXALATE 10 MG PO TABS
10.0000 mg | ORAL_TABLET | Freq: Every day | ORAL | Status: DC
  Administered 2015-01-26 – 2015-01-28 (×3): 10 mg via ORAL
  Filled 2015-01-26 (×3): qty 1

## 2015-01-26 MED ORDER — FUROSEMIDE 40 MG PO TABS
40.0000 mg | ORAL_TABLET | Freq: Every day | ORAL | Status: DC
  Administered 2015-01-27 – 2015-01-28 (×2): 40 mg via ORAL
  Filled 2015-01-26 (×2): qty 1

## 2015-01-26 NOTE — Progress Notes (Signed)
Progress Note  [name] IOX:[id number] DOB: 02/28/1929 DOA: 01/18/2015 PCP: [name], MD  Brief Narrative: 81 year old F Hx Anxiety, Combined Systolic and Diastolic CHF, HTN, HLD, Carotid Artery Stenosis, CAD with prior NSTEMI, Dyslipidemia, PVD associated neuropathy, COPD, GERD, Osteoporosis, and Chronic Venous insufficiency who presented to the ER c/o abdominal pain which began abruptly with swelling and discomfort. Her last BM was 2 days prior. She became short of breath after the development of the abdominal distention.   In the ER a CT of the abdomen revealed a distal colonic obstruction in the mid sigmoid colon with appearance highly suspicious for obstructing colon adenocarcinoma. There was significant colonic distention with air in the colon. There was also an incidental finding of 3.1 cm indeterminate right adrenal mass along with tiny hepatic cysts. Labs revealed hyponatremia with a sodium 128 but BUN and creatinine at baseline of 34/1.10.   Patient subsequently underwent sigmoidoscopy which actually revealed evidence for diverticulitis. Repeat CT scan shows improvement in the obstruction. Patient subsequently underwent a cardiac catheterization which showed patent stents. Plavix was discontinued.  Subjective: Patient feels better. Not as short of breath as before. Denies any chest pain. Tolerating diet. Having bowel movements. Passing gas.   Assessment/Plan:  Mid sigmoid colonic obstruction  -initial CT was worrisome for obstructing mass/neoplasm -CEA was negative -CT chest negative for metastatic disease -Sigmoidoscopy 01/21/2015 noted significant stool decreasing quality of exam but no obvious mass/stricture appreciated. Diverticulitis suggested. -Follow up CT abdomen 7/12 noted resolving distal colonic sigmoid obstruction with short segment of sigmoid circumferential wall thickening and extensive sigmoid diverticulosis  -CCS has evaluated - no indication for surgical  intervention thus far -Diet has been advanced. Discussed with Dr. Earlean Shawl with gastroenterology. He recommends oral ciprofloxacin. He would like to follow-up with this patient in his office in 1-2 weeks.   Acute blood loss anemia / ?GI bleed -When she had a NG tube suction returned dark bilious b/s blood stained fluid. Hemoglobin has been stable. No further recurrence of bleeding.  Acute hyponatremia -Likely due to hypovolemia - corrected   Leg edema, left -LLE Doppler negative for DVT/SVT   Right adrenal mass -MRI abdomen pelvis nondiagnostic - diff includes adrenal adenoma, with adrenal metastasis considered less likely - f/u CT in 6 months suggested v/s outpt. This can be pursued further by her PCP. She may need hormonal workup.   CAD in native artery / elevated troponin -Cardiac enzymes trending down -Cardiology following. Cardiac catheterization was performed on July 15. Stents were noted to be patent. No new obstructive disease identified. Continued medical management was recommended. Plavix was discontinued by the cardiologist.   Acute exacerbation of chronic combined systolic and diastolic CHF, NYHA class 2 -most recent 2D echo with EF 40-45% -suffered acute episode of pulmonary edema night of 7/12 - quickly stabilized w/ diuresis - wean O2 as able -Cards following  -Daily weights. Good urine output noted. In negative fluid balance. Continue IV Lasix for 1 more day today. Transition to oral Lasix tomorrow. Chest x-ray shows improvement in aeration.   Acute hypoxic respiratory failure due to pulmonary edema  -see above. Wean off of oxygen.  CKD, stage III Creatinine is noted to be mildly higher than yesterday. Continue to monitor.  Dyslipidemia Stable  COPD -Currently compensated without evidence of wheezing or hypoxemia -Continue albuterol nebs PRN  Neuropathy due to peripheral vascular disease  DVT prophylaxis: SCD Code Status: FULL Family Communication: Discussed  with the patient. Disposition Plan: PT and OT recommends  SNF. Social worker is aware. Anticipate discharge on Monday.  Consultants: Dr.Mohan [facility] (Cardiology) Dr. Richmond Campbell (GI) Dr.Armando Rosendo Gros (CCS)   Procedure/Significant Events: 7/8 CT abdomen pelvis with contrast Distal colonic obstruction mid sigmoid colon, highly suspicious for obstructing colon adenocarcinoma -Sigmoid diverticulosis -No definite evidence of metastatic disease within the abdomen or pelvis. - 3.1 cm indeterminate right adrenal mass and probable tiny hepatic cysts.  7/9 LLE venous Doppler negative DVT/SVT 7/9 MRI abdomen W/Wo contrast -3.0 cm right adrenal mass nonspecific MR characteristics -atypical adrenal adenoma, vs adrenal metastasis -Consider PET-CT scan for further evaluation, or continued followup by CT in 6 months. 7/9 CT chest WO contrast; negative metastatic disease in the chest. -Severe compression fracture deformities at T6 and T12. Moderate compression fracture deformity at T8. 7/9 echocardiogram EF 40% to 45% - (grade 1 diastolic dysfunction).  Antibiotics: None   Objective: Blood pressure 147/80, pulse 100, temperature 98.1 F (36.7 C), temperature source Oral, resp. rate 16, height 5\' 6"  (1.676 m), weight 76 kg (167 lb 8.8 oz), SpO2 97 %.  Intake/Output Summary (Last 24 hours) at 01/26/15 0820 Last data filed at 01/26/15 0754  Gross per 24 hour  Intake    520 ml  Output   1900 ml  Net  -1380 ml   Exam: General: No distress - alert and conversant Lungs: Improved air entry bilaterally. Few crackles at the bases but no wheezing or rhonchi.  Cardiovascular: Regular rate and rhythm without murmur gallop or rub  Abdomen: Nontender, soft, bowel sounds normal, no rebound, no ascites, no appreciable mass Extremities: No significant cyanosis, clubbing, edema bilateral lower extremities  Data Reviewed: Basic Metabolic Panel:  Recent Labs Lab 01/21/15 0548 01/22/15 0406 01/23/15 0535  01/24/15 0527 01/25/15 0554 01/26/15 0538  NA 136 138 138 133* 132* 133*  K 4.1 3.9 3.7 4.4 4.1 3.8  CL 106 108 101 97* 97* 99*  CO2 26 23 24 29 26 27   GLUCOSE 108* 133* 70 107* 110* 113*  BUN 9 7 9 9 10 14   CREATININE 0.78 0.78 0.93 0.87 0.91 1.05*  CALCIUM 7.0* 7.1* 7.0* 7.1* 7.3* 7.5*  MG 2.3  --   --   --   --   --    Liver Function Tests:  Recent Labs Lab 01/21/15 0548 01/22/15 0406 01/24/15 0527  AST 15 16 18   ALT 10* 12* 12*  ALKPHOS 39 42 44  BILITOT 0.6 0.6 0.5  PROT 5.5* 5.8* 5.6*  ALBUMIN 2.8* 3.1* 3.0*   No results for input(s): LIPASE, AMYLASE in the last 168 hours. CBC:  Recent Labs Lab 01/20/15 0458 01/21/15 0548 01/23/15 0535 01/25/15 0554 01/26/15 0538  WBC 7.5 6.4 7.0 8.3 6.8  NEUTROABS  --  4.5  --   --   --   HGB 8.9* 8.8* 9.5* 10.1* 10.3*  HCT 27.0* 27.1* 28.7* 29.8* 30.4*  MCV 98.9 100.0 98.0 97.1 95.9  PLT 151 149* 144* 147* 183   Cardiac Enzymes:  Recent Labs Lab 01/20/15 0013 01/20/15 2015 01/21/15 0548 01/23/15 0535  TROPONINI 0.06* 0.07* 0.05* 0.04*   BNP (last 3 results)  Recent Labs  01/18/15 1228 01/22/15 0405 01/23/15 0535  BNP 1610.0* 1857.1* 990.7*    ProBNP (last 3 results)  Recent Labs  03/01/14 1930  PROBNP 2785.0*    CBG: No results for input(s): GLUCAP in the last 168 hours.  Recent Results (from the past 240 hour(s))  MRSA PCR Screening     Status: None   Collection  Time: 01/18/15  6:58 PM  Result Value Ref Range Status   MRSA by PCR NEGATIVE NEGATIVE Final    Comment:        The GeneXpert MRSA Assay (FDA approved for NASAL specimens only), is one component of a comprehensive MRSA colonization surveillance program. It is not intended to diagnose MRSA infection nor to guide or monitor treatment for MRSA infections.       Scheduled Meds:  Scheduled Meds: . aspirin EC  81 mg Oral Daily  . carvedilol  3.125 mg Oral BID WC  . ciprofloxacin  500 mg Oral BID  . escitalopram  10 mg Oral  Daily  . fluticasone  2 spray Each Nare Daily  . folic acid  1 mg Oral Daily  . furosemide  40 mg Intravenous Daily  . gabapentin  100 mg Oral BID  . loratadine  10 mg Oral Daily  . montelukast  10 mg Oral QHS  . potassium chloride  40 mEq Oral Daily  . pravastatin  80 mg Oral q1800  . sodium chloride  3 mL Intravenous Q12H  . thiamine  100 mg Oral Daily    Time spent on care of this patient: 35 mins  [place] - [phone] Office  [phone]  Contact MD directly via text page:      [email or web address]      password [facility]  01/26/2015, 8:20 AM   LOS: 8 days

## 2015-01-27 LAB — BASIC METABOLIC PANEL
Anion gap: 9 (ref 5–15)
BUN: 17 mg/dL (ref 6–20)
CO2: 26 mmol/L (ref 22–32)
Calcium: 7.6 mg/dL — ABNORMAL LOW (ref 8.9–10.3)
Chloride: 95 mmol/L — ABNORMAL LOW (ref 101–111)
Creatinine, Ser: 1.03 mg/dL — ABNORMAL HIGH (ref 0.44–1.00)
GFR calc Af Amer: 57 mL/min — ABNORMAL LOW (ref >60)
GFR calc non Af Amer: 49 mL/min — ABNORMAL LOW (ref >60)
GLUCOSE: 109 mg/dL — AB (ref 65–99)
POTASSIUM: 4.1 mmol/L (ref 3.5–5.1)
SODIUM: 130 mmol/L — AB (ref 135–145)

## 2015-01-27 LAB — CBC
HEMATOCRIT: 31.2 % — AB (ref 36.0–46.0)
Hemoglobin: 10.5 g/dL — ABNORMAL LOW (ref 12.0–15.0)
MCH: 32.7 pg (ref 26.0–34.0)
MCHC: 33.7 g/dL (ref 30.0–36.0)
MCV: 97.2 fL (ref 78.0–100.0)
PLATELETS: 196 10*3/uL (ref 150–400)
RBC: 3.21 MIL/uL — ABNORMAL LOW (ref 3.87–5.11)
RDW: 12.8 % (ref 11.5–15.5)
WBC: 7.5 10*3/uL (ref 4.0–10.5)

## 2015-01-27 MED ORDER — CIPROFLOXACIN HCL 500 MG PO TABS: 500.0000 mg | ORAL_TABLET | Freq: Two times a day (BID) | ORAL | Status: DC

## 2015-01-27 MED ORDER — ACETAMINOPHEN 325 MG PO TABS: 650.0000 mg | ORAL_TABLET | ORAL | Status: DC | PRN

## 2015-01-27 NOTE — Progress Notes (Signed)
Progress Note  Haley Harris BZJ:[id number] DOB: [birth year] DOA: 01/18/2015 PCP: [name], MD  Brief Narrative: 81 year old F Hx Anxiety, Combined Systolic and Diastolic CHF, HTN, HLD, Carotid Artery Stenosis, CAD with prior NSTEMI, Dyslipidemia, PVD associated neuropathy, COPD, GERD, Osteoporosis, and Chronic Venous insufficiency who presented to the ER c/o abdominal pain which began abruptly with swelling and discomfort. Her last BM was 2 days prior. She became short of breath after the development of the abdominal distention.   In the ER a CT of the abdomen revealed a distal colonic obstruction in the mid sigmoid colon with appearance highly suspicious for obstructing colon adenocarcinoma. There was significant colonic distention with air in the colon. There was also an incidental finding of 3.1 cm indeterminate right adrenal mass along with tiny hepatic cysts. Labs revealed hyponatremia with a sodium 128 but BUN and creatinine at baseline of 34/1.10.   Patient subsequently underwent sigmoidoscopy which actually revealed evidence for diverticulitis. Repeat CT scan shows improvement in the obstruction. Patient subsequently underwent a cardiac catheterization which showed patent stents. Plavix was discontinued.  Subjective: Patient continues to feel well. Shortness of breath is resolved. Tolerating her diet. Passing gas. Ready to go to rehabilitation tomorrow.   Assessment/Plan:  Mid sigmoid colonic obstruction  -initial CT was worrisome for obstructing mass/neoplasm -CEA was negative -CT chest negative for metastatic disease -Sigmoidoscopy 01/21/2015 noted significant stool decreasing quality of exam but no obvious mass/stricture appreciated. Diverticulitis suggested. -Follow up CT abdomen 7/12 noted resolving distal colonic sigmoid obstruction with short segment of sigmoid circumferential wall thickening and extensive sigmoid diverticulosis  -CCS has evaluated - no indication for  surgical intervention thus far -Diet has been advanced. Discussed with Dr. Earlean Shawl with gastroenterology. He recommends oral ciprofloxacin until patient sees him in his office. He would like to follow-up with this patient in his office in 1-2 weeks.   Acute blood loss anemia / ?GI bleed -When she had a NG tube suction returned dark bilious b/s blood stained fluid. Hemoglobin has been stable. No further recurrence of bleeding.  Acute hyponatremia -Likely due to hypovolemia - corrected   Leg edema, left -LLE Doppler negative for DVT/SVT   Right adrenal mass -MRI abdomen pelvis nondiagnostic - diff includes adrenal adenoma, with adrenal metastasis considered less likely - f/u CT in 6 months suggested v/s outpt. This can be pursued further by her PCP. She may need hormonal workup.   CAD in native artery / elevated troponin -Cardiac enzymes trending down -Cardiology following. Cardiac catheterization was performed on July 15. Stents were noted to be patent. No new obstructive disease identified. Continued medical management was recommended. Plavix was discontinued by the cardiologist.   Acute exacerbation of chronic combined systolic and diastolic CHF, NYHA class 2 -most recent 2D echo with EF 40-45% -suffered acute episode of pulmonary edema night of 7/12 - quickly stabilized w/ diuresis - wean O2 as able -Cards following  -Daily weights. Good urine output noted. In negative fluid balance. Transition to oral Lasix today. Chest x-ray shows improvement in aeration.   Acute hypoxic respiratory failure due to pulmonary edema  -see above. Currently off of oxygen..  CKD, stage III Stable.  Dyslipidemia Stable  COPD Stable. Continue albuterol nebs PRN  Neuropathy due to peripheral vascular disease  DVT prophylaxis: SCD Code Status: FULL Family Communication: Discussed with the patient. Disposition Plan: PT and OT recommends SNF. Social worker is aware. Anticipate discharge on  Monday.  Consultants: Dr.Mohan [facility] (Cardiology) Dr. Richmond Campbell (GI) Dr.Armando  Rosendo Gros (CCS)   Procedure/Significant Events: 7/8 CT abdomen pelvis with contrast Distal colonic obstruction mid sigmoid colon, highly suspicious for obstructing colon adenocarcinoma -Sigmoid diverticulosis -No definite evidence of metastatic disease within the abdomen or pelvis. - 3.1 cm indeterminate right adrenal mass and probable tiny hepatic cysts.  7/9 LLE venous Doppler negative DVT/SVT 7/9 MRI abdomen W/Wo contrast -3.0 cm right adrenal mass nonspecific MR characteristics -atypical adrenal adenoma, vs adrenal metastasis -Consider PET-CT scan for further evaluation, or continued followup by CT in 6 months. 7/9 CT chest WO contrast; negative metastatic disease in the chest. -Severe compression fracture deformities at T6 and T12. Moderate compression fracture deformity at T8. 7/9 echocardiogram EF 40% to 45% - (grade 1 diastolic dysfunction).  Antibiotics: None   Objective: Blood pressure 112/82, pulse 84, temperature 97.2 F (36.2 C), temperature source Oral, resp. rate 18, height 5\' 6"  (1.676 m), weight 75.9 kg (167 lb 5.3 oz), SpO2 98 %.  Intake/Output Summary (Last 24 hours) at 01/27/15 0759 Last data filed at 01/26/15 2112  Gross per 24 hour  Intake    720 ml  Output    600 ml  Net    120 ml   Exam: General: No distress - alert and conversant Lungs: Improved air entry bilaterally. Few crackles at the bases but no wheezing or rhonchi.  Cardiovascular: Regular rate and rhythm without murmur gallop or rub  Abdomen: Nontender, soft, bowel sounds normal, no rebound, no ascites, no appreciable mass Extremities: No significant cyanosis, clubbing, edema bilateral lower extremities  Data Reviewed: Basic Metabolic Panel:  Recent Labs Lab 01/21/15 0548  01/23/15 0535 01/24/15 0527 01/25/15 0554 01/26/15 0538 01/27/15 0421  NA 136  < > 138 133* 132* 133* 130*  K 4.1  < > 3.7 4.4 4.1  3.8 4.1  CL 106  < > 101 97* 97* 99* 95*  CO2 26  < > 24 29 26 27 26   GLUCOSE 108*  < > 70 107* 110* 113* 109*  BUN 9  < > 9 9 10 14 17   CREATININE 0.78  < > 0.93 0.87 0.91 1.05* 1.03*  CALCIUM 7.0*  < > 7.0* 7.1* 7.3* 7.5* 7.6*  MG 2.3  --   --   --   --   --   --   < > = values in this interval not displayed. Liver Function Tests:  Recent Labs Lab 01/21/15 0548 01/22/15 0406 01/24/15 0527  AST 15 16 18   ALT 10* 12* 12*  ALKPHOS 39 42 44  BILITOT 0.6 0.6 0.5  PROT 5.5* 5.8* 5.6*  ALBUMIN 2.8* 3.1* 3.0*   No results for input(s): LIPASE, AMYLASE in the last 168 hours. CBC:  Recent Labs Lab 01/21/15 0548 01/23/15 0535 01/25/15 0554 01/26/15 0538 01/27/15 0421  WBC 6.4 7.0 8.3 6.8 7.5  NEUTROABS 4.5  --   --   --   --   HGB 8.8* 9.5* 10.1* 10.3* 10.5*  HCT 27.1* 28.7* 29.8* 30.4* 31.2*  MCV 100.0 98.0 97.1 95.9 97.2  PLT 149* 144* 147* 183 196   Cardiac Enzymes:  Recent Labs Lab 01/20/15 2015 01/21/15 0548 01/23/15 0535  TROPONINI 0.07* 0.05* 0.04*   BNP (last 3 results)  Recent Labs  01/18/15 1228 01/22/15 0405 01/23/15 0535  BNP 1610.0* 1857.1* 990.7*    ProBNP (last 3 results)  Recent Labs  03/01/14 1930  PROBNP 2785.0*    CBG: No results for input(s): GLUCAP in the last 168 hours.  Recent Results (from  the past 240 hour(s))  MRSA PCR Screening     Status: None   Collection Time: 01/18/15  6:58 PM  Result Value Ref Range Status   MRSA by PCR NEGATIVE NEGATIVE Final    Comment:        The GeneXpert MRSA Assay (FDA approved for NASAL specimens only), is one component of a comprehensive MRSA colonization surveillance program. It is not intended to diagnose MRSA infection nor to guide or monitor treatment for MRSA infections.       Scheduled Meds:  Scheduled Meds: . aspirin EC  81 mg Oral Daily  . carvedilol  3.125 mg Oral BID WC  . ciprofloxacin  500 mg Oral BID  . escitalopram  10 mg Oral Daily  . fluticasone  2 spray Each  Nare Daily  . folic acid  1 mg Oral Daily  . furosemide  40 mg Oral Daily  . gabapentin  100 mg Oral BID  . loratadine  10 mg Oral Daily  . montelukast  10 mg Oral QHS  . potassium chloride  40 mEq Oral Daily  . pravastatin  80 mg Oral q1800  . sodium chloride  3 mL Intravenous Q12H  . thiamine  100 mg Oral Daily    Time spent on care of this patient: 35 mins  [employer] - [phone] Office  [phone]  Contact MD directly via text page:      [email or web address]      password [facility]  01/27/2015, 7:59 AM   LOS: 9 days

## 2015-01-27 NOTE — Discharge Instructions (Signed)

## 2015-01-28 ENCOUNTER — Telehealth: Payer: Self-pay | Admitting: Internal Medicine

## 2015-01-28 ENCOUNTER — Ambulatory Visit: Payer: Medicare Other

## 2015-01-28 DIAGNOSIS — I10 Essential (primary) hypertension: Secondary | ICD-10-CM | POA: Diagnosis not present

## 2015-01-28 DIAGNOSIS — K5669 Other intestinal obstruction: Secondary | ICD-10-CM | POA: Diagnosis not present

## 2015-01-28 DIAGNOSIS — I5040 Unspecified combined systolic (congestive) and diastolic (congestive) heart failure: Secondary | ICD-10-CM | POA: Diagnosis not present

## 2015-01-28 DIAGNOSIS — M170 Bilateral primary osteoarthritis of knee: Secondary | ICD-10-CM | POA: Diagnosis not present

## 2015-01-28 DIAGNOSIS — J810 Acute pulmonary edema: Secondary | ICD-10-CM | POA: Diagnosis not present

## 2015-01-28 DIAGNOSIS — R262 Difficulty in walking, not elsewhere classified: Secondary | ICD-10-CM | POA: Diagnosis not present

## 2015-01-28 DIAGNOSIS — G629 Polyneuropathy, unspecified: Secondary | ICD-10-CM | POA: Diagnosis not present

## 2015-01-28 DIAGNOSIS — N183 Chronic kidney disease, stage 3 (moderate): Secondary | ICD-10-CM | POA: Diagnosis not present

## 2015-01-28 DIAGNOSIS — R1084 Generalized abdominal pain: Secondary | ICD-10-CM | POA: Diagnosis not present

## 2015-01-28 DIAGNOSIS — I509 Heart failure, unspecified: Secondary | ICD-10-CM | POA: Diagnosis not present

## 2015-01-28 DIAGNOSIS — M25519 Pain in unspecified shoulder: Secondary | ICD-10-CM | POA: Diagnosis not present

## 2015-01-28 DIAGNOSIS — M25552 Pain in left hip: Secondary | ICD-10-CM | POA: Diagnosis not present

## 2015-01-28 DIAGNOSIS — M25529 Pain in unspecified elbow: Secondary | ICD-10-CM | POA: Diagnosis not present

## 2015-01-28 DIAGNOSIS — D649 Anemia, unspecified: Secondary | ICD-10-CM | POA: Diagnosis not present

## 2015-01-28 DIAGNOSIS — E871 Hypo-osmolality and hyponatremia: Secondary | ICD-10-CM | POA: Diagnosis not present

## 2015-01-28 DIAGNOSIS — M7581 Other shoulder lesions, right shoulder: Secondary | ICD-10-CM | POA: Diagnosis not present

## 2015-01-28 DIAGNOSIS — R600 Localized edema: Secondary | ICD-10-CM | POA: Diagnosis not present

## 2015-01-28 DIAGNOSIS — M1712 Unilateral primary osteoarthritis, left knee: Secondary | ICD-10-CM | POA: Diagnosis not present

## 2015-01-28 DIAGNOSIS — R5381 Other malaise: Secondary | ICD-10-CM | POA: Diagnosis not present

## 2015-01-28 DIAGNOSIS — I5042 Chronic combined systolic (congestive) and diastolic (congestive) heart failure: Secondary | ICD-10-CM | POA: Diagnosis not present

## 2015-01-28 DIAGNOSIS — K5732 Diverticulitis of large intestine without perforation or abscess without bleeding: Secondary | ICD-10-CM | POA: Diagnosis not present

## 2015-01-28 DIAGNOSIS — R278 Other lack of coordination: Secondary | ICD-10-CM | POA: Diagnosis not present

## 2015-01-28 DIAGNOSIS — D62 Acute posthemorrhagic anemia: Secondary | ICD-10-CM | POA: Diagnosis not present

## 2015-01-28 DIAGNOSIS — I2510 Atherosclerotic heart disease of native coronary artery without angina pectoris: Secondary | ICD-10-CM | POA: Diagnosis not present

## 2015-01-28 DIAGNOSIS — M4854XD Collapsed vertebra, not elsewhere classified, thoracic region, subsequent encounter for fracture with routine healing: Secondary | ICD-10-CM | POA: Diagnosis not present

## 2015-01-28 DIAGNOSIS — M810 Age-related osteoporosis without current pathological fracture: Secondary | ICD-10-CM | POA: Diagnosis not present

## 2015-01-28 DIAGNOSIS — M6281 Muscle weakness (generalized): Secondary | ICD-10-CM | POA: Diagnosis not present

## 2015-01-28 DIAGNOSIS — I252 Old myocardial infarction: Secondary | ICD-10-CM | POA: Diagnosis not present

## 2015-01-28 DIAGNOSIS — M1711 Unilateral primary osteoarthritis, right knee: Secondary | ICD-10-CM | POA: Diagnosis not present

## 2015-01-28 MED ORDER — OXYCODONE-ACETAMINOPHEN 5-325 MG PO TABS: 1.0000 | ORAL_TABLET | ORAL | Status: DC | PRN

## 2015-01-28 MED FILL — Nitroglycerin IV Soln 100 MCG/ML in D5W: INTRA_ARTERIAL | Qty: 10 | Status: AC

## 2015-01-28 NOTE — Clinical Social Work Placement (Signed)
   CLINICAL SOCIAL WORK PLACEMENT  NOTE  Date:  01/28/2015  Patient Details  Name: Haley Harris MRN: [id number] Date of Birth: 1928-11-16  Clinical Social Work is seeking post-discharge placement for this patient at the [place] level of care (*CSW will initial, date and re-position this form in  chart as items are completed):  Yes   Patient/family provided with [place] Work Department's list of facilities offering this level of care within the geographic area requested by the patient (or if unable, by the patient's family).  Yes   Patient/family informed of their freedom to choose among providers that offer the needed level of care, that participate in Medicare, Medicaid or managed care program needed by the patient, have an available bed and are willing to accept the patient.  Yes   Patient/family informed of [facility]'s ownership interest in [facility] and [facility], as well as of the fact that they are under no obligation to receive care at these facilities.  PASRR submitted to EDS on       PASRR number received on       Existing PASRR number confirmed on 01/25/15     FL2 transmitted to all facilities in geographic area requested by pt/family on 01/25/15     FL2 transmitted to all facilities within larger geographic area on       Patient informed that his/her managed care company has contracts with or will negotiate with certain facilities, including the following:        Yes   Patient/family informed of bed offers received.  Patient chooses bed at [facility]     Physician recommends and patient chooses bed at      Patient to be transferred to [facility] on 01/28/15.  Patient to be transferred to facility by Ambulance Corey Harold)     Patient family notified on 01/28/15 of transfer.  Name of family member notified:  [name]- daughter     PHYSICIAN Please prepare priority discharge summary, including medications,  Please sign FL2, Please prepare prescriptions     Additional Comment: Ok per MD for d/c today to SNF for short term rehab. CSW met with patient and spoke to her daughter [name] via telephone. [facility] is the facility of choice per patient and family as she has been a resident there in the past.  Nursing notified to call report to facility; patient and daughter denied any further questions or concerns. CSW signing off.  Lorie Phenix. Murrell Redden [phone]      _______________________________________________ Williemae Area, LCSW 01/28/2015, 2:53 PM

## 2015-01-28 NOTE — Telephone Encounter (Signed)
Spoke to her Reviewed her hospital course Heading to [facility] for rehab now Will be following up with Dr Earlean Shawl (for the diverticulitis) and Dr Terrence Dupont for her heart--- had CHF exacerbation but cath was benign

## 2015-01-28 NOTE — Progress Notes (Signed)
1500 Report given to [place] transferred to [facility] via [employer]

## 2015-01-28 NOTE — Care Management Important Message (Signed)
Important Message  Patient Details  Name: Haley Harris MRN: [id number] Date of Birth: [birth year]   Medicare Important Message Given:  Yes-fourth notification given    Pricilla Handler 01/28/2015, 2:57 PM

## 2015-01-28 NOTE — Progress Notes (Signed)
Per MD order, PICC line removed. Cath intact at 43cm. Vaseline pressure gauze to site, pressure held x 5min. No bleeding to site. Pt instructed to keep dressing CDI x 24 hours. Avoid heavy lifting, pushing or pulling x 24 hours,  If bleeding occurs hold pressure, if bleeding does not stop contact MD or go to the ED. Pt does not have any questions. [name]  

## 2015-01-28 NOTE — Discharge Summary (Signed)
[facility]  Physician Discharge Summary   Patient ID: Haley Harris MRN: [id number] DOB/AGE: 79-Jun-1930 79 y.o.  Admit date: 01/18/2015 Discharge date: 01/28/2015  PCP: [name], MD  DISCHARGE DIAGNOSES:  Principal Problem:   Colon obstruction Active Problems:   Dyslipidemia   COPD (chronic obstructive pulmonary disease)   CAD (coronary artery disease), native coronary artery   Chronic combined systolic and diastolic CHF, NYHA class 2   Neuropathy due to peripheral vascular disease   Elevated troponin   Acute hyponatremia   Leg edema, left   CKD (chronic kidney disease), stage III   Right adrenal mass   Acute blood loss anemia   Hyponatremia   CAD in native artery   Chronic combined systolic and diastolic CHF (congestive heart failure)   Chronic kidney disease, stage III (moderate)   Other emphysema   PVD (peripheral vascular disease)   Adrenal mass, right   Metastatic cancer   Neoplasm   Acute pulmonary edema   RECOMMENDATIONS FOR OUTPATIENT FOLLOW UP: 1. Patient to follow-up with Dr. Earlean Shawl in the next 1-2 weeks. She should continue her ciprofloxacin until then. 2. Her Plavix has been discontinued by her cardiologist 3. Incidentally, a right adrenal mass was identified. She will need further follow-up for same. She may need hormonal workup.   DISCHARGE CONDITION: fair  Diet recommendation: Heart healthy  Filed Weights   01/26/15 0853 01/27/15 0550 01/28/15 1740  Weight: 76.159 kg (167 lb 14.4 oz) 75.9 kg (167 lb 5.3 oz) 77.111 kg (170 lb)    INITIAL HISTORY: 81 year old F Hx Anxiety, Combined Systolic and Diastolic CHF, HTN, HLD, Carotid Artery Stenosis, CAD with prior NSTEMI, Dyslipidemia, PVD associated neuropathy, COPD, GERD, Osteoporosis, and Chronic Venous insufficiency who presented to the ER c/o abdominal pain which began abruptly with swelling and discomfort. Her last BM was 2 days prior. She became short of breath after the development of  the abdominal distention.   In the ER a CT of the abdomen revealed a distal colonic obstruction in the mid sigmoid colon with appearance highly suspicious for obstructing colon adenocarcinoma. There was significant colonic distention with air in the colon. There was also an incidental finding of 3.1 cm indeterminate right adrenal mass along with tiny hepatic cysts. Labs revealed hyponatremia with a sodium 128 but BUN and creatinine at baseline of 34/1.10.   Patient subsequently underwent sigmoidoscopy which actually revealed evidence for diverticulitis. Repeat CT scan shows improvement in the obstruction. Patient subsequently underwent a cardiac catheterization which showed patent stents. Plavix was discontinued.  Consultations: Dr.Mohan [facility] (Cardiology) Dr. Richmond Campbell (GI) Dr.Armando Rosendo Gros (CCS)   Procedures: Sigmoidoscopy Cardiac catheterization  HOSPITAL COURSE:   Mid sigmoid colonic obstruction  Initial CT was worrisome for obstructing mass/neoplasm. CEA was negative. CT chest negative for metastatic disease. Sigmoidoscopy 01/21/2015 noted significant stool decreasing quality of exam but no obvious mass/stricture appreciated. Diverticulitis suggested. Follow up CT abdomen 7/12 noted resolving distal colonic sigmoid obstruction with short segment of sigmoid circumferential wall thickening and extensive sigmoid diverticulosis. Patient was seen by general surgery. There is no indication for surgical intervention. Her diet was slowly advanced and she has tolerated. She is having bowel movements. Discussed with Dr. Earlean Shawl with gastroenterology. He recommends oral ciprofloxacin until patient sees him in his office. He would like to follow-up with this patient in his office in 1-2 weeks.   Acute blood loss anemia / ?GI bleed When she had a NG tube suction returned dark bilious b/s blood stained fluid. Hemoglobin  has been stable. No further recurrence of bleeding.  Acute  hyponatremia Likely due to hypovolemia - corrected   Leg edema, left LLE Doppler negative for DVT/SVT   Right adrenal mass MRI abdomen pelvis nondiagnostic - diff includes adrenal adenoma, with adrenal metastasis considered less likely - f/u CT in 6 months suggested v/s outpt. This can be pursued further by her PCP. She may need hormonal workup.   CAD in native artery / elevated troponin She had minimal elevation in troponins. She was seen by cardiology. Cardiac catheterization was performed on July 15. Stents were noted to be patent. No new obstructive disease identified. Continued medical management was recommended. Plavix was discontinued by the cardiologist.   Acute exacerbation of chronic combined systolic and diastolic CHF, NYHA class 2 Most recent 2D echo with EF 40-45%. Suffered acute episode of pulmonary edema night of 7/12. She quickly stabilized w/ diuresis. Now on home oxygen. She has diuresed quite well. She was transitioned to oral Lasix. Chest x-ray shows improvement in aeration.   Acute hypoxic respiratory failure due to pulmonary edema  See above. Currently off of oxygen.  CKD, stage III Stable.  Dyslipidemia Stable  COPD Stable. Continue albuterol nebs PRN  Neuropathy due to peripheral vascular disease Stable  Patient has improved. She was seen by physical and occupational therapy. Skilled nursing facility was recommended for short-term rehabilitation. She can be discharged today.   PERTINENT LABS:  The results of significant diagnostics from this hospitalization (including imaging, microbiology, ancillary and laboratory) are listed below for reference.    Microbiology: Recent Results (from the past 240 hour(s))  MRSA PCR Screening     Status: None   Collection Time: 01/18/15  6:58 PM  Result Value Ref Range Status   MRSA by PCR NEGATIVE NEGATIVE Final    Comment:        The GeneXpert MRSA Assay (FDA approved for NASAL specimens only), is one component  of a comprehensive MRSA colonization surveillance program. It is not intended to diagnose MRSA infection nor to guide or monitor treatment for MRSA infections.      Labs: Basic Metabolic Panel:  Recent Labs Lab 01/23/15 0535 01/24/15 0527 01/25/15 0554 01/26/15 0538 01/27/15 0421  NA 138 133* 132* 133* 130*  K 3.7 4.4 4.1 3.8 4.1  CL 101 97* 97* 99* 95*  CO2 24 29 26 27 26   GLUCOSE 70 107* 110* 113* 109*  BUN 9 9 10 14 17   CREATININE 0.93 0.87 0.91 1.05* 1.03*  CALCIUM 7.0* 7.1* 7.3* 7.5* 7.6*   Liver Function Tests:  Recent Labs Lab 01/22/15 0406 01/24/15 0527  AST 16 18  ALT 12* 12*  ALKPHOS 42 44  BILITOT 0.6 0.5  PROT 5.8* 5.6*  ALBUMIN 3.1* 3.0*   CBC:  Recent Labs Lab 01/23/15 0535 01/25/15 0554 01/26/15 0538 01/27/15 0421  WBC 7.0 8.3 6.8 7.5  HGB 9.5* 10.1* 10.3* 10.5*  HCT 28.7* 29.8* 30.4* 31.2*  MCV 98.0 97.1 95.9 97.2  PLT 144* 147* 183 196   Cardiac Enzymes:  Recent Labs Lab 01/23/15 0535  TROPONINI 0.04*   BNP: BNP (last 3 results)  Recent Labs  01/18/15 1228 01/22/15 0405 01/23/15 0535  BNP 1610.0* 1857.1* 990.7*    IMAGING STUDIES Dg Chest 2 View  01/26/2015   CLINICAL DATA:  Pulmonary edema.  EXAM: CHEST  2 VIEW  COMPARISON:  01/22/2015  FINDINGS: Nasogastric tube in place, right-sided PICC line tip overlying the level of the superior vena cava. Nasogastric tube  has likely been removed.  The heart is enlarged. There is mild perihilar bronchitic change. There has been improvement in aeration since previous exam consistent with improvement in edema. Suspect small bilateral pleural effusions.  IMPRESSION: Improvement in aeration.   Electronically Signed   By: Nolon Nations M.D.   On: 01/26/2015 08:13   Dg Chest 2 View  01/18/2015   CLINICAL DATA:  Increasing shortness of breath with exertion  EXAM: CHEST  2 VIEW  COMPARISON:  March 03, 2014  FINDINGS: There is mild scarring in the left base region. There is no edema or  consolidation. Heart is upper normal in size with pulmonary vascularity within normal limits. There is atherosclerotic change in the aorta. No adenopathy.  There are wedge compression fractures in the mid and lower thoracic spine with increase in kyphosis. There is evidence of old fractures of each proximal humeral metaphysis, stable. There is calcification in the left carotid artery.  IMPRESSION: Scarring left base. No edema or consolidation. No change in cardiac silhouette. Old fractures as noted. Increase in thoracic kyphosis. Calcification left carotid artery.   Electronically Signed   By: Lowella Grip III M.D.   On: 01/18/2015 14:01   Ct Chest Wo Contrast  01/19/2015   CLINICAL DATA:  Sigmoid colon mass, possible adenocarcinoma, evaluate for metastasis  EXAM: CT CHEST WITHOUT CONTRAST  TECHNIQUE: Multidetector CT imaging of the chest was performed following the standard protocol without IV contrast.  COMPARISON:  Chest radiographs dated 01/18/2015  FINDINGS: Mediastinum/Nodes: The heart is top-normal in size. Trace pleural fluid.  Coronary atherosclerosis.  Atherosclerotic calcifications of the aortic arch.  No suspicious mediastinal or axillary lymphadenopathy.  Visualized thyroid is unremarkable.  Lungs/Pleura: Negative mild patchy bibasilar opacities, likely atelectasis. No focal consolidation.  Trace bilateral pleural effusions.  No suspicious pulmonary nodules.  No pneumothorax.  Upper abdomen: Enteric tube coursing into the proximal stomach. Visualized upper abdomen is otherwise unchanged from recent CT, noting a 3.0 cm right adrenal nodule/mass.  Musculoskeletal: Severe compression fracture deformities at T6 and T12. Moderate compression fracture deformity at T8.  IMPRESSION: No evidence of metastatic disease in the chest.  Trace bilateral pleural effusions.  Severe compression fracture deformities at T6 and T12. Moderate compression fracture deformity at T8.   Electronically Signed   By: Julian Hy M.D.   On: 01/19/2015 15:19   Mr Abdomen W Wo Contrast  01/20/2015   CLINICAL DATA:  Indeterminate right adrenal mass seen on recent CT. Distal colonic obstruction due to colon carcinoma.  EXAM: MRI ABDOMEN WITHOUT AND WITH CONTRAST  TECHNIQUE: Multiplanar multisequence MR imaging of the abdomen was performed both before and after the administration of intravenous contrast.  CONTRAST:  58mL MULTIHANCE GADOBENATE DIMEGLUMINE 529 MG/ML IV SOLN  COMPARISON:  AP CT on 01/18/2015  FINDINGS: Lower chest: No acute findings.  Hepatobiliary: A few tiny cysts scattered sub-cm hepatic cysts are noted, however no liver masses are identified.  Pancreas: No masses, inflammatory changes, or fluid collections demonstrated.  Spleen:  Within normal limits in size and appearance.  Adrenal Glands: 3.0 cm right adrenal mass is seen which shows peripheral enhancement and central cystic foci. No macroscopic fat is seen. No evidence of signal dropout on chemical shift imaging. These features are nonspecific. Left adrenal gland is normal in appearance.  Kidneys: No renal masses identified. No evidence of hydronephrosis.  Stomach/Bowel/Peritoneum: Mild dilatation of colonic bowel loops within abdomen again noted.  Vascular/Lymphatic: No pathologically enlarged lymph nodes identified. No abdominal aortic  aneurysm or other significant retroperitoneal abnormality demonstrated.  Other:  None.  Musculoskeletal:  No suspicious bone lesions identified.  IMPRESSION: 3.0 cm right adrenal mass has nonspecific MR characteristics. Differential diagnosis still includes atypical adrenal adenoma, with adrenal metastasis considered less likely. Consider PET-CT scan for further evaluation, or continued followup by CT in 6 months.   Electronically Signed   By: Earle Gell M.D.   On: 01/20/2015 09:28   Ct Abdomen Pelvis W Contrast  01/22/2015   CLINICAL DATA:  Sigmoid obstruction, status post sigmoidoscopy possible rectal cancer.  EXAM: CT  ABDOMEN AND PELVIS WITH CONTRAST  TECHNIQUE: Multidetector CT imaging of the abdomen and pelvis was performed using the standard protocol following bolus administration of intravenous contrast.  CONTRAST:  138mL OMNIPAQUE IOHEXOL 300 MG/ML  SOLN  COMPARISON:  01/18/2015, 01/19/2015  FINDINGS: Lower chest: Trace dependent pleural effusions with left greater than right compressive basilar atelectasis. Heart is enlarged. Aortic and coronary atherosclerosis noted. Mitral valve annular calcifications present. No pericardial effusion. NG tube enters the stomach with the tip noted distally.  Abdomen: Punctate small scattered hepatic hypodensities, suspect small hepatic cysts. No biliary dilatation or obstruction. Patent hepatic and portal veins. Gallbladder, biliary system, pancreas, spleen, and left adrenal gland within normal limits for age and demonstrate no acute process.  Kidneys demonstrate mild cortical atrophy but no obstruction or hydronephrosis.  Stable enhancing left adrenal 3 cm mass which was previously evaluated by MRI on 01/19/2015. This lesion remains indeterminate for atypical adrenal adenoma versus metastasis.  No abdominal free fluid, fluid collection, hemorrhage, abscess, or adenopathy.  Extensive abdominal atherosclerosis and tortuosity. No aneurysm or occlusive process.  Oral contrast throughout the distal small bowel and colon. There is less colonic distension compatible with resolving sigmoid obstruction. No free air evident.  Pelvis: The left colon and sigmoid both demonstrate diverticular changes. There is a short segment of wall thickening of the proximal sigmoid colon, image 68 without associated inflammatory process which could be related to an underlying mural lesion versus sigmoid stricture. Please refer to the recent sigmoidoscopy.  Prior hysterectomy noted. Trace pelvic free fluid. No new pelvic collection, hemorrhage, abscess, or adenopathy. Iliac atherosclerosis noted with tortuosity. No  inguinal abnormality or hernia. Urinary bladder unremarkable.  Bones are osteopenic. Postop changes of the lower lumbar spine. Chronic compression fracture at T12.  IMPRESSION: Resolving distal colonic sigmoid obstruction. Short segment of sigmoid circumferential wall thickening remains suspicious for underlying sigmoid mural lesion versus sigmoid stricture.  Extensive sigmoid diverticulosis  Stable trace pelvic free fluid  Extensive aortoiliac atherosclerosis  Small pleural effusions with compressive basilar atelectasis  Cardiomegaly with coronary calcifications  Small hepatic cysts  Stable 3 cm right adrenal mass. Please refer to the prior MRI for further characterization.   Electronically Signed   By: Jerilynn Mages.  Shick M.D.   On: 01/22/2015 18:37   Ct Abdomen Pelvis W Contrast  01/18/2015   CLINICAL DATA:  Lower abdominal pain, swelling, and cramping since yesterday.  EXAM: CT ABDOMEN AND PELVIS WITH CONTRAST  TECHNIQUE: Multidetector CT imaging of the abdomen and pelvis was performed using the standard protocol following bolus administration of intravenous contrast.  CONTRAST:  61mL OMNIPAQUE IOHEXOL 300 MG/ML  SOLN  COMPARISON:  None.  FINDINGS: Lower Chest: Cardiomegaly and mild dependent bibasilar atelectasis noted.  Hepatobiliary: Probable tiny sub-cm hepatic cysts noted, but no definite liver masses are identified. Gallbladder is unremarkable.  Pancreas: No mass, inflammatory changes, or other significant abnormality identified.  Spleen:  Within normal limits in  size and appearance.  Adrenals: 3.1 cm right adrenal mass is seen which has nonspecific features and enhancement characteristics.  Kidneys/Urinary Tract:  No evidence of masses or hydronephrosis.  Stomach/Bowel/Peritoneum: Small hiatal hernia noted. There is diffuse colonic dilatation. Abrupt transition with shoulder ring is seen in the mid sigmoid colon where there is evidence of annular colonic wall thickening measuring approximately 3.9 cm in length.  This is highly suspicious for obstructing colon carcinoma. Sigmoid diverticulosis is demonstrated, however there is no evidence of diverticulitis. No evidence of small bowel obstruction.  Vascular/Lymphatic: No pathologically enlarged lymph nodes identified. 8 mm left common iliac lymph node noted on image 49/ series 201, which is not considered pathologically enlarged. No abdominal aortic aneurysm or other significant retroperitoneal abnormality demonstrated.  Reproductive: Prior hysterectomy noted. Adnexal regions are unremarkable in appearance. Tiny amount of free fluid seen in the dependent pelvis.  Other:  None.  Musculoskeletal: No suspicious bone lesions identified. Lower lumbar spine fusion hardware and old T12 vertebral body compression fracture noted.  IMPRESSION: Distal colonic obstruction in the mid sigmoid colon, with appearance highly suspicious for obstructing colon adenocarcinoma. Sigmoidoscopy or colonoscopy recommended for further evaluation.  Sigmoid diverticulosis. No radiographic evidence of diverticulitis.  No definite evidence of metastatic disease within the abdomen or pelvis.  3.1 cm indeterminate right adrenal mass and probable tiny hepatic cysts. Abdomen MRI without and with contrast recommended for further evaluation.   Electronically Signed   By: Earle Gell M.D.   On: 01/18/2015 15:15   Dg Chest Port 1 View  01/22/2015   CLINICAL DATA:  Shortness of Breath  EXAM: PORTABLE CHEST - 1 VIEW  COMPARISON:  01/18/2015  FINDINGS: Interval placement of an enteric tube. Tip is off the field of view but below the left hemidiaphragm. Interval placement of a right PICC catheter with tip projected over the cavoatrial junction. No pneumothorax. Tubing projected over the right chest may be external or could indicate a chest tube. Mild cardiac enlargement without significant vascular congestion. Patchy infiltration in the lungs, increasing since prior study. This could indicate pneumonia or edema. No  blunting of costophrenic angles. No pneumothorax. Calcified and tortuous aorta. Degenerative changes in the shoulders.  IMPRESSION: Appliances appear to be in satisfactory location. Cardiac enlargement. Infiltration in both lungs may indicate pneumonia or edema. No pneumothorax.   Electronically Signed   By: Lucienne Capers M.D.   On: 01/22/2015 03:44   Dg Abd Portable 1v  01/18/2015   CLINICAL DATA:  Nausea and abdominal pain since yesterday.  EXAM: PORTABLE ABDOMEN - 1 VIEW  COMPARISON:  CT abdomen and pelvis from the same day.  FINDINGS: NG tube is in place. Moderate distention of the colon per cysts, similar to the topogram of the CT scan. There is no definite free air. Contrast can be seen within the distal small bowel. Surgical changes are noted in the lumbar spine. Atherosclerotic changes are present in the aorta.  IMPRESSION: Similar appearance of distal colonic obstruction.  NG tube in place.   Electronically Signed   By: San Morelle M.D.   On: 01/18/2015 16:57    DISCHARGE EXAMINATION: Filed Vitals:   01/27/15 1430 01/27/15 2217 01/28/15 0613 01/28/15 1010  BP: 141/76 120/86 162/76 134/94  Pulse: 104 92 82 88  Temp: 97.6 F (36.4 C) 97.4 F (36.3 C) 97.9 F (36.6 C)   TempSrc: Oral Oral Oral   Resp: 18 18 19 18   Height:      Weight:   77.111 kg (  170 lb)   SpO2: 98% 100% 98% 98%   General appearance: alert, cooperative, appears stated age and no distress Resp: clear to auscultation bilaterally Cardio: regular rate and rhythm, S1, S2 normal, no murmur, click, rub or gallop GI: soft, non-tender; bowel sounds normal; no masses,  no organomegaly  DISPOSITION: SNF  Discharge Instructions    Call MD for:  difficulty breathing, headache or visual disturbances    Complete by:  As directed      Call MD for:  extreme fatigue    Complete by:  As directed      Call MD for:  persistant dizziness or light-headedness    Complete by:  As directed      Call MD for:  persistant  nausea and vomiting    Complete by:  As directed      Call MD for:  temperature >100.4    Complete by:  As directed      Discharge instructions    Complete by:  As directed   Please follow up with Dr. Earlean Shawl as instructed. Your plavix has been stopped per Dr. Terrence Dupont. Continue Cipro till seen by Dr. Earlean Shawl.  You were cared for by a hospitalist during your hospital stay. If you have any questions about your discharge medications or the care you received while you were in the hospital after you are discharged, you can call the unit and asked to speak with the hospitalist on call if the hospitalist that took care of you is not available. Once you are discharged, your primary care physician will handle any further medical issues. Please note that NO REFILLS for any discharge medications will be authorized once you are discharged, as it is imperative that you return to your primary care physician (or establish a relationship with a primary care physician if you do not have one) for your aftercare needs so that they can reassess your need for medications and monitor your lab values. If you do not have a primary care physician, you can call [phone] for a physician referral.     Increase activity slowly    Complete by:  As directed            ALLERGIES:  Allergies  Allergen Reactions  . Amoxicillin-Pot Clavulanate Anaphylaxis  . Cephalexin Other (See Comments)    Reaction unknown  . Clarithromycin Other (See Comments)    Reaction unknown  . Lidocaine   . Nifedipine Other (See Comments)    Reaction unknown  . Nsaids Other (See Comments)    Heart issue  . Olmesartan Medoxomil     REACTION: cough;  But tolerating Losartan 02/2014  . Penicillins   . Tramadol Hcl Other (See Comments)    Reaction unknown     Current Discharge Medication List    START taking these medications   Details  acetaminophen (TYLENOL) 325 MG tablet Take 2 tablets (650 mg total) by mouth every 4 (four) hours as  needed for headache or mild pain.    ciprofloxacin (CIPRO) 500 MG tablet Take 1 tablet (500 mg total) by mouth 2 (two) times daily. To continue till seen by Dr. Earlean Shawl.      CONTINUE these medications which have CHANGED   Details  oxyCODONE-acetaminophen (PERCOCET/ROXICET) 5-325 MG per tablet Take 1-2 tablets by mouth every 4 (four) hours as needed. Qty: 30 tablet, Refills: 0      CONTINUE these medications which have NOT CHANGED   Details  albuterol (PROVENTIL) (2.5 MG/3ML) 0.083% nebulizer solution Take  3 mLs (2.5 mg total) by nebulization every 6 (six) hours as needed for wheezing or shortness of breath. Qty: 75 mL, Refills: 3    aspirin 81 MG tablet Take 81 mg by mouth daily after lunch.     Calcium Carbonate-Vitamin D (CALTRATE 600+D PO) Take 1 tablet by mouth daily after lunch.     carvedilol (COREG) 12.5 MG tablet Take 1 tablet (12.5 mg total) by mouth 2 (two) times daily with a meal. Qty: 180 tablet, Refills: 3    cetirizine (ZYRTEC) 10 MG tablet Take 10 mg by mouth daily. For allergies    denosumab (PROLIA) 60 MG/ML SOLN injection Inject 60 mg into the skin every 6 (six) months. Administer in upper arm, thigh, or abdomen    escitalopram (LEXAPRO) 10 MG tablet Take 1 tablet (10 mg total) by mouth daily. Qty: 90 tablet, Refills: 3    famotidine (PEPCID) 20 MG tablet Take 1 tablet (20 mg total) by mouth 2 (two) times daily. Qty: 180 tablet, Refills: 3    fluticasone (FLONASE) 50 MCG/ACT nasal spray Place 2 sprays into both nostrils daily. Qty: 48 g, Refills: 3    furosemide (LASIX) 40 MG tablet Take 1 tablet (40 mg total) by mouth daily. Qty: 90 tablet, Refills: 3    gabapentin (NEURONTIN) 100 MG capsule Take 1 capsule (100 mg total) by mouth 2 (two) times daily. Qty: 180 capsule, Refills: 3    losartan (COZAAR) 50 MG tablet TAKE 1 TABLET BY MOUTH DAILY Qty: 90 tablet, Refills: 3    montelukast (SINGULAIR) 10 MG tablet TAKE 1 TABLET BY MOUTH DAILY Qty: 90 tablet,  Refills: 3    Multiple Vitamin (MULITIVITAMIN WITH MINERALS) TABS Take 1 tablet by mouth daily after lunch.     spironolactone (ALDACTONE) 25 MG tablet Take 1 tablet (25 mg total) by mouth daily. Qty: 90 tablet, Refills: 3    vitamin B-12 (CYANOCOBALAMIN) 500 MCG tablet Take 1,000 mcg by mouth 3 (three) times a week. Mon, Wed, Fri    pravastatin (PRAVACHOL) 80 MG tablet Take 1 tablet by mouth  daily Qty: 90 tablet, Refills: 3      STOP taking these medications     clopidogrel (PLAVIX) 75 MG tablet        Follow-up Information    Call [name], MD.   Specialties:  Internal Medicine, Pediatrics   Why:  As needed   Contact information:   [place] [address] [phone]       Follow up with MEDOFF,JEFFREY R, MD. Go on 02/13/2015.   Specialty:  Gastroenterology   Why:  @11 :30. Spoke With [employer] information:   [place] [place] [id number] [phone]       Follow up with Charolette Forward, MD. Go on 04/11/2015.   Specialty:  Cardiology   Why:  Apt already scheduled. Spoke with Helayne Seminole information:   [place] [id number] [phone]       TOTAL DISCHARGE TIME: 71 minutes  [facility] Pager [phone]  01/28/2015, 11:36 AM

## 2015-01-28 NOTE — Progress Notes (Signed)
Physical Therapy Treatment Patient Details Name: MARICSA SAMMONS MRN: [id number] DOB: 26-Dec-1928 Today's Date: 01/28/2015    History of Present Illness Patient is an 81 yo female admitted 01/16/15 with SOB and abdominal pain.  CT showed obstructive mass.  Patient also with elevated troponin.  Was to have cardiac cath today - rescheduled for tomorrow (01/25/15).  PMH:  ICM, MI, CAD, HTN, COPD, CHF, compression fx's/kyphosis, DM, neuropathy, anxiety, PVD    PT Comments    Pt making good progress. Continue to recommend SNF for short term rehab and pt in agreement.  She is hoping to go today.  Follow Up Recommendations  SNF;Supervision/Assistance - 24 hour     Equipment Recommendations  None recommended by PT    Recommendations for Other Services       Precautions / Restrictions Precautions Precautions: Fall Restrictions Weight Bearing Restrictions: No    Mobility  Bed Mobility               General bed mobility comments: up in recliner upon arrival  Transfers Overall transfer level: Needs assistance Equipment used: Rolling walker (2 wheeled) Transfers: Sit to/from Stand Sit to Stand: Min guard         General transfer comment: MIN/guard today with good use of UE  Ambulation/Gait Ambulation/Gait assistance: Min guard Ambulation Distance (Feet): 100 Feet Assistive device: Rolling walker (2 wheeled) Gait Pattern/deviations: Decreased step length - right;Decreased step length - left;Trunk flexed Gait velocity: Decreased   General Gait Details: Cues for posture, decreased step length, but not shuffleing this session.   Stairs            Wheelchair Mobility    Modified Rankin (Stroke Patients Only)       Balance                                    Cognition Arousal/Alertness: Awake/alert Behavior During Therapy: WFL for tasks assessed/performed Overall Cognitive Status: Within Functional Limits for tasks assessed                      Exercises General Exercises - Lower Extremity Ankle Circles/Pumps: 10 reps;Both;AROM Long Arc Quad: AROM;Both;10 reps Hip Flexion/Marching: AROM;10 reps;Both;Seated    General Comments        Pertinent Vitals/Pain Pain Assessment: 0-10 Pain Score: 2  Pain Location: B knees Pain Descriptors / Indicators: Aching Pain Intervention(s): Monitored during session;Limited activity within patient's tolerance    Home Living                      Prior Function            PT Goals (current goals can now be found in the care plan section) Acute Rehab PT Goals Patient Stated Goal: To go to [facility] today PT Goal Formulation: With patient Time For Goal Achievement: 01/31/15 Potential to Achieve Goals: Good Progress towards PT goals: Progressing toward goals    Frequency  Min 3X/week    PT Plan Current plan remains appropriate    Co-evaluation             End of Session Equipment Utilized During Treatment: Gait belt Activity Tolerance: Patient tolerated treatment well Patient left: in chair;with call bell/phone within reach     Time: 1134-1146 PT Time Calculation (min) (ACUTE ONLY): 12 min  Charges:  $Gait Training: 8-22 mins  G Codes:      Lorrie Strauch LUBECK 01/28/2015, 12:26 PM

## 2015-01-29 ENCOUNTER — Non-Acute Institutional Stay (SKILLED_NURSING_FACILITY): Payer: Medicare Other | Admitting: Internal Medicine

## 2015-01-29 DIAGNOSIS — M170 Bilateral primary osteoarthritis of knee: Secondary | ICD-10-CM

## 2015-01-29 DIAGNOSIS — M810 Age-related osteoporosis without current pathological fracture: Secondary | ICD-10-CM

## 2015-01-29 DIAGNOSIS — K5732 Diverticulitis of large intestine without perforation or abscess without bleeding: Secondary | ICD-10-CM

## 2015-01-29 DIAGNOSIS — I5042 Chronic combined systolic (congestive) and diastolic (congestive) heart failure: Secondary | ICD-10-CM | POA: Diagnosis not present

## 2015-01-29 DIAGNOSIS — N183 Chronic kidney disease, stage 3 unspecified: Secondary | ICD-10-CM

## 2015-01-29 DIAGNOSIS — J309 Allergic rhinitis, unspecified: Secondary | ICD-10-CM

## 2015-01-29 DIAGNOSIS — E279 Disorder of adrenal gland, unspecified: Secondary | ICD-10-CM | POA: Diagnosis not present

## 2015-01-29 DIAGNOSIS — K219 Gastro-esophageal reflux disease without esophagitis: Secondary | ICD-10-CM | POA: Diagnosis not present

## 2015-01-29 DIAGNOSIS — R5381 Other malaise: Secondary | ICD-10-CM

## 2015-01-29 DIAGNOSIS — F322 Major depressive disorder, single episode, severe without psychotic features: Secondary | ICD-10-CM

## 2015-01-29 DIAGNOSIS — I10 Essential (primary) hypertension: Secondary | ICD-10-CM | POA: Diagnosis not present

## 2015-01-29 DIAGNOSIS — D62 Acute posthemorrhagic anemia: Secondary | ICD-10-CM | POA: Diagnosis not present

## 2015-01-29 DIAGNOSIS — F329 Major depressive disorder, single episode, unspecified: Secondary | ICD-10-CM

## 2015-01-29 DIAGNOSIS — I2510 Atherosclerotic heart disease of native coronary artery without angina pectoris: Secondary | ICD-10-CM

## 2015-01-29 DIAGNOSIS — G2581 Restless legs syndrome: Secondary | ICD-10-CM

## 2015-01-29 DIAGNOSIS — E871 Hypo-osmolality and hyponatremia: Secondary | ICD-10-CM | POA: Diagnosis not present

## 2015-01-29 DIAGNOSIS — E278 Other specified disorders of adrenal gland: Secondary | ICD-10-CM

## 2015-01-29 NOTE — Progress Notes (Signed)
Patient ID: Haley Harris, female   DOB: [birth year], 81 y.o.   MRN: [id number]     Facility: [facility]    PCP: [name], MD  Code Status: full code  Allergies  Allergen Reactions  . Amoxicillin-Pot Clavulanate Anaphylaxis  . Cephalexin Other (See Comments)    Reaction unknown  . Clarithromycin Other (See Comments)    Reaction unknown  . Lidocaine   . Nifedipine Other (See Comments)    Reaction unknown  . Nsaids Other (See Comments)    Heart issue  . Olmesartan Medoxomil     REACTION: cough;  But tolerating Losartan 02/2014  . Penicillins   . Tramadol Hcl Other (See Comments)    Reaction unknown    Chief Complaint  Patient presents with  . New Admit To SNF     HPI:  81 y.o. patient is here for short term rehabilitation post hospital admission from 01/18/15-01/28/15 with abdominal pain from colonic obstruction found to be from acute diverticulitis on sigmoidoscopy. She was started on antibiotics after discussion with GI and general surgery. She had blood loss anemia with blood stained bilious fluid on NG tube. She did not require any transfusion. She had elevated troponin and chf exacerbation and cardiology was consulted. She underwent cardiac catheterization showing her stents to be patent. Her plavix was discontinued with concern of gi bleed. During this admission, she had incidental finding on adrenal mass on right side which needs outpatient follow up. She also had left leg edema worsening and doppler was done and ruled out DVT. She has PMH of CADM CKD stage 3, COPD, CHF among others. She is seen in her room today. She is sitting on her wheelchair. Her energy has improved. Denies any concern this visit.  Review of Systems:  Constitutional: positive for malaise. Negative for fever, chills, diaphoresis.  HENT: Negative for headache, congestion. Has some nasal discharge and occasional headache with her allergies. Eyes: Negative for eye pain,  blurred vision, double vision Respiratory: Negative for cough, wheezing.  on home o2, has some dyspnea with exertion Cardiovascular: Negative for chest pain, palpitations, leg swelling.  Gastrointestinal: Negative for heartburn, nausea, vomiting, abdominal pain. Had bowel movement yesterday. Denies diarrhea or rectal bleed or melena Genitourinary: Negative for dysuria Musculoskeletal: Negative for back pain, falls in facility. Has knee OA and this bothers her Skin: Negative for itching, rash.  Neurological: Negative for dizziness, tingling, focal weakness Psychiatric/Behavioral: Negative for depression   Past Medical History  Diagnosis Date  . Allergic rhinitis, cause unspecified   . Unspecified cardiovascular disease   . Personal history of malignant neoplasm of breast   . Occlusion and stenosis of carotid artery without mention of cerebral infarction   . Chronic airway obstruction, not elsewhere classified   . Depressive disorder, not elsewhere classified   . Other dyspnea and respiratory abnormality   . Esophageal reflux   . Unspecified hearing loss   . Other and unspecified hyperlipidemia   . Unspecified essential hypertension   . Osteoporosis, unspecified   . Peripheral vascular disease, unspecified   . Unspecified urinary incontinence   . Spinal stenosis   . ACE-inhibitor cough   . Angina   . Heart murmur     aS CHILD  . Shortness of breath   . Recurrent upper respiratory infection (URI)   . Anxiety   . Pneumonia   . 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 Myocardial infarct   . Osteoarthrosis, unspecified  whether generalized or localized, unspecified site   . Malignant neoplasm of breast (female), unspecified site   . Compression fracture of T12 vertebra 10/28/2011   Past Surgical History  Procedure Laterality Date  . Mastectomy  1987    Right  . Breast reconstruction  1998    Reconstruction   . Reduction mammaplasty  1998    Left  .  Mastoidectomy  childhood  . Appendectomy  1948  . Lumbar laminectomy  2003  . Shoulder surgery  02/2005    Bilateral fractures with multiple surgeries  . Breast implant removal  06/12/09    right  . Abdominal hysterectomy    . Fracture surgery      fracture right elbow  . Coronary angioplasty  11/12    distal RCA  . Mastectomy    . Left heart catheterization with coronary angiogram N/A 06/05/2011    Procedure: LEFT HEART CATHETERIZATION WITH CORONARY ANGIOGRAM;  Surgeon: Clent Demark, MD;  Location: [facility] C[facility] LAB;  Service: Cardiovascular;  Laterality: N/A;  . Left heart catheterization with coronary angiogram N/A 03/05/2014    Procedure: LEFT HEART CATHETERIZATION WITH CORONARY ANGIOGRAM;  Surgeon: Clent Demark, MD;  Location: [facility] CATH LAB;  Service: Cardiovascular;  Laterality: N/A;  . Flexible sigmoidoscopy N/A 01/21/2015    Procedure: FLEXIBLE SIGMOIDOSCOPY;  Surgeon: Richmond Campbell, MD;  Location: [facility] ENDOSCOPY;  Service: Endoscopy;  Laterality: N/A;  . Flexible sigmoidoscopy  01/22/2015       . Cardiac catheterization N/A 01/25/2015    Procedure: Left Heart Cath and Coronary Angiography;  Surgeon: Charolette Forward, MD;  Location: [facility] CV LAB;  Service: Cardiovascular;  Laterality: N/A;   Social History:   reports that she quit smoking about 40 years ago. She has never used smokeless tobacco. She reports that she drinks alcohol. She reports that she does not use illicit drugs.  Family History  Problem Relation Age of Onset  . Heart attack Father 21  . Cancer Mother     ovarian  . Cancer Brother     lung  . Arthritis      family    Medications:   Medication List       This list is accurate as of: 01/29/15  1:06 PM.  Always use your most recent med list.               acetaminophen 325 MG tablet  Commonly known as:  TYLENOL  Take 2 tablets (650 mg total) by mouth every 4 (four) hours as needed for headache or mild pain.     albuterol (2.5 MG/3ML) 0.083%  nebulizer solution  Commonly known as:  PROVENTIL  Take 3 mLs (2.5 mg total) by nebulization every 6 (six) hours as needed for wheezing or shortness of breath.     aspirin 81 MG tablet  Take 81 mg by mouth daily after lunch.     CALTRATE 600+D PO  Take 1 tablet by mouth daily after lunch.     carvedilol 12.5 MG tablet  Commonly known as:  COREG  Take 1 tablet (12.5 mg total) by mouth 2 (two) times daily with a meal.     cetirizine 10 MG tablet  Commonly known as:  ZYRTEC  Take 10 mg by mouth daily. For allergies     ciprofloxacin 500 MG tablet  Commonly known as:  CIPRO  Take 1 tablet (500 mg total) by mouth 2 (two) times daily. To continue till seen by Dr. Earlean Shawl.  denosumab 60 MG/ML Soln injection  Commonly known as:  PROLIA  Inject 60 mg into the skin every 6 (six) months. Administer in upper arm, thigh, or abdomen     escitalopram 10 MG tablet  Commonly known as:  LEXAPRO  Take 1 tablet (10 mg total) by mouth daily.     famotidine 20 MG tablet  Commonly known as:  PEPCID  Take 1 tablet (20 mg total) by mouth 2 (two) times daily.     fluticasone 50 MCG/ACT nasal spray  Commonly known as:  FLONASE  Place 2 sprays into both nostrils daily.     furosemide 40 MG tablet  Commonly known as:  LASIX  Take 1 tablet (40 mg total) by mouth daily.     gabapentin 100 MG capsule  Commonly known as:  NEURONTIN  Take 1 capsule (100 mg total) by mouth 2 (two) times daily.     losartan 50 MG tablet  Commonly known as:  COZAAR  TAKE 1 TABLET BY MOUTH DAILY     montelukast 10 MG tablet  Commonly known as:  SINGULAIR  TAKE 1 TABLET BY MOUTH DAILY     multivitamin with minerals Tabs tablet  Take 1 tablet by mouth daily after lunch.     oxyCODONE-acetaminophen 5-325 MG per tablet  Commonly known as:  PERCOCET/ROXICET  Take 1-2 tablets by mouth every 4 (four) hours as needed.     pravastatin 80 MG tablet  Commonly known as:  PRAVACHOL  Take 1 tablet by mouth  daily      spironolactone 25 MG tablet  Commonly known as:  ALDACTONE  Take 1 tablet (25 mg total) by mouth daily.     vitamin B-12 500 MCG tablet  Commonly known as:  CYANOCOBALAMIN  Take 1,000 mcg by mouth 3 (three) times a week. Mon, Wed, Fri         Physical Exam:  Danley Danker Vitals:   01/29/15 1305  BP: 150/90  Pulse: 87  Temp: 98.2 F (36.8 C)  Resp: 18  SpO2: 95%    General- elderly female, in no acute distress Head- normocephalic, atraumatic Throat- moist mucus membrane Eyes- PERRLA, EOMI, no pallor, no icterus, no discharge, normal conjunctiva, normal sclera Neck- no cervical lymphadenopathy, no jugular vein distension Cardiovascular- normal s1,s2, no murmurs, palpable dorsalis pedis and radial pulses, trace leg edema Respiratory- bilateral poor air entry, no wheeze, no rhonchi, no crackles, no use of accessory muscles Abdomen- bowel sounds present, soft, non tender Musculoskeletal- able to move all 4 extremities, generalized weakness, kyphosis present Neurological- no focal deficit, alert and oriented  Skin- warm and dry Psychiatry- normal mood and affect    Labs reviewed: Basic Metabolic Panel:  Recent Labs  03/04/14 0306  10/02/14 1213  01/18/15 2333  01/21/15 0548  01/25/15 0554 01/26/15 0538 01/27/15 0421  NA 132*  < > 137  < >  --   < > 136  < > 132* 133* 130*  K 3.8  < > 4.8  < >  --   < > 4.1  < > 4.1 3.8 4.1  CL 93*  < > 102  < >  --   < > 106  < > 97* 99* 95*  CO2 26  < > 31  --   --   < > 26  < > 26 27 26   GLUCOSE 126*  < > 108*  < >  --   < > 108*  < > 110* 113* 109*  BUN 44*  < > 39*  < >  --   < > 9  < > 10 14 17   CREATININE 1.14*  < > 1.35*  < >  --   < > 0.78  < > 0.91 1.05* 1.03*  CALCIUM 8.1*  < > 9.4  --   --   < > 7.0*  < > 7.3* 7.5* 7.6*  MG 1.8  --   --   --  2.4  --  2.3  --   --   --   --   PHOS 3.2  --  4.4  --  2.7  --   --   --   --   --   --   < > = values in this interval not displayed. Liver Function Tests:  Recent Labs   01/21/15 0548 01/22/15 0406 01/24/15 0527  AST 15 16 18   ALT 10* 12* 12*  ALKPHOS 39 42 44  BILITOT 0.6 0.6 0.5  PROT 5.5* 5.8* 5.6*  ALBUMIN 2.8* 3.1* 3.0*    Recent Labs  01/18/15 1228  LIPASE 20*   No results for input(s): AMMONIA in the last 8760 hours. CBC:  Recent Labs  04/03/14 0749 01/18/15 1228  01/21/15 0548  01/25/15 0554 01/26/15 0538 01/27/15 0421  WBC 6.6 8.7  < > 6.4  < > 8.3 6.8 7.5  NEUTROABS 4.8 7.8*  --  4.5  --   --   --   --   HGB 10.0* 12.1  < > 8.8*  < > 10.1* 10.3* 10.5*  HCT 29.7* 35.3*  < > 27.1*  < > 29.8* 30.4* 31.2*  MCV 98.5 94.6  < > 100.0  < > 97.1 95.9 97.2  PLT 251.0 238  < > 149*  < > 147* 183 196  < > = values in this interval not displayed. Cardiac Enzymes:  Recent Labs  01/20/15 2015 01/21/15 0548 01/23/15 0535  TROPONINI 0.07* 0.05* 0.04*   BNP: Invalid input(s): POCBNP CBG:  Recent Labs  01/18/15 2033 01/18/15 2352 01/19/15 0450  GLUCAP 127* 126* 131*    Radiological Exams: Dg Chest 2 View  01/18/2015   CLINICAL DATA:  Increasing shortness of breath with exertion  EXAM: CHEST  2 VIEW  COMPARISON:  March 03, 2014  FINDINGS: There is mild scarring in the left base region. There is no edema or consolidation. Heart is upper normal in size with pulmonary vascularity within normal limits. There is atherosclerotic change in the aorta. No adenopathy.  There are wedge compression fractures in the mid and lower thoracic spine with increase in kyphosis. There is evidence of old fractures of each proximal humeral metaphysis, stable. There is calcification in the left carotid artery.  IMPRESSION: Scarring left base. No edema or consolidation. No change in cardiac silhouette. Old fractures as noted. Increase in thoracic kyphosis. Calcification left carotid artery.   Electronically Signed   By: Lowella Grip III M.D.   On: 01/18/2015 14:01   Ct Chest Wo Contrast  01/19/2015   CLINICAL DATA:  Sigmoid colon mass, possible  adenocarcinoma, evaluate for metastasis  EXAM: CT CHEST WITHOUT CONTRAST  TECHNIQUE: Multidetector CT imaging of the chest was performed following the standard protocol without IV contrast.  COMPARISON:  Chest radiographs dated 01/18/2015  FINDINGS: Mediastinum/Nodes: The heart is top-normal in size. Trace pleural fluid.  Coronary atherosclerosis.  Atherosclerotic calcifications of the aortic arch.  No suspicious mediastinal or axillary lymphadenopathy.  Visualized thyroid is unremarkable.  Lungs/Pleura: Negative mild patchy bibasilar opacities, likely atelectasis. No focal consolidation.  Trace bilateral pleural effusions.  No suspicious pulmonary nodules.  No pneumothorax.  Upper abdomen: Enteric tube coursing into the proximal stomach. Visualized upper abdomen is otherwise unchanged from recent CT, noting a 3.0 cm right adrenal nodule/mass.  Musculoskeletal: Severe compression fracture deformities at T6 and T12. Moderate compression fracture deformity at T8.  IMPRESSION: No evidence of metastatic disease in the chest.  Trace bilateral pleural effusions.  Severe compression fracture deformities at T6 and T12. Moderate compression fracture deformity at T8.   Electronically Signed   By: Julian Hy M.D.   On: 01/19/2015 15:19   Mr Abdomen W Wo Contrast  01/20/2015   CLINICAL DATA:  Indeterminate right adrenal mass seen on recent CT. Distal colonic obstruction due to colon carcinoma.  EXAM: MRI ABDOMEN WITHOUT AND WITH CONTRAST  TECHNIQUE: Multiplanar multisequence MR imaging of the abdomen was performed both before and after the administration of intravenous contrast.  CONTRAST:  73mL MULTIHANCE GADOBENATE DIMEGLUMINE 529 MG/ML IV SOLN  COMPARISON:  AP CT on 01/18/2015  FINDINGS: Lower chest: No acute findings.  Hepatobiliary: A few tiny cysts scattered sub-cm hepatic cysts are noted, however no liver masses are identified.  Pancreas: No masses, inflammatory changes, or fluid collections demonstrated.   Spleen:  Within normal limits in size and appearance.  Adrenal Glands: 3.0 cm right adrenal mass is seen which shows peripheral enhancement and central cystic foci. No macroscopic fat is seen. No evidence of signal dropout on chemical shift imaging. These features are nonspecific. Left adrenal gland is normal in appearance.  Kidneys: No renal masses identified. No evidence of hydronephrosis.  Stomach/Bowel/Peritoneum: Mild dilatation of colonic bowel loops within abdomen again noted.  Vascular/Lymphatic: No pathologically enlarged lymph nodes identified. No abdominal aortic aneurysm or other significant retroperitoneal abnormality demonstrated.  Other:  None.  Musculoskeletal:  No suspicious bone lesions identified.  IMPRESSION: 3.0 cm right adrenal mass has nonspecific MR characteristics. Differential diagnosis still includes atypical adrenal adenoma, with adrenal metastasis considered less likely. Consider PET-CT scan for further evaluation, or continued followup by CT in 6 months.   Electronically Signed   By: Earle Gell M.D.   On: 01/20/2015 09:28   Ct Abdomen Pelvis W Contrast  01/18/2015   CLINICAL DATA:  Lower abdominal pain, swelling, and cramping since yesterday.  EXAM: CT ABDOMEN AND PELVIS WITH CONTRAST  TECHNIQUE: Multidetector CT imaging of the abdomen and pelvis was performed using the standard protocol following bolus administration of intravenous contrast.  CONTRAST:  40mL OMNIPAQUE IOHEXOL 300 MG/ML  SOLN  COMPARISON:  None.  FINDINGS: Lower Chest: Cardiomegaly and mild dependent bibasilar atelectasis noted.  Hepatobiliary: Probable tiny sub-cm hepatic cysts noted, but no definite liver masses are identified. Gallbladder is unremarkable.  Pancreas: No mass, inflammatory changes, or other significant abnormality identified.  Spleen:  Within normal limits in size and appearance.  Adrenals: 3.1 cm right adrenal mass is seen which has nonspecific features and enhancement characteristics.   Kidneys/Urinary Tract:  No evidence of masses or hydronephrosis.  Stomach/Bowel/Peritoneum: Small hiatal hernia noted. There is diffuse colonic dilatation. Abrupt transition with shoulder ring is seen in the mid sigmoid colon where there is evidence of annular colonic wall thickening measuring approximately 3.9 cm in length. This is highly suspicious for obstructing colon carcinoma. Sigmoid diverticulosis is demonstrated, however there is no evidence of diverticulitis. No evidence of small bowel obstruction.  Vascular/Lymphatic: No pathologically enlarged lymph nodes identified. 8 mm left common  iliac lymph node noted on image 49/ series 201, which is not considered pathologically enlarged. No abdominal aortic aneurysm or other significant retroperitoneal abnormality demonstrated.  Reproductive: Prior hysterectomy noted. Adnexal regions are unremarkable in appearance. Tiny amount of free fluid seen in the dependent pelvis.  Other:  None.  Musculoskeletal: No suspicious bone lesions identified. Lower lumbar spine fusion hardware and old T12 vertebral body compression fracture noted.  IMPRESSION: Distal colonic obstruction in the mid sigmoid colon, with appearance highly suspicious for obstructing colon adenocarcinoma. Sigmoidoscopy or colonoscopy recommended for further evaluation.  Sigmoid diverticulosis. No radiographic evidence of diverticulitis.  No definite evidence of metastatic disease within the abdomen or pelvis.  3.1 cm indeterminate right adrenal mass and probable tiny hepatic cysts. Abdomen MRI without and with contrast recommended for further evaluation.   Electronically Signed   By: Earle Gell M.D.   On: 01/18/2015 15:15   Dg Abd Portable 1v  01/18/2015   CLINICAL DATA:  Nausea and abdominal pain since yesterday.  EXAM: PORTABLE ABDOMEN - 1 VIEW  COMPARISON:  CT abdomen and pelvis from the same day.  FINDINGS: NG tube is in place. Moderate distention of the colon per cysts, similar to the topogram of  the CT scan. There is no definite free air. Contrast can be seen within the distal small bowel. Surgical changes are noted in the lumbar spine. Atherosclerotic changes are present in the aorta.  IMPRESSION: Similar appearance of distal colonic obstruction.  NG tube in place.   Electronically Signed   By: San Morelle M.D.   On: 01/18/2015 16:57     Assessment/Plan  Physical deconditioning Will have her work with physical therapy and occupational therapy team to help with gait training and muscle strengthening exercises.fall precautions. Skin care. Encourage to be out of bed.   Diverticulitis Stable clinically, continue ciprofloxacin 500 mg bid for now until gi follow up. Encouraged to take yoghurt everyday, pt willing to. Monitor clinically  Blood loss anemia Hb 10.5 at discharge, monitor cbc  Hyponatremia On lasix, monitor bmp given na 130 at discharge  CAD remains chest pain free. S/p catheterization this admission showing patent stent. Continue coreg 12.5 mg bid, cozaar 50 mg daily, aspirin 81 mg daily and pravastatin 80 mg daily. Off plavix.   CHF euvolemic on exam, continue cozaar 50 mg daily, spironolactone 25 mg daily, lasix 40 mg daily and coreg 12.5 mg bid. Monitor weight  HTN Elevated bp reading, continue cozaar 50 mg daily with coreg, aldactone and lasix, check bp q shift, if bp remains elevated, medication dosage adjustment to be done  ckd stage 3 Monitor renal function  Right adrenal mass Incidental finding, repeat ct scan recommended in 6 month for follow up, monitor  RLS Continue neurontin 100 mg bid, stable at present  Allergic rhinitis Continue singulair 10 mg daily, zyrtec and flonase  Osteoporosis Continue prolia q6 month and caltrate  Knee OA Continue oxycodone apap 5/325 1-2 tab q4h prn pain, to work with therapy team  gerd Stable, continue pepcid 20 mg bid  Depression  stable mood, continue lexapro 10 mg daily    Goals of care: short  term rehabilitation   Labs/tests ordered: cbc with diff, cmp 1 week  Family/ staff Communication: reviewed care plan with patient and nursing supervisor    Blanchie Serve, MD  [facility] [phone] (Monday-Friday 8 am - 5 pm) [phone] (afterhours)

## 2015-02-06 ENCOUNTER — Non-Acute Institutional Stay (SKILLED_NURSING_FACILITY): Payer: Medicare Other | Admitting: Internal Medicine

## 2015-02-06 DIAGNOSIS — D62 Acute posthemorrhagic anemia: Secondary | ICD-10-CM | POA: Diagnosis not present

## 2015-02-06 DIAGNOSIS — R600 Localized edema: Secondary | ICD-10-CM | POA: Diagnosis not present

## 2015-02-06 DIAGNOSIS — N183 Chronic kidney disease, stage 3 (moderate): Secondary | ICD-10-CM

## 2015-02-06 DIAGNOSIS — I509 Heart failure, unspecified: Secondary | ICD-10-CM | POA: Diagnosis not present

## 2015-02-06 NOTE — Progress Notes (Signed)
Patient ID: Asa Saunas, female   DOB: [birth year], 81 y.o.   MRN: [id number]    Facility: [facility]    PCP: [name], MD  Code Status: full code  Allergies  Allergen Reactions  . Amoxicillin-Pot Clavulanate Anaphylaxis  . Cephalexin Other (See Comments)    Reaction unknown  . Clarithromycin Other (See Comments)    Reaction unknown  . Lidocaine   . Nifedipine Other (See Comments)    Reaction unknown  . Nsaids Other (See Comments)    Heart issue  . Olmesartan Medoxomil     REACTION: cough;  But tolerating Losartan 02/2014  . Penicillins   . Tramadol Hcl Other (See Comments)    Reaction unknown    Chief Complaint  Patient presents with  . Acute Visit    weight gain and increased leg swelling     HPI:  81 y.o. patient is here for short term rehabilitation post abdominal pain from colonic obstruction found to be from acute diverticulitis on sigmoidoscopy and chf exacerbation. She has been working with therapy team.She has PMH of CAD, CKD stage 3, COPD, CHF among others. She is seen in her room today with concern of weight gain. She has gained 13 lb since admission to the facility. There has been increased swelling to her feet and both her legs. She denies any worsening of her breathing. She is using her nebulizer tretament and lasix. denies any worsening orthopnea. She was noted to have drop in hb on lab for follow up post hospitalization. Reviewed repeat cbc from today  Review of Systems:  Constitutional:  Negative for fever, chills, diaphoresis.  HENT: Negative for headache, congestion. Has some nasal discharge and occasional headache with her allergies. Eyes: Negative for eye pain, blurred vision, double vision Respiratory: Negative for cough, wheezing or worsening dyspnea Cardiovascular: Negative for chest pain, palpitations  Gastrointestinal: Negative for heartburn, nausea, vomiting, abdominal pain. Denies rectal bleed or  melena Genitourinary: Negative for dysuria Musculoskeletal: Negative for back pain, falls in facility. Has knee OA  Skin: Negative for itching, rash.  Neurological: Negative for dizziness, tingling, focal weakness Psychiatric/Behavioral: Negative for depression   Past Medical History  Diagnosis Date  . Allergic rhinitis, cause unspecified   . Unspecified cardiovascular disease   . Personal history of malignant neoplasm of breast   . Occlusion and stenosis of carotid artery without mention of cerebral infarction   . Chronic airway obstruction, not elsewhere classified   . Depressive disorder, not elsewhere classified   . Other dyspnea and respiratory abnormality   . Esophageal reflux   . Unspecified hearing loss   . Other and unspecified hyperlipidemia   . Unspecified essential hypertension   . Osteoporosis, unspecified   . Peripheral vascular disease, unspecified   . Unspecified urinary incontinence   . Spinal stenosis   . ACE-inhibitor cough   . Angina   . Heart murmur     aS CHILD  . Shortness of breath   . Recurrent upper respiratory infection (URI)   . Anxiety   . Pneumonia   . 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 Myocardial infarct   . Osteoarthrosis, unspecified whether generalized or localized, unspecified site   . Malignant neoplasm of breast (female), unspecified site   . Compression fracture of T12 vertebra 10/28/2011   Past Surgical History  Procedure Laterality Date  . Mastectomy  1987    Right  . Breast reconstruction  1998  Reconstruction   . Reduction mammaplasty  1998    Left  . Mastoidectomy  childhood  . Appendectomy  1948  . Lumbar laminectomy  2003  . Shoulder surgery  02/2005    Bilateral fractures with multiple surgeries  . Breast implant removal  06/12/09    right  . Abdominal hysterectomy    . Fracture surgery      fracture right elbow  . Coronary angioplasty  11/12    distal RCA  . Mastectomy    . Left heart  catheterization with coronary angiogram N/A 06/05/2011    Procedure: LEFT HEART CATHETERIZATION WITH CORONARY ANGIOGRAM;  Surgeon: Clent Demark, MD;  Location: [facility] CATH LAB;  Service: Cardiovascular;  Laterality: N/A;  . Left heart catheterization with coronary angiogram N/A 03/05/2014    Procedure: LEFT HEART CATHETERIZATION WITH CORONARY ANGIOGRAM;  Surgeon: Clent Demark, MD;  Location: [facility] CATH LAB;  Service: Cardiovascular;  Laterality: N/A;  . Flexible sigmoidoscopy N/A 01/21/2015    Procedure: FLEXIBLE SIGMOIDOSCOPY;  Surgeon: Richmond Campbell, MD;  Location: [facility] ENDOSCOPY;  Service: Endoscopy;  Laterality: N/A;  . Flexible sigmoidoscopy  01/22/2015       . Cardiac catheterization N/A 01/25/2015    Procedure: Left Heart Cath and Coronary Angiography;  Surgeon: Charolette Forward, MD;  Location: [place] CV LAB;  Service: Cardiovascular;  Laterality: N/A;   Social History:   reports that she quit smoking about 40 years ago. She has never used smokeless tobacco. She reports that she drinks alcohol. She reports that she does not use illicit drugs.  Family History  Problem Relation Age of Onset  . Heart attack Father 68  . Cancer Mother     ovarian  . Cancer Brother     lung  . Arthritis      family    Medications:   Medication List       This list is accurate as of: 02/06/15 12:17 PM.  Always use your most recent med list.               acetaminophen 325 MG tablet  Commonly known as:  TYLENOL  Take 2 tablets (650 mg total) by mouth every 4 (four) hours as needed for headache or mild pain.     albuterol (2.5 MG/3ML) 0.083% nebulizer solution  Commonly known as:  PROVENTIL  Take 3 mLs (2.5 mg total) by nebulization every 6 (six) hours as needed for wheezing or shortness of breath.     aspirin 81 MG tablet  Take 81 mg by mouth daily after lunch.     CALTRATE 600+D PO  Take 1 tablet by mouth daily after lunch.     carvedilol 12.5 MG tablet  Commonly known as:  COREG  Take  1 tablet (12.5 mg total) by mouth 2 (two) times daily with a meal.     cetirizine 10 MG tablet  Commonly known as:  ZYRTEC  Take 10 mg by mouth daily. For allergies     ciprofloxacin 500 MG tablet  Commonly known as:  CIPRO  Take 1 tablet (500 mg total) by mouth 2 (two) times daily. To continue till seen by Dr. Earlean Shawl.     denosumab 60 MG/ML Soln injection  Commonly known as:  PROLIA  Inject 60 mg into the skin every 6 (six) months. Administer in upper arm, thigh, or abdomen     escitalopram 10 MG tablet  Commonly known as:  LEXAPRO  Take 1 tablet (10 mg total) by mouth  daily.     famotidine 20 MG tablet  Commonly known as:  PEPCID  Take 1 tablet (20 mg total) by mouth 2 (two) times daily.     fluticasone 50 MCG/ACT nasal spray  Commonly known as:  FLONASE  Place 2 sprays into both nostrils daily.     furosemide 40 MG tablet  Commonly known as:  LASIX  Take 1 tablet (40 mg total) by mouth daily.     gabapentin 100 MG capsule  Commonly known as:  NEURONTIN  Take 1 capsule (100 mg total) by mouth 2 (two) times daily.     losartan 50 MG tablet  Commonly known as:  COZAAR  TAKE 1 TABLET BY MOUTH DAILY     montelukast 10 MG tablet  Commonly known as:  SINGULAIR  TAKE 1 TABLET BY MOUTH DAILY     multivitamin with minerals Tabs tablet  Take 1 tablet by mouth daily after lunch.     oxyCODONE-acetaminophen 5-325 MG per tablet  Commonly known as:  PERCOCET/ROXICET  Take 1-2 tablets by mouth every 4 (four) hours as needed.     pravastatin 80 MG tablet  Commonly known as:  PRAVACHOL  Take 1 tablet by mouth  daily     spironolactone 25 MG tablet  Commonly known as:  ALDACTONE  Take 1 tablet (25 mg total) by mouth daily.     vitamin B-12 500 MCG tablet  Commonly known as:  CYANOCOBALAMIN  Take 1,000 mcg by mouth 3 (three) times a week. Mon, Wed, Fri         Physical Exam:  Filed Vitals:   02/06/15 1216  BP: 130/90  Pulse: 82  Temp: 99 F (37.2 C)  Resp: 16    Weight: 184 lb 9.6 oz (83.734 kg)   Wt Readings from Last 3 Encounters:  02/06/15 184 lb 9.6 oz (83.734 kg)  01/28/15 170 lb (77.111 kg)  12/18/14 171 lb (77.565 kg)   General- elderly female, in no acute distress Head- normocephalic, atraumatic Throat- moist mucus membrane Eyes- PERRLA, EOMI, no pallor, no icterus, no discharge, normal conjunctiva, normal sclera Neck- no cervical lymphadenopathy, no jugular vein distension Cardiovascular- normal s1,s2, no murmurs, palpable dorsalis pedis and radial pulses, 2+ pitting leg edema Respiratory- bilateral poor air entry, no wheeze, no rhonchi, no crackles, no use of accessory muscles Abdomen- bowel sounds present, soft, non tender Musculoskeletal- able to move all 4 extremities, generalized weakness, kyphosis present Neurological- no focal deficit, alert and oriented  Skin- warm and dry, erythema to both legs Psychiatry- normal mood and affect    Labs reviewed: Basic Metabolic Panel:  Recent Labs  03/04/14 0306  10/02/14 1213  01/18/15 2333  01/21/15 0548  01/25/15 0554 01/26/15 0538 01/27/15 0421  NA 132*  < > 137  < >  --   < > 136  < > 132* 133* 130*  K 3.8  < > 4.8  < >  --   < > 4.1  < > 4.1 3.8 4.1  CL 93*  < > 102  < >  --   < > 106  < > 97* 99* 95*  CO2 26  < > 31  --   --   < > 26  < > 26 27 26   GLUCOSE 126*  < > 108*  < >  --   < > 108*  < > 110* 113* 109*  BUN 44*  < > 39*  < >  --   < >  9  < > 10 14 17   CREATININE 1.14*  < > 1.35*  < >  --   < > 0.78  < > 0.91 1.05* 1.03*  CALCIUM 8.1*  < > 9.4  --   --   < > 7.0*  < > 7.3* 7.5* 7.6*  MG 1.8  --   --   --  2.4  --  2.3  --   --   --   --   PHOS 3.2  --  4.4  --  2.7  --   --   --   --   --   --   < > = values in this interval not displayed. Liver Function Tests:  Recent Labs  01/21/15 0548 01/22/15 0406 01/24/15 0527  AST 15 16 18   ALT 10* 12* 12*  ALKPHOS 39 42 44  BILITOT 0.6 0.6 0.5  PROT 5.5* 5.8* 5.6*  ALBUMIN 2.8* 3.1* 3.0*    Recent Labs   01/18/15 1228  LIPASE 20*   No results for input(s): AMMONIA in the last 8760 hours. CBC:  Recent Labs  04/03/14 0749 01/18/15 1228  01/21/15 0548  01/25/15 0554 01/26/15 0538 01/27/15 0421  WBC 6.6 8.7  < > 6.4  < > 8.3 6.8 7.5  NEUTROABS 4.8 7.8*  --  4.5  --   --   --   --   HGB 10.0* 12.1  < > 8.8*  < > 10.1* 10.3* 10.5*  HCT 29.7* 35.3*  < > 27.1*  < > 29.8* 30.4* 31.2*  MCV 98.5 94.6  < > 100.0  < > 97.1 95.9 97.2  PLT 251.0 238  < > 149*  < > 147* 183 196  < > = values in this interval not displayed. Cardiac Enzymes:  Recent Labs  01/20/15 2015 01/21/15 0548 01/23/15 0535  TROPONINI 0.07* 0.05* 0.04*   BNP: Invalid input(s): POCBNP CBG:  Recent Labs  01/18/15 2033 01/18/15 2352 01/19/15 0450  GLUCAP 127* 126* 131*   02/05/15 hb 8.4, hct 25.3, na 136, k 4.5, glu 105, bun 27, cr 1.35 02/06/15 hb 8.1, hct 24.7, plt 262, na 133, k 4.5, bun 26, cr 1.31  Assessment/Plan  Leg edema Worsened with > 10 lb weight gain over a week. Increase lasix to 60 mg bid for now for next 3 days, check bmp 02/08/15. Has cardiology f/u on 7/29 as well. Keep legs elevated at rest and add ted stockings  CHF Hypervolemic on exam with increased edema. Increase lasix to 60 mg bid x 3 days, check daily weight and cmp on 02/08/15. Continue coreg 12.5 mg bid, spironolactone 25 mg daily and cozaar 50 mg daily, spironolactone 25 mg daily.  Blood loss anemia worsened from hospital stay, had diverticular bleed in hospital and did not receive transfusion in hospital. Guaiac stool now, hold aspirin for now. Recheck cbc 7/29. Start protonix 40 mg bid for now. Will need transfusion and further GI workup if guaiac positive or Hb drops further  ckd stage 3 Monitor renal function with increase in dosing of lasix   Of note plan was for patient to be discharged on 02/09/15. I have spoken with therapy team and social worker to postpone her discharge plan for now.    Labs/tests ordered: cbc and cmp  02/08/15  Family/ staff Communication: reviewed care plan with patient and nursing supervisor    Blanchie Serve, MD  [facility] [phone] (Monday-Friday 8 am - 5 pm) [phone] (afterhours)

## 2015-02-08 DIAGNOSIS — I2510 Atherosclerotic heart disease of native coronary artery without angina pectoris: Secondary | ICD-10-CM | POA: Diagnosis not present

## 2015-02-08 DIAGNOSIS — I10 Essential (primary) hypertension: Secondary | ICD-10-CM | POA: Diagnosis not present

## 2015-02-08 DIAGNOSIS — D649 Anemia, unspecified: Secondary | ICD-10-CM | POA: Diagnosis not present

## 2015-02-08 DIAGNOSIS — I5040 Unspecified combined systolic (congestive) and diastolic (congestive) heart failure: Secondary | ICD-10-CM | POA: Diagnosis not present

## 2015-02-08 DIAGNOSIS — I252 Old myocardial infarction: Secondary | ICD-10-CM | POA: Diagnosis not present

## 2015-02-11 ENCOUNTER — Non-Acute Institutional Stay (SKILLED_NURSING_FACILITY): Payer: Medicare Other | Admitting: Nurse Practitioner

## 2015-02-11 DIAGNOSIS — M170 Bilateral primary osteoarthritis of knee: Secondary | ICD-10-CM

## 2015-02-11 DIAGNOSIS — E279 Disorder of adrenal gland, unspecified: Secondary | ICD-10-CM | POA: Diagnosis not present

## 2015-02-11 DIAGNOSIS — E871 Hypo-osmolality and hyponatremia: Secondary | ICD-10-CM

## 2015-02-11 DIAGNOSIS — I2510 Atherosclerotic heart disease of native coronary artery without angina pectoris: Secondary | ICD-10-CM | POA: Diagnosis not present

## 2015-02-11 DIAGNOSIS — N183 Chronic kidney disease, stage 3 unspecified: Secondary | ICD-10-CM

## 2015-02-11 DIAGNOSIS — K5732 Diverticulitis of large intestine without perforation or abscess without bleeding: Secondary | ICD-10-CM | POA: Diagnosis not present

## 2015-02-11 DIAGNOSIS — E278 Other specified disorders of adrenal gland: Secondary | ICD-10-CM

## 2015-02-11 DIAGNOSIS — I10 Essential (primary) hypertension: Secondary | ICD-10-CM

## 2015-02-11 DIAGNOSIS — D62 Acute posthemorrhagic anemia: Secondary | ICD-10-CM

## 2015-02-11 DIAGNOSIS — M25552 Pain in left hip: Secondary | ICD-10-CM | POA: Diagnosis not present

## 2015-02-11 DIAGNOSIS — I5042 Chronic combined systolic (congestive) and diastolic (congestive) heart failure: Secondary | ICD-10-CM | POA: Diagnosis not present

## 2015-02-11 DIAGNOSIS — M7581 Other shoulder lesions, right shoulder: Secondary | ICD-10-CM | POA: Diagnosis not present

## 2015-02-11 DIAGNOSIS — M719 Bursopathy, unspecified: Secondary | ICD-10-CM | POA: Diagnosis not present

## 2015-02-11 NOTE — Progress Notes (Signed)
Patient ID: Haley Harris, female   DOB: [birth year], 81 y.o.   MRN: [id number]    Nursing Home Location:  [place] [facility] Service: SNF (11)  PCP: [name], MD  Allergies  Allergen Reactions  . Amoxicillin-Pot Clavulanate Anaphylaxis  . Cephalexin Other (See Comments)    Reaction unknown  . Clarithromycin Other (See Comments)    Reaction unknown  . Lidocaine   . Nifedipine Other (See Comments)    Reaction unknown  . Nsaids Other (See Comments)    Heart issue  . Olmesartan Medoxomil     REACTION: cough;  But tolerating Losartan 02/2014  . Penicillins   . Tramadol Hcl Other (See Comments)    Reaction unknown    Chief Complaint  Patient presents with  . Discharge Note    HPI:  Patient is a 81 y.o. female seen today at [facility] for discharge home. Pt with a pmh of CAD, CKD stage 3, COPD, CHF who is here at [facility] for short term therapy. Pt hospitalized due to abdominal pain from colonic obstruction found to be from acute diverticulitis on sigmoidoscopy and chf exacerbation. Pt was wanting to leave last week however it was noted she had a 13 lb weight gain and was having increased shortness of breath. Pt was placed on increased lasix for 3 days and  Breathing, weight and edema all improved.  Pt also noted to have worsening anemia- hgb went from 8.4 to 8.1 now back to 8.4. Pt has follow up with GI later this week. Pt also with increased shoulder pain. Found to have bursitis and given cortisone injection.   Review of Systems:  Review of Systems  Constitutional: Negative for activity change, appetite change, fatigue and unexpected weight change.  HENT: Negative for congestion and hearing loss.   Eyes: Negative.   Respiratory: Positive for shortness of breath (chronic, without worsening). Negative for cough.   Cardiovascular: Negative for chest pain, palpitations and leg swelling.  Gastrointestinal: Positive for abdominal pain  (ongoing and stable, no worsening of symptoms). Negative for diarrhea and constipation.  Genitourinary: Negative for dysuria and difficulty urinating.  Musculoskeletal: Positive for arthralgias (OA in bilateral knees, pain to left shoulder). Negative for myalgias.  Skin: Negative for color change and wound.  Neurological: Negative for dizziness and weakness.  Psychiatric/Behavioral: Negative for behavioral problems, confusion and agitation.    Past Medical History  Diagnosis Date  . Allergic rhinitis, cause unspecified   . Unspecified cardiovascular disease   . Personal history of malignant neoplasm of breast   . Occlusion and stenosis of carotid artery without mention of cerebral infarction   . Chronic airway obstruction, not elsewhere classified   . Depressive disorder, not elsewhere classified   . Other dyspnea and respiratory abnormality   . Esophageal reflux   . Unspecified hearing loss   . Other and unspecified hyperlipidemia   . Unspecified essential hypertension   . Osteoporosis, unspecified   . Peripheral vascular disease, unspecified   . Unspecified urinary incontinence   . Spinal stenosis   . ACE-inhibitor cough   . Angina   . Heart murmur     aS CHILD  . Shortness of breath   . Recurrent upper respiratory infection (URI)   . Anxiety   . Pneumonia   . 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 Myocardial infarct   . Osteoarthrosis, unspecified whether generalized or localized, unspecified site   .  Malignant neoplasm of breast (female), unspecified site   . Compression fracture of T12 vertebra 10/28/2011   Past Surgical History  Procedure Laterality Date  . Mastectomy  1987    Right  . Breast reconstruction  1998    Reconstruction   . Reduction mammaplasty  1998    Left  . Mastoidectomy  childhood  . Appendectomy  1948  . Lumbar laminectomy  2003  . Shoulder surgery  02/2005    Bilateral fractures with multiple surgeries  . Breast implant  removal  06/12/09    right  . Abdominal hysterectomy    . Fracture surgery      fracture right elbow  . Coronary angioplasty  11/12    distal RCA  . Mastectomy    . Left heart catheterization with coronary angiogram N/A 06/05/2011    Procedure: LEFT HEART CATHETERIZATION WITH CORONARY ANGIOGRAM;  Surgeon: Clent Demark, MD;  Location: [facility] CATH LAB;  Service: Cardiovascular;  Laterality: N/A;  . Left heart catheterization with coronary angiogram N/A 03/05/2014    Procedure: LEFT HEART CATHETERIZATION WITH CORONARY ANGIOGRAM;  Surgeon: Clent Demark, MD;  Location: [facility] CATH LAB;  Service: Cardiovascular;  Laterality: N/A;  . Flexible sigmoidoscopy N/A 01/21/2015    Procedure: FLEXIBLE SIGMOIDOSCOPY;  Surgeon: Richmond Campbell, MD;  Location: [facility] ENDOSCOPY;  Service: Endoscopy;  Laterality: N/A;  . Flexible sigmoidoscopy  01/22/2015       . Cardiac catheterization N/A 01/25/2015    Procedure: Left Heart Cath and Coronary Angiography;  Surgeon: Charolette Forward, MD;  Location: [place] CV LAB;  Service: Cardiovascular;  Laterality: N/A;   Social History:   reports that she quit smoking about 40 years ago. She has never used smokeless tobacco. She reports that she drinks alcohol. She reports that she does not use illicit drugs.  Family History  Problem Relation Age of Onset  . Heart attack Father 59  . Cancer Mother     ovarian  . Cancer Brother     lung  . Arthritis      family    Medications: Patient's Medications  New Prescriptions   No medications on file  Previous Medications   ACETAMINOPHEN (TYLENOL) 325 MG TABLET    Take 2 tablets (650 mg total) by mouth every 4 (four) hours as needed for headache or mild pain.   ALBUTEROL (PROVENTIL) (2.5 MG/3ML) 0.083% NEBULIZER SOLUTION    Take 3 mLs (2.5 mg total) by nebulization every 6 (six) hours as needed for wheezing or shortness of breath.   ASPIRIN 81 MG TABLET    Take 81 mg by mouth daily after lunch.    CALCIUM CARBONATE-VITAMIN D  (CALTRATE 600+D PO)    Take 1 tablet by mouth daily after lunch.    CARVEDILOL (COREG) 12.5 MG TABLET    Take 1 tablet (12.5 mg total) by mouth 2 (two) times daily with a meal.   CETIRIZINE (ZYRTEC) 10 MG TABLET    Take 10 mg by mouth daily. For allergies   CIPROFLOXACIN (CIPRO) 500 MG TABLET    Take 1 tablet (500 mg total) by mouth 2 (two) times daily. To continue till seen by Dr. Earlean Shawl.   DENOSUMAB (PROLIA) 60 MG/ML SOLN INJECTION    Inject 60 mg into the skin every 6 (six) months. Administer in upper arm, thigh, or abdomen   ESCITALOPRAM (LEXAPRO) 10 MG TABLET    Take 1 tablet (10 mg total) by mouth daily.   FAMOTIDINE (PEPCID) 20 MG TABLET  Take 1 tablet (20 mg total) by mouth 2 (two) times daily.   FLUTICASONE (FLONASE) 50 MCG/ACT NASAL SPRAY    Place 2 sprays into both nostrils daily.   FUROSEMIDE (LASIX) 40 MG TABLET    Take 1 tablet (40 mg total) by mouth daily.   GABAPENTIN (NEURONTIN) 100 MG CAPSULE    Take 1 capsule (100 mg total) by mouth 2 (two) times daily.   LOSARTAN (COZAAR) 50 MG TABLET    TAKE 1 TABLET BY MOUTH DAILY   MONTELUKAST (SINGULAIR) 10 MG TABLET    TAKE 1 TABLET BY MOUTH DAILY   MULTIPLE VITAMIN (MULITIVITAMIN WITH MINERALS) TABS    Take 1 tablet by mouth daily after lunch.    OXYCODONE-ACETAMINOPHEN (PERCOCET/ROXICET) 5-325 MG PER TABLET    Take 1-2 tablets by mouth every 4 (four) hours as needed.   PRAVASTATIN (PRAVACHOL) 80 MG TABLET    Take 1 tablet by mouth  daily   SPIRONOLACTONE (ALDACTONE) 25 MG TABLET    Take 1 tablet (25 mg total) by mouth daily.   VITAMIN B-12 (CYANOCOBALAMIN) 500 MCG TABLET    Take 1,000 mcg by mouth 3 (three) times a week. Mon, Wed, Fri  Modified Medications   No medications on file  Discontinued Medications   No medications on file     Physical Exam: Filed Vitals:   02/11/15 1524  BP: 120/67  Pulse: 65  Temp: 97.1 F (36.2 C)  Resp: 20    Physical Exam  Constitutional: She is oriented to person, place, and time. She  appears well-nourished. No distress.  HENT:  Head: Normocephalic and atraumatic.  Mouth/Throat: Oropharynx is clear and moist. No oropharyngeal exudate.  Eyes: Conjunctivae are normal. Pupils are equal, round, and reactive to light.  Neck: Normal range of motion. Neck supple.  Cardiovascular: Normal rate, regular rhythm and normal heart sounds.   Pulmonary/Chest: Effort normal and breath sounds normal.  Chronic o2  Abdominal: Soft. Bowel sounds are normal.  Musculoskeletal: She exhibits no edema or tenderness.  Neurological: She is alert and oriented to person, place, and time.  Skin: Skin is warm and dry. She is not diaphoretic.  Psychiatric: She has a normal mood and affect.    Labs reviewed: Basic Metabolic Panel:  Recent Labs  03/04/14 0306  10/02/14 1213  01/18/15 2333  01/21/15 0548  01/25/15 0554 01/26/15 0538 01/27/15 0421  NA 132*  < > 137  < >  --   < > 136  < > 132* 133* 130*  K 3.8  < > 4.8  < >  --   < > 4.1  < > 4.1 3.8 4.1  CL 93*  < > 102  < >  --   < > 106  < > 97* 99* 95*  CO2 26  < > 31  --   --   < > 26  < > 26 27 26   GLUCOSE 126*  < > 108*  < >  --   < > 108*  < > 110* 113* 109*  BUN 44*  < > 39*  < >  --   < > 9  < > 10 14 17   CREATININE 1.14*  < > 1.35*  < >  --   < > 0.78  < > 0.91 1.05* 1.03*  CALCIUM 8.1*  < > 9.4  --   --   < > 7.0*  < > 7.3* 7.5* 7.6*  MG 1.8  --   --   --  2.4  --  2.3  --   --   --   --   PHOS 3.2  --  4.4  --  2.7  --   --   --   --   --   --   < > = values in this interval not displayed. Liver Function Tests:  Recent Labs  01/21/15 0548 01/22/15 0406 01/24/15 0527  AST 15 16 18   ALT 10* 12* 12*  ALKPHOS 39 42 44  BILITOT 0.6 0.6 0.5  PROT 5.5* 5.8* 5.6*  ALBUMIN 2.8* 3.1* 3.0*    Recent Labs  01/18/15 1228  LIPASE 20*   No results for input(s): AMMONIA in the last 8760 hours. CBC:  Recent Labs  04/03/14 0749 01/18/15 1228  01/21/15 0548  01/25/15 0554 01/26/15 0538 01/27/15 0421  WBC 6.6 8.7  < >  6.4  < > 8.3 6.8 7.5  NEUTROABS 4.8 7.8*  --  4.5  --   --   --   --   HGB 10.0* 12.1  < > 8.8*  < > 10.1* 10.3* 10.5*  HCT 29.7* 35.3*  < > 27.1*  < > 29.8* 30.4* 31.2*  MCV 98.5 94.6  < > 100.0  < > 97.1 95.9 97.2  PLT 251.0 238  < > 149*  < > 147* 183 196  < > = values in this interval not displayed. TSH: No results for input(s): TSH in the last 8760 hours. A1C: No results found for: HGBA1C Lipid Panel:  Recent Labs  04/03/14 0749  CHOL 121  HDL 50.60  LDLCALC 55  TRIG 78.0  CHOLHDL 2  Result Date: 02/08/15 10:41 AM      Analyte   Result Value   Ref. Range    Units   Out of Range   Lab  WBC  4.7  4.0-10.5  K/uL    SLN  RBC  2.68  3.87-5.11  MIL/uL  L    Hemoglobin  8.4  12.0-15.0  g/dL  L    Hematocrit  25.4  36.0-46.0  %  L    MCV  94.8  78.0-100.0  fL      MCH  31.3  26.0-34.0  pg      MCHC  33.1  30.0-36.0  g/dL      RDW  13.2  11.5-15.5  %      Platelet Count  270  150-400  K/uL      MPV  8.8  8.6-12.4  fL      Granulocyte %  58  43-77  %      Absolute Gran  2.7  1.7-7.7  K/uL      Lymph %  16  12-46  %      Absolute Lymph  0.8  0.7-4.0  K/uL      Mono %  13  3-12  %  H    Absolute Mono  0.6  0.1-1.0  K/uL      Eos %  12  0-5  %  H    Absolute Eos  0.6  0.0-0.7  K/uL      Baso %  1  0-1  %      Absolute Baso  0.0  0.0-0.1  K/uL      Smear Review  Criteria for review not met          Comprehensive Metabolic Panel  Status: Final Out of Range  Result Date: 02/08/15 10:41 AM  Analyte   Result Value   Ref. Range    Units   Out of Range   Lab  Sodium  131  135-146  mmol/L  L  SLN  Potassium  3.9  3.5-5.3  mmol/L      Chloride  93  98-110  mmol/L  L    CO2  28  20-31  mmol/L      Glucose  85  65-99  mg/dL      BUN  25  7-25  mg/dL      Creatinine  1.51  0.60-0.88  mg/dL  H    Bilirubin, Total  0.3  0.2-1.2  mg/dL      Alkaline Phosphatase  51  33-130  U/L      AST/SGOT  24  10-35  U/L      ALT/SGPT  13  6-29  U/L      Total Protein  5.8  6.1-8.1  g/dL  L     Albumin  3.5  3.6-5.1  g/dL  L    Calcium  7.8  8.6-10.4  mg/dL   Radiological Exams: Dg Chest 2 View  01/18/2015   CLINICAL DATA:  Increasing shortness of breath with exertion  EXAM: CHEST  2 VIEW  COMPARISON:  March 03, 2014  FINDINGS: There is mild scarring in the left base region. There is no edema or consolidation. Heart is upper normal in size with pulmonary vascularity within normal limits. There is atherosclerotic change in the aorta. No adenopathy.  There are wedge compression fractures in the mid and lower thoracic spine with increase in kyphosis. There is evidence of old fractures of each proximal humeral metaphysis, stable. There is calcification in the left carotid artery.  IMPRESSION: Scarring left base. No edema or consolidation. No change in cardiac silhouette. Old fractures as noted. Increase in thoracic kyphosis. Calcification left carotid artery.   Electronically Signed   By: Lowella Grip III M.D.   On: 01/18/2015 14:01   Ct Chest Wo Contrast  01/19/2015   CLINICAL DATA:  Sigmoid colon mass, possible adenocarcinoma, evaluate for metastasis  EXAM: CT CHEST WITHOUT CONTRAST  TECHNIQUE: Multidetector CT imaging of the chest was performed following the standard protocol without IV contrast.  COMPARISON:  Chest radiographs dated 01/18/2015  FINDINGS: Mediastinum/Nodes: The heart is top-normal in size. Trace pleural fluid.  Coronary atherosclerosis.  Atherosclerotic calcifications of the aortic arch.  No suspicious mediastinal or axillary lymphadenopathy.  Visualized thyroid is unremarkable.  Lungs/Pleura: Negative mild patchy bibasilar opacities, likely atelectasis. No focal consolidation.  Trace bilateral pleural effusions.  No suspicious pulmonary nodules.  No pneumothorax.  Upper abdomen: Enteric tube coursing into the proximal stomach. Visualized upper abdomen is otherwise unchanged from recent CT, noting a 3.0 cm right adrenal nodule/mass.  Musculoskeletal: Severe compression  fracture deformities at T6 and T12. Moderate compression fracture deformity at T8.  IMPRESSION: No evidence of metastatic disease in the chest.  Trace bilateral pleural effusions.  Severe compression fracture deformities at T6 and T12. Moderate compression fracture deformity at T8.   Electronically Signed   By: Julian Hy M.D.   On: 01/19/2015 15:19   Mr Abdomen W Wo Contrast  01/20/2015   CLINICAL DATA:  Indeterminate right adrenal mass seen on recent CT. Distal colonic obstruction due to colon carcinoma.  EXAM: MRI ABDOMEN WITHOUT AND WITH CONTRAST  TECHNIQUE: Multiplanar multisequence MR imaging of the abdomen was performed both before and after the administration of intravenous contrast.  CONTRAST:  46m  MULTIHANCE GADOBENATE DIMEGLUMINE 529 MG/ML IV SOLN  COMPARISON:  AP CT on 01/18/2015  FINDINGS: Lower chest: No acute findings.  Hepatobiliary: A few tiny cysts scattered sub-cm hepatic cysts are noted, however no liver masses are identified.  Pancreas: No masses, inflammatory changes, or fluid collections demonstrated.  Spleen:  Within normal limits in size and appearance.  Adrenal Glands: 3.0 cm right adrenal mass is seen which shows peripheral enhancement and central cystic foci. No macroscopic fat is seen. No evidence of signal dropout on chemical shift imaging. These features are nonspecific. Left adrenal gland is normal in appearance.  Kidneys: No renal masses identified. No evidence of hydronephrosis.  Stomach/Bowel/Peritoneum: Mild dilatation of colonic bowel loops within abdomen again noted.  Vascular/Lymphatic: No pathologically enlarged lymph nodes identified. No abdominal aortic aneurysm or other significant retroperitoneal abnormality demonstrated.  Other:  None.  Musculoskeletal:  No suspicious bone lesions identified.  IMPRESSION: 3.0 cm right adrenal mass has nonspecific MR characteristics. Differential diagnosis still includes atypical adrenal adenoma, with adrenal metastasis  considered less likely. Consider PET-CT scan for further evaluation, or continued followup by CT in 6 months.   Electronically Signed   By: Earle Gell M.D.   On: 01/20/2015 09:28   Ct Abdomen Pelvis W Contrast  01/18/2015   CLINICAL DATA:  Lower abdominal pain, swelling, and cramping since yesterday.  EXAM: CT ABDOMEN AND PELVIS WITH CONTRAST  TECHNIQUE: Multidetector CT imaging of the abdomen and pelvis was performed using the standard protocol following bolus administration of intravenous contrast.  CONTRAST:  51m OMNIPAQUE IOHEXOL 300 MG/ML  SOLN  COMPARISON:  None.  FINDINGS: Lower Chest: Cardiomegaly and mild dependent bibasilar atelectasis noted.  Hepatobiliary: Probable tiny sub-cm hepatic cysts noted, but no definite liver masses are identified. Gallbladder is unremarkable.  Pancreas: No mass, inflammatory changes, or other significant abnormality identified.  Spleen:  Within normal limits in size and appearance.  Adrenals: 3.1 cm right adrenal mass is seen which has nonspecific features and enhancement characteristics.  Kidneys/Urinary Tract:  No evidence of masses or hydronephrosis.  Stomach/Bowel/Peritoneum: Small hiatal hernia noted. There is diffuse colonic dilatation. Abrupt transition with shoulder ring is seen in the mid sigmoid colon where there is evidence of annular colonic wall thickening measuring approximately 3.9 cm in length. This is highly suspicious for obstructing colon carcinoma. Sigmoid diverticulosis is demonstrated, however there is no evidence of diverticulitis. No evidence of small bowel obstruction.  Vascular/Lymphatic: No pathologically enlarged lymph nodes identified. 8 mm left common iliac lymph node noted on image 49/ series 201, which is not considered pathologically enlarged. No abdominal aortic aneurysm or other significant retroperitoneal abnormality demonstrated.  Reproductive: Prior hysterectomy noted. Adnexal regions are unremarkable in appearance. Tiny amount of free  fluid seen in the dependent pelvis.  Other:  None.  Musculoskeletal: No suspicious bone lesions identified. Lower lumbar spine fusion hardware and old T12 vertebral body compression fracture noted.  IMPRESSION: Distal colonic obstruction in the mid sigmoid colon, with appearance highly suspicious for obstructing colon adenocarcinoma. Sigmoidoscopy or colonoscopy recommended for further evaluation.  Sigmoid diverticulosis. No radiographic evidence of diverticulitis.  No definite evidence of metastatic disease within the abdomen or pelvis.  3.1 cm indeterminate right adrenal mass and probable tiny hepatic cysts. Abdomen MRI without and with contrast recommended for further evaluation.   Electronically Signed   By: JEarle GellM.D.   On: 01/18/2015 15:15    Assessment/Plan 1. Diverticulitis of colon Stable, GI symptoms unchanged. Has follow up with GI later this week.  2. Coronary artery disease involving native coronary artery of native heart without angina pectoris Stable, no chest pains. S/p catheterization showing patent stent during last hospitalization. Continue coreg 12.5 mg bid, cozaar 50 mg daily, aspirin 81 mg daily and pravastatin 80 mg daily. Not currently on plavix.   3. Chronic combined systolic and diastolic CHF (congestive heart failure) Today pt is euvolemic on exam, continue cozaar 50 mg daily, spironolactone 25 mg daily, lasix 40 mg daily and coreg 12.5 mg bid. Pt had follow up with cardiologist last week.   4. Chronic kidney disease, stage III (moderate) BUN/Cr slightly worse after increase in lasix, will need ongoing outpatient follow up   5. Primary osteoarthritis of both knees Continue oxycodone apap 5/325 1-2 tab q4h prn pain  6. Essential hypertension, benign Stable, continue cozaar 50 mg daily with coreg, aldactone and lasix  7. Bursitis Noted to have left arm pain, went to ortho today and was found to have bursitis of the left shoulder, injection given per ortho.  8.  Hyponatremia Remains on lasix, Na unchanged from hospitalization   9. Right adrenal mass Incidental finding, repeat ct scan recommended in 6 month for follow up  10. Anemia hgb currently at 8.4 which is has been stable. Will need further outpatient follow up.   Carlos American. Harle Battiest  [facility] [phone] 8 am - 5 pm) [phone] (after hours)

## 2015-02-13 DIAGNOSIS — K5900 Constipation, unspecified: Secondary | ICD-10-CM | POA: Diagnosis not present

## 2015-02-14 DIAGNOSIS — J449 Chronic obstructive pulmonary disease, unspecified: Secondary | ICD-10-CM | POA: Diagnosis not present

## 2015-02-14 DIAGNOSIS — G2581 Restless legs syndrome: Secondary | ICD-10-CM | POA: Diagnosis not present

## 2015-02-14 DIAGNOSIS — I509 Heart failure, unspecified: Secondary | ICD-10-CM | POA: Diagnosis not present

## 2015-02-14 DIAGNOSIS — N183 Chronic kidney disease, stage 3 (moderate): Secondary | ICD-10-CM | POA: Diagnosis not present

## 2015-02-14 DIAGNOSIS — M810 Age-related osteoporosis without current pathological fracture: Secondary | ICD-10-CM | POA: Diagnosis not present

## 2015-02-14 DIAGNOSIS — K5790 Diverticulosis of intestine, part unspecified, without perforation or abscess without bleeding: Secondary | ICD-10-CM | POA: Diagnosis not present

## 2015-02-14 DIAGNOSIS — F322 Major depressive disorder, single episode, severe without psychotic features: Secondary | ICD-10-CM | POA: Diagnosis not present

## 2015-02-14 DIAGNOSIS — M170 Bilateral primary osteoarthritis of knee: Secondary | ICD-10-CM | POA: Diagnosis not present

## 2015-02-14 DIAGNOSIS — I129 Hypertensive chronic kidney disease with stage 1 through stage 4 chronic kidney disease, or unspecified chronic kidney disease: Secondary | ICD-10-CM | POA: Diagnosis not present

## 2015-02-14 DIAGNOSIS — I2510 Atherosclerotic heart disease of native coronary artery without angina pectoris: Secondary | ICD-10-CM | POA: Diagnosis not present

## 2015-02-14 DIAGNOSIS — I252 Old myocardial infarction: Secondary | ICD-10-CM | POA: Diagnosis not present

## 2015-02-14 DIAGNOSIS — M1990 Unspecified osteoarthritis, unspecified site: Secondary | ICD-10-CM | POA: Diagnosis not present

## 2015-02-14 DIAGNOSIS — F419 Anxiety disorder, unspecified: Secondary | ICD-10-CM | POA: Diagnosis not present

## 2015-02-18 ENCOUNTER — Encounter: Payer: Self-pay | Admitting: Internal Medicine

## 2015-02-18 ENCOUNTER — Ambulatory Visit (INDEPENDENT_AMBULATORY_CARE_PROVIDER_SITE_OTHER): Payer: Medicare Other | Admitting: Internal Medicine

## 2015-02-18 VITALS — BP 102/68 | HR 69 | Temp 98.4°F | Wt 177.0 lb

## 2015-02-18 DIAGNOSIS — N183 Chronic kidney disease, stage 3 unspecified: Secondary | ICD-10-CM

## 2015-02-18 DIAGNOSIS — D62 Acute posthemorrhagic anemia: Secondary | ICD-10-CM

## 2015-02-18 DIAGNOSIS — I5042 Chronic combined systolic (congestive) and diastolic (congestive) heart failure: Secondary | ICD-10-CM | POA: Diagnosis not present

## 2015-02-18 DIAGNOSIS — K5732 Diverticulitis of large intestine without perforation or abscess without bleeding: Secondary | ICD-10-CM | POA: Diagnosis not present

## 2015-02-18 DIAGNOSIS — E871 Hypo-osmolality and hyponatremia: Secondary | ICD-10-CM

## 2015-02-18 LAB — RENAL FUNCTION PANEL
Albumin: 4 g/dL (ref 3.5–5.2)
BUN: 39 mg/dL — ABNORMAL HIGH (ref 6–23)
CALCIUM: 9.5 mg/dL (ref 8.4–10.5)
CHLORIDE: 96 meq/L (ref 96–112)
CO2: 29 mEq/L (ref 19–32)
Creatinine, Ser: 1.35 mg/dL — ABNORMAL HIGH (ref 0.40–1.20)
GFR: 39.72 mL/min — AB (ref >60.00)
Glucose, Bld: 83 mg/dL (ref 70–99)
Phosphorus: 4.1 mg/dL (ref 2.3–4.6)
Potassium: 4.6 mEq/L (ref 3.5–5.1)
SODIUM: 133 meq/L — AB (ref 135–145)

## 2015-02-18 LAB — CBC WITH DIFFERENTIAL/PLATELET
Basophils Absolute: 0 10*3/uL (ref 0.0–0.1)
Basophils Relative: 0.5 % (ref 0.0–3.0)
Eosinophils Absolute: 0.2 10*3/uL (ref 0.0–0.7)
Eosinophils Relative: 3.1 % (ref 0.0–5.0)
HCT: 30.3 % — ABNORMAL LOW (ref 36.0–46.0)
Hemoglobin: 9.9 g/dL — ABNORMAL LOW (ref 12.0–15.0)
Lymphocytes Relative: 17.1 % (ref 12.0–46.0)
Lymphs Abs: 1 10*3/uL (ref 0.7–4.0)
MCHC: 32.8 g/dL (ref 30.0–36.0)
MCV: 96.1 fl (ref 78.0–100.0)
MONO ABS: 1 10*3/uL (ref 0.1–1.0)
Monocytes Relative: 16.7 % — ABNORMAL HIGH (ref 3.0–12.0)
NEUTROS ABS: 3.7 10*3/uL (ref 1.4–7.7)
Neutrophils Relative %: 62.6 % (ref 43.0–77.0)
PLATELETS: 227 10*3/uL (ref 150.0–400.0)
RBC: 3.16 Mil/uL — ABNORMAL LOW (ref 3.87–5.11)
RDW: 13.2 % (ref 11.5–15.5)
WBC: 5.9 10*3/uL (ref 4.0–10.5)

## 2015-02-18 MED ORDER — ALBUTEROL SULFATE (2.5 MG/3ML) 0.083% IN NEBU: 2.5000 mg | INHALATION_SOLUTION | Freq: Four times a day (QID) | RESPIRATORY_TRACT | Status: AC | PRN

## 2015-02-18 NOTE — Assessment & Plan Note (Signed)
Caused colonic obstruction  Now resolved No Rx

## 2015-02-18 NOTE — Patient Instructions (Signed)
Please start a children's chewable vitamin with iron

## 2015-02-18 NOTE — Assessment & Plan Note (Signed)
Last creat 1.51

## 2015-02-18 NOTE — Assessment & Plan Note (Signed)
Exacerbation while in rehab but weight back down  will recheck renal function back on regular diuretics

## 2015-02-18 NOTE — Assessment & Plan Note (Signed)
Had diverticular bleed Last hemoglobin 8.4 Will recheck  Start children's vitamin with iron--should be tolerable

## 2015-02-18 NOTE — Progress Notes (Signed)
Subjective:    Patient ID: Haley Harris, female    DOB: 1928-07-16, 81 y.o.   MRN: [id number]  HPI Here with daughter  Reviewed hospital records and rehab Home since last week  Had colonic obstruction--- fortunately was found to be diverticulitis, not a neoplasm Treated with antibiotics for 3 weeks or so--now off Did have brief lower GI bleed--thought to be from the divertculi Anemia noted--- last few stable in low 8's Off  plavix for now--and probably permanently (cath was reassuring)  Having some throat symptoms Breathing is off some Albuterol inhaler has helped  No further bleeding since discharge  Had weight gain that required further diuresis Back on her usual furosemide now Some chest pain--- mostly heaviness (thinks it is respiratory) Hasn't used nitroglycerin (pain usually goes away   Current Outpatient Prescriptions on File Prior to Visit  Medication Sig Dispense Refill  . acetaminophen (TYLENOL) 325 MG tablet Take 2 tablets (650 mg total) by mouth every 4 (four) hours as needed for headache or mild pain.    Marland Kitchen albuterol (PROVENTIL) (2.5 MG/3ML) 0.083% nebulizer solution Take 3 mLs (2.5 mg total) by nebulization every 6 (six) hours as needed for wheezing or shortness of breath. 75 mL 3  . aspirin 81 MG tablet Take 81 mg by mouth daily after lunch.     . Calcium Carbonate-Vitamin D (CALTRATE 600+D PO) Take 1 tablet by mouth daily after lunch.     . carvedilol (COREG) 12.5 MG tablet Take 1 tablet (12.5 mg total) by mouth 2 (two) times daily with a meal. 180 tablet 3  . cetirizine (ZYRTEC) 10 MG tablet Take 10 mg by mouth daily. For allergies    . denosumab (PROLIA) 60 MG/ML SOLN injection Inject 60 mg into the skin every 6 (six) months. Administer in upper arm, thigh, or abdomen    . escitalopram (LEXAPRO) 10 MG tablet Take 1 tablet (10 mg total) by mouth daily. 90 tablet 3  . famotidine (PEPCID) 20 MG tablet Take 1 tablet (20 mg total) by mouth 2 (two) times daily. 180  tablet 3  . fluticasone (FLONASE) 50 MCG/ACT nasal spray Place 2 sprays into both nostrils daily. 48 g 3  . furosemide (LASIX) 40 MG tablet Take 1 tablet (40 mg total) by mouth daily. 90 tablet 3  . gabapentin (NEURONTIN) 100 MG capsule Take 1 capsule (100 mg total) by mouth 2 (two) times daily. 180 capsule 3  . losartan (COZAAR) 50 MG tablet TAKE 1 TABLET BY MOUTH DAILY 90 tablet 3  . montelukast (SINGULAIR) 10 MG tablet TAKE 1 TABLET BY MOUTH DAILY 90 tablet 3  . Multiple Vitamin (MULITIVITAMIN WITH MINERALS) TABS Take 1 tablet by mouth daily after lunch.     . oxyCODONE-acetaminophen (PERCOCET/ROXICET) 5-325 MG per tablet Take 1-2 tablets by mouth every 4 (four) hours as needed. 30 tablet 0  . pravastatin (PRAVACHOL) 80 MG tablet Take 1 tablet by mouth  daily 90 tablet 3  . spironolactone (ALDACTONE) 25 MG tablet Take 1 tablet (25 mg total) by mouth daily. 90 tablet 3  . vitamin B-12 (CYANOCOBALAMIN) 500 MCG tablet Take 1,000 mcg by mouth 3 (three) times a week. Mon, Wed, Fri     No current facility-administered medications on file prior to visit.    Allergies  Allergen Reactions  . Amoxicillin-Pot Clavulanate Anaphylaxis  . Cephalexin Other (See Comments)    Reaction unknown  . Clarithromycin Other (See Comments)    Reaction unknown  . Lidocaine   .  Nifedipine Other (See Comments)    Reaction unknown  . Nsaids Other (See Comments)    Heart issue  . Olmesartan Medoxomil     REACTION: cough;  But tolerating Losartan 02/2014  . Penicillins   . Tramadol Hcl Other (See Comments)    Reaction unknown    Past Medical History  Diagnosis Date  . Allergic rhinitis, cause unspecified   . Unspecified cardiovascular disease   . Personal history of malignant neoplasm of breast   . Occlusion and stenosis of carotid artery without mention of cerebral infarction   . Chronic airway obstruction, not elsewhere classified   . Depressive disorder, not elsewhere classified   . Other dyspnea and  respiratory abnormality   . Esophageal reflux   . Unspecified hearing loss   . Other and unspecified hyperlipidemia   . Unspecified essential hypertension   . Osteoporosis, unspecified   . Peripheral vascular disease, unspecified   . Unspecified urinary incontinence   . Spinal stenosis   . ACE-inhibitor cough   . Angina   . Heart murmur     aS CHILD  . Shortness of breath   . Recurrent upper respiratory infection (URI)   . Anxiety   . Pneumonia   . 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 Myocardial infarct   . Osteoarthrosis, unspecified whether generalized or localized, unspecified site   . Malignant neoplasm of breast (female), unspecified site   . Compression fracture of T12 vertebra 10/28/2011    Past Surgical History  Procedure Laterality Date  . Mastectomy  1987    Right  . Breast reconstruction  1998    Reconstruction   . Reduction mammaplasty  1998    Left  . Mastoidectomy  childhood  . Appendectomy  1948  . Lumbar laminectomy  2003  . Shoulder surgery  02/2005    Bilateral fractures with multiple surgeries  . Breast implant removal  06/12/09    right  . Abdominal hysterectomy    . Fracture surgery      fracture right elbow  . Coronary angioplasty  11/12    distal RCA  . Mastectomy    . Left heart catheterization with coronary angiogram N/A 06/05/2011    Procedure: LEFT HEART CATHETERIZATION WITH CORONARY ANGIOGRAM;  Surgeon: Clent Demark, MD;  Location[facility] CATH LAB;  Service: Cardiovascular;  Laterality: N/A;  . Left heart catheterization with coronary angiogram N/A 03/05/2014    Procedure: LEFT HEART CATHETERIZATION WITH CORONARY ANGIOGRAM;  Surgeon: Clent Demark, MD;  Location: [facility] CATH LAB;  Service: Cardiovascular;  Laterality: N/A;  . Flexible sigmoidoscopy N/A 01/21/2015    Procedure: FLEXIBLE SIGMOIDOSCOPY;  Surgeon: Richmond Campbell, MD;  Location: [facility] ENDOSCOPY;  Service: Endoscopy;  Laterality: N/A;  . Flexible sigmoidoscopy  01/22/2015        . Cardiac catheterization N/A 01/25/2015    Procedure: Left Heart Cath and Coronary Angiography;  Surgeon: Charolette Forward, MD;  Location: [place] CV LAB;  Service: Cardiovascular;  Laterality: N/A;    Family History  Problem Relation Age of Onset  . Heart attack Father 89  . Cancer Mother     ovarian  . Cancer Brother     lung  . Arthritis      family    History   Social History  . Marital Status: Widowed    Spouse Name: N/A  . Number of Children: 2  . Years of Education: N/A   Occupational History  . 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     Social History Main Topics  . Smoking status: Former Smoker    Quit date: 07/13/1974  . Smokeless tobacco: Never Used  . Alcohol Use: 0.0 oz/week    0 Standard drinks or equivalent per week     Comment: occasional  . Drug Use: No  . Sexual Activity: No   Other Topics Concern  . Not on file   Social History Narrative   Has living will   Son or daughter would be health care POA.   Requests DNR--written 03/02/13   No tube feeds if cognitively unaware    Review of Systems Not on iron--usually can't tolerate it  Appetite is good now Sleep is erratic-- during day if sleeps. Disturbed by nocturia.  Sleeps in bed on 2 pillows--no PND    Objective:   Physical Exam  Constitutional: She appears well-developed and well-nourished. No distress.  Neck: Normal range of motion. Neck supple.  Cardiovascular: Normal rate, regular rhythm and normal heart sounds.  Exam reveals no gallop.   No murmur heard. Pulmonary/Chest: Effort normal and breath sounds normal. No respiratory distress. She has no wheezes. She has no rales.  Abdominal: Soft. She exhibits no distension. There is no tenderness. There is no rebound and no guarding.  Musculoskeletal:  Trace calf edema without pitting. Support hose on  Lymphadenopathy:    She has no cervical adenopathy.  Psychiatric: She has a normal mood and affect. Her behavior is  normal.          Assessment & Plan:

## 2015-02-18 NOTE — Progress Notes (Signed)
Pre visit review using our clinic review tool, if applicable. No additional management support is needed unless otherwise documented below in the visit note. 

## 2015-02-19 ENCOUNTER — Encounter: Payer: Self-pay | Admitting: *Deleted

## 2015-02-19 DIAGNOSIS — M810 Age-related osteoporosis without current pathological fracture: Secondary | ICD-10-CM | POA: Diagnosis not present

## 2015-02-19 DIAGNOSIS — F322 Major depressive disorder, single episode, severe without psychotic features: Secondary | ICD-10-CM | POA: Diagnosis not present

## 2015-02-19 DIAGNOSIS — I509 Heart failure, unspecified: Secondary | ICD-10-CM | POA: Diagnosis not present

## 2015-02-19 DIAGNOSIS — I2510 Atherosclerotic heart disease of native coronary artery without angina pectoris: Secondary | ICD-10-CM | POA: Diagnosis not present

## 2015-02-19 DIAGNOSIS — N183 Chronic kidney disease, stage 3 (moderate): Secondary | ICD-10-CM | POA: Diagnosis not present

## 2015-02-19 DIAGNOSIS — J449 Chronic obstructive pulmonary disease, unspecified: Secondary | ICD-10-CM | POA: Diagnosis not present

## 2015-02-19 DIAGNOSIS — I129 Hypertensive chronic kidney disease with stage 1 through stage 4 chronic kidney disease, or unspecified chronic kidney disease: Secondary | ICD-10-CM | POA: Diagnosis not present

## 2015-02-19 DIAGNOSIS — K5790 Diverticulosis of intestine, part unspecified, without perforation or abscess without bleeding: Secondary | ICD-10-CM | POA: Diagnosis not present

## 2015-02-19 DIAGNOSIS — I252 Old myocardial infarction: Secondary | ICD-10-CM | POA: Diagnosis not present

## 2015-02-19 DIAGNOSIS — M170 Bilateral primary osteoarthritis of knee: Secondary | ICD-10-CM | POA: Diagnosis not present

## 2015-02-19 DIAGNOSIS — G2581 Restless legs syndrome: Secondary | ICD-10-CM | POA: Diagnosis not present

## 2015-02-19 DIAGNOSIS — F419 Anxiety disorder, unspecified: Secondary | ICD-10-CM | POA: Diagnosis not present

## 2015-02-19 DIAGNOSIS — M1990 Unspecified osteoarthritis, unspecified site: Secondary | ICD-10-CM | POA: Diagnosis not present

## 2015-02-20 DIAGNOSIS — F419 Anxiety disorder, unspecified: Secondary | ICD-10-CM | POA: Diagnosis not present

## 2015-02-20 DIAGNOSIS — I2510 Atherosclerotic heart disease of native coronary artery without angina pectoris: Secondary | ICD-10-CM | POA: Diagnosis not present

## 2015-02-20 DIAGNOSIS — N183 Chronic kidney disease, stage 3 (moderate): Secondary | ICD-10-CM | POA: Diagnosis not present

## 2015-02-20 DIAGNOSIS — M1990 Unspecified osteoarthritis, unspecified site: Secondary | ICD-10-CM | POA: Diagnosis not present

## 2015-02-20 DIAGNOSIS — J449 Chronic obstructive pulmonary disease, unspecified: Secondary | ICD-10-CM | POA: Diagnosis not present

## 2015-02-20 DIAGNOSIS — I129 Hypertensive chronic kidney disease with stage 1 through stage 4 chronic kidney disease, or unspecified chronic kidney disease: Secondary | ICD-10-CM | POA: Diagnosis not present

## 2015-02-20 DIAGNOSIS — M810 Age-related osteoporosis without current pathological fracture: Secondary | ICD-10-CM | POA: Diagnosis not present

## 2015-02-20 DIAGNOSIS — K5790 Diverticulosis of intestine, part unspecified, without perforation or abscess without bleeding: Secondary | ICD-10-CM | POA: Diagnosis not present

## 2015-02-20 DIAGNOSIS — I252 Old myocardial infarction: Secondary | ICD-10-CM | POA: Diagnosis not present

## 2015-02-20 DIAGNOSIS — M170 Bilateral primary osteoarthritis of knee: Secondary | ICD-10-CM | POA: Diagnosis not present

## 2015-02-20 DIAGNOSIS — I509 Heart failure, unspecified: Secondary | ICD-10-CM | POA: Diagnosis not present

## 2015-02-20 DIAGNOSIS — F322 Major depressive disorder, single episode, severe without psychotic features: Secondary | ICD-10-CM | POA: Diagnosis not present

## 2015-02-20 DIAGNOSIS — G2581 Restless legs syndrome: Secondary | ICD-10-CM | POA: Diagnosis not present

## 2015-02-21 DIAGNOSIS — J449 Chronic obstructive pulmonary disease, unspecified: Secondary | ICD-10-CM | POA: Diagnosis not present

## 2015-02-21 DIAGNOSIS — F419 Anxiety disorder, unspecified: Secondary | ICD-10-CM | POA: Diagnosis not present

## 2015-02-21 DIAGNOSIS — G2581 Restless legs syndrome: Secondary | ICD-10-CM | POA: Diagnosis not present

## 2015-02-21 DIAGNOSIS — I509 Heart failure, unspecified: Secondary | ICD-10-CM | POA: Diagnosis not present

## 2015-02-21 DIAGNOSIS — M1990 Unspecified osteoarthritis, unspecified site: Secondary | ICD-10-CM | POA: Diagnosis not present

## 2015-02-21 DIAGNOSIS — I129 Hypertensive chronic kidney disease with stage 1 through stage 4 chronic kidney disease, or unspecified chronic kidney disease: Secondary | ICD-10-CM | POA: Diagnosis not present

## 2015-02-21 DIAGNOSIS — N183 Chronic kidney disease, stage 3 (moderate): Secondary | ICD-10-CM | POA: Diagnosis not present

## 2015-02-21 DIAGNOSIS — F322 Major depressive disorder, single episode, severe without psychotic features: Secondary | ICD-10-CM | POA: Diagnosis not present

## 2015-02-21 DIAGNOSIS — M170 Bilateral primary osteoarthritis of knee: Secondary | ICD-10-CM | POA: Diagnosis not present

## 2015-02-21 DIAGNOSIS — M810 Age-related osteoporosis without current pathological fracture: Secondary | ICD-10-CM | POA: Diagnosis not present

## 2015-02-21 DIAGNOSIS — I2510 Atherosclerotic heart disease of native coronary artery without angina pectoris: Secondary | ICD-10-CM | POA: Diagnosis not present

## 2015-02-21 DIAGNOSIS — K5790 Diverticulosis of intestine, part unspecified, without perforation or abscess without bleeding: Secondary | ICD-10-CM | POA: Diagnosis not present

## 2015-02-21 DIAGNOSIS — I252 Old myocardial infarction: Secondary | ICD-10-CM | POA: Diagnosis not present

## 2015-02-22 ENCOUNTER — Telehealth: Payer: Self-pay

## 2015-02-22 NOTE — Telephone Encounter (Signed)
That is fine 

## 2015-02-22 NOTE — Telephone Encounter (Signed)
Left message on machine with results, advised therapist to call if any questions.

## 2015-02-22 NOTE — Telephone Encounter (Signed)
Marlowe Kays OT with [facility] left v/m requesting verbal orders for home health OT 2 x a week for 4 weeks for breathing exercises and energy conservation.Please advise.

## 2015-02-25 DIAGNOSIS — F419 Anxiety disorder, unspecified: Secondary | ICD-10-CM | POA: Diagnosis not present

## 2015-02-25 DIAGNOSIS — I509 Heart failure, unspecified: Secondary | ICD-10-CM | POA: Diagnosis not present

## 2015-02-25 DIAGNOSIS — K5790 Diverticulosis of intestine, part unspecified, without perforation or abscess without bleeding: Secondary | ICD-10-CM | POA: Diagnosis not present

## 2015-02-25 DIAGNOSIS — J449 Chronic obstructive pulmonary disease, unspecified: Secondary | ICD-10-CM | POA: Diagnosis not present

## 2015-02-25 DIAGNOSIS — M1990 Unspecified osteoarthritis, unspecified site: Secondary | ICD-10-CM | POA: Diagnosis not present

## 2015-02-25 DIAGNOSIS — N183 Chronic kidney disease, stage 3 (moderate): Secondary | ICD-10-CM | POA: Diagnosis not present

## 2015-02-25 DIAGNOSIS — M810 Age-related osteoporosis without current pathological fracture: Secondary | ICD-10-CM | POA: Diagnosis not present

## 2015-02-25 DIAGNOSIS — M170 Bilateral primary osteoarthritis of knee: Secondary | ICD-10-CM | POA: Diagnosis not present

## 2015-02-25 DIAGNOSIS — I129 Hypertensive chronic kidney disease with stage 1 through stage 4 chronic kidney disease, or unspecified chronic kidney disease: Secondary | ICD-10-CM | POA: Diagnosis not present

## 2015-02-25 DIAGNOSIS — F322 Major depressive disorder, single episode, severe without psychotic features: Secondary | ICD-10-CM | POA: Diagnosis not present

## 2015-02-25 DIAGNOSIS — I2510 Atherosclerotic heart disease of native coronary artery without angina pectoris: Secondary | ICD-10-CM | POA: Diagnosis not present

## 2015-02-25 DIAGNOSIS — G2581 Restless legs syndrome: Secondary | ICD-10-CM | POA: Diagnosis not present

## 2015-02-25 DIAGNOSIS — I252 Old myocardial infarction: Secondary | ICD-10-CM | POA: Diagnosis not present

## 2015-02-26 DIAGNOSIS — J449 Chronic obstructive pulmonary disease, unspecified: Secondary | ICD-10-CM | POA: Diagnosis not present

## 2015-02-26 DIAGNOSIS — I2510 Atherosclerotic heart disease of native coronary artery without angina pectoris: Secondary | ICD-10-CM | POA: Diagnosis not present

## 2015-02-26 DIAGNOSIS — K5790 Diverticulosis of intestine, part unspecified, without perforation or abscess without bleeding: Secondary | ICD-10-CM | POA: Diagnosis not present

## 2015-02-26 DIAGNOSIS — F419 Anxiety disorder, unspecified: Secondary | ICD-10-CM | POA: Diagnosis not present

## 2015-02-26 DIAGNOSIS — M1990 Unspecified osteoarthritis, unspecified site: Secondary | ICD-10-CM | POA: Diagnosis not present

## 2015-02-26 DIAGNOSIS — F322 Major depressive disorder, single episode, severe without psychotic features: Secondary | ICD-10-CM | POA: Diagnosis not present

## 2015-02-26 DIAGNOSIS — I509 Heart failure, unspecified: Secondary | ICD-10-CM | POA: Diagnosis not present

## 2015-02-26 DIAGNOSIS — G2581 Restless legs syndrome: Secondary | ICD-10-CM | POA: Diagnosis not present

## 2015-02-26 DIAGNOSIS — N183 Chronic kidney disease, stage 3 (moderate): Secondary | ICD-10-CM | POA: Diagnosis not present

## 2015-02-26 DIAGNOSIS — M170 Bilateral primary osteoarthritis of knee: Secondary | ICD-10-CM | POA: Diagnosis not present

## 2015-02-26 DIAGNOSIS — M810 Age-related osteoporosis without current pathological fracture: Secondary | ICD-10-CM | POA: Diagnosis not present

## 2015-02-26 DIAGNOSIS — I252 Old myocardial infarction: Secondary | ICD-10-CM | POA: Diagnosis not present

## 2015-02-26 DIAGNOSIS — I129 Hypertensive chronic kidney disease with stage 1 through stage 4 chronic kidney disease, or unspecified chronic kidney disease: Secondary | ICD-10-CM | POA: Diagnosis not present

## 2015-02-27 DIAGNOSIS — J449 Chronic obstructive pulmonary disease, unspecified: Secondary | ICD-10-CM | POA: Diagnosis not present

## 2015-02-27 DIAGNOSIS — M810 Age-related osteoporosis without current pathological fracture: Secondary | ICD-10-CM | POA: Diagnosis not present

## 2015-02-27 DIAGNOSIS — M1990 Unspecified osteoarthritis, unspecified site: Secondary | ICD-10-CM | POA: Diagnosis not present

## 2015-02-27 DIAGNOSIS — F419 Anxiety disorder, unspecified: Secondary | ICD-10-CM | POA: Diagnosis not present

## 2015-02-27 DIAGNOSIS — I129 Hypertensive chronic kidney disease with stage 1 through stage 4 chronic kidney disease, or unspecified chronic kidney disease: Secondary | ICD-10-CM | POA: Diagnosis not present

## 2015-02-27 DIAGNOSIS — I252 Old myocardial infarction: Secondary | ICD-10-CM | POA: Diagnosis not present

## 2015-02-27 DIAGNOSIS — I2510 Atherosclerotic heart disease of native coronary artery without angina pectoris: Secondary | ICD-10-CM | POA: Diagnosis not present

## 2015-02-27 DIAGNOSIS — F322 Major depressive disorder, single episode, severe without psychotic features: Secondary | ICD-10-CM | POA: Diagnosis not present

## 2015-02-27 DIAGNOSIS — M170 Bilateral primary osteoarthritis of knee: Secondary | ICD-10-CM | POA: Diagnosis not present

## 2015-02-27 DIAGNOSIS — K5790 Diverticulosis of intestine, part unspecified, without perforation or abscess without bleeding: Secondary | ICD-10-CM | POA: Diagnosis not present

## 2015-02-27 DIAGNOSIS — I509 Heart failure, unspecified: Secondary | ICD-10-CM | POA: Diagnosis not present

## 2015-02-27 DIAGNOSIS — N183 Chronic kidney disease, stage 3 (moderate): Secondary | ICD-10-CM | POA: Diagnosis not present

## 2015-02-27 DIAGNOSIS — G2581 Restless legs syndrome: Secondary | ICD-10-CM | POA: Diagnosis not present

## 2015-02-28 DIAGNOSIS — F419 Anxiety disorder, unspecified: Secondary | ICD-10-CM | POA: Diagnosis not present

## 2015-02-28 DIAGNOSIS — F322 Major depressive disorder, single episode, severe without psychotic features: Secondary | ICD-10-CM | POA: Diagnosis not present

## 2015-02-28 DIAGNOSIS — N183 Chronic kidney disease, stage 3 (moderate): Secondary | ICD-10-CM | POA: Diagnosis not present

## 2015-02-28 DIAGNOSIS — M170 Bilateral primary osteoarthritis of knee: Secondary | ICD-10-CM | POA: Diagnosis not present

## 2015-02-28 DIAGNOSIS — M1990 Unspecified osteoarthritis, unspecified site: Secondary | ICD-10-CM | POA: Diagnosis not present

## 2015-02-28 DIAGNOSIS — J449 Chronic obstructive pulmonary disease, unspecified: Secondary | ICD-10-CM | POA: Diagnosis not present

## 2015-02-28 DIAGNOSIS — G2581 Restless legs syndrome: Secondary | ICD-10-CM | POA: Diagnosis not present

## 2015-02-28 DIAGNOSIS — K5790 Diverticulosis of intestine, part unspecified, without perforation or abscess without bleeding: Secondary | ICD-10-CM | POA: Diagnosis not present

## 2015-02-28 DIAGNOSIS — I129 Hypertensive chronic kidney disease with stage 1 through stage 4 chronic kidney disease, or unspecified chronic kidney disease: Secondary | ICD-10-CM | POA: Diagnosis not present

## 2015-02-28 DIAGNOSIS — I2510 Atherosclerotic heart disease of native coronary artery without angina pectoris: Secondary | ICD-10-CM | POA: Diagnosis not present

## 2015-02-28 DIAGNOSIS — I509 Heart failure, unspecified: Secondary | ICD-10-CM | POA: Diagnosis not present

## 2015-02-28 DIAGNOSIS — M810 Age-related osteoporosis without current pathological fracture: Secondary | ICD-10-CM | POA: Diagnosis not present

## 2015-02-28 DIAGNOSIS — I252 Old myocardial infarction: Secondary | ICD-10-CM | POA: Diagnosis not present

## 2015-03-01 DIAGNOSIS — I129 Hypertensive chronic kidney disease with stage 1 through stage 4 chronic kidney disease, or unspecified chronic kidney disease: Secondary | ICD-10-CM | POA: Diagnosis not present

## 2015-03-01 DIAGNOSIS — F322 Major depressive disorder, single episode, severe without psychotic features: Secondary | ICD-10-CM | POA: Diagnosis not present

## 2015-03-01 DIAGNOSIS — K5790 Diverticulosis of intestine, part unspecified, without perforation or abscess without bleeding: Secondary | ICD-10-CM | POA: Diagnosis not present

## 2015-03-01 DIAGNOSIS — I509 Heart failure, unspecified: Secondary | ICD-10-CM | POA: Diagnosis not present

## 2015-03-01 DIAGNOSIS — M170 Bilateral primary osteoarthritis of knee: Secondary | ICD-10-CM | POA: Diagnosis not present

## 2015-03-01 DIAGNOSIS — M810 Age-related osteoporosis without current pathological fracture: Secondary | ICD-10-CM | POA: Diagnosis not present

## 2015-03-01 DIAGNOSIS — I2510 Atherosclerotic heart disease of native coronary artery without angina pectoris: Secondary | ICD-10-CM | POA: Diagnosis not present

## 2015-03-01 DIAGNOSIS — N183 Chronic kidney disease, stage 3 (moderate): Secondary | ICD-10-CM | POA: Diagnosis not present

## 2015-03-01 DIAGNOSIS — G2581 Restless legs syndrome: Secondary | ICD-10-CM | POA: Diagnosis not present

## 2015-03-01 DIAGNOSIS — J449 Chronic obstructive pulmonary disease, unspecified: Secondary | ICD-10-CM | POA: Diagnosis not present

## 2015-03-01 DIAGNOSIS — I252 Old myocardial infarction: Secondary | ICD-10-CM | POA: Diagnosis not present

## 2015-03-01 DIAGNOSIS — M1990 Unspecified osteoarthritis, unspecified site: Secondary | ICD-10-CM | POA: Diagnosis not present

## 2015-03-01 DIAGNOSIS — F419 Anxiety disorder, unspecified: Secondary | ICD-10-CM | POA: Diagnosis not present

## 2015-03-04 DIAGNOSIS — M1990 Unspecified osteoarthritis, unspecified site: Secondary | ICD-10-CM | POA: Diagnosis not present

## 2015-03-04 DIAGNOSIS — M170 Bilateral primary osteoarthritis of knee: Secondary | ICD-10-CM | POA: Diagnosis not present

## 2015-03-04 DIAGNOSIS — I509 Heart failure, unspecified: Secondary | ICD-10-CM | POA: Diagnosis not present

## 2015-03-04 DIAGNOSIS — N183 Chronic kidney disease, stage 3 (moderate): Secondary | ICD-10-CM | POA: Diagnosis not present

## 2015-03-04 DIAGNOSIS — K5790 Diverticulosis of intestine, part unspecified, without perforation or abscess without bleeding: Secondary | ICD-10-CM | POA: Diagnosis not present

## 2015-03-04 DIAGNOSIS — I129 Hypertensive chronic kidney disease with stage 1 through stage 4 chronic kidney disease, or unspecified chronic kidney disease: Secondary | ICD-10-CM | POA: Diagnosis not present

## 2015-03-04 DIAGNOSIS — F322 Major depressive disorder, single episode, severe without psychotic features: Secondary | ICD-10-CM | POA: Diagnosis not present

## 2015-03-04 DIAGNOSIS — I252 Old myocardial infarction: Secondary | ICD-10-CM | POA: Diagnosis not present

## 2015-03-04 DIAGNOSIS — F419 Anxiety disorder, unspecified: Secondary | ICD-10-CM | POA: Diagnosis not present

## 2015-03-04 DIAGNOSIS — M810 Age-related osteoporosis without current pathological fracture: Secondary | ICD-10-CM | POA: Diagnosis not present

## 2015-03-04 DIAGNOSIS — J449 Chronic obstructive pulmonary disease, unspecified: Secondary | ICD-10-CM | POA: Diagnosis not present

## 2015-03-04 DIAGNOSIS — G2581 Restless legs syndrome: Secondary | ICD-10-CM | POA: Diagnosis not present

## 2015-03-04 DIAGNOSIS — I2510 Atherosclerotic heart disease of native coronary artery without angina pectoris: Secondary | ICD-10-CM | POA: Diagnosis not present

## 2015-03-05 DIAGNOSIS — N183 Chronic kidney disease, stage 3 (moderate): Secondary | ICD-10-CM

## 2015-03-05 DIAGNOSIS — M170 Bilateral primary osteoarthritis of knee: Secondary | ICD-10-CM | POA: Diagnosis not present

## 2015-03-05 DIAGNOSIS — F419 Anxiety disorder, unspecified: Secondary | ICD-10-CM | POA: Diagnosis not present

## 2015-03-05 DIAGNOSIS — M810 Age-related osteoporosis without current pathological fracture: Secondary | ICD-10-CM | POA: Diagnosis not present

## 2015-03-05 DIAGNOSIS — F322 Major depressive disorder, single episode, severe without psychotic features: Secondary | ICD-10-CM | POA: Diagnosis not present

## 2015-03-05 DIAGNOSIS — J449 Chronic obstructive pulmonary disease, unspecified: Secondary | ICD-10-CM | POA: Diagnosis not present

## 2015-03-05 DIAGNOSIS — I2510 Atherosclerotic heart disease of native coronary artery without angina pectoris: Secondary | ICD-10-CM | POA: Diagnosis not present

## 2015-03-05 DIAGNOSIS — I252 Old myocardial infarction: Secondary | ICD-10-CM | POA: Diagnosis not present

## 2015-03-05 DIAGNOSIS — K5790 Diverticulosis of intestine, part unspecified, without perforation or abscess without bleeding: Secondary | ICD-10-CM

## 2015-03-05 DIAGNOSIS — I509 Heart failure, unspecified: Secondary | ICD-10-CM | POA: Diagnosis not present

## 2015-03-05 DIAGNOSIS — M1990 Unspecified osteoarthritis, unspecified site: Secondary | ICD-10-CM | POA: Diagnosis not present

## 2015-03-05 DIAGNOSIS — I129 Hypertensive chronic kidney disease with stage 1 through stage 4 chronic kidney disease, or unspecified chronic kidney disease: Secondary | ICD-10-CM | POA: Diagnosis not present

## 2015-03-05 DIAGNOSIS — G2581 Restless legs syndrome: Secondary | ICD-10-CM | POA: Diagnosis not present

## 2015-03-06 DIAGNOSIS — I252 Old myocardial infarction: Secondary | ICD-10-CM | POA: Diagnosis not present

## 2015-03-06 DIAGNOSIS — G2581 Restless legs syndrome: Secondary | ICD-10-CM | POA: Diagnosis not present

## 2015-03-06 DIAGNOSIS — M1990 Unspecified osteoarthritis, unspecified site: Secondary | ICD-10-CM | POA: Diagnosis not present

## 2015-03-06 DIAGNOSIS — N183 Chronic kidney disease, stage 3 (moderate): Secondary | ICD-10-CM | POA: Diagnosis not present

## 2015-03-06 DIAGNOSIS — M810 Age-related osteoporosis without current pathological fracture: Secondary | ICD-10-CM | POA: Diagnosis not present

## 2015-03-06 DIAGNOSIS — M170 Bilateral primary osteoarthritis of knee: Secondary | ICD-10-CM | POA: Diagnosis not present

## 2015-03-06 DIAGNOSIS — I509 Heart failure, unspecified: Secondary | ICD-10-CM | POA: Diagnosis not present

## 2015-03-06 DIAGNOSIS — I2510 Atherosclerotic heart disease of native coronary artery without angina pectoris: Secondary | ICD-10-CM | POA: Diagnosis not present

## 2015-03-06 DIAGNOSIS — F419 Anxiety disorder, unspecified: Secondary | ICD-10-CM | POA: Diagnosis not present

## 2015-03-06 DIAGNOSIS — J449 Chronic obstructive pulmonary disease, unspecified: Secondary | ICD-10-CM | POA: Diagnosis not present

## 2015-03-06 DIAGNOSIS — K5790 Diverticulosis of intestine, part unspecified, without perforation or abscess without bleeding: Secondary | ICD-10-CM | POA: Diagnosis not present

## 2015-03-06 DIAGNOSIS — F322 Major depressive disorder, single episode, severe without psychotic features: Secondary | ICD-10-CM | POA: Diagnosis not present

## 2015-03-06 DIAGNOSIS — I129 Hypertensive chronic kidney disease with stage 1 through stage 4 chronic kidney disease, or unspecified chronic kidney disease: Secondary | ICD-10-CM | POA: Diagnosis not present

## 2015-03-07 DIAGNOSIS — I509 Heart failure, unspecified: Secondary | ICD-10-CM | POA: Diagnosis not present

## 2015-03-07 DIAGNOSIS — M810 Age-related osteoporosis without current pathological fracture: Secondary | ICD-10-CM | POA: Diagnosis not present

## 2015-03-07 DIAGNOSIS — I252 Old myocardial infarction: Secondary | ICD-10-CM | POA: Diagnosis not present

## 2015-03-07 DIAGNOSIS — I2510 Atherosclerotic heart disease of native coronary artery without angina pectoris: Secondary | ICD-10-CM | POA: Diagnosis not present

## 2015-03-07 DIAGNOSIS — N183 Chronic kidney disease, stage 3 (moderate): Secondary | ICD-10-CM | POA: Diagnosis not present

## 2015-03-07 DIAGNOSIS — J449 Chronic obstructive pulmonary disease, unspecified: Secondary | ICD-10-CM | POA: Diagnosis not present

## 2015-03-07 DIAGNOSIS — M170 Bilateral primary osteoarthritis of knee: Secondary | ICD-10-CM | POA: Diagnosis not present

## 2015-03-07 DIAGNOSIS — I129 Hypertensive chronic kidney disease with stage 1 through stage 4 chronic kidney disease, or unspecified chronic kidney disease: Secondary | ICD-10-CM | POA: Diagnosis not present

## 2015-03-07 DIAGNOSIS — F322 Major depressive disorder, single episode, severe without psychotic features: Secondary | ICD-10-CM | POA: Diagnosis not present

## 2015-03-07 DIAGNOSIS — G2581 Restless legs syndrome: Secondary | ICD-10-CM | POA: Diagnosis not present

## 2015-03-07 DIAGNOSIS — K5790 Diverticulosis of intestine, part unspecified, without perforation or abscess without bleeding: Secondary | ICD-10-CM | POA: Diagnosis not present

## 2015-03-07 DIAGNOSIS — M1990 Unspecified osteoarthritis, unspecified site: Secondary | ICD-10-CM | POA: Diagnosis not present

## 2015-03-07 DIAGNOSIS — F419 Anxiety disorder, unspecified: Secondary | ICD-10-CM | POA: Diagnosis not present

## 2015-03-08 DIAGNOSIS — J449 Chronic obstructive pulmonary disease, unspecified: Secondary | ICD-10-CM | POA: Diagnosis not present

## 2015-03-08 DIAGNOSIS — M810 Age-related osteoporosis without current pathological fracture: Secondary | ICD-10-CM | POA: Diagnosis not present

## 2015-03-08 DIAGNOSIS — F322 Major depressive disorder, single episode, severe without psychotic features: Secondary | ICD-10-CM | POA: Diagnosis not present

## 2015-03-08 DIAGNOSIS — M1990 Unspecified osteoarthritis, unspecified site: Secondary | ICD-10-CM | POA: Diagnosis not present

## 2015-03-08 DIAGNOSIS — G2581 Restless legs syndrome: Secondary | ICD-10-CM | POA: Diagnosis not present

## 2015-03-08 DIAGNOSIS — I2510 Atherosclerotic heart disease of native coronary artery without angina pectoris: Secondary | ICD-10-CM | POA: Diagnosis not present

## 2015-03-08 DIAGNOSIS — K5790 Diverticulosis of intestine, part unspecified, without perforation or abscess without bleeding: Secondary | ICD-10-CM | POA: Diagnosis not present

## 2015-03-08 DIAGNOSIS — I129 Hypertensive chronic kidney disease with stage 1 through stage 4 chronic kidney disease, or unspecified chronic kidney disease: Secondary | ICD-10-CM | POA: Diagnosis not present

## 2015-03-08 DIAGNOSIS — M170 Bilateral primary osteoarthritis of knee: Secondary | ICD-10-CM | POA: Diagnosis not present

## 2015-03-08 DIAGNOSIS — I252 Old myocardial infarction: Secondary | ICD-10-CM | POA: Diagnosis not present

## 2015-03-08 DIAGNOSIS — I509 Heart failure, unspecified: Secondary | ICD-10-CM | POA: Diagnosis not present

## 2015-03-08 DIAGNOSIS — N183 Chronic kidney disease, stage 3 (moderate): Secondary | ICD-10-CM | POA: Diagnosis not present

## 2015-03-08 DIAGNOSIS — F419 Anxiety disorder, unspecified: Secondary | ICD-10-CM | POA: Diagnosis not present

## 2015-03-11 DIAGNOSIS — I2510 Atherosclerotic heart disease of native coronary artery without angina pectoris: Secondary | ICD-10-CM | POA: Diagnosis not present

## 2015-03-11 DIAGNOSIS — F322 Major depressive disorder, single episode, severe without psychotic features: Secondary | ICD-10-CM | POA: Diagnosis not present

## 2015-03-11 DIAGNOSIS — M170 Bilateral primary osteoarthritis of knee: Secondary | ICD-10-CM | POA: Diagnosis not present

## 2015-03-11 DIAGNOSIS — I129 Hypertensive chronic kidney disease with stage 1 through stage 4 chronic kidney disease, or unspecified chronic kidney disease: Secondary | ICD-10-CM | POA: Diagnosis not present

## 2015-03-11 DIAGNOSIS — K5790 Diverticulosis of intestine, part unspecified, without perforation or abscess without bleeding: Secondary | ICD-10-CM | POA: Diagnosis not present

## 2015-03-11 DIAGNOSIS — I509 Heart failure, unspecified: Secondary | ICD-10-CM | POA: Diagnosis not present

## 2015-03-11 DIAGNOSIS — N183 Chronic kidney disease, stage 3 (moderate): Secondary | ICD-10-CM | POA: Diagnosis not present

## 2015-03-11 DIAGNOSIS — I252 Old myocardial infarction: Secondary | ICD-10-CM | POA: Diagnosis not present

## 2015-03-11 DIAGNOSIS — F419 Anxiety disorder, unspecified: Secondary | ICD-10-CM | POA: Diagnosis not present

## 2015-03-11 DIAGNOSIS — M810 Age-related osteoporosis without current pathological fracture: Secondary | ICD-10-CM | POA: Diagnosis not present

## 2015-03-11 DIAGNOSIS — G2581 Restless legs syndrome: Secondary | ICD-10-CM | POA: Diagnosis not present

## 2015-03-11 DIAGNOSIS — M1990 Unspecified osteoarthritis, unspecified site: Secondary | ICD-10-CM | POA: Diagnosis not present

## 2015-03-11 DIAGNOSIS — J449 Chronic obstructive pulmonary disease, unspecified: Secondary | ICD-10-CM | POA: Diagnosis not present

## 2015-03-12 DIAGNOSIS — N183 Chronic kidney disease, stage 3 (moderate): Secondary | ICD-10-CM | POA: Diagnosis not present

## 2015-03-12 DIAGNOSIS — M170 Bilateral primary osteoarthritis of knee: Secondary | ICD-10-CM | POA: Diagnosis not present

## 2015-03-12 DIAGNOSIS — M1990 Unspecified osteoarthritis, unspecified site: Secondary | ICD-10-CM | POA: Diagnosis not present

## 2015-03-12 DIAGNOSIS — F322 Major depressive disorder, single episode, severe without psychotic features: Secondary | ICD-10-CM | POA: Diagnosis not present

## 2015-03-12 DIAGNOSIS — K5790 Diverticulosis of intestine, part unspecified, without perforation or abscess without bleeding: Secondary | ICD-10-CM | POA: Diagnosis not present

## 2015-03-12 DIAGNOSIS — J449 Chronic obstructive pulmonary disease, unspecified: Secondary | ICD-10-CM | POA: Diagnosis not present

## 2015-03-12 DIAGNOSIS — I2510 Atherosclerotic heart disease of native coronary artery without angina pectoris: Secondary | ICD-10-CM | POA: Diagnosis not present

## 2015-03-12 DIAGNOSIS — M810 Age-related osteoporosis without current pathological fracture: Secondary | ICD-10-CM | POA: Diagnosis not present

## 2015-03-12 DIAGNOSIS — I252 Old myocardial infarction: Secondary | ICD-10-CM | POA: Diagnosis not present

## 2015-03-12 DIAGNOSIS — F419 Anxiety disorder, unspecified: Secondary | ICD-10-CM | POA: Diagnosis not present

## 2015-03-12 DIAGNOSIS — G2581 Restless legs syndrome: Secondary | ICD-10-CM | POA: Diagnosis not present

## 2015-03-12 DIAGNOSIS — I129 Hypertensive chronic kidney disease with stage 1 through stage 4 chronic kidney disease, or unspecified chronic kidney disease: Secondary | ICD-10-CM | POA: Diagnosis not present

## 2015-03-12 DIAGNOSIS — I509 Heart failure, unspecified: Secondary | ICD-10-CM | POA: Diagnosis not present

## 2015-03-13 DIAGNOSIS — M810 Age-related osteoporosis without current pathological fracture: Secondary | ICD-10-CM | POA: Diagnosis not present

## 2015-03-13 DIAGNOSIS — I252 Old myocardial infarction: Secondary | ICD-10-CM | POA: Diagnosis not present

## 2015-03-13 DIAGNOSIS — F419 Anxiety disorder, unspecified: Secondary | ICD-10-CM | POA: Diagnosis not present

## 2015-03-13 DIAGNOSIS — G2581 Restless legs syndrome: Secondary | ICD-10-CM | POA: Diagnosis not present

## 2015-03-13 DIAGNOSIS — N183 Chronic kidney disease, stage 3 (moderate): Secondary | ICD-10-CM | POA: Diagnosis not present

## 2015-03-13 DIAGNOSIS — M1990 Unspecified osteoarthritis, unspecified site: Secondary | ICD-10-CM | POA: Diagnosis not present

## 2015-03-13 DIAGNOSIS — J449 Chronic obstructive pulmonary disease, unspecified: Secondary | ICD-10-CM | POA: Diagnosis not present

## 2015-03-13 DIAGNOSIS — I2510 Atherosclerotic heart disease of native coronary artery without angina pectoris: Secondary | ICD-10-CM | POA: Diagnosis not present

## 2015-03-13 DIAGNOSIS — M170 Bilateral primary osteoarthritis of knee: Secondary | ICD-10-CM | POA: Diagnosis not present

## 2015-03-13 DIAGNOSIS — F322 Major depressive disorder, single episode, severe without psychotic features: Secondary | ICD-10-CM | POA: Diagnosis not present

## 2015-03-13 DIAGNOSIS — I509 Heart failure, unspecified: Secondary | ICD-10-CM | POA: Diagnosis not present

## 2015-03-13 DIAGNOSIS — K5790 Diverticulosis of intestine, part unspecified, without perforation or abscess without bleeding: Secondary | ICD-10-CM | POA: Diagnosis not present

## 2015-03-13 DIAGNOSIS — I129 Hypertensive chronic kidney disease with stage 1 through stage 4 chronic kidney disease, or unspecified chronic kidney disease: Secondary | ICD-10-CM | POA: Diagnosis not present

## 2015-03-14 DIAGNOSIS — K5790 Diverticulosis of intestine, part unspecified, without perforation or abscess without bleeding: Secondary | ICD-10-CM | POA: Diagnosis not present

## 2015-03-14 DIAGNOSIS — F419 Anxiety disorder, unspecified: Secondary | ICD-10-CM | POA: Diagnosis not present

## 2015-03-14 DIAGNOSIS — I2510 Atherosclerotic heart disease of native coronary artery without angina pectoris: Secondary | ICD-10-CM | POA: Diagnosis not present

## 2015-03-14 DIAGNOSIS — G2581 Restless legs syndrome: Secondary | ICD-10-CM | POA: Diagnosis not present

## 2015-03-14 DIAGNOSIS — J449 Chronic obstructive pulmonary disease, unspecified: Secondary | ICD-10-CM | POA: Diagnosis not present

## 2015-03-14 DIAGNOSIS — I129 Hypertensive chronic kidney disease with stage 1 through stage 4 chronic kidney disease, or unspecified chronic kidney disease: Secondary | ICD-10-CM | POA: Diagnosis not present

## 2015-03-14 DIAGNOSIS — M170 Bilateral primary osteoarthritis of knee: Secondary | ICD-10-CM | POA: Diagnosis not present

## 2015-03-14 DIAGNOSIS — I509 Heart failure, unspecified: Secondary | ICD-10-CM | POA: Diagnosis not present

## 2015-03-14 DIAGNOSIS — I252 Old myocardial infarction: Secondary | ICD-10-CM | POA: Diagnosis not present

## 2015-03-14 DIAGNOSIS — M1990 Unspecified osteoarthritis, unspecified site: Secondary | ICD-10-CM | POA: Diagnosis not present

## 2015-03-14 DIAGNOSIS — F322 Major depressive disorder, single episode, severe without psychotic features: Secondary | ICD-10-CM | POA: Diagnosis not present

## 2015-03-14 DIAGNOSIS — N183 Chronic kidney disease, stage 3 (moderate): Secondary | ICD-10-CM | POA: Diagnosis not present

## 2015-03-14 DIAGNOSIS — M810 Age-related osteoporosis without current pathological fracture: Secondary | ICD-10-CM | POA: Diagnosis not present

## 2015-03-15 DIAGNOSIS — M1990 Unspecified osteoarthritis, unspecified site: Secondary | ICD-10-CM | POA: Diagnosis not present

## 2015-03-15 DIAGNOSIS — M170 Bilateral primary osteoarthritis of knee: Secondary | ICD-10-CM | POA: Diagnosis not present

## 2015-03-15 DIAGNOSIS — M810 Age-related osteoporosis without current pathological fracture: Secondary | ICD-10-CM | POA: Diagnosis not present

## 2015-03-15 DIAGNOSIS — N183 Chronic kidney disease, stage 3 (moderate): Secondary | ICD-10-CM | POA: Diagnosis not present

## 2015-03-15 DIAGNOSIS — G2581 Restless legs syndrome: Secondary | ICD-10-CM | POA: Diagnosis not present

## 2015-03-15 DIAGNOSIS — F322 Major depressive disorder, single episode, severe without psychotic features: Secondary | ICD-10-CM | POA: Diagnosis not present

## 2015-03-15 DIAGNOSIS — I129 Hypertensive chronic kidney disease with stage 1 through stage 4 chronic kidney disease, or unspecified chronic kidney disease: Secondary | ICD-10-CM | POA: Diagnosis not present

## 2015-03-15 DIAGNOSIS — I252 Old myocardial infarction: Secondary | ICD-10-CM | POA: Diagnosis not present

## 2015-03-15 DIAGNOSIS — I2510 Atherosclerotic heart disease of native coronary artery without angina pectoris: Secondary | ICD-10-CM | POA: Diagnosis not present

## 2015-03-15 DIAGNOSIS — J449 Chronic obstructive pulmonary disease, unspecified: Secondary | ICD-10-CM | POA: Diagnosis not present

## 2015-03-15 DIAGNOSIS — I509 Heart failure, unspecified: Secondary | ICD-10-CM | POA: Diagnosis not present

## 2015-03-15 DIAGNOSIS — F419 Anxiety disorder, unspecified: Secondary | ICD-10-CM | POA: Diagnosis not present

## 2015-03-15 DIAGNOSIS — K5790 Diverticulosis of intestine, part unspecified, without perforation or abscess without bleeding: Secondary | ICD-10-CM | POA: Diagnosis not present

## 2015-03-18 DIAGNOSIS — M810 Age-related osteoporosis without current pathological fracture: Secondary | ICD-10-CM | POA: Diagnosis not present

## 2015-03-18 DIAGNOSIS — I129 Hypertensive chronic kidney disease with stage 1 through stage 4 chronic kidney disease, or unspecified chronic kidney disease: Secondary | ICD-10-CM | POA: Diagnosis not present

## 2015-03-18 DIAGNOSIS — I252 Old myocardial infarction: Secondary | ICD-10-CM | POA: Diagnosis not present

## 2015-03-18 DIAGNOSIS — F322 Major depressive disorder, single episode, severe without psychotic features: Secondary | ICD-10-CM | POA: Diagnosis not present

## 2015-03-18 DIAGNOSIS — J449 Chronic obstructive pulmonary disease, unspecified: Secondary | ICD-10-CM | POA: Diagnosis not present

## 2015-03-18 DIAGNOSIS — I509 Heart failure, unspecified: Secondary | ICD-10-CM | POA: Diagnosis not present

## 2015-03-18 DIAGNOSIS — N183 Chronic kidney disease, stage 3 (moderate): Secondary | ICD-10-CM | POA: Diagnosis not present

## 2015-03-18 DIAGNOSIS — M170 Bilateral primary osteoarthritis of knee: Secondary | ICD-10-CM | POA: Diagnosis not present

## 2015-03-18 DIAGNOSIS — G2581 Restless legs syndrome: Secondary | ICD-10-CM | POA: Diagnosis not present

## 2015-03-18 DIAGNOSIS — M1990 Unspecified osteoarthritis, unspecified site: Secondary | ICD-10-CM | POA: Diagnosis not present

## 2015-03-18 DIAGNOSIS — F419 Anxiety disorder, unspecified: Secondary | ICD-10-CM | POA: Diagnosis not present

## 2015-03-18 DIAGNOSIS — I2510 Atherosclerotic heart disease of native coronary artery without angina pectoris: Secondary | ICD-10-CM | POA: Diagnosis not present

## 2015-03-18 DIAGNOSIS — K5790 Diverticulosis of intestine, part unspecified, without perforation or abscess without bleeding: Secondary | ICD-10-CM | POA: Diagnosis not present

## 2015-03-19 DIAGNOSIS — I509 Heart failure, unspecified: Secondary | ICD-10-CM | POA: Diagnosis not present

## 2015-03-19 DIAGNOSIS — F322 Major depressive disorder, single episode, severe without psychotic features: Secondary | ICD-10-CM | POA: Diagnosis not present

## 2015-03-19 DIAGNOSIS — F419 Anxiety disorder, unspecified: Secondary | ICD-10-CM | POA: Diagnosis not present

## 2015-03-19 DIAGNOSIS — I129 Hypertensive chronic kidney disease with stage 1 through stage 4 chronic kidney disease, or unspecified chronic kidney disease: Secondary | ICD-10-CM | POA: Diagnosis not present

## 2015-03-19 DIAGNOSIS — G2581 Restless legs syndrome: Secondary | ICD-10-CM | POA: Diagnosis not present

## 2015-03-19 DIAGNOSIS — M810 Age-related osteoporosis without current pathological fracture: Secondary | ICD-10-CM | POA: Diagnosis not present

## 2015-03-19 DIAGNOSIS — N183 Chronic kidney disease, stage 3 (moderate): Secondary | ICD-10-CM | POA: Diagnosis not present

## 2015-03-19 DIAGNOSIS — J449 Chronic obstructive pulmonary disease, unspecified: Secondary | ICD-10-CM | POA: Diagnosis not present

## 2015-03-19 DIAGNOSIS — I2510 Atherosclerotic heart disease of native coronary artery without angina pectoris: Secondary | ICD-10-CM | POA: Diagnosis not present

## 2015-03-19 DIAGNOSIS — M1990 Unspecified osteoarthritis, unspecified site: Secondary | ICD-10-CM | POA: Diagnosis not present

## 2015-03-19 DIAGNOSIS — K5790 Diverticulosis of intestine, part unspecified, without perforation or abscess without bleeding: Secondary | ICD-10-CM | POA: Diagnosis not present

## 2015-03-19 DIAGNOSIS — M170 Bilateral primary osteoarthritis of knee: Secondary | ICD-10-CM | POA: Diagnosis not present

## 2015-03-19 DIAGNOSIS — I252 Old myocardial infarction: Secondary | ICD-10-CM | POA: Diagnosis not present

## 2015-03-20 ENCOUNTER — Telehealth: Payer: Self-pay

## 2015-03-20 DIAGNOSIS — I509 Heart failure, unspecified: Secondary | ICD-10-CM | POA: Diagnosis not present

## 2015-03-20 DIAGNOSIS — M7581 Other shoulder lesions, right shoulder: Secondary | ICD-10-CM | POA: Diagnosis not present

## 2015-03-20 DIAGNOSIS — G2581 Restless legs syndrome: Secondary | ICD-10-CM | POA: Diagnosis not present

## 2015-03-20 DIAGNOSIS — M25552 Pain in left hip: Secondary | ICD-10-CM | POA: Diagnosis not present

## 2015-03-20 DIAGNOSIS — F419 Anxiety disorder, unspecified: Secondary | ICD-10-CM | POA: Diagnosis not present

## 2015-03-20 DIAGNOSIS — I2510 Atherosclerotic heart disease of native coronary artery without angina pectoris: Secondary | ICD-10-CM | POA: Diagnosis not present

## 2015-03-20 DIAGNOSIS — K5790 Diverticulosis of intestine, part unspecified, without perforation or abscess without bleeding: Secondary | ICD-10-CM | POA: Diagnosis not present

## 2015-03-20 DIAGNOSIS — M1712 Unilateral primary osteoarthritis, left knee: Secondary | ICD-10-CM | POA: Diagnosis not present

## 2015-03-20 DIAGNOSIS — I129 Hypertensive chronic kidney disease with stage 1 through stage 4 chronic kidney disease, or unspecified chronic kidney disease: Secondary | ICD-10-CM | POA: Diagnosis not present

## 2015-03-20 DIAGNOSIS — M1711 Unilateral primary osteoarthritis, right knee: Secondary | ICD-10-CM | POA: Diagnosis not present

## 2015-03-20 DIAGNOSIS — M170 Bilateral primary osteoarthritis of knee: Secondary | ICD-10-CM | POA: Diagnosis not present

## 2015-03-20 DIAGNOSIS — N183 Chronic kidney disease, stage 3 (moderate): Secondary | ICD-10-CM | POA: Diagnosis not present

## 2015-03-20 DIAGNOSIS — M1990 Unspecified osteoarthritis, unspecified site: Secondary | ICD-10-CM | POA: Diagnosis not present

## 2015-03-20 DIAGNOSIS — F322 Major depressive disorder, single episode, severe without psychotic features: Secondary | ICD-10-CM | POA: Diagnosis not present

## 2015-03-20 DIAGNOSIS — J449 Chronic obstructive pulmonary disease, unspecified: Secondary | ICD-10-CM | POA: Diagnosis not present

## 2015-03-20 DIAGNOSIS — M810 Age-related osteoporosis without current pathological fracture: Secondary | ICD-10-CM | POA: Diagnosis not present

## 2015-03-20 DIAGNOSIS — I252 Old myocardial infarction: Secondary | ICD-10-CM | POA: Diagnosis not present

## 2015-03-20 NOTE — Telephone Encounter (Signed)
That is okay.

## 2015-03-20 NOTE — Telephone Encounter (Signed)
Spoke with nurse and advised results  

## 2015-03-20 NOTE — Telephone Encounter (Signed)
[name] OT with [facility] left v/m requesting verbal orders to extend home health OT orders to 2 x a week for 4 weeks.

## 2015-03-20 NOTE — Telephone Encounter (Signed)
Pt left v/m; pt saw orthopedic doctor today and was advised to stop lexapro 10 mg and start cymbalta 30 mg daily for one week and then can increase to 2 capsules per day which will help with arthritis pain. Ortho would not change med without getting Dr Everardo Beals approval. Pt has Cymbalta rx that she can fill if OK with Dr Silvio Pate; pt request cb.

## 2015-03-21 DIAGNOSIS — I129 Hypertensive chronic kidney disease with stage 1 through stage 4 chronic kidney disease, or unspecified chronic kidney disease: Secondary | ICD-10-CM | POA: Diagnosis not present

## 2015-03-21 DIAGNOSIS — N183 Chronic kidney disease, stage 3 (moderate): Secondary | ICD-10-CM | POA: Diagnosis not present

## 2015-03-21 DIAGNOSIS — F419 Anxiety disorder, unspecified: Secondary | ICD-10-CM | POA: Diagnosis not present

## 2015-03-21 DIAGNOSIS — J449 Chronic obstructive pulmonary disease, unspecified: Secondary | ICD-10-CM | POA: Diagnosis not present

## 2015-03-21 DIAGNOSIS — I2510 Atherosclerotic heart disease of native coronary artery without angina pectoris: Secondary | ICD-10-CM | POA: Diagnosis not present

## 2015-03-21 DIAGNOSIS — M810 Age-related osteoporosis without current pathological fracture: Secondary | ICD-10-CM | POA: Diagnosis not present

## 2015-03-21 DIAGNOSIS — G2581 Restless legs syndrome: Secondary | ICD-10-CM | POA: Diagnosis not present

## 2015-03-21 DIAGNOSIS — F322 Major depressive disorder, single episode, severe without psychotic features: Secondary | ICD-10-CM | POA: Diagnosis not present

## 2015-03-21 DIAGNOSIS — M170 Bilateral primary osteoarthritis of knee: Secondary | ICD-10-CM | POA: Diagnosis not present

## 2015-03-21 DIAGNOSIS — M1990 Unspecified osteoarthritis, unspecified site: Secondary | ICD-10-CM | POA: Diagnosis not present

## 2015-03-21 DIAGNOSIS — I509 Heart failure, unspecified: Secondary | ICD-10-CM | POA: Diagnosis not present

## 2015-03-21 DIAGNOSIS — I252 Old myocardial infarction: Secondary | ICD-10-CM | POA: Diagnosis not present

## 2015-03-21 DIAGNOSIS — K5790 Diverticulosis of intestine, part unspecified, without perforation or abscess without bleeding: Secondary | ICD-10-CM | POA: Diagnosis not present

## 2015-03-21 NOTE — Telephone Encounter (Signed)
cymbalta does not help arthritis pain but can help neuropathic pain and chronic muscle pain (like fibromyalgia). It is possible that it could give her some relief  I am okay with her trying the change if she wants to but let me know if she has any problems

## 2015-03-22 NOTE — Telephone Encounter (Signed)
Spoke with patient and she will stick with what she's been taking, she read about the side effects and doesn't want more problems.

## 2015-03-22 NOTE — Telephone Encounter (Signed)
.  left message to have patient return my call.  

## 2015-03-25 DIAGNOSIS — M170 Bilateral primary osteoarthritis of knee: Secondary | ICD-10-CM | POA: Diagnosis not present

## 2015-03-25 DIAGNOSIS — G2581 Restless legs syndrome: Secondary | ICD-10-CM | POA: Diagnosis not present

## 2015-03-25 DIAGNOSIS — J449 Chronic obstructive pulmonary disease, unspecified: Secondary | ICD-10-CM | POA: Diagnosis not present

## 2015-03-25 DIAGNOSIS — I2510 Atherosclerotic heart disease of native coronary artery without angina pectoris: Secondary | ICD-10-CM | POA: Diagnosis not present

## 2015-03-25 DIAGNOSIS — I509 Heart failure, unspecified: Secondary | ICD-10-CM | POA: Diagnosis not present

## 2015-03-25 DIAGNOSIS — M1990 Unspecified osteoarthritis, unspecified site: Secondary | ICD-10-CM | POA: Diagnosis not present

## 2015-03-25 DIAGNOSIS — F419 Anxiety disorder, unspecified: Secondary | ICD-10-CM | POA: Diagnosis not present

## 2015-03-25 DIAGNOSIS — I129 Hypertensive chronic kidney disease with stage 1 through stage 4 chronic kidney disease, or unspecified chronic kidney disease: Secondary | ICD-10-CM | POA: Diagnosis not present

## 2015-03-25 DIAGNOSIS — F322 Major depressive disorder, single episode, severe without psychotic features: Secondary | ICD-10-CM | POA: Diagnosis not present

## 2015-03-25 DIAGNOSIS — N183 Chronic kidney disease, stage 3 (moderate): Secondary | ICD-10-CM | POA: Diagnosis not present

## 2015-03-25 DIAGNOSIS — K5790 Diverticulosis of intestine, part unspecified, without perforation or abscess without bleeding: Secondary | ICD-10-CM | POA: Diagnosis not present

## 2015-03-25 DIAGNOSIS — I252 Old myocardial infarction: Secondary | ICD-10-CM | POA: Diagnosis not present

## 2015-03-25 DIAGNOSIS — M810 Age-related osteoporosis without current pathological fracture: Secondary | ICD-10-CM | POA: Diagnosis not present

## 2015-03-26 DIAGNOSIS — G2581 Restless legs syndrome: Secondary | ICD-10-CM | POA: Diagnosis not present

## 2015-03-26 DIAGNOSIS — K5790 Diverticulosis of intestine, part unspecified, without perforation or abscess without bleeding: Secondary | ICD-10-CM | POA: Diagnosis not present

## 2015-03-26 DIAGNOSIS — N183 Chronic kidney disease, stage 3 (moderate): Secondary | ICD-10-CM | POA: Diagnosis not present

## 2015-03-26 DIAGNOSIS — I252 Old myocardial infarction: Secondary | ICD-10-CM | POA: Diagnosis not present

## 2015-03-26 DIAGNOSIS — M810 Age-related osteoporosis without current pathological fracture: Secondary | ICD-10-CM | POA: Diagnosis not present

## 2015-03-26 DIAGNOSIS — M170 Bilateral primary osteoarthritis of knee: Secondary | ICD-10-CM | POA: Diagnosis not present

## 2015-03-26 DIAGNOSIS — F419 Anxiety disorder, unspecified: Secondary | ICD-10-CM | POA: Diagnosis not present

## 2015-03-26 DIAGNOSIS — J449 Chronic obstructive pulmonary disease, unspecified: Secondary | ICD-10-CM | POA: Diagnosis not present

## 2015-03-26 DIAGNOSIS — M1990 Unspecified osteoarthritis, unspecified site: Secondary | ICD-10-CM | POA: Diagnosis not present

## 2015-03-26 DIAGNOSIS — I129 Hypertensive chronic kidney disease with stage 1 through stage 4 chronic kidney disease, or unspecified chronic kidney disease: Secondary | ICD-10-CM | POA: Diagnosis not present

## 2015-03-26 DIAGNOSIS — F322 Major depressive disorder, single episode, severe without psychotic features: Secondary | ICD-10-CM | POA: Diagnosis not present

## 2015-03-26 DIAGNOSIS — I509 Heart failure, unspecified: Secondary | ICD-10-CM | POA: Diagnosis not present

## 2015-03-26 DIAGNOSIS — I2510 Atherosclerotic heart disease of native coronary artery without angina pectoris: Secondary | ICD-10-CM | POA: Diagnosis not present

## 2015-03-27 ENCOUNTER — Ambulatory Visit (INDEPENDENT_AMBULATORY_CARE_PROVIDER_SITE_OTHER): Payer: Medicare Other | Admitting: Podiatry

## 2015-03-27 ENCOUNTER — Telehealth: Payer: Self-pay

## 2015-03-27 ENCOUNTER — Encounter: Payer: Self-pay | Admitting: Podiatry

## 2015-03-27 VITALS — BP 145/78 | HR 89

## 2015-03-27 DIAGNOSIS — I129 Hypertensive chronic kidney disease with stage 1 through stage 4 chronic kidney disease, or unspecified chronic kidney disease: Secondary | ICD-10-CM | POA: Diagnosis not present

## 2015-03-27 DIAGNOSIS — M79676 Pain in unspecified toe(s): Secondary | ICD-10-CM | POA: Diagnosis not present

## 2015-03-27 DIAGNOSIS — F322 Major depressive disorder, single episode, severe without psychotic features: Secondary | ICD-10-CM | POA: Diagnosis not present

## 2015-03-27 DIAGNOSIS — G2581 Restless legs syndrome: Secondary | ICD-10-CM | POA: Diagnosis not present

## 2015-03-27 DIAGNOSIS — I509 Heart failure, unspecified: Secondary | ICD-10-CM | POA: Diagnosis not present

## 2015-03-27 DIAGNOSIS — K5790 Diverticulosis of intestine, part unspecified, without perforation or abscess without bleeding: Secondary | ICD-10-CM | POA: Diagnosis not present

## 2015-03-27 DIAGNOSIS — I252 Old myocardial infarction: Secondary | ICD-10-CM | POA: Diagnosis not present

## 2015-03-27 DIAGNOSIS — I2510 Atherosclerotic heart disease of native coronary artery without angina pectoris: Secondary | ICD-10-CM | POA: Diagnosis not present

## 2015-03-27 DIAGNOSIS — M1990 Unspecified osteoarthritis, unspecified site: Secondary | ICD-10-CM | POA: Diagnosis not present

## 2015-03-27 DIAGNOSIS — B351 Tinea unguium: Secondary | ICD-10-CM | POA: Diagnosis not present

## 2015-03-27 DIAGNOSIS — J449 Chronic obstructive pulmonary disease, unspecified: Secondary | ICD-10-CM | POA: Diagnosis not present

## 2015-03-27 DIAGNOSIS — M170 Bilateral primary osteoarthritis of knee: Secondary | ICD-10-CM | POA: Diagnosis not present

## 2015-03-27 DIAGNOSIS — N183 Chronic kidney disease, stage 3 (moderate): Secondary | ICD-10-CM | POA: Diagnosis not present

## 2015-03-27 DIAGNOSIS — M810 Age-related osteoporosis without current pathological fracture: Secondary | ICD-10-CM | POA: Diagnosis not present

## 2015-03-27 DIAGNOSIS — F419 Anxiety disorder, unspecified: Secondary | ICD-10-CM | POA: Diagnosis not present

## 2015-03-27 NOTE — Progress Notes (Signed)
She presents today with a chief complaint of painful elongated toenails. She states that her nails need to be cut.  Objective: Vital signs are stable she is alert and oriented 3. Pulses are strongly palpable bilateral. Neurologic sensorium is intact. Toenails are thick yellow dystrophic with mycotic painful palpation.  Assessment: Pain in limb secondary to onychomycosis 1 through 5 bilateral.  Plan: Debridement of nails 1 through 5 bilateral covered service secondary to pain.

## 2015-03-27 NOTE — Telephone Encounter (Signed)
Marlowe Kays OT with [facility] left v/m requesting verbal order to increase frequency of home health aide to 2 x a week for 3 weeks.

## 2015-03-28 ENCOUNTER — Telehealth: Payer: Self-pay

## 2015-03-28 DIAGNOSIS — M170 Bilateral primary osteoarthritis of knee: Secondary | ICD-10-CM | POA: Diagnosis not present

## 2015-03-28 DIAGNOSIS — M810 Age-related osteoporosis without current pathological fracture: Secondary | ICD-10-CM | POA: Diagnosis not present

## 2015-03-28 DIAGNOSIS — I252 Old myocardial infarction: Secondary | ICD-10-CM | POA: Diagnosis not present

## 2015-03-28 DIAGNOSIS — J449 Chronic obstructive pulmonary disease, unspecified: Secondary | ICD-10-CM | POA: Diagnosis not present

## 2015-03-28 DIAGNOSIS — F322 Major depressive disorder, single episode, severe without psychotic features: Secondary | ICD-10-CM | POA: Diagnosis not present

## 2015-03-28 DIAGNOSIS — I2510 Atherosclerotic heart disease of native coronary artery without angina pectoris: Secondary | ICD-10-CM | POA: Diagnosis not present

## 2015-03-28 DIAGNOSIS — G2581 Restless legs syndrome: Secondary | ICD-10-CM | POA: Diagnosis not present

## 2015-03-28 DIAGNOSIS — I509 Heart failure, unspecified: Secondary | ICD-10-CM | POA: Diagnosis not present

## 2015-03-28 DIAGNOSIS — I129 Hypertensive chronic kidney disease with stage 1 through stage 4 chronic kidney disease, or unspecified chronic kidney disease: Secondary | ICD-10-CM | POA: Diagnosis not present

## 2015-03-28 DIAGNOSIS — M1990 Unspecified osteoarthritis, unspecified site: Secondary | ICD-10-CM | POA: Diagnosis not present

## 2015-03-28 DIAGNOSIS — F419 Anxiety disorder, unspecified: Secondary | ICD-10-CM | POA: Diagnosis not present

## 2015-03-28 DIAGNOSIS — K5790 Diverticulosis of intestine, part unspecified, without perforation or abscess without bleeding: Secondary | ICD-10-CM | POA: Diagnosis not present

## 2015-03-28 DIAGNOSIS — N183 Chronic kidney disease, stage 3 (moderate): Secondary | ICD-10-CM | POA: Diagnosis not present

## 2015-03-28 NOTE — Telephone Encounter (Signed)
Merry Proud nurse with [facility] left v/m requesting verbal order home health skilled nursing visits 1 x a week for 2 weeks that will extend to end of certification period and then verbal order to recertify pt on 46/65/9935.

## 2015-03-28 NOTE — Telephone Encounter (Signed)
Check with her to see if she still needs the home health visits first. If she is getting benefit from them, okay to recertify

## 2015-03-28 NOTE — Telephone Encounter (Signed)
Patient called to ask for order for home health to be called in.  Patient is worried she'll have to wait until Monday to take a shower.  I let her know Dr.Letvak will be in this afternoon.

## 2015-03-28 NOTE — Telephone Encounter (Signed)
Spoke with nurse and advised results  

## 2015-03-28 NOTE — Telephone Encounter (Signed)
Okay to increase aide time

## 2015-03-29 NOTE — Telephone Encounter (Signed)
.  left message to have patient return my call.  

## 2015-03-29 NOTE — Telephone Encounter (Signed)
Spoke with patient and she would like to continue with the nurse visits  Left message on [place] VM with verbal ok

## 2015-03-29 NOTE — Telephone Encounter (Signed)
Patient returned Dee's call.  Please call patient back at [phone].

## 2015-04-01 DIAGNOSIS — I2510 Atherosclerotic heart disease of native coronary artery without angina pectoris: Secondary | ICD-10-CM | POA: Diagnosis not present

## 2015-04-01 DIAGNOSIS — I129 Hypertensive chronic kidney disease with stage 1 through stage 4 chronic kidney disease, or unspecified chronic kidney disease: Secondary | ICD-10-CM | POA: Diagnosis not present

## 2015-04-01 DIAGNOSIS — M810 Age-related osteoporosis without current pathological fracture: Secondary | ICD-10-CM | POA: Diagnosis not present

## 2015-04-01 DIAGNOSIS — K5790 Diverticulosis of intestine, part unspecified, without perforation or abscess without bleeding: Secondary | ICD-10-CM | POA: Diagnosis not present

## 2015-04-01 DIAGNOSIS — M170 Bilateral primary osteoarthritis of knee: Secondary | ICD-10-CM | POA: Diagnosis not present

## 2015-04-01 DIAGNOSIS — I252 Old myocardial infarction: Secondary | ICD-10-CM | POA: Diagnosis not present

## 2015-04-01 DIAGNOSIS — N183 Chronic kidney disease, stage 3 (moderate): Secondary | ICD-10-CM | POA: Diagnosis not present

## 2015-04-01 DIAGNOSIS — I509 Heart failure, unspecified: Secondary | ICD-10-CM | POA: Diagnosis not present

## 2015-04-01 DIAGNOSIS — M1990 Unspecified osteoarthritis, unspecified site: Secondary | ICD-10-CM | POA: Diagnosis not present

## 2015-04-01 DIAGNOSIS — G2581 Restless legs syndrome: Secondary | ICD-10-CM | POA: Diagnosis not present

## 2015-04-01 DIAGNOSIS — F322 Major depressive disorder, single episode, severe without psychotic features: Secondary | ICD-10-CM | POA: Diagnosis not present

## 2015-04-01 DIAGNOSIS — J449 Chronic obstructive pulmonary disease, unspecified: Secondary | ICD-10-CM | POA: Diagnosis not present

## 2015-04-01 DIAGNOSIS — F419 Anxiety disorder, unspecified: Secondary | ICD-10-CM | POA: Diagnosis not present

## 2015-04-02 DIAGNOSIS — K5790 Diverticulosis of intestine, part unspecified, without perforation or abscess without bleeding: Secondary | ICD-10-CM | POA: Diagnosis not present

## 2015-04-02 DIAGNOSIS — F322 Major depressive disorder, single episode, severe without psychotic features: Secondary | ICD-10-CM | POA: Diagnosis not present

## 2015-04-02 DIAGNOSIS — J449 Chronic obstructive pulmonary disease, unspecified: Secondary | ICD-10-CM | POA: Diagnosis not present

## 2015-04-02 DIAGNOSIS — N183 Chronic kidney disease, stage 3 (moderate): Secondary | ICD-10-CM | POA: Diagnosis not present

## 2015-04-02 DIAGNOSIS — M170 Bilateral primary osteoarthritis of knee: Secondary | ICD-10-CM | POA: Diagnosis not present

## 2015-04-02 DIAGNOSIS — I129 Hypertensive chronic kidney disease with stage 1 through stage 4 chronic kidney disease, or unspecified chronic kidney disease: Secondary | ICD-10-CM | POA: Diagnosis not present

## 2015-04-02 DIAGNOSIS — M810 Age-related osteoporosis without current pathological fracture: Secondary | ICD-10-CM | POA: Diagnosis not present

## 2015-04-02 DIAGNOSIS — I509 Heart failure, unspecified: Secondary | ICD-10-CM | POA: Diagnosis not present

## 2015-04-02 DIAGNOSIS — I2510 Atherosclerotic heart disease of native coronary artery without angina pectoris: Secondary | ICD-10-CM | POA: Diagnosis not present

## 2015-04-02 DIAGNOSIS — M1990 Unspecified osteoarthritis, unspecified site: Secondary | ICD-10-CM | POA: Diagnosis not present

## 2015-04-02 DIAGNOSIS — I252 Old myocardial infarction: Secondary | ICD-10-CM | POA: Diagnosis not present

## 2015-04-02 DIAGNOSIS — F419 Anxiety disorder, unspecified: Secondary | ICD-10-CM | POA: Diagnosis not present

## 2015-04-02 DIAGNOSIS — G2581 Restless legs syndrome: Secondary | ICD-10-CM | POA: Diagnosis not present

## 2015-04-03 DIAGNOSIS — G2581 Restless legs syndrome: Secondary | ICD-10-CM | POA: Diagnosis not present

## 2015-04-03 DIAGNOSIS — J449 Chronic obstructive pulmonary disease, unspecified: Secondary | ICD-10-CM | POA: Diagnosis not present

## 2015-04-03 DIAGNOSIS — M810 Age-related osteoporosis without current pathological fracture: Secondary | ICD-10-CM | POA: Diagnosis not present

## 2015-04-03 DIAGNOSIS — F419 Anxiety disorder, unspecified: Secondary | ICD-10-CM | POA: Diagnosis not present

## 2015-04-03 DIAGNOSIS — I509 Heart failure, unspecified: Secondary | ICD-10-CM | POA: Diagnosis not present

## 2015-04-03 DIAGNOSIS — F322 Major depressive disorder, single episode, severe without psychotic features: Secondary | ICD-10-CM | POA: Diagnosis not present

## 2015-04-03 DIAGNOSIS — N183 Chronic kidney disease, stage 3 (moderate): Secondary | ICD-10-CM | POA: Diagnosis not present

## 2015-04-03 DIAGNOSIS — I252 Old myocardial infarction: Secondary | ICD-10-CM | POA: Diagnosis not present

## 2015-04-03 DIAGNOSIS — M170 Bilateral primary osteoarthritis of knee: Secondary | ICD-10-CM | POA: Diagnosis not present

## 2015-04-03 DIAGNOSIS — K5790 Diverticulosis of intestine, part unspecified, without perforation or abscess without bleeding: Secondary | ICD-10-CM | POA: Diagnosis not present

## 2015-04-03 DIAGNOSIS — M1990 Unspecified osteoarthritis, unspecified site: Secondary | ICD-10-CM | POA: Diagnosis not present

## 2015-04-03 DIAGNOSIS — I2510 Atherosclerotic heart disease of native coronary artery without angina pectoris: Secondary | ICD-10-CM | POA: Diagnosis not present

## 2015-04-03 DIAGNOSIS — I129 Hypertensive chronic kidney disease with stage 1 through stage 4 chronic kidney disease, or unspecified chronic kidney disease: Secondary | ICD-10-CM | POA: Diagnosis not present

## 2015-04-04 DIAGNOSIS — I509 Heart failure, unspecified: Secondary | ICD-10-CM | POA: Diagnosis not present

## 2015-04-04 DIAGNOSIS — I129 Hypertensive chronic kidney disease with stage 1 through stage 4 chronic kidney disease, or unspecified chronic kidney disease: Secondary | ICD-10-CM | POA: Diagnosis not present

## 2015-04-04 DIAGNOSIS — M170 Bilateral primary osteoarthritis of knee: Secondary | ICD-10-CM | POA: Diagnosis not present

## 2015-04-04 DIAGNOSIS — M1990 Unspecified osteoarthritis, unspecified site: Secondary | ICD-10-CM | POA: Diagnosis not present

## 2015-04-04 DIAGNOSIS — I2510 Atherosclerotic heart disease of native coronary artery without angina pectoris: Secondary | ICD-10-CM | POA: Diagnosis not present

## 2015-04-04 DIAGNOSIS — M810 Age-related osteoporosis without current pathological fracture: Secondary | ICD-10-CM | POA: Diagnosis not present

## 2015-04-04 DIAGNOSIS — K5790 Diverticulosis of intestine, part unspecified, without perforation or abscess without bleeding: Secondary | ICD-10-CM | POA: Diagnosis not present

## 2015-04-04 DIAGNOSIS — I252 Old myocardial infarction: Secondary | ICD-10-CM | POA: Diagnosis not present

## 2015-04-04 DIAGNOSIS — G2581 Restless legs syndrome: Secondary | ICD-10-CM | POA: Diagnosis not present

## 2015-04-04 DIAGNOSIS — J449 Chronic obstructive pulmonary disease, unspecified: Secondary | ICD-10-CM | POA: Diagnosis not present

## 2015-04-04 DIAGNOSIS — F322 Major depressive disorder, single episode, severe without psychotic features: Secondary | ICD-10-CM | POA: Diagnosis not present

## 2015-04-04 DIAGNOSIS — N183 Chronic kidney disease, stage 3 (moderate): Secondary | ICD-10-CM | POA: Diagnosis not present

## 2015-04-04 DIAGNOSIS — F419 Anxiety disorder, unspecified: Secondary | ICD-10-CM | POA: Diagnosis not present

## 2015-04-05 DIAGNOSIS — I2510 Atherosclerotic heart disease of native coronary artery without angina pectoris: Secondary | ICD-10-CM | POA: Diagnosis not present

## 2015-04-05 DIAGNOSIS — I129 Hypertensive chronic kidney disease with stage 1 through stage 4 chronic kidney disease, or unspecified chronic kidney disease: Secondary | ICD-10-CM | POA: Diagnosis not present

## 2015-04-05 DIAGNOSIS — I509 Heart failure, unspecified: Secondary | ICD-10-CM | POA: Diagnosis not present

## 2015-04-05 DIAGNOSIS — N183 Chronic kidney disease, stage 3 (moderate): Secondary | ICD-10-CM | POA: Diagnosis not present

## 2015-04-05 DIAGNOSIS — J449 Chronic obstructive pulmonary disease, unspecified: Secondary | ICD-10-CM | POA: Diagnosis not present

## 2015-04-05 DIAGNOSIS — M810 Age-related osteoporosis without current pathological fracture: Secondary | ICD-10-CM | POA: Diagnosis not present

## 2015-04-05 DIAGNOSIS — M1990 Unspecified osteoarthritis, unspecified site: Secondary | ICD-10-CM | POA: Diagnosis not present

## 2015-04-05 DIAGNOSIS — G2581 Restless legs syndrome: Secondary | ICD-10-CM | POA: Diagnosis not present

## 2015-04-05 DIAGNOSIS — F322 Major depressive disorder, single episode, severe without psychotic features: Secondary | ICD-10-CM | POA: Diagnosis not present

## 2015-04-05 DIAGNOSIS — F419 Anxiety disorder, unspecified: Secondary | ICD-10-CM | POA: Diagnosis not present

## 2015-04-05 DIAGNOSIS — I252 Old myocardial infarction: Secondary | ICD-10-CM | POA: Diagnosis not present

## 2015-04-05 DIAGNOSIS — M170 Bilateral primary osteoarthritis of knee: Secondary | ICD-10-CM | POA: Diagnosis not present

## 2015-04-05 DIAGNOSIS — K5790 Diverticulosis of intestine, part unspecified, without perforation or abscess without bleeding: Secondary | ICD-10-CM | POA: Diagnosis not present

## 2015-04-09 DIAGNOSIS — J449 Chronic obstructive pulmonary disease, unspecified: Secondary | ICD-10-CM | POA: Diagnosis not present

## 2015-04-09 DIAGNOSIS — I129 Hypertensive chronic kidney disease with stage 1 through stage 4 chronic kidney disease, or unspecified chronic kidney disease: Secondary | ICD-10-CM | POA: Diagnosis not present

## 2015-04-09 DIAGNOSIS — I2510 Atherosclerotic heart disease of native coronary artery without angina pectoris: Secondary | ICD-10-CM | POA: Diagnosis not present

## 2015-04-09 DIAGNOSIS — M170 Bilateral primary osteoarthritis of knee: Secondary | ICD-10-CM | POA: Diagnosis not present

## 2015-04-09 DIAGNOSIS — M1990 Unspecified osteoarthritis, unspecified site: Secondary | ICD-10-CM | POA: Diagnosis not present

## 2015-04-09 DIAGNOSIS — K5790 Diverticulosis of intestine, part unspecified, without perforation or abscess without bleeding: Secondary | ICD-10-CM | POA: Diagnosis not present

## 2015-04-09 DIAGNOSIS — M810 Age-related osteoporosis without current pathological fracture: Secondary | ICD-10-CM | POA: Diagnosis not present

## 2015-04-09 DIAGNOSIS — N183 Chronic kidney disease, stage 3 (moderate): Secondary | ICD-10-CM | POA: Diagnosis not present

## 2015-04-09 DIAGNOSIS — F322 Major depressive disorder, single episode, severe without psychotic features: Secondary | ICD-10-CM | POA: Diagnosis not present

## 2015-04-09 DIAGNOSIS — I252 Old myocardial infarction: Secondary | ICD-10-CM | POA: Diagnosis not present

## 2015-04-09 DIAGNOSIS — G2581 Restless legs syndrome: Secondary | ICD-10-CM | POA: Diagnosis not present

## 2015-04-09 DIAGNOSIS — F419 Anxiety disorder, unspecified: Secondary | ICD-10-CM | POA: Diagnosis not present

## 2015-04-09 DIAGNOSIS — I509 Heart failure, unspecified: Secondary | ICD-10-CM | POA: Diagnosis not present

## 2015-04-11 DIAGNOSIS — M1990 Unspecified osteoarthritis, unspecified site: Secondary | ICD-10-CM | POA: Diagnosis not present

## 2015-04-11 DIAGNOSIS — J449 Chronic obstructive pulmonary disease, unspecified: Secondary | ICD-10-CM | POA: Diagnosis not present

## 2015-04-11 DIAGNOSIS — F322 Major depressive disorder, single episode, severe without psychotic features: Secondary | ICD-10-CM | POA: Diagnosis not present

## 2015-04-11 DIAGNOSIS — I129 Hypertensive chronic kidney disease with stage 1 through stage 4 chronic kidney disease, or unspecified chronic kidney disease: Secondary | ICD-10-CM | POA: Diagnosis not present

## 2015-04-11 DIAGNOSIS — K5790 Diverticulosis of intestine, part unspecified, without perforation or abscess without bleeding: Secondary | ICD-10-CM | POA: Diagnosis not present

## 2015-04-11 DIAGNOSIS — M810 Age-related osteoporosis without current pathological fracture: Secondary | ICD-10-CM | POA: Diagnosis not present

## 2015-04-11 DIAGNOSIS — G2581 Restless legs syndrome: Secondary | ICD-10-CM | POA: Diagnosis not present

## 2015-04-11 DIAGNOSIS — I252 Old myocardial infarction: Secondary | ICD-10-CM | POA: Diagnosis not present

## 2015-04-11 DIAGNOSIS — I2510 Atherosclerotic heart disease of native coronary artery without angina pectoris: Secondary | ICD-10-CM | POA: Diagnosis not present

## 2015-04-11 DIAGNOSIS — M170 Bilateral primary osteoarthritis of knee: Secondary | ICD-10-CM | POA: Diagnosis not present

## 2015-04-11 DIAGNOSIS — F419 Anxiety disorder, unspecified: Secondary | ICD-10-CM | POA: Diagnosis not present

## 2015-04-11 DIAGNOSIS — N183 Chronic kidney disease, stage 3 (moderate): Secondary | ICD-10-CM | POA: Diagnosis not present

## 2015-04-11 DIAGNOSIS — I509 Heart failure, unspecified: Secondary | ICD-10-CM | POA: Diagnosis not present

## 2015-04-12 ENCOUNTER — Ambulatory Visit (INDEPENDENT_AMBULATORY_CARE_PROVIDER_SITE_OTHER): Payer: Medicare Other | Admitting: Internal Medicine

## 2015-04-12 ENCOUNTER — Encounter: Payer: Self-pay | Admitting: Internal Medicine

## 2015-04-12 VITALS — BP 140/80 | HR 66 | Temp 97.8°F | Ht 65.0 in | Wt 173.0 lb

## 2015-04-12 DIAGNOSIS — N183 Chronic kidney disease, stage 3 unspecified: Secondary | ICD-10-CM

## 2015-04-12 DIAGNOSIS — E279 Disorder of adrenal gland, unspecified: Secondary | ICD-10-CM | POA: Diagnosis not present

## 2015-04-12 DIAGNOSIS — Z23 Encounter for immunization: Secondary | ICD-10-CM

## 2015-04-12 DIAGNOSIS — F39 Unspecified mood [affective] disorder: Secondary | ICD-10-CM

## 2015-04-12 DIAGNOSIS — J449 Chronic obstructive pulmonary disease, unspecified: Secondary | ICD-10-CM | POA: Diagnosis not present

## 2015-04-12 DIAGNOSIS — M810 Age-related osteoporosis without current pathological fracture: Secondary | ICD-10-CM

## 2015-04-12 DIAGNOSIS — I739 Peripheral vascular disease, unspecified: Secondary | ICD-10-CM

## 2015-04-12 DIAGNOSIS — Z0000 Encounter for general adult medical examination without abnormal findings: Secondary | ICD-10-CM | POA: Diagnosis not present

## 2015-04-12 DIAGNOSIS — I6529 Occlusion and stenosis of unspecified carotid artery: Secondary | ICD-10-CM

## 2015-04-12 DIAGNOSIS — E278 Other specified disorders of adrenal gland: Secondary | ICD-10-CM

## 2015-04-12 DIAGNOSIS — I5042 Chronic combined systolic (congestive) and diastolic (congestive) heart failure: Secondary | ICD-10-CM

## 2015-04-12 LAB — RENAL FUNCTION PANEL
Albumin: 3.9 g/dL (ref 3.5–5.2)
BUN: 32 mg/dL — AB (ref 6–23)
CHLORIDE: 96 meq/L (ref 96–112)
CO2: 33 mEq/L — ABNORMAL HIGH (ref 19–32)
CREATININE: 1.57 mg/dL — AB (ref 0.40–1.20)
Calcium: 9.5 mg/dL (ref 8.4–10.5)
GFR: 33.35 mL/min — AB (ref >60.00)
Glucose, Bld: 94 mg/dL (ref 70–99)
PHOSPHORUS: 5.3 mg/dL — AB (ref 2.3–4.6)
Potassium: 4.8 mEq/L (ref 3.5–5.1)
SODIUM: 136 meq/L (ref 135–145)

## 2015-04-12 LAB — CBC WITH DIFFERENTIAL/PLATELET
BASOS PCT: 0.3 % (ref 0.0–3.0)
Basophils Absolute: 0 10*3/uL (ref 0.0–0.1)
EOS ABS: 0 10*3/uL (ref 0.0–0.7)
Eosinophils Relative: 0 % (ref 0.0–5.0)
HCT: 34.7 % — ABNORMAL LOW (ref 36.0–46.0)
Hemoglobin: 11.5 g/dL — ABNORMAL LOW (ref 12.0–15.0)
Lymphocytes Relative: 12.4 % (ref 12.0–46.0)
Lymphs Abs: 0.9 10*3/uL (ref 0.7–4.0)
MCHC: 33.2 g/dL (ref 30.0–36.0)
MCV: 95.2 fl (ref 78.0–100.0)
MONO ABS: 0.8 10*3/uL (ref 0.1–1.0)
Monocytes Relative: 11.3 % (ref 3.0–12.0)
Neutro Abs: 5.4 10*3/uL (ref 1.4–7.7)
Neutrophils Relative %: 76 % (ref 43.0–77.0)
Platelets: 186 10*3/uL (ref 150.0–400.0)
RBC: 3.64 Mil/uL — ABNORMAL LOW (ref 3.87–5.11)
RDW: 14 % (ref 11.5–15.5)
WBC: 7.2 10*3/uL (ref 4.0–10.5)

## 2015-04-12 MED ORDER — DENOSUMAB 60 MG/ML ~~LOC~~ SOLN
60.0000 mg | Freq: Once | SUBCUTANEOUS | Status: AC
  Administered 2015-04-12: 60 mg via SUBCUTANEOUS

## 2015-04-12 NOTE — Assessment & Plan Note (Signed)
Mood has been good Continue the escitalopram

## 2015-04-12 NOTE — Addendum Note (Signed)
Addended by: Despina Hidden on: 04/12/2015 11:46 AM   Modules accepted: Orders

## 2015-04-12 NOTE — Assessment & Plan Note (Signed)
No claudication.  ?

## 2015-04-12 NOTE — Progress Notes (Signed)
Pre visit review using our clinic review tool, if applicable. No additional management support is needed unless otherwise documented below in the visit note. 

## 2015-04-12 NOTE — Assessment & Plan Note (Signed)
Due for prolia today

## 2015-04-12 NOTE — Assessment & Plan Note (Signed)
I have personally reviewed the Medicare Annual Wellness questionnaire and have noted 1. The patient's medical and social history 2. Their use of alcohol, tobacco or illicit drugs 3. Their current medications and supplements 4. The patient's functional ability including ADL's, fall risks, home safety risks and hearing or visual             impairment. 5. Diet and physical activities 6. Evidence for depression or mood disorders  The patients weight, height, BMI and visual acuity have been recorded in the chart I have made referrals, counseling and provided education to the patient based review of the above and I have provided the pt with a written personalized care plan for preventive services.  I have provided you with a copy of your personalized plan for preventive services. Please take the time to review along with your updated medication list.  Flu vaccine today No cancer screening due to age and medical condition

## 2015-04-12 NOTE — Assessment & Plan Note (Signed)
Compensated  Checks weights daily No changes needed

## 2015-04-12 NOTE — Assessment & Plan Note (Signed)
On losartan  Will recheck labs 

## 2015-04-12 NOTE — Progress Notes (Signed)
Subjective:    Patient ID: Haley Harris, female    DOB: Oct 02, 1928, 81 y.o.   MRN: [id number]  HPI Here for Medicare wellness and follow up of multiple chronic medical conditions With daughter Reviewed advanced directives Reviewed her recent hospital stay--just the one this year Other doctors-- Dr Terrence Dupont (cardio), Louanne Skye (ortho), Medoff (GI), Dingledein (ophtho) Occasional glass of wine No tobacco now [place] once this past summer--bruised her knee. Now uses rollator instead of cane Reviewed medications Still working with therapists--- tries to do her own home program as well No sexual relationship Mild vision problems--just uses reading glasses Hearing is not great--has hearing aides (sees audiologist also) Needs help with shower--but not dressing or using bathroom. No instrumental ADLs--daughter handles it all. Will rarely try to cook some No apparent cognitive problems (daughter concurs)  Bowels are back to normal Takes 1-2 stool softeners daily most days Appetite is good Is regaining some of her lost weight  Is avoiding all salt Checks weight daily--fairly stable Will still have AM SOB with the humidity--- better with albuterol Some cough--not too bad No chest pain Chronic edema--better now with knee high TEDs  Gets dizzy when moving quickly-- relates to low BP and her knees  Chronic leg pain--mostly her knees Constant pain--worse with stairs Will loosen up over time Doesn't seem to have claudication Tries to hold off on percocet---limit 2 per day  Mood has been okay If she feels herself getting down--she does something to improve her mood (like watch a good old movie on TCM) No persistent depression  Reviewed adrenal mass She doesn't really want to follow up with this  Current Outpatient Prescriptions on File Prior to Visit  Medication Sig Dispense Refill  . acetaminophen (TYLENOL) 325 MG tablet Take 2 tablets (650 mg total) by mouth every 4 (four) hours as  needed for headache or mild pain.    Marland Kitchen albuterol (PROVENTIL) (2.5 MG/3ML) 0.083% nebulizer solution Take 3 mLs (2.5 mg total) by nebulization every 6 (six) hours as needed for wheezing or shortness of breath. 540 mL 3  . aspirin 81 MG tablet Take 81 mg by mouth daily after lunch.     . Calcium Carbonate-Vitamin D (CALTRATE 600+D PO) Take 1 tablet by mouth daily after lunch.     . carvedilol (COREG) 12.5 MG tablet Take 1 tablet (12.5 mg total) by mouth 2 (two) times daily with a meal. 180 tablet 3  . cetirizine (ZYRTEC) 10 MG tablet Take 10 mg by mouth daily. For allergies    . denosumab (PROLIA) 60 MG/ML SOLN injection Inject 60 mg into the skin every 6 (six) months. Administer in upper arm, thigh, or abdomen    . escitalopram (LEXAPRO) 10 MG tablet Take 1 tablet (10 mg total) by mouth daily. 90 tablet 3  . famotidine (PEPCID) 20 MG tablet Take 1 tablet (20 mg total) by mouth 2 (two) times daily. 180 tablet 3  . fluticasone (FLONASE) 50 MCG/ACT nasal spray Place 2 sprays into both nostrils daily. 48 g 3  . furosemide (LASIX) 40 MG tablet Take 1 tablet (40 mg total) by mouth daily. 90 tablet 3  . gabapentin (NEURONTIN) 100 MG capsule Take 1 capsule (100 mg total) by mouth 2 (two) times daily. 180 capsule 3  . losartan (COZAAR) 50 MG tablet TAKE 1 TABLET BY MOUTH DAILY 90 tablet 3  . montelukast (SINGULAIR) 10 MG tablet TAKE 1 TABLET BY MOUTH DAILY 90 tablet 3  . Multiple Vitamin (MULITIVITAMIN WITH MINERALS)  TABS Take 1 tablet by mouth daily after lunch.     . oxyCODONE-acetaminophen (PERCOCET/ROXICET) 5-325 MG per tablet Take 1-2 tablets by mouth every 4 (four) hours as needed. 30 tablet 0  . pravastatin (PRAVACHOL) 80 MG tablet Take 1 tablet by mouth  daily 90 tablet 3  . spironolactone (ALDACTONE) 25 MG tablet Take 1 tablet (25 mg total) by mouth daily. 90 tablet 3  . vitamin B-12 (CYANOCOBALAMIN) 500 MCG tablet Take 1,000 mcg by mouth 3 (three) times a week. Mon, Wed, Fri     No current  facility-administered medications on file prior to visit.    Allergies  Allergen Reactions  . Amoxicillin-Pot Clavulanate Anaphylaxis  . Cephalexin Other (See Comments)    Reaction unknown  . Clarithromycin Other (See Comments)    Reaction unknown  . Lidocaine   . Nifedipine Other (See Comments)    Reaction unknown  . Nsaids Other (See Comments)    Heart issue  . Olmesartan Medoxomil     REACTION: cough;  But tolerating Losartan 02/2014  . Penicillins   . Tramadol Hcl Other (See Comments)    Reaction unknown    Past Medical History  Diagnosis Date  . Allergic rhinitis, cause unspecified   . Unspecified cardiovascular disease   . Personal history of malignant neoplasm of breast   . Occlusion and stenosis of carotid artery without mention of cerebral infarction   . Chronic airway obstruction, not elsewhere classified   . Depressive disorder, not elsewhere classified   . Other dyspnea and respiratory abnormality   . Esophageal reflux   . Unspecified hearing loss   . Other and unspecified hyperlipidemia   . Unspecified essential hypertension   . Osteoporosis, unspecified   . Peripheral vascular disease, unspecified   . Unspecified urinary incontinence   . Spinal stenosis   . ACE-inhibitor cough   . Angina   . Heart murmur     aS CHILD  . Shortness of breath   . Recurrent upper respiratory infection (URI)   . Anxiety   . Pneumonia   . 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 Myocardial infarct   . Osteoarthrosis, unspecified whether generalized or localized, unspecified site   . Malignant neoplasm of breast (female), unspecified site   . Compression fracture of T12 vertebra 10/28/2011    Past Surgical History  Procedure Laterality Date  . Mastectomy  1987    Right  . Breast reconstruction  1998    Reconstruction   . Reduction mammaplasty  1998    Left  . Mastoidectomy  childhood  . Appendectomy  1948  . Lumbar laminectomy  2003  . Shoulder  surgery  02/2005    Bilateral fractures with multiple surgeries  . Breast implant removal  06/12/09    right  . Abdominal hysterectomy    . Fracture surgery      fracture right elbow  . Coronary angioplasty  11/12    distal RCA  . Mastectomy    . Left heart catheterization with coronary angiogram N/A 06/05/2011    Procedure: LEFT HEART CATHETERIZATION WITH CORONARY ANGIOGRAM;  Surgeon: Clent Demark, MD;  Location: [facility] CATH LAB;  Service: Cardiovascular;  Laterality: N/A;  . Left heart catheterization with coronary angiogram N/A 03/05/2014    Procedure: LEFT HEART CATHETERIZATION WITH CORONARY ANGIOGRAM;  Surgeon: Clent Demark, MD;  Location: [facility] CATH LAB;  Service: Cardiovascular;  Laterality: N/A;  . Flexible sigmoidoscopy N/A 01/21/2015    Procedure:  FLEXIBLE SIGMOIDOSCOPY;  Surgeon: Richmond Campbell, MD;  Location: [facility] ENDOSCOPY;  Service: Endoscopy;  Laterality: N/A;  . Flexible sigmoidoscopy  01/22/2015       . Cardiac catheterization N/A 01/25/2015    Procedure: Left Heart Cath and Coronary Angiography;  Surgeon: Charolette Forward, MD;  Location: [place] CV LAB;  Service: Cardiovascular;  Laterality: N/A;    Family History  Problem Relation Age of Onset  . Heart attack Father 48  . Cancer Mother     ovarian  . Cancer Brother     lung  . Arthritis      family    Social History   Social History  . Marital Status: Widowed    Spouse Name: N/A  . Number of Children: 2  . Years of Education: N/A   Occupational History  . 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     Social History Main Topics  . Smoking status: Former Smoker    Quit date: 07/13/1974  . Smokeless tobacco: Never Used  . Alcohol Use: 0.0 oz/week    0 Standard drinks or equivalent per week     Comment: occasional  . Drug Use: No  . Sexual Activity: No   Other Topics Concern  . Not on file   Social History Narrative   Has living will   Son or daughter would be health care POA.   Requests  DNR--written 03/02/13   No tube feeds if cognitively unaware   Review of Systems Appetite is okay Sleeps fair--dreams a lot. Some tiredness in day--falls asleep easy  Wears seat belt Teeth okay--hasn't been to dentist Bowels are okay Voids okay Has stopped breast cancer follow up ---about 5 years ago    Objective:   Physical Exam  Constitutional: She is oriented to person, place, and time. She appears well-developed and well-nourished. No distress.  HENT:  Mouth/Throat: Oropharynx is clear and moist. No oropharyngeal exudate.  Neck: Normal range of motion. Neck supple. No thyromegaly present.  Cardiovascular: Normal rate, regular rhythm, normal heart sounds and intact distal pulses.  Exam reveals no gallop.   No murmur heard. Pulmonary/Chest: Effort normal and breath sounds normal. No respiratory distress. She has no wheezes. She has no rales.  Abdominal: Soft. There is no tenderness.  Musculoskeletal: She exhibits no edema.  Lymphadenopathy:    She has no cervical adenopathy.  Neurological: She is alert and oriented to person, place, and time.  President-- "Ophelia Shoulder, [place]" [phone] [id number] Recall 3/3  Skin: No rash noted.  Psychiatric: She has a normal mood and affect. Her behavior is normal.          Assessment & Plan:

## 2015-04-12 NOTE — Assessment & Plan Note (Signed)
Needs albuterol at times in AM--not persistently though

## 2015-04-12 NOTE — Assessment & Plan Note (Signed)
No neurologic symptoms

## 2015-04-12 NOTE — Assessment & Plan Note (Signed)
Probably benign adenoma Doesn't want to look into this any more

## 2015-04-15 ENCOUNTER — Telehealth: Payer: Self-pay

## 2015-04-15 DIAGNOSIS — F322 Major depressive disorder, single episode, severe without psychotic features: Secondary | ICD-10-CM | POA: Diagnosis not present

## 2015-04-15 DIAGNOSIS — I509 Heart failure, unspecified: Secondary | ICD-10-CM | POA: Diagnosis not present

## 2015-04-15 DIAGNOSIS — M170 Bilateral primary osteoarthritis of knee: Secondary | ICD-10-CM | POA: Diagnosis not present

## 2015-04-15 DIAGNOSIS — N183 Chronic kidney disease, stage 3 (moderate): Secondary | ICD-10-CM | POA: Diagnosis not present

## 2015-04-15 DIAGNOSIS — G2581 Restless legs syndrome: Secondary | ICD-10-CM | POA: Diagnosis not present

## 2015-04-15 DIAGNOSIS — I252 Old myocardial infarction: Secondary | ICD-10-CM | POA: Diagnosis not present

## 2015-04-15 DIAGNOSIS — K5790 Diverticulosis of intestine, part unspecified, without perforation or abscess without bleeding: Secondary | ICD-10-CM | POA: Diagnosis not present

## 2015-04-15 DIAGNOSIS — M810 Age-related osteoporosis without current pathological fracture: Secondary | ICD-10-CM | POA: Diagnosis not present

## 2015-04-15 DIAGNOSIS — F419 Anxiety disorder, unspecified: Secondary | ICD-10-CM | POA: Diagnosis not present

## 2015-04-15 DIAGNOSIS — G629 Polyneuropathy, unspecified: Secondary | ICD-10-CM | POA: Diagnosis not present

## 2015-04-15 DIAGNOSIS — I2510 Atherosclerotic heart disease of native coronary artery without angina pectoris: Secondary | ICD-10-CM | POA: Diagnosis not present

## 2015-04-15 DIAGNOSIS — J449 Chronic obstructive pulmonary disease, unspecified: Secondary | ICD-10-CM | POA: Diagnosis not present

## 2015-04-15 DIAGNOSIS — I130 Hypertensive heart and chronic kidney disease with heart failure and stage 1 through stage 4 chronic kidney disease, or unspecified chronic kidney disease: Secondary | ICD-10-CM | POA: Diagnosis not present

## 2015-04-15 NOTE — Telephone Encounter (Signed)
Okay to extend

## 2015-04-15 NOTE — Telephone Encounter (Signed)
Marlowe Kays OT with [facility] left v/m requesting verbal order to extend home health OT orders 2 x a week for 5 weeks.

## 2015-04-15 NOTE — Telephone Encounter (Signed)
Spoke with nurse and advised results  

## 2015-04-16 ENCOUNTER — Encounter: Payer: Self-pay | Admitting: *Deleted

## 2015-04-16 DIAGNOSIS — N183 Chronic kidney disease, stage 3 (moderate): Secondary | ICD-10-CM | POA: Diagnosis not present

## 2015-04-16 DIAGNOSIS — I509 Heart failure, unspecified: Secondary | ICD-10-CM | POA: Diagnosis not present

## 2015-04-16 DIAGNOSIS — F419 Anxiety disorder, unspecified: Secondary | ICD-10-CM | POA: Diagnosis not present

## 2015-04-16 DIAGNOSIS — I2510 Atherosclerotic heart disease of native coronary artery without angina pectoris: Secondary | ICD-10-CM | POA: Diagnosis not present

## 2015-04-16 DIAGNOSIS — I130 Hypertensive heart and chronic kidney disease with heart failure and stage 1 through stage 4 chronic kidney disease, or unspecified chronic kidney disease: Secondary | ICD-10-CM | POA: Diagnosis not present

## 2015-04-16 DIAGNOSIS — K5790 Diverticulosis of intestine, part unspecified, without perforation or abscess without bleeding: Secondary | ICD-10-CM | POA: Diagnosis not present

## 2015-04-16 DIAGNOSIS — I252 Old myocardial infarction: Secondary | ICD-10-CM | POA: Diagnosis not present

## 2015-04-16 DIAGNOSIS — F322 Major depressive disorder, single episode, severe without psychotic features: Secondary | ICD-10-CM | POA: Diagnosis not present

## 2015-04-16 DIAGNOSIS — J449 Chronic obstructive pulmonary disease, unspecified: Secondary | ICD-10-CM | POA: Diagnosis not present

## 2015-04-16 DIAGNOSIS — G629 Polyneuropathy, unspecified: Secondary | ICD-10-CM | POA: Diagnosis not present

## 2015-04-16 DIAGNOSIS — M170 Bilateral primary osteoarthritis of knee: Secondary | ICD-10-CM | POA: Diagnosis not present

## 2015-04-16 DIAGNOSIS — G2581 Restless legs syndrome: Secondary | ICD-10-CM | POA: Diagnosis not present

## 2015-04-16 DIAGNOSIS — M810 Age-related osteoporosis without current pathological fracture: Secondary | ICD-10-CM | POA: Diagnosis not present

## 2015-04-17 DIAGNOSIS — N183 Chronic kidney disease, stage 3 (moderate): Secondary | ICD-10-CM | POA: Diagnosis not present

## 2015-04-17 DIAGNOSIS — G2581 Restless legs syndrome: Secondary | ICD-10-CM | POA: Diagnosis not present

## 2015-04-17 DIAGNOSIS — K5790 Diverticulosis of intestine, part unspecified, without perforation or abscess without bleeding: Secondary | ICD-10-CM | POA: Diagnosis not present

## 2015-04-17 DIAGNOSIS — F322 Major depressive disorder, single episode, severe without psychotic features: Secondary | ICD-10-CM | POA: Diagnosis not present

## 2015-04-17 DIAGNOSIS — I2510 Atherosclerotic heart disease of native coronary artery without angina pectoris: Secondary | ICD-10-CM | POA: Diagnosis not present

## 2015-04-17 DIAGNOSIS — I252 Old myocardial infarction: Secondary | ICD-10-CM | POA: Diagnosis not present

## 2015-04-17 DIAGNOSIS — J449 Chronic obstructive pulmonary disease, unspecified: Secondary | ICD-10-CM | POA: Diagnosis not present

## 2015-04-17 DIAGNOSIS — I130 Hypertensive heart and chronic kidney disease with heart failure and stage 1 through stage 4 chronic kidney disease, or unspecified chronic kidney disease: Secondary | ICD-10-CM | POA: Diagnosis not present

## 2015-04-17 DIAGNOSIS — M810 Age-related osteoporosis without current pathological fracture: Secondary | ICD-10-CM | POA: Diagnosis not present

## 2015-04-17 DIAGNOSIS — M170 Bilateral primary osteoarthritis of knee: Secondary | ICD-10-CM | POA: Diagnosis not present

## 2015-04-17 DIAGNOSIS — I509 Heart failure, unspecified: Secondary | ICD-10-CM | POA: Diagnosis not present

## 2015-04-17 DIAGNOSIS — F419 Anxiety disorder, unspecified: Secondary | ICD-10-CM | POA: Diagnosis not present

## 2015-04-17 DIAGNOSIS — G629 Polyneuropathy, unspecified: Secondary | ICD-10-CM | POA: Diagnosis not present

## 2015-04-18 DIAGNOSIS — G629 Polyneuropathy, unspecified: Secondary | ICD-10-CM | POA: Diagnosis not present

## 2015-04-18 DIAGNOSIS — K5790 Diverticulosis of intestine, part unspecified, without perforation or abscess without bleeding: Secondary | ICD-10-CM | POA: Diagnosis not present

## 2015-04-18 DIAGNOSIS — G2581 Restless legs syndrome: Secondary | ICD-10-CM | POA: Diagnosis not present

## 2015-04-18 DIAGNOSIS — I2510 Atherosclerotic heart disease of native coronary artery without angina pectoris: Secondary | ICD-10-CM | POA: Diagnosis not present

## 2015-04-18 DIAGNOSIS — I252 Old myocardial infarction: Secondary | ICD-10-CM | POA: Diagnosis not present

## 2015-04-18 DIAGNOSIS — F322 Major depressive disorder, single episode, severe without psychotic features: Secondary | ICD-10-CM | POA: Diagnosis not present

## 2015-04-18 DIAGNOSIS — J449 Chronic obstructive pulmonary disease, unspecified: Secondary | ICD-10-CM | POA: Diagnosis not present

## 2015-04-18 DIAGNOSIS — M810 Age-related osteoporosis without current pathological fracture: Secondary | ICD-10-CM | POA: Diagnosis not present

## 2015-04-18 DIAGNOSIS — I509 Heart failure, unspecified: Secondary | ICD-10-CM | POA: Diagnosis not present

## 2015-04-18 DIAGNOSIS — M170 Bilateral primary osteoarthritis of knee: Secondary | ICD-10-CM | POA: Diagnosis not present

## 2015-04-18 DIAGNOSIS — N183 Chronic kidney disease, stage 3 (moderate): Secondary | ICD-10-CM | POA: Diagnosis not present

## 2015-04-18 DIAGNOSIS — I130 Hypertensive heart and chronic kidney disease with heart failure and stage 1 through stage 4 chronic kidney disease, or unspecified chronic kidney disease: Secondary | ICD-10-CM | POA: Diagnosis not present

## 2015-04-18 DIAGNOSIS — F419 Anxiety disorder, unspecified: Secondary | ICD-10-CM | POA: Diagnosis not present

## 2015-04-19 DIAGNOSIS — K5790 Diverticulosis of intestine, part unspecified, without perforation or abscess without bleeding: Secondary | ICD-10-CM | POA: Diagnosis not present

## 2015-04-19 DIAGNOSIS — G2581 Restless legs syndrome: Secondary | ICD-10-CM | POA: Diagnosis not present

## 2015-04-19 DIAGNOSIS — I509 Heart failure, unspecified: Secondary | ICD-10-CM | POA: Diagnosis not present

## 2015-04-19 DIAGNOSIS — I252 Old myocardial infarction: Secondary | ICD-10-CM | POA: Diagnosis not present

## 2015-04-19 DIAGNOSIS — J449 Chronic obstructive pulmonary disease, unspecified: Secondary | ICD-10-CM | POA: Diagnosis not present

## 2015-04-19 DIAGNOSIS — F322 Major depressive disorder, single episode, severe without psychotic features: Secondary | ICD-10-CM | POA: Diagnosis not present

## 2015-04-19 DIAGNOSIS — G629 Polyneuropathy, unspecified: Secondary | ICD-10-CM | POA: Diagnosis not present

## 2015-04-19 DIAGNOSIS — N183 Chronic kidney disease, stage 3 (moderate): Secondary | ICD-10-CM | POA: Diagnosis not present

## 2015-04-19 DIAGNOSIS — M810 Age-related osteoporosis without current pathological fracture: Secondary | ICD-10-CM | POA: Diagnosis not present

## 2015-04-19 DIAGNOSIS — M170 Bilateral primary osteoarthritis of knee: Secondary | ICD-10-CM | POA: Diagnosis not present

## 2015-04-19 DIAGNOSIS — I2510 Atherosclerotic heart disease of native coronary artery without angina pectoris: Secondary | ICD-10-CM | POA: Diagnosis not present

## 2015-04-19 DIAGNOSIS — I130 Hypertensive heart and chronic kidney disease with heart failure and stage 1 through stage 4 chronic kidney disease, or unspecified chronic kidney disease: Secondary | ICD-10-CM | POA: Diagnosis not present

## 2015-04-19 DIAGNOSIS — F419 Anxiety disorder, unspecified: Secondary | ICD-10-CM | POA: Diagnosis not present

## 2015-04-22 DIAGNOSIS — M170 Bilateral primary osteoarthritis of knee: Secondary | ICD-10-CM | POA: Diagnosis not present

## 2015-04-22 DIAGNOSIS — I2510 Atherosclerotic heart disease of native coronary artery without angina pectoris: Secondary | ICD-10-CM | POA: Diagnosis not present

## 2015-04-22 DIAGNOSIS — M810 Age-related osteoporosis without current pathological fracture: Secondary | ICD-10-CM | POA: Diagnosis not present

## 2015-04-22 DIAGNOSIS — J449 Chronic obstructive pulmonary disease, unspecified: Secondary | ICD-10-CM | POA: Diagnosis not present

## 2015-04-22 DIAGNOSIS — K5790 Diverticulosis of intestine, part unspecified, without perforation or abscess without bleeding: Secondary | ICD-10-CM | POA: Diagnosis not present

## 2015-04-22 DIAGNOSIS — G2581 Restless legs syndrome: Secondary | ICD-10-CM | POA: Diagnosis not present

## 2015-04-22 DIAGNOSIS — I252 Old myocardial infarction: Secondary | ICD-10-CM | POA: Diagnosis not present

## 2015-04-22 DIAGNOSIS — G629 Polyneuropathy, unspecified: Secondary | ICD-10-CM | POA: Diagnosis not present

## 2015-04-22 DIAGNOSIS — F419 Anxiety disorder, unspecified: Secondary | ICD-10-CM | POA: Diagnosis not present

## 2015-04-22 DIAGNOSIS — I130 Hypertensive heart and chronic kidney disease with heart failure and stage 1 through stage 4 chronic kidney disease, or unspecified chronic kidney disease: Secondary | ICD-10-CM | POA: Diagnosis not present

## 2015-04-22 DIAGNOSIS — F322 Major depressive disorder, single episode, severe without psychotic features: Secondary | ICD-10-CM | POA: Diagnosis not present

## 2015-04-22 DIAGNOSIS — I509 Heart failure, unspecified: Secondary | ICD-10-CM | POA: Diagnosis not present

## 2015-04-22 DIAGNOSIS — N183 Chronic kidney disease, stage 3 (moderate): Secondary | ICD-10-CM | POA: Diagnosis not present

## 2015-04-23 DIAGNOSIS — F322 Major depressive disorder, single episode, severe without psychotic features: Secondary | ICD-10-CM | POA: Diagnosis not present

## 2015-04-23 DIAGNOSIS — I130 Hypertensive heart and chronic kidney disease with heart failure and stage 1 through stage 4 chronic kidney disease, or unspecified chronic kidney disease: Secondary | ICD-10-CM | POA: Diagnosis not present

## 2015-04-23 DIAGNOSIS — M170 Bilateral primary osteoarthritis of knee: Secondary | ICD-10-CM | POA: Diagnosis not present

## 2015-04-23 DIAGNOSIS — J449 Chronic obstructive pulmonary disease, unspecified: Secondary | ICD-10-CM | POA: Diagnosis not present

## 2015-04-23 DIAGNOSIS — F419 Anxiety disorder, unspecified: Secondary | ICD-10-CM | POA: Diagnosis not present

## 2015-04-23 DIAGNOSIS — G629 Polyneuropathy, unspecified: Secondary | ICD-10-CM | POA: Diagnosis not present

## 2015-04-23 DIAGNOSIS — G2581 Restless legs syndrome: Secondary | ICD-10-CM | POA: Diagnosis not present

## 2015-04-23 DIAGNOSIS — M810 Age-related osteoporosis without current pathological fracture: Secondary | ICD-10-CM | POA: Diagnosis not present

## 2015-04-23 DIAGNOSIS — I509 Heart failure, unspecified: Secondary | ICD-10-CM | POA: Diagnosis not present

## 2015-04-23 DIAGNOSIS — I252 Old myocardial infarction: Secondary | ICD-10-CM | POA: Diagnosis not present

## 2015-04-23 DIAGNOSIS — N183 Chronic kidney disease, stage 3 (moderate): Secondary | ICD-10-CM | POA: Diagnosis not present

## 2015-04-23 DIAGNOSIS — I2510 Atherosclerotic heart disease of native coronary artery without angina pectoris: Secondary | ICD-10-CM | POA: Diagnosis not present

## 2015-04-23 DIAGNOSIS — K5790 Diverticulosis of intestine, part unspecified, without perforation or abscess without bleeding: Secondary | ICD-10-CM | POA: Diagnosis not present

## 2015-04-24 DIAGNOSIS — K5790 Diverticulosis of intestine, part unspecified, without perforation or abscess without bleeding: Secondary | ICD-10-CM | POA: Diagnosis not present

## 2015-04-24 DIAGNOSIS — G2581 Restless legs syndrome: Secondary | ICD-10-CM | POA: Diagnosis not present

## 2015-04-24 DIAGNOSIS — I130 Hypertensive heart and chronic kidney disease with heart failure and stage 1 through stage 4 chronic kidney disease, or unspecified chronic kidney disease: Secondary | ICD-10-CM | POA: Diagnosis not present

## 2015-04-24 DIAGNOSIS — I509 Heart failure, unspecified: Secondary | ICD-10-CM | POA: Diagnosis not present

## 2015-04-24 DIAGNOSIS — M810 Age-related osteoporosis without current pathological fracture: Secondary | ICD-10-CM | POA: Diagnosis not present

## 2015-04-24 DIAGNOSIS — J449 Chronic obstructive pulmonary disease, unspecified: Secondary | ICD-10-CM | POA: Diagnosis not present

## 2015-04-24 DIAGNOSIS — N183 Chronic kidney disease, stage 3 (moderate): Secondary | ICD-10-CM | POA: Diagnosis not present

## 2015-04-24 DIAGNOSIS — I252 Old myocardial infarction: Secondary | ICD-10-CM | POA: Diagnosis not present

## 2015-04-24 DIAGNOSIS — I2510 Atherosclerotic heart disease of native coronary artery without angina pectoris: Secondary | ICD-10-CM | POA: Diagnosis not present

## 2015-04-24 DIAGNOSIS — G629 Polyneuropathy, unspecified: Secondary | ICD-10-CM | POA: Diagnosis not present

## 2015-04-24 DIAGNOSIS — M170 Bilateral primary osteoarthritis of knee: Secondary | ICD-10-CM

## 2015-04-24 DIAGNOSIS — F322 Major depressive disorder, single episode, severe without psychotic features: Secondary | ICD-10-CM | POA: Diagnosis not present

## 2015-04-24 DIAGNOSIS — F419 Anxiety disorder, unspecified: Secondary | ICD-10-CM | POA: Diagnosis not present

## 2015-04-25 DIAGNOSIS — N183 Chronic kidney disease, stage 3 (moderate): Secondary | ICD-10-CM | POA: Diagnosis not present

## 2015-04-25 DIAGNOSIS — I509 Heart failure, unspecified: Secondary | ICD-10-CM | POA: Diagnosis not present

## 2015-04-25 DIAGNOSIS — I252 Old myocardial infarction: Secondary | ICD-10-CM | POA: Diagnosis not present

## 2015-04-25 DIAGNOSIS — I2510 Atherosclerotic heart disease of native coronary artery without angina pectoris: Secondary | ICD-10-CM | POA: Diagnosis not present

## 2015-04-25 DIAGNOSIS — I130 Hypertensive heart and chronic kidney disease with heart failure and stage 1 through stage 4 chronic kidney disease, or unspecified chronic kidney disease: Secondary | ICD-10-CM | POA: Diagnosis not present

## 2015-04-25 DIAGNOSIS — F419 Anxiety disorder, unspecified: Secondary | ICD-10-CM | POA: Diagnosis not present

## 2015-04-25 DIAGNOSIS — M170 Bilateral primary osteoarthritis of knee: Secondary | ICD-10-CM | POA: Diagnosis not present

## 2015-04-25 DIAGNOSIS — K5790 Diverticulosis of intestine, part unspecified, without perforation or abscess without bleeding: Secondary | ICD-10-CM | POA: Diagnosis not present

## 2015-04-25 DIAGNOSIS — F322 Major depressive disorder, single episode, severe without psychotic features: Secondary | ICD-10-CM | POA: Diagnosis not present

## 2015-04-25 DIAGNOSIS — G2581 Restless legs syndrome: Secondary | ICD-10-CM | POA: Diagnosis not present

## 2015-04-25 DIAGNOSIS — M810 Age-related osteoporosis without current pathological fracture: Secondary | ICD-10-CM | POA: Diagnosis not present

## 2015-04-25 DIAGNOSIS — J449 Chronic obstructive pulmonary disease, unspecified: Secondary | ICD-10-CM | POA: Diagnosis not present

## 2015-04-25 DIAGNOSIS — G629 Polyneuropathy, unspecified: Secondary | ICD-10-CM | POA: Diagnosis not present

## 2015-04-26 DIAGNOSIS — G2581 Restless legs syndrome: Secondary | ICD-10-CM | POA: Diagnosis not present

## 2015-04-26 DIAGNOSIS — M810 Age-related osteoporosis without current pathological fracture: Secondary | ICD-10-CM | POA: Diagnosis not present

## 2015-04-26 DIAGNOSIS — N183 Chronic kidney disease, stage 3 (moderate): Secondary | ICD-10-CM | POA: Diagnosis not present

## 2015-04-26 DIAGNOSIS — M170 Bilateral primary osteoarthritis of knee: Secondary | ICD-10-CM | POA: Diagnosis not present

## 2015-04-26 DIAGNOSIS — G629 Polyneuropathy, unspecified: Secondary | ICD-10-CM | POA: Diagnosis not present

## 2015-04-26 DIAGNOSIS — I130 Hypertensive heart and chronic kidney disease with heart failure and stage 1 through stage 4 chronic kidney disease, or unspecified chronic kidney disease: Secondary | ICD-10-CM | POA: Diagnosis not present

## 2015-04-26 DIAGNOSIS — I252 Old myocardial infarction: Secondary | ICD-10-CM | POA: Diagnosis not present

## 2015-04-26 DIAGNOSIS — I509 Heart failure, unspecified: Secondary | ICD-10-CM | POA: Diagnosis not present

## 2015-04-26 DIAGNOSIS — K5790 Diverticulosis of intestine, part unspecified, without perforation or abscess without bleeding: Secondary | ICD-10-CM | POA: Diagnosis not present

## 2015-04-26 DIAGNOSIS — F322 Major depressive disorder, single episode, severe without psychotic features: Secondary | ICD-10-CM | POA: Diagnosis not present

## 2015-04-26 DIAGNOSIS — F419 Anxiety disorder, unspecified: Secondary | ICD-10-CM | POA: Diagnosis not present

## 2015-04-26 DIAGNOSIS — J449 Chronic obstructive pulmonary disease, unspecified: Secondary | ICD-10-CM | POA: Diagnosis not present

## 2015-04-26 DIAGNOSIS — I2510 Atherosclerotic heart disease of native coronary artery without angina pectoris: Secondary | ICD-10-CM | POA: Diagnosis not present

## 2015-04-29 DIAGNOSIS — I2510 Atherosclerotic heart disease of native coronary artery without angina pectoris: Secondary | ICD-10-CM | POA: Diagnosis not present

## 2015-04-29 DIAGNOSIS — F322 Major depressive disorder, single episode, severe without psychotic features: Secondary | ICD-10-CM | POA: Diagnosis not present

## 2015-04-29 DIAGNOSIS — N183 Chronic kidney disease, stage 3 (moderate): Secondary | ICD-10-CM | POA: Diagnosis not present

## 2015-04-29 DIAGNOSIS — I130 Hypertensive heart and chronic kidney disease with heart failure and stage 1 through stage 4 chronic kidney disease, or unspecified chronic kidney disease: Secondary | ICD-10-CM | POA: Diagnosis not present

## 2015-04-29 DIAGNOSIS — K5790 Diverticulosis of intestine, part unspecified, without perforation or abscess without bleeding: Secondary | ICD-10-CM | POA: Diagnosis not present

## 2015-04-29 DIAGNOSIS — J449 Chronic obstructive pulmonary disease, unspecified: Secondary | ICD-10-CM | POA: Diagnosis not present

## 2015-04-29 DIAGNOSIS — G2581 Restless legs syndrome: Secondary | ICD-10-CM | POA: Diagnosis not present

## 2015-04-29 DIAGNOSIS — G629 Polyneuropathy, unspecified: Secondary | ICD-10-CM | POA: Diagnosis not present

## 2015-04-29 DIAGNOSIS — I509 Heart failure, unspecified: Secondary | ICD-10-CM | POA: Diagnosis not present

## 2015-04-29 DIAGNOSIS — M170 Bilateral primary osteoarthritis of knee: Secondary | ICD-10-CM | POA: Diagnosis not present

## 2015-04-29 DIAGNOSIS — F419 Anxiety disorder, unspecified: Secondary | ICD-10-CM | POA: Diagnosis not present

## 2015-04-29 DIAGNOSIS — I252 Old myocardial infarction: Secondary | ICD-10-CM | POA: Diagnosis not present

## 2015-04-29 DIAGNOSIS — M810 Age-related osteoporosis without current pathological fracture: Secondary | ICD-10-CM | POA: Diagnosis not present

## 2015-04-30 DIAGNOSIS — M170 Bilateral primary osteoarthritis of knee: Secondary | ICD-10-CM | POA: Diagnosis not present

## 2015-04-30 DIAGNOSIS — I130 Hypertensive heart and chronic kidney disease with heart failure and stage 1 through stage 4 chronic kidney disease, or unspecified chronic kidney disease: Secondary | ICD-10-CM | POA: Diagnosis not present

## 2015-04-30 DIAGNOSIS — F419 Anxiety disorder, unspecified: Secondary | ICD-10-CM | POA: Diagnosis not present

## 2015-04-30 DIAGNOSIS — J449 Chronic obstructive pulmonary disease, unspecified: Secondary | ICD-10-CM | POA: Diagnosis not present

## 2015-04-30 DIAGNOSIS — I2510 Atherosclerotic heart disease of native coronary artery without angina pectoris: Secondary | ICD-10-CM | POA: Diagnosis not present

## 2015-04-30 DIAGNOSIS — G2581 Restless legs syndrome: Secondary | ICD-10-CM | POA: Diagnosis not present

## 2015-04-30 DIAGNOSIS — K5790 Diverticulosis of intestine, part unspecified, without perforation or abscess without bleeding: Secondary | ICD-10-CM | POA: Diagnosis not present

## 2015-04-30 DIAGNOSIS — G629 Polyneuropathy, unspecified: Secondary | ICD-10-CM | POA: Diagnosis not present

## 2015-04-30 DIAGNOSIS — I509 Heart failure, unspecified: Secondary | ICD-10-CM | POA: Diagnosis not present

## 2015-04-30 DIAGNOSIS — F322 Major depressive disorder, single episode, severe without psychotic features: Secondary | ICD-10-CM | POA: Diagnosis not present

## 2015-04-30 DIAGNOSIS — N183 Chronic kidney disease, stage 3 (moderate): Secondary | ICD-10-CM | POA: Diagnosis not present

## 2015-04-30 DIAGNOSIS — I252 Old myocardial infarction: Secondary | ICD-10-CM | POA: Diagnosis not present

## 2015-04-30 DIAGNOSIS — M810 Age-related osteoporosis without current pathological fracture: Secondary | ICD-10-CM | POA: Diagnosis not present

## 2015-05-01 DIAGNOSIS — N183 Chronic kidney disease, stage 3 (moderate): Secondary | ICD-10-CM | POA: Diagnosis not present

## 2015-05-01 DIAGNOSIS — M170 Bilateral primary osteoarthritis of knee: Secondary | ICD-10-CM | POA: Diagnosis not present

## 2015-05-01 DIAGNOSIS — I252 Old myocardial infarction: Secondary | ICD-10-CM | POA: Diagnosis not present

## 2015-05-01 DIAGNOSIS — G629 Polyneuropathy, unspecified: Secondary | ICD-10-CM | POA: Diagnosis not present

## 2015-05-01 DIAGNOSIS — F419 Anxiety disorder, unspecified: Secondary | ICD-10-CM | POA: Diagnosis not present

## 2015-05-01 DIAGNOSIS — I130 Hypertensive heart and chronic kidney disease with heart failure and stage 1 through stage 4 chronic kidney disease, or unspecified chronic kidney disease: Secondary | ICD-10-CM | POA: Diagnosis not present

## 2015-05-01 DIAGNOSIS — K5790 Diverticulosis of intestine, part unspecified, without perforation or abscess without bleeding: Secondary | ICD-10-CM | POA: Diagnosis not present

## 2015-05-01 DIAGNOSIS — I509 Heart failure, unspecified: Secondary | ICD-10-CM | POA: Diagnosis not present

## 2015-05-01 DIAGNOSIS — M810 Age-related osteoporosis without current pathological fracture: Secondary | ICD-10-CM | POA: Diagnosis not present

## 2015-05-01 DIAGNOSIS — I2510 Atherosclerotic heart disease of native coronary artery without angina pectoris: Secondary | ICD-10-CM | POA: Diagnosis not present

## 2015-05-01 DIAGNOSIS — J449 Chronic obstructive pulmonary disease, unspecified: Secondary | ICD-10-CM | POA: Diagnosis not present

## 2015-05-01 DIAGNOSIS — F322 Major depressive disorder, single episode, severe without psychotic features: Secondary | ICD-10-CM | POA: Diagnosis not present

## 2015-05-01 DIAGNOSIS — G2581 Restless legs syndrome: Secondary | ICD-10-CM | POA: Diagnosis not present

## 2015-05-02 DIAGNOSIS — I130 Hypertensive heart and chronic kidney disease with heart failure and stage 1 through stage 4 chronic kidney disease, or unspecified chronic kidney disease: Secondary | ICD-10-CM | POA: Diagnosis not present

## 2015-05-02 DIAGNOSIS — F419 Anxiety disorder, unspecified: Secondary | ICD-10-CM | POA: Diagnosis not present

## 2015-05-02 DIAGNOSIS — I252 Old myocardial infarction: Secondary | ICD-10-CM | POA: Diagnosis not present

## 2015-05-02 DIAGNOSIS — J449 Chronic obstructive pulmonary disease, unspecified: Secondary | ICD-10-CM | POA: Diagnosis not present

## 2015-05-02 DIAGNOSIS — M810 Age-related osteoporosis without current pathological fracture: Secondary | ICD-10-CM | POA: Diagnosis not present

## 2015-05-02 DIAGNOSIS — M170 Bilateral primary osteoarthritis of knee: Secondary | ICD-10-CM | POA: Diagnosis not present

## 2015-05-02 DIAGNOSIS — I2510 Atherosclerotic heart disease of native coronary artery without angina pectoris: Secondary | ICD-10-CM | POA: Diagnosis not present

## 2015-05-02 DIAGNOSIS — N183 Chronic kidney disease, stage 3 (moderate): Secondary | ICD-10-CM | POA: Diagnosis not present

## 2015-05-02 DIAGNOSIS — I509 Heart failure, unspecified: Secondary | ICD-10-CM | POA: Diagnosis not present

## 2015-05-02 DIAGNOSIS — G629 Polyneuropathy, unspecified: Secondary | ICD-10-CM | POA: Diagnosis not present

## 2015-05-02 DIAGNOSIS — F322 Major depressive disorder, single episode, severe without psychotic features: Secondary | ICD-10-CM | POA: Diagnosis not present

## 2015-05-02 DIAGNOSIS — K5790 Diverticulosis of intestine, part unspecified, without perforation or abscess without bleeding: Secondary | ICD-10-CM | POA: Diagnosis not present

## 2015-05-02 DIAGNOSIS — G2581 Restless legs syndrome: Secondary | ICD-10-CM | POA: Diagnosis not present

## 2015-05-03 DIAGNOSIS — I509 Heart failure, unspecified: Secondary | ICD-10-CM | POA: Diagnosis not present

## 2015-05-03 DIAGNOSIS — N183 Chronic kidney disease, stage 3 (moderate): Secondary | ICD-10-CM | POA: Diagnosis not present

## 2015-05-03 DIAGNOSIS — I130 Hypertensive heart and chronic kidney disease with heart failure and stage 1 through stage 4 chronic kidney disease, or unspecified chronic kidney disease: Secondary | ICD-10-CM | POA: Diagnosis not present

## 2015-05-03 DIAGNOSIS — F419 Anxiety disorder, unspecified: Secondary | ICD-10-CM | POA: Diagnosis not present

## 2015-05-03 DIAGNOSIS — M810 Age-related osteoporosis without current pathological fracture: Secondary | ICD-10-CM | POA: Diagnosis not present

## 2015-05-03 DIAGNOSIS — I2510 Atherosclerotic heart disease of native coronary artery without angina pectoris: Secondary | ICD-10-CM | POA: Diagnosis not present

## 2015-05-03 DIAGNOSIS — K5790 Diverticulosis of intestine, part unspecified, without perforation or abscess without bleeding: Secondary | ICD-10-CM | POA: Diagnosis not present

## 2015-05-03 DIAGNOSIS — I252 Old myocardial infarction: Secondary | ICD-10-CM | POA: Diagnosis not present

## 2015-05-03 DIAGNOSIS — G2581 Restless legs syndrome: Secondary | ICD-10-CM | POA: Diagnosis not present

## 2015-05-03 DIAGNOSIS — G629 Polyneuropathy, unspecified: Secondary | ICD-10-CM | POA: Diagnosis not present

## 2015-05-03 DIAGNOSIS — F322 Major depressive disorder, single episode, severe without psychotic features: Secondary | ICD-10-CM | POA: Diagnosis not present

## 2015-05-03 DIAGNOSIS — M170 Bilateral primary osteoarthritis of knee: Secondary | ICD-10-CM | POA: Diagnosis not present

## 2015-05-03 DIAGNOSIS — J449 Chronic obstructive pulmonary disease, unspecified: Secondary | ICD-10-CM | POA: Diagnosis not present

## 2015-05-06 DIAGNOSIS — I509 Heart failure, unspecified: Secondary | ICD-10-CM | POA: Diagnosis not present

## 2015-05-06 DIAGNOSIS — G2581 Restless legs syndrome: Secondary | ICD-10-CM | POA: Diagnosis not present

## 2015-05-06 DIAGNOSIS — K5790 Diverticulosis of intestine, part unspecified, without perforation or abscess without bleeding: Secondary | ICD-10-CM | POA: Diagnosis not present

## 2015-05-06 DIAGNOSIS — M810 Age-related osteoporosis without current pathological fracture: Secondary | ICD-10-CM | POA: Diagnosis not present

## 2015-05-06 DIAGNOSIS — G629 Polyneuropathy, unspecified: Secondary | ICD-10-CM | POA: Diagnosis not present

## 2015-05-06 DIAGNOSIS — I252 Old myocardial infarction: Secondary | ICD-10-CM | POA: Diagnosis not present

## 2015-05-06 DIAGNOSIS — I130 Hypertensive heart and chronic kidney disease with heart failure and stage 1 through stage 4 chronic kidney disease, or unspecified chronic kidney disease: Secondary | ICD-10-CM | POA: Diagnosis not present

## 2015-05-06 DIAGNOSIS — J449 Chronic obstructive pulmonary disease, unspecified: Secondary | ICD-10-CM | POA: Diagnosis not present

## 2015-05-06 DIAGNOSIS — F419 Anxiety disorder, unspecified: Secondary | ICD-10-CM | POA: Diagnosis not present

## 2015-05-06 DIAGNOSIS — N183 Chronic kidney disease, stage 3 (moderate): Secondary | ICD-10-CM | POA: Diagnosis not present

## 2015-05-06 DIAGNOSIS — I2510 Atherosclerotic heart disease of native coronary artery without angina pectoris: Secondary | ICD-10-CM | POA: Diagnosis not present

## 2015-05-06 DIAGNOSIS — M170 Bilateral primary osteoarthritis of knee: Secondary | ICD-10-CM | POA: Diagnosis not present

## 2015-05-06 DIAGNOSIS — F322 Major depressive disorder, single episode, severe without psychotic features: Secondary | ICD-10-CM | POA: Diagnosis not present

## 2015-05-07 DIAGNOSIS — G629 Polyneuropathy, unspecified: Secondary | ICD-10-CM | POA: Diagnosis not present

## 2015-05-07 DIAGNOSIS — I2510 Atherosclerotic heart disease of native coronary artery without angina pectoris: Secondary | ICD-10-CM | POA: Diagnosis not present

## 2015-05-07 DIAGNOSIS — J449 Chronic obstructive pulmonary disease, unspecified: Secondary | ICD-10-CM | POA: Diagnosis not present

## 2015-05-07 DIAGNOSIS — F419 Anxiety disorder, unspecified: Secondary | ICD-10-CM | POA: Diagnosis not present

## 2015-05-07 DIAGNOSIS — K5790 Diverticulosis of intestine, part unspecified, without perforation or abscess without bleeding: Secondary | ICD-10-CM | POA: Diagnosis not present

## 2015-05-07 DIAGNOSIS — I252 Old myocardial infarction: Secondary | ICD-10-CM | POA: Diagnosis not present

## 2015-05-07 DIAGNOSIS — F322 Major depressive disorder, single episode, severe without psychotic features: Secondary | ICD-10-CM | POA: Diagnosis not present

## 2015-05-07 DIAGNOSIS — G2581 Restless legs syndrome: Secondary | ICD-10-CM | POA: Diagnosis not present

## 2015-05-07 DIAGNOSIS — I130 Hypertensive heart and chronic kidney disease with heart failure and stage 1 through stage 4 chronic kidney disease, or unspecified chronic kidney disease: Secondary | ICD-10-CM | POA: Diagnosis not present

## 2015-05-07 DIAGNOSIS — I509 Heart failure, unspecified: Secondary | ICD-10-CM | POA: Diagnosis not present

## 2015-05-07 DIAGNOSIS — N183 Chronic kidney disease, stage 3 (moderate): Secondary | ICD-10-CM | POA: Diagnosis not present

## 2015-05-07 DIAGNOSIS — M170 Bilateral primary osteoarthritis of knee: Secondary | ICD-10-CM | POA: Diagnosis not present

## 2015-05-07 DIAGNOSIS — M810 Age-related osteoporosis without current pathological fracture: Secondary | ICD-10-CM | POA: Diagnosis not present

## 2015-05-09 ENCOUNTER — Telehealth: Payer: Self-pay

## 2015-05-09 DIAGNOSIS — J449 Chronic obstructive pulmonary disease, unspecified: Secondary | ICD-10-CM | POA: Diagnosis not present

## 2015-05-09 DIAGNOSIS — F322 Major depressive disorder, single episode, severe without psychotic features: Secondary | ICD-10-CM | POA: Diagnosis not present

## 2015-05-09 DIAGNOSIS — K5790 Diverticulosis of intestine, part unspecified, without perforation or abscess without bleeding: Secondary | ICD-10-CM | POA: Diagnosis not present

## 2015-05-09 DIAGNOSIS — I252 Old myocardial infarction: Secondary | ICD-10-CM | POA: Diagnosis not present

## 2015-05-09 DIAGNOSIS — G629 Polyneuropathy, unspecified: Secondary | ICD-10-CM | POA: Diagnosis not present

## 2015-05-09 DIAGNOSIS — F419 Anxiety disorder, unspecified: Secondary | ICD-10-CM | POA: Diagnosis not present

## 2015-05-09 DIAGNOSIS — G2581 Restless legs syndrome: Secondary | ICD-10-CM | POA: Diagnosis not present

## 2015-05-09 DIAGNOSIS — M810 Age-related osteoporosis without current pathological fracture: Secondary | ICD-10-CM | POA: Diagnosis not present

## 2015-05-09 DIAGNOSIS — I130 Hypertensive heart and chronic kidney disease with heart failure and stage 1 through stage 4 chronic kidney disease, or unspecified chronic kidney disease: Secondary | ICD-10-CM | POA: Diagnosis not present

## 2015-05-09 DIAGNOSIS — I509 Heart failure, unspecified: Secondary | ICD-10-CM | POA: Diagnosis not present

## 2015-05-09 DIAGNOSIS — N183 Chronic kidney disease, stage 3 (moderate): Secondary | ICD-10-CM | POA: Diagnosis not present

## 2015-05-09 DIAGNOSIS — I2510 Atherosclerotic heart disease of native coronary artery without angina pectoris: Secondary | ICD-10-CM | POA: Diagnosis not present

## 2015-05-09 DIAGNOSIS — M170 Bilateral primary osteoarthritis of knee: Secondary | ICD-10-CM | POA: Diagnosis not present

## 2015-05-09 MED ORDER — PRAVASTATIN SODIUM 80 MG PO TABS: ORAL_TABLET | ORAL | Status: DC

## 2015-05-09 NOTE — Telephone Encounter (Signed)
Pt calling asking for a refill on pravastatin rx sent to pharmacy by e-script

## 2015-05-09 NOTE — Telephone Encounter (Signed)
Patient called wanting to speak with you. She would not give me information as to why. Patient stated that she just needed to speak with [facility]. Thanks!

## 2015-05-10 DIAGNOSIS — F322 Major depressive disorder, single episode, severe without psychotic features: Secondary | ICD-10-CM | POA: Diagnosis not present

## 2015-05-10 DIAGNOSIS — I509 Heart failure, unspecified: Secondary | ICD-10-CM | POA: Diagnosis not present

## 2015-05-10 DIAGNOSIS — J449 Chronic obstructive pulmonary disease, unspecified: Secondary | ICD-10-CM | POA: Diagnosis not present

## 2015-05-10 DIAGNOSIS — N183 Chronic kidney disease, stage 3 (moderate): Secondary | ICD-10-CM | POA: Diagnosis not present

## 2015-05-10 DIAGNOSIS — I252 Old myocardial infarction: Secondary | ICD-10-CM | POA: Diagnosis not present

## 2015-05-10 DIAGNOSIS — G2581 Restless legs syndrome: Secondary | ICD-10-CM | POA: Diagnosis not present

## 2015-05-10 DIAGNOSIS — M170 Bilateral primary osteoarthritis of knee: Secondary | ICD-10-CM | POA: Diagnosis not present

## 2015-05-10 DIAGNOSIS — I2510 Atherosclerotic heart disease of native coronary artery without angina pectoris: Secondary | ICD-10-CM | POA: Diagnosis not present

## 2015-05-10 DIAGNOSIS — K5790 Diverticulosis of intestine, part unspecified, without perforation or abscess without bleeding: Secondary | ICD-10-CM | POA: Diagnosis not present

## 2015-05-10 DIAGNOSIS — M810 Age-related osteoporosis without current pathological fracture: Secondary | ICD-10-CM | POA: Diagnosis not present

## 2015-05-10 DIAGNOSIS — G629 Polyneuropathy, unspecified: Secondary | ICD-10-CM | POA: Diagnosis not present

## 2015-05-10 DIAGNOSIS — I119 Hypertensive heart disease without heart failure: Secondary | ICD-10-CM | POA: Diagnosis not present

## 2015-05-10 DIAGNOSIS — I130 Hypertensive heart and chronic kidney disease with heart failure and stage 1 through stage 4 chronic kidney disease, or unspecified chronic kidney disease: Secondary | ICD-10-CM | POA: Diagnosis not present

## 2015-05-10 DIAGNOSIS — D649 Anemia, unspecified: Secondary | ICD-10-CM | POA: Diagnosis not present

## 2015-05-10 DIAGNOSIS — E785 Hyperlipidemia, unspecified: Secondary | ICD-10-CM | POA: Diagnosis not present

## 2015-05-10 DIAGNOSIS — F419 Anxiety disorder, unspecified: Secondary | ICD-10-CM | POA: Diagnosis not present

## 2015-05-13 DIAGNOSIS — M810 Age-related osteoporosis without current pathological fracture: Secondary | ICD-10-CM | POA: Diagnosis not present

## 2015-05-13 DIAGNOSIS — F419 Anxiety disorder, unspecified: Secondary | ICD-10-CM | POA: Diagnosis not present

## 2015-05-13 DIAGNOSIS — I2510 Atherosclerotic heart disease of native coronary artery without angina pectoris: Secondary | ICD-10-CM | POA: Diagnosis not present

## 2015-05-13 DIAGNOSIS — G2581 Restless legs syndrome: Secondary | ICD-10-CM | POA: Diagnosis not present

## 2015-05-13 DIAGNOSIS — I130 Hypertensive heart and chronic kidney disease with heart failure and stage 1 through stage 4 chronic kidney disease, or unspecified chronic kidney disease: Secondary | ICD-10-CM | POA: Diagnosis not present

## 2015-05-13 DIAGNOSIS — N183 Chronic kidney disease, stage 3 (moderate): Secondary | ICD-10-CM | POA: Diagnosis not present

## 2015-05-13 DIAGNOSIS — M170 Bilateral primary osteoarthritis of knee: Secondary | ICD-10-CM | POA: Diagnosis not present

## 2015-05-13 DIAGNOSIS — K5790 Diverticulosis of intestine, part unspecified, without perforation or abscess without bleeding: Secondary | ICD-10-CM | POA: Diagnosis not present

## 2015-05-13 DIAGNOSIS — F322 Major depressive disorder, single episode, severe without psychotic features: Secondary | ICD-10-CM | POA: Diagnosis not present

## 2015-05-13 DIAGNOSIS — G629 Polyneuropathy, unspecified: Secondary | ICD-10-CM | POA: Diagnosis not present

## 2015-05-13 DIAGNOSIS — I509 Heart failure, unspecified: Secondary | ICD-10-CM | POA: Diagnosis not present

## 2015-05-13 DIAGNOSIS — I252 Old myocardial infarction: Secondary | ICD-10-CM | POA: Diagnosis not present

## 2015-05-13 DIAGNOSIS — J449 Chronic obstructive pulmonary disease, unspecified: Secondary | ICD-10-CM | POA: Diagnosis not present

## 2015-05-14 DIAGNOSIS — J449 Chronic obstructive pulmonary disease, unspecified: Secondary | ICD-10-CM | POA: Diagnosis not present

## 2015-05-14 DIAGNOSIS — I2510 Atherosclerotic heart disease of native coronary artery without angina pectoris: Secondary | ICD-10-CM | POA: Diagnosis not present

## 2015-05-14 DIAGNOSIS — I509 Heart failure, unspecified: Secondary | ICD-10-CM | POA: Diagnosis not present

## 2015-05-14 DIAGNOSIS — G2581 Restless legs syndrome: Secondary | ICD-10-CM | POA: Diagnosis not present

## 2015-05-14 DIAGNOSIS — F322 Major depressive disorder, single episode, severe without psychotic features: Secondary | ICD-10-CM | POA: Diagnosis not present

## 2015-05-14 DIAGNOSIS — M170 Bilateral primary osteoarthritis of knee: Secondary | ICD-10-CM | POA: Diagnosis not present

## 2015-05-14 DIAGNOSIS — G629 Polyneuropathy, unspecified: Secondary | ICD-10-CM | POA: Diagnosis not present

## 2015-05-14 DIAGNOSIS — I130 Hypertensive heart and chronic kidney disease with heart failure and stage 1 through stage 4 chronic kidney disease, or unspecified chronic kidney disease: Secondary | ICD-10-CM | POA: Diagnosis not present

## 2015-05-14 DIAGNOSIS — F419 Anxiety disorder, unspecified: Secondary | ICD-10-CM | POA: Diagnosis not present

## 2015-05-14 DIAGNOSIS — N183 Chronic kidney disease, stage 3 (moderate): Secondary | ICD-10-CM | POA: Diagnosis not present

## 2015-05-14 DIAGNOSIS — M810 Age-related osteoporosis without current pathological fracture: Secondary | ICD-10-CM | POA: Diagnosis not present

## 2015-05-14 DIAGNOSIS — I252 Old myocardial infarction: Secondary | ICD-10-CM | POA: Diagnosis not present

## 2015-05-14 DIAGNOSIS — K5790 Diverticulosis of intestine, part unspecified, without perforation or abscess without bleeding: Secondary | ICD-10-CM | POA: Diagnosis not present

## 2015-05-15 DIAGNOSIS — I252 Old myocardial infarction: Secondary | ICD-10-CM | POA: Diagnosis not present

## 2015-05-15 DIAGNOSIS — N183 Chronic kidney disease, stage 3 (moderate): Secondary | ICD-10-CM | POA: Diagnosis not present

## 2015-05-15 DIAGNOSIS — J449 Chronic obstructive pulmonary disease, unspecified: Secondary | ICD-10-CM | POA: Diagnosis not present

## 2015-05-15 DIAGNOSIS — F419 Anxiety disorder, unspecified: Secondary | ICD-10-CM | POA: Diagnosis not present

## 2015-05-15 DIAGNOSIS — M170 Bilateral primary osteoarthritis of knee: Secondary | ICD-10-CM | POA: Diagnosis not present

## 2015-05-15 DIAGNOSIS — I2510 Atherosclerotic heart disease of native coronary artery without angina pectoris: Secondary | ICD-10-CM | POA: Diagnosis not present

## 2015-05-15 DIAGNOSIS — K5790 Diverticulosis of intestine, part unspecified, without perforation or abscess without bleeding: Secondary | ICD-10-CM | POA: Diagnosis not present

## 2015-05-15 DIAGNOSIS — M810 Age-related osteoporosis without current pathological fracture: Secondary | ICD-10-CM | POA: Diagnosis not present

## 2015-05-15 DIAGNOSIS — G629 Polyneuropathy, unspecified: Secondary | ICD-10-CM | POA: Diagnosis not present

## 2015-05-15 DIAGNOSIS — I509 Heart failure, unspecified: Secondary | ICD-10-CM | POA: Diagnosis not present

## 2015-05-15 DIAGNOSIS — F322 Major depressive disorder, single episode, severe without psychotic features: Secondary | ICD-10-CM | POA: Diagnosis not present

## 2015-05-15 DIAGNOSIS — I130 Hypertensive heart and chronic kidney disease with heart failure and stage 1 through stage 4 chronic kidney disease, or unspecified chronic kidney disease: Secondary | ICD-10-CM | POA: Diagnosis not present

## 2015-05-15 DIAGNOSIS — G2581 Restless legs syndrome: Secondary | ICD-10-CM | POA: Diagnosis not present

## 2015-05-16 DIAGNOSIS — M170 Bilateral primary osteoarthritis of knee: Secondary | ICD-10-CM | POA: Diagnosis not present

## 2015-05-16 DIAGNOSIS — J449 Chronic obstructive pulmonary disease, unspecified: Secondary | ICD-10-CM | POA: Diagnosis not present

## 2015-05-16 DIAGNOSIS — N183 Chronic kidney disease, stage 3 (moderate): Secondary | ICD-10-CM | POA: Diagnosis not present

## 2015-05-16 DIAGNOSIS — M810 Age-related osteoporosis without current pathological fracture: Secondary | ICD-10-CM | POA: Diagnosis not present

## 2015-05-16 DIAGNOSIS — F419 Anxiety disorder, unspecified: Secondary | ICD-10-CM | POA: Diagnosis not present

## 2015-05-16 DIAGNOSIS — K5790 Diverticulosis of intestine, part unspecified, without perforation or abscess without bleeding: Secondary | ICD-10-CM | POA: Diagnosis not present

## 2015-05-16 DIAGNOSIS — I2510 Atherosclerotic heart disease of native coronary artery without angina pectoris: Secondary | ICD-10-CM | POA: Diagnosis not present

## 2015-05-16 DIAGNOSIS — I130 Hypertensive heart and chronic kidney disease with heart failure and stage 1 through stage 4 chronic kidney disease, or unspecified chronic kidney disease: Secondary | ICD-10-CM | POA: Diagnosis not present

## 2015-05-16 DIAGNOSIS — G2581 Restless legs syndrome: Secondary | ICD-10-CM | POA: Diagnosis not present

## 2015-05-16 DIAGNOSIS — I252 Old myocardial infarction: Secondary | ICD-10-CM | POA: Diagnosis not present

## 2015-05-16 DIAGNOSIS — G629 Polyneuropathy, unspecified: Secondary | ICD-10-CM | POA: Diagnosis not present

## 2015-05-16 DIAGNOSIS — I509 Heart failure, unspecified: Secondary | ICD-10-CM | POA: Diagnosis not present

## 2015-05-16 DIAGNOSIS — F322 Major depressive disorder, single episode, severe without psychotic features: Secondary | ICD-10-CM | POA: Diagnosis not present

## 2015-05-18 DIAGNOSIS — G2581 Restless legs syndrome: Secondary | ICD-10-CM | POA: Diagnosis not present

## 2015-05-18 DIAGNOSIS — K5790 Diverticulosis of intestine, part unspecified, without perforation or abscess without bleeding: Secondary | ICD-10-CM | POA: Diagnosis not present

## 2015-05-18 DIAGNOSIS — J449 Chronic obstructive pulmonary disease, unspecified: Secondary | ICD-10-CM | POA: Diagnosis not present

## 2015-05-18 DIAGNOSIS — I509 Heart failure, unspecified: Secondary | ICD-10-CM | POA: Diagnosis not present

## 2015-05-18 DIAGNOSIS — I252 Old myocardial infarction: Secondary | ICD-10-CM | POA: Diagnosis not present

## 2015-05-18 DIAGNOSIS — F419 Anxiety disorder, unspecified: Secondary | ICD-10-CM | POA: Diagnosis not present

## 2015-05-18 DIAGNOSIS — F322 Major depressive disorder, single episode, severe without psychotic features: Secondary | ICD-10-CM | POA: Diagnosis not present

## 2015-05-18 DIAGNOSIS — I2510 Atherosclerotic heart disease of native coronary artery without angina pectoris: Secondary | ICD-10-CM | POA: Diagnosis not present

## 2015-05-18 DIAGNOSIS — N183 Chronic kidney disease, stage 3 (moderate): Secondary | ICD-10-CM | POA: Diagnosis not present

## 2015-05-18 DIAGNOSIS — I130 Hypertensive heart and chronic kidney disease with heart failure and stage 1 through stage 4 chronic kidney disease, or unspecified chronic kidney disease: Secondary | ICD-10-CM | POA: Diagnosis not present

## 2015-05-18 DIAGNOSIS — M170 Bilateral primary osteoarthritis of knee: Secondary | ICD-10-CM | POA: Diagnosis not present

## 2015-05-18 DIAGNOSIS — M810 Age-related osteoporosis without current pathological fracture: Secondary | ICD-10-CM | POA: Diagnosis not present

## 2015-05-18 DIAGNOSIS — G629 Polyneuropathy, unspecified: Secondary | ICD-10-CM | POA: Diagnosis not present

## 2015-05-20 DIAGNOSIS — I509 Heart failure, unspecified: Secondary | ICD-10-CM | POA: Diagnosis not present

## 2015-05-20 DIAGNOSIS — M810 Age-related osteoporosis without current pathological fracture: Secondary | ICD-10-CM | POA: Diagnosis not present

## 2015-05-20 DIAGNOSIS — I2510 Atherosclerotic heart disease of native coronary artery without angina pectoris: Secondary | ICD-10-CM | POA: Diagnosis not present

## 2015-05-20 DIAGNOSIS — J449 Chronic obstructive pulmonary disease, unspecified: Secondary | ICD-10-CM | POA: Diagnosis not present

## 2015-05-20 DIAGNOSIS — G2581 Restless legs syndrome: Secondary | ICD-10-CM | POA: Diagnosis not present

## 2015-05-20 DIAGNOSIS — I130 Hypertensive heart and chronic kidney disease with heart failure and stage 1 through stage 4 chronic kidney disease, or unspecified chronic kidney disease: Secondary | ICD-10-CM | POA: Diagnosis not present

## 2015-05-20 DIAGNOSIS — F419 Anxiety disorder, unspecified: Secondary | ICD-10-CM | POA: Diagnosis not present

## 2015-05-20 DIAGNOSIS — M170 Bilateral primary osteoarthritis of knee: Secondary | ICD-10-CM | POA: Diagnosis not present

## 2015-05-20 DIAGNOSIS — N183 Chronic kidney disease, stage 3 (moderate): Secondary | ICD-10-CM | POA: Diagnosis not present

## 2015-05-20 DIAGNOSIS — K5790 Diverticulosis of intestine, part unspecified, without perforation or abscess without bleeding: Secondary | ICD-10-CM | POA: Diagnosis not present

## 2015-05-20 DIAGNOSIS — I252 Old myocardial infarction: Secondary | ICD-10-CM | POA: Diagnosis not present

## 2015-05-20 DIAGNOSIS — F322 Major depressive disorder, single episode, severe without psychotic features: Secondary | ICD-10-CM | POA: Diagnosis not present

## 2015-05-20 DIAGNOSIS — G629 Polyneuropathy, unspecified: Secondary | ICD-10-CM | POA: Diagnosis not present

## 2015-05-21 DIAGNOSIS — I509 Heart failure, unspecified: Secondary | ICD-10-CM | POA: Diagnosis not present

## 2015-05-21 DIAGNOSIS — K5790 Diverticulosis of intestine, part unspecified, without perforation or abscess without bleeding: Secondary | ICD-10-CM | POA: Diagnosis not present

## 2015-05-21 DIAGNOSIS — J449 Chronic obstructive pulmonary disease, unspecified: Secondary | ICD-10-CM | POA: Diagnosis not present

## 2015-05-21 DIAGNOSIS — M810 Age-related osteoporosis without current pathological fracture: Secondary | ICD-10-CM | POA: Diagnosis not present

## 2015-05-21 DIAGNOSIS — N183 Chronic kidney disease, stage 3 (moderate): Secondary | ICD-10-CM | POA: Diagnosis not present

## 2015-05-21 DIAGNOSIS — F419 Anxiety disorder, unspecified: Secondary | ICD-10-CM | POA: Diagnosis not present

## 2015-05-21 DIAGNOSIS — I2510 Atherosclerotic heart disease of native coronary artery without angina pectoris: Secondary | ICD-10-CM | POA: Diagnosis not present

## 2015-05-21 DIAGNOSIS — I130 Hypertensive heart and chronic kidney disease with heart failure and stage 1 through stage 4 chronic kidney disease, or unspecified chronic kidney disease: Secondary | ICD-10-CM | POA: Diagnosis not present

## 2015-05-21 DIAGNOSIS — M170 Bilateral primary osteoarthritis of knee: Secondary | ICD-10-CM | POA: Diagnosis not present

## 2015-05-21 DIAGNOSIS — I252 Old myocardial infarction: Secondary | ICD-10-CM | POA: Diagnosis not present

## 2015-05-21 DIAGNOSIS — G2581 Restless legs syndrome: Secondary | ICD-10-CM | POA: Diagnosis not present

## 2015-05-21 DIAGNOSIS — G629 Polyneuropathy, unspecified: Secondary | ICD-10-CM | POA: Diagnosis not present

## 2015-05-21 DIAGNOSIS — F322 Major depressive disorder, single episode, severe without psychotic features: Secondary | ICD-10-CM | POA: Diagnosis not present

## 2015-05-22 DIAGNOSIS — J449 Chronic obstructive pulmonary disease, unspecified: Secondary | ICD-10-CM | POA: Diagnosis not present

## 2015-05-22 DIAGNOSIS — I509 Heart failure, unspecified: Secondary | ICD-10-CM | POA: Diagnosis not present

## 2015-05-22 DIAGNOSIS — I130 Hypertensive heart and chronic kidney disease with heart failure and stage 1 through stage 4 chronic kidney disease, or unspecified chronic kidney disease: Secondary | ICD-10-CM | POA: Diagnosis not present

## 2015-05-22 DIAGNOSIS — M810 Age-related osteoporosis without current pathological fracture: Secondary | ICD-10-CM | POA: Diagnosis not present

## 2015-05-22 DIAGNOSIS — G2581 Restless legs syndrome: Secondary | ICD-10-CM | POA: Diagnosis not present

## 2015-05-22 DIAGNOSIS — I2510 Atherosclerotic heart disease of native coronary artery without angina pectoris: Secondary | ICD-10-CM | POA: Diagnosis not present

## 2015-05-22 DIAGNOSIS — I252 Old myocardial infarction: Secondary | ICD-10-CM | POA: Diagnosis not present

## 2015-05-22 DIAGNOSIS — M170 Bilateral primary osteoarthritis of knee: Secondary | ICD-10-CM | POA: Diagnosis not present

## 2015-05-22 DIAGNOSIS — F419 Anxiety disorder, unspecified: Secondary | ICD-10-CM | POA: Diagnosis not present

## 2015-05-22 DIAGNOSIS — G629 Polyneuropathy, unspecified: Secondary | ICD-10-CM | POA: Diagnosis not present

## 2015-05-22 DIAGNOSIS — F322 Major depressive disorder, single episode, severe without psychotic features: Secondary | ICD-10-CM | POA: Diagnosis not present

## 2015-05-22 DIAGNOSIS — K5790 Diverticulosis of intestine, part unspecified, without perforation or abscess without bleeding: Secondary | ICD-10-CM | POA: Diagnosis not present

## 2015-05-22 DIAGNOSIS — N183 Chronic kidney disease, stage 3 (moderate): Secondary | ICD-10-CM | POA: Diagnosis not present

## 2015-05-23 DIAGNOSIS — F419 Anxiety disorder, unspecified: Secondary | ICD-10-CM | POA: Diagnosis not present

## 2015-05-23 DIAGNOSIS — M170 Bilateral primary osteoarthritis of knee: Secondary | ICD-10-CM | POA: Diagnosis not present

## 2015-05-23 DIAGNOSIS — I2510 Atherosclerotic heart disease of native coronary artery without angina pectoris: Secondary | ICD-10-CM | POA: Diagnosis not present

## 2015-05-23 DIAGNOSIS — N183 Chronic kidney disease, stage 3 (moderate): Secondary | ICD-10-CM | POA: Diagnosis not present

## 2015-05-23 DIAGNOSIS — G629 Polyneuropathy, unspecified: Secondary | ICD-10-CM | POA: Diagnosis not present

## 2015-05-23 DIAGNOSIS — G2581 Restless legs syndrome: Secondary | ICD-10-CM | POA: Diagnosis not present

## 2015-05-23 DIAGNOSIS — I509 Heart failure, unspecified: Secondary | ICD-10-CM | POA: Diagnosis not present

## 2015-05-23 DIAGNOSIS — M810 Age-related osteoporosis without current pathological fracture: Secondary | ICD-10-CM | POA: Diagnosis not present

## 2015-05-23 DIAGNOSIS — J449 Chronic obstructive pulmonary disease, unspecified: Secondary | ICD-10-CM | POA: Diagnosis not present

## 2015-05-23 DIAGNOSIS — K5790 Diverticulosis of intestine, part unspecified, without perforation or abscess without bleeding: Secondary | ICD-10-CM | POA: Diagnosis not present

## 2015-05-23 DIAGNOSIS — F322 Major depressive disorder, single episode, severe without psychotic features: Secondary | ICD-10-CM | POA: Diagnosis not present

## 2015-05-23 DIAGNOSIS — I130 Hypertensive heart and chronic kidney disease with heart failure and stage 1 through stage 4 chronic kidney disease, or unspecified chronic kidney disease: Secondary | ICD-10-CM | POA: Diagnosis not present

## 2015-05-23 DIAGNOSIS — I252 Old myocardial infarction: Secondary | ICD-10-CM | POA: Diagnosis not present

## 2015-05-24 DIAGNOSIS — M170 Bilateral primary osteoarthritis of knee: Secondary | ICD-10-CM | POA: Diagnosis not present

## 2015-05-24 DIAGNOSIS — I2510 Atherosclerotic heart disease of native coronary artery without angina pectoris: Secondary | ICD-10-CM | POA: Diagnosis not present

## 2015-05-24 DIAGNOSIS — I252 Old myocardial infarction: Secondary | ICD-10-CM | POA: Diagnosis not present

## 2015-05-24 DIAGNOSIS — M810 Age-related osteoporosis without current pathological fracture: Secondary | ICD-10-CM | POA: Diagnosis not present

## 2015-05-24 DIAGNOSIS — I130 Hypertensive heart and chronic kidney disease with heart failure and stage 1 through stage 4 chronic kidney disease, or unspecified chronic kidney disease: Secondary | ICD-10-CM | POA: Diagnosis not present

## 2015-05-24 DIAGNOSIS — G2581 Restless legs syndrome: Secondary | ICD-10-CM | POA: Diagnosis not present

## 2015-05-24 DIAGNOSIS — F322 Major depressive disorder, single episode, severe without psychotic features: Secondary | ICD-10-CM | POA: Diagnosis not present

## 2015-05-24 DIAGNOSIS — J449 Chronic obstructive pulmonary disease, unspecified: Secondary | ICD-10-CM | POA: Diagnosis not present

## 2015-05-24 DIAGNOSIS — G629 Polyneuropathy, unspecified: Secondary | ICD-10-CM | POA: Diagnosis not present

## 2015-05-24 DIAGNOSIS — S2231XA Fracture of one rib, right side, initial encounter for closed fracture: Secondary | ICD-10-CM | POA: Diagnosis not present

## 2015-05-24 DIAGNOSIS — K5790 Diverticulosis of intestine, part unspecified, without perforation or abscess without bleeding: Secondary | ICD-10-CM | POA: Diagnosis not present

## 2015-05-24 DIAGNOSIS — N183 Chronic kidney disease, stage 3 (moderate): Secondary | ICD-10-CM | POA: Diagnosis not present

## 2015-05-24 DIAGNOSIS — I509 Heart failure, unspecified: Secondary | ICD-10-CM | POA: Diagnosis not present

## 2015-05-24 DIAGNOSIS — F419 Anxiety disorder, unspecified: Secondary | ICD-10-CM | POA: Diagnosis not present

## 2015-05-27 DIAGNOSIS — M170 Bilateral primary osteoarthritis of knee: Secondary | ICD-10-CM | POA: Diagnosis not present

## 2015-05-27 DIAGNOSIS — I509 Heart failure, unspecified: Secondary | ICD-10-CM | POA: Diagnosis not present

## 2015-05-27 DIAGNOSIS — G629 Polyneuropathy, unspecified: Secondary | ICD-10-CM | POA: Diagnosis not present

## 2015-05-27 DIAGNOSIS — Z79891 Long term (current) use of opiate analgesic: Secondary | ICD-10-CM | POA: Diagnosis not present

## 2015-05-27 DIAGNOSIS — J449 Chronic obstructive pulmonary disease, unspecified: Secondary | ICD-10-CM | POA: Diagnosis not present

## 2015-05-27 DIAGNOSIS — I130 Hypertensive heart and chronic kidney disease with heart failure and stage 1 through stage 4 chronic kidney disease, or unspecified chronic kidney disease: Secondary | ICD-10-CM | POA: Diagnosis not present

## 2015-05-27 DIAGNOSIS — I2510 Atherosclerotic heart disease of native coronary artery without angina pectoris: Secondary | ICD-10-CM | POA: Diagnosis not present

## 2015-05-27 DIAGNOSIS — Z9181 History of falling: Secondary | ICD-10-CM | POA: Diagnosis not present

## 2015-05-27 DIAGNOSIS — N183 Chronic kidney disease, stage 3 (moderate): Secondary | ICD-10-CM | POA: Diagnosis not present

## 2015-05-28 DIAGNOSIS — G629 Polyneuropathy, unspecified: Secondary | ICD-10-CM | POA: Diagnosis not present

## 2015-05-28 DIAGNOSIS — N183 Chronic kidney disease, stage 3 (moderate): Secondary | ICD-10-CM | POA: Diagnosis not present

## 2015-05-28 DIAGNOSIS — I130 Hypertensive heart and chronic kidney disease with heart failure and stage 1 through stage 4 chronic kidney disease, or unspecified chronic kidney disease: Secondary | ICD-10-CM | POA: Diagnosis not present

## 2015-05-28 DIAGNOSIS — Z9181 History of falling: Secondary | ICD-10-CM | POA: Diagnosis not present

## 2015-05-28 DIAGNOSIS — I509 Heart failure, unspecified: Secondary | ICD-10-CM | POA: Diagnosis not present

## 2015-05-28 DIAGNOSIS — Z79891 Long term (current) use of opiate analgesic: Secondary | ICD-10-CM | POA: Diagnosis not present

## 2015-05-28 DIAGNOSIS — M170 Bilateral primary osteoarthritis of knee: Secondary | ICD-10-CM | POA: Diagnosis not present

## 2015-05-28 DIAGNOSIS — I2510 Atherosclerotic heart disease of native coronary artery without angina pectoris: Secondary | ICD-10-CM | POA: Diagnosis not present

## 2015-05-28 DIAGNOSIS — J449 Chronic obstructive pulmonary disease, unspecified: Secondary | ICD-10-CM | POA: Diagnosis not present

## 2015-05-29 DIAGNOSIS — I509 Heart failure, unspecified: Secondary | ICD-10-CM | POA: Diagnosis not present

## 2015-05-29 DIAGNOSIS — I130 Hypertensive heart and chronic kidney disease with heart failure and stage 1 through stage 4 chronic kidney disease, or unspecified chronic kidney disease: Secondary | ICD-10-CM | POA: Diagnosis not present

## 2015-05-29 DIAGNOSIS — Z79891 Long term (current) use of opiate analgesic: Secondary | ICD-10-CM | POA: Diagnosis not present

## 2015-05-29 DIAGNOSIS — N183 Chronic kidney disease, stage 3 (moderate): Secondary | ICD-10-CM | POA: Diagnosis not present

## 2015-05-29 DIAGNOSIS — G629 Polyneuropathy, unspecified: Secondary | ICD-10-CM | POA: Diagnosis not present

## 2015-05-29 DIAGNOSIS — M170 Bilateral primary osteoarthritis of knee: Secondary | ICD-10-CM | POA: Diagnosis not present

## 2015-05-29 DIAGNOSIS — Z9181 History of falling: Secondary | ICD-10-CM | POA: Diagnosis not present

## 2015-05-29 DIAGNOSIS — J449 Chronic obstructive pulmonary disease, unspecified: Secondary | ICD-10-CM | POA: Diagnosis not present

## 2015-05-29 DIAGNOSIS — I2510 Atherosclerotic heart disease of native coronary artery without angina pectoris: Secondary | ICD-10-CM | POA: Diagnosis not present

## 2015-05-30 DIAGNOSIS — Z9181 History of falling: Secondary | ICD-10-CM | POA: Diagnosis not present

## 2015-05-30 DIAGNOSIS — I130 Hypertensive heart and chronic kidney disease with heart failure and stage 1 through stage 4 chronic kidney disease, or unspecified chronic kidney disease: Secondary | ICD-10-CM | POA: Diagnosis not present

## 2015-05-30 DIAGNOSIS — Z79891 Long term (current) use of opiate analgesic: Secondary | ICD-10-CM | POA: Diagnosis not present

## 2015-05-30 DIAGNOSIS — I2510 Atherosclerotic heart disease of native coronary artery without angina pectoris: Secondary | ICD-10-CM | POA: Diagnosis not present

## 2015-05-30 DIAGNOSIS — I509 Heart failure, unspecified: Secondary | ICD-10-CM | POA: Diagnosis not present

## 2015-05-30 DIAGNOSIS — J449 Chronic obstructive pulmonary disease, unspecified: Secondary | ICD-10-CM | POA: Diagnosis not present

## 2015-05-30 DIAGNOSIS — M170 Bilateral primary osteoarthritis of knee: Secondary | ICD-10-CM | POA: Diagnosis not present

## 2015-05-30 DIAGNOSIS — N183 Chronic kidney disease, stage 3 (moderate): Secondary | ICD-10-CM | POA: Diagnosis not present

## 2015-05-30 DIAGNOSIS — G629 Polyneuropathy, unspecified: Secondary | ICD-10-CM | POA: Diagnosis not present

## 2015-06-03 DIAGNOSIS — I130 Hypertensive heart and chronic kidney disease with heart failure and stage 1 through stage 4 chronic kidney disease, or unspecified chronic kidney disease: Secondary | ICD-10-CM | POA: Diagnosis not present

## 2015-06-03 DIAGNOSIS — M810 Age-related osteoporosis without current pathological fracture: Secondary | ICD-10-CM | POA: Diagnosis not present

## 2015-06-03 DIAGNOSIS — M170 Bilateral primary osteoarthritis of knee: Secondary | ICD-10-CM | POA: Diagnosis not present

## 2015-06-03 DIAGNOSIS — K5790 Diverticulosis of intestine, part unspecified, without perforation or abscess without bleeding: Secondary | ICD-10-CM | POA: Diagnosis not present

## 2015-06-03 DIAGNOSIS — G2581 Restless legs syndrome: Secondary | ICD-10-CM | POA: Diagnosis not present

## 2015-06-03 DIAGNOSIS — I252 Old myocardial infarction: Secondary | ICD-10-CM | POA: Diagnosis not present

## 2015-06-03 DIAGNOSIS — J449 Chronic obstructive pulmonary disease, unspecified: Secondary | ICD-10-CM | POA: Diagnosis not present

## 2015-06-03 DIAGNOSIS — I2510 Atherosclerotic heart disease of native coronary artery without angina pectoris: Secondary | ICD-10-CM | POA: Diagnosis not present

## 2015-06-03 DIAGNOSIS — F419 Anxiety disorder, unspecified: Secondary | ICD-10-CM | POA: Diagnosis not present

## 2015-06-03 DIAGNOSIS — N183 Chronic kidney disease, stage 3 (moderate): Secondary | ICD-10-CM | POA: Diagnosis not present

## 2015-06-03 DIAGNOSIS — G629 Polyneuropathy, unspecified: Secondary | ICD-10-CM | POA: Diagnosis not present

## 2015-06-03 DIAGNOSIS — F322 Major depressive disorder, single episode, severe without psychotic features: Secondary | ICD-10-CM | POA: Diagnosis not present

## 2015-06-03 DIAGNOSIS — I509 Heart failure, unspecified: Secondary | ICD-10-CM | POA: Diagnosis not present

## 2015-06-04 DIAGNOSIS — I2510 Atherosclerotic heart disease of native coronary artery without angina pectoris: Secondary | ICD-10-CM | POA: Diagnosis not present

## 2015-06-04 DIAGNOSIS — M810 Age-related osteoporosis without current pathological fracture: Secondary | ICD-10-CM | POA: Diagnosis not present

## 2015-06-04 DIAGNOSIS — G629 Polyneuropathy, unspecified: Secondary | ICD-10-CM | POA: Diagnosis not present

## 2015-06-04 DIAGNOSIS — F419 Anxiety disorder, unspecified: Secondary | ICD-10-CM | POA: Diagnosis not present

## 2015-06-04 DIAGNOSIS — M170 Bilateral primary osteoarthritis of knee: Secondary | ICD-10-CM | POA: Diagnosis not present

## 2015-06-04 DIAGNOSIS — K5790 Diverticulosis of intestine, part unspecified, without perforation or abscess without bleeding: Secondary | ICD-10-CM | POA: Diagnosis not present

## 2015-06-04 DIAGNOSIS — I130 Hypertensive heart and chronic kidney disease with heart failure and stage 1 through stage 4 chronic kidney disease, or unspecified chronic kidney disease: Secondary | ICD-10-CM | POA: Diagnosis not present

## 2015-06-04 DIAGNOSIS — I252 Old myocardial infarction: Secondary | ICD-10-CM | POA: Diagnosis not present

## 2015-06-04 DIAGNOSIS — G2581 Restless legs syndrome: Secondary | ICD-10-CM | POA: Diagnosis not present

## 2015-06-04 DIAGNOSIS — I509 Heart failure, unspecified: Secondary | ICD-10-CM | POA: Diagnosis not present

## 2015-06-04 DIAGNOSIS — F322 Major depressive disorder, single episode, severe without psychotic features: Secondary | ICD-10-CM | POA: Diagnosis not present

## 2015-06-04 DIAGNOSIS — J449 Chronic obstructive pulmonary disease, unspecified: Secondary | ICD-10-CM | POA: Diagnosis not present

## 2015-06-04 DIAGNOSIS — N183 Chronic kidney disease, stage 3 (moderate): Secondary | ICD-10-CM | POA: Diagnosis not present

## 2015-06-05 ENCOUNTER — Telehealth: Payer: Self-pay

## 2015-06-05 ENCOUNTER — Telehealth: Payer: Self-pay | Admitting: Internal Medicine

## 2015-06-05 DIAGNOSIS — J449 Chronic obstructive pulmonary disease, unspecified: Secondary | ICD-10-CM | POA: Diagnosis not present

## 2015-06-05 DIAGNOSIS — I252 Old myocardial infarction: Secondary | ICD-10-CM | POA: Diagnosis not present

## 2015-06-05 DIAGNOSIS — I509 Heart failure, unspecified: Secondary | ICD-10-CM | POA: Diagnosis not present

## 2015-06-05 DIAGNOSIS — M810 Age-related osteoporosis without current pathological fracture: Secondary | ICD-10-CM | POA: Diagnosis not present

## 2015-06-05 DIAGNOSIS — G2581 Restless legs syndrome: Secondary | ICD-10-CM | POA: Diagnosis not present

## 2015-06-05 DIAGNOSIS — F322 Major depressive disorder, single episode, severe without psychotic features: Secondary | ICD-10-CM | POA: Diagnosis not present

## 2015-06-05 DIAGNOSIS — F419 Anxiety disorder, unspecified: Secondary | ICD-10-CM | POA: Diagnosis not present

## 2015-06-05 DIAGNOSIS — M170 Bilateral primary osteoarthritis of knee: Secondary | ICD-10-CM | POA: Diagnosis not present

## 2015-06-05 DIAGNOSIS — I2510 Atherosclerotic heart disease of native coronary artery without angina pectoris: Secondary | ICD-10-CM | POA: Diagnosis not present

## 2015-06-05 DIAGNOSIS — N183 Chronic kidney disease, stage 3 (moderate): Secondary | ICD-10-CM | POA: Diagnosis not present

## 2015-06-05 DIAGNOSIS — I130 Hypertensive heart and chronic kidney disease with heart failure and stage 1 through stage 4 chronic kidney disease, or unspecified chronic kidney disease: Secondary | ICD-10-CM | POA: Diagnosis not present

## 2015-06-05 DIAGNOSIS — G629 Polyneuropathy, unspecified: Secondary | ICD-10-CM | POA: Diagnosis not present

## 2015-06-05 DIAGNOSIS — K5790 Diverticulosis of intestine, part unspecified, without perforation or abscess without bleeding: Secondary | ICD-10-CM | POA: Diagnosis not present

## 2015-06-05 NOTE — Telephone Encounter (Signed)
[place] for verbal order to continue home health

## 2015-06-05 NOTE — Telephone Encounter (Signed)
Merry Proud nurse with [facility] left v/m requesting verbal orders for home health aide to assist pt with bath 2 x a week next week.

## 2015-06-05 NOTE — Telephone Encounter (Signed)
Haley Harris carter with [facility] called to get a verbal order for PT  increase her aid to help with bathing for 1 more week cb number  [phone]

## 2015-06-07 DIAGNOSIS — K5790 Diverticulosis of intestine, part unspecified, without perforation or abscess without bleeding: Secondary | ICD-10-CM | POA: Diagnosis not present

## 2015-06-07 DIAGNOSIS — I130 Hypertensive heart and chronic kidney disease with heart failure and stage 1 through stage 4 chronic kidney disease, or unspecified chronic kidney disease: Secondary | ICD-10-CM | POA: Diagnosis not present

## 2015-06-07 DIAGNOSIS — G629 Polyneuropathy, unspecified: Secondary | ICD-10-CM | POA: Diagnosis not present

## 2015-06-07 DIAGNOSIS — I252 Old myocardial infarction: Secondary | ICD-10-CM | POA: Diagnosis not present

## 2015-06-07 DIAGNOSIS — N183 Chronic kidney disease, stage 3 (moderate): Secondary | ICD-10-CM | POA: Diagnosis not present

## 2015-06-07 DIAGNOSIS — I509 Heart failure, unspecified: Secondary | ICD-10-CM | POA: Diagnosis not present

## 2015-06-07 DIAGNOSIS — F419 Anxiety disorder, unspecified: Secondary | ICD-10-CM | POA: Diagnosis not present

## 2015-06-07 DIAGNOSIS — M170 Bilateral primary osteoarthritis of knee: Secondary | ICD-10-CM | POA: Diagnosis not present

## 2015-06-07 DIAGNOSIS — G2581 Restless legs syndrome: Secondary | ICD-10-CM | POA: Diagnosis not present

## 2015-06-07 DIAGNOSIS — J449 Chronic obstructive pulmonary disease, unspecified: Secondary | ICD-10-CM | POA: Diagnosis not present

## 2015-06-07 DIAGNOSIS — M810 Age-related osteoporosis without current pathological fracture: Secondary | ICD-10-CM | POA: Diagnosis not present

## 2015-06-07 DIAGNOSIS — I2510 Atherosclerotic heart disease of native coronary artery without angina pectoris: Secondary | ICD-10-CM | POA: Diagnosis not present

## 2015-06-07 DIAGNOSIS — F322 Major depressive disorder, single episode, severe without psychotic features: Secondary | ICD-10-CM | POA: Diagnosis not present

## 2015-06-08 NOTE — Telephone Encounter (Signed)
okay

## 2015-06-10 DIAGNOSIS — G629 Polyneuropathy, unspecified: Secondary | ICD-10-CM | POA: Diagnosis not present

## 2015-06-10 DIAGNOSIS — K5790 Diverticulosis of intestine, part unspecified, without perforation or abscess without bleeding: Secondary | ICD-10-CM | POA: Diagnosis not present

## 2015-06-10 DIAGNOSIS — I130 Hypertensive heart and chronic kidney disease with heart failure and stage 1 through stage 4 chronic kidney disease, or unspecified chronic kidney disease: Secondary | ICD-10-CM | POA: Diagnosis not present

## 2015-06-10 DIAGNOSIS — M170 Bilateral primary osteoarthritis of knee: Secondary | ICD-10-CM | POA: Diagnosis not present

## 2015-06-10 DIAGNOSIS — M810 Age-related osteoporosis without current pathological fracture: Secondary | ICD-10-CM | POA: Diagnosis not present

## 2015-06-10 DIAGNOSIS — I252 Old myocardial infarction: Secondary | ICD-10-CM | POA: Diagnosis not present

## 2015-06-10 DIAGNOSIS — I2510 Atherosclerotic heart disease of native coronary artery without angina pectoris: Secondary | ICD-10-CM | POA: Diagnosis not present

## 2015-06-10 DIAGNOSIS — G2581 Restless legs syndrome: Secondary | ICD-10-CM | POA: Diagnosis not present

## 2015-06-10 DIAGNOSIS — I509 Heart failure, unspecified: Secondary | ICD-10-CM | POA: Diagnosis not present

## 2015-06-10 DIAGNOSIS — F419 Anxiety disorder, unspecified: Secondary | ICD-10-CM | POA: Diagnosis not present

## 2015-06-10 DIAGNOSIS — J449 Chronic obstructive pulmonary disease, unspecified: Secondary | ICD-10-CM | POA: Diagnosis not present

## 2015-06-10 DIAGNOSIS — F322 Major depressive disorder, single episode, severe without psychotic features: Secondary | ICD-10-CM | POA: Diagnosis not present

## 2015-06-10 DIAGNOSIS — N183 Chronic kidney disease, stage 3 (moderate): Secondary | ICD-10-CM | POA: Diagnosis not present

## 2015-06-10 NOTE — Telephone Encounter (Signed)
Claiborne Billings called back about order.  I let her know Dr.Letvak said okay.

## 2015-06-11 DIAGNOSIS — I2510 Atherosclerotic heart disease of native coronary artery without angina pectoris: Secondary | ICD-10-CM | POA: Diagnosis not present

## 2015-06-11 DIAGNOSIS — I509 Heart failure, unspecified: Secondary | ICD-10-CM | POA: Diagnosis not present

## 2015-06-11 DIAGNOSIS — F322 Major depressive disorder, single episode, severe without psychotic features: Secondary | ICD-10-CM | POA: Diagnosis not present

## 2015-06-11 DIAGNOSIS — K5790 Diverticulosis of intestine, part unspecified, without perforation or abscess without bleeding: Secondary | ICD-10-CM | POA: Diagnosis not present

## 2015-06-11 DIAGNOSIS — N183 Chronic kidney disease, stage 3 (moderate): Secondary | ICD-10-CM | POA: Diagnosis not present

## 2015-06-11 DIAGNOSIS — G2581 Restless legs syndrome: Secondary | ICD-10-CM | POA: Diagnosis not present

## 2015-06-11 DIAGNOSIS — M810 Age-related osteoporosis without current pathological fracture: Secondary | ICD-10-CM | POA: Diagnosis not present

## 2015-06-11 DIAGNOSIS — J449 Chronic obstructive pulmonary disease, unspecified: Secondary | ICD-10-CM | POA: Diagnosis not present

## 2015-06-11 DIAGNOSIS — F419 Anxiety disorder, unspecified: Secondary | ICD-10-CM | POA: Diagnosis not present

## 2015-06-11 DIAGNOSIS — M170 Bilateral primary osteoarthritis of knee: Secondary | ICD-10-CM | POA: Diagnosis not present

## 2015-06-11 DIAGNOSIS — I130 Hypertensive heart and chronic kidney disease with heart failure and stage 1 through stage 4 chronic kidney disease, or unspecified chronic kidney disease: Secondary | ICD-10-CM | POA: Diagnosis not present

## 2015-06-11 DIAGNOSIS — I252 Old myocardial infarction: Secondary | ICD-10-CM | POA: Diagnosis not present

## 2015-06-11 DIAGNOSIS — G629 Polyneuropathy, unspecified: Secondary | ICD-10-CM | POA: Diagnosis not present

## 2015-06-12 DIAGNOSIS — M170 Bilateral primary osteoarthritis of knee: Secondary | ICD-10-CM | POA: Diagnosis not present

## 2015-06-12 DIAGNOSIS — M810 Age-related osteoporosis without current pathological fracture: Secondary | ICD-10-CM | POA: Diagnosis not present

## 2015-06-12 DIAGNOSIS — F419 Anxiety disorder, unspecified: Secondary | ICD-10-CM | POA: Diagnosis not present

## 2015-06-12 DIAGNOSIS — G629 Polyneuropathy, unspecified: Secondary | ICD-10-CM | POA: Diagnosis not present

## 2015-06-12 DIAGNOSIS — J449 Chronic obstructive pulmonary disease, unspecified: Secondary | ICD-10-CM | POA: Diagnosis not present

## 2015-06-12 DIAGNOSIS — I509 Heart failure, unspecified: Secondary | ICD-10-CM | POA: Diagnosis not present

## 2015-06-12 DIAGNOSIS — F322 Major depressive disorder, single episode, severe without psychotic features: Secondary | ICD-10-CM | POA: Diagnosis not present

## 2015-06-12 DIAGNOSIS — K5790 Diverticulosis of intestine, part unspecified, without perforation or abscess without bleeding: Secondary | ICD-10-CM | POA: Diagnosis not present

## 2015-06-12 DIAGNOSIS — N183 Chronic kidney disease, stage 3 (moderate): Secondary | ICD-10-CM | POA: Diagnosis not present

## 2015-06-12 DIAGNOSIS — I2510 Atherosclerotic heart disease of native coronary artery without angina pectoris: Secondary | ICD-10-CM | POA: Diagnosis not present

## 2015-06-12 DIAGNOSIS — G2581 Restless legs syndrome: Secondary | ICD-10-CM | POA: Diagnosis not present

## 2015-06-12 DIAGNOSIS — I130 Hypertensive heart and chronic kidney disease with heart failure and stage 1 through stage 4 chronic kidney disease, or unspecified chronic kidney disease: Secondary | ICD-10-CM | POA: Diagnosis not present

## 2015-06-12 DIAGNOSIS — I252 Old myocardial infarction: Secondary | ICD-10-CM | POA: Diagnosis not present

## 2015-06-13 DIAGNOSIS — M810 Age-related osteoporosis without current pathological fracture: Secondary | ICD-10-CM | POA: Diagnosis not present

## 2015-06-13 DIAGNOSIS — M170 Bilateral primary osteoarthritis of knee: Secondary | ICD-10-CM | POA: Diagnosis not present

## 2015-06-13 DIAGNOSIS — I130 Hypertensive heart and chronic kidney disease with heart failure and stage 1 through stage 4 chronic kidney disease, or unspecified chronic kidney disease: Secondary | ICD-10-CM | POA: Diagnosis not present

## 2015-06-13 DIAGNOSIS — I509 Heart failure, unspecified: Secondary | ICD-10-CM | POA: Diagnosis not present

## 2015-06-13 DIAGNOSIS — K5790 Diverticulosis of intestine, part unspecified, without perforation or abscess without bleeding: Secondary | ICD-10-CM | POA: Diagnosis not present

## 2015-06-13 DIAGNOSIS — I252 Old myocardial infarction: Secondary | ICD-10-CM | POA: Diagnosis not present

## 2015-06-13 DIAGNOSIS — I2510 Atherosclerotic heart disease of native coronary artery without angina pectoris: Secondary | ICD-10-CM | POA: Diagnosis not present

## 2015-06-13 DIAGNOSIS — J449 Chronic obstructive pulmonary disease, unspecified: Secondary | ICD-10-CM | POA: Diagnosis not present

## 2015-06-13 DIAGNOSIS — F419 Anxiety disorder, unspecified: Secondary | ICD-10-CM | POA: Diagnosis not present

## 2015-06-13 DIAGNOSIS — N183 Chronic kidney disease, stage 3 (moderate): Secondary | ICD-10-CM | POA: Diagnosis not present

## 2015-06-13 DIAGNOSIS — F322 Major depressive disorder, single episode, severe without psychotic features: Secondary | ICD-10-CM | POA: Diagnosis not present

## 2015-06-13 DIAGNOSIS — G2581 Restless legs syndrome: Secondary | ICD-10-CM | POA: Diagnosis not present

## 2015-06-13 DIAGNOSIS — G629 Polyneuropathy, unspecified: Secondary | ICD-10-CM | POA: Diagnosis not present

## 2015-06-17 DIAGNOSIS — N183 Chronic kidney disease, stage 3 (moderate): Secondary | ICD-10-CM | POA: Diagnosis not present

## 2015-06-17 DIAGNOSIS — Z9181 History of falling: Secondary | ICD-10-CM | POA: Diagnosis not present

## 2015-06-17 DIAGNOSIS — J449 Chronic obstructive pulmonary disease, unspecified: Secondary | ICD-10-CM | POA: Diagnosis not present

## 2015-06-17 DIAGNOSIS — I2510 Atherosclerotic heart disease of native coronary artery without angina pectoris: Secondary | ICD-10-CM | POA: Diagnosis not present

## 2015-06-17 DIAGNOSIS — M170 Bilateral primary osteoarthritis of knee: Secondary | ICD-10-CM | POA: Diagnosis not present

## 2015-06-17 DIAGNOSIS — I130 Hypertensive heart and chronic kidney disease with heart failure and stage 1 through stage 4 chronic kidney disease, or unspecified chronic kidney disease: Secondary | ICD-10-CM | POA: Diagnosis not present

## 2015-06-17 DIAGNOSIS — G629 Polyneuropathy, unspecified: Secondary | ICD-10-CM | POA: Diagnosis not present

## 2015-06-17 DIAGNOSIS — Z79891 Long term (current) use of opiate analgesic: Secondary | ICD-10-CM | POA: Diagnosis not present

## 2015-06-17 DIAGNOSIS — I509 Heart failure, unspecified: Secondary | ICD-10-CM | POA: Diagnosis not present

## 2015-06-18 DIAGNOSIS — I509 Heart failure, unspecified: Secondary | ICD-10-CM | POA: Diagnosis not present

## 2015-06-18 DIAGNOSIS — M170 Bilateral primary osteoarthritis of knee: Secondary | ICD-10-CM | POA: Diagnosis not present

## 2015-06-18 DIAGNOSIS — J449 Chronic obstructive pulmonary disease, unspecified: Secondary | ICD-10-CM | POA: Diagnosis not present

## 2015-06-18 DIAGNOSIS — Z9181 History of falling: Secondary | ICD-10-CM | POA: Diagnosis not present

## 2015-06-18 DIAGNOSIS — N183 Chronic kidney disease, stage 3 (moderate): Secondary | ICD-10-CM | POA: Diagnosis not present

## 2015-06-18 DIAGNOSIS — Z79891 Long term (current) use of opiate analgesic: Secondary | ICD-10-CM | POA: Diagnosis not present

## 2015-06-18 DIAGNOSIS — I2510 Atherosclerotic heart disease of native coronary artery without angina pectoris: Secondary | ICD-10-CM | POA: Diagnosis not present

## 2015-06-18 DIAGNOSIS — G629 Polyneuropathy, unspecified: Secondary | ICD-10-CM | POA: Diagnosis not present

## 2015-06-18 DIAGNOSIS — I130 Hypertensive heart and chronic kidney disease with heart failure and stage 1 through stage 4 chronic kidney disease, or unspecified chronic kidney disease: Secondary | ICD-10-CM | POA: Diagnosis not present

## 2015-06-20 DIAGNOSIS — M170 Bilateral primary osteoarthritis of knee: Secondary | ICD-10-CM | POA: Diagnosis not present

## 2015-06-20 DIAGNOSIS — I2510 Atherosclerotic heart disease of native coronary artery without angina pectoris: Secondary | ICD-10-CM | POA: Diagnosis not present

## 2015-06-20 DIAGNOSIS — J449 Chronic obstructive pulmonary disease, unspecified: Secondary | ICD-10-CM | POA: Diagnosis not present

## 2015-06-20 DIAGNOSIS — I509 Heart failure, unspecified: Secondary | ICD-10-CM | POA: Diagnosis not present

## 2015-06-20 DIAGNOSIS — N183 Chronic kidney disease, stage 3 (moderate): Secondary | ICD-10-CM | POA: Diagnosis not present

## 2015-06-20 DIAGNOSIS — G629 Polyneuropathy, unspecified: Secondary | ICD-10-CM | POA: Diagnosis not present

## 2015-06-20 DIAGNOSIS — I130 Hypertensive heart and chronic kidney disease with heart failure and stage 1 through stage 4 chronic kidney disease, or unspecified chronic kidney disease: Secondary | ICD-10-CM | POA: Diagnosis not present

## 2015-06-20 DIAGNOSIS — Z79891 Long term (current) use of opiate analgesic: Secondary | ICD-10-CM | POA: Diagnosis not present

## 2015-06-20 DIAGNOSIS — Z9181 History of falling: Secondary | ICD-10-CM | POA: Diagnosis not present

## 2015-06-25 DIAGNOSIS — J449 Chronic obstructive pulmonary disease, unspecified: Secondary | ICD-10-CM | POA: Diagnosis not present

## 2015-06-25 DIAGNOSIS — I130 Hypertensive heart and chronic kidney disease with heart failure and stage 1 through stage 4 chronic kidney disease, or unspecified chronic kidney disease: Secondary | ICD-10-CM | POA: Diagnosis not present

## 2015-06-25 DIAGNOSIS — Z79891 Long term (current) use of opiate analgesic: Secondary | ICD-10-CM | POA: Diagnosis not present

## 2015-06-25 DIAGNOSIS — Z9181 History of falling: Secondary | ICD-10-CM | POA: Diagnosis not present

## 2015-06-25 DIAGNOSIS — G629 Polyneuropathy, unspecified: Secondary | ICD-10-CM | POA: Diagnosis not present

## 2015-06-25 DIAGNOSIS — N183 Chronic kidney disease, stage 3 (moderate): Secondary | ICD-10-CM | POA: Diagnosis not present

## 2015-06-25 DIAGNOSIS — I509 Heart failure, unspecified: Secondary | ICD-10-CM | POA: Diagnosis not present

## 2015-06-25 DIAGNOSIS — M170 Bilateral primary osteoarthritis of knee: Secondary | ICD-10-CM | POA: Diagnosis not present

## 2015-06-25 DIAGNOSIS — I2510 Atherosclerotic heart disease of native coronary artery without angina pectoris: Secondary | ICD-10-CM | POA: Diagnosis not present

## 2015-06-26 ENCOUNTER — Ambulatory Visit (INDEPENDENT_AMBULATORY_CARE_PROVIDER_SITE_OTHER): Payer: Medicare Other | Admitting: Podiatry

## 2015-06-26 ENCOUNTER — Encounter: Payer: Self-pay | Admitting: Podiatry

## 2015-06-26 DIAGNOSIS — M79676 Pain in unspecified toe(s): Secondary | ICD-10-CM | POA: Diagnosis not present

## 2015-06-26 DIAGNOSIS — B351 Tinea unguium: Secondary | ICD-10-CM | POA: Diagnosis not present

## 2015-06-26 NOTE — Progress Notes (Signed)
She presents today with a chief complaint of painful elongated toenails on through 5 bilateral. She states that she can no longer cut them and they're painful.  Objective: Vital signs are stable she is alert and oriented 3. Pulses are palpable. She has severe onychomycosis with thick yellow dystrophic with mycotic nails with subungual debris.  Assessment: Pain and limp secondary to onychomycosis bilateral.  Plan: Debridement of toenails 1 through 5 bilateral covered service secondary to pain.

## 2015-06-27 DIAGNOSIS — M170 Bilateral primary osteoarthritis of knee: Secondary | ICD-10-CM | POA: Diagnosis not present

## 2015-06-27 DIAGNOSIS — I130 Hypertensive heart and chronic kidney disease with heart failure and stage 1 through stage 4 chronic kidney disease, or unspecified chronic kidney disease: Secondary | ICD-10-CM | POA: Diagnosis not present

## 2015-06-27 DIAGNOSIS — G629 Polyneuropathy, unspecified: Secondary | ICD-10-CM | POA: Diagnosis not present

## 2015-06-27 DIAGNOSIS — N183 Chronic kidney disease, stage 3 (moderate): Secondary | ICD-10-CM | POA: Diagnosis not present

## 2015-06-27 DIAGNOSIS — I2510 Atherosclerotic heart disease of native coronary artery without angina pectoris: Secondary | ICD-10-CM | POA: Diagnosis not present

## 2015-06-27 DIAGNOSIS — Z79891 Long term (current) use of opiate analgesic: Secondary | ICD-10-CM | POA: Diagnosis not present

## 2015-06-27 DIAGNOSIS — Z9181 History of falling: Secondary | ICD-10-CM | POA: Diagnosis not present

## 2015-06-27 DIAGNOSIS — J449 Chronic obstructive pulmonary disease, unspecified: Secondary | ICD-10-CM | POA: Diagnosis not present

## 2015-06-27 DIAGNOSIS — I509 Heart failure, unspecified: Secondary | ICD-10-CM | POA: Diagnosis not present

## 2015-07-01 DIAGNOSIS — Z9181 History of falling: Secondary | ICD-10-CM | POA: Diagnosis not present

## 2015-07-01 DIAGNOSIS — J449 Chronic obstructive pulmonary disease, unspecified: Secondary | ICD-10-CM | POA: Diagnosis not present

## 2015-07-01 DIAGNOSIS — M170 Bilateral primary osteoarthritis of knee: Secondary | ICD-10-CM | POA: Diagnosis not present

## 2015-07-01 DIAGNOSIS — I2510 Atherosclerotic heart disease of native coronary artery without angina pectoris: Secondary | ICD-10-CM | POA: Diagnosis not present

## 2015-07-01 DIAGNOSIS — Z79891 Long term (current) use of opiate analgesic: Secondary | ICD-10-CM | POA: Diagnosis not present

## 2015-07-01 DIAGNOSIS — N183 Chronic kidney disease, stage 3 (moderate): Secondary | ICD-10-CM | POA: Diagnosis not present

## 2015-07-01 DIAGNOSIS — G629 Polyneuropathy, unspecified: Secondary | ICD-10-CM | POA: Diagnosis not present

## 2015-07-01 DIAGNOSIS — I130 Hypertensive heart and chronic kidney disease with heart failure and stage 1 through stage 4 chronic kidney disease, or unspecified chronic kidney disease: Secondary | ICD-10-CM | POA: Diagnosis not present

## 2015-07-01 DIAGNOSIS — I509 Heart failure, unspecified: Secondary | ICD-10-CM | POA: Diagnosis not present

## 2015-07-02 DIAGNOSIS — M170 Bilateral primary osteoarthritis of knee: Secondary | ICD-10-CM | POA: Diagnosis not present

## 2015-07-02 DIAGNOSIS — Z9181 History of falling: Secondary | ICD-10-CM | POA: Diagnosis not present

## 2015-07-02 DIAGNOSIS — G629 Polyneuropathy, unspecified: Secondary | ICD-10-CM | POA: Diagnosis not present

## 2015-07-02 DIAGNOSIS — N183 Chronic kidney disease, stage 3 (moderate): Secondary | ICD-10-CM | POA: Diagnosis not present

## 2015-07-02 DIAGNOSIS — Z79891 Long term (current) use of opiate analgesic: Secondary | ICD-10-CM | POA: Diagnosis not present

## 2015-07-02 DIAGNOSIS — I509 Heart failure, unspecified: Secondary | ICD-10-CM | POA: Diagnosis not present

## 2015-07-02 DIAGNOSIS — I130 Hypertensive heart and chronic kidney disease with heart failure and stage 1 through stage 4 chronic kidney disease, or unspecified chronic kidney disease: Secondary | ICD-10-CM | POA: Diagnosis not present

## 2015-07-02 DIAGNOSIS — I2510 Atherosclerotic heart disease of native coronary artery without angina pectoris: Secondary | ICD-10-CM | POA: Diagnosis not present

## 2015-07-02 DIAGNOSIS — J449 Chronic obstructive pulmonary disease, unspecified: Secondary | ICD-10-CM | POA: Diagnosis not present

## 2015-07-04 DIAGNOSIS — M170 Bilateral primary osteoarthritis of knee: Secondary | ICD-10-CM | POA: Diagnosis not present

## 2015-07-04 DIAGNOSIS — Z9181 History of falling: Secondary | ICD-10-CM | POA: Diagnosis not present

## 2015-07-04 DIAGNOSIS — I509 Heart failure, unspecified: Secondary | ICD-10-CM | POA: Diagnosis not present

## 2015-07-04 DIAGNOSIS — I130 Hypertensive heart and chronic kidney disease with heart failure and stage 1 through stage 4 chronic kidney disease, or unspecified chronic kidney disease: Secondary | ICD-10-CM | POA: Diagnosis not present

## 2015-07-04 DIAGNOSIS — Z79891 Long term (current) use of opiate analgesic: Secondary | ICD-10-CM | POA: Diagnosis not present

## 2015-07-04 DIAGNOSIS — G629 Polyneuropathy, unspecified: Secondary | ICD-10-CM | POA: Diagnosis not present

## 2015-07-04 DIAGNOSIS — I2510 Atherosclerotic heart disease of native coronary artery without angina pectoris: Secondary | ICD-10-CM | POA: Diagnosis not present

## 2015-07-04 DIAGNOSIS — J449 Chronic obstructive pulmonary disease, unspecified: Secondary | ICD-10-CM | POA: Diagnosis not present

## 2015-07-04 DIAGNOSIS — N183 Chronic kidney disease, stage 3 (moderate): Secondary | ICD-10-CM | POA: Diagnosis not present

## 2015-07-09 DIAGNOSIS — G629 Polyneuropathy, unspecified: Secondary | ICD-10-CM | POA: Diagnosis not present

## 2015-07-09 DIAGNOSIS — N183 Chronic kidney disease, stage 3 (moderate): Secondary | ICD-10-CM | POA: Diagnosis not present

## 2015-07-09 DIAGNOSIS — J449 Chronic obstructive pulmonary disease, unspecified: Secondary | ICD-10-CM | POA: Diagnosis not present

## 2015-07-09 DIAGNOSIS — Z9181 History of falling: Secondary | ICD-10-CM | POA: Diagnosis not present

## 2015-07-09 DIAGNOSIS — M170 Bilateral primary osteoarthritis of knee: Secondary | ICD-10-CM | POA: Diagnosis not present

## 2015-07-09 DIAGNOSIS — Z79891 Long term (current) use of opiate analgesic: Secondary | ICD-10-CM | POA: Diagnosis not present

## 2015-07-09 DIAGNOSIS — I2510 Atherosclerotic heart disease of native coronary artery without angina pectoris: Secondary | ICD-10-CM | POA: Diagnosis not present

## 2015-07-09 DIAGNOSIS — I130 Hypertensive heart and chronic kidney disease with heart failure and stage 1 through stage 4 chronic kidney disease, or unspecified chronic kidney disease: Secondary | ICD-10-CM | POA: Diagnosis not present

## 2015-07-09 DIAGNOSIS — I509 Heart failure, unspecified: Secondary | ICD-10-CM | POA: Diagnosis not present

## 2015-07-10 DIAGNOSIS — I130 Hypertensive heart and chronic kidney disease with heart failure and stage 1 through stage 4 chronic kidney disease, or unspecified chronic kidney disease: Secondary | ICD-10-CM | POA: Diagnosis not present

## 2015-07-10 DIAGNOSIS — N183 Chronic kidney disease, stage 3 (moderate): Secondary | ICD-10-CM | POA: Diagnosis not present

## 2015-07-10 DIAGNOSIS — Z79891 Long term (current) use of opiate analgesic: Secondary | ICD-10-CM | POA: Diagnosis not present

## 2015-07-10 DIAGNOSIS — I2510 Atherosclerotic heart disease of native coronary artery without angina pectoris: Secondary | ICD-10-CM | POA: Diagnosis not present

## 2015-07-10 DIAGNOSIS — J449 Chronic obstructive pulmonary disease, unspecified: Secondary | ICD-10-CM | POA: Diagnosis not present

## 2015-07-10 DIAGNOSIS — M170 Bilateral primary osteoarthritis of knee: Secondary | ICD-10-CM | POA: Diagnosis not present

## 2015-07-10 DIAGNOSIS — I509 Heart failure, unspecified: Secondary | ICD-10-CM | POA: Diagnosis not present

## 2015-07-10 DIAGNOSIS — G629 Polyneuropathy, unspecified: Secondary | ICD-10-CM | POA: Diagnosis not present

## 2015-07-10 DIAGNOSIS — Z9181 History of falling: Secondary | ICD-10-CM | POA: Diagnosis not present

## 2015-07-11 DIAGNOSIS — Z79891 Long term (current) use of opiate analgesic: Secondary | ICD-10-CM | POA: Diagnosis not present

## 2015-07-11 DIAGNOSIS — I130 Hypertensive heart and chronic kidney disease with heart failure and stage 1 through stage 4 chronic kidney disease, or unspecified chronic kidney disease: Secondary | ICD-10-CM | POA: Diagnosis not present

## 2015-07-11 DIAGNOSIS — Z9181 History of falling: Secondary | ICD-10-CM | POA: Diagnosis not present

## 2015-07-11 DIAGNOSIS — N183 Chronic kidney disease, stage 3 (moderate): Secondary | ICD-10-CM | POA: Diagnosis not present

## 2015-07-11 DIAGNOSIS — I2510 Atherosclerotic heart disease of native coronary artery without angina pectoris: Secondary | ICD-10-CM | POA: Diagnosis not present

## 2015-07-11 DIAGNOSIS — J449 Chronic obstructive pulmonary disease, unspecified: Secondary | ICD-10-CM | POA: Diagnosis not present

## 2015-07-11 DIAGNOSIS — M170 Bilateral primary osteoarthritis of knee: Secondary | ICD-10-CM | POA: Diagnosis not present

## 2015-07-11 DIAGNOSIS — I509 Heart failure, unspecified: Secondary | ICD-10-CM | POA: Diagnosis not present

## 2015-07-11 DIAGNOSIS — G629 Polyneuropathy, unspecified: Secondary | ICD-10-CM | POA: Diagnosis not present

## 2015-07-16 DIAGNOSIS — I130 Hypertensive heart and chronic kidney disease with heart failure and stage 1 through stage 4 chronic kidney disease, or unspecified chronic kidney disease: Secondary | ICD-10-CM | POA: Diagnosis not present

## 2015-07-16 DIAGNOSIS — I509 Heart failure, unspecified: Secondary | ICD-10-CM | POA: Diagnosis not present

## 2015-07-16 DIAGNOSIS — M170 Bilateral primary osteoarthritis of knee: Secondary | ICD-10-CM | POA: Diagnosis not present

## 2015-07-16 DIAGNOSIS — J449 Chronic obstructive pulmonary disease, unspecified: Secondary | ICD-10-CM | POA: Diagnosis not present

## 2015-07-17 DIAGNOSIS — J449 Chronic obstructive pulmonary disease, unspecified: Secondary | ICD-10-CM | POA: Diagnosis not present

## 2015-07-17 DIAGNOSIS — M170 Bilateral primary osteoarthritis of knee: Secondary | ICD-10-CM | POA: Diagnosis not present

## 2015-07-17 DIAGNOSIS — I130 Hypertensive heart and chronic kidney disease with heart failure and stage 1 through stage 4 chronic kidney disease, or unspecified chronic kidney disease: Secondary | ICD-10-CM | POA: Diagnosis not present

## 2015-07-17 DIAGNOSIS — I509 Heart failure, unspecified: Secondary | ICD-10-CM | POA: Diagnosis not present

## 2015-07-18 DIAGNOSIS — J449 Chronic obstructive pulmonary disease, unspecified: Secondary | ICD-10-CM | POA: Diagnosis not present

## 2015-07-18 DIAGNOSIS — I509 Heart failure, unspecified: Secondary | ICD-10-CM | POA: Diagnosis not present

## 2015-07-18 DIAGNOSIS — I130 Hypertensive heart and chronic kidney disease with heart failure and stage 1 through stage 4 chronic kidney disease, or unspecified chronic kidney disease: Secondary | ICD-10-CM | POA: Diagnosis not present

## 2015-07-18 DIAGNOSIS — M170 Bilateral primary osteoarthritis of knee: Secondary | ICD-10-CM | POA: Diagnosis not present

## 2015-07-23 DIAGNOSIS — J449 Chronic obstructive pulmonary disease, unspecified: Secondary | ICD-10-CM | POA: Diagnosis not present

## 2015-07-23 DIAGNOSIS — M170 Bilateral primary osteoarthritis of knee: Secondary | ICD-10-CM | POA: Diagnosis not present

## 2015-07-23 DIAGNOSIS — I130 Hypertensive heart and chronic kidney disease with heart failure and stage 1 through stage 4 chronic kidney disease, or unspecified chronic kidney disease: Secondary | ICD-10-CM | POA: Diagnosis not present

## 2015-07-23 DIAGNOSIS — I509 Heart failure, unspecified: Secondary | ICD-10-CM | POA: Diagnosis not present

## 2015-07-25 DIAGNOSIS — I130 Hypertensive heart and chronic kidney disease with heart failure and stage 1 through stage 4 chronic kidney disease, or unspecified chronic kidney disease: Secondary | ICD-10-CM | POA: Diagnosis not present

## 2015-07-25 DIAGNOSIS — I509 Heart failure, unspecified: Secondary | ICD-10-CM | POA: Diagnosis not present

## 2015-07-25 DIAGNOSIS — M170 Bilateral primary osteoarthritis of knee: Secondary | ICD-10-CM | POA: Diagnosis not present

## 2015-07-25 DIAGNOSIS — J449 Chronic obstructive pulmonary disease, unspecified: Secondary | ICD-10-CM | POA: Diagnosis not present

## 2015-07-26 DIAGNOSIS — J449 Chronic obstructive pulmonary disease, unspecified: Secondary | ICD-10-CM | POA: Diagnosis not present

## 2015-07-26 DIAGNOSIS — I130 Hypertensive heart and chronic kidney disease with heart failure and stage 1 through stage 4 chronic kidney disease, or unspecified chronic kidney disease: Secondary | ICD-10-CM | POA: Diagnosis not present

## 2015-07-26 DIAGNOSIS — H903 Sensorineural hearing loss, bilateral: Secondary | ICD-10-CM | POA: Diagnosis not present

## 2015-07-26 DIAGNOSIS — I509 Heart failure, unspecified: Secondary | ICD-10-CM | POA: Diagnosis not present

## 2015-07-26 DIAGNOSIS — M170 Bilateral primary osteoarthritis of knee: Secondary | ICD-10-CM | POA: Diagnosis not present

## 2015-08-16 DIAGNOSIS — I252 Old myocardial infarction: Secondary | ICD-10-CM | POA: Diagnosis not present

## 2015-08-16 DIAGNOSIS — I2510 Atherosclerotic heart disease of native coronary artery without angina pectoris: Secondary | ICD-10-CM | POA: Diagnosis not present

## 2015-08-16 DIAGNOSIS — D649 Anemia, unspecified: Secondary | ICD-10-CM | POA: Diagnosis not present

## 2015-08-16 DIAGNOSIS — I119 Hypertensive heart disease without heart failure: Secondary | ICD-10-CM | POA: Diagnosis not present

## 2015-08-16 DIAGNOSIS — E785 Hyperlipidemia, unspecified: Secondary | ICD-10-CM | POA: Diagnosis not present

## 2015-08-26 ENCOUNTER — Other Ambulatory Visit: Payer: Self-pay | Admitting: Internal Medicine

## 2015-08-30 DIAGNOSIS — H1013 Acute atopic conjunctivitis, bilateral: Secondary | ICD-10-CM | POA: Diagnosis not present

## 2015-09-12 DIAGNOSIS — M25552 Pain in left hip: Secondary | ICD-10-CM | POA: Diagnosis not present

## 2015-09-12 DIAGNOSIS — M1711 Unilateral primary osteoarthritis, right knee: Secondary | ICD-10-CM | POA: Diagnosis not present

## 2015-09-13 ENCOUNTER — Other Ambulatory Visit: Payer: Self-pay

## 2015-09-13 MED ORDER — CARVEDILOL 12.5 MG PO TABS: 12.5000 mg | ORAL_TABLET | Freq: Two times a day (BID) | ORAL | Status: DC

## 2015-09-13 MED ORDER — MONTELUKAST SODIUM 10 MG PO TABS: ORAL_TABLET | ORAL | Status: DC

## 2015-09-13 MED ORDER — FAMOTIDINE 20 MG PO TABS: 20.0000 mg | ORAL_TABLET | Freq: Two times a day (BID) | ORAL | Status: DC

## 2015-09-13 MED ORDER — LOSARTAN POTASSIUM 50 MG PO TABS: ORAL_TABLET | ORAL | Status: DC

## 2015-09-13 MED ORDER — FLUTICASONE PROPIONATE 50 MCG/ACT NA SUSP: 2.0000 | Freq: Every day | NASAL | Status: DC

## 2015-09-13 NOTE — Telephone Encounter (Signed)
Pt request refill carvedilol, famotidine, fluticasone,losartan and montelukast to [facility]. Pt last annual 04/12/15 and has appt scheduled 10/03/15. Pt advised refill done per protocol.

## 2015-09-24 ENCOUNTER — Telehealth: Payer: Self-pay | Admitting: Internal Medicine

## 2015-09-24 ENCOUNTER — Emergency Department (HOSPITAL_COMMUNITY)
Admission: EM | Admit: 2015-09-24 | Discharge: 2015-09-25 | Disposition: A | Discharge status: Home / Self Care | Payer: Medicare Other | Source: Home / Self Care | Attending: Emergency Medicine | Admitting: Emergency Medicine

## 2015-09-24 ENCOUNTER — Emergency Department (HOSPITAL_COMMUNITY): Payer: Medicare Other

## 2015-09-24 ENCOUNTER — Encounter (HOSPITAL_COMMUNITY): Payer: Self-pay | Admitting: Emergency Medicine

## 2015-09-24 DIAGNOSIS — J309 Allergic rhinitis, unspecified: Secondary | ICD-10-CM | POA: Diagnosis present

## 2015-09-24 DIAGNOSIS — N179 Acute kidney failure, unspecified: Secondary | ICD-10-CM | POA: Diagnosis present

## 2015-09-24 DIAGNOSIS — Z880 Allergy status to penicillin: Secondary | ICD-10-CM

## 2015-09-24 DIAGNOSIS — E871 Hypo-osmolality and hyponatremia: Secondary | ICD-10-CM | POA: Diagnosis present

## 2015-09-24 DIAGNOSIS — I10 Essential (primary) hypertension: Secondary | ICD-10-CM | POA: Insufficient documentation

## 2015-09-24 DIAGNOSIS — J101 Influenza due to other identified influenza virus with other respiratory manifestations: Principal | ICD-10-CM | POA: Diagnosis present

## 2015-09-24 DIAGNOSIS — Z7951 Long term (current) use of inhaled steroids: Secondary | ICD-10-CM | POA: Insufficient documentation

## 2015-09-24 DIAGNOSIS — E279 Disorder of adrenal gland, unspecified: Secondary | ICD-10-CM | POA: Diagnosis present

## 2015-09-24 DIAGNOSIS — Z7982 Long term (current) use of aspirin: Secondary | ICD-10-CM

## 2015-09-24 DIAGNOSIS — J069 Acute upper respiratory infection, unspecified: Secondary | ICD-10-CM | POA: Insufficient documentation

## 2015-09-24 DIAGNOSIS — I252 Old myocardial infarction: Secondary | ICD-10-CM

## 2015-09-24 DIAGNOSIS — E559 Vitamin D deficiency, unspecified: Secondary | ICD-10-CM | POA: Diagnosis present

## 2015-09-24 DIAGNOSIS — Z87891 Personal history of nicotine dependence: Secondary | ICD-10-CM | POA: Diagnosis not present

## 2015-09-24 DIAGNOSIS — M1990 Unspecified osteoarthritis, unspecified site: Secondary | ICD-10-CM | POA: Diagnosis present

## 2015-09-24 DIAGNOSIS — Z79899 Other long term (current) drug therapy: Secondary | ICD-10-CM | POA: Insufficient documentation

## 2015-09-24 DIAGNOSIS — J441 Chronic obstructive pulmonary disease with (acute) exacerbation: Secondary | ICD-10-CM | POA: Diagnosis present

## 2015-09-24 DIAGNOSIS — Z8673 Personal history of transient ischemic attack (TIA), and cerebral infarction without residual deficits: Secondary | ICD-10-CM | POA: Insufficient documentation

## 2015-09-24 DIAGNOSIS — Z853 Personal history of malignant neoplasm of breast: Secondary | ICD-10-CM | POA: Insufficient documentation

## 2015-09-24 DIAGNOSIS — K219 Gastro-esophageal reflux disease without esophagitis: Secondary | ICD-10-CM | POA: Insufficient documentation

## 2015-09-24 DIAGNOSIS — R140 Abdominal distension (gaseous): Secondary | ICD-10-CM | POA: Insufficient documentation

## 2015-09-24 DIAGNOSIS — I25119 Atherosclerotic heart disease of native coronary artery with unspecified angina pectoris: Secondary | ICD-10-CM | POA: Insufficient documentation

## 2015-09-24 DIAGNOSIS — I5043 Acute on chronic combined systolic (congestive) and diastolic (congestive) heart failure: Secondary | ICD-10-CM | POA: Diagnosis present

## 2015-09-24 DIAGNOSIS — H9190 Unspecified hearing loss, unspecified ear: Secondary | ICD-10-CM

## 2015-09-24 DIAGNOSIS — R011 Cardiac murmur, unspecified: Secondary | ICD-10-CM | POA: Insufficient documentation

## 2015-09-24 DIAGNOSIS — Z7952 Long term (current) use of systemic steroids: Secondary | ICD-10-CM

## 2015-09-24 DIAGNOSIS — T380X5A Adverse effect of glucocorticoids and synthetic analogues, initial encounter: Secondary | ICD-10-CM | POA: Diagnosis present

## 2015-09-24 DIAGNOSIS — R600 Localized edema: Secondary | ICD-10-CM | POA: Insufficient documentation

## 2015-09-24 DIAGNOSIS — Z8701 Personal history of pneumonia (recurrent): Secondary | ICD-10-CM | POA: Insufficient documentation

## 2015-09-24 DIAGNOSIS — M810 Age-related osteoporosis without current pathological fracture: Secondary | ICD-10-CM | POA: Diagnosis present

## 2015-09-24 DIAGNOSIS — E860 Dehydration: Secondary | ICD-10-CM | POA: Diagnosis present

## 2015-09-24 DIAGNOSIS — B9789 Other viral agents as the cause of diseases classified elsewhere: Principal | ICD-10-CM

## 2015-09-24 DIAGNOSIS — R05 Cough: Secondary | ICD-10-CM | POA: Diagnosis not present

## 2015-09-24 DIAGNOSIS — J9601 Acute respiratory failure with hypoxia: Secondary | ICD-10-CM | POA: Diagnosis present

## 2015-09-24 DIAGNOSIS — Z955 Presence of coronary angioplasty implant and graft: Secondary | ICD-10-CM

## 2015-09-24 DIAGNOSIS — E1122 Type 2 diabetes mellitus with diabetic chronic kidney disease: Secondary | ICD-10-CM | POA: Diagnosis present

## 2015-09-24 DIAGNOSIS — I2510 Atherosclerotic heart disease of native coronary artery without angina pectoris: Secondary | ICD-10-CM | POA: Diagnosis present

## 2015-09-24 DIAGNOSIS — J440 Chronic obstructive pulmonary disease with acute lower respiratory infection: Secondary | ICD-10-CM | POA: Diagnosis present

## 2015-09-24 DIAGNOSIS — J209 Acute bronchitis, unspecified: Secondary | ICD-10-CM | POA: Diagnosis present

## 2015-09-24 DIAGNOSIS — E785 Hyperlipidemia, unspecified: Secondary | ICD-10-CM | POA: Insufficient documentation

## 2015-09-24 DIAGNOSIS — I509 Heart failure, unspecified: Secondary | ICD-10-CM | POA: Insufficient documentation

## 2015-09-24 DIAGNOSIS — Z8781 Personal history of (healed) traumatic fracture: Secondary | ICD-10-CM | POA: Insufficient documentation

## 2015-09-24 DIAGNOSIS — F329 Major depressive disorder, single episode, unspecified: Secondary | ICD-10-CM

## 2015-09-24 DIAGNOSIS — G629 Polyneuropathy, unspecified: Secondary | ICD-10-CM | POA: Diagnosis present

## 2015-09-24 DIAGNOSIS — I130 Hypertensive heart and chronic kidney disease with heart failure and stage 1 through stage 4 chronic kidney disease, or unspecified chronic kidney disease: Secondary | ICD-10-CM | POA: Diagnosis present

## 2015-09-24 DIAGNOSIS — F419 Anxiety disorder, unspecified: Secondary | ICD-10-CM

## 2015-09-24 DIAGNOSIS — E1165 Type 2 diabetes mellitus with hyperglycemia: Secondary | ICD-10-CM | POA: Diagnosis present

## 2015-09-24 DIAGNOSIS — M4800 Spinal stenosis, site unspecified: Secondary | ICD-10-CM | POA: Diagnosis present

## 2015-09-24 DIAGNOSIS — E875 Hyperkalemia: Secondary | ICD-10-CM | POA: Diagnosis present

## 2015-09-24 DIAGNOSIS — E1151 Type 2 diabetes mellitus with diabetic peripheral angiopathy without gangrene: Secondary | ICD-10-CM | POA: Diagnosis present

## 2015-09-24 DIAGNOSIS — N183 Chronic kidney disease, stage 3 (moderate): Secondary | ICD-10-CM | POA: Diagnosis present

## 2015-09-24 HISTORY — DX: Heart failure, unspecified: I50.9

## 2015-09-24 LAB — BASIC METABOLIC PANEL
ANION GAP: 13 (ref 5–15)
BUN: 26 mg/dL — ABNORMAL HIGH (ref 6–20)
CALCIUM: 8.8 mg/dL — AB (ref 8.9–10.3)
CO2: 26 mmol/L (ref 22–32)
Chloride: 93 mmol/L — ABNORMAL LOW (ref 101–111)
Creatinine, Ser: 1.26 mg/dL — ABNORMAL HIGH (ref 0.44–1.00)
GFR, EST AFRICAN AMERICAN: 44 mL/min — AB (ref >60)
GFR, EST NON AFRICAN AMERICAN: 38 mL/min — AB (ref >60)
GLUCOSE: 172 mg/dL — AB (ref 65–99)
POTASSIUM: 4.6 mmol/L (ref 3.5–5.1)
SODIUM: 132 mmol/L — AB (ref 135–145)

## 2015-09-24 LAB — CBC WITH DIFFERENTIAL/PLATELET
BASOS ABS: 0 10*3/uL (ref 0.0–0.1)
BASOS PCT: 0 %
EOS PCT: 1 %
Eosinophils Absolute: 0.1 10*3/uL (ref 0.0–0.7)
HCT: 35 % — ABNORMAL LOW (ref 36.0–46.0)
Hemoglobin: 11.4 g/dL — ABNORMAL LOW (ref 12.0–15.0)
LYMPHS ABS: 0.6 10*3/uL — AB (ref 0.7–4.0)
LYMPHS PCT: 12 %
MCH: 32.1 pg (ref 26.0–34.0)
MCHC: 32.6 g/dL (ref 30.0–36.0)
MCV: 98.6 fL (ref 78.0–100.0)
MONO ABS: 0.5 10*3/uL (ref 0.1–1.0)
Monocytes Relative: 10 %
Neutro Abs: 4.1 10*3/uL (ref 1.7–7.7)
Neutrophils Relative %: 77 %
PLATELETS: 181 10*3/uL (ref 150–400)
RBC: 3.55 MIL/uL — AB (ref 3.87–5.11)
RDW: 12.8 % (ref 11.5–15.5)
WBC: 5.3 10*3/uL (ref 4.0–10.5)

## 2015-09-24 MED ORDER — ALBUTEROL SULFATE (2.5 MG/3ML) 0.083% IN NEBU
INHALATION_SOLUTION | RESPIRATORY_TRACT | Status: AC
  Filled 2015-09-24: qty 6

## 2015-09-24 MED ORDER — ACETAMINOPHEN 325 MG PO TABS
ORAL_TABLET | ORAL | Status: AC
  Administered 2015-09-24: 650 mg
  Filled 2015-09-24: qty 2

## 2015-09-24 MED ORDER — ALBUTEROL SULFATE (2.5 MG/3ML) 0.083% IN NEBU
5.0000 mg | INHALATION_SOLUTION | Freq: Once | RESPIRATORY_TRACT | Status: AC
  Administered 2015-09-24: 5 mg via RESPIRATORY_TRACT

## 2015-09-24 NOTE — ED Notes (Signed)
Pt approached nurse fist station stating her breathing was getting worse and requested another breathing txt.

## 2015-09-24 NOTE — Telephone Encounter (Signed)
[facility] Patient Name: Haley Harris DOB: Jul 16, 1928 Initial Comment Head/sinus congestion Nurse Assessment Nurse: Doyle Askew, RN, Joelene Millin Date/Time (Eastern Time): 09/24/2015 4:15:27 PM Confirm and document reason for call. If symptomatic, describe symptoms. You must click the next button to save text entered. ---[name] states head/sinus congestion and fever. Has the patient traveled out of the country within the last 30 days? ---Not Applicable Does the patient have any new or worsening symptoms? ---Yes Will a triage be completed? ---No Select reason for no triage. ---Other Please document clinical information provided and list any resource used. ---Pt had called in (this chart) and then dtr had called in so family had [facility]. Pt was told to go in to ER which is what they are going to do. Guidelines Guideline Title Affirmed Question Affirmed Notes Final Disposition User Clinical Call Doyle Askew, RN, Joelene Millin Comments Nurse was unable to leave message as mailbox hasn't been set up

## 2015-09-24 NOTE — Telephone Encounter (Signed)
[facility]     Patient Name: Haley Harris Initial Comment [name] states her mother is having difficulty breathing. She is a congestive heart failure patient.  DOB: 1928-11-10      Nurse Assessment  Nurse: Venetia Maxon, RN, Manuela Schwartz Date/Time (Eastern Time): 09/24/2015 3:48:23 PM  Confirm and document reason for call. If symptomatic, describe symptoms. You must click the next button to save text entered. ---[name] states her mother is having difficulty breathing. She is a congestive heart failure patient. She usually has some edema in lower anxiety. she has SOB at rest . WT 170 1/2 Pulse ox was cannot check due to cold hands . head congestion sinuses hurt  Has the patient traveled out of the country within the last 30 days? ---No  Does the patient have any new or worsening symptoms? ---Yes  Will a triage be completed? ---Yes  Related visit to physician within the last 2 weeks? ---No  Does the PT have any chronic conditions? (i.e. diabetes, asthma, etc.) ---Yes  List chronic conditions. ---CHF COPD breathing neb machine had one this am HTN  Is this a behavioral health or substance abuse call? ---No    Guidelines     Guideline Title Affirmed Question Affirmed Notes   Breathing Difficulty [1] MODERATE difficulty breathing (e.g., speaks in phrases, SOB even at rest, pulse 100-120) AND [2] NEW-onset or WORSE than normal    Final Disposition User   Go to ED Now Venetia Maxon, RN, [facility] - ED   Disagree/Comply: Comply

## 2015-09-24 NOTE — ED Provider Notes (Signed)
CSN: [id number]     Arrival date & time 09/24/15  1733 History   By signing my name below, I, Haley Harris, attest that this documentation has been prepared under the direction and in the presence of Haley Balls, MD . Electronically Signed: Rowan Harris, Scribe. 09/25/2015. 12:30 AM.   Chief Complaint  Patient presents with  . Cough    The history is provided by the patient. No language interpreter was used.   HPI Comments:  Haley Harris is a 82 y.o. female with PMHx of HLD, HTN, PVD, MI, CAD and CHF who presents to the Emergency Department complaining of persistent cough productive of yellow sputum for the past three days. Pt reports associated congestion, persistent worsening shortness of breath, headache, sinus pressure, wheezing, low appetite, gassiness and fever tmax 100.8. No treatments attempted PTA. Pt states breathing treatments in ED provided relief. Pt reports sick contact with the flu. Pt notes compliance with current medications and denies any weight changes. Pt denies body aches, nausea, or vomiting.   Past Medical History  Diagnosis Date  . Allergic rhinitis, cause unspecified   . Unspecified cardiovascular disease   . Personal history of malignant neoplasm of breast   . Occlusion and stenosis of carotid artery without mention of cerebral infarction   . Chronic airway obstruction, not elsewhere classified   . Depressive disorder, not elsewhere classified   . Other dyspnea and respiratory abnormality   . Esophageal reflux   . Unspecified hearing loss   . Other and unspecified hyperlipidemia   . Unspecified essential hypertension   . Osteoporosis, unspecified   . Peripheral vascular disease, unspecified ([place])   . Unspecified urinary incontinence   . Spinal stenosis   . ACE-inhibitor cough   . Angina   . Heart murmur     aS CHILD  . Shortness of breath   . Recurrent upper respiratory infection (URI)   . Anxiety   . Pneumonia   . 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 Myocardial infarct ([place])   . Osteoarthrosis, unspecified whether generalized or localized, unspecified site   . Malignant neoplasm of breast (female), unspecified site   . Compression fracture of T12 vertebra ([place]) 10/28/2011  . CHF (congestive heart failure) [facility])    Past Surgical History  Procedure Laterality Date  . Mastectomy  1987    Right  . Breast reconstruction  1998    Reconstruction   . Reduction mammaplasty  1998    Left  . Mastoidectomy  childhood  . Appendectomy  1948  . Lumbar laminectomy  2003  . Shoulder surgery  02/2005    Bilateral fractures with multiple surgeries  . Breast implant removal  06/12/09    right  . Abdominal hysterectomy    . Fracture surgery      fracture right elbow  . Coronary angioplasty  11/12    distal RCA  . Mastectomy    . Left heart catheterization with coronary angiogram N/A 06/05/2011    Procedure: LEFT HEART CATHETERIZATION WITH CORONARY ANGIOGRAM;  Surgeon: Clent Demark, MD;  Location: [facility] CATH LAB;  Service: Cardiovascular;  Laterality: N/A;  . Left heart catheterization with coronary angiogram N/A 03/05/2014    Procedure: LEFT HEART CATHETERIZATION WITH CORONARY ANGIOGRAM;  Surgeon: Clent Demark, MD;  Location: [facility] CATH LAB;  Service: Cardiovascular;  Laterality: N/A;  . Flexible sigmoidoscopy N/A 01/21/2015    Procedure: FLEXIBLE SIGMOIDOSCOPY;  Surgeon: Richmond Campbell, MD;  Location: [facility]  ENDOSCOPY;  Service: Endoscopy;  Laterality: N/A;  . Flexible sigmoidoscopy  01/22/2015       . Cardiac catheterization N/A 01/25/2015    Procedure: Left Heart Cath and Coronary Angiography;  Surgeon: Charolette Forward, MD;  Location: [facility] CV LAB;  Service: Cardiovascular;  Laterality: N/A;   Family History  Problem Relation Age of Onset  . Heart attack Father 72  . Cancer Mother     ovarian  . Cancer Brother     lung  . Arthritis      family   Social History  Substance Use Topics  . Smoking status:  Former Smoker    Quit date: 07/13/1974  . Smokeless tobacco: Never Used  . Alcohol Use: 0.0 oz/week    0 Standard drinks or equivalent per week     Comment: occasional   OB History    No data available     Review of Systems  Constitutional: Positive for fever and appetite change.  HENT: Positive for congestion and sinus pressure.   Respiratory: Positive for cough, shortness of breath and wheezing.   Gastrointestinal: Negative for nausea and vomiting.  Musculoskeletal: Negative for myalgias.  Neurological: Positive for headaches.  All other systems reviewed and are negative.  Allergies  Amoxicillin-pot clavulanate; Cephalexin; Clarithromycin; Lidocaine; Nifedipine; Nsaids; Olmesartan medoxomil; Penicillins; and Tramadol hcl  Home Medications   Prior to Admission medications   Medication Sig Start Date End Date Taking? Authorizing Provider  acetaminophen (TYLENOL) 325 MG tablet Take 2 tablets (650 mg total) by mouth every 4 (four) hours as needed for headache or mild pain. 01/27/15  Yes Bonnielee Haff, MD  albuterol (PROVENTIL) (2.5 MG/3ML) 0.083% nebulizer solution Take 3 mLs (2.5 mg total) by nebulization every 6 (six) hours as needed for wheezing or shortness of breath. 02/18/15  Yes Venia Carbon, MD  aspirin 81 MG tablet Take 81 mg by mouth daily after lunch.    Yes Historical Provider, MD  Calcium Carbonate-Vitamin D (CALTRATE 600+D PO) Take 1 tablet by mouth daily after lunch.    Yes Historical Provider, MD  carvedilol (COREG) 12.5 MG tablet Take 1 tablet (12.5 mg total) by mouth 2 (two) times daily with a meal. Patient taking differently: Take 6.25 mg by mouth 2 (two) times daily with a meal.  09/13/15  Yes Venia Carbon, MD  cetirizine (ZYRTEC) 10 MG tablet Take 10 mg by mouth daily. For allergies   Yes Historical Provider, MD  denosumab (PROLIA) 60 MG/ML SOLN injection Inject 60 mg into the skin every 6 (six) months. Administer in upper arm, thigh, or abdomen   Yes  Historical Provider, MD  escitalopram (LEXAPRO) 10 MG tablet Take 1 tablet by mouth  daily 08/26/15  Yes Venia Carbon, MD  famotidine (PEPCID) 20 MG tablet Take 1 tablet (20 mg total) by mouth 2 (two) times daily. 09/13/15  Yes Venia Carbon, MD  fluticasone (FLONASE) 50 MCG/ACT nasal spray Place 2 sprays into both nostrils daily. 09/13/15  Yes Venia Carbon, MD  furosemide (LASIX) 40 MG tablet Take 1 tablet by mouth  daily 08/26/15  Yes Venia Carbon, MD  gabapentin (NEURONTIN) 100 MG capsule Take 1 capsule (100 mg total) by mouth 2 (two) times daily. 07/23/14  Yes Venia Carbon, MD  losartan (COZAAR) 50 MG tablet TAKE 1 TABLET BY MOUTH DAILY 09/13/15  Yes Venia Carbon, MD  montelukast (SINGULAIR) 10 MG tablet TAKE 1 TABLET BY MOUTH DAILY 09/13/15  Yes Venia Carbon,  MD  Multiple Vitamin (MULITIVITAMIN WITH MINERALS) TABS Take 1 tablet by mouth daily after lunch.    Yes Historical Provider, MD  oxyCODONE-acetaminophen (PERCOCET/ROXICET) 5-325 MG per tablet Take 1-2 tablets by mouth every 4 (four) hours as needed. 01/28/15  Yes Bonnielee Haff, MD  pravastatin (PRAVACHOL) 80 MG tablet Take 1 tablet by mouth  daily 05/09/15  Yes Venia Carbon, MD  spironolactone (ALDACTONE) 25 MG tablet Take 1 tablet by mouth  daily 08/26/15  Yes Venia Carbon, MD  vitamin B-12 (CYANOCOBALAMIN) 500 MCG tablet Take 1,000 mcg by mouth 3 (three) times a week. Mon, Wed, Fri   Yes Historical Provider, MD   BP 111/57 mmHg  Pulse 66  Temp(Src) 97 F (36.1 C) (Oral)  Resp 18  Wt 172 lb 4.8 oz (78.155 kg)  SpO2 93% Physical Exam  Constitutional: She is oriented to person, place, and time. She appears well-developed and well-nourished. No distress.  HENT:  Head: Normocephalic and atraumatic.  Nose: Nose normal.  Mouth/Throat: Oropharynx is clear and moist. No oropharyngeal exudate.  Eyes: Conjunctivae and EOM are normal. Pupils are equal, round, and reactive to light. No scleral icterus.  Neck: Normal  range of motion. Neck supple. No JVD present. No tracheal deviation present. No thyromegaly present.  Cardiovascular: Normal rate, regular rhythm and normal heart sounds.  Exam reveals no gallop and no friction rub.   No murmur heard. Pulmonary/Chest: Effort normal. No respiratory distress. She has wheezes. She exhibits no tenderness.  left upper lobe wheezing, isolated  Abdominal: Soft. Bowel sounds are normal. She exhibits distension. She exhibits no mass. There is no tenderness. There is no rebound and no guarding.  Musculoskeletal: Normal range of motion. She exhibits edema. She exhibits no tenderness.  trace edema  Lymphadenopathy:    She has no cervical adenopathy.  Neurological: She is alert and oriented to person, place, and time. No cranial nerve deficit. She exhibits normal muscle tone.  Skin: Skin is warm and dry. No rash noted. No erythema. No pallor.  Nursing note and vitals reviewed.  ED Course  Procedures  DIAGNOSTIC STUDIES:  Oxygen Saturation is 95% on RA, normal by my interpretation.    COORDINATION OF CARE:  12:23 AM Informed pt that chest x-ray does not indicate PNA. Will order chest CT to r/o PNA due to worsening symptoms. Discussed treatment plan with pt at bedside and pt agreed to plan.  Labs Review Labs Reviewed  CBC WITH DIFFERENTIAL/PLATELET - Abnormal; Notable for the following:    RBC 3.55 (*)    Hemoglobin 11.4 (*)    HCT 35.0 (*)    Lymphs Abs 0.6 (*)    All other components within normal limits  BASIC METABOLIC PANEL - Abnormal; Notable for the following:    Sodium 132 (*)    Chloride 93 (*)    Glucose, Bld 172 (*)    BUN 26 (*)    Creatinine, Ser 1.26 (*)    Calcium 8.8 (*)    GFR calc non Af Amer 38 (*)    GFR calc Af Amer 44 (*)    All other components within normal limits    Imaging Review Dg Chest 2 View  09/24/2015  CLINICAL DATA:  Congestion, cough and exertional shortness breath for 3 days. History of CHF. EXAM: CHEST  2 VIEW  COMPARISON:  Chest x-rays dated 01/26/2015 and 03/01/2014. FINDINGS: Mild cardiomegaly is stable. Overall cardiomediastinal silhouette is stable in size and configuration. Prominence of the pulmonary arteries suggests  chronic pulmonary artery hypertension. Coarse interstitial markings are again seen within each lung, similar to the previous exam of 01/26/2015. No evidence of a superimposed interstitial edema on today's exam, as was seen on earlier chest x-ray of 01/18/2015. No confluent airspace opacity to suggest a developing pneumonia. No pleural effusions seen. Degenerative changes are again noted throughout the kyphotic thoracic spine. Multiple chronic compression fracture deformities again appreciated within the thoracic spine. No acute- appearing osseous abnormality. IMPRESSION: Stable chest x-ray. No evidence of acute cardiopulmonary abnormality. Stable cardiomegaly. Evidence of chronic interstitial lung disease. Evidence of chronic pulmonary artery hypertension. Multiple old compression fracture deformities within thoracic spine. Electronically Signed   By: Franki Cabot M.D.   On: 09/24/2015 19:02   Ct Chest Wo Contrast  09/25/2015  CLINICAL DATA:  Fever and productive cough. Isolated left upper lobe wheezing. Evaluate for pneumonia EXAM: CT CHEST WITHOUT CONTRAST TECHNIQUE: Multidetector CT imaging of the chest was performed following the standard protocol without IV contrast. COMPARISON:  01/19/2015 FINDINGS: THORACIC INLET/BODY WALL: Right mastectomy. MEDIASTINUM: Chronic cardiomegaly with trace pericardial fluid or thickening. No evidence of acute vascular disease. Diffuse atherosclerosis, including the coronary arteries. No adenopathy. Negative esophagus. LUNG WINDOWS: Mild dependent atelectasis or scar. Trace pleural effusions. Lungs have a stable appearance since 2016. There is no edema, consolidation, effusion, or pneumothorax. UPPER ABDOMEN: Chronic 3 cm right adrenal mass, benign given  stability and attributed to adenoma. Benign hepatic low densities which are small. OSSEOUS: Remote compression fractures at T1, T6, T8, and T12. The T6 and T12 fractures have advanced height loss with vertebra plana. No acute osseous finding. IMPRESSION: 1. Negative for pneumonia.  Stable exam compared to July 2016. 2. Chronic basilar atelectasis/ scarring and trace effusions. Electronically Signed   By: Monte Fantasia M.D.   On: 09/25/2015 02:30   I have personally reviewed and evaluated these images and lab results as part of my medical decision-making.   EKG Interpretation   Date/Time:  Tuesday September 24 2015 18:09:05 EDT Ventricular Rate:  91 PR Interval:  184 QRS Duration: 96 QT Interval:  398 QTC Calculation: 489 R Axis:   178 Text Interpretation:  Normal sinus rhythm Possible Anterolateral infarct ,  age undetermined Abnormal ECG No significant change since last tracing  Confirmed by Glynn Octave [phone]) on 09/25/2015 12:02:26 AM      MDM   Final diagnoses:  None   Patient presents to emergency department for viral respiratory infection symptoms. She has exposure to influenza however history is not consistent with this. CT scan of the chest is negative for any pneumonia. Upon repeat evaluation, her wheezing has improved. Fever has resolved. Patient advised to continue albuterol at home standing for 2 days then as needed. Discharge home with prednisone course.  Priary care Follow-up advised in 3 days. She appears well and in no acute distress, vital signs were within her normal limits and she is safe for discharge.     I personally performed the services described in this documentation, which was scribed in my presence. The recorded information has been reviewed and is accurate.      Haley Balls, MD 09/25/15 [phone]

## 2015-09-24 NOTE — Telephone Encounter (Signed)
Sounds like they are taking her to UC or ED Please check on her tomorrow to see how she is doing  I did not get this until after 5  Thanks

## 2015-09-24 NOTE — ED Notes (Addendum)
Pt reports congestion, cough and exertional sob x 3 days. Pt daughter who she lives with has the flu.

## 2015-09-25 ENCOUNTER — Ambulatory Visit: Payer: Medicare Other | Admitting: Podiatry

## 2015-09-25 ENCOUNTER — Emergency Department (HOSPITAL_COMMUNITY): Payer: Medicare Other

## 2015-09-25 DIAGNOSIS — R05 Cough: Secondary | ICD-10-CM | POA: Diagnosis not present

## 2015-09-25 MED ORDER — PREDNISONE 20 MG PO TABS: 60.0000 mg | ORAL_TABLET | Freq: Every day | ORAL | Status: DC

## 2015-09-25 MED ORDER — PREDNISONE 20 MG PO TABS
60.0000 mg | ORAL_TABLET | Freq: Once | ORAL | Status: AC
  Administered 2015-09-25: 60 mg via ORAL
  Filled 2015-09-25: qty 3

## 2015-09-25 NOTE — Telephone Encounter (Signed)
Left voicemail requesting pt to call office back and update [place] on how she is feeling after leaving hospital

## 2015-09-25 NOTE — ED Notes (Signed)
Dr. Oni at bedside at this time.  

## 2015-09-25 NOTE — Telephone Encounter (Signed)
Pt did go to ER (in [facility]), called pt and no answer, ? If she is still in hospital

## 2015-09-25 NOTE — Discharge Instructions (Signed)
Upper Respiratory Infection, Adult [name], Take prednisone daily for 5 days to complete your treatment.  Use your albuterol every 8 hours for 2 days, then only use as needed.  See your primary care doctor within 3 days for close follow up.  If symptoms worsen, come back to the ED immediately. Thank you. Most upper respiratory infections (URIs) are caused by a virus. A URI affects the nose, throat, and upper air passages. The most common type of URI is often called "the common cold." HOME CARE   Take medicines only as told by your doctor.  Gargle warm saltwater or take cough drops to comfort your throat as told by your doctor.  Use a warm mist humidifier or inhale steam from a shower to increase air moisture. This may make it easier to breathe.  Drink enough fluid to keep your pee (urine) clear or pale yellow.  Eat soups and other clear broths.  Have a healthy diet.  Rest as needed.  Go back to work when your fever is gone or your doctor says it is okay.  You may need to stay home longer to avoid giving your URI to others.  You can also wear a face mask and wash your hands often to prevent spread of the virus.  Use your inhaler more if you have asthma.  Do not use any tobacco products, including cigarettes, chewing tobacco, or electronic cigarettes. If you need help quitting, ask your doctor. GET HELP IF:  You are getting worse, not better.  Your symptoms are not helped by medicine.  You have chills.  You are getting more short of breath.  You have brown or red mucus.  You have yellow or brown discharge from your nose.  You have pain in your face, especially when you bend forward.  You have a fever.  You have puffy (swollen) neck glands.  You have pain while swallowing.  You have white areas in the back of your throat. GET HELP RIGHT AWAY IF:   You have very bad or constant:  Headache.  Ear pain.  Pain in your forehead, behind your eyes, and over your  cheekbones (sinus pain).  Chest pain.  You have long-lasting (chronic) lung disease and any of the following:  Wheezing.  Long-lasting cough.  Coughing up blood.  A change in your usual mucus.  You have a stiff neck.  You have changes in your:  Vision.  Hearing.  Thinking.  Mood. MAKE SURE YOU:   Understand these instructions.  Will watch your condition.  Will get help right away if you are not doing well or get worse.   This information is not intended to replace advice given to you by your health care provider. Make sure you discuss any questions you have with your health care provider.   Document Released: 12/16/2007 Document Revised: 11/13/2014 Document Reviewed: 10/04/2013 Elsevier Interactive Patient Education [employer].

## 2015-09-26 NOTE — Telephone Encounter (Signed)
Pt scheduled a hospital f/u with Allie Bossier, NP on 09/27/15

## 2015-09-27 ENCOUNTER — Ambulatory Visit: Payer: Medicare Other | Admitting: Primary Care

## 2015-09-27 ENCOUNTER — Encounter (HOSPITAL_COMMUNITY): Payer: Self-pay | Admitting: *Deleted

## 2015-09-27 ENCOUNTER — Emergency Department (HOSPITAL_COMMUNITY): Payer: Medicare Other

## 2015-09-27 ENCOUNTER — Inpatient Hospital Stay (HOSPITAL_COMMUNITY)
Admission: EM | Admit: 2015-09-27 | Discharge: 2015-10-09 | DRG: 193 | Disposition: A | Discharge status: Home Health | Payer: Medicare Other | Attending: Internal Medicine | Admitting: Internal Medicine

## 2015-09-27 DIAGNOSIS — I25119 Atherosclerotic heart disease of native coronary artery with unspecified angina pectoris: Secondary | ICD-10-CM | POA: Diagnosis present

## 2015-09-27 DIAGNOSIS — R0602 Shortness of breath: Secondary | ICD-10-CM

## 2015-09-27 DIAGNOSIS — J441 Chronic obstructive pulmonary disease with (acute) exacerbation: Secondary | ICD-10-CM

## 2015-09-27 DIAGNOSIS — E279 Disorder of adrenal gland, unspecified: Secondary | ICD-10-CM | POA: Diagnosis not present

## 2015-09-27 DIAGNOSIS — I5020 Unspecified systolic (congestive) heart failure: Secondary | ICD-10-CM

## 2015-09-27 DIAGNOSIS — E871 Hypo-osmolality and hyponatremia: Secondary | ICD-10-CM | POA: Diagnosis present

## 2015-09-27 DIAGNOSIS — J9601 Acute respiratory failure with hypoxia: Secondary | ICD-10-CM | POA: Diagnosis not present

## 2015-09-27 DIAGNOSIS — I739 Peripheral vascular disease, unspecified: Secondary | ICD-10-CM

## 2015-09-27 DIAGNOSIS — N179 Acute kidney failure, unspecified: Secondary | ICD-10-CM | POA: Diagnosis present

## 2015-09-27 DIAGNOSIS — J440 Chronic obstructive pulmonary disease with acute lower respiratory infection: Secondary | ICD-10-CM

## 2015-09-27 DIAGNOSIS — J9691 Respiratory failure, unspecified with hypoxia: Secondary | ICD-10-CM | POA: Diagnosis not present

## 2015-09-27 DIAGNOSIS — G63 Polyneuropathy in diseases classified elsewhere: Secondary | ICD-10-CM | POA: Diagnosis present

## 2015-09-27 DIAGNOSIS — I5043 Acute on chronic combined systolic (congestive) and diastolic (congestive) heart failure: Secondary | ICD-10-CM

## 2015-09-27 DIAGNOSIS — N183 Chronic kidney disease, stage 3 unspecified: Secondary | ICD-10-CM | POA: Diagnosis present

## 2015-09-27 DIAGNOSIS — J209 Acute bronchitis, unspecified: Secondary | ICD-10-CM

## 2015-09-27 DIAGNOSIS — E278 Other specified disorders of adrenal gland: Secondary | ICD-10-CM | POA: Diagnosis present

## 2015-09-27 DIAGNOSIS — E875 Hyperkalemia: Secondary | ICD-10-CM | POA: Diagnosis not present

## 2015-09-27 DIAGNOSIS — Z7722 Contact with and (suspected) exposure to environmental tobacco smoke (acute) (chronic): Secondary | ICD-10-CM | POA: Diagnosis not present

## 2015-09-27 DIAGNOSIS — E785 Hyperlipidemia, unspecified: Secondary | ICD-10-CM | POA: Diagnosis present

## 2015-09-27 LAB — I-STAT VENOUS BLOOD GAS, ED
BICARBONATE: 26.3 meq/L — AB (ref 20.0–24.0)
O2 SAT: 68 %
PO2 VEN: 38 mmHg (ref 31.0–45.0)
TCO2: 28 mmol/L (ref 0–100)
pCO2, Ven: 49.4 mmHg (ref 45.0–50.0)
pH, Ven: 7.334 — ABNORMAL HIGH (ref 7.250–7.300)

## 2015-09-27 LAB — CBC WITH DIFFERENTIAL/PLATELET
Basophils Absolute: 0 10*3/uL (ref 0.0–0.1)
Basophils Relative: 0 %
EOS ABS: 0 10*3/uL (ref 0.0–0.7)
Eosinophils Relative: 0 %
HCT: 34.8 % — ABNORMAL LOW (ref 36.0–46.0)
HEMOGLOBIN: 11.3 g/dL — AB (ref 12.0–15.0)
LYMPHS ABS: 0.6 10*3/uL — AB (ref 0.7–4.0)
LYMPHS PCT: 7 %
MCH: 31.4 pg (ref 26.0–34.0)
MCHC: 32.5 g/dL (ref 30.0–36.0)
MCV: 96.7 fL (ref 78.0–100.0)
Monocytes Absolute: 0.7 10*3/uL (ref 0.1–1.0)
Monocytes Relative: 8 %
NEUTROS PCT: 85 %
Neutro Abs: 7.2 10*3/uL (ref 1.7–7.7)
Platelets: 215 10*3/uL (ref 150–400)
RBC: 3.6 MIL/uL — AB (ref 3.87–5.11)
RDW: 12.6 % (ref 11.5–15.5)
WBC: 8.5 10*3/uL (ref 4.0–10.5)

## 2015-09-27 LAB — CBG MONITORING, ED
GLUCOSE-CAPILLARY: 131 mg/dL — AB (ref 65–99)
Glucose-Capillary: 137 mg/dL — ABNORMAL HIGH (ref 65–99)
Glucose-Capillary: 142 mg/dL — ABNORMAL HIGH (ref 65–99)

## 2015-09-27 LAB — I-STAT TROPONIN, ED
TROPONIN I, POC: 0.02 ng/mL (ref 0.00–0.08)
TROPONIN I, POC: 0.04 ng/mL (ref 0.00–0.08)

## 2015-09-27 LAB — I-STAT CG4 LACTIC ACID, ED
LACTIC ACID, VENOUS: 0.91 mmol/L (ref 0.5–2.0)
Lactic Acid, Venous: 1.55 mmol/L (ref 0.5–2.0)

## 2015-09-27 LAB — COMPREHENSIVE METABOLIC PANEL
ALBUMIN: 3.5 g/dL (ref 3.5–5.0)
ALT: 17 U/L (ref 14–54)
AST: 47 U/L — AB (ref 15–41)
Alkaline Phosphatase: 48 U/L (ref 38–126)
Anion gap: 14 (ref 5–15)
BUN: 34 mg/dL — AB (ref 6–20)
CHLORIDE: 91 mmol/L — AB (ref 101–111)
CO2: 22 mmol/L (ref 22–32)
CREATININE: 1.34 mg/dL — AB (ref 0.44–1.00)
Calcium: 8.5 mg/dL — ABNORMAL LOW (ref 8.9–10.3)
GFR calc Af Amer: 41 mL/min — ABNORMAL LOW (ref >60)
GFR, EST NON AFRICAN AMERICAN: 35 mL/min — AB (ref >60)
GLUCOSE: 223 mg/dL — AB (ref 65–99)
Potassium: 5.2 mmol/L — ABNORMAL HIGH (ref 3.5–5.1)
Sodium: 127 mmol/L — ABNORMAL LOW (ref 135–145)
Total Bilirubin: 1.1 mg/dL (ref 0.3–1.2)
Total Protein: 6.3 g/dL — ABNORMAL LOW (ref 6.5–8.1)

## 2015-09-27 LAB — INFLUENZA PANEL BY PCR (TYPE A & B)
H1N1 flu by pcr: NOT DETECTED
INFLAPCR: POSITIVE — AB
Influenza B By PCR: NEGATIVE

## 2015-09-27 LAB — GLUCOSE, CAPILLARY
GLUCOSE-CAPILLARY: 171 mg/dL — AB (ref 65–99)
GLUCOSE-CAPILLARY: 200 mg/dL — AB (ref 65–99)

## 2015-09-27 LAB — BRAIN NATRIURETIC PEPTIDE: B NATRIURETIC PEPTIDE 5: 444.1 pg/mL — AB (ref 0.0–100.0)

## 2015-09-27 MED ORDER — LEVOFLOXACIN IN D5W 750 MG/150ML IV SOLN
750.0000 mg | INTRAVENOUS | Status: DC
  Administered 2015-09-29: 750 mg via INTRAVENOUS
  Filled 2015-09-27: qty 150

## 2015-09-27 MED ORDER — SODIUM CHLORIDE 0.9% FLUSH
3.0000 mL | Freq: Two times a day (BID) | INTRAVENOUS | Status: DC
  Administered 2015-09-27 – 2015-10-09 (×21): 3 mL via INTRAVENOUS

## 2015-09-27 MED ORDER — CARVEDILOL 6.25 MG PO TABS
6.2500 mg | ORAL_TABLET | Freq: Two times a day (BID) | ORAL | Status: DC
  Administered 2015-09-27 – 2015-10-09 (×24): 6.25 mg via ORAL
  Filled 2015-09-27 (×24): qty 1

## 2015-09-27 MED ORDER — LEVOFLOXACIN IN D5W 750 MG/150ML IV SOLN: 750.0000 mg | INTRAVENOUS | Status: DC

## 2015-09-27 MED ORDER — ADULT MULTIVITAMIN W/MINERALS CH
1.0000 | ORAL_TABLET | Freq: Every day | ORAL | Status: DC
  Administered 2015-09-27 – 2015-10-09 (×13): 1 via ORAL
  Filled 2015-09-27 (×13): qty 1

## 2015-09-27 MED ORDER — ASPIRIN 81 MG PO CHEW
81.0000 mg | CHEWABLE_TABLET | Freq: Every day | ORAL | Status: DC
  Administered 2015-09-27 – 2015-10-09 (×13): 81 mg via ORAL
  Filled 2015-09-27 (×13): qty 1

## 2015-09-27 MED ORDER — IPRATROPIUM BROMIDE 0.02 % IN SOLN
0.5000 mg | Freq: Once | RESPIRATORY_TRACT | Status: AC
  Administered 2015-09-27: 0.5 mg via RESPIRATORY_TRACT
  Filled 2015-09-27: qty 2.5

## 2015-09-27 MED ORDER — FAMOTIDINE 20 MG PO TABS
20.0000 mg | ORAL_TABLET | Freq: Two times a day (BID) | ORAL | Status: DC
  Administered 2015-09-27: 20 mg via ORAL
  Filled 2015-09-27: qty 1

## 2015-09-27 MED ORDER — IPRATROPIUM-ALBUTEROL 0.5-2.5 (3) MG/3ML IN SOLN
3.0000 mL | Freq: Three times a day (TID) | RESPIRATORY_TRACT | Status: DC
  Administered 2015-09-27 – 2015-10-02 (×14): 3 mL via RESPIRATORY_TRACT
  Filled 2015-09-27 (×16): qty 3

## 2015-09-27 MED ORDER — INSULIN ASPART 100 UNIT/ML ~~LOC~~ SOLN: 0.0000 [IU] | SUBCUTANEOUS | Status: DC

## 2015-09-27 MED ORDER — LEVOFLOXACIN IN D5W 500 MG/100ML IV SOLN
500.0000 mg | Freq: Once | INTRAVENOUS | Status: AC
  Administered 2015-09-27: 500 mg via INTRAVENOUS
  Filled 2015-09-27: qty 100

## 2015-09-27 MED ORDER — GABAPENTIN 100 MG PO CAPS
100.0000 mg | ORAL_CAPSULE | Freq: Two times a day (BID) | ORAL | Status: DC
  Administered 2015-09-27 – 2015-10-09 (×25): 100 mg via ORAL
  Filled 2015-09-27 (×25): qty 1

## 2015-09-27 MED ORDER — ACETAMINOPHEN 325 MG PO TABS: 650.0000 mg | ORAL_TABLET | ORAL | Status: DC | PRN

## 2015-09-27 MED ORDER — ESCITALOPRAM OXALATE 10 MG PO TABS
10.0000 mg | ORAL_TABLET | Freq: Every day | ORAL | Status: DC
  Administered 2015-09-27 – 2015-10-09 (×13): 10 mg via ORAL
  Filled 2015-09-27 (×14): qty 1

## 2015-09-27 MED ORDER — IPRATROPIUM-ALBUTEROL 0.5-2.5 (3) MG/3ML IN SOLN
3.0000 mL | Freq: Four times a day (QID) | RESPIRATORY_TRACT | Status: DC
  Administered 2015-09-27: 3 mL via RESPIRATORY_TRACT
  Filled 2015-09-27: qty 3

## 2015-09-27 MED ORDER — METHYLPREDNISOLONE SODIUM SUCC 125 MG IJ SOLR
125.0000 mg | Freq: Once | INTRAMUSCULAR | Status: AC
  Administered 2015-09-27: 125 mg via INTRAVENOUS
  Filled 2015-09-27: qty 2

## 2015-09-27 MED ORDER — BUDESONIDE 0.5 MG/2ML IN SUSP
0.2500 mg | Freq: Two times a day (BID) | RESPIRATORY_TRACT | Status: DC
  Administered 2015-09-27: 0.5 mg via RESPIRATORY_TRACT
  Administered 2015-09-27 – 2015-09-30 (×6): 0.25 mg via RESPIRATORY_TRACT
  Filled 2015-09-27 (×13): qty 2

## 2015-09-27 MED ORDER — ONDANSETRON HCL 4 MG/2ML IJ SOLN: 4.0000 mg | Freq: Four times a day (QID) | INTRAMUSCULAR | Status: DC | PRN

## 2015-09-27 MED ORDER — ALBUTEROL SULFATE (2.5 MG/3ML) 0.083% IN NEBU
5.0000 mg | INHALATION_SOLUTION | Freq: Once | RESPIRATORY_TRACT | Status: AC
  Administered 2015-09-27: 5 mg via RESPIRATORY_TRACT
  Filled 2015-09-27: qty 6

## 2015-09-27 MED ORDER — IPRATROPIUM-ALBUTEROL 0.5-2.5 (3) MG/3ML IN SOLN: 3.0000 mL | RESPIRATORY_TRACT | Status: DC | PRN

## 2015-09-27 MED ORDER — FUROSEMIDE 10 MG/ML IJ SOLN
60.0000 mg | Freq: Once | INTRAMUSCULAR | Status: AC
  Administered 2015-09-27: 60 mg via INTRAVENOUS
  Filled 2015-09-27: qty 6

## 2015-09-27 MED ORDER — INSULIN ASPART 100 UNIT/ML ~~LOC~~ SOLN
0.0000 [IU] | Freq: Three times a day (TID) | SUBCUTANEOUS | Status: DC
  Administered 2015-09-27: 2 [IU] via SUBCUTANEOUS
  Administered 2015-09-27 – 2015-09-28 (×2): 3 [IU] via SUBCUTANEOUS
  Administered 2015-09-29: 2 [IU] via SUBCUTANEOUS
  Administered 2015-09-30: 5 [IU] via SUBCUTANEOUS
  Administered 2015-10-01 – 2015-10-02 (×3): 2 [IU] via SUBCUTANEOUS
  Administered 2015-10-03 – 2015-10-04 (×3): 3 [IU] via SUBCUTANEOUS
  Filled 2015-09-27: qty 1

## 2015-09-27 MED ORDER — SODIUM CHLORIDE 0.9 % IV SOLN: 250.0000 mL | INTRAVENOUS | Status: DC | PRN

## 2015-09-27 MED ORDER — INSULIN ASPART 100 UNIT/ML ~~LOC~~ SOLN: 0.0000 [IU] | Freq: Every day | SUBCUTANEOUS | Status: DC

## 2015-09-27 MED ORDER — OXYCODONE-ACETAMINOPHEN 5-325 MG PO TABS
1.0000 | ORAL_TABLET | ORAL | Status: DC | PRN
  Administered 2015-09-29 – 2015-10-01 (×3): 1 via ORAL
  Administered 2015-10-04 (×2): 2 via ORAL
  Administered 2015-10-06: 1 via ORAL
  Administered 2015-10-07: 2 via ORAL
  Filled 2015-09-27: qty 1
  Filled 2015-09-27: qty 2
  Filled 2015-09-27: qty 1
  Filled 2015-09-27: qty 2
  Filled 2015-09-27 (×2): qty 1
  Filled 2015-09-27: qty 2
  Filled 2015-09-27: qty 1

## 2015-09-27 MED ORDER — PRAVASTATIN SODIUM 40 MG PO TABS
80.0000 mg | ORAL_TABLET | Freq: Every day | ORAL | Status: DC
  Administered 2015-09-27: 80 mg via ORAL
  Filled 2015-09-27: qty 2

## 2015-09-27 MED ORDER — SODIUM CHLORIDE 0.9% FLUSH: 3.0000 mL | INTRAVENOUS | Status: DC | PRN

## 2015-09-27 MED ORDER — LORATADINE 10 MG PO TABS
10.0000 mg | ORAL_TABLET | Freq: Every day | ORAL | Status: DC
  Administered 2015-09-27 – 2015-10-09 (×13): 10 mg via ORAL
  Filled 2015-09-27 (×14): qty 1

## 2015-09-27 MED ORDER — FAMOTIDINE 20 MG PO TABS
20.0000 mg | ORAL_TABLET | Freq: Every day | ORAL | Status: DC
  Administered 2015-09-28 – 2015-10-09 (×12): 20 mg via ORAL
  Filled 2015-09-27 (×12): qty 1

## 2015-09-27 MED ORDER — MONTELUKAST SODIUM 10 MG PO TABS
10.0000 mg | ORAL_TABLET | Freq: Every day | ORAL | Status: DC
  Administered 2015-09-27 – 2015-10-08 (×12): 10 mg via ORAL
  Filled 2015-09-27 (×12): qty 1

## 2015-09-27 MED ORDER — METHYLPREDNISOLONE SODIUM SUCC 40 MG IJ SOLR
40.0000 mg | Freq: Two times a day (BID) | INTRAMUSCULAR | Status: DC
  Administered 2015-09-27 – 2015-09-29 (×4): 40 mg via INTRAVENOUS
  Filled 2015-09-27 (×4): qty 1

## 2015-09-27 MED ORDER — ENOXAPARIN SODIUM 40 MG/0.4ML ~~LOC~~ SOLN
40.0000 mg | SUBCUTANEOUS | Status: DC
Start: 2015-09-27 — End: 2015-10-09
  Administered 2015-09-27 – 2015-10-08 (×12): 40 mg via SUBCUTANEOUS
  Filled 2015-09-27 (×12): qty 0.4

## 2015-09-27 MED ORDER — PRAVASTATIN SODIUM 40 MG PO TABS
80.0000 mg | ORAL_TABLET | Freq: Every day | ORAL | Status: DC
  Administered 2015-09-27 – 2015-10-08 (×12): 80 mg via ORAL
  Filled 2015-09-27 (×13): qty 2

## 2015-09-27 NOTE — Progress Notes (Addendum)
Influenza PCR POSITIVE for A- pt's initial sx's began on 3/11 so out of window for Tamiflu- continue empiric Levaquin to cover for secondary pneumonia. At best CHF exacerbation was very mild and CXR changes likely more interstitial edema from flu virus. No further IV Lasix indicated and have held home diuretics. Hyponatremia could be actually due to mild volume depletion so will follow lytes post IV Lasix  Erin Hearing, ANP

## 2015-09-27 NOTE — ED Notes (Signed)
Patient presents via EMS  Patient called for SOB.  Upon EMS arrival, RA 60% - CPAP 98%. P116, BP 200/120.  Adm 5mg  Albuterol  Patient admits to cough times 4 days.  Seen here and dx with viral illness  States she has had 3-4 bouts of diarrhea for the past couple of days last at midnight

## 2015-09-27 NOTE — Progress Notes (Signed)
Received handover from ED physician Dr. Billy Fischer Patient is 82 year old female with a past medical history significant for CHF and COPD; who presents with three-day history of shortness of breath. Patient found with acute respiratory failure that appears to be multifactorial. She was last seen in the emergency department 3 days prior and discharged home with was thought to be a viral illness given prednisone. Patient reporting fevers up to 100.70F. When EMS arrived patient was found to be hypoxic for which they placed her on CPAP. Once in the emergency department patient was changed over to BiPAP. Physical exam revealed crackles and wheezes, blood pressure was elevated at 200s/100s which patient was started on a nitroglycerin drip with improvement ofblood pressures. Patient able to be transitioned off BiPAP to nasal cannula 6 L. Admit to stepdown

## 2015-09-27 NOTE — Progress Notes (Signed)
Antibiotic Renal Dose Adjustment per Rx  Pharmacy may adjust abx for renal function. Patient started on levofloxacin for COPD with sputum production. Given 1x dose of levofloxacin 500mg  this AM in the ED. CrCl~31.  Plan: Levofloxacin to 750mg  IV q48h F/u clinical progress, renal function, LOT  Elicia Lamp, PharmD, [facility] Clinical Pharmacist Pager [phone] 09/27/2015 10:47 AM

## 2015-09-27 NOTE — Progress Notes (Signed)
RT placed patient on BIPAP per MD verbal order. Patient came in respiratory distress. Patient placed on 18/8 and 50%. Patient tolerating well. RT will continue to monitor.

## 2015-09-27 NOTE — ED Provider Notes (Signed)
CSN: [id number]     Arrival date & time 09/27/15  0406 History   First MD Initiated Contact with Patient 09/27/15 [phone]     Chief Complaint  Patient presents with  . Respiratory Distress     (Consider location/radiation/quality/duration/timing/severity/associated sxs/prior Treatment) Patient is a 82 y.o. female presenting with shortness of breath.  Shortness of Breath Severity:  Severe Onset quality:  Sudden Duration:  1 hour (has been on and off for days, however episode woke her from sleep this AM) Timing:  Constant Progression:  Unchanged Chronicity:  New Context: not URI   Relieved by: home albuterol neb. Exacerbated by: laying down. Associated symptoms: cough, sputum production (yellow) and wheezing   Associated symptoms: no abdominal pain, no chest pain, no fever, no headaches, no neck pain, no rash, no sore throat and no syncope     Past Medical History  Diagnosis Date  . Allergic rhinitis, cause unspecified   . Unspecified cardiovascular disease   . Personal history of malignant neoplasm of breast   . Occlusion and stenosis of carotid artery without mention of cerebral infarction   . Chronic airway obstruction, not elsewhere classified   . Depressive disorder, not elsewhere classified   . Other dyspnea and respiratory abnormality   . Esophageal reflux   . Unspecified hearing loss   . Other and unspecified hyperlipidemia   . Unspecified essential hypertension   . Osteoporosis, unspecified   . Peripheral vascular disease, unspecified ([place])   . Unspecified urinary incontinence   . Spinal stenosis   . ACE-inhibitor cough   . Angina   . Heart murmur     aS CHILD  . Shortness of breath   . Recurrent upper respiratory infection (URI)   . Anxiety   . Pneumonia   . 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 Myocardial infarct ([place])   . Osteoarthrosis, unspecified whether generalized or localized, unspecified site   . Malignant neoplasm of breast  (female), unspecified site   . Compression fracture of T12 vertebra ([place]) 10/28/2011  . CHF (congestive heart failure) [facility])    Past Surgical History  Procedure Laterality Date  . Mastectomy  1987    Right  . Breast reconstruction  1998    Reconstruction   . Reduction mammaplasty  1998    Left  . Mastoidectomy  childhood  . Appendectomy  1948  . Lumbar laminectomy  2003  . Shoulder surgery  02/2005    Bilateral fractures with multiple surgeries  . Breast implant removal  06/12/09    right  . Abdominal hysterectomy    . Fracture surgery      fracture right elbow  . Coronary angioplasty  11/12    distal RCA  . Mastectomy    . Left heart catheterization with coronary angiogram N/A 06/05/2011    Procedure: LEFT HEART CATHETERIZATION WITH CORONARY ANGIOGRAM;  Surgeon: Clent Demark, MD;  Location: [facility] CATH LAB;  Service: Cardiovascular;  Laterality: N/A;  . Left heart catheterization with coronary angiogram N/A 03/05/2014    Procedure: LEFT HEART CATHETERIZATION WITH CORONARY ANGIOGRAM;  Surgeon: Clent Demark, MD;  Location: [facility] CATH LAB;  Service: Cardiovascular;  Laterality: N/A;  . Flexible sigmoidoscopy N/A 01/21/2015    Procedure: FLEXIBLE SIGMOIDOSCOPY;  Surgeon: Richmond Campbell, MD;  Location: [facility] ENDOSCOPY;  Service: Endoscopy;  Laterality: N/A;  . Flexible sigmoidoscopy  01/22/2015       . Cardiac catheterization N/A 01/25/2015    Procedure: Left Heart Cath  and Coronary Angiography;  Surgeon: Charolette Forward, MD;  Location: [place] CV LAB;  Service: Cardiovascular;  Laterality: N/A;   Family History  Problem Relation Age of Onset  . Heart attack Father 62  . Cancer Mother     ovarian  . Cancer Brother     lung  . Arthritis      family   Social History  Substance Use Topics  . Smoking status: Former Smoker    Quit date: 07/13/1974  . Smokeless tobacco: Never Used  . Alcohol Use: 0.0 oz/week    0 Standard drinks or equivalent per week     Comment: occasional   OB  History    No data available     Review of Systems  Constitutional: Negative for fever and chills.  HENT: Positive for rhinorrhea. Negative for sore throat.   Eyes: Negative for visual disturbance.  Respiratory: Positive for cough, sputum production (yellow), shortness of breath and wheezing.   Cardiovascular: Negative for chest pain, leg swelling and syncope.  Gastrointestinal: Negative for nausea and abdominal pain.  Genitourinary: Negative for difficulty urinating.  Musculoskeletal: Negative for back pain and neck pain.  Skin: Negative for rash.  Neurological: Negative for syncope and headaches.      Allergies  Amoxicillin-pot clavulanate; Cephalexin; Clarithromycin; Lidocaine; Nifedipine; Nsaids; Olmesartan medoxomil; Penicillins; and Tramadol hcl  Home Medications   Prior to Admission medications   Medication Sig Start Date End Date Taking? Authorizing Provider  acetaminophen (TYLENOL) 325 MG tablet Take 2 tablets (650 mg total) by mouth every 4 (four) hours as needed for headache or mild pain. 01/27/15   Bonnielee Haff, MD  albuterol (PROVENTIL) (2.5 MG/3ML) 0.083% nebulizer solution Take 3 mLs (2.5 mg total) by nebulization every 6 (six) hours as needed for wheezing or shortness of breath. 02/18/15   Venia Carbon, MD  aspirin 81 MG tablet Take 81 mg by mouth daily after lunch.     Historical Provider, MD  Calcium Carbonate-Vitamin D (CALTRATE 600+D PO) Take 1 tablet by mouth daily after lunch.     Historical Provider, MD  carvedilol (COREG) 12.5 MG tablet Take 1 tablet (12.5 mg total) by mouth 2 (two) times daily with a meal. Patient taking differently: Take 6.25 mg by mouth 2 (two) times daily with a meal.  09/13/15   Venia Carbon, MD  cetirizine (ZYRTEC) 10 MG tablet Take 10 mg by mouth daily. For allergies    Historical Provider, MD  denosumab (PROLIA) 60 MG/ML SOLN injection Inject 60 mg into the skin every 6 (six) months. Administer in upper arm, thigh, or abdomen     Historical Provider, MD  escitalopram (LEXAPRO) 10 MG tablet Take 1 tablet by mouth  daily 08/26/15   Venia Carbon, MD  famotidine (PEPCID) 20 MG tablet Take 1 tablet (20 mg total) by mouth 2 (two) times daily. 09/13/15   Venia Carbon, MD  fluticasone (FLONASE) 50 MCG/ACT nasal spray Place 2 sprays into both nostrils daily. 09/13/15   Venia Carbon, MD  furosemide (LASIX) 40 MG tablet Take 1 tablet by mouth  daily 08/26/15   Venia Carbon, MD  gabapentin (NEURONTIN) 100 MG capsule Take 1 capsule (100 mg total) by mouth 2 (two) times daily. 07/23/14   Venia Carbon, MD  losartan (COZAAR) 50 MG tablet TAKE 1 TABLET BY MOUTH DAILY 09/13/15   Venia Carbon, MD  montelukast (SINGULAIR) 10 MG tablet TAKE 1 TABLET BY MOUTH DAILY 09/13/15   Richard  Felipa Furnace, MD  Multiple Vitamin (MULITIVITAMIN WITH MINERALS) TABS Take 1 tablet by mouth daily after lunch.     Historical Provider, MD  oxyCODONE-acetaminophen (PERCOCET/ROXICET) 5-325 MG per tablet Take 1-2 tablets by mouth every 4 (four) hours as needed. 01/28/15   Bonnielee Haff, MD  pravastatin (PRAVACHOL) 80 MG tablet Take 1 tablet by mouth  daily 05/09/15   Venia Carbon, MD  predniSONE (DELTASONE) 20 MG tablet Take 3 tablets (60 mg total) by mouth daily. 09/25/15   [name], MD  spironolactone (ALDACTONE) 25 MG tablet Take 1 tablet by mouth  daily 08/26/15   Venia Carbon, MD  vitamin B-12 (CYANOCOBALAMIN) 500 MCG tablet Take 1,000 mcg by mouth 3 (three) times a week. Mon, Wed, Fri    Historical Provider, MD   BP 114/60 mmHg  Pulse 63  Temp(Src) 96.3 F (35.7 C) (Axillary)  Resp 18  Ht 5\' 4"  (1.626 m)  Wt 172 lb (78.019 kg)  BMI 29.51 kg/m2  SpO2 99% Physical Exam  Constitutional: She is oriented to person, place, and time. She appears well-developed and well-nourished. No distress.  HENT:  Head: Normocephalic and atraumatic.  Eyes: Conjunctivae and EOM are normal.  Neck: Normal range of motion.  Cardiovascular: Normal rate,  regular rhythm, normal heart sounds and intact distal pulses.  Exam reveals no gallop and no friction rub.   No murmur heard. Pulmonary/Chest: Tachypnea noted. She is in respiratory distress. She has wheezes (diffuse). She has rales (diffuse).  Abdominal: Soft. She exhibits no distension. There is no tenderness. There is no guarding.  Musculoskeletal: She exhibits no edema or tenderness.  Neurological: She is alert and oriented to person, place, and time.  Skin: Skin is warm and dry. No rash noted. She is not diaphoretic. No erythema.  Nursing note and vitals reviewed.   ED Course  Procedures (including critical care time) Labs Review Labs Reviewed  CBC WITH DIFFERENTIAL/PLATELET - Abnormal; Notable for the following:    RBC 3.60 (*)    Hemoglobin 11.3 (*)    HCT 34.8 (*)    Lymphs Abs 0.6 (*)    All other components within normal limits  BRAIN NATRIURETIC PEPTIDE - Abnormal; Notable for the following:    B Natriuretic Peptide 444.1 (*)    All other components within normal limits  COMPREHENSIVE METABOLIC PANEL - Abnormal; Notable for the following:    Sodium 127 (*)    Potassium 5.2 (*)    Chloride 91 (*)    Glucose, Bld 223 (*)    BUN 34 (*)    Creatinine, Ser 1.34 (*)    Calcium 8.5 (*)    Total Protein 6.3 (*)    AST 47 (*)    GFR calc non Af Amer 35 (*)    GFR calc Af Amer 41 (*)    All other components within normal limits  I-STAT VENOUS BLOOD GAS, ED - Abnormal; Notable for the following:    pH, Ven 7.334 (*)    Bicarbonate 26.3 (*)    All other components within normal limits  CULTURE, BLOOD (ROUTINE X 2)  CULTURE, BLOOD (ROUTINE X 2)  HEMOGLOBIN A1C  INFLUENZA PANEL BY PCR (TYPE A & B, H1N1)  I-STAT TROPOININ, ED  I-STAT CG4 LACTIC ACID, ED  I-STAT TROPOININ, ED  I-STAT CG4 LACTIC ACID, ED    Imaging Review Dg Chest Portable 1 View  09/27/2015  CLINICAL DATA:  Shortness of breath EXAM: PORTABLE CHEST 1 VIEW COMPARISON:  09/24/2015 FINDINGS:  There is  symmetric and diffuse interstitial coarsening above baseline. Chronic hyperinflation and cardiomegaly. Negative aortic and hilar contours. No effusion or air leak. IMPRESSION: Progressive interstitial coarsening which could be bronchitic or congestive. Electronically Signed   By: Monte Fantasia M.D.   On: 09/27/2015 04:41   I have personally reviewed and evaluated these images and lab results as part of my medical decision-making.   EKG Interpretation   Date/Time:  Friday September 27 2015 04:16:05 EDT Ventricular Rate:  98 PR Interval:  189 QRS Duration: 108 QT Interval:  339 QTC Calculation: 433 R Axis:   -83 Text Interpretation:  Sinus tachycardia Ventricular premature complex  Lateral infarct, recent Anterior infarct, old No significant change since  last tracing Confirmed by [facility] MD, [place] ([id number]) on 09/27/2015 8:25:15  AM      CRITICAL CARE Performed by: Alvino Chapel   Total critical care time: 30 minutes  Critical care time was exclusive of separately billable procedures and treating other patients.  Critical care was necessary to treat or prevent imminent or life-threatening deterioration.  Critical care was time spent personally by me on the following activities: development of treatment plan with patient and/or surrogate as well as nursing, discussions with consultants, evaluation of patient's response to treatment, examination of patient, obtaining history from patient or surrogate, ordering and performing treatments and interventions, ordering and review of laboratory studies, ordering and review of radiographic studies, pulse oximetry and re-evaluation of patient's condition.   MDM   Final diagnoses:  COPD with acute bronchitis ([place])  Acute respiratory failure with hypoxia ([place])  Acute hyperkalemia   82 year old female with a history of CHF, COPD, coronary artery disease presents with concern for shortness of breath. Patient was seen in the emergency  department 3 days ago for dyspnea, was diagnosed with COPD exacerbation likely secondary to a viral illness with a temperature of 100.8. Patient reports that since that time, her fever has stopped, and she's had intermittent shortness of breath and continuing cough. This morning she became more acutely dyspneic, describes orthopnea, and on EMS arrival her oxygen saturation is her 60% on room air, and her blood pressures were 200s over 120s. EMS gave her 4 tablets of sublingual nitroglycerin, and placed her on CPAP.  Patient transferred to BiPAP on arrival to the ED.  Patient with wheezes and crackles on exam. Blood pressures decreased on arrival, and continued to trend downwards in the ED, likely secondary to nitro received with EMS. Patient without fever, denies continuing chills, no sign of pneumonia on CXR, no leukocytosis, and doubt sepsis.  Mild interstitial edema, and orthopnea, however BNP decreased from prior, and not clearly CHF exacerbation.    Respiratory status improved with continuous albuterol/atrovent, solumedrol, and patient able to be weaned to 6L pNC.  Lower suspicion at this time for PE given some interstitial edema, and exam consistent with COPD exacerbation.  Patient given levaquin.   Labs show mild hyperkalemia, no signs of ECG changes.  Hyponatremia 127.  Patient to be admitted to Step Down for further care for acute hypoxic respiratory failure, likely secondary to mixed CHF (acute pulmonary edema with hypertension) and COPD exacerbation.      Gareth Morgan, MD 09/27/15 [phone]

## 2015-09-27 NOTE — ED Notes (Signed)
Daughter states her BP goes up and down with her COPD

## 2015-09-27 NOTE — H&P (Signed)
[facility] History and Physical                                                                                    Haley Harris, is a 82 y.o. female  MRN: [id number]   DOB - 12/28/1928  Admit Date - 09/27/2015  Outpatient Primary MD for the patient is [name], MD  Referring MD: Billy Fischer / ER  PMH: Past Medical History  Diagnosis Date  . Allergic rhinitis, cause unspecified   . Unspecified cardiovascular disease   . Personal history of malignant neoplasm of breast   . Occlusion and stenosis of carotid artery without mention of cerebral infarction   . Chronic airway obstruction, not elsewhere classified   . Depressive disorder, not elsewhere classified   . Other dyspnea and respiratory abnormality   . Esophageal reflux   . Unspecified hearing loss   . Other and unspecified hyperlipidemia   . Unspecified essential hypertension   . Osteoporosis, unspecified   . Peripheral vascular disease, unspecified ([place])   . Unspecified urinary incontinence   . Spinal stenosis   . ACE-inhibitor cough   . Angina   . Heart murmur     aS CHILD  . Shortness of breath   . Recurrent upper respiratory infection (URI)   . Anxiety   . Pneumonia   . 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 Myocardial infarct ([place])   . Osteoarthrosis, unspecified whether generalized or localized, unspecified site   . Malignant neoplasm of breast (female), unspecified site   . Compression fracture of T12 vertebra ([place]) 10/28/2011  . CHF (congestive heart failure) (H[facility])       PSH: Past Surgical History  Procedure Laterality Date  . Mastectomy  1987    Right  . Breast reconstruction  1998    Reconstruction   . Reduction mammaplasty  1998    Left  . Mastoidectomy  childhood  . Appendectomy  1948  . Lumbar laminectomy  2003  . Shoulder surgery  02/2005    Bilateral fractures with multiple surgeries  . Breast implant removal  06/12/09    right  . Abdominal hysterectomy    .  Fracture surgery      fracture right elbow  . Coronary angioplasty  11/12    distal RCA  . Mastectomy    . Left heart catheterization with coronary angiogram N/A 06/05/2011    Procedure: LEFT HEART CATHETERIZATION WITH CORONARY ANGIOGRAM;  Surgeon: Clent Demark, MD;  Location: [facility] CATH LAB;  Service: Cardiovascular;  Laterality: N/A;  . Left heart catheterization with coronary angiogram N/A 03/05/2014    Procedure: LEFT HEART CATHETERIZATION WITH CORONARY ANGIOGRAM;  Surgeon: Clent Demark, MD;  Location: [facility] CATH LAB;  Service: Cardiovascular;  Laterality: N/A;  . Flexible sigmoidoscopy N/A 01/21/2015    Procedure: FLEXIBLE SIGMOIDOSCOPY;  Surgeon: Richmond Campbell, MD;  Location: [facility] ENDOSCOPY;  Service: Endoscopy;  Laterality: N/A;  . Flexible sigmoidoscopy  01/22/2015       . Cardiac catheterization N/A 01/25/2015    Procedure: Left Heart Cath and Coronary Angiography;  Surgeon: Charolette Forward, MD;  Location: [place] [facility] CV LAB;  Service: Cardiovascular;  Laterality: N/A;     CC:  Chief Complaint  Patient presents with  . Respiratory Distress     HPI: 82 year old female patient with history of CAD with prior stents to the LAD and RCA, combined chronic diastolic and systolic heart failure with EF 45-50% left heart catheterization July 2016, dyslipidemia, diabetes, peripheral vascular disease and hypertension. Patient also has a 3 cm benign-appearing adrenal mass as an incidental finding on July MRI when she was admitted for a colonic bowel obstruction. She also has stage III chronic kidney disease as well as COPD. Patient was initially evaluated in the ER on 3/14 with cough and upper respiratory symptoms that have become at night. She was treated as COPD exacerbation with bronchitis and started on pulse dose steroid prednisone 60 mg daily for 5 days and was advised to continue her home albuterol nebs. CT of the chest done at that time as negative for pneumonia showed chronic basilar  atelectasis and trace effusions. Unfortunately patient's fevers and respiratory symptoms have progressed she is now having orthopnea and dyspnea on exertion without weight gain and her sputum has changed from clear high volume to a thicker yellow consistency. Because of significant respiratory symptoms EMS was called to the home and patient was transported to the ER. In addition to the above respiratory symptoms she's had persistent paroxysmal coughing and has had several episodes of diarrhea since being seen in the ER on 3/14. She also reports increasing dyspnea on exertion and new orthopnea  ER Evaluation and treatment: Upon arrival to the ER and in the nursing progress Notes room air saturations were 60% improved to [id number] after application of CPAP, pulse was 116 and initial blood pressure was 200/120. Subsequently patient's blood pressure has improved to 100/69, pulse 87, respirations 22 and stable nasal cannula oxygen 4-6 L EKG: Sinus tachycardia with ventricular rate 98-100 bpm, QTC 433 ms, nonspecific interventricular conduction delay with incomplete right bundle branch block PCXR: Progressive interstitial coarsening which can be bronchitic congested in nature-upon my evaluation of x-ray films patient has done is consistent with interstitial pneumonitis and likely superimposed heart failure Laboratory data: Na 127, K 5.2, Cl 91, BUN 34, Cr 1.34, glucose 223, BNP 444, troponin 0.04, lactic acid 1.55 with follow-up 0.91, WBC 8500 with neutrophils 85% with normal absolute neutrophils, hemoglobin 11.3, blood cultures obtained in the ER Proventil nebulizer 1 Atrovent nebulizer 1 Solu-Medrol 125 mg IV 1 Levaquin 500 mg IV 1  Review of Systems   In addition to the HPI above,  No chills No Headache, changes with Vision or hearing, new weakness, tingling, numbness in any extremity, No problems swallowing food or Liquids, indigestion/reflux No Chest pain, Cough or Shortness of Breath, palpitations,  orthopnea or DOE No Abdominal pain, N/V,  melena or hematochezia, no dark tarry stools No dysuria, hematuria or flank pain No new skin rashes, lesions, masses or bruises, No new joints pains-aches No recent weight gain or loss No polyuria, polydypsia or polyphagia,  *A full 10 point Review of Systems was done, except as stated above, all other Review of Systems were negative.  Social History Social History  Substance Use Topics  . Smoking status: Former Smoker    Quit date: 07/13/1974  . Smokeless tobacco: Never Used  . Alcohol Use: 0.0 oz/week    0 Standard drinks or equivalent per week     Comment: occasional    Resides at: Private residence  Lives with: Alone  Ambulatory status: Without assistive devices  Family History Family History  Problem Relation Age of Onset  . Heart attack Father 35  . Cancer Mother     ovarian  . Cancer Brother     lung  . Arthritis      family     Prior to Admission medications   Medication Sig Start Date End Date Taking? Authorizing Provider  acetaminophen (TYLENOL) 325 MG tablet Take 2 tablets (650 mg total) by mouth every 4 (four) hours as needed for headache or mild pain. 01/27/15   Bonnielee Haff, MD  albuterol (PROVENTIL) (2.5 MG/3ML) 0.083% nebulizer solution Take 3 mLs (2.5 mg total) by nebulization every 6 (six) hours as needed for wheezing or shortness of breath. 02/18/15   Venia Carbon, MD  aspirin 81 MG tablet Take 81 mg by mouth daily after lunch.     Historical Provider, MD  Calcium Carbonate-Vitamin D (CALTRATE 600+D PO) Take 1 tablet by mouth daily after lunch.     Historical Provider, MD  carvedilol (COREG) 12.5 MG tablet Take 1 tablet (12.5 mg total) by mouth 2 (two) times daily with a meal. Patient taking differently: Take 6.25 mg by mouth 2 (two) times daily with a meal.  09/13/15   Venia Carbon, MD  cetirizine (ZYRTEC) 10 MG tablet Take 10 mg by mouth daily. For allergies    Historical Provider, MD  denosumab  (PROLIA) 60 MG/ML SOLN injection Inject 60 mg into the skin every 6 (six) months. Administer in upper arm, thigh, or abdomen    Historical Provider, MD  escitalopram (LEXAPRO) 10 MG tablet Take 1 tablet by mouth  daily 08/26/15   Venia Carbon, MD  famotidine (PEPCID) 20 MG tablet Take 1 tablet (20 mg total) by mouth 2 (two) times daily. 09/13/15   Venia Carbon, MD  fluticasone (FLONASE) 50 MCG/ACT nasal spray Place 2 sprays into both nostrils daily. 09/13/15   Venia Carbon, MD  furosemide (LASIX) 40 MG tablet Take 1 tablet by mouth  daily 08/26/15   Venia Carbon, MD  gabapentin (NEURONTIN) 100 MG capsule Take 1 capsule (100 mg total) by mouth 2 (two) times daily. 07/23/14   Venia Carbon, MD  losartan (COZAAR) 50 MG tablet TAKE 1 TABLET BY MOUTH DAILY 09/13/15   Venia Carbon, MD  montelukast (SINGULAIR) 10 MG tablet TAKE 1 TABLET BY MOUTH DAILY 09/13/15   Venia Carbon, MD  Multiple Vitamin (MULITIVITAMIN WITH MINERALS) TABS Take 1 tablet by mouth daily after lunch.     Historical Provider, MD  oxyCODONE-acetaminophen (PERCOCET/ROXICET) 5-325 MG per tablet Take 1-2 tablets by mouth every 4 (four) hours as needed. 01/28/15   Bonnielee Haff, MD  pravastatin (PRAVACHOL) 80 MG tablet Take 1 tablet by mouth  daily 05/09/15   Venia Carbon, MD  predniSONE (DELTASONE) 20 MG tablet Take 3 tablets (60 mg total) by mouth daily. 09/25/15   [name], MD  spironolactone (ALDACTONE) 25 MG tablet Take 1 tablet by mouth  daily 08/26/15   Venia Carbon, MD  vitamin B-12 (CYANOCOBALAMIN) 500 MCG tablet Take 1,000 mcg by mouth 3 (three) times a week. Mon, Wed, Fri    Historical Provider, MD    Allergies  Allergen Reactions  . Amoxicillin-Pot Clavulanate Anaphylaxis  . Cephalexin Other (See Comments)    Reaction unknown  . Clarithromycin Other (See Comments)    Reaction unknown  . Lidocaine   . Nifedipine Other (See Comments)    Reaction unknown  . Nsaids Other (  See Comments)    Heart  issue  . Olmesartan Medoxomil     REACTION: cough;  But tolerating Losartan 02/2014  . Penicillins   . Tramadol Hcl Other (See Comments)    Reaction unknown    Physical Exam  Vitals  Blood pressure 114/60, pulse 63, temperature 96.3 F (35.7 C), temperature source Axillary, resp. rate 18, height 5\' 4"  (1.626 m), weight 172 lb (78.019 kg), SpO2 98 %.   General:  In no acute distress, appears healthy and well nourished;Stated age  Psych:  Normal affect, Denies Suicidal or Homicidal ideations, Awake Alert, Oriented X 3. Speech and thought patterns are clear and appropriate, no apparent short term memory deficits  Neuro:   No focal neurological deficits, CN II through XII intact, Strength 5/5 all 4 extremities, Sensation intact all 4 extremities.  ENT:  Ears and Eyes appear Normal, Conjunctivae clear, PER. Moist oral mucosa without erythema or exudates.  Neck:  Supple, No lymphadenopathy appreciated  Respiratory:  Symmetrical chest wall movement, Good air movement bilaterally without wheezing, coarse bilateral crackles. Her liters oxygen  Cardiac:  RRR, No Murmurs, no LE edema noted, no JVD, No carotid bruits, peripheral pulses palpable at 2+  Abdomen:  Positive bowel sounds, Soft, Non tender, Non distended,  No masses appreciated, no obvious hepatosplenomegaly  Skin:  No Cyanosis, Normal Skin Turgor, No Skin Rash or Bruise.  Extremities: Symmetrical without obvious trauma or injury,  no effusions.  Data Review  CBC  Recent Labs Lab 09/24/15 1822 09/27/15 0435  WBC 5.3 8.5  HGB 11.4* 11.3*  HCT 35.0* 34.8*  PLT 181 215  MCV 98.6 96.7  MCH 32.1 31.4  MCHC 32.6 32.5  RDW 12.8 12.6  LYMPHSABS 0.6* 0.6*  MONOABS 0.5 0.7  EOSABS 0.1 0.0  BASOSABS 0.0 0.0    Chemistries   Recent Labs Lab 09/24/15 1822 09/27/15 0435  NA 132* 127*  K 4.6 5.2*  CL 93* 91*  CO2 26 22  GLUCOSE 172* 223*  BUN 26* 34*  CREATININE 1.26* 1.34*  CALCIUM 8.8* 8.5*  AST  --  47*  ALT   --  17  ALKPHOS  --  48  BILITOT  --  1.1    estimated creatinine clearance is 31.6 mL/min (by C-G formula based on Cr of 1.34).  No results for input(s): TSH, T4TOTAL, T3FREE, THYROIDAB in the last 72 hours.  Invalid input(s): FREET3  Coagulation profile No results for input(s): INR, PROTIME in the last 168 hours.  No results for input(s): DDIMER in the last 72 hours.  Cardiac Enzymes No results for input(s): CKMB, TROPONINI, MYOGLOBIN in the last 168 hours.  Invalid input(s): CK  Invalid input(s): POCBNP  Urinalysis    Component Value Date/Time   COLORURINE AMBER* 01/18/2015 1312   APPEARANCEUR CLEAR 01/18/2015 1312   LABSPEC 1.018 01/18/2015 1312   PHURINE 5.0 01/18/2015 1312   GLUCOSEU NEGATIVE 01/18/2015 1312   HGBUR NEGATIVE 01/18/2015 1312   BILIRUBINUR MODERATE* 01/18/2015 1312   BILIRUBINUR neg 08/05/2011 1119   KETONESUR 40* 01/18/2015 1312   KETONESUR negative 10/23/2011 1633   PROTEINUR NEGATIVE 01/18/2015 1312   PROTEINUR neg 10/23/2011 1633   UROBILINOGEN 1.0 01/18/2015 1312   UROBILINOGEN negative 10/23/2011 1633   NITRITE NEGATIVE 01/18/2015 1312   NITRITE neg 10/23/2011 1633   LEUKOCYTESUR NEGATIVE 01/18/2015 1312    Imaging results:   Dg Chest 2 View  09/24/2015  CLINICAL DATA:  Congestion, cough and exertional shortness breath for 3 days. History of  CHF. EXAM: CHEST  2 VIEW COMPARISON:  Chest x-rays dated 01/26/2015 and 03/01/2014. FINDINGS: Mild cardiomegaly is stable. Overall cardiomediastinal silhouette is stable in size and configuration. Prominence of the pulmonary arteries suggests chronic pulmonary artery hypertension. Coarse interstitial markings are again seen within each lung, similar to the previous exam of 01/26/2015. No evidence of a superimposed interstitial edema on today's exam, as was seen on earlier chest x-ray of 01/18/2015. No confluent airspace opacity to suggest a developing pneumonia. No pleural effusions seen. Degenerative  changes are again noted throughout the kyphotic thoracic spine. Multiple chronic compression fracture deformities again appreciated within the thoracic spine. No acute- appearing osseous abnormality. IMPRESSION: Stable chest x-ray. No evidence of acute cardiopulmonary abnormality. Stable cardiomegaly. Evidence of chronic interstitial lung disease. Evidence of chronic pulmonary artery hypertension. Multiple old compression fracture deformities within thoracic spine. Electronically Signed   By: Franki Cabot M.D.   On: 09/24/2015 19:02   Ct Chest Wo Contrast  09/25/2015  CLINICAL DATA:  Fever and productive cough. Isolated left upper lobe wheezing. Evaluate for pneumonia EXAM: CT CHEST WITHOUT CONTRAST TECHNIQUE: Multidetector CT imaging of the chest was performed following the standard protocol without IV contrast. COMPARISON:  01/19/2015 FINDINGS: THORACIC INLET/BODY WALL: Right mastectomy. MEDIASTINUM: Chronic cardiomegaly with trace pericardial fluid or thickening. No evidence of acute vascular disease. Diffuse atherosclerosis, including the coronary arteries. No adenopathy. Negative esophagus. LUNG WINDOWS: Mild dependent atelectasis or scar. Trace pleural effusions. Lungs have a stable appearance since 2016. There is no edema, consolidation, effusion, or pneumothorax. UPPER ABDOMEN: Chronic 3 cm right adrenal mass, benign given stability and attributed to adenoma. Benign hepatic low densities which are small. OSSEOUS: Remote compression fractures at T1, T6, T8, and T12. The T6 and T12 fractures have advanced height loss with vertebra plana. No acute osseous finding. IMPRESSION: 1. Negative for pneumonia.  Stable exam compared to July 2016. 2. Chronic basilar atelectasis/ scarring and trace effusions. Electronically Signed   By: Monte Fantasia M.D.   On: 09/25/2015 02:30   Dg Chest Portable 1 View  09/27/2015  CLINICAL DATA:  Shortness of breath EXAM: PORTABLE CHEST 1 VIEW COMPARISON:  09/24/2015 FINDINGS:  There is symmetric and diffuse interstitial coarsening above baseline. Chronic hyperinflation and cardiomegaly. Negative aortic and hilar contours. No effusion or air leak. IMPRESSION: Progressive interstitial coarsening which could be bronchitic or congestive. Electronically Signed   By: Monte Fantasia M.D.   On: 09/27/2015 04:41     EKG: (Independently reviewed)  Sinus tachycardia with ventricular rate 98-100 bpm, QTC 433 ms, nonspecific interventricular conduction delay with incomplete right bundle branch block   Assessment & Plan  Principal Problem:   Acute respiratory failure with hypoxia:   A) Acute on chronic combined systolic and diastolic CHF, NYHA class 1    B) COPD with acute bronchitis -Initial illness appears to have started as COPD bronchitis with the patient now having orthopnea and dyspnea on exertion; she states that typically with previous heart failure exacerbations for weight has remained stable -Admit to telemetry/Obs -Continue Levaquin for suspected infectious component-of note no focal infiltrate on chest x-ray -Check influenza PCR, blood cultures, Legionella antigen, strep antigen and sputum culture -Lasix 60 mg IV 1 and if blood pressure can tolerate would give at least 1 more dose later today -Supportive care with oxygen, DuoNeb as well as scheduled budesonide -Change pulse dose prednisone 60 mg daily 2 Solu-Medrol 40 mg IV every 12 hours -Daily weights with strict I/O  Active Problems:   Hyponatremia -  Has a chronic component and sending of ongoing diuretic usage -Acute component likely combination of volume overload as well as pseudohyponatremia from mild to moderate hyperglycemia -Follow labs    CAD s/p stents LAD/RCA -Currently asymptomatic -Continue preadmission aspirin, statin and beta blocker -EKG and troponin are reassuring    AKI on CKD, stage III/acute hyperkalemia -ACE line renal function in July was BUN 17 creatinine 1.3 but since initiation of  diuretics BUN has averaged between 32 and 39 with a creatinine between 1.26 and 1.57 -Suspect renal function will improve with diuresis -Because of relative hyperkalemia will hold Aldactone initially and reevaluate -Hold ARB inhibitor initially as well because of renal insufficiency    Dyslipidemia -Continue Pravachol    Hyperglycemia -Was not on medications prior to admission and does not carry a formal diagnosis of diabetes -likely related to preadmission prednisone therefore steroid induced -Follow CBGs and provide SSI -HgbA1c    Neuropathy due to peripheral vascular disease  -Continue preadmission medications -No reported claudication symptomatology    Right adrenal mass  -Per primary care physician's visit on 04/12/15 it was felt that the abnormality seen on MRI was a benign adenoma and according to the documentation patient "doesn't want to look into this anymore"    DVT Prophylaxis: Lovenox  Family Communication:  Daughter bedside   Code Status:  Full code  Condition:  Stable  Discharge disposition: Suspect discharge back to previous home environment when medically stable  Time spent in minutes : 60      ELLIS,ALLISON L. ANP on 09/27/2015 at 8:08 AM  You may contact me by going to [email or web address] - password [email or web address]  I am available from 7a-7p but please confirm I am on the schedule by going to Amion as above.   After 7p please contact night coverage person covering me after hours  [facility]

## 2015-09-27 NOTE — ED Notes (Signed)
Patient removed from BiPap and placed on [place] 4l humidified

## 2015-09-27 NOTE — ED Notes (Signed)
Admitting provider at bedside to eval pt

## 2015-09-28 ENCOUNTER — Observation Stay (HOSPITAL_COMMUNITY): Payer: Medicare Other

## 2015-09-28 DIAGNOSIS — J208 Acute bronchitis due to other specified organisms: Secondary | ICD-10-CM | POA: Diagnosis not present

## 2015-09-28 DIAGNOSIS — H9190 Unspecified hearing loss, unspecified ear: Secondary | ICD-10-CM | POA: Diagnosis present

## 2015-09-28 DIAGNOSIS — N183 Chronic kidney disease, stage 3 (moderate): Secondary | ICD-10-CM | POA: Diagnosis not present

## 2015-09-28 DIAGNOSIS — E559 Vitamin D deficiency, unspecified: Secondary | ICD-10-CM | POA: Diagnosis present

## 2015-09-28 DIAGNOSIS — I5020 Unspecified systolic (congestive) heart failure: Secondary | ICD-10-CM | POA: Diagnosis not present

## 2015-09-28 DIAGNOSIS — I252 Old myocardial infarction: Secondary | ICD-10-CM | POA: Diagnosis not present

## 2015-09-28 DIAGNOSIS — J309 Allergic rhinitis, unspecified: Secondary | ICD-10-CM | POA: Diagnosis present

## 2015-09-28 DIAGNOSIS — E279 Disorder of adrenal gland, unspecified: Secondary | ICD-10-CM | POA: Diagnosis not present

## 2015-09-28 DIAGNOSIS — M1990 Unspecified osteoarthritis, unspecified site: Secondary | ICD-10-CM | POA: Diagnosis present

## 2015-09-28 DIAGNOSIS — Z7951 Long term (current) use of inhaled steroids: Secondary | ICD-10-CM | POA: Diagnosis not present

## 2015-09-28 DIAGNOSIS — Z8673 Personal history of transient ischemic attack (TIA), and cerebral infarction without residual deficits: Secondary | ICD-10-CM | POA: Diagnosis not present

## 2015-09-28 DIAGNOSIS — Z7952 Long term (current) use of systemic steroids: Secondary | ICD-10-CM | POA: Diagnosis not present

## 2015-09-28 DIAGNOSIS — Z955 Presence of coronary angioplasty implant and graft: Secondary | ICD-10-CM | POA: Diagnosis not present

## 2015-09-28 DIAGNOSIS — E860 Dehydration: Secondary | ICD-10-CM | POA: Diagnosis not present

## 2015-09-28 DIAGNOSIS — K219 Gastro-esophageal reflux disease without esophagitis: Secondary | ICD-10-CM | POA: Diagnosis not present

## 2015-09-28 DIAGNOSIS — I2510 Atherosclerotic heart disease of native coronary artery without angina pectoris: Secondary | ICD-10-CM | POA: Diagnosis not present

## 2015-09-28 DIAGNOSIS — E785 Hyperlipidemia, unspecified: Secondary | ICD-10-CM | POA: Diagnosis not present

## 2015-09-28 DIAGNOSIS — E871 Hypo-osmolality and hyponatremia: Secondary | ICD-10-CM | POA: Diagnosis not present

## 2015-09-28 DIAGNOSIS — J441 Chronic obstructive pulmonary disease with (acute) exacerbation: Secondary | ICD-10-CM | POA: Diagnosis not present

## 2015-09-28 DIAGNOSIS — Z7722 Contact with and (suspected) exposure to environmental tobacco smoke (acute) (chronic): Secondary | ICD-10-CM | POA: Diagnosis not present

## 2015-09-28 DIAGNOSIS — J209 Acute bronchitis, unspecified: Secondary | ICD-10-CM | POA: Diagnosis not present

## 2015-09-28 DIAGNOSIS — I130 Hypertensive heart and chronic kidney disease with heart failure and stage 1 through stage 4 chronic kidney disease, or unspecified chronic kidney disease: Secondary | ICD-10-CM | POA: Diagnosis not present

## 2015-09-28 DIAGNOSIS — R05 Cough: Secondary | ICD-10-CM | POA: Diagnosis not present

## 2015-09-28 DIAGNOSIS — N179 Acute kidney failure, unspecified: Secondary | ICD-10-CM | POA: Diagnosis not present

## 2015-09-28 DIAGNOSIS — J440 Chronic obstructive pulmonary disease with acute lower respiratory infection: Secondary | ICD-10-CM | POA: Diagnosis not present

## 2015-09-28 DIAGNOSIS — R0602 Shortness of breath: Secondary | ICD-10-CM | POA: Diagnosis not present

## 2015-09-28 DIAGNOSIS — R509 Fever, unspecified: Secondary | ICD-10-CM | POA: Diagnosis not present

## 2015-09-28 DIAGNOSIS — Z7982 Long term (current) use of aspirin: Secondary | ICD-10-CM | POA: Diagnosis not present

## 2015-09-28 DIAGNOSIS — J9600 Acute respiratory failure, unspecified whether with hypoxia or hypercapnia: Secondary | ICD-10-CM | POA: Diagnosis not present

## 2015-09-28 DIAGNOSIS — E1122 Type 2 diabetes mellitus with diabetic chronic kidney disease: Secondary | ICD-10-CM | POA: Diagnosis present

## 2015-09-28 DIAGNOSIS — M810 Age-related osteoporosis without current pathological fracture: Secondary | ICD-10-CM | POA: Diagnosis not present

## 2015-09-28 DIAGNOSIS — M4800 Spinal stenosis, site unspecified: Secondary | ICD-10-CM | POA: Diagnosis present

## 2015-09-28 DIAGNOSIS — E1165 Type 2 diabetes mellitus with hyperglycemia: Secondary | ICD-10-CM | POA: Diagnosis present

## 2015-09-28 DIAGNOSIS — I25111 Atherosclerotic heart disease of native coronary artery with angina pectoris with documented spasm: Secondary | ICD-10-CM | POA: Diagnosis not present

## 2015-09-28 DIAGNOSIS — Z87891 Personal history of nicotine dependence: Secondary | ICD-10-CM | POA: Diagnosis not present

## 2015-09-28 DIAGNOSIS — E1151 Type 2 diabetes mellitus with diabetic peripheral angiopathy without gangrene: Secondary | ICD-10-CM | POA: Diagnosis present

## 2015-09-28 DIAGNOSIS — E875 Hyperkalemia: Secondary | ICD-10-CM | POA: Diagnosis not present

## 2015-09-28 DIAGNOSIS — J9691 Respiratory failure, unspecified with hypoxia: Secondary | ICD-10-CM | POA: Diagnosis not present

## 2015-09-28 DIAGNOSIS — Z853 Personal history of malignant neoplasm of breast: Secondary | ICD-10-CM | POA: Diagnosis not present

## 2015-09-28 DIAGNOSIS — J9601 Acute respiratory failure with hypoxia: Secondary | ICD-10-CM | POA: Diagnosis not present

## 2015-09-28 DIAGNOSIS — G629 Polyneuropathy, unspecified: Secondary | ICD-10-CM | POA: Diagnosis not present

## 2015-09-28 DIAGNOSIS — T380X5A Adverse effect of glucocorticoids and synthetic analogues, initial encounter: Secondary | ICD-10-CM | POA: Diagnosis not present

## 2015-09-28 DIAGNOSIS — I5043 Acute on chronic combined systolic (congestive) and diastolic (congestive) heart failure: Secondary | ICD-10-CM | POA: Diagnosis not present

## 2015-09-28 DIAGNOSIS — J09X2 Influenza due to identified novel influenza A virus with other respiratory manifestations: Secondary | ICD-10-CM | POA: Diagnosis not present

## 2015-09-28 DIAGNOSIS — J101 Influenza due to other identified influenza virus with other respiratory manifestations: Secondary | ICD-10-CM | POA: Diagnosis not present

## 2015-09-28 LAB — BASIC METABOLIC PANEL
ANION GAP: 11 (ref 5–15)
BUN: 35 mg/dL — ABNORMAL HIGH (ref 6–20)
CALCIUM: 8.3 mg/dL — AB (ref 8.9–10.3)
CO2: 26 mmol/L (ref 22–32)
Chloride: 92 mmol/L — ABNORMAL LOW (ref 101–111)
Creatinine, Ser: 1.4 mg/dL — ABNORMAL HIGH (ref 0.44–1.00)
GFR, EST AFRICAN AMERICAN: 39 mL/min — AB (ref >60)
GFR, EST NON AFRICAN AMERICAN: 33 mL/min — AB (ref >60)
GLUCOSE: 125 mg/dL — AB (ref 65–99)
POTASSIUM: 5.1 mmol/L (ref 3.5–5.1)
SODIUM: 129 mmol/L — AB (ref 135–145)

## 2015-09-28 LAB — STREP PNEUMONIAE URINARY ANTIGEN: STREP PNEUMO URINARY ANTIGEN: NEGATIVE

## 2015-09-28 LAB — GLUCOSE, CAPILLARY
GLUCOSE-CAPILLARY: 120 mg/dL — AB (ref 65–99)
GLUCOSE-CAPILLARY: 126 mg/dL — AB (ref 65–99)
Glucose-Capillary: 168 mg/dL — ABNORMAL HIGH (ref 65–99)
Glucose-Capillary: 112 mg/dL — ABNORMAL HIGH (ref 65–99)

## 2015-09-28 LAB — HIV ANTIBODY (ROUTINE TESTING W REFLEX): HIV SCREEN 4TH GENERATION: NONREACTIVE

## 2015-09-28 LAB — HEMOGLOBIN A1C
HEMOGLOBIN A1C: 6 % — AB (ref 4.8–5.6)
MEAN PLASMA GLUCOSE: 126 mg/dL

## 2015-09-28 MED ORDER — LORAZEPAM 0.5 MG PO TABS
0.5000 mg | ORAL_TABLET | Freq: Once | ORAL | Status: AC
  Administered 2015-09-28: 0.5 mg via ORAL
  Filled 2015-09-28: qty 1

## 2015-09-28 NOTE — Progress Notes (Signed)
[facility] PROGRESS NOTE  Haley Harris [id number] DOB: [birth year] DOA: 09/27/2015 PCP: [name], MD  HPI/Brief narrative See admit h and p from 3/17 for details. Briefly, 82 year old female with a past medical history significant for CHF and COPD; who presents with three-day history of shortness of breath. Patient found with acute respiratory failure that appears to be multifactorial. She was last seen in the emergency department 3 days prior and discharged home with was thought to be a viral illness given prednisone. Patient reporting fevers up to 100.67F. When EMS arrived patient was found to be hypoxic for which they placed her on CPAP. Once in the emergency department patient was changed over to BiPAP  Assessment/Plan:  Acute respiratory failure with hypoxia secondary to COPD with acute bronchitis from influenza and CAP without sepsis on presentation -Admited to telemetry/Obs -Continued Levaquin for suspected infectious component-of note no focal infiltrate on chest x-ray -Pt is flu pos. Started tamiflu -Patient was empirically given lasix 60 mg IV 1 but doubt acute heart failure (see below) -Supportive care with oxygen, DuoNeb as well as scheduled budesonide -Cont Solu-Medrol 40 mg IV every 12 hours   Hyponatremia -Has a chronic component and sending of ongoing diuretic usage -Acute component likely combination of volume overload as well as pseudohyponatremia from mild to moderate hyperglycemia   CAD s/p stents LAD/RCA -Currently asymptomatic -Continue preadmission aspirin, statin and beta blocker   AKI on CKD, stage III/acute hyperkalemia -ACE line renal function in July was BUN 17 creatinine 1.3 but since initiation of diuretics BUN has averaged between 32 and 39 with a creatinine between 1.26 and 1.57 -Because of relative hyperkalemia holding Aldactone  -Holding ARB inhibitor initially as well because of renal insufficiency   Dyslipidemia -Continue  Pravachol   Hyperglycemia -Was not on medications prior to admission and does not carry a formal diagnosis of diabetes -likely related to preadmission prednisone therefore steroid induced -Follow CBGs and provide SSI -HgbA1c   Neuropathy due to peripheral vascular disease  -Continue preadmission medications -No reported claudication symptomatology   Right adrenal mass  -Per primary care physician's visit on 04/12/15 it was felt that the abnormality seen on MRI was a benign adenoma and according to the documentation patient "doesn't want to look into this anymore"  Chronic Systolic CHF - Dry wt of 123456 - Current wt 78.0kg - Will continue to hold diuretics - Do not suspect acute failure - Will repeat CXR in AM  Code Status: Full Family Communication: Pt in room Disposition Plan: Unclear at this time   Consultants:    Procedures:    Antibiotics: Anti-infectives    Start     Dose/Rate Route Frequency Ordered Stop   09/29/15 0700  levofloxacin (LEVAQUIN) IVPB 750 mg     750 mg 100 mL/hr over 90 Minutes Intravenous Every 48 hours 09/27/15 1045     09/27/15 1045  levofloxacin (LEVAQUIN) IVPB 750 mg  Status:  Discontinued     750 mg 100 mL/hr over 90 Minutes Intravenous Every 24 hours 09/27/15 1033 09/27/15 1045   09/27/15 0700  levofloxacin (LEVAQUIN) IVPB 500 mg     500 mg 100 mL/hr over 60 Minutes Intravenous  Once 09/27/15 0640 09/27/15 0803      HPI/Subjective: Still complaining of cough and subjective sob  Objective: Filed Vitals:   09/28/15 0623 09/28/15 1005 09/28/15 1020 09/28/15 1231  BP: 151/89 120/68  146/58  Pulse: 73 78  60  Temp: 98.4 F (36.9 C) 98.7 F (37.1 C)  98.5 F (36.9 C)  TempSrc: Oral Oral  Oral  Resp: 20   20  Height:      Weight: 78.064 kg (172 lb 1.6 oz)     SpO2: 95% 94% 98% 98%    Intake/Output Summary (Last 24 hours) at 09/28/15 1705 Last data filed at 09/28/15 1624  Gross per 24 hour  Intake    720 ml  Output   1200  ml  Net   -480 ml   Filed Weights   09/27/15 0422 09/27/15 1556 09/28/15 0623  Weight: 78.019 kg (172 lb) 77.157 kg (170 lb 1.6 oz) 78.064 kg (172 lb 1.6 oz)    Exam:   General:  Awake, in nad  Cardiovascular: regular, s1, s2  Respiratory: mildly increased resp effort, end-expiratory wheezing throughout  Abdomen: soft, nondistended  Musculoskeletal: perfused, no clubbing   Data Reviewed: Basic Metabolic Panel:  Recent Labs Lab 09/24/15 1822 09/27/15 0435 09/28/15 0519  NA 132* 127* 129*  K 4.6 5.2* 5.1  CL 93* 91* 92*  CO2 26 22 26   GLUCOSE 172* 223* 125*  BUN 26* 34* 35*  CREATININE 1.26* 1.34* 1.40*  CALCIUM 8.8* 8.5* 8.3*   Liver Function Tests:  Recent Labs Lab 09/27/15 0435  AST 47*  ALT 17  ALKPHOS 48  BILITOT 1.1  PROT 6.3*  ALBUMIN 3.5   No results for input(s): LIPASE, AMYLASE in the last 168 hours. No results for input(s): AMMONIA in the last 168 hours. CBC:  Recent Labs Lab 09/24/15 1822 09/27/15 0435  WBC 5.3 8.5  NEUTROABS 4.1 7.2  HGB 11.4* 11.3*  HCT 35.0* 34.8*  MCV 98.6 96.7  PLT 181 215   Cardiac Enzymes: No results for input(s): CKTOTAL, CKMB, CKMBINDEX, TROPONINI in the last 168 hours. BNP (last 3 results)  Recent Labs  01/22/15 0405 01/23/15 0535 09/27/15 0435  BNP 1857.1* 990.7* 444.1*    ProBNP (last 3 results) No results for input(s): PROBNP in the last 8760 hours.  CBG:  Recent Labs Lab 09/27/15 1641 09/27/15 2127 09/28/15 0633 09/28/15 1223 09/28/15 1645  GLUCAP 171* 200* 120* 168* 112*    Recent Results (from the past 240 hour(s))  Blood culture (routine x 2)     Status: None (Preliminary result)   Collection Time: 09/27/15  4:20 AM  Result Value Ref Range Status   Specimen Description BLOOD LEFT ARM  Final   Special Requests BOTTLES DRAWN AEROBIC AND ANAEROBIC 10ML  Final   Culture NO GROWTH 1 DAY  Final   Report Status PENDING  Incomplete  Blood culture (routine x 2)     Status: None  (Preliminary result)   Collection Time: 09/27/15  4:48 AM  Result Value Ref Range Status   Specimen Description BLOOD LEFT WRIST  Final   Special Requests BOTTLES DRAWN AEROBIC ONLY 6ML  Final   Culture NO GROWTH 1 DAY  Final   Report Status PENDING  Incomplete     Studies: Dg Chest 2 View  09/28/2015  CLINICAL DATA:  Acute respiratory failure with hypoxia EXAM: CHEST  2 VIEW COMPARISON:  Chest radiograph from one day prior. FINDINGS: Stable cardiomediastinal silhouette with mild cardiomegaly. No pneumothorax. No pleural effusion. Mild patchy and curvilinear opacities at the right lung apex and right lung base, slightly improved. IMPRESSION: Slightly improved curvilinear and patchy opacities at the right lung apex and right lung base, favor bronchitis/bronchopneumonia. Cannot exclude a component of mild pulmonary edema given the cardiomegaly. Electronically Signed   By: Rinaldo Ratel  Poff M.D.   On: 09/28/2015 10:50   Dg Chest Portable 1 View  09/27/2015  CLINICAL DATA:  Shortness of breath EXAM: PORTABLE CHEST 1 VIEW COMPARISON:  09/24/2015 FINDINGS: There is symmetric and diffuse interstitial coarsening above baseline. Chronic hyperinflation and cardiomegaly. Negative aortic and hilar contours. No effusion or air leak. IMPRESSION: Progressive interstitial coarsening which could be bronchitic or congestive. Electronically Signed   By: Monte Fantasia M.D.   On: 09/27/2015 04:41    Scheduled Meds: . aspirin  81 mg Oral QPC lunch  . budesonide (PULMICORT) nebulizer solution  0.25 mg Nebulization BID  . carvedilol  6.25 mg Oral BID WC  . enoxaparin (LOVENOX) injection  40 mg Subcutaneous Q24H  . escitalopram  10 mg Oral Daily  . famotidine  20 mg Oral Daily  . gabapentin  100 mg Oral BID  . insulin aspart  0-15 Units Subcutaneous TID WC  . insulin aspart  0-5 Units Subcutaneous QHS  . ipratropium-albuterol  3 mL Nebulization TID  . [START ON 09/29/2015] levofloxacin (LEVAQUIN) IV  750 mg  Intravenous Q48H  . loratadine  10 mg Oral Daily  . methylPREDNISolone (SOLU-MEDROL) injection  40 mg Intravenous Q12H  . montelukast  10 mg Oral QHS  . multivitamin with minerals  1 tablet Oral QPC lunch  . pravastatin  80 mg Oral q1800  . sodium chloride flush  3 mL Intravenous Q12H   Continuous Infusions:   Principal Problem:   Acute respiratory failure with hypoxia (HCC) Active Problems:   Dyslipidemia   CAD (coronary artery disease), native coronary artery   Acute on chronic combined systolic and diastolic CHF, NYHA class 1 (HCC)   Neuropathy due to peripheral vascular disease (HCC)   CKD (chronic kidney disease), stage III   Right adrenal mass (HCC)   Hyponatremia   COPD with acute bronchitis ([place])   AKI (acute kidney injury) ([place])   Acute hyperkalemia    CHIU, [facility] Pager [phone]. If 7PM-7AM, please contact night-coverage at [email or web address], password [facility] 09/28/2015, 5:05 PM  LOS: 1 day

## 2015-09-29 ENCOUNTER — Inpatient Hospital Stay (HOSPITAL_COMMUNITY): Payer: Medicare Other

## 2015-09-29 DIAGNOSIS — N179 Acute kidney failure, unspecified: Secondary | ICD-10-CM

## 2015-09-29 LAB — OSMOLALITY: OSMOLALITY: 272 mosm/kg — AB (ref 275–295)

## 2015-09-29 LAB — BASIC METABOLIC PANEL
ANION GAP: 7 (ref 5–15)
BUN: 38 mg/dL — ABNORMAL HIGH (ref 6–20)
CHLORIDE: 91 mmol/L — AB (ref 101–111)
CO2: 27 mmol/L (ref 22–32)
Calcium: 7.9 mg/dL — ABNORMAL LOW (ref 8.9–10.3)
Creatinine, Ser: 1.18 mg/dL — ABNORMAL HIGH (ref 0.44–1.00)
GFR calc non Af Amer: 41 mL/min — ABNORMAL LOW (ref >60)
GFR, EST AFRICAN AMERICAN: 48 mL/min — AB (ref >60)
Glucose, Bld: 132 mg/dL — ABNORMAL HIGH (ref 65–99)
POTASSIUM: 5.1 mmol/L (ref 3.5–5.1)
SODIUM: 125 mmol/L — AB (ref 135–145)

## 2015-09-29 LAB — GLUCOSE, CAPILLARY
GLUCOSE-CAPILLARY: 120 mg/dL — AB (ref 65–99)
Glucose-Capillary: 126 mg/dL — ABNORMAL HIGH (ref 65–99)
Glucose-Capillary: 120 mg/dL — ABNORMAL HIGH (ref 65–99)
Glucose-Capillary: 119 mg/dL — ABNORMAL HIGH (ref 65–99)

## 2015-09-29 LAB — OSMOLALITY, URINE: Osmolality, Ur: 518 mOsm/kg (ref 300–900)

## 2015-09-29 LAB — SODIUM, URINE, RANDOM

## 2015-09-29 MED ORDER — LORAZEPAM 0.5 MG PO TABS
0.5000 mg | ORAL_TABLET | Freq: Once | ORAL | Status: AC
  Administered 2015-09-29: 0.5 mg via ORAL
  Filled 2015-09-29: qty 1

## 2015-09-29 MED ORDER — METHYLPREDNISOLONE SODIUM SUCC 125 MG IJ SOLR
60.0000 mg | Freq: Two times a day (BID) | INTRAMUSCULAR | Status: DC
  Administered 2015-09-29 – 2015-10-04 (×10): 60 mg via INTRAVENOUS
  Filled 2015-09-29 (×10): qty 2

## 2015-09-29 MED ORDER — BENZONATATE 100 MG PO CAPS
100.0000 mg | ORAL_CAPSULE | Freq: Three times a day (TID) | ORAL | Status: DC | PRN
  Administered 2015-09-29 – 2015-10-06 (×10): 100 mg via ORAL
  Filled 2015-09-29 (×10): qty 1

## 2015-09-29 MED ORDER — DOCUSATE SODIUM 100 MG PO CAPS
200.0000 mg | ORAL_CAPSULE | Freq: Two times a day (BID) | ORAL | Status: DC | PRN
  Administered 2015-09-29 – 2015-10-02 (×4): 200 mg via ORAL
  Filled 2015-09-29 (×4): qty 2

## 2015-09-29 NOTE — Progress Notes (Signed)
[facility] PROGRESS NOTE  Haley Harris [id number] DOB: 1928-11-02 DOA: 09/27/2015 PCP: [name], MD  HPI/Brief narrative See admit h and p from 3/17 for details. Briefly, 82 year old female with a past medical history significant for CHF and COPD; who presents with three-day history of shortness of breath. Patient found with acute respiratory failure that appears to be multifactorial. She was last seen in the emergency department 3 days prior and discharged home with was thought to be a viral illness given prednisone. Patient reporting fevers up to 100.105F. When EMS arrived patient was found to be hypoxic for which they placed her on CPAP. Once in the emergency department patient was changed over to BiPAP  Assessment/Plan:  Acute respiratory failure with hypoxia secondary to COPD with acute bronchitis from influenza and CAP without sepsis on presentation -Admited to telemetry/Obs -Continued Levaquin for suspected infectious component-of note no focal infiltrate on chest x-ray -Pt is flu pos. Started tamiflu -Patient was empirically given lasix 60 mg IV 1 but doubt acute heart failure (see below) -Supportive care with oxygen, DuoNeb as well as scheduled budesonide -Cont Solu-Medrol 40 mg IV every 12 hours   Hyponatremia -Has a chronic component and sending of ongoing diuretic usage - urine sodium low at <10 with urine osm of 518 - Suspect dehydration in setting of flu   CAD s/p stents LAD/RCA -Currently asymptomatic -Continue preadmission aspirin, statin and beta blocker   AKI on CKD, stage III/acute hyperkalemia -ACE line renal function in July was BUN 17 creatinine 1.3 but since initiation of diuretics BUN has averaged between 32 and 39 with a creatinine between 1.26 and 1.57 -Because of relative hyperkalemia holding Aldactone  -Holding ARB inhibitor initially as well because of renal insufficiency -Renal function improving   Dyslipidemia -Continue  Pravachol   Hyperglycemia -Was not on medications prior to admission and does not carry a formal diagnosis of diabetes -likely related to preadmission prednisone therefore steroid induced -Follow CBGs and provide SSI -HgbA1c   Neuropathy due to peripheral vascular disease  -Continue preadmission medications -No reported claudication symptomatology   Right adrenal mass  -Per primary care physician's visit on 04/12/15 it was felt that the abnormality seen on MRI was a benign adenoma and according to the documentation patient "doesn't want to look into this anymore"  Chronic Systolic CHF - Dry wt of 123456 - Current wt 78.9kg - Will continue to hold diuretics - Do not suspect acute failure - Repeat CXR unchanged - See above. Sodium workup suggesting volume depletion  Code Status: Full Family Communication: Pt in room Disposition Plan: Unclear at this time   Consultants:    Procedures:    Antibiotics: Anti-infectives    Start     Dose/Rate Route Frequency Ordered Stop   09/29/15 0700  levofloxacin (LEVAQUIN) IVPB 750 mg     750 mg 100 mL/hr over 90 Minutes Intravenous Every 48 hours 09/27/15 1045     09/27/15 1045  levofloxacin (LEVAQUIN) IVPB 750 mg  Status:  Discontinued     750 mg 100 mL/hr over 90 Minutes Intravenous Every 24 hours 09/27/15 1033 09/27/15 1045   09/27/15 0700  levofloxacin (LEVAQUIN) IVPB 500 mg     500 mg 100 mL/hr over 60 Minutes Intravenous  Once 09/27/15 0640 09/27/15 0803      HPI/Subjective: States "I feel horrible today"  Objective: Filed Vitals:   09/29/15 0637 09/29/15 0843 09/29/15 1244 09/29/15 1347  BP: 170/78  150/70   Pulse: 64  67  Temp: 98.5 F (36.9 C)  98.7 F (37.1 C)   TempSrc: Oral  Oral   Resp: 20  20   Height:      Weight: 78.971 kg (174 lb 1.6 oz)     SpO2: 98% 96% 98% 96%    Intake/Output Summary (Last 24 hours) at 09/29/15 1451 Last data filed at 09/29/15 1300  Gross per 24 hour  Intake   1470 ml   Output    750 ml  Net    720 ml   Filed Weights   09/27/15 1556 09/28/15 0623 09/29/15 0637  Weight: 77.157 kg (170 lb 1.6 oz) 78.064 kg (172 lb 1.6 oz) 78.971 kg (174 lb 1.6 oz)    Exam:   General:  Awake, in nad, laying in bed  Cardiovascular: regular, s1, s2  Respiratory: normal resp effort, end-expiratory wheezing throughout  Abdomen: soft, nondistended  Musculoskeletal: perfused, no clubbing,no cyanosis  Data Reviewed: Basic Metabolic Panel:  Recent Labs Lab 09/24/15 1822 09/27/15 0435 09/28/15 0519 09/29/15 0403  NA 132* 127* 129* 125*  K 4.6 5.2* 5.1 5.1  CL 93* 91* 92* 91*  CO2 26 22 26 27   GLUCOSE 172* 223* 125* 132*  BUN 26* 34* 35* 38*  CREATININE 1.26* 1.34* 1.40* 1.18*  CALCIUM 8.8* 8.5* 8.3* 7.9*   Liver Function Tests:  Recent Labs Lab 09/27/15 0435  AST 47*  ALT 17  ALKPHOS 48  BILITOT 1.1  PROT 6.3*  ALBUMIN 3.5   No results for input(s): LIPASE, AMYLASE in the last 168 hours. No results for input(s): AMMONIA in the last 168 hours. CBC:  Recent Labs Lab 09/24/15 1822 09/27/15 0435  WBC 5.3 8.5  NEUTROABS 4.1 7.2  HGB 11.4* 11.3*  HCT 35.0* 34.8*  MCV 98.6 96.7  PLT 181 215   Cardiac Enzymes: No results for input(s): CKTOTAL, CKMB, CKMBINDEX, TROPONINI in the last 168 hours. BNP (last 3 results)  Recent Labs  01/22/15 0405 01/23/15 0535 09/27/15 0435  BNP 1857.1* 990.7* 444.1*    ProBNP (last 3 results) No results for input(s): PROBNP in the last 8760 hours.  CBG:  Recent Labs Lab 09/28/15 1223 09/28/15 1645 09/28/15 2111 09/29/15 0635 09/29/15 1148  GLUCAP 168* 112* 126* 120* 126*    Recent Results (from the past 240 hour(s))  Blood culture (routine x 2)     Status: None (Preliminary result)   Collection Time: 09/27/15  4:20 AM  Result Value Ref Range Status   Specimen Description BLOOD LEFT ARM  Final   Special Requests BOTTLES DRAWN AEROBIC AND ANAEROBIC 10ML  Final   Culture NO GROWTH 2 DAYS   Final   Report Status PENDING  Incomplete  Blood culture (routine x 2)     Status: None (Preliminary result)   Collection Time: 09/27/15  4:48 AM  Result Value Ref Range Status   Specimen Description BLOOD LEFT WRIST  Final   Special Requests BOTTLES DRAWN AEROBIC ONLY 6ML  Final   Culture NO GROWTH 2 DAYS  Final   Report Status PENDING  Incomplete     Studies: Dg Chest 2 View  09/28/2015  CLINICAL DATA:  Acute respiratory failure with hypoxia EXAM: CHEST  2 VIEW COMPARISON:  Chest radiograph from one day prior. FINDINGS: Stable cardiomediastinal silhouette with mild cardiomegaly. No pneumothorax. No pleural effusion. Mild patchy and curvilinear opacities at the right lung apex and right lung base, slightly improved. IMPRESSION: Slightly improved curvilinear and patchy opacities at the right lung apex and right  lung base, favor bronchitis/bronchopneumonia. Cannot exclude a component of mild pulmonary edema given the cardiomegaly. Electronically Signed   By: Ilona Sorrel M.D.   On: 09/28/2015 10:50   Dg Chest Port 1 View  09/29/2015  CLINICAL DATA:  Productive cough for the past 6 days.  Slight fever. EXAM: PORTABLE CHEST 1 VIEW COMPARISON:  Yesterday. FINDINGS: Stable enlarged cardiac silhouette and prominence of the interstitial markings. Small amount of linear density in the right lower lung zone with little change. Otherwise, no airspace opacity. Diffuse osteopenia. Old, healed right humeral neck fracture. IMPRESSION: Stable mild cardiomegaly, mild interstitial lung disease mild right basilar atelectasis or scarring Electronically Signed   By: Claudie Revering M.D.   On: 09/29/2015 09:56    Scheduled Meds: . aspirin  81 mg Oral QPC lunch  . budesonide (PULMICORT) nebulizer solution  0.25 mg Nebulization BID  . carvedilol  6.25 mg Oral BID WC  . enoxaparin (LOVENOX) injection  40 mg Subcutaneous Q24H  . escitalopram  10 mg Oral Daily  . famotidine  20 mg Oral Daily  . gabapentin  100 mg Oral  BID  . insulin aspart  0-15 Units Subcutaneous TID WC  . insulin aspart  0-5 Units Subcutaneous QHS  . ipratropium-albuterol  3 mL Nebulization TID  . levofloxacin (LEVAQUIN) IV  750 mg Intravenous Q48H  . loratadine  10 mg Oral Daily  . methylPREDNISolone (SOLU-MEDROL) injection  60 mg Intravenous Q12H  . montelukast  10 mg Oral QHS  . multivitamin with minerals  1 tablet Oral QPC lunch  . pravastatin  80 mg Oral q1800  . sodium chloride flush  3 mL Intravenous Q12H   Continuous Infusions:   Principal Problem:   Acute respiratory failure with hypoxia (HCC) Active Problems:   Dyslipidemia   CAD (coronary artery disease), native coronary artery   Acute on chronic combined systolic and diastolic CHF, NYHA class 1 (HCC)   Neuropathy due to peripheral vascular disease (HCC)   CKD (chronic kidney disease), stage III   Right adrenal mass (HCC)   Hyponatremia   COPD with acute bronchitis ([place])   AKI (acute kidney injury) ([place])   Acute hyperkalemia    Dante Roudebush, [place] [facility] Pager [phone]. If 7PM-7AM, please contact night-coverage at [email or web address], password [facility] 09/29/2015, 2:51 PM  LOS: 2 days

## 2015-09-30 LAB — BASIC METABOLIC PANEL
ANION GAP: 6 (ref 5–15)
BUN: 31 mg/dL — ABNORMAL HIGH (ref 6–20)
CALCIUM: 7.7 mg/dL — AB (ref 8.9–10.3)
CO2: 26 mmol/L (ref 22–32)
Chloride: 92 mmol/L — ABNORMAL LOW (ref 101–111)
Creatinine, Ser: 0.99 mg/dL (ref 0.44–1.00)
GFR, EST AFRICAN AMERICAN: 59 mL/min — AB (ref >60)
GFR, EST NON AFRICAN AMERICAN: 51 mL/min — AB (ref >60)
Glucose, Bld: 127 mg/dL — ABNORMAL HIGH (ref 65–99)
POTASSIUM: 5.1 mmol/L (ref 3.5–5.1)
SODIUM: 124 mmol/L — AB (ref 135–145)

## 2015-09-30 LAB — GLUCOSE, CAPILLARY
GLUCOSE-CAPILLARY: 104 mg/dL — AB (ref 65–99)
GLUCOSE-CAPILLARY: 120 mg/dL — AB (ref 65–99)
GLUCOSE-CAPILLARY: 167 mg/dL — AB (ref 65–99)
Glucose-Capillary: 201 mg/dL — ABNORMAL HIGH (ref 65–99)

## 2015-09-30 MED ORDER — LORAZEPAM 0.5 MG PO TABS
0.5000 mg | ORAL_TABLET | Freq: Every evening | ORAL | Status: DC | PRN
  Administered 2015-09-30 – 2015-10-08 (×9): 0.5 mg via ORAL
  Filled 2015-09-30 (×9): qty 1

## 2015-09-30 MED ORDER — HYDRALAZINE HCL 20 MG/ML IJ SOLN
10.0000 mg | INTRAMUSCULAR | Status: DC | PRN
  Filled 2015-09-30: qty 1

## 2015-09-30 MED ORDER — LEVOFLOXACIN 750 MG PO TABS
750.0000 mg | ORAL_TABLET | ORAL | Status: DC
  Administered 2015-10-01 – 2015-10-03 (×2): 750 mg via ORAL
  Filled 2015-09-30 (×4): qty 1

## 2015-09-30 NOTE — Progress Notes (Signed)
[facility] PROGRESS NOTE  Haley Harris [id number] DOB: 07-10-1929 DOA: 09/27/2015 PCP: [name], MD  HPI/Brief narrative See admit h and p from 3/17 for details. Briefly, 82 year old female with a past medical history significant for CHF and COPD; who presents with three-day history of shortness of breath. Patient found with acute respiratory failure that appears to be multifactorial. She was last seen in the emergency department 3 days prior and discharged home with was thought to be a viral illness given prednisone. Patient reporting fevers up to 100.41F. When EMS arrived patient was found to be hypoxic for which they placed her on CPAP. Once in the emergency department patient was changed over to BiPAP  Assessment/Plan:  Acute respiratory failure with hypoxia secondary to COPD with acute bronchitis from influenza and CAP without sepsis on presentation -Admited to telemetry/Obs -Continued Levaquin for suspected infectious component-of note no focal infiltrate on chest x-ray -Pt is flu pos. Started tamiflu -Patient was empirically given lasix 60 mg IV 1 in ED -Supportive care with oxygen, DuoNeb as well as scheduled budesonide -Cont Solu-Medrol 40 mg IV every 12 hours   Hyponatremia -Has a chronic component in setting of ongoing diuretic usage - urine sodium low at <10 with urine osm of 518, possible dehydration in setting of flu   CAD s/p stents LAD/RCA -Currently asymptomatic -Continue preadmission aspirin, statin and beta blocker   AKI on CKD, stage III/acute hyperkalemia -ACE line renal function in July was BUN 17 creatinine 1.3 but since initiation of diuretics BUN has averaged between 32 and 39 with a creatinine between 1.26 and 1.57 -Because of relative hyperkalemia holding Aldactone  -Holding ARB inhibitor initially as well because of renal insufficiency -Renal function improving to near normal   Dyslipidemia -Continue Pravachol   Hyperglycemia -Was  not on medications prior to admission and does not carry a formal diagnosis of diabetes -likely related to preadmission prednisone therefore steroid induced -Follow CBGs and provide SSI -HgbA1c   Neuropathy due to peripheral vascular disease  -Continue preadmission medications -No reported claudication symptomatology   Right adrenal mass  -Per primary care physician's visit on 04/12/15 it was felt that the abnormality seen on MRI was a benign adenoma and according to the documentation patient "doesn't want to look into this anymore"  Chronic Systolic CHF - Dry wt of 123456 - Current wt 79kg - Have been holding diuretics secondary to flu - Repeat CXR unchanged  Code Status: Full Family Communication: Pt in room Disposition Plan: Unclear at this time   Consultants:    Procedures:    Antibiotics: Anti-infectives    Start     Dose/Rate Route Frequency Ordered Stop   10/01/15 0700  levofloxacin (LEVAQUIN) tablet 750 mg     750 mg Oral Every 48 hours 09/30/15 1312     09/29/15 0700  levofloxacin (LEVAQUIN) IVPB 750 mg  Status:  Discontinued     750 mg 100 mL/hr over 90 Minutes Intravenous Every 48 hours 09/27/15 1045 09/30/15 1312   09/27/15 1045  levofloxacin (LEVAQUIN) IVPB 750 mg  Status:  Discontinued     750 mg 100 mL/hr over 90 Minutes Intravenous Every 24 hours 09/27/15 1033 09/27/15 1045   09/27/15 0700  levofloxacin (LEVAQUIN) IVPB 500 mg     500 mg 100 mL/hr over 60 Minutes Intravenous  Once 09/27/15 0640 09/27/15 0803      HPI/Subjective: Feels somewhat better today  Objective: Filed Vitals:   09/30/15 [id number] 09/30/15 [id number] 09/30/15 1138 09/30/15 1419  BP: 142/64  154/89   Pulse: 64  94   Temp: 98.3 F (36.8 C)  97.9 F (36.6 C)   TempSrc: Oral  Oral   Resp: 18  18   Height:      Weight:      SpO2: 98% 97% 90% 95%    Intake/Output Summary (Last 24 hours) at 09/30/15 1634 Last data filed at 09/30/15 1450  Gross per 24 hour  Intake   1080 ml   Output   1475 ml  Net   -395 ml   Filed Weights   09/28/15 0623 09/29/15 0637 09/30/15 0624  Weight: 78.064 kg (172 lb 1.6 oz) 78.971 kg (174 lb 1.6 oz) 79.017 kg (174 lb 3.2 oz)    Exam:   General:  Awake, in nad, laying in bed  Cardiovascular: regular, s1, s2  Respiratory: normal resp effort, end-expiratory wheezing throughout  Abdomen: soft, nondistended, pos BS  Musculoskeletal: perfused, no cyanosis, no clubbing  Data Reviewed: Basic Metabolic Panel:  Recent Labs Lab 09/24/15 1822 09/27/15 0435 09/28/15 0519 09/29/15 0403 09/30/15 0530  NA 132* 127* 129* 125* 124*  K 4.6 5.2* 5.1 5.1 5.1  CL 93* 91* 92* 91* 92*  CO2 26 22 26 27 26   GLUCOSE 172* 223* 125* 132* 127*  BUN 26* 34* 35* 38* 31*  CREATININE 1.26* 1.34* 1.40* 1.18* 0.99  CALCIUM 8.8* 8.5* 8.3* 7.9* 7.7*   Liver Function Tests:  Recent Labs Lab 09/27/15 0435  AST 47*  ALT 17  ALKPHOS 48  BILITOT 1.1  PROT 6.3*  ALBUMIN 3.5   No results for input(s): LIPASE, AMYLASE in the last 168 hours. No results for input(s): AMMONIA in the last 168 hours. CBC:  Recent Labs Lab 09/24/15 1822 09/27/15 0435  WBC 5.3 8.5  NEUTROABS 4.1 7.2  HGB 11.4* 11.3*  HCT 35.0* 34.8*  MCV 98.6 96.7  PLT 181 215   Cardiac Enzymes: No results for input(s): CKTOTAL, CKMB, CKMBINDEX, TROPONINI in the last 168 hours. BNP (last 3 results)  Recent Labs  01/22/15 0405 01/23/15 0535 09/27/15 0435  BNP 1857.1* 990.7* 444.1*    ProBNP (last 3 results) No results for input(s): PROBNP in the last 8760 hours.  CBG:  Recent Labs Lab 09/29/15 1148 09/29/15 1643 09/29/15 2134 09/30/15 0603 09/30/15 1050  GLUCAP 126* 120* 119* 104* 201*    Recent Results (from the past 240 hour(s))  Blood culture (routine x 2)     Status: None (Preliminary result)   Collection Time: 09/27/15  4:20 AM  Result Value Ref Range Status   Specimen Description BLOOD LEFT ARM  Final   Special Requests BOTTLES DRAWN AEROBIC  AND ANAEROBIC 10ML  Final   Culture NO GROWTH 3 DAYS  Final   Report Status PENDING  Incomplete  Blood culture (routine x 2)     Status: None (Preliminary result)   Collection Time: 09/27/15  4:48 AM  Result Value Ref Range Status   Specimen Description BLOOD LEFT WRIST  Final   Special Requests BOTTLES DRAWN AEROBIC ONLY 6ML  Final   Culture NO GROWTH 3 DAYS  Final   Report Status PENDING  Incomplete     Studies: Dg Chest Port 1 View  09/29/2015  CLINICAL DATA:  Productive cough for the past 6 days.  Slight fever. EXAM: PORTABLE CHEST 1 VIEW COMPARISON:  Yesterday. FINDINGS: Stable enlarged cardiac silhouette and prominence of the interstitial markings. Small amount of linear density in the right lower lung zone with  little change. Otherwise, no airspace opacity. Diffuse osteopenia. Old, healed right humeral neck fracture. IMPRESSION: Stable mild cardiomegaly, mild interstitial lung disease mild right basilar atelectasis or scarring Electronically Signed   By: Claudie Revering M.D.   On: 09/29/2015 09:56    Scheduled Meds: . aspirin  81 mg Oral QPC lunch  . budesonide (PULMICORT) nebulizer solution  0.25 mg Nebulization BID  . carvedilol  6.25 mg Oral BID WC  . enoxaparin (LOVENOX) injection  40 mg Subcutaneous Q24H  . escitalopram  10 mg Oral Daily  . famotidine  20 mg Oral Daily  . gabapentin  100 mg Oral BID  . insulin aspart  0-15 Units Subcutaneous TID WC  . insulin aspart  0-5 Units Subcutaneous QHS  . ipratropium-albuterol  3 mL Nebulization TID  . [START ON 10/01/2015] levofloxacin  750 mg Oral Q48H  . loratadine  10 mg Oral Daily  . methylPREDNISolone (SOLU-MEDROL) injection  60 mg Intravenous Q12H  . montelukast  10 mg Oral QHS  . multivitamin with minerals  1 tablet Oral QPC lunch  . pravastatin  80 mg Oral q1800  . sodium chloride flush  3 mL Intravenous Q12H   Continuous Infusions:   Principal Problem:   Acute respiratory failure with hypoxia (HCC) Active Problems:    Dyslipidemia   CAD (coronary artery disease), native coronary artery   Acute on chronic combined systolic and diastolic CHF, NYHA class 1 (HCC)   Neuropathy due to peripheral vascular disease (HCC)   CKD (chronic kidney disease), stage III   Right adrenal mass (HCC)   Hyponatremia   COPD with acute bronchitis ([place])   AKI (acute kidney injury) ([place])   Acute hyperkalemia   Onyinyechi Huante, [facility] Pager [phone]. If 7PM-7AM, please contact night-coverage at [email or web address], password [facility] 09/30/2015, 4:34 PM  LOS: 3 days

## 2015-09-30 NOTE — Progress Notes (Signed)
Patient request Ativan to help her sleep. Notified hospitalist. Orders given and will administer medication per PRN order.

## 2015-10-01 LAB — BASIC METABOLIC PANEL
ANION GAP: 9 (ref 5–15)
BUN: 38 mg/dL — AB (ref 6–20)
CALCIUM: 7.9 mg/dL — AB (ref 8.9–10.3)
CO2: 26 mmol/L (ref 22–32)
Chloride: 91 mmol/L — ABNORMAL LOW (ref 101–111)
Creatinine, Ser: 1.04 mg/dL — ABNORMAL HIGH (ref 0.44–1.00)
GFR calc Af Amer: 56 mL/min — ABNORMAL LOW (ref >60)
GFR, EST NON AFRICAN AMERICAN: 48 mL/min — AB (ref >60)
GLUCOSE: 111 mg/dL — AB (ref 65–99)
Potassium: 4.9 mmol/L (ref 3.5–5.1)
Sodium: 126 mmol/L — ABNORMAL LOW (ref 135–145)

## 2015-10-01 LAB — GLUCOSE, CAPILLARY
GLUCOSE-CAPILLARY: 112 mg/dL — AB (ref 65–99)
GLUCOSE-CAPILLARY: 146 mg/dL — AB (ref 65–99)
GLUCOSE-CAPILLARY: 142 mg/dL — AB (ref 65–99)
Glucose-Capillary: 109 mg/dL — ABNORMAL HIGH (ref 65–99)

## 2015-10-01 LAB — LEGIONELLA PNEUMOPHILA SEROGP 1 UR AG: L. pneumophila Serogp 1 Ur Ag: NEGATIVE

## 2015-10-01 MED ORDER — BUDESONIDE 0.25 MG/2ML IN SUSP
0.2500 mg | Freq: Two times a day (BID) | RESPIRATORY_TRACT | Status: DC
  Administered 2015-10-01 – 2015-10-09 (×17): 0.25 mg via RESPIRATORY_TRACT
  Filled 2015-10-01 (×17): qty 2

## 2015-10-01 MED ORDER — FUROSEMIDE 40 MG PO TABS
40.0000 mg | ORAL_TABLET | Freq: Every day | ORAL | Status: DC
Start: 2015-10-01 — End: 2015-10-02
  Administered 2015-10-01: 40 mg via ORAL
  Filled 2015-10-01: qty 1

## 2015-10-01 NOTE — Progress Notes (Signed)
[facility] PROGRESS NOTE  Haley Harris [id number] DOB: 06-Aug-1928 DOA: 09/27/2015 PCP: [name], MD  HPI/Brief narrative See admit h and p from 3/17 for details. Briefly, 82 year old female with a past medical history significant for CHF and COPD; who presents with three-day history of shortness of breath. Patient found with acute respiratory failure that appears to be multifactorial. She was last seen in the emergency department 3 days prior and discharged home with was thought to be a viral illness given prednisone. Patient reporting fevers up to 100.61F. When EMS arrived patient was found to be hypoxic for which they placed her on CPAP. Once in the emergency department patient was changed over to BiPAP  Assessment/Plan:  Acute respiratory failure with hypoxia secondary to COPD with acute bronchitis from influenza and CAP without sepsis on presentation -Admited to telemetry/Obs -Continued Levaquin for suspected infectious component-of note no focal infiltrate on chest x-ray -Pt is flu pos. Started tamiflu -Patient was empirically given lasix 60 mg IV 1 in ED -Supportive care with oxygen, DuoNeb as well as scheduled budesonide -Cont Solu-Medrol 40 mg IV every 12 hours -Patient continues with marked wheezing on exam. Continuing IV steroids and nebs   Hyponatremia -Has a chronic component in setting of ongoing diuretic usage - urine sodium low at <10 with urine osm of 518, possible dehydration in setting of flu - Improving   CAD s/p stents LAD/RCA -Currently asymptomatic -Continue preadmission aspirin, statin and beta blocker   AKI on CKD, stage III/acute hyperkalemia -ACE line renal function in July was BUN 17 creatinine 1.3 but since initiation of diuretics BUN has averaged between 32 and 39 with a creatinine between 1.26 and 1.57 -Because of relative hyperkalemia holding Aldactone  -Holding ARB inhibitor initially as well because of renal insufficiency -Renal  function has improved to normal   Dyslipidemia -Continue Pravachol   Hyperglycemia -Was not on medications prior to admission and does not carry a formal diagnosis of diabetes -likely related to preadmission prednisone therefore steroid induced -Follow CBGs and provide SSI -HgbA1c 6.0   Neuropathy due to peripheral vascular disease  -Continue preadmission medications -No reported claudication symptomatology   Right adrenal mass  -Per primary care physician's visit on 04/12/15 it was felt that the abnormality seen on MRI was a benign adenoma and according to the documentation patient "doesn't want to look into this anymore"  Chronic Systolic CHF - Dry wt of 123456 - Current wt 80.2kg - Have been holding diuretics secondary to flu - Will resume home PO lasix given rise in wt  Code Status: Full Family Communication: Pt in room Disposition Plan: Unclear at this time   Consultants:    Procedures:    Antibiotics: Anti-infectives    Start     Dose/Rate Route Frequency Ordered Stop   10/01/15 0700  levofloxacin (LEVAQUIN) tablet 750 mg     750 mg Oral Every 48 hours 09/30/15 1312     09/29/15 0700  levofloxacin (LEVAQUIN) IVPB 750 mg  Status:  Discontinued     750 mg 100 mL/hr over 90 Minutes Intravenous Every 48 hours 09/27/15 1045 09/30/15 1312   09/27/15 1045  levofloxacin (LEVAQUIN) IVPB 750 mg  Status:  Discontinued     750 mg 100 mL/hr over 90 Minutes Intravenous Every 24 hours 09/27/15 1033 09/27/15 1045   09/27/15 0700  levofloxacin (LEVAQUIN) IVPB 500 mg     500 mg 100 mL/hr over 60 Minutes Intravenous  Once 09/27/15 0640 09/27/15 0803  HPI/Subjective: Reports feeling worse today, but still better than when initially admitted. Getting neb tx at present  Objective: Filed Vitals:   10/01/15 0425 10/01/15 0800 10/01/15 1005 10/01/15 1221  BP:  141/79  130/73  Pulse: 75 86  77  Temp: 98.2 F (36.8 C)   98.8 F (37.1 C)  TempSrc: Oral   Oral  Resp:  18   18  Height:      Weight: 80.287 kg (177 lb)     SpO2: 96%  99% 96%    Intake/Output Summary (Last 24 hours) at 10/01/15 1358 Last data filed at 10/01/15 1116  Gross per 24 hour  Intake    960 ml  Output    900 ml  Net     60 ml   Filed Weights   09/29/15 0637 09/30/15 0624 10/01/15 0425  Weight: 78.971 kg (174 lb 1.6 oz) 79.017 kg (174 lb 3.2 oz) 80.287 kg (177 lb)    Exam:   General:  Awake, laying in bed, appears uncomfortable  Cardiovascular: regular, s1, s2  Respiratory: normal resp effort, end-expiratory wheezing throughout  Abdomen: soft, nondistended, pos BS  Musculoskeletal: perfused, no cyanosis, no clubbing  Data Reviewed: Basic Metabolic Panel:  Recent Labs Lab 09/27/15 0435 09/28/15 0519 09/29/15 0403 09/30/15 0530 10/01/15 0302  NA 127* 129* 125* 124* 126*  K 5.2* 5.1 5.1 5.1 4.9  CL 91* 92* 91* 92* 91*  CO2 22 26 27 26 26   GLUCOSE 223* 125* 132* 127* 111*  BUN 34* 35* 38* 31* 38*  CREATININE 1.34* 1.40* 1.18* 0.99 1.04*  CALCIUM 8.5* 8.3* 7.9* 7.7* 7.9*   Liver Function Tests:  Recent Labs Lab 09/27/15 0435  AST 47*  ALT 17  ALKPHOS 48  BILITOT 1.1  PROT 6.3*  ALBUMIN 3.5   No results for input(s): LIPASE, AMYLASE in the last 168 hours. No results for input(s): AMMONIA in the last 168 hours. CBC:  Recent Labs Lab 09/24/15 1822 09/27/15 0435  WBC 5.3 8.5  NEUTROABS 4.1 7.2  HGB 11.4* 11.3*  HCT 35.0* 34.8*  MCV 98.6 96.7  PLT 181 215   Cardiac Enzymes: No results for input(s): CKTOTAL, CKMB, CKMBINDEX, TROPONINI in the last 168 hours. BNP (last 3 results)  Recent Labs  01/22/15 0405 01/23/15 0535 09/27/15 0435  BNP 1857.1* 990.7* 444.1*    ProBNP (last 3 results) No results for input(s): PROBNP in the last 8760 hours.  CBG:  Recent Labs Lab 09/30/15 1050 09/30/15 1637 09/30/15 2113 10/01/15 0717 10/01/15 1220  GLUCAP 201* 120* 167* 112* 146*    Recent Results (from the past 240 hour(s))  Blood  culture (routine x 2)     Status: None (Preliminary result)   Collection Time: 09/27/15  4:20 AM  Result Value Ref Range Status   Specimen Description BLOOD LEFT ARM  Final   Special Requests BOTTLES DRAWN AEROBIC AND ANAEROBIC 10ML  Final   Culture NO GROWTH 3 DAYS  Final   Report Status PENDING  Incomplete  Blood culture (routine x 2)     Status: None (Preliminary result)   Collection Time: 09/27/15  4:48 AM  Result Value Ref Range Status   Specimen Description BLOOD LEFT WRIST  Final   Special Requests BOTTLES DRAWN AEROBIC ONLY 6ML  Final   Culture NO GROWTH 3 DAYS  Final   Report Status PENDING  Incomplete     Studies: No results found.  Scheduled Meds: . aspirin  81 mg Oral QPC lunch  .  budesonide  0.25 mg Nebulization BID  . carvedilol  6.25 mg Oral BID WC  . enoxaparin (LOVENOX) injection  40 mg Subcutaneous Q24H  . escitalopram  10 mg Oral Daily  . famotidine  20 mg Oral Daily  . furosemide  40 mg Oral Daily  . gabapentin  100 mg Oral BID  . insulin aspart  0-15 Units Subcutaneous TID WC  . insulin aspart  0-5 Units Subcutaneous QHS  . ipratropium-albuterol  3 mL Nebulization TID  . levofloxacin  750 mg Oral Q48H  . loratadine  10 mg Oral Daily  . methylPREDNISolone (SOLU-MEDROL) injection  60 mg Intravenous Q12H  . montelukast  10 mg Oral QHS  . multivitamin with minerals  1 tablet Oral QPC lunch  . pravastatin  80 mg Oral q1800  . sodium chloride flush  3 mL Intravenous Q12H   Continuous Infusions:   Principal Problem:   Acute respiratory failure with hypoxia (HCC) Active Problems:   Dyslipidemia   CAD (coronary artery disease), native coronary artery   Acute on chronic combined systolic and diastolic CHF, NYHA class 1 (HCC)   Neuropathy due to peripheral vascular disease (HCC)   CKD (chronic kidney disease), stage III   Right adrenal mass (HCC)   Hyponatremia   COPD with acute bronchitis ([place])   AKI (acute kidney injury) ([place])   Acute  hyperkalemia   Elliana Bal, [place] [facility] Pager [phone]. If 7PM-7AM, please contact night-coverage at [email or web address], password [facility] 10/01/2015, 1:58 PM  LOS: 4 days

## 2015-10-01 NOTE — Consult Note (Signed)
   [facility] CM Inpatient Consult   10/01/2015  Haley Harris Feb 19, 1929 [id number] Patient has been screened for [facility] services.  Noted that this patient has been assigned for [facility] COPD EMMI calls. No further needs identified at this time.  For questions, or referrals, please contact: Natividad Brood, RN BSN [facility] Liaison  [phone] business mobile phone Toll free office [phone]

## 2015-10-02 ENCOUNTER — Inpatient Hospital Stay (HOSPITAL_COMMUNITY): Payer: Medicare Other

## 2015-10-02 DIAGNOSIS — E871 Hypo-osmolality and hyponatremia: Secondary | ICD-10-CM

## 2015-10-02 DIAGNOSIS — I25111 Atherosclerotic heart disease of native coronary artery with angina pectoris with documented spasm: Secondary | ICD-10-CM

## 2015-10-02 DIAGNOSIS — I5043 Acute on chronic combined systolic (congestive) and diastolic (congestive) heart failure: Secondary | ICD-10-CM

## 2015-10-02 DIAGNOSIS — J9601 Acute respiratory failure with hypoxia: Secondary | ICD-10-CM

## 2015-10-02 LAB — GLUCOSE, CAPILLARY
GLUCOSE-CAPILLARY: 96 mg/dL (ref 65–99)
GLUCOSE-CAPILLARY: 159 mg/dL — AB (ref 65–99)
Glucose-Capillary: 128 mg/dL — ABNORMAL HIGH (ref 65–99)
Glucose-Capillary: 125 mg/dL — ABNORMAL HIGH (ref 65–99)

## 2015-10-02 LAB — CULTURE, BLOOD (ROUTINE X 2)
CULTURE: NO GROWTH
Culture: NO GROWTH

## 2015-10-02 LAB — CBC
HEMATOCRIT: 32.4 % — AB (ref 36.0–46.0)
Hemoglobin: 11.4 g/dL — ABNORMAL LOW (ref 12.0–15.0)
MCH: 33.3 pg (ref 26.0–34.0)
MCHC: 35.2 g/dL (ref 30.0–36.0)
MCV: 94.7 fL (ref 78.0–100.0)
PLATELETS: 175 10*3/uL (ref 150–400)
RBC: 3.42 MIL/uL — ABNORMAL LOW (ref 3.87–5.11)
RDW: 12.2 % (ref 11.5–15.5)
WBC: 7.8 10*3/uL (ref 4.0–10.5)

## 2015-10-02 LAB — BASIC METABOLIC PANEL
ANION GAP: 12 (ref 5–15)
BUN: 36 mg/dL — ABNORMAL HIGH (ref 6–20)
CALCIUM: 7.7 mg/dL — AB (ref 8.9–10.3)
CO2: 25 mmol/L (ref 22–32)
CREATININE: 1.12 mg/dL — AB (ref 0.44–1.00)
Chloride: 87 mmol/L — ABNORMAL LOW (ref 101–111)
GFR, EST AFRICAN AMERICAN: 51 mL/min — AB (ref >60)
GFR, EST NON AFRICAN AMERICAN: 44 mL/min — AB (ref >60)
Glucose, Bld: 174 mg/dL — ABNORMAL HIGH (ref 65–99)
Potassium: 4.5 mmol/L (ref 3.5–5.1)
Sodium: 124 mmol/L — ABNORMAL LOW (ref 135–145)

## 2015-10-02 MED ORDER — IPRATROPIUM-ALBUTEROL 0.5-2.5 (3) MG/3ML IN SOLN
3.0000 mL | Freq: Four times a day (QID) | RESPIRATORY_TRACT | Status: DC
  Administered 2015-10-02 – 2015-10-03 (×5): 3 mL via RESPIRATORY_TRACT
  Filled 2015-10-02 (×5): qty 3

## 2015-10-02 MED ORDER — FUROSEMIDE 10 MG/ML IJ SOLN
40.0000 mg | Freq: Two times a day (BID) | INTRAMUSCULAR | Status: AC
  Administered 2015-10-02 (×2): 40 mg via INTRAVENOUS
  Filled 2015-10-02 (×2): qty 4

## 2015-10-02 MED ORDER — OXYMETAZOLINE HCL 0.05 % NA SOLN
1.0000 | Freq: Two times a day (BID) | NASAL | Status: AC
  Administered 2015-10-02 – 2015-10-04 (×5): 1 via NASAL
  Filled 2015-10-02: qty 15

## 2015-10-02 MED ORDER — POTASSIUM CHLORIDE CRYS ER 20 MEQ PO TBCR
20.0000 meq | EXTENDED_RELEASE_TABLET | Freq: Two times a day (BID) | ORAL | Status: DC
  Administered 2015-10-02 – 2015-10-09 (×15): 20 meq via ORAL
  Filled 2015-10-02 (×15): qty 1

## 2015-10-02 MED ORDER — FLUTICASONE PROPIONATE 50 MCG/ACT NA SUSP
2.0000 | Freq: Every day | NASAL | Status: DC
Start: 2015-10-02 — End: 2015-10-09
  Administered 2015-10-02 – 2015-10-09 (×8): 2 via NASAL
  Filled 2015-10-02: qty 16

## 2015-10-02 MED ORDER — GUAIFENESIN ER 600 MG PO TB12
600.0000 mg | ORAL_TABLET | Freq: Two times a day (BID) | ORAL | Status: DC
  Administered 2015-10-02 (×2): 600 mg via ORAL
  Filled 2015-10-02 (×2): qty 1

## 2015-10-02 NOTE — Evaluation (Signed)
Physical Therapy Evaluation Patient Details Name: Haley Harris MRN: [id number] DOB: 1928/12/08 Today's Date: 10/02/2015   History of Present Illness  82 year old female with a past medical history significant for CHF and COPD; who presents with three-day history of shortness of breath; Acute respiratory failure with hypoxia secondary to COPD with acute bronchitis from influenza and CAP without sepsis  Clinical Impression  Patient demonstrates deficits in functional mobility as indicated below. Will need continued skilled PT to address deficits and maximize function. Will see as indicated and progress as tolerated. Recommend HHPT upon acute discharge. Patient in agreement.    Follow Up Recommendations Home health PT;Supervision for mobility/OOB    Equipment Recommendations  None recommended by PT    Recommendations for Other Services       Precautions / Restrictions Precautions Precautions: Fall Precaution Comments: watch O2 sats Restrictions Weight Bearing Restrictions: No      Mobility  Bed Mobility               General bed mobility comments: received up in chair  Transfers Overall transfer level: Needs assistance Equipment used: None Transfers: Sit to/from Stand;Stand Pivot Transfers Sit to Stand: Min guard Stand pivot transfers: Min guard       General transfer comment: Vcs for hand placement, increased time to perform. No physical assist required  Ambulation/Gait Ambulation/Gait assistance: Min guard Ambulation Distance (Feet): 40 Feet Assistive device: Rolling walker (2 wheeled) Gait Pattern/deviations: Step-through pattern;Decreased stride length;Trunk flexed;Drifts right/left Gait velocity: decreased Gait velocity interpretation: Below normal speed for age/gender General Gait Details: patient with decreased activity tolerance and slow gait speed. Reliance on Rw with flexed posture. Reports some DOE. Ambulated on room air with sats >92% HR uppers  70s  Stairs            Wheelchair Mobility    Modified Rankin (Stroke Patients Only)       Balance Overall balance assessment: Needs assistance Sitting-balance support: No upper extremity supported Sitting balance-Leahy Scale: Good     Standing balance support: During functional activity Standing balance-Leahy Scale: Fair Standing balance comment: patient able to perform dynamic standing for hygiene and pericare without assist                             Pertinent Vitals/Pain Pain Assessment: No/denies pain    Home Living Family/patient expects to be discharged to:: Private residence Living Arrangements: Children Available Help at Discharge: Family;Available 24 hours/day (Daughter, son-in-law (currently ill), 2 grandsons) Type of Home: House Home Access: Stairs to enter Entrance Stairs-Rails: Can reach both Entrance Stairs-Number of Steps: 3 Home Layout: Able to live on main level with bedroom/bathroom Home Equipment: Cane - single point;Transport chair;Walker - 4 wheels;Walker - 2 wheels;Shower seat;Toilet riser      Prior Function Level of Independence: Independent with assistive device(s) (Rollator for longer distances)               Hand Dominance   Dominant Hand: Right    Extremity/Trunk Assessment   Upper Extremity Assessment: Overall WFL for tasks assessed           Lower Extremity Assessment: Generalized weakness         Communication   Communication: HOH  Cognition Arousal/Alertness: Awake/alert Behavior During Therapy: WFL for tasks assessed/performed Overall Cognitive Status: Within Functional Limits for tasks assessed  General Comments      Exercises        Assessment/Plan    PT Assessment Patient needs continued PT services  PT Diagnosis Difficulty walking;Generalized weakness   PT Problem List Decreased strength;Decreased activity tolerance;Decreased balance;Decreased  mobility;Obesity;Cardiopulmonary status limiting activity  PT Treatment Interventions DME instruction;Gait training;Stair training;Functional mobility training;Therapeutic exercise;Therapeutic activities;Balance training;Patient/family education   PT Goals (Current goals can be found in the Care Plan section) Acute Rehab PT Goals Patient Stated Goal: to go home PT Goal Formulation: With patient Time For Goal Achievement: 10/16/15 Potential to Achieve Goals: Good    Frequency Min 3X/week   Barriers to discharge        Co-evaluation               End of Session Equipment Utilized During Treatment: Gait belt;Oxygen Activity Tolerance: Patient limited by fatigue;Patient tolerated treatment well Patient left: in chair;with call bell/phone within reach Nurse Communication: Mobility status         Time: 1542-1600 PT Time Calculation (min) (ACUTE ONLY): 18 min   Charges:   PT Evaluation $PT Eval Moderate Complexity: 1 Procedure     PT G CodesDuncan Dull 10-07-15, 4:40 PM Alben Deeds, [place] DPT  [phone]

## 2015-10-02 NOTE — Plan of Care (Signed)
Problem: Phase II Progression Outcomes Goal: ADLs completed with minimal assistance Outcome: Progressing Up with one assist

## 2015-10-02 NOTE — Progress Notes (Signed)
PATIENT DETAILS Name: Haley Harris Age: 82 y.o. Sex: female Date of Birth: September 14, 1928 Admit Date: 09/27/2015 Admitting Physician Geradine Girt, DO KH:[id number] Silvio Pate, MD  Subjective: Somewhat better-still coughing and wheezing. Claims to have nasal congestion and some postnasal drip.  Assessment/Plan: Principal Problem: Acute respiratory failure with hypoxia: Secondary to acute bronchitis from influenza. Slowly improving, continue IV steroids, IV Levaquin, nebulized bronchodilators.Will add- Flonase/Afrin and mucinex. Encourage incentive spirometry, flutter valve-chest PT to mobilize secretions.  Active Problems: Influenza A: Likely causing above, since already improving-and since more than 5 days from initial symptoms-we will continue to monitor off Tamiflu- continue to provide supportive care.  Acute on chronic systolic heart failure (EF by TTE July 2016-40%): Has developed edema, weight has steadily increased to 178 pounds (170 pounds on admission). Stop oral Lasix, start IV Lasix and follow.  Hyponatremia: Likely secondary to acute systolic heart failure. Fluid restrict, start IV Lasix and follow.  History of CAD: Currently stable without any chest pain or shortness of breath. Continue aspirin, Coreg and pravastatin.  Hypertension: Controlled with Coreg, resume losartan in the next few days.  CKD stage III: Creatinine close to usual baseline, follow.  Steroid-induced hyperglycemia: Continue SSI while on steroids. A1c 6.0.  History of right adrenal mass:Chronic issue-stable for continued outpatient monitoring by PCP.  Disposition: Remain inpatient  Antimicrobial agents  See below  Anti-infectives    Start     Dose/Rate Route Frequency Ordered Stop   10/01/15 0700  levofloxacin (LEVAQUIN) tablet 750 mg     750 mg Oral Every 48 hours 09/30/15 1312     09/29/15 0700  levofloxacin (LEVAQUIN) IVPB 750 mg  Status:  Discontinued     750 mg 100 mL/hr over 90  Minutes Intravenous Every 48 hours 09/27/15 1045 09/30/15 1312   09/27/15 1045  levofloxacin (LEVAQUIN) IVPB 750 mg  Status:  Discontinued     750 mg 100 mL/hr over 90 Minutes Intravenous Every 24 hours 09/27/15 1033 09/27/15 1045   09/27/15 0700  levofloxacin (LEVAQUIN) IVPB 500 mg     500 mg 100 mL/hr over 60 Minutes Intravenous  Once 09/27/15 0640 09/27/15 0803      DVT Prophylaxis: Prophylactic Lovenox  Code Status: Full code   Family Communication None at bedside  Procedures: None  CONSULTS:  None  Time spent 30 minutes-Greater than 50% of this time was spent in counseling, explanation of diagnosis, planning of further management, and coordination of care.  MEDICATIONS: Scheduled Meds: . aspirin  81 mg Oral QPC lunch  . budesonide  0.25 mg Nebulization BID  . carvedilol  6.25 mg Oral BID WC  . enoxaparin (LOVENOX) injection  40 mg Subcutaneous Q24H  . escitalopram  10 mg Oral Daily  . famotidine  20 mg Oral Daily  . fluticasone  2 spray Each Nare Daily  . furosemide  40 mg Intravenous BID  . gabapentin  100 mg Oral BID  . guaiFENesin  600 mg Oral BID  . insulin aspart  0-15 Units Subcutaneous TID WC  . insulin aspart  0-5 Units Subcutaneous QHS  . ipratropium-albuterol  3 mL Nebulization TID  . levofloxacin  750 mg Oral Q48H  . loratadine  10 mg Oral Daily  . methylPREDNISolone (SOLU-MEDROL) injection  60 mg Intravenous Q12H  . montelukast  10 mg Oral QHS  . multivitamin with minerals  1 tablet Oral QPC lunch  . oxymetazoline  1  spray Each Nare BID  . potassium chloride  20 mEq Oral BID  . pravastatin  80 mg Oral q1800  . sodium chloride flush  3 mL Intravenous Q12H   Continuous Infusions:  PRN Meds:.sodium chloride, acetaminophen, benzonatate, docusate sodium, hydrALAZINE, ipratropium-albuterol, LORazepam, ondansetron (ZOFRAN) IV, oxyCODONE-acetaminophen, sodium chloride flush    PHYSICAL EXAM: Vital signs in last 24 hours: Filed Vitals:   10/02/15  0830 10/02/15 0833 10/02/15 1300 10/02/15 1450  BP:   134/76   Pulse:   74   Temp:   97.6 F (36.4 C)   TempSrc:   Oral   Resp:   18   Height:      Weight:      SpO2: 97% 97% 98% 98%    Weight change: 0.544 kg (1 lb 3.2 oz) Filed Weights   09/30/15 0624 10/01/15 0425 10/02/15 0656  Weight: 79.017 kg (174 lb 3.2 oz) 80.287 kg (177 lb) 80.831 kg (178 lb 3.2 oz)   Body mass index is 28.78 kg/(m^2).   Gen Exam: Awake and alert with clear speech.   Neck: Supple, + JVD.   Chest: Coarse rhonchi all over CVS: S1 S2 Regular, no murmurs.  Abdomen: soft, BS +, non tender, non distended.  Extremities: + edema, lower extremities warm to touch. Neurologic: Non Focal.   Skin: No Rash.   Wounds: N/A.   Intake/Output from previous day:  Intake/Output Summary (Last 24 hours) at 10/02/15 1451 Last data filed at 10/02/15 1300  Gross per 24 hour  Intake   2723 ml  Output   1026 ml  Net   1697 ml     LAB RESULTS: CBC  Recent Labs Lab 09/27/15 0435 10/02/15 0944  WBC 8.5 7.8  HGB 11.3* 11.4*  HCT 34.8* 32.4*  PLT 215 175  MCV 96.7 94.7  MCH 31.4 33.3  MCHC 32.5 35.2  RDW 12.6 12.2  LYMPHSABS 0.6*  --   MONOABS 0.7  --   EOSABS 0.0  --   BASOSABS 0.0  --     Chemistries   Recent Labs Lab 09/28/15 0519 09/29/15 0403 09/30/15 0530 10/01/15 0302 10/02/15 0944  NA 129* 125* 124* 126* 124*  K 5.1 5.1 5.1 4.9 4.5  CL 92* 91* 92* 91* 87*  CO2 26 27 26 26 25   GLUCOSE 125* 132* 127* 111* 174*  BUN 35* 38* 31* 38* 36*  CREATININE 1.40* 1.18* 0.99 1.04* 1.12*  CALCIUM 8.3* 7.9* 7.7* 7.9* 7.7*    CBG:  Recent Labs Lab 10/01/15 1220 10/01/15 1612 10/01/15 2130 10/02/15 0559 10/02/15 1153  GLUCAP 146* 109* 142* 96 128*    GFR Estimated Creatinine Clearance: 40.1 mL/min (by C-G formula based on Cr of 1.12).  Coagulation profile No results for input(s): INR, PROTIME in the last 168 hours.  Cardiac Enzymes No results for input(s): CKMB, TROPONINI, MYOGLOBIN  in the last 168 hours.  Invalid input(s): CK  Invalid input(s): POCBNP No results for input(s): DDIMER in the last 72 hours. No results for input(s): HGBA1C in the last 72 hours. No results for input(s): CHOL, HDL, LDLCALC, TRIG, CHOLHDL, LDLDIRECT in the last 72 hours. No results for input(s): TSH, T4TOTAL, T3FREE, THYROIDAB in the last 72 hours.  Invalid input(s): FREET3 No results for input(s): VITAMINB12, FOLATE, FERRITIN, TIBC, IRON, RETICCTPCT in the last 72 hours. No results for input(s): LIPASE, AMYLASE in the last 72 hours.  Urine Studies No results for input(s): UHGB, CRYS in the last 72 hours.  Invalid input(s): UACOL,  UAPR, USPG, UPH, UTP, UGL, UKET, UBIL, UNIT, UROB, ULEU, UEPI, UWBC, URBC, UBAC, CAST, UCOM, BILUA  MICROBIOLOGY: Recent Results (from the past 240 hour(s))  Blood culture (routine x 2)     Status: None   Collection Time: 09/27/15  4:20 AM  Result Value Ref Range Status   Specimen Description BLOOD LEFT ARM  Final   Special Requests BOTTLES DRAWN AEROBIC AND ANAEROBIC 10ML  Final   Culture NO GROWTH 5 DAYS  Final   Report Status 10/02/2015 FINAL  Final  Blood culture (routine x 2)     Status: None   Collection Time: 09/27/15  4:48 AM  Result Value Ref Range Status   Specimen Description BLOOD LEFT WRIST  Final   Special Requests BOTTLES DRAWN AEROBIC ONLY 6ML  Final   Culture NO GROWTH 5 DAYS  Final   Report Status 10/02/2015 FINAL  Final    RADIOLOGY STUDIES/RESULTS: Dg Chest 2 View  09/28/2015  CLINICAL DATA:  Acute respiratory failure with hypoxia EXAM: CHEST  2 VIEW COMPARISON:  Chest radiograph from one day prior. FINDINGS: Stable cardiomediastinal silhouette with mild cardiomegaly. No pneumothorax. No pleural effusion. Mild patchy and curvilinear opacities at the right lung apex and right lung base, slightly improved. IMPRESSION: Slightly improved curvilinear and patchy opacities at the right lung apex and right lung base, favor  bronchitis/bronchopneumonia. Cannot exclude a component of mild pulmonary edema given the cardiomegaly. Electronically Signed   By: Ilona Sorrel M.D.   On: 09/28/2015 10:50   Dg Chest 2 View  09/24/2015  CLINICAL DATA:  Congestion, cough and exertional shortness breath for 3 days. History of CHF. EXAM: CHEST  2 VIEW COMPARISON:  Chest x-rays dated 01/26/2015 and 03/01/2014. FINDINGS: Mild cardiomegaly is stable. Overall cardiomediastinal silhouette is stable in size and configuration. Prominence of the pulmonary arteries suggests chronic pulmonary artery hypertension. Coarse interstitial markings are again seen within each lung, similar to the previous exam of 01/26/2015. No evidence of a superimposed interstitial edema on today's exam, as was seen on earlier chest x-ray of 01/18/2015. No confluent airspace opacity to suggest a developing pneumonia. No pleural effusions seen. Degenerative changes are again noted throughout the kyphotic thoracic spine. Multiple chronic compression fracture deformities again appreciated within the thoracic spine. No acute- appearing osseous abnormality. IMPRESSION: Stable chest x-ray. No evidence of acute cardiopulmonary abnormality. Stable cardiomegaly. Evidence of chronic interstitial lung disease. Evidence of chronic pulmonary artery hypertension. Multiple old compression fracture deformities within thoracic spine. Electronically Signed   By: Franki Cabot M.D.   On: 09/24/2015 19:02   Ct Chest Wo Contrast  09/25/2015  CLINICAL DATA:  Fever and productive cough. Isolated left upper lobe wheezing. Evaluate for pneumonia EXAM: CT CHEST WITHOUT CONTRAST TECHNIQUE: Multidetector CT imaging of the chest was performed following the standard protocol without IV contrast. COMPARISON:  01/19/2015 FINDINGS: THORACIC INLET/BODY WALL: Right mastectomy. MEDIASTINUM: Chronic cardiomegaly with trace pericardial fluid or thickening. No evidence of acute vascular disease. Diffuse  atherosclerosis, including the coronary arteries. No adenopathy. Negative esophagus. LUNG WINDOWS: Mild dependent atelectasis or scar. Trace pleural effusions. Lungs have a stable appearance since 2016. There is no edema, consolidation, effusion, or pneumothorax. UPPER ABDOMEN: Chronic 3 cm right adrenal mass, benign given stability and attributed to adenoma. Benign hepatic low densities which are small. OSSEOUS: Remote compression fractures at T1, T6, T8, and T12. The T6 and T12 fractures have advanced height loss with vertebra plana. No acute osseous finding. IMPRESSION: 1. Negative for pneumonia.  Stable exam  compared to July 2016. 2. Chronic basilar atelectasis/ scarring and trace effusions. Electronically Signed   By: Monte Fantasia M.D.   On: 09/25/2015 02:30   Dg Chest Port 1 View  09/29/2015  CLINICAL DATA:  Productive cough for the past 6 days.  Slight fever. EXAM: PORTABLE CHEST 1 VIEW COMPARISON:  Yesterday. FINDINGS: Stable enlarged cardiac silhouette and prominence of the interstitial markings. Small amount of linear density in the right lower lung zone with little change. Otherwise, no airspace opacity. Diffuse osteopenia. Old, healed right humeral neck fracture. IMPRESSION: Stable mild cardiomegaly, mild interstitial lung disease mild right basilar atelectasis or scarring Electronically Signed   By: Claudie Revering M.D.   On: 09/29/2015 09:56   Dg Chest Portable 1 View  09/27/2015  CLINICAL DATA:  Shortness of breath EXAM: PORTABLE CHEST 1 VIEW COMPARISON:  09/24/2015 FINDINGS: There is symmetric and diffuse interstitial coarsening above baseline. Chronic hyperinflation and cardiomegaly. Negative aortic and hilar contours. No effusion or air leak. IMPRESSION: Progressive interstitial coarsening which could be bronchitic or congestive. Electronically Signed   By: Monte Fantasia M.D.   On: 09/27/2015 04:41   Dg Chest Port 1v Same Day  10/02/2015  CLINICAL DATA:  Shortness of breath EXAM:  PORTABLE CHEST 1 VIEW COMPARISON:  09/29/15 chest radiograph. FINDINGS: Stable cardiomediastinal silhouette with mild cardiomegaly. No pneumothorax. Probable new trace bilateral pleural effusions. Stable mild pulmonary edema. Stable mild platelike atelectasis at both lung bases. IMPRESSION: 1. Stable mild congestive heart failure. 2. Probable new trace bilateral pleural effusions. 3. Stable mild platelike atelectasis at both lung bases. Electronically Signed   By: Ilona Sorrel M.D.   On: 10/02/2015 10:45    Oren Binet, MD  [facility] Pager:[phone]  If 7PM-7AM, please contact night-coverage [email or web address] Password [email or web address] 10/02/2015, 2:51 PM   LOS: 5 days

## 2015-10-02 NOTE — Consult Note (Signed)
[facility] CM Inpatient Consult   10/02/2015  Haley Harris 12/13/1928 [id number] Follow up from progression meeting regarding patient's benefit for possible tele-monitoring through her [facility] and for post hospital [place] [facility] monitoring.  Met with the patient and explained services.  Patient states she feels confident that the calls from her [facility] will be all she needed at this time. Patient feels that she has no transportation needs and good support post hospital.  She states that her scales are very good and felt no other type of monitoring is needed at this time.  Patient requested a brochure and contact information and it was given.  No further needs identified. Natividad Brood, RN BSN [facility] Liaison  [phone] business mobile phone Toll free office [phone]

## 2015-10-03 ENCOUNTER — Ambulatory Visit: Payer: Medicare Other | Admitting: Internal Medicine

## 2015-10-03 DIAGNOSIS — J440 Chronic obstructive pulmonary disease with acute lower respiratory infection: Secondary | ICD-10-CM

## 2015-10-03 LAB — BASIC METABOLIC PANEL
ANION GAP: 8 (ref 5–15)
BUN: 35 mg/dL — AB (ref 6–20)
CHLORIDE: 90 mmol/L — AB (ref 101–111)
CO2: 28 mmol/L (ref 22–32)
Calcium: 7.7 mg/dL — ABNORMAL LOW (ref 8.9–10.3)
Creatinine, Ser: 0.94 mg/dL (ref 0.44–1.00)
GFR calc Af Amer: >60 mL/min (ref >60)
GFR calc non Af Amer: 54 mL/min — ABNORMAL LOW (ref >60)
GLUCOSE: 98 mg/dL (ref 65–99)
Potassium: 4.9 mmol/L (ref 3.5–5.1)
Sodium: 126 mmol/L — ABNORMAL LOW (ref 135–145)

## 2015-10-03 LAB — GLUCOSE, CAPILLARY
GLUCOSE-CAPILLARY: 91 mg/dL (ref 65–99)
GLUCOSE-CAPILLARY: 154 mg/dL — AB (ref 65–99)
Glucose-Capillary: 153 mg/dL — ABNORMAL HIGH (ref 65–99)
Glucose-Capillary: 160 mg/dL — ABNORMAL HIGH (ref 65–99)

## 2015-10-03 MED ORDER — GUAIFENESIN ER 600 MG PO TB12
1200.0000 mg | ORAL_TABLET | Freq: Two times a day (BID) | ORAL | Status: DC
  Administered 2015-10-03 – 2015-10-09 (×13): 1200 mg via ORAL
  Filled 2015-10-03 (×14): qty 2

## 2015-10-03 MED ORDER — FUROSEMIDE 10 MG/ML IJ SOLN
40.0000 mg | Freq: Two times a day (BID) | INTRAMUSCULAR | Status: DC
  Administered 2015-10-03 (×2): 40 mg via INTRAVENOUS
  Filled 2015-10-03 (×2): qty 4

## 2015-10-03 MED ORDER — IPRATROPIUM-ALBUTEROL 0.5-2.5 (3) MG/3ML IN SOLN
3.0000 mL | Freq: Three times a day (TID) | RESPIRATORY_TRACT | Status: DC
  Administered 2015-10-04 – 2015-10-08 (×15): 3 mL via RESPIRATORY_TRACT
  Filled 2015-10-03 (×15): qty 3

## 2015-10-03 NOTE — Progress Notes (Signed)
Physical Therapy Treatment Patient Details Name: Haley Harris MRN: [id number] DOB: 31-Aug-1928 Today's Date: 10/03/2015    History of Present Illness 82 year old female with a past medical history significant for CHF and COPD; who presents with three-day history of shortness of breath; Acute respiratory failure with hypoxia secondary to COPD with acute bronchitis from influenza and CAP without sepsis    PT Comments    Patient seen for mobility progression and safety with mobility. Tolerated well. Ambulated increased distance in hall on 3 liter O2 without any noted DOE. Attempted to get O2 saturations, but could not get a reading. Patient reports feeling much better today. Still noted to have wet cough throughout activity. Will continue to see and progress as tolerated.  Follow Up Recommendations  Home health PT;Supervision for mobility/OOB     Equipment Recommendations  None recommended by PT    Recommendations for Other Services       Precautions / Restrictions Precautions Precautions: Fall Precaution Comments: watch O2 sats Restrictions Weight Bearing Restrictions: No    Mobility  Bed Mobility Overal bed mobility: Modified Independent             General bed mobility comments: increased time to perform, no physical assist required.   Transfers Overall transfer level: Needs assistance Equipment used: None Transfers: Sit to/from Stand Sit to Stand: Supervision Stand pivot transfers: Supervision       General transfer comment: Pivot to bedside commode prior to ambulation activity.  Ambulation/Gait Ambulation/Gait assistance: Supervision Ambulation Distance (Feet): 120 Feet Assistive device: Rolling walker (2 wheeled) Gait Pattern/deviations: Step-through pattern;Decreased stride length;Trunk flexed;Drifts right/left Gait velocity: decreased Gait velocity interpretation: Below normal speed for age/gender General Gait Details: ambulated on 3 liters, no noted DOE  at this time. tolerated well. Cues for upright posture during ambulation.    Stairs            Wheelchair Mobility    Modified Rankin (Stroke Patients Only)       Balance     Sitting balance-Leahy Scale: Good     Standing balance support: During functional activity Standing balance-Leahy Scale: Fair                      Cognition Arousal/Alertness: Awake/alert Behavior During Therapy: WFL for tasks assessed/performed Overall Cognitive Status: Within Functional Limits for tasks assessed                      Exercises      General Comments        Pertinent Vitals/Pain      Home Living                      Prior Function            PT Goals (current goals can now be found in the care plan section) Acute Rehab PT Goals Patient Stated Goal: to go home PT Goal Formulation: With patient Time For Goal Achievement: 10/16/15 Potential to Achieve Goals: Good Progress towards PT goals: Progressing toward goals    Frequency  Min 3X/week    PT Plan Current plan remains appropriate    Co-evaluation             End of Session Equipment Utilized During Treatment: Gait belt;Oxygen Activity Tolerance: Patient limited by fatigue;Patient tolerated treatment well Patient left: in chair;with call bell/phone within reach     Time: 0921-0943 PT Time Calculation (min) (ACUTE ONLY): 22 min  Charges:  $Gait Training: 8-22 mins                    G CodesDuncan Dull 10/08/2015, 11:24 AM Alben Deeds, PT DPT  [phone]

## 2015-10-03 NOTE — Progress Notes (Addendum)
PATIENT DETAILS Name: Haley Harris Age: 82 y.o. Sex: female Date of Birth: [birth year] Admit Date: 09/27/2015 Admitting Physician Geradine Girt, DO [id number] Silvio Pate, MD  Subjective: Feels slightly better than yesterday. Still coughing and wheezing. Nasal congestion significantly better. omewhat better  Assessment/Plan: Principal Problem: Acute respiratory failure with hypoxia secondary to acute bronchitis: Secondary to acute bronchitis from influenza. Slowly improving-continues to have expiratory rhonchi.Continue IV steroids, IV Levaquin-stop date 3/23, nebulized bronchodilators.Continue Flonase/Afrin and mucinex. Encourage incentive spirometry, flutter valve-chest PT to mobilize secretions.  Active Problems: Influenza A: Likely causing above, since already improving-and since more than 5 days from initial symptoms-we will continue to monitor off Tamiflu- continue to provide supportive care.  Acute on chronic systolic heart failure (EF by TTE July 2016-40%): Unfortunately developed edema, weight has steadily increased to 178 pounds (170 pounds on admission)-in spite of being on oral Lasix. Started on IV Lasix on 3/23, weight 173 pounds, urine output 2 L yesterday. Still with volume on board- Continue IV Lasix and follow.   Hyponatremia: Likely secondary to acute systolic heart failure. Probing with fluid restriction and IV Lasix. Follow   History of CAD: Currently stable without any chest pain or shortness of breath. Continue aspirin, Coreg and pravastatin.  Hypertension: Controlled with Coreg, resume losartan in the next few days.  CKD stage III: Creatinine close to usual baseline, follow.  Steroid-induced hyperglycemia: Continue SSI while on steroids. A1c 6.0.  History of right adrenal mass:Chronic issue-stable for continued outpatient monitoring by PCP.  Disposition: Remain inpatient-suspect requires 2-3 more days of hospitalization  Antimicrobial  agents  See below  Anti-infectives    Start     Dose/Rate Route Frequency Ordered Stop   10/01/15 0700  levofloxacin (LEVAQUIN) tablet 750 mg     750 mg Oral Every 48 hours 09/30/15 1312     09/29/15 0700  levofloxacin (LEVAQUIN) IVPB 750 mg  Status:  Discontinued     750 mg 100 mL/hr over 90 Minutes Intravenous Every 48 hours 09/27/15 1045 09/30/15 1312   09/27/15 1045  levofloxacin (LEVAQUIN) IVPB 750 mg  Status:  Discontinued     750 mg 100 mL/hr over 90 Minutes Intravenous Every 24 hours 09/27/15 1033 09/27/15 1045   09/27/15 0700  levofloxacin (LEVAQUIN) IVPB 500 mg     500 mg 100 mL/hr over 60 Minutes Intravenous  Once 09/27/15 0640 09/27/15 0803      DVT Prophylaxis: Prophylactic Lovenox  Code Status: Full code   Family Communication None at bedside  Procedures: None  CONSULTS:  None  Time spent 25 minutes-Greater than 50% of this time was spent in counseling, explanation of diagnosis, planning of further management, and coordination of care.  MEDICATIONS: Scheduled Meds: . aspirin  81 mg Oral QPC lunch  . budesonide  0.25 mg Nebulization BID  . carvedilol  6.25 mg Oral BID WC  . enoxaparin (LOVENOX) injection  40 mg Subcutaneous Q24H  . escitalopram  10 mg Oral Daily  . famotidine  20 mg Oral Daily  . fluticasone  2 spray Each Nare Daily  . furosemide  40 mg Intravenous BID  . gabapentin  100 mg Oral BID  . guaiFENesin  1,200 mg Oral BID  . insulin aspart  0-15 Units Subcutaneous TID WC  . insulin aspart  0-5 Units Subcutaneous QHS  . ipratropium-albuterol  3 mL Nebulization Q6H  . levofloxacin  750 mg Oral Q48H  .  loratadine  10 mg Oral Daily  . methylPREDNISolone (SOLU-MEDROL) injection  60 mg Intravenous Q12H  . montelukast  10 mg Oral QHS  . multivitamin with minerals  1 tablet Oral QPC lunch  . oxymetazoline  1 spray Each Nare BID  . potassium chloride  20 mEq Oral BID  . pravastatin  80 mg Oral q1800  . sodium chloride flush  3 mL Intravenous  Q12H   Continuous Infusions:  PRN Meds:.sodium chloride, acetaminophen, benzonatate, docusate sodium, hydrALAZINE, ipratropium-albuterol, LORazepam, ondansetron (ZOFRAN) IV, oxyCODONE-acetaminophen, sodium chloride flush    PHYSICAL EXAM: Vital signs in last 24 hours: Filed Vitals:   10/03/15 0642 10/03/15 0830 10/03/15 0833 10/03/15 0850  BP: 169/75   182/82  Pulse: 68   82  Temp: 97.3 F (36.3 C)     TempSrc: Oral     Resp: 16   20  Height:      Weight: 78.608 kg (173 lb 4.8 oz)     SpO2: 99% 100% 100%     Weight change: -2.223 kg (-4 lb 14.4 oz) Filed Weights   10/01/15 0425 10/02/15 0656 10/03/15 0642  Weight: 80.287 kg (177 lb) 80.831 kg (178 lb 3.2 oz) 78.608 kg (173 lb 4.8 oz)   Body mass index is 27.98 kg/(m^2).   Gen Exam: Awake and alert with clear speech.  Appears comfortable Neck: Supple, + JVD.   Chest: Still with Coarse rhonchi all over CVS: S1 S2 Regular, no murmurs.  Abdomen: soft, BS +, non tender, non distended.  Extremities: + edema, lower extremities warm to touch. Neurologic: Non Focal.   Skin: No Rash.   Wounds: N/A.   Intake/Output from previous day:  Intake/Output Summary (Last 24 hours) at 10/03/15 1113 Last data filed at 10/03/15 1044  Gross per 24 hour  Intake   2725 ml  Output   1626 ml  Net   1099 ml     LAB RESULTS: CBC  Recent Labs Lab 09/27/15 0435 10/02/15 0944  WBC 8.5 7.8  HGB 11.3* 11.4*  HCT 34.8* 32.4*  PLT 215 175  MCV 96.7 94.7  MCH 31.4 33.3  MCHC 32.5 35.2  RDW 12.6 12.2  LYMPHSABS 0.6*  --   MONOABS 0.7  --   EOSABS 0.0  --   BASOSABS 0.0  --     Chemistries   Recent Labs Lab 09/29/15 0403 09/30/15 0530 10/01/15 0302 10/02/15 0944 10/03/15 0628  NA 125* 124* 126* 124* 126*  K 5.1 5.1 4.9 4.5 4.9  CL 91* 92* 91* 87* 90*  CO2 27 26 26 25 28   GLUCOSE 132* 127* 111* 174* 98  BUN 38* 31* 38* 36* 35*  CREATININE 1.18* 0.99 1.04* 1.12* 0.94  CALCIUM 7.9* 7.7* 7.9* 7.7* 7.7*    CBG:  Recent  Labs Lab 10/02/15 0559 10/02/15 1153 10/02/15 1709 10/02/15 2152 10/03/15 0642  GLUCAP 96 128* 125* 159* 91    GFR Estimated Creatinine Clearance: 47.1 mL/min (by C-G formula based on Cr of 0.94).  Coagulation profile No results for input(s): INR, PROTIME in the last 168 hours.  Cardiac Enzymes No results for input(s): CKMB, TROPONINI, MYOGLOBIN in the last 168 hours.  Invalid input(s): CK  Invalid input(s): POCBNP No results for input(s): DDIMER in the last 72 hours. No results for input(s): HGBA1C in the last 72 hours. No results for input(s): CHOL, HDL, LDLCALC, TRIG, CHOLHDL, LDLDIRECT in the last 72 hours. No results for input(s): TSH, T4TOTAL, T3FREE, THYROIDAB in the last 72 hours.  Invalid input(s): FREET3 No results for input(s): VITAMINB12, FOLATE, FERRITIN, TIBC, IRON, RETICCTPCT in the last 72 hours. No results for input(s): LIPASE, AMYLASE in the last 72 hours.  Urine Studies No results for input(s): UHGB, CRYS in the last 72 hours.  Invalid input(s): UACOL, UAPR, USPG, UPH, UTP, UGL, UKET, UBIL, UNIT, UROB, ULEU, UEPI, UWBC, URBC, UBAC, CAST, UCOM, BILUA  MICROBIOLOGY: Recent Results (from the past 240 hour(s))  Blood culture (routine x 2)     Status: None   Collection Time: 09/27/15  4:20 AM  Result Value Ref Range Status   Specimen Description BLOOD LEFT ARM  Final   Special Requests BOTTLES DRAWN AEROBIC AND ANAEROBIC 10ML  Final   Culture NO GROWTH 5 DAYS  Final   Report Status 10/02/2015 FINAL  Final  Blood culture (routine x 2)     Status: None   Collection Time: 09/27/15  4:48 AM  Result Value Ref Range Status   Specimen Description BLOOD LEFT WRIST  Final   Special Requests BOTTLES DRAWN AEROBIC ONLY 6ML  Final   Culture NO GROWTH 5 DAYS  Final   Report Status 10/02/2015 FINAL  Final    RADIOLOGY STUDIES/RESULTS: Dg Chest 2 View  09/28/2015  CLINICAL DATA:  Acute respiratory failure with hypoxia EXAM: CHEST  2 VIEW COMPARISON:  Chest  radiograph from one day prior. FINDINGS: Stable cardiomediastinal silhouette with mild cardiomegaly. No pneumothorax. No pleural effusion. Mild patchy and curvilinear opacities at the right lung apex and right lung base, slightly improved. IMPRESSION: Slightly improved curvilinear and patchy opacities at the right lung apex and right lung base, favor bronchitis/bronchopneumonia. Cannot exclude a component of mild pulmonary edema given the cardiomegaly. Electronically Signed   By: Ilona Sorrel M.D.   On: 09/28/2015 10:50   Dg Chest 2 View  09/24/2015  CLINICAL DATA:  Congestion, cough and exertional shortness breath for 3 days. History of CHF. EXAM: CHEST  2 VIEW COMPARISON:  Chest x-rays dated 01/26/2015 and 03/01/2014. FINDINGS: Mild cardiomegaly is stable. Overall cardiomediastinal silhouette is stable in size and configuration. Prominence of the pulmonary arteries suggests chronic pulmonary artery hypertension. Coarse interstitial markings are again seen within each lung, similar to the previous exam of 01/26/2015. No evidence of a superimposed interstitial edema on today's exam, as was seen on earlier chest x-ray of 01/18/2015. No confluent airspace opacity to suggest a developing pneumonia. No pleural effusions seen. Degenerative changes are again noted throughout the kyphotic thoracic spine. Multiple chronic compression fracture deformities again appreciated within the thoracic spine. No acute- appearing osseous abnormality. IMPRESSION: Stable chest x-ray. No evidence of acute cardiopulmonary abnormality. Stable cardiomegaly. Evidence of chronic interstitial lung disease. Evidence of chronic pulmonary artery hypertension. Multiple old compression fracture deformities within thoracic spine. Electronically Signed   By: Franki Cabot M.D.   On: 09/24/2015 19:02   Ct Chest Wo Contrast  09/25/2015  CLINICAL DATA:  Fever and productive cough. Isolated left upper lobe wheezing. Evaluate for pneumonia EXAM: CT  CHEST WITHOUT CONTRAST TECHNIQUE: Multidetector CT imaging of the chest was performed following the standard protocol without IV contrast. COMPARISON:  01/19/2015 FINDINGS: THORACIC INLET/BODY WALL: Right mastectomy. MEDIASTINUM: Chronic cardiomegaly with trace pericardial fluid or thickening. No evidence of acute vascular disease. Diffuse atherosclerosis, including the coronary arteries. No adenopathy. Negative esophagus. LUNG WINDOWS: Mild dependent atelectasis or scar. Trace pleural effusions. Lungs have a stable appearance since 2016. There is no edema, consolidation, effusion, or pneumothorax. UPPER ABDOMEN: Chronic 3 cm right adrenal mass, benign  given stability and attributed to adenoma. Benign hepatic low densities which are small. OSSEOUS: Remote compression fractures at T1, T6, T8, and T12. The T6 and T12 fractures have advanced height loss with vertebra plana. No acute osseous finding. IMPRESSION: 1. Negative for pneumonia.  Stable exam compared to July 2016. 2. Chronic basilar atelectasis/ scarring and trace effusions. Electronically Signed   By: Monte Fantasia M.D.   On: 09/25/2015 02:30   Dg Chest Port 1 View  09/29/2015  CLINICAL DATA:  Productive cough for the past 6 days.  Slight fever. EXAM: PORTABLE CHEST 1 VIEW COMPARISON:  Yesterday. FINDINGS: Stable enlarged cardiac silhouette and prominence of the interstitial markings. Small amount of linear density in the right lower lung zone with little change. Otherwise, no airspace opacity. Diffuse osteopenia. Old, healed right humeral neck fracture. IMPRESSION: Stable mild cardiomegaly, mild interstitial lung disease mild right basilar atelectasis or scarring Electronically Signed   By: Claudie Revering M.D.   On: 09/29/2015 09:56   Dg Chest Portable 1 View  09/27/2015  CLINICAL DATA:  Shortness of breath EXAM: PORTABLE CHEST 1 VIEW COMPARISON:  09/24/2015 FINDINGS: There is symmetric and diffuse interstitial coarsening above baseline. Chronic  hyperinflation and cardiomegaly. Negative aortic and hilar contours. No effusion or air leak. IMPRESSION: Progressive interstitial coarsening which could be bronchitic or congestive. Electronically Signed   By: Monte Fantasia M.D.   On: 09/27/2015 04:41   Dg Chest Port 1v Same Day  10/02/2015  CLINICAL DATA:  Shortness of breath EXAM: PORTABLE CHEST 1 VIEW COMPARISON:  09/29/15 chest radiograph. FINDINGS: Stable cardiomediastinal silhouette with mild cardiomegaly. No pneumothorax. Probable new trace bilateral pleural effusions. Stable mild pulmonary edema. Stable mild platelike atelectasis at both lung bases. IMPRESSION: 1. Stable mild congestive heart failure. 2. Probable new trace bilateral pleural effusions. 3. Stable mild platelike atelectasis at both lung bases. Electronically Signed   By: Ilona Sorrel M.D.   On: 10/02/2015 10:45    Oren Binet, MD  [facility] Pager:[phone]  If 7PM-7AM, please contact night-coverage [email or web address] Password [email or web address] 10/03/2015, 11:13 AM   LOS: 6 days

## 2015-10-04 DIAGNOSIS — N183 Chronic kidney disease, stage 3 (moderate): Secondary | ICD-10-CM

## 2015-10-04 LAB — BASIC METABOLIC PANEL
Anion gap: 7 (ref 5–15)
BUN: 34 mg/dL — ABNORMAL HIGH (ref 6–20)
CHLORIDE: 90 mmol/L — AB (ref 101–111)
CO2: 30 mmol/L (ref 22–32)
CREATININE: 1.19 mg/dL — AB (ref 0.44–1.00)
Calcium: 7.5 mg/dL — ABNORMAL LOW (ref 8.9–10.3)
GFR calc non Af Amer: 41 mL/min — ABNORMAL LOW (ref >60)
GFR, EST AFRICAN AMERICAN: 47 mL/min — AB (ref >60)
Glucose, Bld: 130 mg/dL — ABNORMAL HIGH (ref 65–99)
Potassium: 4.6 mmol/L (ref 3.5–5.1)
Sodium: 127 mmol/L — ABNORMAL LOW (ref 135–145)

## 2015-10-04 LAB — GLUCOSE, CAPILLARY
GLUCOSE-CAPILLARY: 176 mg/dL — AB (ref 65–99)
GLUCOSE-CAPILLARY: 114 mg/dL — AB (ref 65–99)
GLUCOSE-CAPILLARY: 139 mg/dL — AB (ref 65–99)
Glucose-Capillary: 120 mg/dL — ABNORMAL HIGH (ref 65–99)

## 2015-10-04 MED ORDER — INSULIN ASPART 100 UNIT/ML ~~LOC~~ SOLN
0.0000 [IU] | Freq: Three times a day (TID) | SUBCUTANEOUS | Status: DC
  Administered 2015-10-05: 3 [IU] via SUBCUTANEOUS
  Administered 2015-10-06 – 2015-10-08 (×6): 2 [IU] via SUBCUTANEOUS
  Administered 2015-10-09: 3 [IU] via SUBCUTANEOUS

## 2015-10-04 MED ORDER — SPIRONOLACTONE 25 MG PO TABS
12.5000 mg | ORAL_TABLET | Freq: Every day | ORAL | Status: DC
  Administered 2015-10-04 – 2015-10-09 (×6): 12.5 mg via ORAL
  Filled 2015-10-04 (×6): qty 1

## 2015-10-04 MED ORDER — FUROSEMIDE 40 MG PO TABS
40.0000 mg | ORAL_TABLET | Freq: Once | ORAL | Status: AC
  Administered 2015-10-04: 40 mg via ORAL
  Filled 2015-10-04: qty 1

## 2015-10-04 MED ORDER — METHYLPREDNISOLONE SODIUM SUCC 40 MG IJ SOLR
40.0000 mg | Freq: Two times a day (BID) | INTRAMUSCULAR | Status: DC
  Administered 2015-10-04 – 2015-10-06 (×4): 40 mg via INTRAVENOUS
  Filled 2015-10-04 (×4): qty 1

## 2015-10-04 NOTE — Progress Notes (Signed)
PATIENT DETAILS Name: Haley Harris Age: 82 y.o. Sex: female Date of Birth: 25-Jan-1929 Admit Date: 09/27/2015 Admitting Physician Geradine Girt, DO Barney:[id number] Silvio Pate, MD  Subjective: Slowly improving-continues to have cough.   Assessment/Plan: Principal Problem: Acute respiratory failure with hypoxia secondary to acute bronchitis: Secondary to acute bronchitis from influenza. Slowly improving-continues to have expiratory rhonchi.Continue IV steroids-but taper, completed V Levaquin on 3/23, nebulized bronchodilators.Continue Flonase/Afrin and mucinex. Encourage incentive spirometry, flutter valve-chest PT to mobilize secretions. Attempt to titrate off O2  Active Problems: Influenza A: Likely causing above, since already improving-and since more than 5 days from initial symptoms-we will continue to monitor off Tamiflu- continue to provide supportive care-especially when she is improving.  Acute on chronic systolic heart failure (EF by TTE July 2016-40%): Much more compensated-weight decreased to 166 lbs-with slight increase in creatinine-will transition back to oral lasix and aldactone.   Hyponatremia: Likely secondary to acute systolic heart failure. Improving with fluid restriction and Lasix. Follow   History of CAD: Currently stable without any chest pain or shortness of breath. Continue aspirin, Coreg and pravastatin.  Hypertension: Controlled with Coreg, resume losartan in the next few days.  CKD stage III: Creatinine close to usual baseline, follow.  Steroid-induced hyperglycemia: Continue SSI while on steroids. A1c 6.0.  History of right adrenal mass:Chronic issue-stable for continued outpatient monitoring by PCP.  Disposition: Remain inpatient-suspect requires 2-3 more days of hospitalization  Antimicrobial agents  See below  Anti-infectives    Start     Dose/Rate Route Frequency Ordered Stop   10/01/15 0700  levofloxacin (LEVAQUIN) tablet 750 mg   Status:  Discontinued     750 mg Oral Every 48 hours 09/30/15 1312 10/03/15 1116   09/29/15 0700  levofloxacin (LEVAQUIN) IVPB 750 mg  Status:  Discontinued     750 mg 100 mL/hr over 90 Minutes Intravenous Every 48 hours 09/27/15 1045 09/30/15 1312   09/27/15 1045  levofloxacin (LEVAQUIN) IVPB 750 mg  Status:  Discontinued     750 mg 100 mL/hr over 90 Minutes Intravenous Every 24 hours 09/27/15 1033 09/27/15 1045   09/27/15 0700  levofloxacin (LEVAQUIN) IVPB 500 mg     500 mg 100 mL/hr over 60 Minutes Intravenous  Once 09/27/15 0640 09/27/15 0803      DVT Prophylaxis: Prophylactic Lovenox  Code Status: Full code   Family Communication None at bedside  Procedures: None  CONSULTS:  None  Time spent 25 minutes-Greater than 50% of this time was spent in counseling, explanation of diagnosis, planning of further management, and coordination of care.  MEDICATIONS: Scheduled Meds: . aspirin  81 mg Oral QPC lunch  . budesonide  0.25 mg Nebulization BID  . carvedilol  6.25 mg Oral BID WC  . enoxaparin (LOVENOX) injection  40 mg Subcutaneous Q24H  . escitalopram  10 mg Oral Daily  . famotidine  20 mg Oral Daily  . fluticasone  2 spray Each Nare Daily  . gabapentin  100 mg Oral BID  . guaiFENesin  1,200 mg Oral BID  . insulin aspart  0-15 Units Subcutaneous TID WC  . insulin aspart  0-5 Units Subcutaneous QHS  . ipratropium-albuterol  3 mL Nebulization TID  . loratadine  10 mg Oral Daily  . methylPREDNISolone (SOLU-MEDROL) injection  40 mg Intravenous Q12H  . montelukast  10 mg Oral QHS  . multivitamin with minerals  1 tablet Oral QPC lunch  .  oxymetazoline  1 spray Each Nare BID  . potassium chloride  20 mEq Oral BID  . pravastatin  80 mg Oral q1800  . sodium chloride flush  3 mL Intravenous Q12H  . spironolactone  12.5 mg Oral Daily   Continuous Infusions:  PRN Meds:.sodium chloride, acetaminophen, benzonatate, docusate sodium, hydrALAZINE, ipratropium-albuterol,  LORazepam, ondansetron (ZOFRAN) IV, oxyCODONE-acetaminophen, sodium chloride flush    PHYSICAL EXAM: Vital signs in last 24 hours: Filed Vitals:   10/03/15 2119 10/03/15 2123 10/04/15 0414 10/04/15 0916  BP:  149/78  131/69  Pulse:  76  89  Temp:  98.4 F (36.9 C)  98.2 F (36.8 C)  TempSrc:  Oral  Oral  Resp:    18  Height:      Weight:   75.66 kg (166 lb 12.8 oz)   SpO2: 98% 96%  96%    Weight change: -2.948 kg (-6 lb 8 oz) Filed Weights   10/02/15 0656 10/03/15 0642 10/04/15 0414  Weight: 80.831 kg (178 lb 3.2 oz) 78.608 kg (173 lb 4.8 oz) 75.66 kg (166 lb 12.8 oz)   Body mass index is 26.94 kg/(m^2).   Gen Exam: Awake and alert with clear speech.  Appears comfortable Neck: Supple   Chest: Still with Coarse rhonchi all over-but moving air well CVS: S1 S2 Regular, no murmurs.  Abdomen: soft, BS +, non tender, non distended.  Extremities: no edema, lower extremities warm to touch. Neurologic: Non Focal.   Skin: No Rash.   Wounds: N/A.   Intake/Output from previous day:  Intake/Output Summary (Last 24 hours) at 10/04/15 1323 Last data filed at 10/04/15 1018  Gross per 24 hour  Intake    480 ml  Output   1651 ml  Net  -1171 ml     LAB RESULTS: CBC  Recent Labs Lab 10/02/15 0944  WBC 7.8  HGB 11.4*  HCT 32.4*  PLT 175  MCV 94.7  MCH 33.3  MCHC 35.2  RDW 12.2    Chemistries   Recent Labs Lab 09/30/15 0530 10/01/15 0302 10/02/15 0944 10/03/15 0628 10/04/15 0545  NA 124* 126* 124* 126* 127*  K 5.1 4.9 4.5 4.9 4.6  CL 92* 91* 87* 90* 90*  CO2 26 26 25 28 30   GLUCOSE 127* 111* 174* 98 130*  BUN 31* 38* 36* 35* 34*  CREATININE 0.99 1.04* 1.12* 0.94 1.19*  CALCIUM 7.7* 7.9* 7.7* 7.7* 7.5*    CBG:  Recent Labs Lab 10/03/15 1142 10/03/15 1650 10/03/15 2136 10/04/15 0655 10/04/15 1232  GLUCAP 153* 160* 154* 120* 176*    GFR Estimated Creatinine Clearance: 36.6 mL/min (by C-G formula based on Cr of 1.19).  Coagulation profile No  results for input(s): INR, PROTIME in the last 168 hours.  Cardiac Enzymes No results for input(s): CKMB, TROPONINI, MYOGLOBIN in the last 168 hours.  Invalid input(s): CK  Invalid input(s): POCBNP No results for input(s): DDIMER in the last 72 hours. No results for input(s): HGBA1C in the last 72 hours. No results for input(s): CHOL, HDL, LDLCALC, TRIG, CHOLHDL, LDLDIRECT in the last 72 hours. No results for input(s): TSH, T4TOTAL, T3FREE, THYROIDAB in the last 72 hours.  Invalid input(s): FREET3 No results for input(s): VITAMINB12, FOLATE, FERRITIN, TIBC, IRON, RETICCTPCT in the last 72 hours. No results for input(s): LIPASE, AMYLASE in the last 72 hours.  Urine Studies No results for input(s): UHGB, CRYS in the last 72 hours.  Invalid input(s): UACOL, UAPR, USPG, UPH, UTP, UGL, UKET, UBIL, UNIT, UROB,  ULEU, UEPI, UWBC, URBC, UBAC, CAST, UCOM, BILUA  MICROBIOLOGY: Recent Results (from the past 240 hour(s))  Blood culture (routine x 2)     Status: None   Collection Time: 09/27/15  4:20 AM  Result Value Ref Range Status   Specimen Description BLOOD LEFT ARM  Final   Special Requests BOTTLES DRAWN AEROBIC AND ANAEROBIC 10ML  Final   Culture NO GROWTH 5 DAYS  Final   Report Status 10/02/2015 FINAL  Final  Blood culture (routine x 2)     Status: None   Collection Time: 09/27/15  4:48 AM  Result Value Ref Range Status   Specimen Description BLOOD LEFT WRIST  Final   Special Requests BOTTLES DRAWN AEROBIC ONLY 6ML  Final   Culture NO GROWTH 5 DAYS  Final   Report Status 10/02/2015 FINAL  Final    RADIOLOGY STUDIES/RESULTS: Dg Chest 2 View  09/28/2015  CLINICAL DATA:  Acute respiratory failure with hypoxia EXAM: CHEST  2 VIEW COMPARISON:  Chest radiograph from one day prior. FINDINGS: Stable cardiomediastinal silhouette with mild cardiomegaly. No pneumothorax. No pleural effusion. Mild patchy and curvilinear opacities at the right lung apex and right lung base, slightly improved.  IMPRESSION: Slightly improved curvilinear and patchy opacities at the right lung apex and right lung base, favor bronchitis/bronchopneumonia. Cannot exclude a component of mild pulmonary edema given the cardiomegaly. Electronically Signed   By: Ilona Sorrel M.D.   On: 09/28/2015 10:50   Dg Chest 2 View  09/24/2015  CLINICAL DATA:  Congestion, cough and exertional shortness breath for 3 days. History of CHF. EXAM: CHEST  2 VIEW COMPARISON:  Chest x-rays dated 01/26/2015 and 03/01/2014. FINDINGS: Mild cardiomegaly is stable. Overall cardiomediastinal silhouette is stable in size and configuration. Prominence of the pulmonary arteries suggests chronic pulmonary artery hypertension. Coarse interstitial markings are again seen within each lung, similar to the previous exam of 01/26/2015. No evidence of a superimposed interstitial edema on today's exam, as was seen on earlier chest x-ray of 01/18/2015. No confluent airspace opacity to suggest a developing pneumonia. No pleural effusions seen. Degenerative changes are again noted throughout the kyphotic thoracic spine. Multiple chronic compression fracture deformities again appreciated within the thoracic spine. No acute- appearing osseous abnormality. IMPRESSION: Stable chest x-ray. No evidence of acute cardiopulmonary abnormality. Stable cardiomegaly. Evidence of chronic interstitial lung disease. Evidence of chronic pulmonary artery hypertension. Multiple old compression fracture deformities within thoracic spine. Electronically Signed   By: Franki Cabot M.D.   On: 09/24/2015 19:02   Ct Chest Wo Contrast  09/25/2015  CLINICAL DATA:  Fever and productive cough. Isolated left upper lobe wheezing. Evaluate for pneumonia EXAM: CT CHEST WITHOUT CONTRAST TECHNIQUE: Multidetector CT imaging of the chest was performed following the standard protocol without IV contrast. COMPARISON:  01/19/2015 FINDINGS: THORACIC INLET/BODY WALL: Right mastectomy. MEDIASTINUM: Chronic  cardiomegaly with trace pericardial fluid or thickening. No evidence of acute vascular disease. Diffuse atherosclerosis, including the coronary arteries. No adenopathy. Negative esophagus. LUNG WINDOWS: Mild dependent atelectasis or scar. Trace pleural effusions. Lungs have a stable appearance since 2016. There is no edema, consolidation, effusion, or pneumothorax. UPPER ABDOMEN: Chronic 3 cm right adrenal mass, benign given stability and attributed to adenoma. Benign hepatic low densities which are small. OSSEOUS: Remote compression fractures at T1, T6, T8, and T12. The T6 and T12 fractures have advanced height loss with vertebra plana. No acute osseous finding. IMPRESSION: 1. Negative for pneumonia.  Stable exam compared to July 2016. 2. Chronic basilar atelectasis/ scarring  and trace effusions. Electronically Signed   By: Monte Fantasia M.D.   On: 09/25/2015 02:30   Dg Chest Port 1 View  09/29/2015  CLINICAL DATA:  Productive cough for the past 6 days.  Slight fever. EXAM: PORTABLE CHEST 1 VIEW COMPARISON:  Yesterday. FINDINGS: Stable enlarged cardiac silhouette and prominence of the interstitial markings. Small amount of linear density in the right lower lung zone with little change. Otherwise, no airspace opacity. Diffuse osteopenia. Old, healed right humeral neck fracture. IMPRESSION: Stable mild cardiomegaly, mild interstitial lung disease mild right basilar atelectasis or scarring Electronically Signed   By: Claudie Revering M.D.   On: 09/29/2015 09:56   Dg Chest Portable 1 View  09/27/2015  CLINICAL DATA:  Shortness of breath EXAM: PORTABLE CHEST 1 VIEW COMPARISON:  09/24/2015 FINDINGS: There is symmetric and diffuse interstitial coarsening above baseline. Chronic hyperinflation and cardiomegaly. Negative aortic and hilar contours. No effusion or air leak. IMPRESSION: Progressive interstitial coarsening which could be bronchitic or congestive. Electronically Signed   By: Monte Fantasia M.D.   On:  09/27/2015 04:41   Dg Chest Port 1v Same Day  10/02/2015  CLINICAL DATA:  Shortness of breath EXAM: PORTABLE CHEST 1 VIEW COMPARISON:  09/29/15 chest radiograph. FINDINGS: Stable cardiomediastinal silhouette with mild cardiomegaly. No pneumothorax. Probable new trace bilateral pleural effusions. Stable mild pulmonary edema. Stable mild platelike atelectasis at both lung bases. IMPRESSION: 1. Stable mild congestive heart failure. 2. Probable new trace bilateral pleural effusions. 3. Stable mild platelike atelectasis at both lung bases. Electronically Signed   By: Ilona Sorrel M.D.   On: 10/02/2015 10:45    Oren Binet, MD  [facility] Pager:[phone]  If 7PM-7AM, please contact night-coverage [email or web address] Password [email or web address] 10/04/2015, 1:23 PM   LOS: 7 days

## 2015-10-04 NOTE — Care Management Note (Signed)
Case Management Note  Patient Details  Name: Haley Harris MRN: [id number] Date of Birth: [birth year]  Subjective/Objective:   Admitted with Acute Resp Failure, Flu                 Action/Plan: Lives at home with daughter, has a walker and cane at home, no home oxygen at this time. Has private insurance with [facility] with prescription drug coverage; pharmacy of choice [facility]. Patient reports no problem with getting her medication. She is active as much as possible and walks for exercise, Patient is agreeable to the [facility] program for COPD, referral made as requested and [facility] choices offered, patient chose [place]. Mary with [facility] called for referral. Attending MD at discharge please enter the face to face medicare document for [facility] services.  Expected Discharge Date:  Possibly 10/04/2015           Expected Discharge Plan:  [place]  In-House Referral:   [facility]  Discharge planning Services  CM Consult Choice offered to:  Patient  HH Arranged:  PT HH Agency:  [facility]  Status of Service:  In process, will continue to follow  Sherrilyn Rist [id number] 10/04/2015, 10:08 AM

## 2015-10-04 NOTE — Progress Notes (Signed)
Physical Therapy Treatment Patient Details Name: Haley Harris MRN: [id number] DOB: [birth year] Today's Date: 10/04/2015    History of Present Illness 82 year old female with a past medical history significant for CHF and COPD; who presents with three-day history of shortness of breath; Acute respiratory failure with hypoxia secondary to COPD with acute bronchitis from influenza and CAP without sepsis    PT Comments    Patient seen for mobility progression and is making steady progress towards goals. Pt tolerated activity well. Able to ambulate on room air with multiple standing rest breaks. Tolerated in room functional activities and general LE exercises. Continue current POC.  Follow Up Recommendations  Home health PT;Supervision for mobility/OOB     Equipment Recommendations  None recommended by PT    Recommendations for Other Services       Precautions / Restrictions Precautions Precautions: Fall Precaution Comments: watch O2 sats Restrictions Weight Bearing Restrictions: No    Mobility  Bed Mobility Overal bed mobility: Modified Independent             General bed mobility comments: increased time to perform, no physical assist required.   Transfers Overall transfer level: Needs assistance Equipment used: None Transfers: Sit to/from Stand Sit to Stand: Supervision         General transfer comment: no physical assist required  Ambulation/Gait Ambulation/Gait assistance: Supervision Ambulation Distance (Feet): 160 Feet Assistive device: Rolling walker (2 wheeled) Gait Pattern/deviations: Step-through pattern;Decreased stride length;Trunk flexed;Drifts right/left Gait velocity: decreased   General Gait Details: ambulated on room air, no noted DOE at this time. tolerated well. 2 standing rest breaks.     Stairs            Wheelchair Mobility    Modified Rankin (Stroke Patients Only)       Balance     Sitting balance-Leahy Scale: Good      Standing balance support: During functional activity Standing balance-Leahy Scale: Fair                      Cognition Arousal/Alertness: Awake/alert Behavior During Therapy: WFL for tasks assessed/performed Overall Cognitive Status: Within Functional Limits for tasks assessed                      Exercises      General Comments General comments (skin integrity, edema, etc.): general LE ther-ex performed. Educated on LAQs, Ankle pumps, and hip flexion/marching in place. Spoke about energy conservation.      Pertinent Vitals/Pain Pain Assessment: No/denies pain    Home Living                      Prior Function            PT Goals (current goals can now be found in the care plan section) Acute Rehab PT Goals Patient Stated Goal: to go home PT Goal Formulation: With patient Time For Goal Achievement: 10/16/15 Potential to Achieve Goals: Good Progress towards PT goals: Progressing toward goals    Frequency  Min 3X/week    PT Plan Current plan remains appropriate    Co-evaluation             End of Session Equipment Utilized During Treatment: Gait belt;Oxygen Activity Tolerance: Patient limited by fatigue;Patient tolerated treatment well Patient left: in chair;with call bell/phone within reach     Time: 1157-1219 PT Time Calculation (min) (ACUTE ONLY): 22 min  Charges:  $Gait Training: 8-22 mins $Therapeutic  Activity: 8-22 mins                    G CodesDuncan Dull 2015/10/14, 3:07 PM Alben Deeds, [place] DPT  [phone] ]

## 2015-10-05 DIAGNOSIS — J09X2 Influenza due to identified novel influenza A virus with other respiratory manifestations: Secondary | ICD-10-CM

## 2015-10-05 DIAGNOSIS — E279 Disorder of adrenal gland, unspecified: Secondary | ICD-10-CM

## 2015-10-05 LAB — GLUCOSE, CAPILLARY
GLUCOSE-CAPILLARY: 162 mg/dL — AB (ref 65–99)
Glucose-Capillary: 98 mg/dL (ref 65–99)
Glucose-Capillary: 177 mg/dL — ABNORMAL HIGH (ref 65–99)
Glucose-Capillary: 108 mg/dL — ABNORMAL HIGH (ref 65–99)

## 2015-10-05 LAB — BASIC METABOLIC PANEL
ANION GAP: 8 (ref 5–15)
BUN: 35 mg/dL — AB (ref 6–20)
CALCIUM: 7.6 mg/dL — AB (ref 8.9–10.3)
CO2: 29 mmol/L (ref 22–32)
CREATININE: 0.99 mg/dL (ref 0.44–1.00)
Chloride: 90 mmol/L — ABNORMAL LOW (ref 101–111)
GFR calc Af Amer: 59 mL/min — ABNORMAL LOW (ref >60)
GFR, EST NON AFRICAN AMERICAN: 51 mL/min — AB (ref >60)
GLUCOSE: 116 mg/dL — AB (ref 65–99)
Potassium: 4.8 mmol/L (ref 3.5–5.1)
Sodium: 127 mmol/L — ABNORMAL LOW (ref 135–145)

## 2015-10-05 MED ORDER — FUROSEMIDE 10 MG/ML IJ SOLN
40.0000 mg | Freq: Two times a day (BID) | INTRAMUSCULAR | Status: AC
  Administered 2015-10-05 (×2): 40 mg via INTRAVENOUS
  Filled 2015-10-05 (×2): qty 4

## 2015-10-05 NOTE — Progress Notes (Signed)
PATIENT DETAILS Name: Haley Harris Age: 82 y.o. Sex: female Date of Birth: [birth year] Admit Date: 09/27/2015 Admitting Physician Geradine Girt, DO [place]:[id number] Silvio Pate, MD  Subjective: Slowly improving-shortness of breath much better-now off oxygen-however continues to have cough.   Assessment/Plan: Principal Problem: Acute respiratory failure with hypoxia secondary to acute bronchitis: Due to influenza. Slowly improving-continues to have expiratory rhonchi.Continue IV steroids-but taper, completed IV Levaquin on 3/23, nebulized bronchodilators.Continue Flonase/Afrin and mucinex. Encourage incentive spirometry, flutter valve-chest PT to mobilize secretions. Attempt to titrate off O2  Active Problems: Influenza A: Likely causing above, since already improving-and since was more than 5 days from initial symptoms-we will continue to monitor off Tamiflu- continue to provide supportive care  Acute on chronic systolic heart failure (EF by TTE July 2016-40%): Much more compensated-weight is back up to 180-but I think this is not an accurate reflection-we will resume IV Lasix today, and recheck electrolytes in the morning.   Hyponatremia: Likely secondary to acute systolic heart failure. Improving with fluid restriction and Lasix. Follow   History of CAD: Currently stable without any chest pain or shortness of breath. Continue aspirin, Coreg and pravastatin.  Hypertension: Controlled with Coreg, resume losartan in the next few days.  CKD stage III: Creatinine close to usual baseline, follow.  Steroid-induced hyperglycemia: Continue SSI while on steroids. A1c 6.0.  History of right adrenal mass:Chronic issue-stable for continued outpatient monitoring by PCP.  Disposition: Remain inpatient-suspect home on 3/27 clinical improvement continues  Antimicrobial agents  See below  Anti-infectives    Start     Dose/Rate Route Frequency Ordered Stop   10/01/15 0700   levofloxacin (LEVAQUIN) tablet 750 mg  Status:  Discontinued     750 mg Oral Every 48 hours 09/30/15 1312 10/03/15 1116   09/29/15 0700  levofloxacin (LEVAQUIN) IVPB 750 mg  Status:  Discontinued     750 mg 100 mL/hr over 90 Minutes Intravenous Every 48 hours 09/27/15 1045 09/30/15 1312   09/27/15 1045  levofloxacin (LEVAQUIN) IVPB 750 mg  Status:  Discontinued     750 mg 100 mL/hr over 90 Minutes Intravenous Every 24 hours 09/27/15 1033 09/27/15 1045   09/27/15 0700  levofloxacin (LEVAQUIN) IVPB 500 mg     500 mg 100 mL/hr over 60 Minutes Intravenous  Once 09/27/15 0640 09/27/15 0803      DVT Prophylaxis: Prophylactic Lovenox  Code Status: Full code   Family Communication None at bedside  Procedures: None  CONSULTS:  None  Time spent 25 minutes-Greater than 50% of this time was spent in counseling, explanation of diagnosis, planning of further management, and coordination of care.  MEDICATIONS: Scheduled Meds: . aspirin  81 mg Oral QPC lunch  . budesonide  0.25 mg Nebulization BID  . carvedilol  6.25 mg Oral BID WC  . enoxaparin (LOVENOX) injection  40 mg Subcutaneous Q24H  . escitalopram  10 mg Oral Daily  . famotidine  20 mg Oral Daily  . fluticasone  2 spray Each Nare Daily  . furosemide  40 mg Intravenous BID  . gabapentin  100 mg Oral BID  . guaiFENesin  1,200 mg Oral BID  . insulin aspart  0-15 Units Subcutaneous TID WC  . insulin aspart  0-5 Units Subcutaneous QHS  . ipratropium-albuterol  3 mL Nebulization TID  . loratadine  10 mg Oral Daily  . methylPREDNISolone (SOLU-MEDROL) injection  40 mg Intravenous Q12H  . montelukast  10 mg Oral QHS  . multivitamin with minerals  1 tablet Oral QPC lunch  . potassium chloride  20 mEq Oral BID  . pravastatin  80 mg Oral q1800  . sodium chloride flush  3 mL Intravenous Q12H  . spironolactone  12.5 mg Oral Daily   Continuous Infusions:  PRN Meds:.sodium chloride, acetaminophen, benzonatate, docusate sodium,  hydrALAZINE, ipratropium-albuterol, LORazepam, ondansetron (ZOFRAN) IV, oxyCODONE-acetaminophen, sodium chloride flush    PHYSICAL EXAM: Vital signs in last 24 hours: Filed Vitals:   10/05/15 0430 10/05/15 0951 10/05/15 1002 10/05/15 1008  BP: 156/72  145/71   Pulse:    84  Temp:   98.7 F (37.1 C)   TempSrc:   Oral   Resp:      Height:      Weight:      SpO2:  95%      Weight change: 6.078 kg (13 lb 6.4 oz) Filed Weights   10/03/15 0642 10/04/15 0414 10/05/15 0308  Weight: 78.608 kg (173 lb 4.8 oz) 75.66 kg (166 lb 12.8 oz) 81.738 kg (180 lb 3.2 oz)   Body mass index is 29.1 kg/(m^2).   Gen Exam: Awake and alert with clear speech.  Appears comfortableAnd not in any distress Neck: Supple   Chest: Moving air, significantly less rhonchi heard today compared to yesterday.  CVS: S1 S2 Regular, no murmurs.  Abdomen: soft, BS +, non tender, non distended.  Extremities: no edema, lower extremities warm to touch. Neurologic: Non Focal.   Skin: No Rash.   Wounds: N/A.   Intake/Output from previous day:  Intake/Output Summary (Last 24 hours) at 10/05/15 1127 Last data filed at 10/05/15 0600  Gross per 24 hour  Intake    120 ml  Output   1750 ml  Net  -1630 ml     LAB RESULTS: CBC  Recent Labs Lab 10/02/15 0944  WBC 7.8  HGB 11.4*  HCT 32.4*  PLT 175  MCV 94.7  MCH 33.3  MCHC 35.2  RDW 12.2    Chemistries   Recent Labs Lab 10/01/15 0302 10/02/15 0944 10/03/15 0628 10/04/15 0545 10/05/15 0256  NA 126* 124* 126* 127* 127*  K 4.9 4.5 4.9 4.6 4.8  CL 91* 87* 90* 90* 90*  CO2 26 25 28 30 29   GLUCOSE 111* 174* 98 130* 116*  BUN 38* 36* 35* 34* 35*  CREATININE 1.04* 1.12* 0.94 1.19* 0.99  CALCIUM 7.9* 7.7* 7.7* 7.5* 7.6*    CBG:  Recent Labs Lab 10/04/15 0655 10/04/15 1232 10/04/15 1626 10/04/15 2140 10/05/15 0548  GLUCAP 120* 176* 114* 139* 98    GFR Estimated Creatinine Clearance: 45.6 mL/min (by C-G formula based on Cr of  0.99).  Coagulation profile No results for input(s): INR, PROTIME in the last 168 hours.  Cardiac Enzymes No results for input(s): CKMB, TROPONINI, MYOGLOBIN in the last 168 hours.  Invalid input(s): CK  Invalid input(s): POCBNP No results for input(s): DDIMER in the last 72 hours. No results for input(s): HGBA1C in the last 72 hours. No results for input(s): CHOL, HDL, LDLCALC, TRIG, CHOLHDL, LDLDIRECT in the last 72 hours. No results for input(s): TSH, T4TOTAL, T3FREE, THYROIDAB in the last 72 hours.  Invalid input(s): FREET3 No results for input(s): VITAMINB12, FOLATE, FERRITIN, TIBC, IRON, RETICCTPCT in the last 72 hours. No results for input(s): LIPASE, AMYLASE in the last 72 hours.  Urine Studies No results for input(s): UHGB, CRYS in the last 72 hours.  Invalid input(s): UACOL, UAPR, USPG, UPH,  UTP, UGL, UKET, UBIL, UNIT, UROB, ULEU, UEPI, UWBC, URBC, UBAC, CAST, UCOM, BILUA  MICROBIOLOGY: Recent Results (from the past 240 hour(s))  Blood culture (routine x 2)     Status: None   Collection Time: 09/27/15  4:20 AM  Result Value Ref Range Status   Specimen Description BLOOD LEFT ARM  Final   Special Requests BOTTLES DRAWN AEROBIC AND ANAEROBIC 10ML  Final   Culture NO GROWTH 5 DAYS  Final   Report Status 10/02/2015 FINAL  Final  Blood culture (routine x 2)     Status: None   Collection Time: 09/27/15  4:48 AM  Result Value Ref Range Status   Specimen Description BLOOD LEFT WRIST  Final   Special Requests BOTTLES DRAWN AEROBIC ONLY 6ML  Final   Culture NO GROWTH 5 DAYS  Final   Report Status 10/02/2015 FINAL  Final    RADIOLOGY STUDIES/RESULTS: Dg Chest 2 View  09/28/2015  CLINICAL DATA:  Acute respiratory failure with hypoxia EXAM: CHEST  2 VIEW COMPARISON:  Chest radiograph from one day prior. FINDINGS: Stable cardiomediastinal silhouette with mild cardiomegaly. No pneumothorax. No pleural effusion. Mild patchy and curvilinear opacities at the right lung apex and  right lung base, slightly improved. IMPRESSION: Slightly improved curvilinear and patchy opacities at the right lung apex and right lung base, favor bronchitis/bronchopneumonia. Cannot exclude a component of mild pulmonary edema given the cardiomegaly. Electronically Signed   By: Ilona Sorrel M.D.   On: 09/28/2015 10:50   Dg Chest 2 View  09/24/2015  CLINICAL DATA:  Congestion, cough and exertional shortness breath for 3 days. History of CHF. EXAM: CHEST  2 VIEW COMPARISON:  Chest x-rays dated 01/26/2015 and 03/01/2014. FINDINGS: Mild cardiomegaly is stable. Overall cardiomediastinal silhouette is stable in size and configuration. Prominence of the pulmonary arteries suggests chronic pulmonary artery hypertension. Coarse interstitial markings are again seen within each lung, similar to the previous exam of 01/26/2015. No evidence of a superimposed interstitial edema on today's exam, as was seen on earlier chest x-ray of 01/18/2015. No confluent airspace opacity to suggest a developing pneumonia. No pleural effusions seen. Degenerative changes are again noted throughout the kyphotic thoracic spine. Multiple chronic compression fracture deformities again appreciated within the thoracic spine. No acute- appearing osseous abnormality. IMPRESSION: Stable chest x-ray. No evidence of acute cardiopulmonary abnormality. Stable cardiomegaly. Evidence of chronic interstitial lung disease. Evidence of chronic pulmonary artery hypertension. Multiple old compression fracture deformities within thoracic spine. Electronically Signed   By: Franki Cabot M.D.   On: 09/24/2015 19:02   Ct Chest Wo Contrast  09/25/2015  CLINICAL DATA:  Fever and productive cough. Isolated left upper lobe wheezing. Evaluate for pneumonia EXAM: CT CHEST WITHOUT CONTRAST TECHNIQUE: Multidetector CT imaging of the chest was performed following the standard protocol without IV contrast. COMPARISON:  01/19/2015 FINDINGS: THORACIC INLET/BODY WALL: Right  mastectomy. MEDIASTINUM: Chronic cardiomegaly with trace pericardial fluid or thickening. No evidence of acute vascular disease. Diffuse atherosclerosis, including the coronary arteries. No adenopathy. Negative esophagus. LUNG WINDOWS: Mild dependent atelectasis or scar. Trace pleural effusions. Lungs have a stable appearance since 2016. There is no edema, consolidation, effusion, or pneumothorax. UPPER ABDOMEN: Chronic 3 cm right adrenal mass, benign given stability and attributed to adenoma. Benign hepatic low densities which are small. OSSEOUS: Remote compression fractures at T1, T6, T8, and T12. The T6 and T12 fractures have advanced height loss with vertebra plana. No acute osseous finding. IMPRESSION: 1. Negative for pneumonia.  Stable exam compared to July  2016. 2. Chronic basilar atelectasis/ scarring and trace effusions. Electronically Signed   By: Monte Fantasia M.D.   On: 09/25/2015 02:30   Dg Chest Port 1 View  09/29/2015  CLINICAL DATA:  Productive cough for the past 6 days.  Slight fever. EXAM: PORTABLE CHEST 1 VIEW COMPARISON:  Yesterday. FINDINGS: Stable enlarged cardiac silhouette and prominence of the interstitial markings. Small amount of linear density in the right lower lung zone with little change. Otherwise, no airspace opacity. Diffuse osteopenia. Old, healed right humeral neck fracture. IMPRESSION: Stable mild cardiomegaly, mild interstitial lung disease mild right basilar atelectasis or scarring Electronically Signed   By: Claudie Revering M.D.   On: 09/29/2015 09:56   Dg Chest Portable 1 View  09/27/2015  CLINICAL DATA:  Shortness of breath EXAM: PORTABLE CHEST 1 VIEW COMPARISON:  09/24/2015 FINDINGS: There is symmetric and diffuse interstitial coarsening above baseline. Chronic hyperinflation and cardiomegaly. Negative aortic and hilar contours. No effusion or air leak. IMPRESSION: Progressive interstitial coarsening which could be bronchitic or congestive. Electronically Signed   By:  Monte Fantasia M.D.   On: 09/27/2015 04:41   Dg Chest Port 1v Same Day  10/02/2015  CLINICAL DATA:  Shortness of breath EXAM: PORTABLE CHEST 1 VIEW COMPARISON:  09/29/15 chest radiograph. FINDINGS: Stable cardiomediastinal silhouette with mild cardiomegaly. No pneumothorax. Probable new trace bilateral pleural effusions. Stable mild pulmonary edema. Stable mild platelike atelectasis at both lung bases. IMPRESSION: 1. Stable mild congestive heart failure. 2. Probable new trace bilateral pleural effusions. 3. Stable mild platelike atelectasis at both lung bases. Electronically Signed   By: Ilona Sorrel M.D.   On: 10/02/2015 10:45    Oren Binet, MD  [facility] Pager:[phone]  If 7PM-7AM, please contact night-coverage [email or web address] Password [facility] 10/05/2015, 11:27 AM   LOS: 8 days

## 2015-10-06 LAB — GLUCOSE, CAPILLARY
GLUCOSE-CAPILLARY: 133 mg/dL — AB (ref 65–99)
Glucose-Capillary: 106 mg/dL — ABNORMAL HIGH (ref 65–99)
Glucose-Capillary: 139 mg/dL — ABNORMAL HIGH (ref 65–99)
Glucose-Capillary: 148 mg/dL — ABNORMAL HIGH (ref 65–99)

## 2015-10-06 LAB — BASIC METABOLIC PANEL
ANION GAP: 8 (ref 5–15)
BUN: 37 mg/dL — ABNORMAL HIGH (ref 6–20)
CALCIUM: 7.9 mg/dL — AB (ref 8.9–10.3)
CO2: 31 mmol/L (ref 22–32)
Chloride: 88 mmol/L — ABNORMAL LOW (ref 101–111)
Creatinine, Ser: 1.17 mg/dL — ABNORMAL HIGH (ref 0.44–1.00)
GFR, EST AFRICAN AMERICAN: 48 mL/min — AB (ref >60)
GFR, EST NON AFRICAN AMERICAN: 42 mL/min — AB (ref >60)
Glucose, Bld: 119 mg/dL — ABNORMAL HIGH (ref 65–99)
POTASSIUM: 4.6 mmol/L (ref 3.5–5.1)
Sodium: 127 mmol/L — ABNORMAL LOW (ref 135–145)

## 2015-10-06 MED ORDER — PREDNISONE 20 MG PO TABS
40.0000 mg | ORAL_TABLET | Freq: Every day | ORAL | Status: DC
  Administered 2015-10-06 – 2015-10-09 (×4): 40 mg via ORAL
  Filled 2015-10-06 (×5): qty 2

## 2015-10-06 NOTE — Progress Notes (Signed)
Pt is doing flutter valve by herself.

## 2015-10-06 NOTE — Progress Notes (Signed)
PATIENT DETAILS Name: Haley Harris Age: 82 y.o. Sex: female Date of Birth: June 22, 1929 Admit Date: 09/27/2015 Admitting Physician Geradine Girt, DO KH:[id number] Silvio Pate, MD  Subjective: Much improved. Off oxygen-ambulating  with less shortness of breath.   Assessment/Plan: Principal Problem: Acute respiratory failure with hypoxia secondary to acute bronchitis: Due to influenza. Slowly improving-moving air well, hardly any rhonchi heard today. Change from IV Solu-Medrol to prednisone. Continue nebulized bronchodilators and other supportive measures. Completed IV Levaquin on 3/23. Encourage incentive spirometry, flutter valve.  Active Problems: Influenza A: Likely causing above, since already improving-and since was more than 5 days from initial symptoms-we will continue to monitor off Tamiflu- continue to provide supportive care  Acute on chronic systolic heart failure (EF by TTE July 2016-40%): Much more compensated-weight is down by a pound, but now -3.6 L. Creatinine stable, continue IV Lasix for one more day. Suspect could be transitioned to oral Lasix tomorrow morning.   Hyponatremia: Likely secondary to acute systolic heart failure. Stable with fluid restriction and Lasix. Follow   History of CAD: Currently stable without any chest pain or shortness of breath. Continue aspirin, Coreg and pravastatin.  Hypertension: Controlled with Coreg, resume losartan in the next few days.  CKD stage III: Creatinine close to usual baseline, follow.  Steroid-induced hyperglycemia: Continue SSI while on steroids. A1c 6.0.  History of right adrenal mass:Chronic issue-stable for continued outpatient monitoring by PCP.  Disposition: Remain inpatient-suspect home on 3/27 clinical improvement continues  Antimicrobial agents  See below  Anti-infectives    Start     Dose/Rate Route Frequency Ordered Stop   10/01/15 0700  levofloxacin (LEVAQUIN) tablet 750 mg  Status:   Discontinued     750 mg Oral Every 48 hours 09/30/15 1312 10/03/15 1116   09/29/15 0700  levofloxacin (LEVAQUIN) IVPB 750 mg  Status:  Discontinued     750 mg 100 mL/hr over 90 Minutes Intravenous Every 48 hours 09/27/15 1045 09/30/15 1312   09/27/15 1045  levofloxacin (LEVAQUIN) IVPB 750 mg  Status:  Discontinued     750 mg 100 mL/hr over 90 Minutes Intravenous Every 24 hours 09/27/15 1033 09/27/15 1045   09/27/15 0700  levofloxacin (LEVAQUIN) IVPB 500 mg     500 mg 100 mL/hr over 60 Minutes Intravenous  Once 09/27/15 0640 09/27/15 0803      DVT Prophylaxis: Prophylactic Lovenox  Code Status: Full code   Family Communication None at bedside  Procedures: None  CONSULTS:  None  Time spent 25 minutes-Greater than 50% of this time was spent in counseling, explanation of diagnosis, planning of further management, and coordination of care.  MEDICATIONS: Scheduled Meds: . aspirin  81 mg Oral QPC lunch  . budesonide  0.25 mg Nebulization BID  . carvedilol  6.25 mg Oral BID WC  . enoxaparin (LOVENOX) injection  40 mg Subcutaneous Q24H  . escitalopram  10 mg Oral Daily  . famotidine  20 mg Oral Daily  . fluticasone  2 spray Each Nare Daily  . gabapentin  100 mg Oral BID  . guaiFENesin  1,200 mg Oral BID  . insulin aspart  0-15 Units Subcutaneous TID WC  . insulin aspart  0-5 Units Subcutaneous QHS  . ipratropium-albuterol  3 mL Nebulization TID  . loratadine  10 mg Oral Daily  . methylPREDNISolone (SOLU-MEDROL) injection  40 mg Intravenous Q12H  . montelukast  10 mg Oral QHS  . multivitamin  with minerals  1 tablet Oral QPC lunch  . potassium chloride  20 mEq Oral BID  . pravastatin  80 mg Oral q1800  . sodium chloride flush  3 mL Intravenous Q12H  . spironolactone  12.5 mg Oral Daily   Continuous Infusions:  PRN Meds:.sodium chloride, acetaminophen, benzonatate, docusate sodium, hydrALAZINE, ipratropium-albuterol, LORazepam, ondansetron (ZOFRAN) IV,  oxyCODONE-acetaminophen, sodium chloride flush    PHYSICAL EXAM: Vital signs in last 24 hours: Filed Vitals:   10/05/15 2045 10/06/15 0018 10/06/15 0520 10/06/15 0758  BP: 124/78 153/84 165/73   Pulse: 89 79 67   Temp: 98.6 F (37 C) 98.3 F (36.8 C) 97.6 F (36.4 C)   TempSrc: Oral Oral Oral   Resp: 16 16 16    Height:      Weight:   81.239 kg (179 lb 1.6 oz)   SpO2: 97% 98% 99% 98%    Weight change: -0.499 kg (-1 lb 1.6 oz) Filed Weights   10/04/15 0414 10/05/15 0308 10/06/15 0520  Weight: 75.66 kg (166 lb 12.8 oz) 81.738 kg (180 lb 3.2 oz) 81.239 kg (179 lb 1.6 oz)   Body mass index is 28.92 kg/(m^2).   Gen Exam: Awake and alert with clear speech.  Appears comfortable-not in any distress Neck: Supple   Chest: Moving air bilaterally-hardly any rhonchi today CVS: S1 S2 Regular, no murmurs.  Abdomen: soft, BS +, non tender, non distended.  Extremities: no edema, lower extremities warm to touch. Neurologic: Non Focal.   Skin: No Rash.   Wounds: N/A.   Intake/Output from previous day:  Intake/Output Summary (Last 24 hours) at 10/06/15 1116 Last data filed at 10/06/15 0930  Gross per 24 hour  Intake   1080 ml  Output   2301 ml  Net  -1221 ml     LAB RESULTS: CBC  Recent Labs Lab 10/02/15 0944  WBC 7.8  HGB 11.4*  HCT 32.4*  PLT 175  MCV 94.7  MCH 33.3  MCHC 35.2  RDW 12.2    Chemistries   Recent Labs Lab 10/02/15 0944 10/03/15 0628 10/04/15 0545 10/05/15 0256 10/06/15 0305  NA 124* 126* 127* 127* 127*  K 4.5 4.9 4.6 4.8 4.6  CL 87* 90* 90* 90* 88*  CO2 25 28 30 29 31   GLUCOSE 174* 98 130* 116* 119*  BUN 36* 35* 34* 35* 37*  CREATININE 1.12* 0.94 1.19* 0.99 1.17*  CALCIUM 7.7* 7.7* 7.5* 7.6* 7.9*    CBG:  Recent Labs Lab 10/05/15 0548 10/05/15 1122 10/05/15 1635 10/05/15 2221 10/06/15 0622  GLUCAP 98 177* 108* 162* 106*    GFR Estimated Creatinine Clearance: 38.5 mL/min (by C-G formula based on Cr of 1.17).  Coagulation  profile No results for input(s): INR, PROTIME in the last 168 hours.  Cardiac Enzymes No results for input(s): CKMB, TROPONINI, MYOGLOBIN in the last 168 hours.  Invalid input(s): CK  Invalid input(s): POCBNP No results for input(s): DDIMER in the last 72 hours. No results for input(s): HGBA1C in the last 72 hours. No results for input(s): CHOL, HDL, LDLCALC, TRIG, CHOLHDL, LDLDIRECT in the last 72 hours. No results for input(s): TSH, T4TOTAL, T3FREE, THYROIDAB in the last 72 hours.  Invalid input(s): FREET3 No results for input(s): VITAMINB12, FOLATE, FERRITIN, TIBC, IRON, RETICCTPCT in the last 72 hours. No results for input(s): LIPASE, AMYLASE in the last 72 hours.  Urine Studies No results for input(s): UHGB, CRYS in the last 72 hours.  Invalid input(s): UACOL, UAPR, USPG, UPH, UTP, UGL, UKET,  UBIL, UNIT, UROB, ULEU, UEPI, UWBC, URBC, UBAC, CAST, UCOM, BILUA  MICROBIOLOGY: Recent Results (from the past 240 hour(s))  Blood culture (routine x 2)     Status: None   Collection Time: 09/27/15  4:20 AM  Result Value Ref Range Status   Specimen Description BLOOD LEFT ARM  Final   Special Requests BOTTLES DRAWN AEROBIC AND ANAEROBIC 10ML  Final   Culture NO GROWTH 5 DAYS  Final   Report Status 10/02/2015 FINAL  Final  Blood culture (routine x 2)     Status: None   Collection Time: 09/27/15  4:48 AM  Result Value Ref Range Status   Specimen Description BLOOD LEFT WRIST  Final   Special Requests BOTTLES DRAWN AEROBIC ONLY 6ML  Final   Culture NO GROWTH 5 DAYS  Final   Report Status 10/02/2015 FINAL  Final    RADIOLOGY STUDIES/RESULTS: Dg Chest 2 View  09/28/2015  CLINICAL DATA:  Acute respiratory failure with hypoxia EXAM: CHEST  2 VIEW COMPARISON:  Chest radiograph from one day prior. FINDINGS: Stable cardiomediastinal silhouette with mild cardiomegaly. No pneumothorax. No pleural effusion. Mild patchy and curvilinear opacities at the right lung apex and right lung base,  slightly improved. IMPRESSION: Slightly improved curvilinear and patchy opacities at the right lung apex and right lung base, favor bronchitis/bronchopneumonia. Cannot exclude a component of mild pulmonary edema given the cardiomegaly. Electronically Signed   By: Ilona Sorrel M.D.   On: 09/28/2015 10:50   Dg Chest 2 View  09/24/2015  CLINICAL DATA:  Congestion, cough and exertional shortness breath for 3 days. History of CHF. EXAM: CHEST  2 VIEW COMPARISON:  Chest x-rays dated 01/26/2015 and 03/01/2014. FINDINGS: Mild cardiomegaly is stable. Overall cardiomediastinal silhouette is stable in size and configuration. Prominence of the pulmonary arteries suggests chronic pulmonary artery hypertension. Coarse interstitial markings are again seen within each lung, similar to the previous exam of 01/26/2015. No evidence of a superimposed interstitial edema on today's exam, as was seen on earlier chest x-ray of 01/18/2015. No confluent airspace opacity to suggest a developing pneumonia. No pleural effusions seen. Degenerative changes are again noted throughout the kyphotic thoracic spine. Multiple chronic compression fracture deformities again appreciated within the thoracic spine. No acute- appearing osseous abnormality. IMPRESSION: Stable chest x-ray. No evidence of acute cardiopulmonary abnormality. Stable cardiomegaly. Evidence of chronic interstitial lung disease. Evidence of chronic pulmonary artery hypertension. Multiple old compression fracture deformities within thoracic spine. Electronically Signed   By: Franki Cabot M.D.   On: 09/24/2015 19:02   Ct Chest Wo Contrast  09/25/2015  CLINICAL DATA:  Fever and productive cough. Isolated left upper lobe wheezing. Evaluate for pneumonia EXAM: CT CHEST WITHOUT CONTRAST TECHNIQUE: Multidetector CT imaging of the chest was performed following the standard protocol without IV contrast. COMPARISON:  01/19/2015 FINDINGS: THORACIC INLET/BODY WALL: Right mastectomy.  MEDIASTINUM: Chronic cardiomegaly with trace pericardial fluid or thickening. No evidence of acute vascular disease. Diffuse atherosclerosis, including the coronary arteries. No adenopathy. Negative esophagus. LUNG WINDOWS: Mild dependent atelectasis or scar. Trace pleural effusions. Lungs have a stable appearance since 2016. There is no edema, consolidation, effusion, or pneumothorax. UPPER ABDOMEN: Chronic 3 cm right adrenal mass, benign given stability and attributed to adenoma. Benign hepatic low densities which are small. OSSEOUS: Remote compression fractures at T1, T6, T8, and T12. The T6 and T12 fractures have advanced height loss with vertebra plana. No acute osseous finding. IMPRESSION: 1. Negative for pneumonia.  Stable exam compared to July 2016. 2. Chronic  basilar atelectasis/ scarring and trace effusions. Electronically Signed   By: Monte Fantasia M.D.   On: 09/25/2015 02:30   Dg Chest Port 1 View  09/29/2015  CLINICAL DATA:  Productive cough for the past 6 days.  Slight fever. EXAM: PORTABLE CHEST 1 VIEW COMPARISON:  Yesterday. FINDINGS: Stable enlarged cardiac silhouette and prominence of the interstitial markings. Small amount of linear density in the right lower lung zone with little change. Otherwise, no airspace opacity. Diffuse osteopenia. Old, healed right humeral neck fracture. IMPRESSION: Stable mild cardiomegaly, mild interstitial lung disease mild right basilar atelectasis or scarring Electronically Signed   By: Claudie Revering M.D.   On: 09/29/2015 09:56   Dg Chest Portable 1 View  09/27/2015  CLINICAL DATA:  Shortness of breath EXAM: PORTABLE CHEST 1 VIEW COMPARISON:  09/24/2015 FINDINGS: There is symmetric and diffuse interstitial coarsening above baseline. Chronic hyperinflation and cardiomegaly. Negative aortic and hilar contours. No effusion or air leak. IMPRESSION: Progressive interstitial coarsening which could be bronchitic or congestive. Electronically Signed   By: Monte Fantasia M.D.   On: 09/27/2015 04:41   Dg Chest Port 1v Same Day  10/02/2015  CLINICAL DATA:  Shortness of breath EXAM: PORTABLE CHEST 1 VIEW COMPARISON:  09/29/15 chest radiograph. FINDINGS: Stable cardiomediastinal silhouette with mild cardiomegaly. No pneumothorax. Probable new trace bilateral pleural effusions. Stable mild pulmonary edema. Stable mild platelike atelectasis at both lung bases. IMPRESSION: 1. Stable mild congestive heart failure. 2. Probable new trace bilateral pleural effusions. 3. Stable mild platelike atelectasis at both lung bases. Electronically Signed   By: Ilona Sorrel M.D.   On: 10/02/2015 10:45    Oren Binet, MD  [facility] Pager:[phone]  If 7PM-7AM, please contact night-coverage [email or web address] Password [email or web address] 10/06/2015, 11:16 AM   LOS: 9 days

## 2015-10-07 LAB — BASIC METABOLIC PANEL
Anion gap: 7 (ref 5–15)
BUN: 35 mg/dL — AB (ref 6–20)
CHLORIDE: 92 mmol/L — AB (ref 101–111)
CO2: 29 mmol/L (ref 22–32)
CREATININE: 1.04 mg/dL — AB (ref 0.44–1.00)
Calcium: 8 mg/dL — ABNORMAL LOW (ref 8.9–10.3)
GFR calc Af Amer: 56 mL/min — ABNORMAL LOW (ref >60)
GFR calc non Af Amer: 48 mL/min — ABNORMAL LOW (ref >60)
GLUCOSE: 112 mg/dL — AB (ref 65–99)
Potassium: 4.6 mmol/L (ref 3.5–5.1)
Sodium: 128 mmol/L — ABNORMAL LOW (ref 135–145)

## 2015-10-07 LAB — GLUCOSE, CAPILLARY
Glucose-Capillary: 92 mg/dL (ref 65–99)
Glucose-Capillary: 126 mg/dL — ABNORMAL HIGH (ref 65–99)
Glucose-Capillary: 128 mg/dL — ABNORMAL HIGH (ref 65–99)
Glucose-Capillary: 108 mg/dL — ABNORMAL HIGH (ref 65–99)

## 2015-10-07 MED ORDER — FUROSEMIDE 10 MG/ML IJ SOLN
40.0000 mg | Freq: Three times a day (TID) | INTRAMUSCULAR | Status: DC
  Administered 2015-10-07 – 2015-10-09 (×7): 40 mg via INTRAVENOUS
  Filled 2015-10-07 (×7): qty 4

## 2015-10-07 NOTE — Progress Notes (Signed)
Patient able to do flutter valve by herself without assistance.

## 2015-10-07 NOTE — Progress Notes (Signed)
PATIENT DETAILS Name: Haley Harris Age: 82 y.o. Sex: female Date of Birth: Jul 17, 1928 Admit Date: 09/27/2015 Admitting Physician Geradine Girt, DO KH:[id number] Silvio Pate, MD  Subjective: Breathing better-but as now put on more weight.  Assessment/Plan: Principal Problem: Acute respiratory failure with hypoxia secondary to acute bronchitis: Due to influenza. Much mproved-moving air well, hardly any rhonchi heard today. Now transitioned from Solu-Medrol to prednisone. Continue nebulized bronchodilators and other supportive measures. Completed IV Levaquin on 3/23. Encourage incentive spirometry, flutter valve.  Active Problems: Influenza A: Likely causing above, since already improving-and since was more than 5 days from initial symptoms-we will continue to monitor off Tamiflu- continue to provide supportive care  Acute on chronic systolic heart failure (EF by TTE July 2016-40%): Unfortunately still way above his dry weight-change IV Lasix to TID, follow.   Hyponatremia: Likely secondary to acute systolic heart failure. Slowly improving with  fluid restriction and Lasix. Follow   History of CAD: Currently stable without any chest pain or shortness of breath. Continue aspirin, Coreg and pravastatin.  Hypertension: Controlled with Coreg, resume losartan in the next few days.  CKD stage III: Creatinine close to usual baseline, follow.  Steroid-induced hyperglycemia: Continue SSI while on steroids. A1c 6.0.  History of right adrenal mass:Chronic issue-stable for continued outpatient monitoring by PCP.  Disposition: Remain inpatient-suspect home in 2 days if clinical improvement continues  Antimicrobial agents  See below  Anti-infectives    Start     Dose/Rate Route Frequency Ordered Stop   10/01/15 0700  levofloxacin (LEVAQUIN) tablet 750 mg  Status:  Discontinued     750 mg Oral Every 48 hours 09/30/15 1312 10/03/15 1116   09/29/15 0700  levofloxacin (LEVAQUIN)  IVPB 750 mg  Status:  Discontinued     750 mg 100 mL/hr over 90 Minutes Intravenous Every 48 hours 09/27/15 1045 09/30/15 1312   09/27/15 1045  levofloxacin (LEVAQUIN) IVPB 750 mg  Status:  Discontinued     750 mg 100 mL/hr over 90 Minutes Intravenous Every 24 hours 09/27/15 1033 09/27/15 1045   09/27/15 0700  levofloxacin (LEVAQUIN) IVPB 500 mg     500 mg 100 mL/hr over 60 Minutes Intravenous  Once 09/27/15 0640 09/27/15 0803      DVT Prophylaxis: Prophylactic Lovenox  Code Status: Full code   Family Communication None at bedside  Procedures: None  CONSULTS:  None  Time spent 25 minutes-Greater than 50% of this time was spent in counseling, explanation of diagnosis, planning of further management, and coordination of care.  MEDICATIONS: Scheduled Meds: . aspirin  81 mg Oral QPC lunch  . budesonide  0.25 mg Nebulization BID  . carvedilol  6.25 mg Oral BID WC  . enoxaparin (LOVENOX) injection  40 mg Subcutaneous Q24H  . escitalopram  10 mg Oral Daily  . famotidine  20 mg Oral Daily  . fluticasone  2 spray Each Nare Daily  . furosemide  40 mg Intravenous TID  . gabapentin  100 mg Oral BID  . guaiFENesin  1,200 mg Oral BID  . insulin aspart  0-15 Units Subcutaneous TID WC  . insulin aspart  0-5 Units Subcutaneous QHS  . ipratropium-albuterol  3 mL Nebulization TID  . loratadine  10 mg Oral Daily  . montelukast  10 mg Oral QHS  . multivitamin with minerals  1 tablet Oral QPC lunch  . potassium chloride  20 mEq Oral BID  .  pravastatin  80 mg Oral q1800  . predniSONE  40 mg Oral Q breakfast  . sodium chloride flush  3 mL Intravenous Q12H  . spironolactone  12.5 mg Oral Daily   Continuous Infusions:  PRN Meds:.sodium chloride, acetaminophen, benzonatate, docusate sodium, hydrALAZINE, ipratropium-albuterol, LORazepam, ondansetron (ZOFRAN) IV, oxyCODONE-acetaminophen, sodium chloride flush    PHYSICAL EXAM: Vital signs in last 24 hours: Filed Vitals:   10/06/15  2058 10/07/15 0639 10/07/15 0832 10/07/15 1011  BP: 140/78 159/71 146/72   Pulse: 90 68 89   Temp: 98.3 F (36.8 C) 98.3 F (36.8 C)    TempSrc: Oral Oral    Resp: 17 16    Height:      Weight:  81.647 kg (180 lb)    SpO2: 97% 99%  96%    Weight change: 0.408 kg (14.4 oz) Filed Weights   10/05/15 0308 10/06/15 0520 10/07/15 0639  Weight: 81.738 kg (180 lb 3.2 oz) 81.239 kg (179 lb 1.6 oz) 81.647 kg (180 lb)   Body mass index is 29.07 kg/(m^2).   Gen Exam: Awake and alert with clear speech.  Appears comfortable-not in any distress Neck: Supple   Chest: Moving air bilaterally-No rhonchi today CVS: S1 S2 Regular, no murmurs.  Abdomen: soft, BS +, non tender, non distended.  Extremities: no edema, lower extremities warm to touch. Neurologic: Non Focal.   Skin: No Rash.   Wounds: N/A.   Intake/Output from previous day:  Intake/Output Summary (Last 24 hours) at 10/07/15 1139 Last data filed at 10/07/15 0859  Gross per 24 hour  Intake   1080 ml  Output   1101 ml  Net    -21 ml     LAB RESULTS: CBC  Recent Labs Lab 10/02/15 0944  WBC 7.8  HGB 11.4*  HCT 32.4*  PLT 175  MCV 94.7  MCH 33.3  MCHC 35.2  RDW 12.2    Chemistries   Recent Labs Lab 10/03/15 0628 10/04/15 0545 10/05/15 0256 10/06/15 0305 10/07/15 0400  NA 126* 127* 127* 127* 128*  K 4.9 4.6 4.8 4.6 4.6  CL 90* 90* 90* 88* 92*  CO2 28 30 29 31 29   GLUCOSE 98 130* 116* 119* 112*  BUN 35* 34* 35* 37* 35*  CREATININE 0.94 1.19* 0.99 1.17* 1.04*  CALCIUM 7.7* 7.5* 7.6* 7.9* 8.0*    CBG:  Recent Labs Lab 10/06/15 1249 10/06/15 1623 10/06/15 2203 10/07/15 0647 10/07/15 1050  GLUCAP 133* 139* 148* 92 126*    GFR Estimated Creatinine Clearance: 43.4 mL/min (by C-G formula based on Cr of 1.04).  Coagulation profile No results for input(s): INR, PROTIME in the last 168 hours.  Cardiac Enzymes No results for input(s): CKMB, TROPONINI, MYOGLOBIN in the last 168 hours.  Invalid  input(s): CK  Invalid input(s): POCBNP No results for input(s): DDIMER in the last 72 hours. No results for input(s): HGBA1C in the last 72 hours. No results for input(s): CHOL, HDL, LDLCALC, TRIG, CHOLHDL, LDLDIRECT in the last 72 hours. No results for input(s): TSH, T4TOTAL, T3FREE, THYROIDAB in the last 72 hours.  Invalid input(s): FREET3 No results for input(s): VITAMINB12, FOLATE, FERRITIN, TIBC, IRON, RETICCTPCT in the last 72 hours. No results for input(s): LIPASE, AMYLASE in the last 72 hours.  Urine Studies No results for input(s): UHGB, CRYS in the last 72 hours.  Invalid input(s): UACOL, UAPR, USPG, UPH, UTP, UGL, UKET, UBIL, UNIT, UROB, ULEU, UEPI, UWBC, URBC, UBAC, CAST, UCOM, BILUA  MICROBIOLOGY: No results found for this  or any previous visit (from the past 240 hour(s)).  RADIOLOGY STUDIES/RESULTS: Dg Chest 2 View  09/28/2015  CLINICAL DATA:  Acute respiratory failure with hypoxia EXAM: CHEST  2 VIEW COMPARISON:  Chest radiograph from one day prior. FINDINGS: Stable cardiomediastinal silhouette with mild cardiomegaly. No pneumothorax. No pleural effusion. Mild patchy and curvilinear opacities at the right lung apex and right lung base, slightly improved. IMPRESSION: Slightly improved curvilinear and patchy opacities at the right lung apex and right lung base, favor bronchitis/bronchopneumonia. Cannot exclude a component of mild pulmonary edema given the cardiomegaly. Electronically Signed   By: Ilona Sorrel M.D.   On: 09/28/2015 10:50   Dg Chest 2 View  09/24/2015  CLINICAL DATA:  Congestion, cough and exertional shortness breath for 3 days. History of CHF. EXAM: CHEST  2 VIEW COMPARISON:  Chest x-rays dated 01/26/2015 and 03/01/2014. FINDINGS: Mild cardiomegaly is stable. Overall cardiomediastinal silhouette is stable in size and configuration. Prominence of the pulmonary arteries suggests chronic pulmonary artery hypertension. Coarse interstitial markings are again seen  within each lung, similar to the previous exam of 01/26/2015. No evidence of a superimposed interstitial edema on today's exam, as was seen on earlier chest x-ray of 01/18/2015. No confluent airspace opacity to suggest a developing pneumonia. No pleural effusions seen. Degenerative changes are again noted throughout the kyphotic thoracic spine. Multiple chronic compression fracture deformities again appreciated within the thoracic spine. No acute- appearing osseous abnormality. IMPRESSION: Stable chest x-ray. No evidence of acute cardiopulmonary abnormality. Stable cardiomegaly. Evidence of chronic interstitial lung disease. Evidence of chronic pulmonary artery hypertension. Multiple old compression fracture deformities within thoracic spine. Electronically Signed   By: Franki Cabot M.D.   On: 09/24/2015 19:02   Ct Chest Wo Contrast  09/25/2015  CLINICAL DATA:  Fever and productive cough. Isolated left upper lobe wheezing. Evaluate for pneumonia EXAM: CT CHEST WITHOUT CONTRAST TECHNIQUE: Multidetector CT imaging of the chest was performed following the standard protocol without IV contrast. COMPARISON:  01/19/2015 FINDINGS: THORACIC INLET/BODY WALL: Right mastectomy. MEDIASTINUM: Chronic cardiomegaly with trace pericardial fluid or thickening. No evidence of acute vascular disease. Diffuse atherosclerosis, including the coronary arteries. No adenopathy. Negative esophagus. LUNG WINDOWS: Mild dependent atelectasis or scar. Trace pleural effusions. Lungs have a stable appearance since 2016. There is no edema, consolidation, effusion, or pneumothorax. UPPER ABDOMEN: Chronic 3 cm right adrenal mass, benign given stability and attributed to adenoma. Benign hepatic low densities which are small. OSSEOUS: Remote compression fractures at T1, T6, T8, and T12. The T6 and T12 fractures have advanced height loss with vertebra plana. No acute osseous finding. IMPRESSION: 1. Negative for pneumonia.  Stable exam compared to  July 2016. 2. Chronic basilar atelectasis/ scarring and trace effusions. Electronically Signed   By: Monte Fantasia M.D.   On: 09/25/2015 02:30   Dg Chest Port 1 View  09/29/2015  CLINICAL DATA:  Productive cough for the past 6 days.  Slight fever. EXAM: PORTABLE CHEST 1 VIEW COMPARISON:  Yesterday. FINDINGS: Stable enlarged cardiac silhouette and prominence of the interstitial markings. Small amount of linear density in the right lower lung zone with little change. Otherwise, no airspace opacity. Diffuse osteopenia. Old, healed right humeral neck fracture. IMPRESSION: Stable mild cardiomegaly, mild interstitial lung disease mild right basilar atelectasis or scarring Electronically Signed   By: Claudie Revering M.D.   On: 09/29/2015 09:56   Dg Chest Portable 1 View  09/27/2015  CLINICAL DATA:  Shortness of breath EXAM: PORTABLE CHEST 1 VIEW COMPARISON:  09/24/2015  FINDINGS: There is symmetric and diffuse interstitial coarsening above baseline. Chronic hyperinflation and cardiomegaly. Negative aortic and hilar contours. No effusion or air leak. IMPRESSION: Progressive interstitial coarsening which could be bronchitic or congestive. Electronically Signed   By: Monte Fantasia M.D.   On: 09/27/2015 04:41   Dg Chest Port 1v Same Day  10/02/2015  CLINICAL DATA:  Shortness of breath EXAM: PORTABLE CHEST 1 VIEW COMPARISON:  09/29/15 chest radiograph. FINDINGS: Stable cardiomediastinal silhouette with mild cardiomegaly. No pneumothorax. Probable new trace bilateral pleural effusions. Stable mild pulmonary edema. Stable mild platelike atelectasis at both lung bases. IMPRESSION: 1. Stable mild congestive heart failure. 2. Probable new trace bilateral pleural effusions. 3. Stable mild platelike atelectasis at both lung bases. Electronically Signed   By: Ilona Sorrel M.D.   On: 10/02/2015 10:45    Oren Binet, MD  [facility] Pager:[phone]  If 7PM-7AM, please contact  night-coverage [email or web address] Password [facility] 10/07/2015, 11:39 AM   LOS: 10 days

## 2015-10-07 NOTE — Clinical Documentation Improvement (Addendum)
Hospitalist  (Please document query responses in the current medical record, not on the CDI BPA form.  Thank you.)  Please clarify and document if the diagnosis of Pneumonia has been:  - Confirmed and is applicable to this admission  - Continues to be suspected, probable or likely during this admission  - Ruled out and is not applicable to this admission  - Unable to clinically determine  Clinical Information/Indicators: "Influenza PCR POSITIVE for A- pt's initial sx's began on 3/11 so out of window for Tamiflu- continue empiric Levaquin to cover for secondary pneumonia" documented by Erin Hearing, NP 09/27/15 at 12:30 pm "Continued Levaquin for suspected infectious component-of note no focal infiltrate on chest x-ray" documented in progress note 09/28/15.  "Continue IV steroids, IV Levaquin-stop date 3/23, nebulized bronchodilators." documented in progress note 10/03/15   Please exercise your independent, professional judgment when responding. A specific answer is not anticipated or expected.   Thank You, Erling Conte  RN BSN CCDS [phone] Health Information Management [place]

## 2015-10-07 NOTE — Progress Notes (Signed)
Physical Therapy Treatment Patient Details Name: Haley Harris MRN: [id number] DOB: 25-Jul-1928 Today's Date: 10/07/2015    History of Present Illness 82 year old female with a past medical history significant for CHF and COPD; who presents with three-day history of shortness of breath; Acute respiratory failure with hypoxia secondary to COPD with acute bronchitis from influenza and CAP without sepsis    PT Comments    Patient demonstrates steady progress towards PT goals. Patient mobilizing well despite modest increase in fatigue today. Patient ambulated increased distance this morning. Will continue to see and progress as tolerated. Would like to re-attempt stair negotiation next session.   Follow Up Recommendations  Home health PT;Supervision for mobility/OOB     Equipment Recommendations  None recommended by PT    Recommendations for Other Services       Precautions / Restrictions Precautions Precautions: Fall Restrictions Weight Bearing Restrictions: No    Mobility  Bed Mobility Overal bed mobility: Modified Independent             General bed mobility comments: increased time to perform, no physical assist required.   Transfers Overall transfer level: Needs assistance Equipment used: None   Sit to Stand: Supervision         General transfer comment: no physical assist required  Ambulation/Gait Ambulation/Gait assistance: Supervision Ambulation Distance (Feet): 180 Feet Assistive device: Rolling walker (2 wheeled) Gait Pattern/deviations: Step-through pattern;Decreased stride length;Trunk flexed;Drifts right/left Gait velocity: decreased   General Gait Details: Ambulated on room air, despite fatigue. patient with 3 standing rest breaks (brief). Saturations remained stable on room air.  Cues for pursed lip breathing.   Stairs            Wheelchair Mobility    Modified Rankin (Stroke Patients Only)       Balance   Sitting-balance support:  No upper extremity supported Sitting balance-Leahy Scale: Good     Standing balance support: During functional activity Standing balance-Leahy Scale: Fair                      Cognition Arousal/Alertness: Awake/alert Behavior During Therapy: WFL for tasks assessed/performed Overall Cognitive Status: Within Functional Limits for tasks assessed                      Exercises      General Comments General comments (skin integrity, edema, etc.): Spoke with patient regarding review of energy conservation. patient with modest DOE after activity, 2 minutes to return to baseline.      Pertinent Vitals/Pain Pain Assessment: No/denies pain    Home Living                      Prior Function            PT Goals (current goals can now be found in the care plan section) Acute Rehab PT Goals Patient Stated Goal: to go home PT Goal Formulation: With patient Time For Goal Achievement: 10/16/15 Potential to Achieve Goals: Good Progress towards PT goals: Progressing toward goals    Frequency  Min 3X/week    PT Plan Current plan remains appropriate    Co-evaluation             End of Session Equipment Utilized During Treatment: Gait belt Activity Tolerance: Patient limited by fatigue;Patient tolerated treatment well Patient left: in chair;with call bell/phone within reach     Time: [id number] PT Time Calculation (min) (ACUTE ONLY): 26 min  Charges:  $  Gait Training: 8-22 mins $Therapeutic Activity: 8-22 mins                    G CodesDuncan Dull 10/27/15, 10:47 AM Alben Deeds, PT DPT  [phone]

## 2015-10-08 LAB — BASIC METABOLIC PANEL
ANION GAP: 8 (ref 5–15)
BUN: 37 mg/dL — AB (ref 6–20)
CALCIUM: 7.9 mg/dL — AB (ref 8.9–10.3)
CO2: 32 mmol/L (ref 22–32)
Chloride: 91 mmol/L — ABNORMAL LOW (ref 101–111)
Creatinine, Ser: 1.16 mg/dL — ABNORMAL HIGH (ref 0.44–1.00)
GFR calc Af Amer: 49 mL/min — ABNORMAL LOW (ref >60)
GFR, EST NON AFRICAN AMERICAN: 42 mL/min — AB (ref >60)
GLUCOSE: 97 mg/dL (ref 65–99)
Potassium: 3.9 mmol/L (ref 3.5–5.1)
Sodium: 131 mmol/L — ABNORMAL LOW (ref 135–145)

## 2015-10-08 LAB — GLUCOSE, CAPILLARY
GLUCOSE-CAPILLARY: 81 mg/dL (ref 65–99)
GLUCOSE-CAPILLARY: 143 mg/dL — AB (ref 65–99)
GLUCOSE-CAPILLARY: 129 mg/dL — AB (ref 65–99)

## 2015-10-08 MED ORDER — IPRATROPIUM-ALBUTEROL 0.5-2.5 (3) MG/3ML IN SOLN
3.0000 mL | Freq: Two times a day (BID) | RESPIRATORY_TRACT | Status: DC
  Administered 2015-10-09: 3 mL via RESPIRATORY_TRACT
  Filled 2015-10-08: qty 3

## 2015-10-08 NOTE — Consult Note (Signed)
Reason for Consult:Decompensated congestive heart failure Referring Physician: [facility]  Haley Harris is an 82 y.o. female.  HPI: Patient is a 82 year old female with past medical history significant for multiple medical problems, i.e., coronary artery disease, history of non-Q-wave myocardial infarction 2, first in November 2012, requiring PTCA stenting to distal RCA and then August 2015 requiring PTCA stenting to LAD,status post left cardiac catheterization in July 2016.  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  Patient denies any chest pains.  Cardiologic consultation is called because of family's request and progressive weight gain  Due to steroids.  Patient states breathing has improved now received extra dose of IV Lasix yesterday.  Denies any fever or chills.  Denies any excessive salt intake or noncompliance with medication.  Past Medical History  Diagnosis Date  . Allergic rhinitis, cause unspecified   . Unspecified cardiovascular disease   . Personal history of malignant neoplasm of breast   . Occlusion and stenosis of carotid artery without mention of cerebral infarction   . Chronic airway obstruction, not elsewhere classified   . Depressive disorder, not elsewhere classified   . Other dyspnea and respiratory abnormality   . Esophageal reflux   . Unspecified hearing loss   . Other and unspecified hyperlipidemia   . Unspecified essential hypertension   . Osteoporosis, unspecified   . Peripheral vascular disease,  unspecified ([place])   . Unspecified urinary incontinence   . Spinal stenosis   . ACE-inhibitor cough   . Angina   . Heart murmur     aS CHILD  . Shortness of breath   . Recurrent upper respiratory infection (URI)   . Anxiety   . Pneumonia   . 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 Myocardial infarct ([place])   . Osteoarthrosis, unspecified whether generalized or localized, unspecified site   . Malignant neoplasm of breast (female), unspecified site   . Compression fracture of T12 vertebra ([place]) 10/28/2011  . CHF (congestive heart failure) [facility])     Past Surgical History  Procedure Laterality Date  . Mastectomy  1987    Right  . Breast reconstruction  1998    Reconstruction   . Reduction mammaplasty  1998    Left  . Mastoidectomy  childhood  . Appendectomy  1948  . Lumbar laminectomy  2003  . Shoulder surgery  02/2005    Bilateral fractures with multiple surgeries  . Breast implant removal  06/12/09    right  . Abdominal hysterectomy    . Fracture surgery      fracture right elbow  . Coronary angioplasty  11/12    distal RCA  . Mastectomy    . Left heart catheterization with coronary angiogram N/A 06/05/2011    Procedure: LEFT HEART CATHETERIZATION WITH CORONARY ANGIOGRAM;  Surgeon: Clent Demark, MD;  Location: [facility] CATH LAB;  Service: Cardiovascular;  Laterality: N/A;  . Left heart catheterization with coronary angiogram N/A 03/05/2014    Procedure: LEFT HEART CATHETERIZATION WITH CORONARY ANGIOGRAM;  Surgeon: Clent Demark, MD;  Location: [facility] CATH LAB;  Service: Cardiovascular;  Laterality: N/A;  . Flexible sigmoidoscopy  N/A 01/21/2015    Procedure: FLEXIBLE SIGMOIDOSCOPY;  Surgeon: Richmond Campbell, MD;  Location: [facility] ENDOSCOPY;  Service: Endoscopy;  Laterality: N/A;  . Flexible sigmoidoscopy  01/22/2015       . Cardiac catheterization N/A 01/25/2015    Procedure: Left Heart Cath and Coronary Angiography;  Surgeon: Charolette Forward, MD;  Location: [place]  CV LAB;  Service: Cardiovascular;  Laterality: N/A;    Family History  Problem Relation Age of Onset  . Heart attack Father 42  . Cancer Mother     ovarian  . Cancer Brother     lung  . Arthritis      family    Social History:  reports that she quit smoking about 41 years ago. She has never used smokeless tobacco. She reports that she drinks alcohol. She reports that she does not use illicit drugs.  Allergies:  Allergies  Allergen Reactions  . Amoxicillin-Pot Clavulanate Anaphylaxis  . Cephalexin Other (See Comments)    Reaction unknown  . Clarithromycin Other (See Comments)    Reaction unknown  . Lidocaine   . Nifedipine Other (See Comments)    Reaction unknown  . Nsaids Other (See Comments)    Heart issue  . Olmesartan Medoxomil     REACTION: cough;  But tolerating Losartan 02/2014  . Penicillins   . Tramadol Hcl Other (See Comments)    Reaction unknown    Medications: I have reviewed the patient's current medications.  Results for orders placed or performed during the hospital encounter of 09/27/15 (from the past 48 hour(s))  Glucose, capillary     Status: Abnormal   Collection Time: 10/06/15 12:49 PM  Result Value Ref Range   Glucose-Capillary 133 (H) 65 - 99 mg/dL   Comment 1 Notify RN    Comment 2 Document in Chart   Glucose, capillary     Status: Abnormal   Collection Time: 10/06/15  4:23 PM  Result Value Ref Range   Glucose-Capillary 139 (H) 65 - 99 mg/dL   Comment 1 Notify RN    Comment 2 Document in Chart   Glucose, capillary     Status: Abnormal   Collection Time: 10/06/15 10:03 PM  Result Value Ref Range   Glucose-Capillary 148 (H) 65 - 99 mg/dL  Basic metabolic panel     Status: Abnormal   Collection Time: 10/07/15  4:00 AM  Result Value Ref Range   Sodium 128 (L) 135 - 145 mmol/L   Potassium 4.6 3.5 - 5.1 mmol/L   Chloride 92 (L) 101 - 111 mmol/L   CO2 29 22 - 32 mmol/L   Glucose, Bld 112 (H) 65 - 99 mg/dL   BUN 35 (H) 6 - 20 mg/dL    Creatinine, Ser 1.04 (H) 0.44 - 1.00 mg/dL   Calcium 8.0 (L) 8.9 - 10.3 mg/dL   GFR calc non Af Amer 48 (L) >60 mL/min   GFR calc Af Amer 56 (L) >60 mL/min    Comment: (NOTE) The eGFR has been calculated using the CKD EPI equation. This calculation has not been validated in all clinical situations. eGFR's persistently <60 mL/min signify possible Chronic Kidney Disease.    Anion gap 7 5 - 15  Glucose, capillary     Status: None   Collection Time: 10/07/15  6:47 AM  Result Value Ref Range   Glucose-Capillary 92 65 - 99 mg/dL  Glucose, capillary     Status: Abnormal   Collection Time: 10/07/15 10:50 AM  Result Value Ref Range  Glucose-Capillary 126 (H) 65 - 99 mg/dL  Glucose, capillary     Status: Abnormal   Collection Time: 10/07/15  4:38 PM  Result Value Ref Range   Glucose-Capillary 128 (H) 65 - 99 mg/dL  Glucose, capillary     Status: Abnormal   Collection Time: 10/07/15  9:19 PM  Result Value Ref Range   Glucose-Capillary 108 (H) 65 - 99 mg/dL   Comment 1 Notify RN    Comment 2 Document in Chart   Basic metabolic panel     Status: Abnormal   Collection Time: 10/08/15  4:34 AM  Result Value Ref Range   Sodium 131 (L) 135 - 145 mmol/L   Potassium 3.9 3.5 - 5.1 mmol/L   Chloride 91 (L) 101 - 111 mmol/L   CO2 32 22 - 32 mmol/L   Glucose, Bld 97 65 - 99 mg/dL   BUN 37 (H) 6 - 20 mg/dL   Creatinine, Ser 1.16 (H) 0.44 - 1.00 mg/dL   Calcium 7.9 (L) 8.9 - 10.3 mg/dL   GFR calc non Af Amer 42 (L) >60 mL/min   GFR calc Af Amer 49 (L) >60 mL/min    Comment: (NOTE) The eGFR has been calculated using the CKD EPI equation. This calculation has not been validated in all clinical situations. eGFR's persistently <60 mL/min signify possible Chronic Kidney Disease.    Anion gap 8 5 - 15  Glucose, capillary     Status: None   Collection Time: 10/08/15  6:43 AM  Result Value Ref Range   Glucose-Capillary 81 65 - 99 mg/dL    No results found.  Review of Systems  Respiratory:  Positive for cough. Negative for hemoptysis, sputum production and shortness of breath.   Cardiovascular: Negative for chest pain, palpitations, orthopnea and leg swelling.  Gastrointestinal: Negative for nausea and vomiting.  Genitourinary: Negative for dysuria.  Neurological: Negative for dizziness and headaches.   Blood pressure 176/77, pulse 89, temperature 97.7 F (36.5 C), temperature source Oral, resp. rate 20, height 5' 6"  (1.676 m), weight 80.65 kg (177 lb 12.8 oz), SpO2 94 %. Physical Exam  Constitutional: She is oriented to person, place, and time.  HENT:  Head: Normocephalic and atraumatic.  Eyes: Conjunctivae are normal. Left eye exhibits no discharge. No scleral icterus.  Neck: Neck supple. No JVD present. No tracheal deviation present. No thyromegaly present.  Cardiovascular: Normal rate and regular rhythm.   Murmur (soft systolic murmur noted) heard. Respiratory:  Decreased breath sound at bases.  Good air entry bilaterally  GI: Soft. Bowel sounds are normal. She exhibits no distension. There is no tenderness.  Musculoskeletal: She exhibits no tenderness.  Neurological: She is alert and oriented to person, place, and time.    Assessment/Plan: Status post acute respiratory failure with hypoxia Resolving decompensated systolic/diastolic congestive heart failure. Status post influenza A bronchitis. Coronary artery disease, history of non-Q-wave MI 2 in the past, status post PCI to distal RCA and LAD in the past.with patent stents Hypertension. Hypercholesterolemia. Resolving hyponatremia COPD Chronic kidney disease stage III Steroid-induced hyperglycemia. History of right adrenal mass History of cerebrovascular disease. History of CA of breast. Spinal stenosis. Vitamin D deficiency. Osteoporosis. History of compression fracture of thoracic spine with kyphosis Plan Agree with present management. Increase ambulation as tolerated. Strict I and O's and daily  weights. Hopefully discharge home soon Charolette Forward 10/08/2015, 10:09 AM

## 2015-10-08 NOTE — Progress Notes (Signed)
Utilization review complete. Tangi Shroff RN CCM Case Mgmt phone [phone] 

## 2015-10-08 NOTE — Progress Notes (Signed)
PATIENT DETAILS Name: Haley Harris Age: 82 y.o. Sex: female Date of Birth: [birth year] Admit Date: 09/27/2015 Admitting Physician Geradine Girt, DO [place]:[id number] Silvio Pate, MD  Subjective: Breathing better-weight decreasing  Assessment/Plan: Principal Problem: Acute respiratory failure with hypoxia secondary to acute bronchitis: Due to influenza. Much mproved-moving air well, hardly any rhonchi heard today. Now transitioned from Solu-Medrol to prednisone. Continue nebulized bronchodilators and other supportive measures. Completed IV Levaquin on 3/23. Encourage incentive spirometry, flutter valve.  Active Problems: Influenza A: Likely causing above, since already improving-and since was more than 5 days from initial symptoms-we will continue to monitor off Tamiflu- continue to provide supportive care  Acute on chronic systolic heart failure (EF by TTE July 2016-40%): Much better-still with some mild volume overload-weight-continue IV Lasix TID, follow.   Hyponatremia: Likely secondary to acute systolic heart failure. Slowly improving with  fluid restriction and Lasix. Follow   History of CAD: Currently stable without any chest pain or shortness of breath. Continue aspirin, Coreg and pravastatin.  Hypertension: Controlled with Coreg, resume losartan in the next few days.  CKD stage III: Creatinine close to usual baseline, follow.  Steroid-induced hyperglycemia: Continue SSI while on steroids. A1c 6.0.  History of right adrenal mass:Chronic issue-stable for continued outpatient monitoring by PCP.  Disposition: Remain inpatient-suspect home tomorrow  Antimicrobial agents  See below  Anti-infectives    Start     Dose/Rate Route Frequency Ordered Stop   10/01/15 0700  levofloxacin (LEVAQUIN) tablet 750 mg  Status:  Discontinued     750 mg Oral Every 48 hours 09/30/15 1312 10/03/15 1116   09/29/15 0700  levofloxacin (LEVAQUIN) IVPB 750 mg  Status:  Discontinued     750 mg 100 mL/hr over 90 Minutes Intravenous Every 48 hours 09/27/15 1045 09/30/15 1312   09/27/15 1045  levofloxacin (LEVAQUIN) IVPB 750 mg  Status:  Discontinued     750 mg 100 mL/hr over 90 Minutes Intravenous Every 24 hours 09/27/15 1033 09/27/15 1045   09/27/15 0700  levofloxacin (LEVAQUIN) IVPB 500 mg     500 mg 100 mL/hr over 60 Minutes Intravenous  Once 09/27/15 0640 09/27/15 0803      DVT Prophylaxis: Prophylactic Lovenox  Code Status: Full code   Family Communication None at bedside  Procedures: None  CONSULTS:  None  Time spent 25 minutes-Greater than 50% of this time was spent in counseling, explanation of diagnosis, planning of further management, and coordination of care.  MEDICATIONS: Scheduled Meds: . aspirin  81 mg Oral QPC lunch  . budesonide  0.25 mg Nebulization BID  . carvedilol  6.25 mg Oral BID WC  . enoxaparin (LOVENOX) injection  40 mg Subcutaneous Q24H  . escitalopram  10 mg Oral Daily  . famotidine  20 mg Oral Daily  . fluticasone  2 spray Each Nare Daily  . furosemide  40 mg Intravenous TID  . gabapentin  100 mg Oral BID  . guaiFENesin  1,200 mg Oral BID  . insulin aspart  0-15 Units Subcutaneous TID WC  . insulin aspart  0-5 Units Subcutaneous QHS  . ipratropium-albuterol  3 mL Nebulization TID  . loratadine  10 mg Oral Daily  . montelukast  10 mg Oral QHS  . multivitamin with minerals  1 tablet Oral QPC lunch  . potassium chloride  20 mEq Oral BID  . pravastatin  80 mg Oral q1800  . predniSONE  40 mg Oral  Q breakfast  . sodium chloride flush  3 mL Intravenous Q12H  . spironolactone  12.5 mg Oral Daily   Continuous Infusions:  PRN Meds:.sodium chloride, acetaminophen, benzonatate, docusate sodium, hydrALAZINE, ipratropium-albuterol, LORazepam, ondansetron (ZOFRAN) IV, oxyCODONE-acetaminophen, sodium chloride flush    PHYSICAL EXAM: Vital signs in last 24 hours: Filed Vitals:   10/08/15 0615 10/08/15 0834 10/08/15 0925 10/08/15  1134  BP: 168/66 176/77  139/78  Pulse: 77 89  88  Temp: 97.7 F (36.5 C)   98 F (36.7 C)  TempSrc: Oral   Oral  Resp: 20   18  Height:      Weight: 80.65 kg (177 lb 12.8 oz)     SpO2: 98%  94% 96%    Weight change: -0.998 kg (-2 lb 3.2 oz) Filed Weights   10/06/15 0520 10/07/15 0639 10/08/15 0615  Weight: 81.239 kg (179 lb 1.6 oz) 81.647 kg (180 lb) 80.65 kg (177 lb 12.8 oz)   Body mass index is 28.71 kg/(m^2).   Gen Exam: Awake and alert with clear speech.  Appears comfortable-not in any distress Neck: Supple   Chest: Moving air bilaterally-No rhonchi today CVS: S1 S2 Regular, no murmurs.  Abdomen: soft, BS +, non tender, non distended.  Extremities: no edema, lower extremities warm to touch. Neurologic: Non Focal.   Skin: No Rash.   Wounds: N/A.   Intake/Output from previous day:  Intake/Output Summary (Last 24 hours) at 10/08/15 1233 Last data filed at 10/08/15 1111  Gross per 24 hour  Intake   1320 ml  Output   3500 ml  Net  -2180 ml     LAB RESULTS: CBC  Recent Labs Lab 10/02/15 0944  WBC 7.8  HGB 11.4*  HCT 32.4*  PLT 175  MCV 94.7  MCH 33.3  MCHC 35.2  RDW 12.2    Chemistries   Recent Labs Lab 10/04/15 0545 10/05/15 0256 10/06/15 0305 10/07/15 0400 10/08/15 0434  NA 127* 127* 127* 128* 131*  K 4.6 4.8 4.6 4.6 3.9  CL 90* 90* 88* 92* 91*  CO2 30 29 31 29  32  GLUCOSE 130* 116* 119* 112* 97  BUN 34* 35* 37* 35* 37*  CREATININE 1.19* 0.99 1.17* 1.04* 1.16*  CALCIUM 7.5* 7.6* 7.9* 8.0* 7.9*    CBG:  Recent Labs Lab 10/07/15 1050 10/07/15 1638 10/07/15 2119 10/08/15 0643 10/08/15 1132  GLUCAP 126* 128* 108* 81 143*    GFR Estimated Creatinine Clearance: 38.7 mL/min (by C-G formula based on Cr of 1.16).  Coagulation profile No results for input(s): INR, PROTIME in the last 168 hours.  Cardiac Enzymes No results for input(s): CKMB, TROPONINI, MYOGLOBIN in the last 168 hours.  Invalid input(s): CK  Invalid input(s):  POCBNP No results for input(s): DDIMER in the last 72 hours. No results for input(s): HGBA1C in the last 72 hours. No results for input(s): CHOL, HDL, LDLCALC, TRIG, CHOLHDL, LDLDIRECT in the last 72 hours. No results for input(s): TSH, T4TOTAL, T3FREE, THYROIDAB in the last 72 hours.  Invalid input(s): FREET3 No results for input(s): VITAMINB12, FOLATE, FERRITIN, TIBC, IRON, RETICCTPCT in the last 72 hours. No results for input(s): LIPASE, AMYLASE in the last 72 hours.  Urine Studies No results for input(s): UHGB, CRYS in the last 72 hours.  Invalid input(s): UACOL, UAPR, USPG, UPH, UTP, UGL, UKET, UBIL, UNIT, UROB, ULEU, UEPI, UWBC, URBC, UBAC, CAST, UCOM, BILUA  MICROBIOLOGY: No results found for this or any previous visit (from the past 240 hour(s)).  RADIOLOGY  STUDIES/RESULTS: Dg Chest 2 View  09/28/2015  CLINICAL DATA:  Acute respiratory failure with hypoxia EXAM: CHEST  2 VIEW COMPARISON:  Chest radiograph from one day prior. FINDINGS: Stable cardiomediastinal silhouette with mild cardiomegaly. No pneumothorax. No pleural effusion. Mild patchy and curvilinear opacities at the right lung apex and right lung base, slightly improved. IMPRESSION: Slightly improved curvilinear and patchy opacities at the right lung apex and right lung base, favor bronchitis/bronchopneumonia. Cannot exclude a component of mild pulmonary edema given the cardiomegaly. Electronically Signed   By: Ilona Sorrel M.D.   On: 09/28/2015 10:50   Dg Chest 2 View  09/24/2015  CLINICAL DATA:  Congestion, cough and exertional shortness breath for 3 days. History of CHF. EXAM: CHEST  2 VIEW COMPARISON:  Chest x-rays dated 01/26/2015 and 03/01/2014. FINDINGS: Mild cardiomegaly is stable. Overall cardiomediastinal silhouette is stable in size and configuration. Prominence of the pulmonary arteries suggests chronic pulmonary artery hypertension. Coarse interstitial markings are again seen within each lung, similar to the  previous exam of 01/26/2015. No evidence of a superimposed interstitial edema on today's exam, as was seen on earlier chest x-ray of 01/18/2015. No confluent airspace opacity to suggest a developing pneumonia. No pleural effusions seen. Degenerative changes are again noted throughout the kyphotic thoracic spine. Multiple chronic compression fracture deformities again appreciated within the thoracic spine. No acute- appearing osseous abnormality. IMPRESSION: Stable chest x-ray. No evidence of acute cardiopulmonary abnormality. Stable cardiomegaly. Evidence of chronic interstitial lung disease. Evidence of chronic pulmonary artery hypertension. Multiple old compression fracture deformities within thoracic spine. Electronically Signed   By: Franki Cabot M.D.   On: 09/24/2015 19:02   Ct Chest Wo Contrast  09/25/2015  CLINICAL DATA:  Fever and productive cough. Isolated left upper lobe wheezing. Evaluate for pneumonia EXAM: CT CHEST WITHOUT CONTRAST TECHNIQUE: Multidetector CT imaging of the chest was performed following the standard protocol without IV contrast. COMPARISON:  01/19/2015 FINDINGS: THORACIC INLET/BODY WALL: Right mastectomy. MEDIASTINUM: Chronic cardiomegaly with trace pericardial fluid or thickening. No evidence of acute vascular disease. Diffuse atherosclerosis, including the coronary arteries. No adenopathy. Negative esophagus. LUNG WINDOWS: Mild dependent atelectasis or scar. Trace pleural effusions. Lungs have a stable appearance since 2016. There is no edema, consolidation, effusion, or pneumothorax. UPPER ABDOMEN: Chronic 3 cm right adrenal mass, benign given stability and attributed to adenoma. Benign hepatic low densities which are small. OSSEOUS: Remote compression fractures at T1, T6, T8, and T12. The T6 and T12 fractures have advanced height loss with vertebra plana. No acute osseous finding. IMPRESSION: 1. Negative for pneumonia.  Stable exam compared to July 2016. 2. Chronic basilar  atelectasis/ scarring and trace effusions. Electronically Signed   By: Monte Fantasia M.D.   On: 09/25/2015 02:30   Dg Chest Port 1 View  09/29/2015  CLINICAL DATA:  Productive cough for the past 6 days.  Slight fever. EXAM: PORTABLE CHEST 1 VIEW COMPARISON:  Yesterday. FINDINGS: Stable enlarged cardiac silhouette and prominence of the interstitial markings. Small amount of linear density in the right lower lung zone with little change. Otherwise, no airspace opacity. Diffuse osteopenia. Old, healed right humeral neck fracture. IMPRESSION: Stable mild cardiomegaly, mild interstitial lung disease mild right basilar atelectasis or scarring Electronically Signed   By: Claudie Revering M.D.   On: 09/29/2015 09:56   Dg Chest Portable 1 View  09/27/2015  CLINICAL DATA:  Shortness of breath EXAM: PORTABLE CHEST 1 VIEW COMPARISON:  09/24/2015 FINDINGS: There is symmetric and diffuse interstitial coarsening above baseline. Chronic  hyperinflation and cardiomegaly. Negative aortic and hilar contours. No effusion or air leak. IMPRESSION: Progressive interstitial coarsening which could be bronchitic or congestive. Electronically Signed   By: Monte Fantasia M.D.   On: 09/27/2015 04:41   Dg Chest Port 1v Same Day  10/02/2015  CLINICAL DATA:  Shortness of breath EXAM: PORTABLE CHEST 1 VIEW COMPARISON:  09/29/15 chest radiograph. FINDINGS: Stable cardiomediastinal silhouette with mild cardiomegaly. No pneumothorax. Probable new trace bilateral pleural effusions. Stable mild pulmonary edema. Stable mild platelike atelectasis at both lung bases. IMPRESSION: 1. Stable mild congestive heart failure. 2. Probable new trace bilateral pleural effusions. 3. Stable mild platelike atelectasis at both lung bases. Electronically Signed   By: Ilona Sorrel M.D.   On: 10/02/2015 10:45    Oren Binet, MD  [facility] Pager:[phone]  If 7PM-7AM, please contact night-coverage [email or web address] Password [facility] 10/08/2015, 12:33  PM   LOS: 11 days

## 2015-10-08 NOTE — Progress Notes (Signed)
Physical Therapy Treatment Patient Details Name: Haley Harris MRN: [id number] DOB: 05-Apr-1929 Today's Date: 10/08/2015    History of Present Illness 82 year old female with a past medical history significant for CHF and COPD; who presents with three-day history of shortness of breath; Acute respiratory failure with hypoxia secondary to COPD with acute bronchitis from influenza and CAP without sepsis    PT Comments    Patient mobilizing well. Performed stair negotiation and discussed mobility expectations for discharge as well as HHPT follow up. All activity on room air with no evidence of DOE.   Follow Up Recommendations  Home health PT;Supervision for mobility/OOB     Equipment Recommendations  None recommended by PT    Recommendations for Other Services       Precautions / Restrictions Precautions Precautions: Fall Restrictions Weight Bearing Restrictions: No    Mobility  Bed Mobility Overal bed mobility: Modified Independent             General bed mobility comments: increased time to perform, no physical assist required.   Transfers Overall transfer level: Needs assistance Equipment used: None   Sit to Stand: Supervision         General transfer comment: no physical assist required  Ambulation/Gait Ambulation/Gait assistance: Supervision Ambulation Distance (Feet): 60 Feet Assistive device: Rolling walker (2 wheeled) Gait Pattern/deviations: Step-through pattern;Decreased stride length;Trunk flexed;Drifts right/left Gait velocity: decreased   General Gait Details: on room air, mobilizing well   Stairs Stairs: Yes Stairs assistance: Min assist Stair Management: One rail Right;Forwards;Step to pattern Number of Stairs: 3 General stair comments: performed forward going up and backward going down with HHA and rail  Wheelchair Mobility    Modified Rankin (Stroke Patients Only)       Balance     Sitting balance-Leahy Scale: Good        Standing balance-Leahy Scale: Fair                      Cognition Arousal/Alertness: Awake/alert Behavior During Therapy: WFL for tasks assessed/performed Overall Cognitive Status: Within Functional Limits for tasks assessed                      Exercises      General Comments General comments (skin integrity, edema, etc.): performed stair negotiation and discussed mobility expectations for discharge as well as HHPT follow up      Pertinent Vitals/Pain Pain Assessment: No/denies pain    Home Living                      Prior Function            PT Goals (current goals can now be found in the care plan section) Acute Rehab PT Goals Patient Stated Goal: to go home PT Goal Formulation: With patient Time For Goal Achievement: 10/16/15 Potential to Achieve Goals: Good Progress towards PT goals: Progressing toward goals    Frequency  Min 3X/week    PT Plan Current plan remains appropriate    Co-evaluation             End of Session Equipment Utilized During Treatment: Gait belt Activity Tolerance: Patient limited by fatigue;Patient tolerated treatment well Patient left: in bed;with call bell/phone within reach     Time: 1602-1619 PT Time Calculation (min) (ACUTE ONLY): 17 min  Charges:  $Gait Training: 8-22 mins  G CodesDuncan Dull October 19, 2015, 4:42 PM Alben Deeds, [place] DPT  [phone]

## 2015-10-09 ENCOUNTER — Other Ambulatory Visit: Payer: Self-pay

## 2015-10-09 LAB — GLUCOSE, CAPILLARY
GLUCOSE-CAPILLARY: 175 mg/dL — AB (ref 65–99)
Glucose-Capillary: 124 mg/dL — ABNORMAL HIGH (ref 65–99)
Glucose-Capillary: 85 mg/dL (ref 65–99)

## 2015-10-09 LAB — BASIC METABOLIC PANEL
Anion gap: 9 (ref 5–15)
BUN: 36 mg/dL — AB (ref 6–20)
CALCIUM: 8.2 mg/dL — AB (ref 8.9–10.3)
CHLORIDE: 88 mmol/L — AB (ref 101–111)
CO2: 34 mmol/L — AB (ref 22–32)
CREATININE: 1.2 mg/dL — AB (ref 0.44–1.00)
GFR calc non Af Amer: 40 mL/min — ABNORMAL LOW (ref >60)
GFR, EST AFRICAN AMERICAN: 47 mL/min — AB (ref >60)
GLUCOSE: 90 mg/dL (ref 65–99)
Potassium: 3.6 mmol/L (ref 3.5–5.1)
Sodium: 131 mmol/L — ABNORMAL LOW (ref 135–145)

## 2015-10-09 MED ORDER — FUROSEMIDE 40 MG PO TABS: 40.0000 mg | ORAL_TABLET | Freq: Two times a day (BID) | ORAL | Status: DC

## 2015-10-09 MED ORDER — POTASSIUM CHLORIDE CRYS ER 20 MEQ PO TBCR: 20.0000 meq | EXTENDED_RELEASE_TABLET | Freq: Two times a day (BID) | ORAL | Status: DC

## 2015-10-09 MED ORDER — ALBUTEROL SULFATE HFA 108 (90 BASE) MCG/ACT IN AERS: 2.0000 | INHALATION_SPRAY | Freq: Four times a day (QID) | RESPIRATORY_TRACT | Status: DC | PRN

## 2015-10-09 MED ORDER — PREDNISONE 10 MG PO TABS: ORAL_TABLET | ORAL | Status: DC

## 2015-10-09 NOTE — Progress Notes (Signed)
Subjective:  Doing well denies any chest pain or shortness of breath. No cough fever or chills. Leg swelling completely resolved.  Objective:  Vital Signs in the last 24 hours: Temp:  [97.8 F (36.6 C)-98.6 F (37 C)] 97.8 F (36.6 C) (03/29 0556) Pulse Rate:  [73-97] 73 (03/29 0556) Resp:  [18-20] 18 (03/29 0556) BP: (128-153)/(60-78) 153/71 mmHg (03/29 0556) SpO2:  [96 %-99 %] 97 % (03/29 0935) Weight:  [79.153 kg (174 lb 8 oz)] 79.153 kg (174 lb 8 oz) (03/29 0556)  Intake/Output from previous day: 03/28 0701 - 03/29 0700 In: 600 [P.O.:600] Out: 2050 [Urine:2050] Intake/Output from this shift: Total I/O In: 120 [P.O.:120] Out: -   Physical Exam: Neck: no adenopathy, no carotid bruit, no JVD and supple, symmetrical, trachea midline Lungs: clear to auscultation bilaterally Heart: regular rate and rhythm, regularly irregular rhythm and Soft systolic murmur noted Abdomen: soft, non-tender; bowel sounds normal; no masses,  no organomegaly Extremities: extremities normal, atraumatic, no cyanosis or edema  Lab Results: No results for input(s): WBC, HGB, PLT in the last 72 hours.  Recent Labs  10/08/15 0434 10/09/15 0415  NA 131* 131*  K 3.9 3.6  CL 91* 88*  CO2 32 34*  GLUCOSE 97 90  BUN 37* 36*  CREATININE 1.16* 1.20*   No results for input(s): TROPONINI in the last 72 hours.  Invalid input(s): CK, MB Hepatic Function Panel No results for input(s): PROT, ALBUMIN, AST, ALT, ALKPHOS, BILITOT, BILIDIR, IBILI in the last 72 hours. No results for input(s): CHOL in the last 72 hours. No results for input(s): PROTIME in the last 72 hours.  Imaging: Imaging results have been reviewed and No results found.  Cardiac Studies:  Assessment/Plan:  Status post acute respiratory failure with hypoxia  Compensated systolic/diastolic congestive heart failure. Status post influenza A bronchitis. Coronary artery disease, history of non-Q-wave MI 2 in the past, status post PCI  to distal RCA and LAD in the past.with patent stents Hypertension. Hypercholesterolemia. Resolving hyponatremia COPD Chronic kidney disease stage III Steroid-induced hyperglycemia. History of right adrenal mass History of cerebrovascular disease. History of CA of breast. Spinal stenosis. Vitamin D deficiency. Osteoporosis. History of compression fracture of thoracic spine with kyphosis Plan Continue present management Okay to discharge from cardiac point of view Follow-up with me in one to 2 weeks Heart failure instructions have been given  LOS: 12 days    Charolette Forward 10/09/2015, 9:58 AM

## 2015-10-09 NOTE — Discharge Summary (Signed)
PATIENT DETAILS Name: Haley Harris Age: 82 y.o. Sex: female Date of Birth: 10-04-1928 MRN: [id number]. Admitting Physician: Geradine Girt, DO [place]:[id number] Silvio Pate, MD  Admit Date: 09/27/2015 Discharge date: 10/09/2015  Recommendations for Outpatient Follow-up:  1. Please repeat CBC/BMET at next visit  PRIMARY DISCHARGE DIAGNOSIS:  Principal Problem:   Acute respiratory failure with hypoxia ([place]) Active Problems:   Dyslipidemia   CAD (coronary artery disease), native coronary artery   Acute on chronic combined systolic and diastolic CHF, NYHA class 1 (HCC)   Neuropathy due to peripheral vascular disease (HCC)   CKD (chronic kidney disease), stage III   Right adrenal mass (HCC)   Hyponatremia   COPD with acute bronchitis (HCC)   AKI (acute kidney injury) ([place])   Acute hyperkalemia      PAST MEDICAL HISTORY: Past Medical History  Diagnosis Date  . Allergic rhinitis, cause unspecified   . Unspecified cardiovascular disease   . Personal history of malignant neoplasm of breast   . Occlusion and stenosis of carotid artery without mention of cerebral infarction   . Chronic airway obstruction, not elsewhere classified   . Depressive disorder, not elsewhere classified   . Other dyspnea and respiratory abnormality   . Esophageal reflux   . Unspecified hearing loss   . Other and unspecified hyperlipidemia   . Unspecified essential hypertension   . Osteoporosis, unspecified   . Peripheral vascular disease, unspecified ([place])   . Unspecified urinary incontinence   . Spinal stenosis   . ACE-inhibitor cough   . Angina   . Heart murmur     aS CHILD  . Shortness of breath   . Recurrent upper respiratory infection (URI)   . Anxiety   . Pneumonia   . Neuromuscular disorder ([place])     NEROPATHY FROM STENOSIS  . Myocardial infarct ([place])   . Osteoarthrosis, unspecified whether generalized or localized, unspecified site   . Malignant neoplasm of breast (female), unspecified site     . Compression fracture of T12 vertebra ([place]) 10/28/2011  . CHF (congestive heart failure) ([place])     DISCHARGE MEDICATIONS: Current Discharge Medication List    START taking these medications   Details  albuterol (PROVENTIL HFA;VENTOLIN HFA) 108 (90 Base) MCG/ACT inhaler Inhale 2 puffs into the lungs every 6 (six) hours as needed for wheezing or shortness of breath. Qty: 1 Inhaler, Refills: 0    potassium chloride SA (K-DUR,KLOR-CON) 20 MEQ tablet Take 1 tablet (20 mEq total) by mouth 2 (two) times daily. Qty: 60 tablet, Refills: 0      CONTINUE these medications which have CHANGED   Details  furosemide (LASIX) 40 MG tablet Take 1 tablet (40 mg total) by mouth 2 (two) times daily. Qty: 60 tablet, Refills: 0    predniSONE (DELTASONE) 10 MG tablet Take 4 tablets (40 mg) daily for 2 days, then, Take 3 tablets (30 mg) daily for 2 days, then, Take 2 tablets (20 mg) daily for 2 days, then, Take 1 tablets (10 mg) daily for 1 days, then stop Qty: 19 tablet, Refills: 0      CONTINUE these medications which have NOT CHANGED   Details  acetaminophen (TYLENOL) 325 MG tablet Take 2 tablets (650 mg total) by mouth every 4 (four) hours as needed for headache or mild pain.    albuterol (PROVENTIL) (2.5 MG/3ML) 0.083% nebulizer solution Take 3 mLs (2.5 mg total) by nebulization every 6 (six) hours as needed for wheezing or shortness of breath. Qty: 540 mL,  Refills: 3    aspirin 81 MG tablet Take 81 mg by mouth daily after lunch.     Calcium Carbonate-Vitamin D (CALTRATE 600+D PO) Take 1 tablet by mouth daily after lunch.     carvedilol (COREG) 12.5 MG tablet Take 1 tablet (12.5 mg total) by mouth 2 (two) times daily with a meal. Qty: 180 tablet, Refills: 1    cetirizine (ZYRTEC) 10 MG tablet Take 10 mg by mouth daily. For allergies    denosumab (PROLIA) 60 MG/ML SOLN injection Inject 60 mg into the skin every 6 (six) months. Administer in upper arm, thigh, or abdomen    escitalopram  (LEXAPRO) 10 MG tablet Take 1 tablet by mouth  daily Qty: 90 tablet, Refills: 3    famotidine (PEPCID) 20 MG tablet Take 1 tablet (20 mg total) by mouth 2 (two) times daily. Qty: 180 tablet, Refills: 1    gabapentin (NEURONTIN) 100 MG capsule Take 1 capsule (100 mg total) by mouth 2 (two) times daily. Qty: 180 capsule, Refills: 3    losartan (COZAAR) 50 MG tablet TAKE 1 TABLET BY MOUTH DAILY Qty: 90 tablet, Refills: 1    montelukast (SINGULAIR) 10 MG tablet TAKE 1 TABLET BY MOUTH DAILY Qty: 90 tablet, Refills: 1    Multiple Vitamin (MULITIVITAMIN WITH MINERALS) TABS Take 1 tablet by mouth daily after lunch.     oxyCODONE-acetaminophen (PERCOCET/ROXICET) 5-325 MG per tablet Take 1-2 tablets by mouth every 4 (four) hours as needed. Qty: 30 tablet, Refills: 0    pravastatin (PRAVACHOL) 80 MG tablet Take 1 tablet by mouth  daily Qty: 90 tablet, Refills: 3    spironolactone (ALDACTONE) 25 MG tablet Take 1 tablet by mouth  daily Qty: 90 tablet, Refills: 3    vitamin B-12 (CYANOCOBALAMIN) 500 MCG tablet Take 1,000 mcg by mouth 3 (three) times a week. Mon, Wed, Fri      STOP taking these medications     fluticasone (FLONASE) 50 MCG/ACT nasal spray         ALLERGIES:   Allergies  Allergen Reactions  . Amoxicillin-Pot Clavulanate Anaphylaxis  . Cephalexin Other (See Comments)    Reaction unknown  . Clarithromycin Other (See Comments)    Reaction unknown  . Lidocaine   . Nifedipine Other (See Comments)    Reaction unknown  . Nsaids Other (See Comments)    Heart issue  . Olmesartan Medoxomil     REACTION: cough;  But tolerating Losartan 02/2014  . Penicillins   . Tramadol Hcl Other (See Comments)    Reaction unknown    BRIEF HPI:  See H&P, Labs, Consult and Test reports for all details in brief, patient 82 year old female patient with history of CAD with prior stents to the LAD and RCA, combined chronic diastolic and systolic heart failure with EF 45-50% left heart  catheterization July 2016, dyslipidemia, diabetes, peripheral vascular disease and hypertension who presented on 3/17 with shortness of breath.  CONSULTATIONS:   cardiology  PERTINENT RADIOLOGIC STUDIES: Dg Chest 2 View  09/28/2015  CLINICAL DATA:  Acute respiratory failure with hypoxia EXAM: CHEST  2 VIEW COMPARISON:  Chest radiograph from one day prior. FINDINGS: Stable cardiomediastinal silhouette with mild cardiomegaly. No pneumothorax. No pleural effusion. Mild patchy and curvilinear opacities at the right lung apex and right lung base, slightly improved. IMPRESSION: Slightly improved curvilinear and patchy opacities at the right lung apex and right lung base, favor bronchitis/bronchopneumonia. Cannot exclude a component of mild pulmonary edema given the cardiomegaly. Electronically Signed  By: Ilona Sorrel M.D.   On: 09/28/2015 10:50   Dg Chest 2 View  09/24/2015  CLINICAL DATA:  Congestion, cough and exertional shortness breath for 3 days. History of CHF. EXAM: CHEST  2 VIEW COMPARISON:  Chest x-rays dated 01/26/2015 and 03/01/2014. FINDINGS: Mild cardiomegaly is stable. Overall cardiomediastinal silhouette is stable in size and configuration. Prominence of the pulmonary arteries suggests chronic pulmonary artery hypertension. Coarse interstitial markings are again seen within each lung, similar to the previous exam of 01/26/2015. No evidence of a superimposed interstitial edema on today's exam, as was seen on earlier chest x-ray of 01/18/2015. No confluent airspace opacity to suggest a developing pneumonia. No pleural effusions seen. Degenerative changes are again noted throughout the kyphotic thoracic spine. Multiple chronic compression fracture deformities again appreciated within the thoracic spine. No acute- appearing osseous abnormality. IMPRESSION: Stable chest x-ray. No evidence of acute cardiopulmonary abnormality. Stable cardiomegaly. Evidence of chronic interstitial lung disease.  Evidence of chronic pulmonary artery hypertension. Multiple old compression fracture deformities within thoracic spine. Electronically Signed   By: Franki Cabot M.D.   On: 09/24/2015 19:02   Ct Chest Wo Contrast  09/25/2015  CLINICAL DATA:  Fever and productive cough. Isolated left upper lobe wheezing. Evaluate for pneumonia EXAM: CT CHEST WITHOUT CONTRAST TECHNIQUE: Multidetector CT imaging of the chest was performed following the standard protocol without IV contrast. COMPARISON:  01/19/2015 FINDINGS: THORACIC INLET/BODY WALL: Right mastectomy. MEDIASTINUM: Chronic cardiomegaly with trace pericardial fluid or thickening. No evidence of acute vascular disease. Diffuse atherosclerosis, including the coronary arteries. No adenopathy. Negative esophagus. LUNG WINDOWS: Mild dependent atelectasis or scar. Trace pleural effusions. Lungs have a stable appearance since 2016. There is no edema, consolidation, effusion, or pneumothorax. UPPER ABDOMEN: Chronic 3 cm right adrenal mass, benign given stability and attributed to adenoma. Benign hepatic low densities which are small. OSSEOUS: Remote compression fractures at T1, T6, T8, and T12. The T6 and T12 fractures have advanced height loss with vertebra plana. No acute osseous finding. IMPRESSION: 1. Negative for pneumonia.  Stable exam compared to July 2016. 2. Chronic basilar atelectasis/ scarring and trace effusions. Electronically Signed   By: Monte Fantasia M.D.   On: 09/25/2015 02:30   Dg Chest Port 1 View  09/29/2015  CLINICAL DATA:  Productive cough for the past 6 days.  Slight fever. EXAM: PORTABLE CHEST 1 VIEW COMPARISON:  Yesterday. FINDINGS: Stable enlarged cardiac silhouette and prominence of the interstitial markings. Small amount of linear density in the right lower lung zone with little change. Otherwise, no airspace opacity. Diffuse osteopenia. Old, healed right humeral neck fracture. IMPRESSION: Stable mild cardiomegaly, mild interstitial lung  disease mild right basilar atelectasis or scarring Electronically Signed   By: Claudie Revering M.D.   On: 09/29/2015 09:56   Dg Chest Portable 1 View  09/27/2015  CLINICAL DATA:  Shortness of breath EXAM: PORTABLE CHEST 1 VIEW COMPARISON:  09/24/2015 FINDINGS: There is symmetric and diffuse interstitial coarsening above baseline. Chronic hyperinflation and cardiomegaly. Negative aortic and hilar contours. No effusion or air leak. IMPRESSION: Progressive interstitial coarsening which could be bronchitic or congestive. Electronically Signed   By: Monte Fantasia M.D.   On: 09/27/2015 04:41   Dg Chest Port 1v Same Day  10/02/2015  CLINICAL DATA:  Shortness of breath EXAM: PORTABLE CHEST 1 VIEW COMPARISON:  09/29/15 chest radiograph. FINDINGS: Stable cardiomediastinal silhouette with mild cardiomegaly. No pneumothorax. Probable new trace bilateral pleural effusions. Stable mild pulmonary edema. Stable mild platelike atelectasis at both lung  bases. IMPRESSION: 1. Stable mild congestive heart failure. 2. Probable new trace bilateral pleural effusions. 3. Stable mild platelike atelectasis at both lung bases. Electronically Signed   By: Ilona Sorrel M.D.   On: 10/02/2015 10:45     PERTINENT LAB RESULTS: CBC: No results for input(s): WBC, HGB, HCT, PLT in the last 72 hours. CMET CMP     Component Value Date/Time   NA 131* 10/09/2015 0415   K 3.6 10/09/2015 0415   K 5.0 05/31/2013 1552   CL 88* 10/09/2015 0415   CO2 34* 10/09/2015 0415   GLUCOSE 90 10/09/2015 0415   BUN 36* 10/09/2015 0415   CREATININE 1.20* 10/09/2015 0415   CALCIUM 8.2* 10/09/2015 0415   PROT 6.3* 09/27/2015 0435   ALBUMIN 3.5 09/27/2015 0435   AST 47* 09/27/2015 0435   ALT 17 09/27/2015 0435   ALKPHOS 48 09/27/2015 0435   BILITOT 1.1 09/27/2015 0435   GFRNONAA 40* 10/09/2015 0415   GFRAA 47* 10/09/2015 0415    GFR Estimated Creatinine Clearance: 37.1 mL/min (by C-G formula based on Cr of 1.2). No results for input(s):  LIPASE, AMYLASE in the last 72 hours. No results for input(s): CKTOTAL, CKMB, CKMBINDEX, TROPONINI in the last 72 hours. Invalid input(s): POCBNP No results for input(s): DDIMER in the last 72 hours. No results for input(s): HGBA1C in the last 72 hours. No results for input(s): CHOL, HDL, LDLCALC, TRIG, CHOLHDL, LDLDIRECT in the last 72 hours. No results for input(s): TSH, T4TOTAL, T3FREE, THYROIDAB in the last 72 hours.  Invalid input(s): FREET3 No results for input(s): VITAMINB12, FOLATE, FERRITIN, TIBC, IRON, RETICCTPCT in the last 72 hours. Coags: No results for input(s): INR in the last 72 hours.  Invalid input(s): PT Microbiology: No results found for this or any previous visit (from the past 240 hour(s)).   BRIEF HOSPITAL COURSE:  Acute respiratory failure with hypoxia secondary to acute bronchitis and decompensated systolic CHF: Due to influenza and acute syst CHF. Much mproved-moving air well, without any rhonchi or rales today.Treated with IV solumedrol, bronchodilators, levaquin and IV Lasix.  Now transitioned from Solu-Medrol to prednisone-which will be tapered off in 1 week.  Completed IV Levaquin on 3/23. Also on room air-no longer requiring oxygen.  Active Problems: Influenza A: Likely causing above,patient was managed with supportive care- since already improving-and since was more than 5 days from initial symptoms-she was not given Tamiflu. By day of discharge, symptomatically better-cough almost resolved-no SOB and on room air.   Acute on chronic systolic heart failure (EF by TTE July 2016-40%): Hospital course complicated by acute systolic CHF-managed with IV Lasix-by day of discharge-compensated with no evidence of fluid overload on exam. Will transition to oral lasix-will change dose to Lasix 40 mg BID (daily prior to this hospitalization), continue Aldactone, Coreg and Losartan. Was seen by Dr Terrence Dupont during this hospital stay. Spoke with Dr Terrence Dupont today-ok to d/c today  he will see patient in his office in 1-2 weeks.  Hyponatremia: Likely secondary to acute systolic heart failure. Na improved to 131 with fluid restriction and Lasix. Please check lytes at next visit.   History of CAD: Currently stable without any chest pain or shortness of breath. Continue aspirin, Coreg and pravastatin.  Hypertension: Controlled with Coreg, resume losartan in the next few days.  CKD stage III: Creatinine close to usual baseline, follow.  Steroid-induced hyperglycemia: Managed with SSI while on steroids. A1c 6.0.Does not need medications on discharge. Follow with PCP for periodic A1C checks  History  of right adrenal mass:Chronic issue-stable for continued outpatient monitoring by PCP.  TODAY-DAY OF DISCHARGE:  Subjective:   Haley Harris today has no headache,no chest abdominal pain,no new weakness tingling or numbness, feels much better wants to go home today.  Objective:   Blood pressure 153/71, pulse 73, temperature 97.8 F (36.6 C), temperature source Oral, resp. rate 18, height 5\' 6"  (1.676 m), weight 79.153 kg (174 lb 8 oz), SpO2 97 %.  Intake/Output Summary (Last 24 hours) at 10/09/15 0928 Last data filed at 10/09/15 0555  Gross per 24 hour  Intake    480 ml  Output   2050 ml  Net  -1570 ml   Filed Weights   10/07/15 0639 10/08/15 0615 10/09/15 0556  Weight: 81.647 kg (180 lb) 80.65 kg (177 lb 12.8 oz) 79.153 kg (174 lb 8 oz)    Exam Awake Alert, Oriented *3, No new F.N deficits, Normal affect [place].AT,PERRAL Supple Neck,No JVD, No cervical lymphadenopathy appriciated.  Symmetrical Chest wall movement, Good air movement bilaterally, CTAB RRR,No Gallops,Rubs or new Murmurs, No Parasternal Heave +ve B.Sounds, Abd Soft, Non tender, No organomegaly appriciated, No rebound -guarding or rigidity. No Cyanosis, Clubbing or edema, No new Rash or bruise  DISCHARGE CONDITION: Stable  DISPOSITION: Home with home health services  DISCHARGE INSTRUCTIONS:      Activity:  As tolerated   Get Medicines reviewed and adjusted: Please take all your medications with you for your next visit with your Primary MD  Please request your Primary MD to go over all hospital tests and procedure/radiological results at the follow up, please ask your Primary MD to get all Hospital records sent to his/her office.  If you experience worsening of your admission symptoms, develop shortness of breath, life threatening emergency, suicidal or homicidal thoughts you must seek medical attention immediately by calling 911 or calling your MD immediately  if symptoms less severe.  You must read complete instructions/literature along with all the possible adverse reactions/side effects for all the Medicines you take and that have been prescribed to you. Take any new Medicines after you have completely understood and accpet all the possible adverse reactions/side effects.   Do not drive when taking Pain medications.   Do not take more than prescribed Pain, Sleep and Anxiety Medications  Special Instructions: If you have smoked or chewed Tobacco  in the last 2 yrs please stop smoking, stop any regular Alcohol  and or any Recreational drug use.  Wear Seat belts while driving.  Please note  You were cared for by a hospitalist during your hospital stay. Once you are discharged, your primary care physician will handle any further medical issues. Please note that NO REFILLS for any discharge medications will be authorized once you are discharged, as it is imperative that you return to your primary care physician (or establish a relationship with a primary care physician if you do not have one) for your aftercare needs so that they can reassess your need for medications and monitor your lab values.   Diet recommendation: Heart Healthy diet  Discharge Instructions    (HEART FAILURE PATIENTS) Call MD:  Anytime you have any of the following symptoms: 1) 3 pound weight gain in 24  hours or 5 pounds in 1 week 2) shortness of breath, with or without a dry hacking cough 3) swelling in the hands, feet or stomach 4) if you have to sleep on extra pillows at night in order to breathe.    Complete by:  As  directed      Call MD for:  difficulty breathing, headache or visual disturbances    Complete by:  As directed      Diet - low sodium heart healthy    Complete by:  As directed      Increase activity slowly    Complete by:  As directed            Follow-up Information    Follow up with [facility].   Why:  They will do your home health care at your home   Contact information:   [address] [address] [place] [address] [address] [phone]       Follow up with [facility] on 10/18/2015.   Why:  PCP @9 :30am   Contact information:   [place] [phone]       Follow up with Charolette Forward, MD. Schedule an appointment as soon as possible for a visit in 1 week.   Specialty:  Cardiology   Why:  Hospital follow up   Contact information:   [place] [address] [phone]       Follow up with [name], MD On 10/11/2015.   Specialties:  Internal Medicine, Pediatrics   Why:  keep existing appt-for post hospital follow up (appt at 12 pm)   Contact information:   [place] [address] [phone]      Total Time spent on discharge equals  45 minutes.  SignedOren Binet 10/09/2015 9:28 AM

## 2015-10-09 NOTE — Consult Note (Signed)
   [facility] CM Inpatient Consult   10/09/2015  Haley Harris 29-Dec-1928 [id number] Patient evaluated for community based chronic disease management services with [facility] as a benefit of patient's [facility]. Spoke with patient at bedside to explain [place] [facility] services because of her long length of stay and Flu with HF exaceberation. Consult form signed. Patient will receive post hospital discharge call and will be evaluated for monthly home visits for assessments and disease process education.  Left contact information and [facility] literature at bedside. Made Inpatient Case Manager aware that [facility] following. Of note, [facility] Care Management services does not replace or interfere with any services that are arranged by inpatient case management or social work.  For additional questions or referrals please contact:   Natividad Brood, RN BSN [facility] Liaison  [phone] business mobile phone Toll free office [phone]

## 2015-10-10 ENCOUNTER — Telehealth: Payer: Self-pay | Admitting: Internal Medicine

## 2015-10-10 ENCOUNTER — Ambulatory Visit: Payer: Self-pay | Admitting: *Deleted

## 2015-10-10 ENCOUNTER — Telehealth: Payer: Self-pay | Admitting: *Deleted

## 2015-10-10 ENCOUNTER — Other Ambulatory Visit: Payer: Self-pay | Admitting: *Deleted

## 2015-10-10 NOTE — Telephone Encounter (Signed)
Okay Thank you  She should be in soon for a hospital follow up

## 2015-10-10 NOTE — Patient Outreach (Signed)
[facility]) [place] Telephone outreach, Transition of care week 1, attempt 1 10/10/2015  [name] 1928/11/02 [id number]  Successful telephone outreach to Ms. Haley Harris, referred to [place] for transition of care after recent IP hospital visit March 17-29, 2017 for ARF with hypoxia related to flu and CHF.  HIPPA verified.  [name] confirmed that she has [facility] services ordered post-discharge but states that she has not heard from them yet.  I confirmed that patient has the correct contact information for [facility]).  [name] also stated that she has a visit scheduled to her PCP and that she has and is taking her medications as they are prescribed.    [name] agreed to a home visit with this [place] next week, and we scheduled the visit for Monday, October 14, 2015 at 1:30 pm.  I made sure [name] had my contact information in case she wished to speak with me before our scheduled home visit on Monday.  Plan: [name] will keep all provider appointments. [name] will take her medications as they are prescribed post-discharge. [name] will contact [facility] services tomorrow afternoon if she has not heard from them before then. [place] home visit planned for Monday, October 14, 2015 at 1:30 pm.  Oneta Rack, RN, BSN, [place]  [phone]

## 2015-10-10 NOTE — Telephone Encounter (Signed)
I have electronically submitted Haley Harris info for [place] verification and will notify you once I have a response. Thank you.

## 2015-10-10 NOTE — Telephone Encounter (Signed)
Transition Care Management Follow-up Telephone Call   Date discharged? 10/09/15   How have you been since you were released from the hospital? Still tired, but improving.   Do you understand why you were in the hospital? yes   Do you understand the discharge instructions? yes   Where were you discharged to? home   Items Reviewed:  Medications reviewed: yes  Allergies reviewed: yes  Dietary changes reviewed: no  Referrals reviewed: no   Functional Questionnaire:   Activities of Daily Living (ADLs):   She states they are independent in the following: ambulation, bathing and hygiene, feeding, continence, grooming, toileting and dressing States they require assistance with the following: none   Any transportation issues/concerns?: no   Any patient concerns? yes, started on potassium in the hospital - ?need to continue    Confirmed importance and date/time of follow-up visits scheduled yes, 10/11/15 @ 1200  Provider Appointment booked with [name], MD  Confirmed with patient if condition begins to worsen call PCP or go to the ER.  Patient was given the office number and encouraged to call back with question or concerns.  : yes

## 2015-10-10 NOTE — Telephone Encounter (Signed)
Transitional care call attempted.  Left message for patient to return call. 

## 2015-10-11 ENCOUNTER — Ambulatory Visit: Payer: Medicare Other | Admitting: Internal Medicine

## 2015-10-11 ENCOUNTER — Encounter: Payer: Self-pay | Admitting: Internal Medicine

## 2015-10-11 ENCOUNTER — Ambulatory Visit (INDEPENDENT_AMBULATORY_CARE_PROVIDER_SITE_OTHER): Payer: Medicare Other | Admitting: Internal Medicine

## 2015-10-11 VITALS — BP 110/70 | HR 57 | Temp 97.4°F | Wt 178.0 lb

## 2015-10-11 DIAGNOSIS — I5042 Chronic combined systolic (congestive) and diastolic (congestive) heart failure: Secondary | ICD-10-CM

## 2015-10-11 DIAGNOSIS — F39 Unspecified mood [affective] disorder: Secondary | ICD-10-CM

## 2015-10-11 DIAGNOSIS — J9601 Acute respiratory failure with hypoxia: Secondary | ICD-10-CM | POA: Diagnosis not present

## 2015-10-11 DIAGNOSIS — J439 Emphysema, unspecified: Secondary | ICD-10-CM | POA: Diagnosis not present

## 2015-10-11 NOTE — Progress Notes (Signed)
Pre visit review using our clinic review tool, if applicable. No additional management support is needed unless otherwise documented below in the visit note. 

## 2015-10-11 NOTE — Assessment & Plan Note (Signed)
Doing better now Finishing out the prednisone  Still using the nebs

## 2015-10-11 NOTE — Progress Notes (Signed)
Subjective:    Patient ID: Haley Harris, female    DOB: December 19, 1928, 82 y.o.   MRN: [id number]  HPI Here for hospital follow up Daughter is here  Reviewed hospital records Admitted with acute respiratory failure ---- flu (?pneumonia from this) Exacerbation of CHF COPD also In for almost 2 weeks!  Did start a rehab program while there Will be starting home program with [facility] as well---not able to bathe alone yet  Breathing is better Doing her nebulizers bid usually--this really helps No fever Occasional dry cough  No chest pain No palpitations Slight dizziness but no syncope---has had to stop when doing things like getting dressed Walks with rollator Some edema in evening mostly --not bad. Had been very swollen at times in hospital (gained 9# from fluids and prednisone) Still on tapering prednisone dose  Mood has been okay Mild depressed mood with the hospital stay, etc  Continues on the med  Current Outpatient Prescriptions on File Prior to Visit  Medication Sig Dispense Refill  . acetaminophen (TYLENOL) 325 MG tablet Take 2 tablets (650 mg total) by mouth every 4 (four) hours as needed for headache or mild pain.    Marland Kitchen albuterol (PROVENTIL HFA;VENTOLIN HFA) 108 (90 Base) MCG/ACT inhaler Inhale 2 puffs into the lungs every 6 (six) hours as needed for wheezing or shortness of breath. 1 Inhaler 0  . albuterol (PROVENTIL) (2.5 MG/3ML) 0.083% nebulizer solution Take 3 mLs (2.5 mg total) by nebulization every 6 (six) hours as needed for wheezing or shortness of breath. 540 mL 3  . aspirin 81 MG tablet Take 81 mg by mouth daily after lunch.     . Calcium Carbonate-Vitamin D (CALTRATE 600+D PO) Take 1 tablet by mouth daily after lunch.     . carvedilol (COREG) 12.5 MG tablet Take 1 tablet (12.5 mg total) by mouth 2 (two) times daily with a meal. (Patient taking differently: Take 6.25 mg by mouth 2 (two) times daily with a meal. ) 180 tablet 1  . cetirizine (ZYRTEC) 10 MG tablet  Take 10 mg by mouth daily. For allergies    . denosumab (PROLIA) 60 MG/ML SOLN injection Inject 60 mg into the skin every 6 (six) months. Administer in upper arm, thigh, or abdomen    . escitalopram (LEXAPRO) 10 MG tablet Take 1 tablet by mouth  daily 90 tablet 3  . famotidine (PEPCID) 20 MG tablet Take 1 tablet (20 mg total) by mouth 2 (two) times daily. 180 tablet 1  . furosemide (LASIX) 40 MG tablet Take 1 tablet (40 mg total) by mouth 2 (two) times daily. 60 tablet 0  . gabapentin (NEURONTIN) 100 MG capsule Take 1 capsule (100 mg total) by mouth 2 (two) times daily. 180 capsule 3  . losartan (COZAAR) 50 MG tablet TAKE 1 TABLET BY MOUTH DAILY 90 tablet 1  . montelukast (SINGULAIR) 10 MG tablet TAKE 1 TABLET BY MOUTH DAILY 90 tablet 1  . Multiple Vitamin (MULITIVITAMIN WITH MINERALS) TABS Take 1 tablet by mouth daily after lunch.     . oxyCODONE-acetaminophen (PERCOCET/ROXICET) 5-325 MG per tablet Take 1-2 tablets by mouth every 4 (four) hours as needed. 30 tablet 0  . potassium chloride SA (K-DUR,KLOR-CON) 20 MEQ tablet Take 1 tablet (20 mEq total) by mouth 2 (two) times daily. 60 tablet 0  . pravastatin (PRAVACHOL) 80 MG tablet Take 1 tablet by mouth  daily 90 tablet 3  . predniSONE (DELTASONE) 10 MG tablet Take 4 tablets (40 mg) daily for  2 days, then, Take 3 tablets (30 mg) daily for 2 days, then, Take 2 tablets (20 mg) daily for 2 days, then, Take 1 tablets (10 mg) daily for 1 days, then stop 19 tablet 0  . spironolactone (ALDACTONE) 25 MG tablet Take 1 tablet by mouth  daily 90 tablet 3  . vitamin B-12 (CYANOCOBALAMIN) 500 MCG tablet Take 1,000 mcg by mouth 3 (three) times a week. Mon, Wed, Fri     No current facility-administered medications on file prior to visit.    Allergies  Allergen Reactions  . Amoxicillin-Pot Clavulanate Anaphylaxis  . Cephalexin Other (See Comments)    Reaction unknown  . Clarithromycin Other (See Comments)    Reaction unknown  . Lidocaine   .  Nifedipine Other (See Comments)    Reaction unknown  . Nsaids Other (See Comments)    Heart issue  . Olmesartan Medoxomil     REACTION: cough;  But tolerating Losartan 02/2014  . Penicillins   . Tramadol Hcl Other (See Comments)    Reaction unknown    Past Medical History  Diagnosis Date  . Allergic rhinitis, cause unspecified   . Unspecified cardiovascular disease   . Personal history of malignant neoplasm of breast   . Occlusion and stenosis of carotid artery without mention of cerebral infarction   . Chronic airway obstruction, not elsewhere classified   . Depressive disorder, not elsewhere classified   . Other dyspnea and respiratory abnormality   . Esophageal reflux   . Unspecified hearing loss   . Other and unspecified hyperlipidemia   . Unspecified essential hypertension   . Osteoporosis, unspecified   . Peripheral vascular disease, unspecified ([place])   . Unspecified urinary incontinence   . Spinal stenosis   . ACE-inhibitor cough   . Angina   . Heart murmur     aS CHILD  . Shortness of breath   . Recurrent upper respiratory infection (URI)   . Anxiety   . Pneumonia   . 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 Myocardial infarct ([place])   . Osteoarthrosis, unspecified whether generalized or localized, unspecified site   . Malignant neoplasm of breast (female), unspecified site   . Compression fracture of T12 vertebra ([place]) 10/28/2011  . CHF (congestive heart failure) [facility])     Past Surgical History  Procedure Laterality Date  . Mastectomy  1987    Right  . Breast reconstruction  1998    Reconstruction   . Reduction mammaplasty  1998    Left  . Mastoidectomy  childhood  . Appendectomy  1948  . Lumbar laminectomy  2003  . Shoulder surgery  02/2005    Bilateral fractures with multiple surgeries  . Breast implant removal  06/12/09    right  . Abdominal hysterectomy    . Fracture surgery      fracture right elbow  . Coronary angioplasty   11/12    distal RCA  . Mastectomy    . Left heart catheterization with coronary angiogram N/A 06/05/2011    Procedure: LEFT HEART CATHETERIZATION WITH CORONARY ANGIOGRAM;  Surgeon: Clent Demark, MD;  [facility] C[facility] LAB;  Service: Cardiovascular;  Laterality: N/A;  . Left heart catheterization with coronary angiogram N/A 03/05/2014    Procedure: LEFT HEART CATHETERIZATION WITH CORONARY ANGIOGRAM;  Surgeon: Clent Demark, MD;  Location[facility] C[facility] LAB;  Service: Cardiovascular;  Laterality: N/A;  . Flexible sigmoidoscopy N/A 01/21/2015    Procedure: FLEXIBLE SIGMOIDOSCOPY;  Surgeon:  Richmond Campbell, MD;  Location: [facility] ENDOSCOPY;  Service: Endoscopy;  Laterality: N/A;  . Flexible sigmoidoscopy  01/22/2015       . Cardiac catheterization N/A 01/25/2015    Procedure: Left Heart Cath and Coronary Angiography;  Surgeon: Charolette Forward, MD;  Location: [place] CV LAB;  Service: Cardiovascular;  Laterality: N/A;    Family History  Problem Relation Age of Onset  . Heart attack Father 30  . Cancer Mother     ovarian  . Cancer Brother     lung  . Arthritis      family    Social History   Social History  . Marital Status: Widowed    Spouse Name: N/A  . Number of Children: 2  . Years of Education: N/A   Occupational History  . 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     Social History Main Topics  . Smoking status: Former Smoker    Quit date: 07/13/1974  . Smokeless tobacco: Never Used  . Alcohol Use: 0.0 oz/week    0 Standard drinks or equivalent per week     Comment: occasional  . Drug Use: No  . Sexual Activity: No   Other Topics Concern  . Not on file   Social History Narrative   Has living will   Son or daughter would be health care POA.   Requests DNR--written 03/02/13   No tube feeds if cognitively unaware   Review of Systems Not sleeping great--taking second dose of lasix late and keeping her up. Discussed taking second dose ~1PM Does use double pillows in  bed No PND    Objective:   Physical Exam  Constitutional: She appears well-developed. No distress.  Neck: Normal range of motion. Neck supple. No thyromegaly present.  Cardiovascular: Normal rate, regular rhythm and normal heart sounds.  Exam reveals no gallop.   No murmur heard. Pulmonary/Chest: Effort normal. No respiratory distress. She has no wheezes. She has no rales.  Slightly decreased breath sounds but clear  Musculoskeletal:  At most trace of edema  Lymphadenopathy:    She has no cervical adenopathy.          Assessment & Plan:

## 2015-10-11 NOTE — Assessment & Plan Note (Signed)
Seems to be compensated Will have her cut back to once a day with the furosemide

## 2015-10-11 NOTE — Assessment & Plan Note (Signed)
Generally mood has been okay

## 2015-10-11 NOTE — Assessment & Plan Note (Signed)
Probably initiated by the flu--then ?secondary infection and COPD exacerbation CHF from the fluids and prednisone Doing better Off oxygen now

## 2015-10-14 ENCOUNTER — Other Ambulatory Visit: Payer: Self-pay | Admitting: *Deleted

## 2015-10-14 ENCOUNTER — Ambulatory Visit: Payer: Self-pay | Admitting: *Deleted

## 2015-10-14 DIAGNOSIS — J441 Chronic obstructive pulmonary disease with (acute) exacerbation: Secondary | ICD-10-CM | POA: Diagnosis not present

## 2015-10-14 DIAGNOSIS — E278 Other specified disorders of adrenal gland: Secondary | ICD-10-CM | POA: Diagnosis not present

## 2015-10-14 DIAGNOSIS — G629 Polyneuropathy, unspecified: Secondary | ICD-10-CM | POA: Diagnosis not present

## 2015-10-14 DIAGNOSIS — E871 Hypo-osmolality and hyponatremia: Secondary | ICD-10-CM | POA: Diagnosis not present

## 2015-10-14 DIAGNOSIS — N183 Chronic kidney disease, stage 3 (moderate): Secondary | ICD-10-CM | POA: Diagnosis not present

## 2015-10-14 DIAGNOSIS — I2510 Atherosclerotic heart disease of native coronary artery without angina pectoris: Secondary | ICD-10-CM | POA: Diagnosis not present

## 2015-10-14 DIAGNOSIS — I739 Peripheral vascular disease, unspecified: Secondary | ICD-10-CM | POA: Diagnosis not present

## 2015-10-14 DIAGNOSIS — J209 Acute bronchitis, unspecified: Secondary | ICD-10-CM | POA: Diagnosis not present

## 2015-10-14 DIAGNOSIS — I5043 Acute on chronic combined systolic (congestive) and diastolic (congestive) heart failure: Secondary | ICD-10-CM | POA: Diagnosis not present

## 2015-10-14 DIAGNOSIS — M4800 Spinal stenosis, site unspecified: Secondary | ICD-10-CM | POA: Diagnosis not present

## 2015-10-14 DIAGNOSIS — J440 Chronic obstructive pulmonary disease with acute lower respiratory infection: Secondary | ICD-10-CM | POA: Diagnosis not present

## 2015-10-14 DIAGNOSIS — I130 Hypertensive heart and chronic kidney disease with heart failure and stage 1 through stage 4 chronic kidney disease, or unspecified chronic kidney disease: Secondary | ICD-10-CM | POA: Diagnosis not present

## 2015-10-14 NOTE — Patient Outreach (Signed)
[place] [facility]) Care Management  10/14/2015  LASHANNON TORTORICH Nov 03, 1928 [id number]   Patient triggered RED on EMMI Pneumonia dashboard, notification sent to [name], RN.  Thanks, [name]. [place], [place] Assistant Phone: [phone] Fax: [phone]

## 2015-10-14 NOTE — Patient Outreach (Signed)
[place] [facility]) Care Management [place], Telephone outreach, Transition of Care week 2 10/14/2015  EMUNA NINI 25-Jan-1929 [id number]  Successful telephone outreach to Ms. Dara Lords, referred to [place] for transition of care after recent IP hospital visit March 17-29, 2017 for ARF with hypoxia related to flu and CHF. HIPPA verified.   Ms. Junius had EMMI red flag trigger related to her medication, and overall well being after her discharge home.  Ms. Muffley verified that she had been taking her medications as prescribed, and voiced that she had no questions or concerns related to her medications.  Ms. Dippold reported today that she is "overall feeling better, but had a couple of bad days," related to dizziness she is experiencing, which she reported is "not new."  Ms. Borman reported that she is "just taking it easy, and moving slowly and carefully," using her walker to steady her.    Mrs. Karch confirmed that she heard back from [facility] services ordered post-discharge for RN and PT, and that "they are coming here today."  Ms. Norrick indicated that she would like to re-schedule our home visit, which was originally scheduled for this afternoon, "because I don't want to over-do and wear out, with 2 visits in one day."  Ms. Luan also stated that she attended her scheduled visit scheduled to her PCP and that "it went well."  Ms. Mccafferty reports that she has another appointment to follow up with her PCP on October 30, 2015.  Ms. Mclanahan reported that she has "great family support," and states that she lives with her daughter who "takes very good care of me and makes sure I have what I need."  Ms. Mojica agreed to reschedule the home visit with this [place] next week, and we made that appointment for Tuesday visit for Monday, October 22, 2015 at 1:00 pm. I made sure Ms. Blatter had my contact information in case she wished to speak with me before our scheduled home visit  on Monday.  Plan: Ms. Behnken will keep all provider appointments. Ms. Mole will continue taking her medications as they are prescribed post-discharge. Ms. Biondolillo will work with [facility] services as ordered post-discharge from recent IP hospital visit. [place] home visit planned for Monday, October 22, 2015 at 1:00 pm.  Oneta Rack, RN, BSN, [place] Care Management  [phone]

## 2015-10-16 DIAGNOSIS — E871 Hypo-osmolality and hyponatremia: Secondary | ICD-10-CM | POA: Diagnosis not present

## 2015-10-16 DIAGNOSIS — J440 Chronic obstructive pulmonary disease with acute lower respiratory infection: Secondary | ICD-10-CM | POA: Diagnosis not present

## 2015-10-16 DIAGNOSIS — E278 Other specified disorders of adrenal gland: Secondary | ICD-10-CM | POA: Diagnosis not present

## 2015-10-16 DIAGNOSIS — M4800 Spinal stenosis, site unspecified: Secondary | ICD-10-CM | POA: Diagnosis not present

## 2015-10-16 DIAGNOSIS — I5043 Acute on chronic combined systolic (congestive) and diastolic (congestive) heart failure: Secondary | ICD-10-CM | POA: Diagnosis not present

## 2015-10-16 DIAGNOSIS — I739 Peripheral vascular disease, unspecified: Secondary | ICD-10-CM | POA: Diagnosis not present

## 2015-10-16 DIAGNOSIS — J209 Acute bronchitis, unspecified: Secondary | ICD-10-CM | POA: Diagnosis not present

## 2015-10-16 DIAGNOSIS — I130 Hypertensive heart and chronic kidney disease with heart failure and stage 1 through stage 4 chronic kidney disease, or unspecified chronic kidney disease: Secondary | ICD-10-CM | POA: Diagnosis not present

## 2015-10-16 DIAGNOSIS — I2510 Atherosclerotic heart disease of native coronary artery without angina pectoris: Secondary | ICD-10-CM | POA: Diagnosis not present

## 2015-10-16 DIAGNOSIS — J441 Chronic obstructive pulmonary disease with (acute) exacerbation: Secondary | ICD-10-CM | POA: Diagnosis not present

## 2015-10-16 DIAGNOSIS — G629 Polyneuropathy, unspecified: Secondary | ICD-10-CM | POA: Diagnosis not present

## 2015-10-16 DIAGNOSIS — N183 Chronic kidney disease, stage 3 (moderate): Secondary | ICD-10-CM | POA: Diagnosis not present

## 2015-10-17 ENCOUNTER — Telehealth: Payer: Self-pay

## 2015-10-17 ENCOUNTER — Other Ambulatory Visit: Payer: Self-pay | Admitting: *Deleted

## 2015-10-17 NOTE — Telephone Encounter (Signed)
That is okay.

## 2015-10-17 NOTE — Patient Outreach (Signed)
[facility]aley Harris [facility]) Care Management  10/17/2015  Haley Harris May 16, 1929 [id number]   Patient triggered RED on EMMI Pneumonia dashboard, notification sent to [name], RN.  Thanks, [name]. [place], [name] Assistant Phone: [phone] Fax: [phone]

## 2015-10-17 NOTE — Telephone Encounter (Signed)
Left detailed message on voice mail, since it verified the number we were given, with the verbal orders

## 2015-10-17 NOTE — Patient Outreach (Signed)
[place] [facility]) Care Management  10/17/2015  ESRAA BRAUTIGAM [birth year] [id number]  Two telephone attempts made today, with second call successful telephone outreach to Ms. Alazay Wandell, an 82 y/o female referred to [place] for transition of care after recent IP hospital visit March 17-29, 2017 for ARF with hypoxia related to flu and CHF. HIPPA verified.   Ms. Gorra had EMMI red flag trigger related to her overall well being after her discharge home, indicating that she was having diarrhea, confusion, and swelling/ weight changes. Ms. Pieczynski verified that she is doing well, stating that she had intermittent diarrhea while she was on prednisone; Ms. Ulery reported that the diarrhea has now resolved.  Ms. Hamman acknowledged that she does have some swelling in her ankles, but stated that it "wasn't that bad," and added that she has a scheduled visit with her heart doctor tomorrow.  Ms. Morici stated that she was not concerned about the ankle swelling and would make sure it was addressed during her provider appointment tomorrow.  Ms. Faunce stated that her [facility] sessions with PT and the RN were all going well, that she had been taking her medications as prescribed, and voiced that she had no additional questions or concerns.  We confirmed our scheduled appointment for a home visit for next week.  Ms. Crisafulli confirmed that she had my contact information should she wish to contact me prior to our home visit next week.  Plan: Ms. Potts will continue taking her medications as they are prescribed. Ms. Sendelbach will attend all of her scheduled provider appointments. Ms. Rubley will continue working with [facility] RN and PT as ordered. [place] in-home visit scheduled for next week.  Oneta Rack, RN, BSN, [employer] [facility] Care Management  [phone]   .

## 2015-10-17 NOTE — Telephone Encounter (Signed)
[place] PT with [facility] left v/m requesting verbal order for home health PT 2 x a week for 8 weeks for strengthening, balance, cardiopulmonary therapy and safety.Please advise.

## 2015-10-18 ENCOUNTER — Ambulatory Visit: Payer: Self-pay | Admitting: Family Medicine

## 2015-10-18 ENCOUNTER — Other Ambulatory Visit: Payer: Self-pay | Admitting: *Deleted

## 2015-10-18 DIAGNOSIS — I119 Hypertensive heart disease without heart failure: Secondary | ICD-10-CM | POA: Diagnosis not present

## 2015-10-18 DIAGNOSIS — E785 Hyperlipidemia, unspecified: Secondary | ICD-10-CM | POA: Diagnosis not present

## 2015-10-18 DIAGNOSIS — J209 Acute bronchitis, unspecified: Secondary | ICD-10-CM | POA: Diagnosis not present

## 2015-10-18 DIAGNOSIS — N183 Chronic kidney disease, stage 3 (moderate): Secondary | ICD-10-CM | POA: Diagnosis not present

## 2015-10-18 DIAGNOSIS — I2510 Atherosclerotic heart disease of native coronary artery without angina pectoris: Secondary | ICD-10-CM | POA: Diagnosis not present

## 2015-10-18 DIAGNOSIS — I252 Old myocardial infarction: Secondary | ICD-10-CM | POA: Diagnosis not present

## 2015-10-18 DIAGNOSIS — E871 Hypo-osmolality and hyponatremia: Secondary | ICD-10-CM | POA: Diagnosis not present

## 2015-10-18 DIAGNOSIS — I5043 Acute on chronic combined systolic (congestive) and diastolic (congestive) heart failure: Secondary | ICD-10-CM | POA: Diagnosis not present

## 2015-10-18 DIAGNOSIS — I739 Peripheral vascular disease, unspecified: Secondary | ICD-10-CM | POA: Diagnosis not present

## 2015-10-18 DIAGNOSIS — G629 Polyneuropathy, unspecified: Secondary | ICD-10-CM | POA: Diagnosis not present

## 2015-10-18 DIAGNOSIS — M4800 Spinal stenosis, site unspecified: Secondary | ICD-10-CM | POA: Diagnosis not present

## 2015-10-18 DIAGNOSIS — E278 Other specified disorders of adrenal gland: Secondary | ICD-10-CM | POA: Diagnosis not present

## 2015-10-18 DIAGNOSIS — D649 Anemia, unspecified: Secondary | ICD-10-CM | POA: Diagnosis not present

## 2015-10-18 DIAGNOSIS — J440 Chronic obstructive pulmonary disease with acute lower respiratory infection: Secondary | ICD-10-CM | POA: Diagnosis not present

## 2015-10-18 DIAGNOSIS — I130 Hypertensive heart and chronic kidney disease with heart failure and stage 1 through stage 4 chronic kidney disease, or unspecified chronic kidney disease: Secondary | ICD-10-CM | POA: Diagnosis not present

## 2015-10-18 DIAGNOSIS — J441 Chronic obstructive pulmonary disease with (acute) exacerbation: Secondary | ICD-10-CM | POA: Diagnosis not present

## 2015-10-18 NOTE — Patient Outreach (Signed)
[facility]) Care Management  10/18/2015  Haley Harris [birth year] [id number]  Unsuccessful telephone outreach to Ms. Haley Harris, an 82 y/o female referred to [place] for transition of care after recent IP hospital visit March 17-29, 2017 for ARF with hypoxia related to flu and CHF.   Haley Harris had another EMMI automated phone call red flag trigger related to her overall well being after her discharge home, indicating that she was having diarrhea, confusion, and swelling/ weight changes. I had spoken to Haley Harris yesterday after she triggered red flag alerts from [facility] automated telephone call, and she verified that she is doing well, stating that she had intermittent diarrhea while she was on prednisone; Haley Harris reported yesterday that the diarrhea has now resolved. Yesterday, Haley Harris acknowledged that she does have some swelling in her ankles, but stated that it "wasn't that bad," and added that she has a scheduled visit with her heart doctor scheduled for today.   I left Haley Harris a HIPPA compliant VM msg with my contact information and asked her to return my call if she has experienced any problems or issues since our conversation yesterday, or had any questions prior to our home visit next week.  Plan: [place] in-home visit scheduled for next week.  Oneta Rack, RN, BSN, [employer] [facility] Care Management  [phone]

## 2015-10-21 ENCOUNTER — Other Ambulatory Visit: Payer: Self-pay | Admitting: *Deleted

## 2015-10-21 DIAGNOSIS — E278 Other specified disorders of adrenal gland: Secondary | ICD-10-CM | POA: Diagnosis not present

## 2015-10-21 DIAGNOSIS — J209 Acute bronchitis, unspecified: Secondary | ICD-10-CM | POA: Diagnosis not present

## 2015-10-21 DIAGNOSIS — J440 Chronic obstructive pulmonary disease with acute lower respiratory infection: Secondary | ICD-10-CM | POA: Diagnosis not present

## 2015-10-21 DIAGNOSIS — I2510 Atherosclerotic heart disease of native coronary artery without angina pectoris: Secondary | ICD-10-CM | POA: Diagnosis not present

## 2015-10-21 DIAGNOSIS — E871 Hypo-osmolality and hyponatremia: Secondary | ICD-10-CM | POA: Diagnosis not present

## 2015-10-21 DIAGNOSIS — M79632 Pain in left forearm: Secondary | ICD-10-CM | POA: Diagnosis not present

## 2015-10-21 DIAGNOSIS — I739 Peripheral vascular disease, unspecified: Secondary | ICD-10-CM | POA: Diagnosis not present

## 2015-10-21 DIAGNOSIS — J441 Chronic obstructive pulmonary disease with (acute) exacerbation: Secondary | ICD-10-CM | POA: Diagnosis not present

## 2015-10-21 DIAGNOSIS — I130 Hypertensive heart and chronic kidney disease with heart failure and stage 1 through stage 4 chronic kidney disease, or unspecified chronic kidney disease: Secondary | ICD-10-CM | POA: Diagnosis not present

## 2015-10-21 DIAGNOSIS — M4800 Spinal stenosis, site unspecified: Secondary | ICD-10-CM | POA: Diagnosis not present

## 2015-10-21 DIAGNOSIS — N183 Chronic kidney disease, stage 3 (moderate): Secondary | ICD-10-CM | POA: Diagnosis not present

## 2015-10-21 DIAGNOSIS — G629 Polyneuropathy, unspecified: Secondary | ICD-10-CM | POA: Diagnosis not present

## 2015-10-21 DIAGNOSIS — M25522 Pain in left elbow: Secondary | ICD-10-CM | POA: Diagnosis not present

## 2015-10-21 DIAGNOSIS — I5043 Acute on chronic combined systolic (congestive) and diastolic (congestive) heart failure: Secondary | ICD-10-CM | POA: Diagnosis not present

## 2015-10-21 NOTE — Patient Outreach (Signed)
[facility]) Care Management  10/21/2015  JULANA CAMBRIDGE [birth year] [id number]   Successful telephone outreach to Ms. Ashwini Ta, an 82 y/o female referred to [place] for transition of care after recent IP hospital visit March 17-29, 2017 for ARF with hypoxia related to flu and CHF. HIPPA verified.   Ms. Vitrano had EMMI red flag trigger related to her overall well being after her discharge home, indicating that she was having swelling and had not returned to her usual pre-hospital activity level. Ms. Waner verified that she is doing well, stating that she has had no problems other than a new infection to her elbow, where she "broke it 30 years ago."  Ms. Wondra said that she noticed a little scab on her elbow over the weekend, and she went to see her orthopedist about this today.  Ms. Escovar stated that she was given an antibiotic.    Ms. Murrie again stated that her [facility] sessions with PT and the RN were going well, that she had been taking her medications as prescribed, and voiced that she had no additional questions or concerns. We confirmed our scheduled appointment for an in-home visit tomorrow.   Plan: Ms. Helfgott will continue taking her medications as they are prescribed. Ms. Sundby will attend all of her scheduled provider appointments. Ms. Sleet will continue working with [facility] RN and PT as ordered. [facility] in-home visit scheduled for tomorrow.  Oneta Rack, RN, BSN, [employer] [facility] Care Management  [phone]

## 2015-10-21 NOTE — Patient Outreach (Signed)
[facility]) Care Management  10/21/2015  KIMBERL RAISNER 31-Oct-1928 [id number]   Patient triggered RED on EMMI Pneumonia Dashboard, notification sent to [name], RN.  Thanks, [name]. [place], [place] Assistant Phone: [phone] Fax: [phone]

## 2015-10-22 ENCOUNTER — Other Ambulatory Visit: Payer: Self-pay | Admitting: *Deleted

## 2015-10-22 ENCOUNTER — Telehealth: Payer: Self-pay

## 2015-10-22 ENCOUNTER — Encounter: Payer: Self-pay | Admitting: *Deleted

## 2015-10-22 DIAGNOSIS — E871 Hypo-osmolality and hyponatremia: Secondary | ICD-10-CM | POA: Diagnosis not present

## 2015-10-22 DIAGNOSIS — I739 Peripheral vascular disease, unspecified: Secondary | ICD-10-CM | POA: Diagnosis not present

## 2015-10-22 DIAGNOSIS — I5043 Acute on chronic combined systolic (congestive) and diastolic (congestive) heart failure: Secondary | ICD-10-CM | POA: Diagnosis not present

## 2015-10-22 DIAGNOSIS — N183 Chronic kidney disease, stage 3 (moderate): Secondary | ICD-10-CM | POA: Diagnosis not present

## 2015-10-22 DIAGNOSIS — I130 Hypertensive heart and chronic kidney disease with heart failure and stage 1 through stage 4 chronic kidney disease, or unspecified chronic kidney disease: Secondary | ICD-10-CM | POA: Diagnosis not present

## 2015-10-22 DIAGNOSIS — J441 Chronic obstructive pulmonary disease with (acute) exacerbation: Secondary | ICD-10-CM | POA: Diagnosis not present

## 2015-10-22 DIAGNOSIS — J209 Acute bronchitis, unspecified: Secondary | ICD-10-CM | POA: Diagnosis not present

## 2015-10-22 DIAGNOSIS — E278 Other specified disorders of adrenal gland: Secondary | ICD-10-CM | POA: Diagnosis not present

## 2015-10-22 DIAGNOSIS — G629 Polyneuropathy, unspecified: Secondary | ICD-10-CM | POA: Diagnosis not present

## 2015-10-22 DIAGNOSIS — I2510 Atherosclerotic heart disease of native coronary artery without angina pectoris: Secondary | ICD-10-CM | POA: Diagnosis not present

## 2015-10-22 DIAGNOSIS — J440 Chronic obstructive pulmonary disease with acute lower respiratory infection: Secondary | ICD-10-CM | POA: Diagnosis not present

## 2015-10-22 DIAGNOSIS — M4800 Spinal stenosis, site unspecified: Secondary | ICD-10-CM | POA: Diagnosis not present

## 2015-10-22 NOTE — Telephone Encounter (Signed)
Pt left v/m; pt was discharged from hospital and had low K and pt presently taking K twice a day; pt has infection in bursa of elbow and pt is taking Sulfa bid for 10 days. Pt started Sulfa on 10/21/15; paperwork with Sulfa said Sulfa can raise K. Pt wants to know if pt should continue taking K twice a day while taking Sulfa or should K be decreased until finishes Sulfa. Pt request cb.

## 2015-10-22 NOTE — Telephone Encounter (Signed)
Please let her know that her recent potassium was still on the low side---so she doesn't have to decrease the potassium (I don't think the sulfa has a big effect on the potassium)

## 2015-10-22 NOTE — Patient Outreach (Signed)
[facility]) Care Management   10/22/2015  Haley Harris 03-Jul-1929 [id number]  Haley Harris is an 82 y.o. female an 82 y/o female referred to [facility] for transition of care after recent IP hospital visit March 17-29, 2017 for ARF with hypoxia related to flu and CHF.  [name] reports that she has been doing well after her recent hospitalization, which she attributes to the care provided by her daughter Haley Harris, whom she lives with.  [name] reports that she has attended all provider appointments as scheduled, is taking her medications as prescribed, and is working with [facility] RN and PT services as ordered post-hospital discharge.  [name] reports that she has excellent family support, both with her daughter Haley Harris and extended family members that do not live in their home, stating that "they all work together to make sure I have everything I need."  [name] states that she feels her biggest problem now is decreased mobility secondary to her recent illness and hospitalization, stating that she would like to feel stronger and increase her mobility.  She is committed to continuing to work with [facility] PT, through [place], which is scheduled twice a week "for at least another 4 or 5 weeks."  [name] also voices that she would like to have better mobility so that she "doesn't get so short of breath" when she walks.  [name] reports that she and her daughter call her medical providers any time they have a question or concern about her health care, and cite examples of calling [name] orthopedic provider over the weekend about an area on her (L) elbow where she had previous surgery "30 years ago;" they immediately made an appointment and [name] was given antibiotics for the area, as she reported the orthopedist suspected was becoming infected.  [name] stated that she called her PCP this morning for clarification about taking the antibiotic along with her  potassium, as she was concerned the two medications "might cause my potassium to get too high."  While I was there during the home visit this afternoon, the doctor returned her call and instructed [name] to continue taking her potassium along with the antibiotic.  [name] also reports that she monitors and records her weights, SaO2 levels, and BP's every day.  [name] voiced a request that the EMMI automated call she has received post-hospital discharge be stopped, as she states, "I just can't answer the questions the way they want me to.... My answers require more than just a 'yes' or no,' and I will call my doctor if I am having problems."  Subjective: "It's just as easy to learn good habits as it is to learn bad ones, and I am trying to learn as many good habits as I can."  Objective:    BP 112/66 mmHg  Pulse 75  Resp 14  Ht 1.676 m (5\' 6" )  Wt 175 lb (79.379 kg)  BMI 28.26 kg/m2  SpO2 93%   Review of Systems  Constitutional:       Patient reports weakness and increased difficulty with mobility after recent illness and hospitalization.  Respiratory: Positive for shortness of breath. Negative for cough.        Pt. reports shortness of breath at times with activity; no shortness of breath noted this afternoon  Cardiovascular: Positive for leg swelling. Negative for chest pain.       Pt. has +2 bilateral pedal edema  noted in ankles/ feet; pt. wears compression hoses daily for her swelling  Gastrointestinal: Negative.   Genitourinary: Negative.   Musculoskeletal: Positive for myalgias and joint pain.       Patient states that she has osteoporosis, spinal stenosis, and experiences chronic back and knee pain, which she reports are all at a "3/10" this afternoon.  Skin: Negative.   Neurological: Positive for weakness.  Psychiatric/Behavioral: The patient is not nervous/anxious.        Patient reports that she feels depressed "at times," but states that this is controlled with her  faith, her medications and excellent family support systems.    Physical Exam  Constitutional: She is oriented to person, place, and time. She appears well-developed and well-nourished.  Cardiovascular: Normal rate, regular rhythm, normal heart sounds and intact distal pulses.   Respiratory: Effort normal and breath sounds normal. No respiratory distress. She has no wheezes. She has no rales.  GI: Soft. Bowel sounds are normal.  Musculoskeletal: She exhibits edema.  +2 peripheral edema noted bilaterally in ankles/ feet, as noted in ROS  Neurological: She is alert and oriented to person, place, and time.  Skin: Skin is warm and dry.  Patient's (L) elbow is covered with a clean, dry bandage; she reports that she noticed a scab and soreness to her elbow over the weekend, and went to the doctor, who put her on antibiotics  Psychiatric: She has a normal mood and affect. Her behavior is normal. Judgment and thought content normal.    Encounter Medications:   Outpatient Encounter Prescriptions as of 10/22/2015  Medication Sig  . acetaminophen (TYLENOL) 325 MG tablet Take 2 tablets (650 mg total) by mouth every 4 (four) hours as needed for headache or mild pain.  Marland Kitchen albuterol (PROVENTIL HFA;VENTOLIN HFA) 108 (90 Base) MCG/ACT inhaler Inhale 2 puffs into the lungs every 6 (six) hours as needed for wheezing or shortness of breath.  Marland Kitchen albuterol (PROVENTIL) (2.5 MG/3ML) 0.083% nebulizer solution Take 3 mLs (2.5 mg total) by nebulization every 6 (six) hours as needed for wheezing or shortness of breath.  Marland Kitchen aspirin 81 MG tablet Take 81 mg by mouth daily after lunch.   . Calcium Carbonate-Vitamin D (CALTRATE 600+D PO) Take 1 tablet by mouth daily after lunch.   . carvedilol (COREG) 12.5 MG tablet Take 1 tablet (12.5 mg total) by mouth 2 (two) times daily with a meal. (Patient taking differently: Take 6.25 mg by mouth 2 (two) times daily with a meal. )  . cetirizine (ZYRTEC) 10 MG tablet Take 10 mg by mouth  daily. For allergies  . denosumab (PROLIA) 60 MG/ML SOLN injection Inject 60 mg into the skin every 6 (six) months. Administer in upper arm, thigh, or abdomen  . escitalopram (LEXAPRO) 10 MG tablet Take 1 tablet by mouth  daily  . famotidine (PEPCID) 20 MG tablet Take 1 tablet (20 mg total) by mouth 2 (two) times daily.  . furosemide (LASIX) 40 MG tablet Take 1 tablet (40 mg total) by mouth 2 (two) times daily.  Marland Kitchen gabapentin (NEURONTIN) 100 MG capsule Take 1 capsule (100 mg total) by mouth 2 (two) times daily.  Marland Kitchen losartan (COZAAR) 50 MG tablet TAKE 1 TABLET BY MOUTH DAILY  . montelukast (SINGULAIR) 10 MG tablet TAKE 1 TABLET BY MOUTH DAILY  . Multiple Vitamin (MULITIVITAMIN WITH MINERALS) TABS Take 1 tablet by mouth daily after lunch.   . oxyCODONE-acetaminophen (PERCOCET/ROXICET) 5-325 MG per tablet Take 1-2 tablets by mouth every 4 (four) hours  as needed.  . potassium chloride SA (K-DUR,KLOR-CON) 20 MEQ tablet Take 1 tablet (20 mEq total) by mouth 2 (two) times daily.  . pravastatin (PRAVACHOL) 80 MG tablet Take 1 tablet by mouth  daily  . predniSONE (DELTASONE) 10 MG tablet Take 4 tablets (40 mg) daily for 2 days, then, Take 3 tablets (30 mg) daily for 2 days, then, Take 2 tablets (20 mg) daily for 2 days, then, Take 1 tablets (10 mg) daily for 1 days, then stop  . spironolactone (ALDACTONE) 25 MG tablet Take 1 tablet by mouth  daily  . vitamin B-12 (CYANOCOBALAMIN) 500 MCG tablet Take 1,000 mcg by mouth 3 (three) times a week. Mon, Wed, Fri   No facility-administered encounter medications on file as of 10/22/2015.    Functional Status:   In your present state of health, do you have any difficulty performing the following activities: 09/27/2015 01/19/2015  Hearing? Tempie Donning  Vision? N N  Difficulty concentrating or making decisions? N N  Walking or climbing stairs? N Y  Dressing or bathing? N N  Doing errands, shopping? N N    Fall/Depression Screening:    PHQ 2/9 Scores 04/12/2015  03/02/2013  PHQ - 2 Score - 0  Exception Documentation Medical reason -    Assessment:  Ms. Petrov has excellent family support through both her daughter Haley Harris, whom she lives with, as well as with her extended family members that do not live with her.  Ms. Randt is committed to maintaining her established good self-health habits, and would like to become stronger and more mobile as she continues recuperating from her recent illness and hospitalization.  Plan:  Ms. Ewoldt will continue taking her medications as prescribed. Ms. Gotch will continue attending all provider appointments as scheduled. Ms. Gruwell will continue actively and consistently working with [facility] PT. Ms. Hartness will continue to monitor and record her daily weights, pulse oximetry readings, and BP's. [place] telephone outreach for transition of care week 4 planned for next week.  I appreciate the opportunity to participate in Ms. Simonin's care.   Oneta Rack, RN, BSN, [employer] [facility] Care Management  [phone]

## 2015-10-22 NOTE — Telephone Encounter (Signed)
Spoke to patient. She appreciated the call back

## 2015-10-23 ENCOUNTER — Encounter: Payer: Self-pay | Admitting: *Deleted

## 2015-10-23 DIAGNOSIS — E871 Hypo-osmolality and hyponatremia: Secondary | ICD-10-CM | POA: Diagnosis not present

## 2015-10-23 DIAGNOSIS — I2510 Atherosclerotic heart disease of native coronary artery without angina pectoris: Secondary | ICD-10-CM | POA: Diagnosis not present

## 2015-10-23 DIAGNOSIS — N183 Chronic kidney disease, stage 3 (moderate): Secondary | ICD-10-CM | POA: Diagnosis not present

## 2015-10-23 DIAGNOSIS — J440 Chronic obstructive pulmonary disease with acute lower respiratory infection: Secondary | ICD-10-CM | POA: Diagnosis not present

## 2015-10-23 DIAGNOSIS — I130 Hypertensive heart and chronic kidney disease with heart failure and stage 1 through stage 4 chronic kidney disease, or unspecified chronic kidney disease: Secondary | ICD-10-CM | POA: Diagnosis not present

## 2015-10-23 DIAGNOSIS — J209 Acute bronchitis, unspecified: Secondary | ICD-10-CM | POA: Diagnosis not present

## 2015-10-23 DIAGNOSIS — I5043 Acute on chronic combined systolic (congestive) and diastolic (congestive) heart failure: Secondary | ICD-10-CM | POA: Diagnosis not present

## 2015-10-23 DIAGNOSIS — I739 Peripheral vascular disease, unspecified: Secondary | ICD-10-CM | POA: Diagnosis not present

## 2015-10-23 DIAGNOSIS — E278 Other specified disorders of adrenal gland: Secondary | ICD-10-CM | POA: Diagnosis not present

## 2015-10-23 DIAGNOSIS — J441 Chronic obstructive pulmonary disease with (acute) exacerbation: Secondary | ICD-10-CM | POA: Diagnosis not present

## 2015-10-23 DIAGNOSIS — M4800 Spinal stenosis, site unspecified: Secondary | ICD-10-CM | POA: Diagnosis not present

## 2015-10-23 DIAGNOSIS — G629 Polyneuropathy, unspecified: Secondary | ICD-10-CM | POA: Diagnosis not present

## 2015-10-24 DIAGNOSIS — N183 Chronic kidney disease, stage 3 (moderate): Secondary | ICD-10-CM | POA: Diagnosis not present

## 2015-10-24 DIAGNOSIS — E871 Hypo-osmolality and hyponatremia: Secondary | ICD-10-CM | POA: Diagnosis not present

## 2015-10-24 DIAGNOSIS — I739 Peripheral vascular disease, unspecified: Secondary | ICD-10-CM | POA: Diagnosis not present

## 2015-10-24 DIAGNOSIS — I5043 Acute on chronic combined systolic (congestive) and diastolic (congestive) heart failure: Secondary | ICD-10-CM | POA: Diagnosis not present

## 2015-10-24 DIAGNOSIS — I2510 Atherosclerotic heart disease of native coronary artery without angina pectoris: Secondary | ICD-10-CM | POA: Diagnosis not present

## 2015-10-24 DIAGNOSIS — I130 Hypertensive heart and chronic kidney disease with heart failure and stage 1 through stage 4 chronic kidney disease, or unspecified chronic kidney disease: Secondary | ICD-10-CM | POA: Diagnosis not present

## 2015-10-24 DIAGNOSIS — E278 Other specified disorders of adrenal gland: Secondary | ICD-10-CM | POA: Diagnosis not present

## 2015-10-24 DIAGNOSIS — J209 Acute bronchitis, unspecified: Secondary | ICD-10-CM | POA: Diagnosis not present

## 2015-10-24 DIAGNOSIS — G629 Polyneuropathy, unspecified: Secondary | ICD-10-CM | POA: Diagnosis not present

## 2015-10-24 DIAGNOSIS — J441 Chronic obstructive pulmonary disease with (acute) exacerbation: Secondary | ICD-10-CM | POA: Diagnosis not present

## 2015-10-24 DIAGNOSIS — J440 Chronic obstructive pulmonary disease with acute lower respiratory infection: Secondary | ICD-10-CM | POA: Diagnosis not present

## 2015-10-24 DIAGNOSIS — M4800 Spinal stenosis, site unspecified: Secondary | ICD-10-CM | POA: Diagnosis not present

## 2015-10-26 ENCOUNTER — Emergency Department (HOSPITAL_COMMUNITY)
Admission: EM | Admit: 2015-10-26 | Discharge: 2015-10-26 | Disposition: A | Discharge status: Home / Self Care | Payer: Medicare Other | Attending: Emergency Medicine | Admitting: Emergency Medicine

## 2015-10-26 ENCOUNTER — Encounter (HOSPITAL_COMMUNITY): Payer: Self-pay | Admitting: Emergency Medicine

## 2015-10-26 ENCOUNTER — Emergency Department (HOSPITAL_COMMUNITY): Payer: Medicare Other

## 2015-10-26 DIAGNOSIS — I10 Essential (primary) hypertension: Secondary | ICD-10-CM | POA: Insufficient documentation

## 2015-10-26 DIAGNOSIS — E785 Hyperlipidemia, unspecified: Secondary | ICD-10-CM | POA: Diagnosis not present

## 2015-10-26 DIAGNOSIS — S0181XA Laceration without foreign body of other part of head, initial encounter: Secondary | ICD-10-CM | POA: Insufficient documentation

## 2015-10-26 DIAGNOSIS — S060X9A Concussion with loss of consciousness of unspecified duration, initial encounter: Secondary | ICD-10-CM | POA: Diagnosis not present

## 2015-10-26 DIAGNOSIS — Z23 Encounter for immunization: Secondary | ICD-10-CM | POA: Diagnosis not present

## 2015-10-26 DIAGNOSIS — Z79899 Other long term (current) drug therapy: Secondary | ICD-10-CM | POA: Diagnosis not present

## 2015-10-26 DIAGNOSIS — Y9389 Activity, other specified: Secondary | ICD-10-CM | POA: Insufficient documentation

## 2015-10-26 DIAGNOSIS — K219 Gastro-esophageal reflux disease without esophagitis: Secondary | ICD-10-CM | POA: Insufficient documentation

## 2015-10-26 DIAGNOSIS — F419 Anxiety disorder, unspecified: Secondary | ICD-10-CM | POA: Diagnosis not present

## 2015-10-26 DIAGNOSIS — Y998 Other external cause status: Secondary | ICD-10-CM | POA: Diagnosis not present

## 2015-10-26 DIAGNOSIS — S098XXA Other specified injuries of head, initial encounter: Secondary | ICD-10-CM | POA: Diagnosis not present

## 2015-10-26 DIAGNOSIS — I252 Old myocardial infarction: Secondary | ICD-10-CM | POA: Insufficient documentation

## 2015-10-26 DIAGNOSIS — M1990 Unspecified osteoarthritis, unspecified site: Secondary | ICD-10-CM | POA: Insufficient documentation

## 2015-10-26 DIAGNOSIS — J449 Chronic obstructive pulmonary disease, unspecified: Secondary | ICD-10-CM | POA: Diagnosis not present

## 2015-10-26 DIAGNOSIS — Z87891 Personal history of nicotine dependence: Secondary | ICD-10-CM | POA: Diagnosis not present

## 2015-10-26 DIAGNOSIS — I509 Heart failure, unspecified: Secondary | ICD-10-CM | POA: Insufficient documentation

## 2015-10-26 DIAGNOSIS — H9190 Unspecified hearing loss, unspecified ear: Secondary | ICD-10-CM | POA: Diagnosis not present

## 2015-10-26 DIAGNOSIS — Z792 Long term (current) use of antibiotics: Secondary | ICD-10-CM | POA: Insufficient documentation

## 2015-10-26 DIAGNOSIS — M810 Age-related osteoporosis without current pathological fracture: Secondary | ICD-10-CM | POA: Insufficient documentation

## 2015-10-26 DIAGNOSIS — S199XXA Unspecified injury of neck, initial encounter: Secondary | ICD-10-CM | POA: Insufficient documentation

## 2015-10-26 DIAGNOSIS — W01198A Fall on same level from slipping, tripping and stumbling with subsequent striking against other object, initial encounter: Secondary | ICD-10-CM | POA: Insufficient documentation

## 2015-10-26 DIAGNOSIS — R011 Cardiac murmur, unspecified: Secondary | ICD-10-CM | POA: Insufficient documentation

## 2015-10-26 DIAGNOSIS — I517 Cardiomegaly: Secondary | ICD-10-CM | POA: Diagnosis not present

## 2015-10-26 DIAGNOSIS — S0993XA Unspecified injury of face, initial encounter: Secondary | ICD-10-CM | POA: Diagnosis present

## 2015-10-26 DIAGNOSIS — F329 Major depressive disorder, single episode, unspecified: Secondary | ICD-10-CM | POA: Diagnosis not present

## 2015-10-26 DIAGNOSIS — I25119 Atherosclerotic heart disease of native coronary artery with unspecified angina pectoris: Secondary | ICD-10-CM | POA: Insufficient documentation

## 2015-10-26 DIAGNOSIS — M542 Cervicalgia: Secondary | ICD-10-CM | POA: Diagnosis not present

## 2015-10-26 DIAGNOSIS — Z880 Allergy status to penicillin: Secondary | ICD-10-CM | POA: Diagnosis not present

## 2015-10-26 DIAGNOSIS — E871 Hypo-osmolality and hyponatremia: Secondary | ICD-10-CM

## 2015-10-26 DIAGNOSIS — G709 Myoneural disorder, unspecified: Secondary | ICD-10-CM | POA: Insufficient documentation

## 2015-10-26 DIAGNOSIS — Y92002 Bathroom of unspecified non-institutional (private) residence single-family (private) house as the place of occurrence of the external cause: Secondary | ICD-10-CM | POA: Insufficient documentation

## 2015-10-26 DIAGNOSIS — Z9889 Other specified postprocedural states: Secondary | ICD-10-CM | POA: Diagnosis not present

## 2015-10-26 DIAGNOSIS — Z853 Personal history of malignant neoplasm of breast: Secondary | ICD-10-CM | POA: Insufficient documentation

## 2015-10-26 DIAGNOSIS — R0781 Pleurodynia: Secondary | ICD-10-CM | POA: Diagnosis not present

## 2015-10-26 DIAGNOSIS — R55 Syncope and collapse: Secondary | ICD-10-CM | POA: Diagnosis not present

## 2015-10-26 DIAGNOSIS — Z7982 Long term (current) use of aspirin: Secondary | ICD-10-CM | POA: Diagnosis not present

## 2015-10-26 LAB — COMPREHENSIVE METABOLIC PANEL
ALT: 20 U/L (ref 14–54)
AST: 21 U/L (ref 15–41)
Albumin: 3.4 g/dL — ABNORMAL LOW (ref 3.5–5.0)
Alkaline Phosphatase: 53 U/L (ref 38–126)
Anion gap: 12 (ref 5–15)
BILIRUBIN TOTAL: 0.9 mg/dL (ref 0.3–1.2)
BUN: 17 mg/dL (ref 6–20)
CALCIUM: 8.8 mg/dL — AB (ref 8.9–10.3)
CO2: 21 mmol/L — ABNORMAL LOW (ref 22–32)
CREATININE: 1.36 mg/dL — AB (ref 0.44–1.00)
Chloride: 92 mmol/L — ABNORMAL LOW (ref 101–111)
GFR, EST AFRICAN AMERICAN: 40 mL/min — AB (ref >60)
GFR, EST NON AFRICAN AMERICAN: 35 mL/min — AB (ref >60)
Glucose, Bld: 88 mg/dL (ref 65–99)
Potassium: 4.7 mmol/L (ref 3.5–5.1)
Sodium: 125 mmol/L — ABNORMAL LOW (ref 135–145)
TOTAL PROTEIN: 6.5 g/dL (ref 6.5–8.1)

## 2015-10-26 LAB — CBC WITH DIFFERENTIAL/PLATELET
Basophils Absolute: 0 10*3/uL (ref 0.0–0.1)
Basophils Relative: 0 %
EOS ABS: 0.2 10*3/uL (ref 0.0–0.7)
EOS PCT: 3 %
HCT: 27.4 % — ABNORMAL LOW (ref 36.0–46.0)
Hemoglobin: 9.6 g/dL — ABNORMAL LOW (ref 12.0–15.0)
LYMPHS ABS: 0.5 10*3/uL — AB (ref 0.7–4.0)
Lymphocytes Relative: 9 %
MCH: 33 pg (ref 26.0–34.0)
MCHC: 35 g/dL (ref 30.0–36.0)
MCV: 94.2 fL (ref 78.0–100.0)
MONO ABS: 0.5 10*3/uL (ref 0.1–1.0)
Monocytes Relative: 9 %
Neutro Abs: 4.5 10*3/uL (ref 1.7–7.7)
Neutrophils Relative %: 79 %
PLATELETS: 217 10*3/uL (ref 150–400)
RBC: 2.91 MIL/uL — AB (ref 3.87–5.11)
RDW: 12.9 % (ref 11.5–15.5)
WBC: 5.7 10*3/uL (ref 4.0–10.5)

## 2015-10-26 LAB — I-STAT TROPONIN, ED: TROPONIN I, POC: 0.01 ng/mL (ref 0.00–0.08)

## 2015-10-26 MED ORDER — SODIUM CHLORIDE 0.9 % IV BOLUS (SEPSIS)
250.0000 mL | Freq: Once | INTRAVENOUS | Status: AC
  Administered 2015-10-26: 250 mL via INTRAVENOUS

## 2015-10-26 MED ORDER — TETANUS-DIPHTH-ACELL PERTUSSIS 5-2.5-18.5 LF-MCG/0.5 IM SUSP
0.5000 mL | Freq: Once | INTRAMUSCULAR | Status: AC
  Administered 2015-10-26: 0.5 mL via INTRAMUSCULAR
  Filled 2015-10-26: qty 0.5

## 2015-10-26 MED ORDER — HYDROCODONE-ACETAMINOPHEN 5-325 MG PO TABS
1.0000 | ORAL_TABLET | Freq: Once | ORAL | Status: AC
  Administered 2015-10-26: 1 via ORAL
  Filled 2015-10-26: qty 1

## 2015-10-26 MED ORDER — LIDOCAINE-EPINEPHRINE (PF) 2 %-1:200000 IJ SOLN
10.0000 mL | Freq: Once | INTRAMUSCULAR | Status: AC
  Administered 2015-10-26: 10 mL
  Filled 2015-10-26: qty 20

## 2015-10-26 NOTE — ED Notes (Signed)
Pt got up to go to bathroom around 5:30  This morning. Pt felt herself falling off toilet- and woke up on the floor. Pt crawled out of the bathroom and her daughter came to help her. Unsure how long pt was unconscious on floor. EMS was called at 7:15 am. Pt takes ASA- no other blood thinners. Pt has hematoma/ skin tear over left eye. Pt complaining of right rib pain and neck pain. C collar in place from EMS. CBG 102, HR 78, BP 126/80.

## 2015-10-26 NOTE — Discharge Instructions (Signed)
Facial Laceration ° A facial laceration is a cut on the face. These injuries can be painful and cause bleeding. Lacerations usually heal quickly, but they need special care to reduce scarring. °DIAGNOSIS  °Your health care provider will take a medical history, ask for details about how the injury occurred, and examine the wound to determine how deep the cut is. °TREATMENT  °Some facial lacerations may not require closure. Others may not be able to be closed because of an increased risk of infection. The risk of infection and the chance for successful closure will depend on various factors, including the amount of time since the injury occurred. °The wound may be cleaned to help prevent infection. If closure is appropriate, pain medicines may be given if needed. Your health care provider will use stitches (sutures), wound glue (adhesive), or skin adhesive strips to repair the laceration. These tools bring the skin edges together to allow for faster healing and a better cosmetic outcome. If needed, you may also be given a tetanus shot. °HOME CARE INSTRUCTIONS °· Only take over-the-counter or prescription medicines as directed by your health care provider. °· Follow your health care provider's instructions for wound care. These instructions will vary depending on the technique used for closing the wound. °For Sutures: °· Keep the wound clean and dry.   °· If you were given a bandage (dressing), you should change it at least once a day. Also change the dressing if it becomes wet or dirty, or as directed by your health care provider.   °· Wash the wound with soap and water 2 times a day. Rinse the wound off with water to remove all soap. Pat the wound dry with a clean towel.   °· After cleaning, apply a thin layer of the antibiotic ointment recommended by your health care provider. This will help prevent infection and keep the dressing from sticking.   °· You may shower as usual after the first 24 hours. Do not soak the  wound in water until the sutures are removed.   °· Get your sutures removed as directed by your health care provider. With facial lacerations, sutures should usually be taken out after 4-5 days to avoid stitch marks.   °· Wait a few days after your sutures are removed before applying any makeup. °For Skin Adhesive Strips: °· Keep the wound clean and dry.   °· Do not get the skin adhesive strips wet. You may bathe carefully, using caution to keep the wound dry.   °· If the wound gets wet, pat it dry with a clean towel.   °· Skin adhesive strips will fall off on their own. You may trim the strips as the wound heals. Do not remove skin adhesive strips that are still stuck to the wound. They will fall off in time.   °For Wound Adhesive: °· You may briefly wet your wound in the shower or bath. Do not soak or scrub the wound. Do not swim. Avoid periods of heavy sweating until the skin adhesive has fallen off on its own. After showering or bathing, gently pat the wound dry with a clean towel.   °· Do not apply liquid medicine, cream medicine, ointment medicine, or makeup to your wound while the skin adhesive is in place. This may loosen the film before your wound is healed.   °· If a dressing is placed over the wound, be careful not to apply tape directly over the skin adhesive. This may cause the adhesive to be pulled off before the wound is healed.   °· Avoid   prolonged exposure to sunlight or tanning lamps while the skin adhesive is in place.  The skin adhesive will usually remain in place for 5-10 days, then naturally fall off the skin. Do not pick at the adhesive film.  After Healing: Once the wound has healed, cover the wound with sunscreen during the day for 1 full year. This can help minimize scarring. Exposure to ultraviolet light in the first year will darken the scar. It can take 1-2 years for the scar to lose its redness and to heal completely.  SEEK MEDICAL CARE IF:  You have a fever. SEEK IMMEDIATE  MEDICAL CARE IF:  You have redness, pain, or swelling around the wound.   You see ayellowish-white fluid (pus) coming from the wound.    This information is not intended to replace advice given to you by your health care provider. Make sure you discuss any questions you have with your health care provider.   Document Released: 08/06/2004 Document Revised: 07/20/2014 Document Reviewed: 02/09/2013 Elsevier Interactive Patient Education 2016 [employer].  Hyponatremia Hyponatremia is when the amount of salt (sodium) in your blood is too low. When sodium levels are low, your cells absorb extra water and they swell. The swelling happens throughout the body, but it mostly affects the brain. CAUSES This condition may be caused by:  Heart, kidney, or liver problems.  Thyroid problems.  Adrenal gland problems.  Metabolic conditions, such as syndrome of inappropriate antidiuretic hormone (SIADH).  Severe vomiting and diarrhea.  Certain medicines or illegal drugs.  Dehydration.  Drinking too much water.  Eating a diet that is low in sodium.  Large burns on your body.  Sweating. RISK FACTORS This condition is more likely to develop in people who:  Have long-term (chronic) kidney disease.  Have heart failure.  Have a medical condition that causes frequent or excessive diarrhea.  Have metabolic conditions, such as Addison disease or SIADH.  Take certain medicines that affect the sodium and fluid balance in the blood. Some of these medicine types include:  Diuretics.  NSAIDs.  Some opioid pain medicines.  Some antidepressants.  Some seizure prevention medicines. SYMPTOMS  Symptoms of this condition include:  Nausea and vomiting.  Confusion.  Lethargy.  Agitation.  Headache.  Seizures.  Unconsciousness.  Appetite loss.  Muscle weakness and cramping.  Feeling weak or light-headed.  Having a rapid heart rate.  Fainting, in severe  cases. DIAGNOSIS This condition is diagnosed with a medical history and physical exam. You will also have other tests, including:  Blood tests.  Urine tests. TREATMENT Treatment for this condition depends on the cause. Treatment may include:  Fluids given through an IV tube that is inserted into one of your veins.  Medicines to correct the sodium imbalance. If medicines are causing the condition, the medicines will need to be adjusted.  Limiting water or fluid intake to get the correct sodium balance. HOME CARE INSTRUCTIONS  Take medicines only as directed by your health care provider. Many medicines can make this condition worse. Talk with your health care provider about any medicines that you are currently taking.  Carefully follow a recommended diet as directed by your health care provider.  Carefully follow instructions from your health care provider about fluid restrictions.  Keep all follow-up visits as directed by your health care provider. This is important.  Do not drink alcohol. SEEK MEDICAL CARE IF:  You develop worsening nausea, fatigue, headache, confusion, or weakness.  Your symptoms go away and then return.  You have problems following the recommended diet. SEEK IMMEDIATE MEDICAL CARE IF:  You have a seizure.  You faint.  You have ongoing diarrhea or vomiting.   This information is not intended to replace advice given to you by your health care provider. Make sure you discuss any questions you have with your health care provider.   Document Released: 06/19/2002 Document Revised: 11/13/2014 Document Reviewed: 07/19/2014 Elsevier Interactive Patient Education [employer].

## 2015-10-26 NOTE — ED Provider Notes (Signed)
CSN: [id number]     Arrival date & time 10/26/15  [id number] History   First MD Initiated Contact with Patient 10/26/15 [phone]     Chief Complaint  Patient presents with  . Fall     Patient is a 82 y.o. female presenting with fall. The history is provided by the patient and the EMS personnel. No language interpreter was used.  Fall   Haley Harris is a 82 y.o. female who presents to the Emergency Department complaining of fall. She presents for evaluation of injuries on a fall this morning. She was in the bathroom on the commode when she fell to the side striking her left head. She did lose consciousness for an unknown amount of time. She was able to drag herself to crawl out of the bathroom to the hallway where she notified her daughter who called EMS. She has pain in her left forehead and her left neck. She denies a chest pain, shortness of breath, abdominal pain, vomiting, fevers.  Past Medical History  Diagnosis Date  . Allergic rhinitis, cause unspecified   . Unspecified cardiovascular disease   . Personal history of malignant neoplasm of breast   . Occlusion and stenosis of carotid artery without mention of cerebral infarction   . Chronic airway obstruction, not elsewhere classified   . Depressive disorder, not elsewhere classified   . Other dyspnea and respiratory abnormality   . Esophageal reflux   . Unspecified hearing loss   . Other and unspecified hyperlipidemia   . Unspecified essential hypertension   . Osteoporosis, unspecified   . Peripheral vascular disease, unspecified ([place])   . Unspecified urinary incontinence   . Spinal stenosis   . ACE-inhibitor cough   . Angina   . Heart murmur     aS CHILD  . Shortness of breath   . Recurrent upper respiratory infection (URI)   . Anxiety   . Pneumonia   . 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 Myocardial infarct ([place])   . Osteoarthrosis, unspecified whether generalized or localized, unspecified site   .  Malignant neoplasm of breast (female), unspecified site   . Compression fracture of T12 vertebra ([place]) 10/28/2011  . CHF (congestive heart failure) [facility])    Past Surgical History  Procedure Laterality Date  . Mastectomy  1987    Right  . Breast reconstruction  1998    Reconstruction   . Reduction mammaplasty  1998    Left  . Mastoidectomy  childhood  . Appendectomy  1948  . Lumbar laminectomy  2003  . Shoulder surgery  02/2005    Bilateral fractures with multiple surgeries  . Breast implant removal  06/12/09    right  . Abdominal hysterectomy    . Fracture surgery      fracture right elbow  . Coronary angioplasty  11/12    distal RCA  . Mastectomy    . Left heart catheterization with coronary angiogram N/A 06/05/2011    Procedure: LEFT HEART CATHETERIZATION WITH CORONARY ANGIOGRAM;  Surgeon: Clent Demark, MD;  Location: [facility] CATH LAB;  Service: Cardiovascular;  Laterality: N/A;  . Left heart catheterization with coronary angiogram N/A 03/05/2014    Procedure: LEFT HEART CATHETERIZATION WITH CORONARY ANGIOGRAM;  Surgeon: Clent Demark, MD;  Location: [facility] CATH LAB;  Service: Cardiovascular;  Laterality: N/A;  . Flexible sigmoidoscopy N/A 01/21/2015    Procedure: FLEXIBLE SIGMOIDOSCOPY;  Surgeon: Richmond Campbell, MD;  Location: [facility] ENDOSCOPY;  Service: Endoscopy;  Laterality: N/A;  . Flexible sigmoidoscopy  01/22/2015       . Cardiac catheterization N/A 01/25/2015    Procedure: Left Heart Cath and Coronary Angiography;  Surgeon: Charolette Forward, MD;  Location: [facility] CV LAB;  Service: Cardiovascular;  Laterality: N/A;   Family History  Problem Relation Age of Onset  . Heart attack Father 36  . Cancer Mother     ovarian  . Cancer Brother     lung  . Arthritis      family   Social History  Substance Use Topics  . Smoking status: Former Smoker    Quit date: 07/13/1974  . Smokeless tobacco: Never Used  . Alcohol Use: 0.0 oz/week    0 Standard drinks or equivalent per week      Comment: occasional   OB History    No data available     Review of Systems  All other systems reviewed and are negative.     Allergies  Amoxicillin-pot clavulanate; Cephalexin; Clarithromycin; Lidocaine; Nifedipine; Nsaids; Olmesartan medoxomil; Penicillins; and Tramadol hcl  Home Medications   Prior to Admission medications   Medication Sig Start Date End Date Taking? Authorizing Provider  acetaminophen (TYLENOL) 325 MG tablet Take 2 tablets (650 mg total) by mouth every 4 (four) hours as needed for headache or mild pain. 01/27/15  Yes Bonnielee Haff, MD  albuterol (PROVENTIL HFA;VENTOLIN HFA) 108 (90 Base) MCG/ACT inhaler Inhale 2 puffs into the lungs every 6 (six) hours as needed for wheezing or shortness of breath. 10/09/15  Yes Shanker Kristeen Mans, MD  albuterol (PROVENTIL) (2.5 MG/3ML) 0.083% nebulizer solution Take 3 mLs (2.5 mg total) by nebulization every 6 (six) hours as needed for wheezing or shortness of breath. 02/18/15  Yes Venia Carbon, MD  aspirin 81 MG tablet Take 81 mg by mouth daily after lunch.    Yes Historical Provider, MD  Calcium Carbonate-Vitamin D (CALTRATE 600+D PO) Take 1 tablet by mouth daily after lunch.    Yes Historical Provider, MD  carvedilol (COREG) 12.5 MG tablet Take 1 tablet (12.5 mg total) by mouth 2 (two) times daily with a meal. 09/13/15  Yes Venia Carbon, MD  denosumab (PROLIA) 60 MG/ML SOLN injection Inject 60 mg into the skin every 6 (six) months. Administer in upper arm, thigh, or abdomen   Yes Historical Provider, MD  escitalopram (LEXAPRO) 10 MG tablet Take 1 tablet by mouth  daily 08/26/15  Yes Venia Carbon, MD  famotidine (PEPCID) 20 MG tablet Take 1 tablet (20 mg total) by mouth 2 (two) times daily. 09/13/15  Yes Venia Carbon, MD  furosemide (LASIX) 40 MG tablet Take 1 tablet (40 mg total) by mouth 2 (two) times daily. 10/09/15  Yes Shanker Kristeen Mans, MD  gabapentin (NEURONTIN) 100 MG capsule Take 1 capsule (100 mg total) by mouth 2  (two) times daily. 07/23/14  Yes Venia Carbon, MD  levocetirizine (XYZAL) 5 MG tablet Take 5 mg by mouth every evening.   Yes Historical Provider, MD  losartan (COZAAR) 50 MG tablet TAKE 1 TABLET BY MOUTH DAILY 09/13/15  Yes Venia Carbon, MD  montelukast (SINGULAIR) 10 MG tablet TAKE 1 TABLET BY MOUTH DAILY 09/13/15  Yes Venia Carbon, MD  Multiple Vitamin (MULITIVITAMIN WITH MINERALS) TABS Take 1 tablet by mouth daily after lunch.    Yes Historical Provider, MD  oxyCODONE-acetaminophen (PERCOCET/ROXICET) 5-325 MG per tablet Take 1-2 tablets by mouth every 4 (four) hours as needed. 01/28/15  Yes Gokul  [place] Pink, MD  potassium chloride SA (K-DUR,KLOR-CON) 20 MEQ tablet Take 1 tablet (20 mEq total) by mouth 2 (two) times daily. 10/09/15  Yes Shanker Kristeen Mans, MD  pravastatin (PRAVACHOL) 80 MG tablet Take 1 tablet by mouth  daily 05/09/15  Yes Venia Carbon, MD  spironolactone (ALDACTONE) 25 MG tablet Take 1 tablet by mouth  daily 08/26/15  Yes Venia Carbon, MD  Sulfamethoxazole-Trimethoprim (BACTRIM PO) Take 1 tablet by mouth 2 (two) times daily.   Yes Historical Provider, MD  vitamin B-12 (CYANOCOBALAMIN) 500 MCG tablet Take 1,000 mcg by mouth 3 (three) times a week. Mon, Wed, Fri   Yes Historical Provider, MD  predniSONE (DELTASONE) 10 MG tablet Take 4 tablets (40 mg) daily for 2 days, then, Take 3 tablets (30 mg) daily for 2 days, then, Take 2 tablets (20 mg) daily for 2 days, then, Take 1 tablets (10 mg) daily for 1 days, then stop Patient not taking: Reported on 10/22/2015 10/09/15   Shanker Kristeen Mans, MD   BP 123/64 mmHg  Pulse 88  Temp(Src) 98.1 F (36.7 C) (Oral)  Resp 19  Ht 5\' 6"  (1.676 m)  Wt 169 lb (76.658 kg)  BMI 27.29 kg/m2  SpO2 99% Physical Exam  Constitutional: She is oriented to person, place, and time. She appears well-developed and well-nourished.  HENT:  Head: Normocephalic.  Large U-shaped laceration to the left forehead.  Eyes: EOM are normal. Pupils are  equal, round, and reactive to light.  Neck:  No C-spine tenderness  Cardiovascular: Normal rate and regular rhythm.   No murmur heard. Pulmonary/Chest: Effort normal and breath sounds normal. No respiratory distress.  Kyphosis  Abdominal: Soft. There is no tenderness. There is no rebound and no guarding.  Musculoskeletal: She exhibits no tenderness.  Nonpitting edema bilateral lower extremities  Neurological: She is alert and oriented to person, place, and time.  Skin: Skin is warm and dry.  Psychiatric: She has a normal mood and affect. Her behavior is normal.  Nursing note and vitals reviewed.   ED Course  Procedures (including critical care time) Labs Review Labs Reviewed  COMPREHENSIVE METABOLIC PANEL - Abnormal; Notable for the following:    Sodium 125 (*)    Chloride 92 (*)    CO2 21 (*)    Creatinine, Ser 1.36 (*)    Calcium 8.8 (*)    Albumin 3.4 (*)    GFR calc non Af Amer 35 (*)    GFR calc Af Amer 40 (*)    All other components within normal limits  CBC WITH DIFFERENTIAL/PLATELET - Abnormal; Notable for the following:    RBC 2.91 (*)    Hemoglobin 9.6 (*)    HCT 27.4 (*)    Lymphs Abs 0.5 (*)    All other components within normal limits  I-STAT TROPOININ, ED    Imaging Review Dg Chest 2 View  10/26/2015  CLINICAL DATA:  Woke up on the bathroom floor this morning. Ex-smoker. EXAM: CHEST  2 VIEW COMPARISON:  Previous examinations, the most recent dated 10/02/2015. FINDINGS: The cardiac silhouette remains mildly enlarged. Mild diffuse peribronchial thickening is unchanged. Costal cartilage calcifications overlying the medial left lung base. Small amount of left pleural thickening or fluid. Diffuse osteopenia with multiple old, stable thoracic vertebral compression deformities and old, healed right humeral neck fracture. IMPRESSION: 1. No acute abnormality. 2. Stable mild chronic bronchitic changes and mild cardiomegaly. 3. Small amount of left pleural thickening or  fluid. Electronically Signed   By:  Claudie Revering M.D.   On: 10/26/2015 09:25   Ct Head Wo Contrast  10/26/2015  CLINICAL DATA:  82 year old female with history of trauma from a fall, with associated loss of consciousness. Injury to the left forehead. EXAM: CT HEAD WITHOUT CONTRAST CT CERVICAL SPINE WITHOUT CONTRAST TECHNIQUE: Multidetector CT imaging of the head and cervical spine was performed following the standard protocol without intravenous contrast. Multiplanar CT image reconstructions of the cervical spine were also generated. COMPARISON:  Head CT 07/21/2011.  Cervical spine CT 07/21/2011. FINDINGS: CT HEAD FINDINGS Extensive soft tissue swelling, high attenuation fluid, and gas in the left frontal scalp, compatible with a scalp laceration and hematoma. No acute displaced skull fractures are identified. No acute intracranial abnormality. Specifically, no evidence of acute post-traumatic intracranial hemorrhage, no definite regions of acute/subacute cerebral ischemia, no focal mass, mass effect, hydrocephalus or abnormal intra or extra-axial fluid collections. There is mild cerebral atrophy. Patchy and confluent areas of decreased attenuation are noted throughout the deep and periventricular white matter of the cerebral hemispheres bilaterally, compatible with chronic microvascular ischemic disease. Left mastoids are well pneumatized. Status post partial right mastoidectomy. Complete opacification of the left frontal sinus, anterior left ethmoid sinuses, the left frontoethmoidal recess, and the left maxillary sinus, which all demonstrate some associated mucoperiosteal thickening, and contain intermediate attenuation material which likely represents inspissated secretions. CT CERVICAL SPINE FINDINGS No acute displaced fractures of the cervical spine. Prevertebral soft tissues are normal. Multilevel degenerative disc disease, most severe at C6-C7. Severe multilevel facet arthropathy. Exaggeration of cervical  lordosis in the upper cervical spine, presumably positional. Alignment is otherwise anatomic. Visualized portions of the upper thorax are unremarkable. Extensive atherosclerosis. IMPRESSION: 1. Large left frontal scalp contusion/laceration with left frontal scalp hematoma. No underlying displaced skull fracture or signs of significant acute traumatic injury to the brain. 2. No evidence of significant acute traumatic injury to the cervical spine. 3. Mild cerebral atrophy with extensive chronic microvascular ischemic changes in the cerebral white matter, as above. 4. Extensive multilevel degenerative disc disease and cervical spondylosis, as above. 5. Evidence of chronic sinusitis involving the left-sided frontal, anterior ethmoid, and maxillary sinuses, as above. Electronically Signed   By: Vinnie Langton M.D.   On: 10/26/2015 09:47   Ct Cervical Spine Wo Contrast  10/26/2015  CLINICAL DATA:  82 year old female with history of trauma from a fall, with associated loss of consciousness. Injury to the left forehead. EXAM: CT HEAD WITHOUT CONTRAST CT CERVICAL SPINE WITHOUT CONTRAST TECHNIQUE: Multidetector CT imaging of the head and cervical spine was performed following the standard protocol without intravenous contrast. Multiplanar CT image reconstructions of the cervical spine were also generated. COMPARISON:  Head CT 07/21/2011.  Cervical spine CT 07/21/2011. FINDINGS: CT HEAD FINDINGS Extensive soft tissue swelling, high attenuation fluid, and gas in the left frontal scalp, compatible with a scalp laceration and hematoma. No acute displaced skull fractures are identified. No acute intracranial abnormality. Specifically, no evidence of acute post-traumatic intracranial hemorrhage, no definite regions of acute/subacute cerebral ischemia, no focal mass, mass effect, hydrocephalus or abnormal intra or extra-axial fluid collections. There is mild cerebral atrophy. Patchy and confluent areas of decreased attenuation  are noted throughout the deep and periventricular white matter of the cerebral hemispheres bilaterally, compatible with chronic microvascular ischemic disease. Left mastoids are well pneumatized. Status post partial right mastoidectomy. Complete opacification of the left frontal sinus, anterior left ethmoid sinuses, the left frontoethmoidal recess, and the left maxillary sinus, which all demonstrate some associated mucoperiosteal  thickening, and contain intermediate attenuation material which likely represents inspissated secretions. CT CERVICAL SPINE FINDINGS No acute displaced fractures of the cervical spine. Prevertebral soft tissues are normal. Multilevel degenerative disc disease, most severe at C6-C7. Severe multilevel facet arthropathy. Exaggeration of cervical lordosis in the upper cervical spine, presumably positional. Alignment is otherwise anatomic. Visualized portions of the upper thorax are unremarkable. Extensive atherosclerosis. IMPRESSION: 1. Large left frontal scalp contusion/laceration with left frontal scalp hematoma. No underlying displaced skull fracture or signs of significant acute traumatic injury to the brain. 2. No evidence of significant acute traumatic injury to the cervical spine. 3. Mild cerebral atrophy with extensive chronic microvascular ischemic changes in the cerebral white matter, as above. 4. Extensive multilevel degenerative disc disease and cervical spondylosis, as above. 5. Evidence of chronic sinusitis involving the left-sided frontal, anterior ethmoid, and maxillary sinuses, as above. Electronically Signed   By: Vinnie Langton M.D.   On: 10/26/2015 09:47   I have personally reviewed and evaluated these images and lab results as part of my medical decision-making.   EKG Interpretation   Date/Time:  Saturday October 26 2015 08:34:03 EDT Ventricular Rate:  79 PR Interval:  169 QRS Duration: 109 QT Interval:  398 QTC Calculation: 456 R Axis:   -121 Text  Interpretation:  Sinus rhythm Markedly posterior QRS axis Consider  anterior infarct Confirmed by Hazle Coca [phone]) on 10/26/2015 12:00:01 PM      MDM   Final diagnoses:  Facial laceration, initial encounter  Hyponatremia   Patient here for evaluation of injuries following a fall with syncope. After repeat evaluation in the emergency Department family noted that she fell asleep on the command. Her wound was repaired per procedure note. Patient feels improved on repeat evaluation is able to ambulate in the department without difficulty. She is noted to be hyponatremic with sodium of 125 asymptomatic at this time. She is mildly dehydrated due to decreased oral intake, will give her a small fluid bolus and instruct her to increase her fluid intake at home. Discussed repeat sodium level in 2 days and PCP follow-up as well as close return precautions for any new or worrisome symptoms. Offered patient admission for observation and patient declined.    Quintella Reichert, MD 10/26/15 1721

## 2015-10-26 NOTE — ED Provider Notes (Signed)
Asked to perform laceration repair by Quintella Reichert, MD.  [place] Performed by: Lorayne Bender Authorized by: Lorayne Bender Consent: Verbal consent obtained. Risks and benefits: risks, benefits and alternatives were discussed Consent given by: patient Patient identity confirmed: provided demographic data Prepped and Draped in normal sterile fashion Wound explored  Laceration Location: Left forehead  Laceration Length: 10.5 cm  No Foreign Bodies seen or palpated  Anesthesia: local infiltration  Local anesthetic: lidocaine 2% 1:200000 epinephrine  Anesthetic total: 5 ml  Irrigation method: syringe Amount of cleaning: standard  Skin closure: Complex, close approximation  Number of sutures: About 40  Technique: 11 deep dermal (4-0 Vicryl); About 30 running subcuticular (5-0 Monocryl)   Patient tolerance: Patient tolerated the procedure well with no immediate complications.         Lorayne Bender, PA-C 10/26/15 1334  Quintella Reichert, MD 10/27/15 [phone]

## 2015-10-26 NOTE — ED Notes (Signed)
Pt ambulated to and from restroom. No dizziness lightheadedness. Pt stated she was short of breath upon returning to the room, which she said is normal for her. O2 sats are 97% on room air.

## 2015-10-26 NOTE — ED Notes (Signed)
Cleansed pt's wound and rebandaged

## 2015-10-28 ENCOUNTER — Telehealth: Payer: Self-pay

## 2015-10-28 NOTE — Telephone Encounter (Signed)
[place] pts daughter (do not see DPR) left v/m; pt seen [place] on 10/26/15 due to fall and pt hit head(took 50 stitches on forehead) and was unconscious for undetermined amt of time. Labs at ED showed low Na and elevated kidney function; pt was to have labs repeated on 10/28/15; pt already has appt with Dr Silvio Pate on 10/29/15; is it OK for pt to wait and gets labs on 10/29/15. [place] request cb. Dr Silvio Pate is out of office.

## 2015-10-28 NOTE — Telephone Encounter (Signed)
[place] advised.

## 2015-10-28 NOTE — Telephone Encounter (Signed)
Should be okay for appointment tomorrow. Thanks.

## 2015-10-29 ENCOUNTER — Telehealth: Payer: Self-pay | Admitting: *Deleted

## 2015-10-29 ENCOUNTER — Encounter: Payer: Self-pay | Admitting: Internal Medicine

## 2015-10-29 ENCOUNTER — Ambulatory Visit (INDEPENDENT_AMBULATORY_CARE_PROVIDER_SITE_OTHER): Payer: Medicare Other | Admitting: Internal Medicine

## 2015-10-29 ENCOUNTER — Emergency Department (HOSPITAL_COMMUNITY): Payer: Medicare Other

## 2015-10-29 ENCOUNTER — Encounter (HOSPITAL_COMMUNITY): Payer: Self-pay | Admitting: *Deleted

## 2015-10-29 ENCOUNTER — Inpatient Hospital Stay (HOSPITAL_COMMUNITY)
Admission: EM | Admit: 2015-10-29 | Discharge: 2015-11-02 | DRG: 641 | Disposition: A | Discharge status: Home Health | Payer: Medicare Other | Attending: Internal Medicine | Admitting: Internal Medicine

## 2015-10-29 VITALS — BP 110/60 | HR 76 | Temp 97.4°F | Wt 176.0 lb

## 2015-10-29 DIAGNOSIS — K219 Gastro-esophageal reflux disease without esophagitis: Secondary | ICD-10-CM | POA: Diagnosis present

## 2015-10-29 DIAGNOSIS — M1990 Unspecified osteoarthritis, unspecified site: Secondary | ICD-10-CM | POA: Diagnosis not present

## 2015-10-29 DIAGNOSIS — I10 Essential (primary) hypertension: Secondary | ICD-10-CM | POA: Diagnosis present

## 2015-10-29 DIAGNOSIS — G479 Sleep disorder, unspecified: Secondary | ICD-10-CM | POA: Diagnosis present

## 2015-10-29 DIAGNOSIS — Z7982 Long term (current) use of aspirin: Secondary | ICD-10-CM

## 2015-10-29 DIAGNOSIS — E871 Hypo-osmolality and hyponatremia: Secondary | ICD-10-CM | POA: Diagnosis not present

## 2015-10-29 DIAGNOSIS — N179 Acute kidney failure, unspecified: Secondary | ICD-10-CM | POA: Diagnosis present

## 2015-10-29 DIAGNOSIS — H9190 Unspecified hearing loss, unspecified ear: Secondary | ICD-10-CM | POA: Diagnosis present

## 2015-10-29 DIAGNOSIS — Z853 Personal history of malignant neoplasm of breast: Secondary | ICD-10-CM | POA: Diagnosis not present

## 2015-10-29 DIAGNOSIS — J449 Chronic obstructive pulmonary disease, unspecified: Secondary | ICD-10-CM | POA: Diagnosis not present

## 2015-10-29 DIAGNOSIS — J309 Allergic rhinitis, unspecified: Secondary | ICD-10-CM | POA: Diagnosis not present

## 2015-10-29 DIAGNOSIS — I739 Peripheral vascular disease, unspecified: Secondary | ICD-10-CM | POA: Diagnosis present

## 2015-10-29 DIAGNOSIS — E785 Hyperlipidemia, unspecified: Secondary | ICD-10-CM | POA: Diagnosis not present

## 2015-10-29 DIAGNOSIS — E875 Hyperkalemia: Secondary | ICD-10-CM | POA: Diagnosis present

## 2015-10-29 DIAGNOSIS — N183 Chronic kidney disease, stage 3 unspecified: Secondary | ICD-10-CM

## 2015-10-29 DIAGNOSIS — Z888 Allergy status to other drugs, medicaments and biological substances status: Secondary | ICD-10-CM | POA: Diagnosis not present

## 2015-10-29 DIAGNOSIS — M810 Age-related osteoporosis without current pathological fracture: Secondary | ICD-10-CM | POA: Diagnosis not present

## 2015-10-29 DIAGNOSIS — R531 Weakness: Secondary | ICD-10-CM

## 2015-10-29 DIAGNOSIS — I130 Hypertensive heart and chronic kidney disease with heart failure and stage 1 through stage 4 chronic kidney disease, or unspecified chronic kidney disease: Secondary | ICD-10-CM | POA: Diagnosis present

## 2015-10-29 DIAGNOSIS — F329 Major depressive disorder, single episode, unspecified: Secondary | ICD-10-CM | POA: Diagnosis present

## 2015-10-29 DIAGNOSIS — I5042 Chronic combined systolic (congestive) and diastolic (congestive) heart failure: Secondary | ICD-10-CM

## 2015-10-29 DIAGNOSIS — Z886 Allergy status to analgesic agent status: Secondary | ICD-10-CM

## 2015-10-29 DIAGNOSIS — I252 Old myocardial infarction: Secondary | ICD-10-CM | POA: Diagnosis not present

## 2015-10-29 DIAGNOSIS — Z880 Allergy status to penicillin: Secondary | ICD-10-CM | POA: Diagnosis not present

## 2015-10-29 DIAGNOSIS — S0181XA Laceration without foreign body of other part of head, initial encounter: Secondary | ICD-10-CM | POA: Diagnosis not present

## 2015-10-29 DIAGNOSIS — E860 Dehydration: Secondary | ICD-10-CM | POA: Diagnosis present

## 2015-10-29 DIAGNOSIS — Z87891 Personal history of nicotine dependence: Secondary | ICD-10-CM

## 2015-10-29 DIAGNOSIS — R0602 Shortness of breath: Secondary | ICD-10-CM | POA: Diagnosis not present

## 2015-10-29 DIAGNOSIS — J439 Emphysema, unspecified: Secondary | ICD-10-CM | POA: Diagnosis not present

## 2015-10-29 DIAGNOSIS — G709 Myoneural disorder, unspecified: Secondary | ICD-10-CM | POA: Diagnosis not present

## 2015-10-29 DIAGNOSIS — Z9181 History of falling: Secondary | ICD-10-CM

## 2015-10-29 DIAGNOSIS — W19XXXA Unspecified fall, initial encounter: Secondary | ICD-10-CM | POA: Insufficient documentation

## 2015-10-29 LAB — COMPREHENSIVE METABOLIC PANEL
ALBUMIN: 3.3 g/dL — AB (ref 3.5–5.0)
ALK PHOS: 54 U/L (ref 38–126)
ALT: 16 U/L (ref 14–54)
AST: 19 U/L (ref 15–41)
Anion gap: 10 (ref 5–15)
BILIRUBIN TOTAL: 0.3 mg/dL (ref 0.3–1.2)
BUN: 27 mg/dL — AB (ref 6–20)
CALCIUM: 8.3 mg/dL — AB (ref 8.9–10.3)
CO2: 21 mmol/L — ABNORMAL LOW (ref 22–32)
CREATININE: 1.69 mg/dL — AB (ref 0.44–1.00)
Chloride: 90 mmol/L — ABNORMAL LOW (ref 101–111)
GFR calc Af Amer: 31 mL/min — ABNORMAL LOW (ref >60)
GFR calc non Af Amer: 27 mL/min — ABNORMAL LOW (ref >60)
GLUCOSE: 89 mg/dL (ref 65–99)
Potassium: 5.3 mmol/L — ABNORMAL HIGH (ref 3.5–5.1)
Sodium: 121 mmol/L — ABNORMAL LOW (ref 135–145)
TOTAL PROTEIN: 5.8 g/dL — AB (ref 6.5–8.1)

## 2015-10-29 LAB — URINALYSIS, ROUTINE W REFLEX MICROSCOPIC
Bilirubin Urine: NEGATIVE
GLUCOSE, UA: NEGATIVE mg/dL
HGB URINE DIPSTICK: NEGATIVE
KETONES UR: NEGATIVE mg/dL
Nitrite: NEGATIVE
PROTEIN: NEGATIVE mg/dL
Specific Gravity, Urine: 1.012 (ref 1.005–1.030)
pH: 6 (ref 5.0–8.0)

## 2015-10-29 LAB — RENAL FUNCTION PANEL
Albumin: 3.5 g/dL (ref 3.5–5.2)
BUN: 27 mg/dL — AB (ref 6–23)
CALCIUM: 8.4 mg/dL (ref 8.4–10.5)
CHLORIDE: 88 meq/L — AB (ref 96–112)
CO2: 26 mEq/L (ref 19–32)
Creatinine, Ser: 1.78 mg/dL — ABNORMAL HIGH (ref 0.40–1.20)
GFR: 28.82 mL/min — ABNORMAL LOW (ref >60.00)
GLUCOSE: 106 mg/dL — AB (ref 70–99)
POTASSIUM: 5.1 meq/L (ref 3.5–5.1)
Phosphorus: 4.1 mg/dL (ref 2.3–4.6)
SODIUM: 118 meq/L — AB (ref 135–145)

## 2015-10-29 LAB — CBC
HEMATOCRIT: 26 % — AB (ref 36.0–46.0)
Hemoglobin: 9.1 g/dL — ABNORMAL LOW (ref 12.0–15.0)
MCH: 33.2 pg (ref 26.0–34.0)
MCHC: 35 g/dL (ref 30.0–36.0)
MCV: 94.9 fL (ref 78.0–100.0)
PLATELETS: 291 10*3/uL (ref 150–400)
RBC: 2.74 MIL/uL — ABNORMAL LOW (ref 3.87–5.11)
RDW: 13.3 % (ref 11.5–15.5)
WBC: 6.8 10*3/uL (ref 4.0–10.5)

## 2015-10-29 LAB — URINE MICROSCOPIC-ADD ON

## 2015-10-29 MED ORDER — CARVEDILOL 12.5 MG PO TABS
12.5000 mg | ORAL_TABLET | Freq: Two times a day (BID) | ORAL | Status: DC
  Administered 2015-10-30 – 2015-11-02 (×7): 12.5 mg via ORAL
  Filled 2015-10-29 (×7): qty 1

## 2015-10-29 MED ORDER — SODIUM POLYSTYRENE SULFONATE 15 GM/60ML PO SUSP
30.0000 g | Freq: Once | ORAL | Status: AC
  Administered 2015-10-30: 30 g via ORAL
  Filled 2015-10-29: qty 120

## 2015-10-29 MED ORDER — ENOXAPARIN SODIUM 40 MG/0.4ML ~~LOC~~ SOLN
40.0000 mg | Freq: Every day | SUBCUTANEOUS | Status: DC
  Administered 2015-10-30 – 2015-11-02 (×4): 40 mg via SUBCUTANEOUS
  Filled 2015-10-29 (×4): qty 0.4

## 2015-10-29 MED ORDER — ADULT MULTIVITAMIN W/MINERALS CH
1.0000 | ORAL_TABLET | Freq: Every day | ORAL | Status: DC
  Administered 2015-10-30 – 2015-11-02 (×4): 1 via ORAL
  Filled 2015-10-29 (×4): qty 1

## 2015-10-29 MED ORDER — GABAPENTIN 100 MG PO CAPS
100.0000 mg | ORAL_CAPSULE | Freq: Two times a day (BID) | ORAL | Status: DC
  Administered 2015-10-30 – 2015-11-02 (×8): 100 mg via ORAL
  Filled 2015-10-29 (×9): qty 1

## 2015-10-29 MED ORDER — ONDANSETRON HCL 4 MG/2ML IJ SOLN: 4.0000 mg | Freq: Four times a day (QID) | INTRAMUSCULAR | Status: DC | PRN

## 2015-10-29 MED ORDER — VITAMIN B-12 1000 MCG PO TABS
1000.0000 ug | ORAL_TABLET | ORAL | Status: DC
  Administered 2015-10-30 – 2015-11-01 (×2): 1000 ug via ORAL
  Filled 2015-10-29: qty 1
  Filled 2015-10-29: qty 2
  Filled 2015-10-29: qty 1

## 2015-10-29 MED ORDER — SODIUM CHLORIDE 0.9 % IV SOLN
INTRAVENOUS | Status: DC
  Administered 2015-10-30: 03:00:00 via INTRAVENOUS

## 2015-10-29 MED ORDER — CALCIUM CARBONATE-VITAMIN D 500-200 MG-UNIT PO TABS
1.0000 | ORAL_TABLET | Freq: Every day | ORAL | Status: DC
  Administered 2015-10-30 – 2015-11-02 (×4): 1 via ORAL
  Filled 2015-10-29 (×4): qty 1

## 2015-10-29 MED ORDER — HYDRALAZINE HCL 20 MG/ML IJ SOLN: 5.0000 mg | INTRAMUSCULAR | Status: DC | PRN

## 2015-10-29 MED ORDER — MONTELUKAST SODIUM 10 MG PO TABS
10.0000 mg | ORAL_TABLET | Freq: Every day | ORAL | Status: DC
  Administered 2015-10-30 – 2015-11-01 (×4): 10 mg via ORAL
  Filled 2015-10-29 (×4): qty 1

## 2015-10-29 MED ORDER — ALBUTEROL SULFATE (2.5 MG/3ML) 0.083% IN NEBU: 2.5000 mg | INHALATION_SOLUTION | Freq: Four times a day (QID) | RESPIRATORY_TRACT | Status: DC | PRN

## 2015-10-29 MED ORDER — FAMOTIDINE 20 MG PO TABS
20.0000 mg | ORAL_TABLET | Freq: Two times a day (BID) | ORAL | Status: DC
  Administered 2015-10-30 – 2015-11-02 (×8): 20 mg via ORAL
  Filled 2015-10-29 (×8): qty 1

## 2015-10-29 MED ORDER — ESCITALOPRAM OXALATE 10 MG PO TABS
10.0000 mg | ORAL_TABLET | Freq: Every day | ORAL | Status: DC
  Administered 2015-10-30: 10 mg via ORAL
  Filled 2015-10-29: qty 1

## 2015-10-29 MED ORDER — ACETAMINOPHEN 325 MG PO TABS
650.0000 mg | ORAL_TABLET | ORAL | Status: DC | PRN
  Administered 2015-10-30 – 2015-10-31 (×4): 650 mg via ORAL
  Filled 2015-10-29 (×4): qty 2

## 2015-10-29 MED ORDER — SODIUM CHLORIDE 0.9% FLUSH
3.0000 mL | Freq: Two times a day (BID) | INTRAVENOUS | Status: DC
  Administered 2015-10-30 – 2015-11-02 (×7): 3 mL via INTRAVENOUS

## 2015-10-29 MED ORDER — ASPIRIN EC 81 MG PO TBEC
81.0000 mg | DELAYED_RELEASE_TABLET | Freq: Every day | ORAL | Status: DC
  Administered 2015-10-30 – 2015-11-01 (×3): 81 mg via ORAL
  Filled 2015-10-29 (×3): qty 1

## 2015-10-29 MED ORDER — OXYCODONE-ACETAMINOPHEN 5-325 MG PO TABS
1.0000 | ORAL_TABLET | ORAL | Status: DC | PRN
  Administered 2015-10-31: 1 via ORAL
  Administered 2015-11-01 (×2): 2 via ORAL
  Filled 2015-10-29 (×3): qty 2
  Filled 2015-10-29: qty 1

## 2015-10-29 MED ORDER — ONDANSETRON HCL 4 MG PO TABS: 4.0000 mg | ORAL_TABLET | Freq: Four times a day (QID) | ORAL | Status: DC | PRN

## 2015-10-29 MED ORDER — LORATADINE 10 MG PO TABS
10.0000 mg | ORAL_TABLET | Freq: Every evening | ORAL | Status: DC
  Administered 2015-10-30 – 2015-11-01 (×3): 10 mg via ORAL
  Filled 2015-10-29 (×3): qty 1

## 2015-10-29 MED ORDER — PRAVASTATIN SODIUM 40 MG PO TABS
80.0000 mg | ORAL_TABLET | Freq: Every day | ORAL | Status: DC
  Administered 2015-10-30: 80 mg via ORAL
  Filled 2015-10-29: qty 2

## 2015-10-29 NOTE — Telephone Encounter (Signed)
Thanks.  Routed to PCP as FYI.  

## 2015-10-29 NOTE — Telephone Encounter (Signed)
Received call from [facility] lab, critical [place].

## 2015-10-29 NOTE — Progress Notes (Signed)
Subjective:    Patient ID: Haley Harris, female    DOB: 1929-05-11, 82 y.o.   MRN: [id number]  HPI Here for ER follow up Daughter is with her Reviewed ER records  Had gone to bathroom around 5:30AM Had rough night so fell asleep on commode Awoke as she was falling--thinks she hit walker LOC when hit floor--- awoke in puddle of blood Able to wiggle across floor and yelled for daughter --may have been 2 hours  Has a mild headache--tylenol is helping No vision change--other than left eye swelling that affected vision yesterday Shivering a lot--or seems shaky (like when up or doing any exerion)  Ongoing SOB--may be some worse than her baseline Had improved since her last visit Using albuterol daily before any activity (like going out to the car)  Had lower sodium in ER Has increased her fluid intake  Current Outpatient Prescriptions on File Prior to Visit  Medication Sig Dispense Refill  . acetaminophen (TYLENOL) 325 MG tablet Take 2 tablets (650 mg total) by mouth every 4 (four) hours as needed for headache or mild pain.    Marland Kitchen albuterol (PROVENTIL HFA;VENTOLIN HFA) 108 (90 Base) MCG/ACT inhaler Inhale 2 puffs into the lungs every 6 (six) hours as needed for wheezing or shortness of breath. 1 Inhaler 0  . albuterol (PROVENTIL) (2.5 MG/3ML) 0.083% nebulizer solution Take 3 mLs (2.5 mg total) by nebulization every 6 (six) hours as needed for wheezing or shortness of breath. 540 mL 3  . aspirin 81 MG tablet Take 81 mg by mouth daily after lunch.     . Calcium Carbonate-Vitamin D (CALTRATE 600+D PO) Take 1 tablet by mouth daily after lunch.     . carvedilol (COREG) 12.5 MG tablet Take 1 tablet (12.5 mg total) by mouth 2 (two) times daily with a meal. 180 tablet 1  . denosumab (PROLIA) 60 MG/ML SOLN injection Inject 60 mg into the skin every 6 (six) months. Administer in upper arm, thigh, or abdomen    . escitalopram (LEXAPRO) 10 MG tablet Take 1 tablet by mouth  daily 90 tablet 3  .  famotidine (PEPCID) 20 MG tablet Take 1 tablet (20 mg total) by mouth 2 (two) times daily. 180 tablet 1  . furosemide (LASIX) 40 MG tablet Take 1 tablet (40 mg total) by mouth 2 (two) times daily. 60 tablet 0  . gabapentin (NEURONTIN) 100 MG capsule Take 1 capsule (100 mg total) by mouth 2 (two) times daily. 180 capsule 3  . levocetirizine (XYZAL) 5 MG tablet Take 5 mg by mouth every evening.    Marland Kitchen losartan (COZAAR) 50 MG tablet TAKE 1 TABLET BY MOUTH DAILY 90 tablet 1  . montelukast (SINGULAIR) 10 MG tablet TAKE 1 TABLET BY MOUTH DAILY 90 tablet 1  . Multiple Vitamin (MULITIVITAMIN WITH MINERALS) TABS Take 1 tablet by mouth daily after lunch.     . oxyCODONE-acetaminophen (PERCOCET/ROXICET) 5-325 MG per tablet Take 1-2 tablets by mouth every 4 (four) hours as needed. 30 tablet 0  . potassium chloride SA (K-DUR,KLOR-CON) 20 MEQ tablet Take 1 tablet (20 mEq total) by mouth 2 (two) times daily. 60 tablet 0  . pravastatin (PRAVACHOL) 80 MG tablet Take 1 tablet by mouth  daily 90 tablet 3  . spironolactone (ALDACTONE) 25 MG tablet Take 1 tablet by mouth  daily 90 tablet 3  . Sulfamethoxazole-Trimethoprim (BACTRIM PO) Take 1 tablet by mouth 2 (two) times daily.    . vitamin B-12 (CYANOCOBALAMIN) 500 MCG tablet Take  1,000 mcg by mouth 3 (three) times a week. Mon, Wed, Fri     No current facility-administered medications on file prior to visit.    Allergies  Allergen Reactions  . Amoxicillin-Pot Clavulanate Anaphylaxis  . Cephalexin Other (See Comments)    Reaction unknown  . Clarithromycin Other (See Comments)    Reaction unknown  . Lidocaine     Patient had tremors following use of lidocaine pain patches  . Nifedipine Other (See Comments)    Reaction unknown  . Nsaids Other (See Comments)    Heart issue  . Olmesartan Medoxomil     REACTION: cough;  But tolerating Losartan 02/2014  . Penicillins   . Tramadol Hcl Other (See Comments)    Reaction unknown    Past Medical History  Diagnosis  Date  . Allergic rhinitis, cause unspecified   . Unspecified cardiovascular disease   . Personal history of malignant neoplasm of breast   . Occlusion and stenosis of carotid artery without mention of cerebral infarction   . Chronic airway obstruction, not elsewhere classified   . Depressive disorder, not elsewhere classified   . Other dyspnea and respiratory abnormality   . Esophageal reflux   . Unspecified hearing loss   . Other and unspecified hyperlipidemia   . Unspecified essential hypertension   . Osteoporosis, unspecified   . Peripheral vascular disease, unspecified ([place])   . Unspecified urinary incontinence   . Spinal stenosis   . ACE-inhibitor cough   . Angina   . Heart murmur     aS CHILD  . Shortness of breath   . Recurrent upper respiratory infection (URI)   . Anxiety   . Pneumonia   . 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 Myocardial infarct ([place])   . Osteoarthrosis, unspecified whether generalized or localized, unspecified site   . Malignant neoplasm of breast (female), unspecified site   . Compression fracture of T12 vertebra ([place]) 10/28/2011  . CHF (congestive heart failure) [facility])     Past Surgical History  Procedure Laterality Date  . Mastectomy  1987    Right  . Breast reconstruction  1998    Reconstruction   . Reduction mammaplasty  1998    Left  . Mastoidectomy  childhood  . Appendectomy  1948  . Lumbar laminectomy  2003  . Shoulder surgery  02/2005    Bilateral fractures with multiple surgeries  . Breast implant removal  06/12/09    right  . Abdominal hysterectomy    . Fracture surgery      fracture right elbow  . Coronary angioplasty  11/12    distal RCA  . Mastectomy    . Left heart catheterization with coronary angiogram N/A 06/05/2011    Procedure: LEFT HEART CATHETERIZATION WITH CORONARY ANGIOGRAM;  Surgeon: Clent Demark, MD;  Location: [facility] CATH LAB;  Service: Cardiovascular;  Laterality: N/A;  . Left heart  catheterization with coronary angiogram N/A 03/05/2014    Procedure: LEFT HEART CATHETERIZATION WITH CORONARY ANGIOGRAM;  Surgeon: Clent Demark, MD;  Location: [facility] CATH LAB;  Service: Cardiovascular;  Laterality: N/A;  . Flexible sigmoidoscopy N/A 01/21/2015    Procedure: FLEXIBLE SIGMOIDOSCOPY;  Surgeon: Richmond Campbell, MD;  Location: [facility] ENDOSCOPY;  Service: Endoscopy;  Laterality: N/A;  . Flexible sigmoidoscopy  01/22/2015       . Cardiac catheterization N/A 01/25/2015    Procedure: Left Heart Cath and Coronary Angiography;  Surgeon: Charolette Forward, MD;  Location: [place] CV LAB;  Service: Cardiovascular;  Laterality: N/A;    Family History  Problem Relation Age of Onset  . Heart attack Father 78  . Cancer Mother     ovarian  . Cancer Brother     lung  . Arthritis      family    Social History   Social History  . Marital Status: Widowed    Spouse Name: N/A  . Number of Children: 2  . Years of Education: N/A   Occupational History  . 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     Social History Main Topics  . Smoking status: Former Smoker    Quit date: 07/13/1974  . Smokeless tobacco: Never Used  . Alcohol Use: 0.0 oz/week    0 Standard drinks or equivalent per week     Comment: occasional  . Drug Use: No  . Sexual Activity: No   Other Topics Concern  . Not on file   Social History Narrative   Has living will   Son or daughter would be health care POA.   Requests DNR--written 03/02/13   No tube feeds if cognitively unaware   Review of Systems Right rib bruising No fever Legs twitch during sleep at times Appetite is okay--some trouble with tongue sores from recent antibiotic    Objective:   Physical Exam  Constitutional: She is oriented to person, place, and time. She appears well-developed. No distress.  Neck: Normal range of motion. No thyromegaly present.  Cardiovascular: Normal rate, regular rhythm and normal heart sounds.  Exam reveals no  gallop.   No murmur heard. Pulmonary/Chest: Effort normal and breath sounds normal. No respiratory distress. She has no wheezes. She has no rales.  Lymphadenopathy:    She has no cervical adenopathy.  Neurological: She is alert and oriented to person, place, and time. No cranial nerve deficit.  Left leg weakness related to hip problems---nothing new though  Skin:  Circular wound on left forehead Clean and dry Extensive left periorbital contusion   Psychiatric: She has a normal mood and affect. Her behavior is normal.          Assessment & Plan:

## 2015-10-29 NOTE — Progress Notes (Signed)
Pre visit review using our clinic review tool, if applicable. No additional management support is needed unless otherwise documented below in the visit note. 

## 2015-10-29 NOTE — Assessment & Plan Note (Signed)
Weight seems to be up some--left shoes and sweater on though Breathing is about the same Seems to be compensated

## 2015-10-29 NOTE — Telephone Encounter (Signed)
Spoke to patient during office visit. She wants to call the patient assistance to see if they can help before she gets it.

## 2015-10-29 NOTE — Assessment & Plan Note (Signed)
Wound is okay Daughter thinks she may have mild slowing of thoughts--but no clear cut concussion Discussed safety

## 2015-10-29 NOTE — Telephone Encounter (Signed)
I have rec'd Ms. Schmehl insurance verification.  W/out an OV she will have an estimated responsibility of $195; w/an OV she will have an estimated responsibility of $200.  Please make pt aware this is an estimate and we will not know an exact amt until insurance(s) has/have paid.  I have sent a copy of the summary of benefits to be scanned into pt's chart.    If pt cannot afford $195-$200  for her injection, please advise her to contact [employer] at [phone] and select option #1 to see if she qualifies for one of their assistance programs.  If she qualifies they will instruct her how to proceed.  If you have any questions, please let me know. Thank you.  Once pt recs injection, please let me know actual injection date so I can update the [employer] portal.  If you have any questions, please let me know.  Thank you!  [id number] Gina Rippo-Purgason for ordering purposes

## 2015-10-29 NOTE — ED Notes (Signed)
Sent by MD for NA 117 this am as well as other abnormal labs.  Pt denies any distress, black eye from recent fall

## 2015-10-29 NOTE — Assessment & Plan Note (Signed)
Will recheck Wants to stop potassium--will decide with labs

## 2015-10-29 NOTE — ED Provider Notes (Signed)
CSN: [id number]     Arrival date & time 10/29/15  1601 History   First MD Initiated Contact with Patient 10/29/15 1958     Chief Complaint  Patient presents with  . Abnormal Lab   HPI  The patient is an 82 year old white female PMH of HTN, COPD, CHF, depression, PVD, osteoporosis and GERD who presents due to low Na levels. She was seen in the ED after a fall from sitting on 4/15. She has been experiencing increased weakness with difficulty ambulating over the past few weeks, as well as lightheadedness and some forgetfulness. The patient was having a bowel movement on 4/15 when she loss consciousness, fell and hit her head on the floor. The patient was seen for a forehead laceration and then discharged, at the visit patient has low Na level and was told to f/u with PCP in 2-3 days. This morning, patient followed up with doctor who rechecked Na levels, 121, and was told to go to ED. She is currently alert and oriented, does endorse increased forgetfulness, fatigue, SOB, and weakness. Denies headaches, visual changes or numbness or tingling in extremities.   Past Medical History  Diagnosis Date  . Allergic rhinitis, cause unspecified   . Unspecified cardiovascular disease   . Personal history of malignant neoplasm of breast   . Occlusion and stenosis of carotid artery without mention of cerebral infarction   . Chronic airway obstruction, not elsewhere classified   . Depressive disorder, not elsewhere classified   . Other dyspnea and respiratory abnormality   . Esophageal reflux   . Unspecified hearing loss   . Other and unspecified hyperlipidemia   . Unspecified essential hypertension   . Osteoporosis, unspecified   . Peripheral vascular disease, unspecified ([place])   . Unspecified urinary incontinence   . Spinal stenosis   . ACE-inhibitor cough   . Angina   . Heart murmur     aS CHILD  . Shortness of breath   . Recurrent upper respiratory infection (URI)   . Anxiety   . Pneumonia   .  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 Myocardial infarct ([place])   . Osteoarthrosis, unspecified whether generalized or localized, unspecified site   . Malignant neoplasm of breast (female), unspecified site   . Compression fracture of T12 vertebra ([place]) 10/28/2011  . CHF (congestive heart failure) [facility])    Past Surgical History  Procedure Laterality Date  . Mastectomy  1987    Right  . Breast reconstruction  1998    Reconstruction   . Reduction mammaplasty  1998    Left  . Mastoidectomy  childhood  . Appendectomy  1948  . Lumbar laminectomy  2003  . Shoulder surgery  02/2005    Bilateral fractures with multiple surgeries  . Breast implant removal  06/12/09    right  . Abdominal hysterectomy    . Fracture surgery      fracture right elbow  . Coronary angioplasty  11/12    distal RCA  . Mastectomy    . Left heart catheterization with coronary angiogram N/A 06/05/2011    Procedure: LEFT HEART CATHETERIZATION WITH CORONARY ANGIOGRAM;  Surgeon: Clent Demark, MD;  Location: [facility] CATH LAB;  Service: Cardiovascular;  Laterality: N/A;  . Left heart catheterization with coronary angiogram N/A 03/05/2014    Procedure: LEFT HEART CATHETERIZATION WITH CORONARY ANGIOGRAM;  Surgeon: Clent Demark, MD;  Location: [facility] CATH LAB;  Service: Cardiovascular;  Laterality: N/A;  .  Flexible sigmoidoscopy N/A 01/21/2015    Procedure: FLEXIBLE SIGMOIDOSCOPY;  Surgeon: Richmond Campbell, MD;  Location: [facility] ENDOSCOPY;  Service: Endoscopy;  Laterality: N/A;  . Flexible sigmoidoscopy  01/22/2015       . Cardiac catheterization N/A 01/25/2015    Procedure: Left Heart Cath and Coronary Angiography;  Surgeon: Charolette Forward, MD;  Location: [place] CV LAB;  Service: Cardiovascular;  Laterality: N/A;   Family History  Problem Relation Age of Onset  . Heart attack Father 15  . Cancer Mother     ovarian  . Cancer Brother     lung  . Arthritis      family   Social History  Substance Use  Topics  . Smoking status: Former Smoker    Quit date: 07/13/1974  . Smokeless tobacco: Never Used  . Alcohol Use: 0.0 oz/week    0 Standard drinks or equivalent per week     Comment: occasional   OB History    No data available     Review of Systems All pertinent ROS as above in HPI  Allergies  Amoxicillin-pot clavulanate; Cephalexin; Clarithromycin; Lidocaine; Nifedipine; Nsaids; Olmesartan medoxomil; Penicillins; and Tramadol hcl  Home Medications   Prior to Admission medications   Medication Sig Start Date End Date Taking? Authorizing Provider  acetaminophen (TYLENOL) 325 MG tablet Take 2 tablets (650 mg total) by mouth every 4 (four) hours as needed for headache or mild pain. 01/27/15   Bonnielee Haff, MD  albuterol (PROVENTIL HFA;VENTOLIN HFA) 108 (90 Base) MCG/ACT inhaler Inhale 2 puffs into the lungs every 6 (six) hours as needed for wheezing or shortness of breath. 10/09/15   Jonetta Osgood, MD  albuterol (PROVENTIL) (2.5 MG/3ML) 0.083% nebulizer solution Take 3 mLs (2.5 mg total) by nebulization every 6 (six) hours as needed for wheezing or shortness of breath. 02/18/15   Venia Carbon, MD  aspirin 81 MG tablet Take 81 mg by mouth daily after lunch.     Historical Provider, MD  Calcium Carbonate-Vitamin D (CALTRATE 600+D PO) Take 1 tablet by mouth daily after lunch.     Historical Provider, MD  carvedilol (COREG) 12.5 MG tablet Take 1 tablet (12.5 mg total) by mouth 2 (two) times daily with a meal. 09/13/15   Venia Carbon, MD  denosumab (PROLIA) 60 MG/ML SOLN injection Inject 60 mg into the skin every 6 (six) months. Administer in upper arm, thigh, or abdomen    Historical Provider, MD  escitalopram (LEXAPRO) 10 MG tablet Take 1 tablet by mouth  daily 08/26/15   Venia Carbon, MD  famotidine (PEPCID) 20 MG tablet Take 1 tablet (20 mg total) by mouth 2 (two) times daily. 09/13/15   Venia Carbon, MD  furosemide (LASIX) 40 MG tablet Take 1 tablet (40 mg total) by mouth 2  (two) times daily. 10/09/15   Shanker Kristeen Mans, MD  gabapentin (NEURONTIN) 100 MG capsule Take 1 capsule (100 mg total) by mouth 2 (two) times daily. 07/23/14   Venia Carbon, MD  levocetirizine (XYZAL) 5 MG tablet Take 5 mg by mouth every evening.    Historical Provider, MD  losartan (COZAAR) 50 MG tablet TAKE 1 TABLET BY MOUTH DAILY 09/13/15   Venia Carbon, MD  montelukast (SINGULAIR) 10 MG tablet TAKE 1 TABLET BY MOUTH DAILY 09/13/15   Venia Carbon, MD  Multiple Vitamin (MULITIVITAMIN WITH MINERALS) TABS Take 1 tablet by mouth daily after lunch.     Historical Provider, MD  oxyCODONE-acetaminophen (PERCOCET/ROXICET)  5-325 MG per tablet Take 1-2 tablets by mouth every 4 (four) hours as needed. 01/28/15   Bonnielee Haff, MD  potassium chloride SA (K-DUR,KLOR-CON) 20 MEQ tablet Take 1 tablet (20 mEq total) by mouth 2 (two) times daily. 10/09/15   Shanker Kristeen Mans, MD  pravastatin (PRAVACHOL) 80 MG tablet Take 1 tablet by mouth  daily 05/09/15   Venia Carbon, MD  spironolactone (ALDACTONE) 25 MG tablet Take 1 tablet by mouth  daily 08/26/15   Venia Carbon, MD  Sulfamethoxazole-Trimethoprim (BACTRIM PO) Take 1 tablet by mouth 2 (two) times daily.    Historical Provider, MD  vitamin B-12 (CYANOCOBALAMIN) 500 MCG tablet Take 1,000 mcg by mouth 3 (three) times a week. Mon, Wed, Fri    Historical Provider, MD   BP 136/66 mmHg  Pulse 63  Temp(Src) 98.8 F (37.1 C) (Oral)  Resp 12  SpO2 99% Physical Exam  Constitutional: She is oriented to person, place, and time. She appears well-developed and well-nourished.  HENT:  Head: Normocephalic and atraumatic.  Eyes: EOM are normal. Pupils are equal, round, and reactive to light.  Neck: Neck supple. No JVD present.  Cardiovascular: Normal rate, regular rhythm, normal heart sounds and intact distal pulses.  Exam reveals no gallop and no friction rub.   No murmur heard. 2+ pitting edema with tenderness to palpation  Pulmonary/Chest:   Increased work of breathing, mild expiratory wheezes over upper lung fields. Decreased inspiration, good air movement  Abdominal: Soft. Bowel sounds are normal.  Non-tender to palpation over all quadrants, distension, dullness to percussion. Midline abdominal hernia noted.  Lymphadenopathy:    She has no cervical adenopathy.  Neurological: She is alert and oriented to person, place, and time. No cranial nerve deficit.  Skin: Skin is warm and dry.  Left sided periorbital ecchymoses in different stages of healing. U shaped laceration with sutures placed over left forehead.     ED Course  Procedures (including critical care time) Labs Review Labs Reviewed  CBC - Abnormal; Notable for the following:    RBC 2.74 (*)    Hemoglobin 9.1 (*)    HCT 26.0 (*)    All other components within normal limits  COMPREHENSIVE METABOLIC PANEL - Abnormal; Notable for the following:    Sodium 121 (*)    Potassium 5.3 (*)    Chloride 90 (*)    CO2 21 (*)    BUN 27 (*)    Creatinine, Ser 1.69 (*)    Calcium 8.3 (*)    Total Protein 5.8 (*)    Albumin 3.3 (*)    GFR calc non Af Amer 27 (*)    GFR calc Af Amer 31 (*)    All other components within normal limits  URINALYSIS, ROUTINE W REFLEX MICROSCOPIC (NOT AT [facility]) - Abnormal; Notable for the following:    Leukocytes, UA SMALL (*)    All other components within normal limits  URINE MICROSCOPIC-ADD ON - Abnormal; Notable for the following:    Squamous Epithelial / LPF 0-5 (*)    Bacteria, UA FEW (*)    All other components within normal limits    Dg Chest 2 View  10/29/2015  CLINICAL DATA:  Shortness of breath for 4 days. EXAM: CHEST  2 VIEW COMPARISON:  Radiographs most recently 10/26/2015, CT 09/25/2015 FINDINGS: Cardiomegaly and tortuous thoracic aorta unchanged. Small bilateral pleural effusions are unchanged. Linear atelectasis or scarring at the right lung base again seen. No pulmonary edema or consolidation. Compression deformities  in the  and lower midthoracic spine and exaggerated thoracic kyphosis are unchanged. IMPRESSION: 1. No acute abnormality. 2. Stable cardiomegaly and small pleural effusions. Unchanged atelectasis or scarring in the right lower lobe. Electronically Signed   By: Jeb Levering M.D.   On: 10/29/2015 20:53   I have personally reviewed and evaluated these images and lab results as part of my medical decision-making.   MDM   The patient is presenting with s&s of hyponatremia, which has likely been going on for a few weeks now. She has hx of CHF and is possibly in fluid overload with peripheral edema and abdominal ascites which is causing low Na levels. CXR shows no acute changes, small unchanged bilateral pleural effusions. Consider replenishing Na vs avoiding additional fluid overload in patient. Will admit patient to observation to slowly correct Na levels and watch for fluid intake and overload.  Dalia Heading, PA-C 10/31/15 [place], MD 10/31/15 2025

## 2015-10-29 NOTE — Assessment & Plan Note (Signed)
Was worse in ER  Will recheck today

## 2015-10-29 NOTE — H&P (Addendum)
History and Physical    Haley Harris [id number] DOB: 1929-01-17 DOA: 10/29/2015  Referring MD/NP/PA:   PCP: [name], MD   Outpatient Specialists: none  Patient coming from:  Home Chief Complaint: generalized weakness  HPI: Haley Harris is a 82 y.o. female with medical history significant of chronic hyponatremia (baseline of sodium is 125-130 recently), hypertension, hyperlipidemia, GERD, depression, anxiety, hearing loss, PVD, breast cancer (S/P mastectomy), chronic combined systolic and diastolic congestive heart failure, chronic kidney disease-stage III, who presents with generalized weakness.  Patient reports that she has been having generalized weakness in the past 2 weeks, no unilateral weakness, vision change or hearing loss. She also has body shakiness sometimes, but no fever or chills. Patient has mild cough and shortness of breath due to COPD, which are at her baseline. Patient does not have chest pain, abdominal pain, nausea, vomiting, symptoms of UTI. She had fall recently causing bruise over left periorbital area. She had negative CT-head and c spin for acute intracranial abnormalities after that fall.   ED Course: pt was found to have sodium 121, potassium 5.3 without EKG change, worsening renal function, positive urinalysis with small amount of leukocytes, WBC 6.8, temperature normal, negative chest x-ray. Patient is admitted to inpatient for further eval and treatment and observation.  Can patient participate in ADLs?  Little  Review of Systems:   General: no fevers, chills, no changes in body weight, has fatigue HEENT: no blurry vision, hearing changes or sore throat Pulm: has mild dyspnea, coughing, no wheezing CV: no chest pain, no palpitations Abd: no nausea, vomiting, abdominal pain, diarrhea, constipation GU: no dysuria, burning on urination, increased urinary frequency, hematuria  Ext: no leg edema Neuro: no unilateral weakness, numbness, or tingling,  no vision change or hearing loss Skin: has periorbital bruise over left eye. MSK: No muscle spasm, no deformity, no limitation of range of movement in spin Heme: No easy bruising.  Travel history: No recent long distant travel.  Allergy:  Allergies  Allergen Reactions  . Amoxicillin-Pot Clavulanate Anaphylaxis  . Cephalexin Other (See Comments)    Reaction unknown  . Clarithromycin Other (See Comments)    Reaction unknown  . Lidocaine     Patient had tremors following use of lidocaine pain patches  . Nifedipine Other (See Comments)    Reaction unknown  . Nsaids Other (See Comments)    Heart issue  . Olmesartan Medoxomil     REACTION: cough;  But tolerating Losartan 02/2014  . Penicillins   . Tramadol Hcl Other (See Comments)    Reaction unknown    Past Medical History  Diagnosis Date  . Allergic rhinitis, cause unspecified   . Unspecified cardiovascular disease   . Personal history of malignant neoplasm of breast   . Occlusion and stenosis of carotid artery without mention of cerebral infarction   . Chronic airway obstruction, not elsewhere classified   . Depressive disorder, not elsewhere classified   . Other dyspnea and respiratory abnormality   . Esophageal reflux   . Unspecified hearing loss   . Other and unspecified hyperlipidemia   . Unspecified essential hypertension   . Osteoporosis, unspecified   . Peripheral vascular disease, unspecified ([place])   . Unspecified urinary incontinence   . Spinal stenosis   . ACE-inhibitor cough   . Angina   . Heart murmur     aS CHILD  . Shortness of breath   . Recurrent upper respiratory infection (URI)   . Anxiety   .  Pneumonia   . 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 Myocardial infarct ([place])   . Osteoarthrosis, unspecified whether generalized or localized, unspecified site   . Malignant neoplasm of breast (female), unspecified site   . Compression fracture of T12 vertebra ([place]) 10/28/2011  . CHF  (congestive heart failure) [facility])     Past Surgical History  Procedure Laterality Date  . Mastectomy  1987    Right  . Breast reconstruction  1998    Reconstruction   . Reduction mammaplasty  1998    Left  . Mastoidectomy  childhood  . Appendectomy  1948  . Lumbar laminectomy  2003  . Shoulder surgery  02/2005    Bilateral fractures with multiple surgeries  . Breast implant removal  06/12/09    right  . Abdominal hysterectomy    . Fracture surgery      fracture right elbow  . Coronary angioplasty  11/12    distal RCA  . Mastectomy    . Left heart catheterization with coronary angiogram N/A 06/05/2011    Procedure: LEFT HEART CATHETERIZATION WITH CORONARY ANGIOGRAM;  Surgeon: Clent Demark, MD;  Location: [facility] CATH LAB;  Service: Cardiovascular;  Laterality: N/A;  . Left heart catheterization with coronary angiogram N/A 03/05/2014    Procedure: LEFT HEART CATHETERIZATION WITH CORONARY ANGIOGRAM;  Surgeon: Clent Demark, MD;  Location: [facility] CATH LAB;  Service: Cardiovascular;  Laterality: N/A;  . Flexible sigmoidoscopy N/A 01/21/2015    Procedure: FLEXIBLE SIGMOIDOSCOPY;  Surgeon: Richmond Campbell, MD;  Location: [facility] ENDOSCOPY;  Service: Endoscopy;  Laterality: N/A;  . Flexible sigmoidoscopy  01/22/2015       . Cardiac catheterization N/A 01/25/2015    Procedure: Left Heart Cath and Coronary Angiography;  Surgeon: Charolette Forward, MD;  Location: [facility] CV LAB;  Service: Cardiovascular;  Laterality: N/A;    Social History:  reports that she quit smoking about 41 years ago. She has never used smokeless tobacco. She reports that she drinks alcohol. She reports that she does not use illicit drugs.  Family History:  Family History  Problem Relation Age of Onset  . Heart attack Father 77  . Cancer Mother     ovarian  . Cancer Brother     lung  . Arthritis      family     Prior to Admission medications   Medication Sig Start Date End Date Taking? Authorizing Provider    acetaminophen (TYLENOL) 325 MG tablet Take 2 tablets (650 mg total) by mouth every 4 (four) hours as needed for headache or mild pain. 01/27/15  Yes Bonnielee Haff, MD  albuterol (PROVENTIL HFA;VENTOLIN HFA) 108 (90 Base) MCG/ACT inhaler Inhale 2 puffs into the lungs every 6 (six) hours as needed for wheezing or shortness of breath. 10/09/15  Yes Shanker Kristeen Mans, MD  albuterol (PROVENTIL) (2.5 MG/3ML) 0.083% nebulizer solution Take 3 mLs (2.5 mg total) by nebulization every 6 (six) hours as needed for wheezing or shortness of breath. 02/18/15  Yes Venia Carbon, MD  aspirin 81 MG tablet Take 81 mg by mouth daily after lunch.    Yes Historical Provider, MD  Calcium Carbonate-Vitamin D (CALTRATE 600+D PO) Take 1 tablet by mouth daily after lunch.    Yes Historical Provider, MD  carvedilol (COREG) 12.5 MG tablet Take 1 tablet (12.5 mg total) by mouth 2 (two) times daily with a meal. 09/13/15  Yes Venia Carbon, MD  denosumab (PROLIA) 60 MG/ML SOLN injection Inject  60 mg into the skin every 6 (six) months. Administer in upper arm, thigh, or abdomen   Yes Historical Provider, MD  escitalopram (LEXAPRO) 10 MG tablet Take 1 tablet by mouth  daily 08/26/15  Yes Venia Carbon, MD  famotidine (PEPCID) 20 MG tablet Take 1 tablet (20 mg total) by mouth 2 (two) times daily. 09/13/15  Yes Venia Carbon, MD  furosemide (LASIX) 40 MG tablet Take 1 tablet (40 mg total) by mouth 2 (two) times daily. 10/09/15  Yes Shanker Kristeen Mans, MD  gabapentin (NEURONTIN) 100 MG capsule Take 1 capsule (100 mg total) by mouth 2 (two) times daily. 07/23/14  Yes Venia Carbon, MD  levocetirizine (XYZAL) 5 MG tablet Take 5 mg by mouth every evening.   Yes Historical Provider, MD  losartan (COZAAR) 50 MG tablet TAKE 1 TABLET BY MOUTH DAILY 09/13/15  Yes Venia Carbon, MD  montelukast (SINGULAIR) 10 MG tablet TAKE 1 TABLET BY MOUTH DAILY 09/13/15  Yes Venia Carbon, MD  Multiple Vitamin (MULITIVITAMIN WITH MINERALS) TABS Take 1  tablet by mouth daily after lunch.    Yes Historical Provider, MD  oxyCODONE-acetaminophen (PERCOCET/ROXICET) 5-325 MG per tablet Take 1-2 tablets by mouth every 4 (four) hours as needed. 01/28/15  Yes Bonnielee Haff, MD  potassium chloride SA (K-DUR,KLOR-CON) 20 MEQ tablet Take 1 tablet (20 mEq total) by mouth 2 (two) times daily. 10/09/15  Yes Shanker Kristeen Mans, MD  pravastatin (PRAVACHOL) 80 MG tablet Take 1 tablet by mouth  daily 05/09/15  Yes Venia Carbon, MD  spironolactone (ALDACTONE) 25 MG tablet Take 1 tablet by mouth  daily 08/26/15  Yes Venia Carbon, MD  sulfamethoxazole-trimethoprim (BACTRIM DS,SEPTRA DS) 800-160 MG tablet Take 1 tablet by mouth 2 (two) times daily. 10/21/15  Yes Historical Provider, MD  vitamin B-12 (CYANOCOBALAMIN) 500 MCG tablet Take 1,000 mcg by mouth 3 (three) times a week. Mon, Wed, Fri   Yes Historical Provider, MD    Physical Exam: Filed Vitals:   10/29/15 2017 10/29/15 2030 10/29/15 2030 10/30/15 0224  BP:  136/66 136/66 128/62  Pulse:  63 63 66  Temp:      TempSrc:      Resp:  13 12 18   SpO2: 99% 99% 99% 99%   General: Not in acute distress HEENT:       Eyes: PERRL, EOMI, no scleral icterus.       ENT: No discharge from the ears and nose, no pharynx injection, no tonsillar enlargement.        Neck: No JVD, no bruit, no mass felt. Heme: No neck lymph node enlargement. Cardiac: S1/S2, RRR, No murmurs, No gallops or rubs. Pulm:  No rales, wheezing, rhonchi or rubs. Abd: Soft, nondistended, nontender, no rebound pain, no organomegaly, BS present. GU: No hematuria Ext: 1+ pitting leg edema bilaterally. 2+DP/PT pulse bilaterally. Musculoskeletal: No joint deformities, No joint redness or warmth, no limitation of ROM in spin. Skin: has bruise over left periorbital area. Neuro: Alert, oriented X3, cranial nerves II-XII grossly intact, moves all extremities normally. Psych: Patient is not psychotic, no suicidal or hemocidal ideation.  Labs on  Admission: I have personally reviewed following labs and imaging studies  CBC:  Recent Labs Lab 10/26/15 0845 10/29/15 1715 10/30/15 0148  WBC 5.7 6.8 7.2  NEUTROABS 4.5  --   --   HGB 9.6* 9.1* 8.7*  HCT 27.4* 26.0* 25.6*  MCV 94.2 94.9 94.8  PLT 217 291 0000000   Basic Metabolic  Panel:  Recent Labs Lab 10/26/15 0845 10/29/15 1105 10/29/15 1715 10/30/15 0148  NA 125* 118* 121* 122*  K 4.7 5.1 5.3* 4.8  CL 92* 88* 90* 91*  CO2 21* 26 21* 21*  GLUCOSE 88 106* 89 106*  BUN 17 27* 27* 29*  CREATININE 1.36* 1.78* 1.69* 1.71*  CALCIUM 8.8* 8.4 8.3* 8.3*  PHOS  --  4.1  --   --    GFR: Estimated Creatinine Clearance: 26.1 mL/min (by C-G formula based on Cr of 1.71). Liver Function Tests:  Recent Labs Lab 10/26/15 0845 10/29/15 1105 10/29/15 1715  AST 21  --  19  ALT 20  --  16  ALKPHOS 53  --  54  BILITOT 0.9  --  0.3  PROT 6.5  --  5.8*  ALBUMIN 3.4* 3.5 3.3*   No results for input(s): LIPASE, AMYLASE in the last 168 hours. No results for input(s): AMMONIA in the last 168 hours. Coagulation Profile: No results for input(s): INR, PROTIME in the last 168 hours. Cardiac Enzymes: No results for input(s): CKTOTAL, CKMB, CKMBINDEX, TROPONINI in the last 168 hours. BNP (last 3 results) No results for input(s): PROBNP in the last 8760 hours. HbA1C: No results for input(s): HGBA1C in the last 72 hours. CBG: No results for input(s): GLUCAP in the last 168 hours. Lipid Profile: No results for input(s): CHOL, HDL, LDLCALC, TRIG, CHOLHDL, LDLDIRECT in the last 72 hours. Thyroid Function Tests:  Recent Labs  10/30/15 0148  TSH 0.673   Anemia Panel: No results for input(s): VITAMINB12, FOLATE, FERRITIN, TIBC, IRON, RETICCTPCT in the last 72 hours. Urine analysis:    Component Value Date/Time   COLORURINE YELLOW 10/29/2015 1849   APPEARANCEUR CLEAR 10/29/2015 1849   LABSPEC 1.012 10/29/2015 1849   PHURINE 6.0 10/29/2015 1849   GLUCOSEU NEGATIVE 10/29/2015 1849     HGBUR NEGATIVE 10/29/2015 1849   BILIRUBINUR NEGATIVE 10/29/2015 1849   BILIRUBINUR neg 08/05/2011 1119   KETONESUR NEGATIVE 10/29/2015 1849   KETONESUR negative 10/23/2011 1633   PROTEINUR NEGATIVE 10/29/2015 1849   PROTEINUR neg 10/23/2011 1633   UROBILINOGEN 1.0 01/18/2015 1312   UROBILINOGEN negative 10/23/2011 1633   NITRITE NEGATIVE 10/29/2015 1849   NITRITE neg 10/23/2011 1633   LEUKOCYTESUR SMALL* 10/29/2015 1849   Sepsis Labs: @LABRCNTIP (procalcitonin:4,lacticidven:4) )No results found for this or any previous visit (from the past 240 hour(s)).   Radiological Exams on Admission: Dg Chest 2 View  10/29/2015  CLINICAL DATA:  Shortness of breath for 4 days. EXAM: CHEST  2 VIEW COMPARISON:  Radiographs most recently 10/26/2015, CT 09/25/2015 FINDINGS: Cardiomegaly and tortuous thoracic aorta unchanged. Small bilateral pleural effusions are unchanged. Linear atelectasis or scarring at the right lung base again seen. No pulmonary edema or consolidation. Compression deformities in the and lower midthoracic spine and exaggerated thoracic kyphosis are unchanged. IMPRESSION: 1. No acute abnormality. 2. Stable cardiomegaly and small pleural effusions. Unchanged atelectasis or scarring in the right lower lobe. Electronically Signed   By: Jeb Levering M.D.   On: 10/29/2015 20:53     EKG: Independently reviewed.  Not done in ED, will get one.   Assessment/Plan Principal Problem:   Hyponatremia Active Problems:   Dyslipidemia   COPD (chronic obstructive pulmonary disease) (HCC)   GERD   Osteoarthritis   Osteoporosis   Chronic combined systolic and diastolic CHF, NYHA class 1 (HCC)   Acute renal failure superimposed on stage 3 chronic kidney disease (HCC)   PVD (peripheral vascular disease) ([place])  Acute hyperkalemia   Fall with injury   Generalized weakness   Essential hypertension   Hyponatremia: patient has chronic hyponatremia with sodium 125-130 at a baseline. Today  her sodium is 121, which is slightly worsening than her baseline. Patient's mental status is normal. Likely due to multifactorals, including diuretics, CHF and decreased oral intake.  -will admit to tele bed for OBSERVATION -IV fluid: normal saline 100 mL per hour -hold losartan, Lasix and spironolactone -Will check urine sodium, urine osmolality, serum osmolality and TSH -BMP q6h  Hyperkalemia: K=5.3 without ekg change. Likely due to potassium supplement. Pt is on spironolactone and losartan, which increase the risk of developing hyperkalemia. -hold potassium supplement, spironolactone and losartan -Kayexalate 30 g 1  AoCKD-III: Baseline Cre is 1.2, her Cre is 1.69 on admission. Likely due to prerenal secondary to dehydration and continuation of ARB, diruetics - IVF as above - Check FeUrea - Follow up renal function by BMP - Hold Diuretics and losartan  Chronic combined systolic and diastolic CHF: 2-D echo on 01/19/15 showed EF of 40-45% with grade 1 diastolic dysfunction. Patient has a 1+ bilateral leg edema, indicating fluid overloaded. Given her worsening hyponatremia and a renal function, will need to hold diuretics. -hold Lasix, spironolactone -Check BNP -Continue aspirin and Coreg -Need to resume diuretics as soon as hyponatremia improves.  HLD: Last LDL was 55 on 04/03/14 -Continue home medications: pravastatin  GERD: -Pepcid  HTN:  -Coreg -hold lasix, losartan and spironolactone as above -IV hydralazine prn  COPD (chronic obstructive pulmonary disease) ([place]): stable - prn albuterol nebs, Singulair,  Depression: Stable, no suicidal or homicidal ideations. -Continue home medications: Lexapro   DVT ppx:  SQ Lovenox Code Status: partial code (OK with intubation, but not CPR) Family Communication: Yes, patient's daughter and granddaughter at bed side Disposition Plan:  Anticipate discharge back to previous home environment Consults called: none Admission status:   obs  / tele   Date of Service 10/30/2015    Ivor Costa [facility] Pager [phone]  If 7PM-7AM, please contact night-coverage [email or web address] Password [email or web address] 10/30/2015, 3:07 AM

## 2015-10-29 NOTE — ED Provider Notes (Signed)
  Face-to-face evaluation   History: She is here after routine testing revealed sodium 117. In ED, 2 days ago, found hyponatremia recommended repeat testing today.She had been evaluated then for injury from fall.  Physical exam:Alert, elderly female. Cooperative. Moves all extremities easily   Prior records indicate recurrent hyponatremia, ongoing for At least 9 months.   Medical screening examination/treatment/procedure(s) were conducted as a shared visit with non-physician practitioner(s) and myself.  I personally evaluated the patient during the encounter  Daleen Bo, MD 10/31/15 2025

## 2015-10-29 NOTE — Telephone Encounter (Signed)
I informed Dr. Damita Dunnings of result and he advised pt go the the ER ASAP. I called pt, notified her of result and instructions. She states she will go to ER now.

## 2015-10-29 NOTE — ED Notes (Signed)
MD at bedside. 

## 2015-10-30 ENCOUNTER — Other Ambulatory Visit: Payer: Self-pay | Admitting: *Deleted

## 2015-10-30 ENCOUNTER — Ambulatory Visit: Payer: Self-pay | Admitting: Internal Medicine

## 2015-10-30 DIAGNOSIS — N179 Acute kidney failure, unspecified: Secondary | ICD-10-CM | POA: Diagnosis not present

## 2015-10-30 DIAGNOSIS — J439 Emphysema, unspecified: Secondary | ICD-10-CM | POA: Diagnosis not present

## 2015-10-30 DIAGNOSIS — I5042 Chronic combined systolic (congestive) and diastolic (congestive) heart failure: Secondary | ICD-10-CM | POA: Diagnosis not present

## 2015-10-30 DIAGNOSIS — E871 Hypo-osmolality and hyponatremia: Principal | ICD-10-CM

## 2015-10-30 DIAGNOSIS — I10 Essential (primary) hypertension: Secondary | ICD-10-CM | POA: Diagnosis present

## 2015-10-30 LAB — BASIC METABOLIC PANEL
ANION GAP: 10 (ref 5–15)
ANION GAP: 9 (ref 5–15)
ANION GAP: 11 (ref 5–15)
ANION GAP: 9 (ref 5–15)
BUN: 29 mg/dL — AB (ref 6–20)
BUN: 25 mg/dL — AB (ref 6–20)
BUN: 22 mg/dL — ABNORMAL HIGH (ref 6–20)
BUN: 24 mg/dL — ABNORMAL HIGH (ref 6–20)
CALCIUM: 8.3 mg/dL — AB (ref 8.9–10.3)
CALCIUM: 8.1 mg/dL — AB (ref 8.9–10.3)
CO2: 21 mmol/L — ABNORMAL LOW (ref 22–32)
CO2: 22 mmol/L (ref 22–32)
CO2: 22 mmol/L (ref 22–32)
CO2: 25 mmol/L (ref 22–32)
CREATININE: 1.45 mg/dL — AB (ref 0.44–1.00)
Calcium: 8.4 mg/dL — ABNORMAL LOW (ref 8.9–10.3)
Calcium: 8.3 mg/dL — ABNORMAL LOW (ref 8.9–10.3)
Chloride: 91 mmol/L — ABNORMAL LOW (ref 101–111)
Chloride: 94 mmol/L — ABNORMAL LOW (ref 101–111)
Chloride: 93 mmol/L — ABNORMAL LOW (ref 101–111)
Chloride: 91 mmol/L — ABNORMAL LOW (ref 101–111)
Creatinine, Ser: 1.71 mg/dL — ABNORMAL HIGH (ref 0.44–1.00)
Creatinine, Ser: 1.4 mg/dL — ABNORMAL HIGH (ref 0.44–1.00)
Creatinine, Ser: 1.56 mg/dL — ABNORMAL HIGH (ref 0.44–1.00)
GFR calc Af Amer: 30 mL/min — ABNORMAL LOW (ref >60)
GFR calc Af Amer: 37 mL/min — ABNORMAL LOW (ref >60)
GFR calc Af Amer: 39 mL/min — ABNORMAL LOW (ref >60)
GFR calc Af Amer: 34 mL/min — ABNORMAL LOW (ref >60)
GFR calc non Af Amer: 33 mL/min — ABNORMAL LOW (ref >60)
GFR, EST NON AFRICAN AMERICAN: 26 mL/min — AB (ref >60)
GFR, EST NON AFRICAN AMERICAN: 32 mL/min — AB (ref >60)
GFR, EST NON AFRICAN AMERICAN: 29 mL/min — AB (ref >60)
GLUCOSE: 106 mg/dL — AB (ref 65–99)
GLUCOSE: 93 mg/dL (ref 65–99)
GLUCOSE: 142 mg/dL — AB (ref 65–99)
GLUCOSE: 116 mg/dL — AB (ref 65–99)
POTASSIUM: 4.2 mmol/L (ref 3.5–5.1)
POTASSIUM: 4 mmol/L (ref 3.5–5.1)
Potassium: 4.8 mmol/L (ref 3.5–5.1)
Potassium: 4.6 mmol/L (ref 3.5–5.1)
Sodium: 122 mmol/L — ABNORMAL LOW (ref 135–145)
Sodium: 125 mmol/L — ABNORMAL LOW (ref 135–145)
Sodium: 126 mmol/L — ABNORMAL LOW (ref 135–145)
Sodium: 125 mmol/L — ABNORMAL LOW (ref 135–145)

## 2015-10-30 LAB — CBC
HEMATOCRIT: 25.6 % — AB (ref 36.0–46.0)
HEMOGLOBIN: 8.7 g/dL — AB (ref 12.0–15.0)
MCH: 32.2 pg (ref 26.0–34.0)
MCHC: 34 g/dL (ref 30.0–36.0)
MCV: 94.8 fL (ref 78.0–100.0)
Platelets: 293 10*3/uL (ref 150–400)
RBC: 2.7 MIL/uL — AB (ref 3.87–5.11)
RDW: 13.3 % (ref 11.5–15.5)
WBC: 7.2 10*3/uL (ref 4.0–10.5)

## 2015-10-30 LAB — OSMOLALITY: Osmolality: 261 mOsm/kg — ABNORMAL LOW (ref 275–295)

## 2015-10-30 LAB — TSH: TSH: 0.673 u[IU]/mL (ref 0.350–4.500)

## 2015-10-30 LAB — OSMOLALITY, URINE: OSMOLALITY UR: 286 mosm/kg — AB (ref 300–900)

## 2015-10-30 LAB — CREATININE, URINE, RANDOM: Creatinine, Urine: 53.68 mg/dL

## 2015-10-30 LAB — SODIUM, URINE, RANDOM: Sodium, Ur: 24 mmol/L

## 2015-10-30 LAB — BRAIN NATRIURETIC PEPTIDE: B NATRIURETIC PEPTIDE 5: 157 pg/mL — AB (ref 0.0–100.0)

## 2015-10-30 MED ORDER — FUROSEMIDE 10 MG/ML IJ SOLN
20.0000 mg | Freq: Two times a day (BID) | INTRAMUSCULAR | Status: DC
  Administered 2015-10-30 (×2): 20 mg via INTRAVENOUS
  Filled 2015-10-30 (×2): qty 2

## 2015-10-30 MED ORDER — PRAVASTATIN SODIUM 40 MG PO TABS
80.0000 mg | ORAL_TABLET | Freq: Every day | ORAL | Status: DC
  Administered 2015-10-31 – 2015-11-01 (×2): 80 mg via ORAL
  Filled 2015-10-30 (×2): qty 2

## 2015-10-30 NOTE — Care Management Obs Status (Signed)
[facility] NOTIFICATION   Patient Details  Name: Haley Harris MRN: [id number] Date of Birth: 08/05/1928   Medicare Observation Status Notification Given:  Yes    Carles Collet, RN 10/30/2015, 2:32 PM

## 2015-10-30 NOTE — Patient Outreach (Signed)
[facility]) Care Management  10/30/2015  AERALYN NEWBANKS 1928/07/16 [id number]  [name] is an 82 y/o female referred to [facility] for transition of care after recent IP hospital visit March 17-29, 2017 for ARF with hypoxia related to flu and CHF.  Noted today from EMR review that [name] had a fall on 10-26-15 and went to the ED, where she got 50 stitches to her forehead lac. She was found in the ED to be hyponatremic, which worsened after she was discharged home; she went to her PCP for F/U and her sodium level was found at that time to be 118, so she went to the ED on the advice of her PCP. She was admitted to 5W at [facility] for observation 10/29/15.   Secure communication sent to [facility] hospital liaisons informing of [name] admission yesterday for care coordination.  [facility] CM will continue following [name] progress and continue transition of care outreach upon her discharge.    Oneta Rack, RN, BSN, [facility] Care Management  [phone]

## 2015-10-30 NOTE — Progress Notes (Signed)
PROGRESS NOTE  Haley Harris [id number] DOB: [birth year] DOA: 10/29/2015 PCP: [name], MD Outpatient Specialists:      Brief Narrative: 82 y.o. female with medical history significant of chronic hyponatremia (baseline of sodium is 125-130 recently), hypertension, hyperlipidemia, GERD, depression, anxiety, hearing loss, PVD, breast cancer (S/P mastectomy), chronic combined systolic and diastolic congestive heart failure, chronic kidney disease-stage III, who was admitted on 4/18 with hyponatremia  Assessment & Plan: Principal Problem:   Hyponatremia Active Problems:   Dyslipidemia   COPD (chronic obstructive pulmonary disease) (HCC)   GERD   Osteoarthritis   Osteoporosis   Chronic combined systolic and diastolic CHF, NYHA class 1 (HCC)   Acute renal failure superimposed on stage 3 chronic kidney disease (HCC)   PVD (peripheral vascular disease) ([place])   Acute hyperkalemia   Fall with injury   Generalized weakness   Essential hypertension   Hyponatremia - Acute on chronic, sodium 1 admission was 121, patient looks fluid overloaded on exam with lower extremity edema, JVD and chest x-ray with cardiomegaly and small pleural effusions - Discontinue IV fluids, gentle diuresis to improve volume status - Continue to hold ARB and spironolactone  Hyperkalemia  - Improved, monitor while on Lasix  AoCKD-III  - Creatinine stable, improved this morning, continue to monitor  Acute on chronic combined systolic and diastolic CHF - 2-D echo on 01/19/15 showed EF of 40-45% with grade 1 diastolic dysfunction.  - Continue aspirin and Coreg  HLD  - last LDL was 55 on 04/03/14 - Continue home medications: pravastatin  GERD - Pepcid  HTN - Coreg - hold lasix, losartan and spironolactone as above - IV hydralazine prn  COPD (chronic obstructive pulmonary disease) (HCC)  - stable - prn albuterol nebs, Singulair,  Depression  - hold Lexapro as it can cause hyponatremia  Falls at  home / excessive sleepiness - Patient has been having excessive daytime sleepiness at home, the daughter states that she often falls asleep at random times. She'll fall last week while being on the commode, regarding an ED visit and stitches. ? Degree of obstructive sleep apnea versus chronic hypercarbic respiratory failure due to her COPD, may need additional studies as outpatient   DVT prophylaxis: Lovenox Code Status: Partial code Family Communication: patient's daughter bedside Disposition Plan: home when ready  Barriers for discharge: hyponatremia, diuresis  Consultants:   None   Procedures:   None   Antimicrobials:  None    Subjective: - Feels better this morning, no complaints, endorses bilateral lower extremity swelling significantly worse since last week, no chest pain, lightheadedness or dizziness.   Objective: Filed Vitals:   10/30/15 0515 10/30/15 0545 10/30/15 0600 10/30/15 0647  BP: 115/52 121/53 117/51 154/58  Pulse: 68 72 70 72  Temp:    97.9 F (36.6 C)  TempSrc:      Resp: 16 16 16 21   Height:    5\' 5"  (1.651 m)  Weight:    79.9 kg (176 lb 2.4 oz)  SpO2: 95% 96% 87% 99%    Intake/Output Summary (Last 24 hours) at 10/30/15 1206 Last data filed at 10/30/15 1140  Gross per 24 hour  Intake      0 ml  Output    700 ml  Net   -700 ml   Filed Weights   10/30/15 0647  Weight: 79.9 kg (176 lb 2.4 oz)    Examination: Constitutional: NAD Filed Vitals:   10/30/15 0515 10/30/15 0545 10/30/15 0600 10/30/15 0647  BP: 115/52 121/53  117/51 154/58  Pulse: 68 72 70 72  Temp:    97.9 F (36.6 C)  TempSrc:      Resp: 16 16 16 21   Height:    5\' 5"  (1.651 m)  Weight:    79.9 kg (176 lb 2.4 oz)  SpO2: 95% 96% 87% 99%   Eyes: PERRL, lids and conjunctivae normal ENMT: Mucous membranes are moist Respiratory: clear to auscultation bilaterally, no wheezing, no crackles.   Cardiovascular: Regular rate and rhythm, no murmurs / rubs / gallops. 1+ pitting LE  edema. + JVD Abdomen: no tenderness Musculoskeletal: no clubbing / cyanosis.  Neurologic: non focal  Psychiatric: Normal judgment and insight. Alert and oriented x 3. Normal mood.     Data Reviewed: I have personally reviewed following labs and imaging studies  CBC:  Recent Labs Lab 10/26/15 0845 10/29/15 1715 10/30/15 0148  WBC 5.7 6.8 7.2  NEUTROABS 4.5  --   --   HGB 9.6* 9.1* 8.7*  HCT 27.4* 26.0* 25.6*  MCV 94.2 94.9 94.8  PLT 217 291 0000000   Basic Metabolic Panel:  Recent Labs Lab 10/26/15 0845 10/29/15 1105 10/29/15 1715 10/30/15 0148 10/30/15 0742  NA 125* 118* 121* 122* 125*  K 4.7 5.1 5.3* 4.8 4.6  CL 92* 88* 90* 91* 94*  CO2 21* 26 21* 21* 22  GLUCOSE 88 106* 89 106* 93  BUN 17 27* 27* 29* 25*  CREATININE 1.36* 1.78* 1.69* 1.71* 1.45*  CALCIUM 8.8* 8.4 8.3* 8.3* 8.1*  PHOS  --  4.1  --   --   --    GFR: Estimated Creatinine Clearance: 30.2 mL/min (by C-G formula based on Cr of 1.45). Liver Function Tests:  Recent Labs Lab 10/26/15 0845 10/29/15 1105 10/29/15 1715  AST 21  --  19  ALT 20  --  16  ALKPHOS 53  --  54  BILITOT 0.9  --  0.3  PROT 6.5  --  5.8*  ALBUMIN 3.4* 3.5 3.3*   No results for input(s): LIPASE, AMYLASE in the last 168 hours. No results for input(s): AMMONIA in the last 168 hours. Coagulation Profile: No results for input(s): INR, PROTIME in the last 168 hours. Cardiac Enzymes: No results for input(s): CKTOTAL, CKMB, CKMBINDEX, TROPONINI in the last 168 hours. BNP (last 3 results) No results for input(s): PROBNP in the last 8760 hours. HbA1C: No results for input(s): HGBA1C in the last 72 hours. CBG: No results for input(s): GLUCAP in the last 168 hours. Lipid Profile: No results for input(s): CHOL, HDL, LDLCALC, TRIG, CHOLHDL, LDLDIRECT in the last 72 hours. Thyroid Function Tests:  Recent Labs  10/30/15 0148  TSH 0.673   Anemia Panel: No results for input(s): VITAMINB12, FOLATE, FERRITIN, TIBC, IRON,  RETICCTPCT in the last 72 hours. Urine analysis:    Component Value Date/Time   COLORURINE YELLOW 10/29/2015 1849   APPEARANCEUR CLEAR 10/29/2015 1849   LABSPEC 1.012 10/29/2015 1849   PHURINE 6.0 10/29/2015 1849   GLUCOSEU NEGATIVE 10/29/2015 1849   HGBUR NEGATIVE 10/29/2015 1849   BILIRUBINUR NEGATIVE 10/29/2015 1849   BILIRUBINUR neg 08/05/2011 1119   KETONESUR NEGATIVE 10/29/2015 1849   KETONESUR negative 10/23/2011 1633   PROTEINUR NEGATIVE 10/29/2015 1849   PROTEINUR neg 10/23/2011 1633   UROBILINOGEN 1.0 01/18/2015 1312   UROBILINOGEN negative 10/23/2011 1633   NITRITE NEGATIVE 10/29/2015 1849   NITRITE neg 10/23/2011 1633   LEUKOCYTESUR SMALL* 10/29/2015 1849   Sepsis Labs: Invalid input(s): PROCALCITONIN, LACTICIDVEN  No results found  for this or any previous visit (from the past 240 hour(s)).    Radiology Studies: Dg Chest 2 View  10/29/2015  CLINICAL DATA:  Shortness of breath for 4 days. EXAM: CHEST  2 VIEW COMPARISON:  Radiographs most recently 10/26/2015, CT 09/25/2015 FINDINGS: Cardiomegaly and tortuous thoracic aorta unchanged. Small bilateral pleural effusions are unchanged. Linear atelectasis or scarring at the right lung base again seen. No pulmonary edema or consolidation. Compression deformities in the and lower midthoracic spine and exaggerated thoracic kyphosis are unchanged. IMPRESSION: 1. No acute abnormality. 2. Stable cardiomegaly and small pleural effusions. Unchanged atelectasis or scarring in the right lower lobe. Electronically Signed   By: Jeb Levering M.D.   On: 10/29/2015 20:53   Scheduled Meds: . aspirin EC  81 mg Oral QPC lunch  . calcium-vitamin D  1 tablet Oral QPC lunch  . carvedilol  12.5 mg Oral BID WC  . enoxaparin (LOVENOX) injection  40 mg Subcutaneous Daily  . escitalopram  10 mg Oral Daily  . famotidine  20 mg Oral BID  . furosemide  20 mg Intravenous Q12H  . gabapentin  100 mg Oral BID  . loratadine  10 mg Oral QPM  .  montelukast  10 mg Oral QHS  . multivitamin with minerals  1 tablet Oral QPC lunch  . [START ON 10/31/2015] pravastatin  80 mg Oral QHS  . sodium chloride flush  3 mL Intravenous Q12H  . vitamin B-12  1,000 mcg Oral Once per day on Mon Wed Fri   Continuous Infusions:     Marzetta Board, MD, PhD [facility] Pager [phone]  If 7PM-7AM, please contact night-coverage [email or web address] Password [facility] 10/30/2015, 12:06 PM

## 2015-10-30 NOTE — Consult Note (Signed)
   [facility] CM Inpatient Consult   10/30/2015 Haley Harris [birth year] [id number]   Patient is currently active with [facility] for chronic disease management services.  Patient has been engaged by a [employer].  Met with the patient who states she took a fall over the weekend and her sodium levels were subnormal, she continued to worsen and saw her primary care provider and got a call that her sodium was at critical values.  She states she and the doctors realize that her HF and her sodium and fluid requirements are on a 'see-saw' at times.  She expresses ongoing follow up with [facility].  She also wanted to continue her therapy at home with [place].    Our community based plan of care has focused on disease management and community resource support.  Patient will receive a post discharge transition of care call and will be evaluated for monthly home visits for assessments and disease process education.  Made Inpatient Case Manager aware that [facility] following. Of note, [facility] Care Management services does not replace or interfere with any services that are needed or arranged by inpatient case management or social work.  For additional questions or referrals please contact:  Natividad Brood, RN BSN [facility] Liaison  [phone] business mobile phone Toll free office [phone]

## 2015-10-30 NOTE — Evaluation (Signed)
Physical Therapy Evaluation Patient Details Name: Haley Harris MRN: [id number] DOB: 03-20-1929 Today's Date: 10/30/2015   History of Present Illness  Pt adm with hyponatremia and weakness.PMH - CHF, COPD, PNA  Clinical Impression  Pt admitted with above diagnosis and presents to PT with functional limitations due to deficits listed below (See PT problem list). Pt needs skilled PT to maximize independence and safety to allow discharge to home with HHPT.      Follow Up Recommendations Home health PT;Supervision - Intermittent    Equipment Recommendations  None recommended by PT    Recommendations for Other Services       Precautions / Restrictions Precautions Precautions: Fall Restrictions Weight Bearing Restrictions: No      Mobility  Bed Mobility Overal bed mobility: Modified Independent             General bed mobility comments: increased time to perform, no physical assist required.   Transfers Overall transfer level: Needs assistance Equipment used: 4-wheeled walker Transfers: Sit to/from Stand Sit to Stand: Supervision         General transfer comment: supervision for safety  Ambulation/Gait Ambulation/Gait assistance: Supervision Ambulation Distance (Feet): 70 Feet (x 2) Assistive device: 4-wheeled walker Gait Pattern/deviations: Step-through pattern;Decreased stride length;Trunk flexed Gait velocity: decr Gait velocity interpretation: <1.8 ft/sec, indicative of risk for recurrent falls General Gait Details: Pt with dyspnea 2/4 with amb which pt reports is improved since yesterday. Supervision for safety but no loss of balance  Stairs            Wheelchair Mobility    Modified Rankin (Stroke Patients Only)       Balance Overall balance assessment: Needs assistance Sitting-balance support: No upper extremity supported;Feet supported Sitting balance-Leahy Scale: Good     Standing balance support: No upper extremity supported;During  functional activity Standing balance-Leahy Scale: Fair                               Pertinent Vitals/Pain Pain Assessment: No/denies pain    Home Living Family/patient expects to be discharged to:: Private residence Living Arrangements: Children Available Help at Discharge: Family;Available 24 hours/day Type of Home: House Home Access: Stairs to enter Entrance Stairs-Rails: Can reach both Entrance Stairs-Number of Steps: 3 Home Layout: Able to live on main level with bedroom/bathroom Home Equipment: Cane - single point;Transport chair;Walker - 4 wheels;Walker - 2 wheels;Shower seat;Toilet riser      Prior Function Level of Independence: Independent with assistive device(s) (Primarily uses rollator)               Hand Dominance   Dominant Hand: Right    Extremity/Trunk Assessment   Upper Extremity Assessment: Defer to OT evaluation           Lower Extremity Assessment: Generalized weakness         Communication   Communication: HOH  Cognition Arousal/Alertness: Awake/alert Behavior During Therapy: WFL for tasks assessed/performed Overall Cognitive Status: Within Functional Limits for tasks assessed                      General Comments      Exercises        Assessment/Plan    PT Assessment Patient needs continued PT services  PT Diagnosis Difficulty walking;Generalized weakness   PT Problem List Decreased strength;Decreased activity tolerance;Decreased balance;Decreased mobility  PT Treatment Interventions DME instruction;Gait training;Functional mobility training;Therapeutic exercise;Therapeutic activities;Balance training;Patient/family education  PT Goals (Current goals can be found in the Care Plan section) Acute Rehab PT Goals Patient Stated Goal: to go home PT Goal Formulation: With patient Time For Goal Achievement: 11/06/15 Potential to Achieve Goals: Good    Frequency Min 3X/week   Barriers to discharge         Co-evaluation               End of Session Equipment Utilized During Treatment: Gait belt Activity Tolerance: Patient tolerated treatment well Patient left: in chair;with call bell/phone within reach;with chair alarm set Nurse Communication: Mobility status    Functional Assessment Tool Used: clinical judgement Functional Limitation: Mobility: Walking and moving around Mobility: Walking and Moving Around Current Status [phone]): At least 1 percent but less than 20 percent impaired, limited or restricted Mobility: Walking and Moving Around Goal Status [phone]): At least 1 percent but less than 20 percent impaired, limited or restricted    Time: 0853-0920 PT Time Calculation (min) (ACUTE ONLY): 27 min   Charges:   PT Evaluation $PT Eval Moderate Complexity: 1 Procedure PT Treatments $Gait Training: 8-22 mins   PT G Codes:   PT G-Codes **NOT FOR INPATIENT CLASS** Functional Assessment Tool Used: clinical judgement Functional Limitation: Mobility: Walking and moving around Mobility: Walking and Moving Around Current Status [id number]): At least 1 percent but less than 20 percent impaired, limited or restricted Mobility: Walking and Moving Around Goal Status [phone]): At least 1 percent but less than 20 percent impaired, limited or restricted    [facility] 10/30/2015, 9:43 AM [place]

## 2015-10-30 NOTE — Telephone Encounter (Signed)
Was admitted with sodium of 121 Will check on her in hospital

## 2015-10-30 NOTE — Care Management Note (Signed)
Case Management Note  Patient Details  Name: Haley Harris MRN: [id number] Date of Birth: [birth year]  Subjective/Objective:                 Spoke with patient at the bedside. She states she lives with her daughter. She is active with [facility] PT from DC 3 weeks ago when she had the flu. She states that she has two rolators at home and denies any further DME needs. Patient is in observation for hyponatremia and fall.   Action/Plan:  Will DC to home and resume HH when medically stable.  Expected Discharge Date:                  Expected Discharge Plan:  [place]  In-House Referral:     Discharge planning Services  CM Consult  Post Acute Care Choice:  Resumption of Svcs/PTA Provider Choice offered to:  Patient  DME Arranged:    DME Agency:     HH Arranged:  PT HH Agency:  [place]  Status of Service:  In process, will continue to follow  Medicare Important Message Given:    Date Medicare IM Given:    Medicare IM give by:    Date Additional Medicare IM Given:    Additional Medicare Important Message give by:     If discussed at [place] of Stay Meetings, dates discussed:    Additional Comments:  Carles Collet, RN 10/30/2015, 2:30 PM

## 2015-10-30 NOTE — Evaluation (Signed)
Occupational Therapy Evaluation and Discharge Patient Details Name: Haley Harris MRN: [id number] DOB: 1929/04/28 Today's Date: 10/30/2015    History of Present Illness Pt adm with hyponatremia and weakness.PMH - CHF, COPD, PNA   Clinical Impression   Pt was performing ADL and mobility at a modified independent level. She was inconsistently able to perform laundry and cook, depending on her energy level. Pt, overall, is performing at a supervision level due to generalized weakness. She has all necessary equipment to be able to care for herself at home and 24 hour care of her family. She is well versed in energy conservation strategies. No further OT needs.    Follow Up Recommendations  No OT follow up (resume HHPT)    Equipment Recommendations  None recommended by OT    Recommendations for Other Services       Precautions / Restrictions Precautions Precautions: Fall Restrictions Weight Bearing Restrictions: No      Mobility Bed Mobility Overal bed mobility: Modified Independent                Transfers Overall transfer level: Needs assistance Equipment used: Rolling walker (2 wheeled) Transfers: Sit to/from Stand Sit to Stand: Supervision         General transfer comment: supervision for safety    Balance     Sitting balance-Leahy Scale: Good       Standing balance-Leahy Scale: Fair                              ADL Overall ADL's : Needs assistance/impaired Eating/Feeding: Independent;Sitting   Grooming: Wash/dry hands;Standing;Supervision/safety   Upper Body Bathing: Set up;Sitting   Lower Body Bathing: Supervison/ safety;With adaptive equipment;Sit to/from stand   Upper Body Dressing : Set up;Sitting   Lower Body Dressing: Supervision/safety;With adaptive equipment;Sit to/from stand   Toilet Transfer: Supervision/safety;Ambulation;RW;BSC   Toileting- Water quality scientist and Hygiene: Supervision/safety;Sit to/from stand        Functional mobility during ADLs: Supervision/safety;Rolling walker General ADL Comments: Pt is well educated in energy conservation strategies from previous rehabilitation she has received.     Vision     Perception     Praxis      Pertinent Vitals/Pain Pain Assessment: No/denies pain     Hand Dominance Right   Extremity/Trunk Assessment Upper Extremity Assessment Upper Extremity Assessment: Overall WFL for tasks assessed   Lower Extremity Assessment Lower Extremity Assessment: Defer to PT evaluation       Communication Communication Communication: HOH   Cognition Arousal/Alertness: Awake/alert Behavior During Therapy: WFL for tasks assessed/performed Overall Cognitive Status: Within Functional Limits for tasks assessed                     General Comments       Exercises       Shoulder Instructions      Home Living Family/patient expects to be discharged to:: Private residence Living Arrangements: Children Available Help at Discharge: Family;Available 24 hours/day Type of Home: House Home Access: Stairs to enter [employer] of Steps: 3 Entrance Stairs-Rails: Can reach both Home Layout: Able to live on main level with bedroom/bathroom     Bathroom Shower/Tub: Occupational psychologist: Handicapped height     Home Equipment: [place] - single Tax inspector - 4 wheels;Walker - 2 wheels;Shower seat;Toilet riser;Adaptive equipment;Hand held shower head (lift chair) Adaptive Equipment: Reacher;Sock aid;Long-handled sponge        Prior Functioning/Environment  Level of Independence: Independent with assistive device(s)        Comments: uses rollator in home, transport chair out of home, cooks and does her own laundry when she feels well    OT Diagnosis: Generalized weakness   OT Problem List:     OT Treatment/Interventions:      OT Goals(Current goals can be found in the care plan section) Acute Rehab OT  Goals Patient Stated Goal: to go home  OT Frequency:     Barriers to D/C:            Co-evaluation              End of Session Equipment Utilized During Treatment: Rolling walker;Gait belt  Activity Tolerance: Patient tolerated treatment well Patient left: in bed;with call bell/phone within reach;with family/visitor present;with nursing/sitter in room   Time: [id number] OT Time Calculation (min): 17 min Charges:  OT General Charges $OT Visit: 1 Procedure OT Evaluation $OT Eval Low Complexity: 1 Procedure G-Codes: OT G-codes **NOT FOR INPATIENT CLASS** Functional Assessment Tool Used: clinical judgement Functional Limitation: Self care Self Care Current Status [id number]): At least 1 percent but less than 20 percent impaired, limited or restricted Self Care Goal Status [id number]): At least 1 percent but less than 20 percent impaired, limited or restricted Self Care Discharge Status [phone]): At least 1 percent but less than 20 percent impaired, limited or restricted  [name] 10/30/2015, 3:53 PM  [phone]

## 2015-10-31 DIAGNOSIS — I5042 Chronic combined systolic (congestive) and diastolic (congestive) heart failure: Secondary | ICD-10-CM

## 2015-10-31 DIAGNOSIS — N179 Acute kidney failure, unspecified: Secondary | ICD-10-CM

## 2015-10-31 DIAGNOSIS — J439 Emphysema, unspecified: Secondary | ICD-10-CM

## 2015-10-31 DIAGNOSIS — N183 Chronic kidney disease, stage 3 (moderate): Secondary | ICD-10-CM

## 2015-10-31 DIAGNOSIS — I10 Essential (primary) hypertension: Secondary | ICD-10-CM

## 2015-10-31 LAB — BASIC METABOLIC PANEL
ANION GAP: 10 (ref 5–15)
BUN: 23 mg/dL — AB (ref 6–20)
CHLORIDE: 92 mmol/L — AB (ref 101–111)
CO2: 25 mmol/L (ref 22–32)
Calcium: 8.4 mg/dL — ABNORMAL LOW (ref 8.9–10.3)
Creatinine, Ser: 1.28 mg/dL — ABNORMAL HIGH (ref 0.44–1.00)
GFR calc Af Amer: 43 mL/min — ABNORMAL LOW (ref >60)
GFR calc non Af Amer: 37 mL/min — ABNORMAL LOW (ref >60)
Glucose, Bld: 95 mg/dL (ref 65–99)
POTASSIUM: 4 mmol/L (ref 3.5–5.1)
SODIUM: 127 mmol/L — AB (ref 135–145)

## 2015-10-31 LAB — UREA NITROGEN, URINE: Urea Nitrogen, Ur: 356 mg/dL

## 2015-10-31 MED ORDER — FUROSEMIDE 40 MG PO TABS
40.0000 mg | ORAL_TABLET | Freq: Every day | ORAL | Status: DC
  Administered 2015-10-31: 40 mg via ORAL
  Filled 2015-10-31: qty 1

## 2015-10-31 NOTE — Progress Notes (Signed)
PROGRESS NOTE  Haley Harris [id number] DOB: August 17, 1928 DOA: 10/29/2015 PCP: [name], MD Outpatient Specialists:      Brief Narrative: 82 y.o. female with medical history significant of chronic hyponatremia (baseline of sodium is 125-130 recently), hypertension, hyperlipidemia, GERD, depression, anxiety, hearing loss, PVD, breast cancer (S/P mastectomy), chronic combined systolic and diastolic congestive heart failure, chronic kidney disease-stage III, who was admitted on 4/18 with hyponatremia  Assessment & Plan: Principal Problem:   Hyponatremia Active Problems:   Dyslipidemia   COPD (chronic obstructive pulmonary disease) (HCC)   GERD   Osteoarthritis   Osteoporosis   Chronic combined systolic and diastolic CHF, NYHA class 1 (HCC)   Acute renal failure superimposed on stage 3 chronic kidney disease (HCC)   PVD (peripheral vascular disease) ([place])   Acute hyperkalemia   Fall with injury   Generalized weakness   Essential hypertension   Hyponatremia - Acute on chronic, sodium 1 admission was 121, patient looked fluid overloaded on exam with lower extremity edema, JVD and chest x-ray with cardiomegaly and small pleural effusions so likely in the setting of fluid overload - Discontinued IV fluids 4/19 - Continue to hold ARB and spironolactone - s/p IV Lasix 4/19, resume po Lasix today - Na improved to 127, continue to monitor - Lexapro discontinued - fluid restriction  - if stable sodium she can go home tomorrow  Hyperkalemia  - Improved, monitor while on Lasix  AoCKD-III  - Creatinine stable, improved with diuresis  Acute on chronic combined systolic and diastolic CHF - 2-D echo on 01/19/15 showed EF of 40-45% with grade 1 diastolic dysfunction.  - Continue aspirin and Coreg  HLD  - last LDL was 55 on 04/03/14 - Continue home medications: pravastatin  GERD - Pepcid  HTN - Coreg - hold losartan and spironolactone as above - IV hydralazine prn  COPD  (chronic obstructive pulmonary disease) (HCC)  - stable - prn albuterol nebs, Singulair,  Depression  - hold Lexapro as it can cause hyponatremia  Falls at home / excessive sleepiness - Patient has been having excessive daytime sleepiness at home, the daughter states that she often falls asleep at random times. She'll fall last week while being on the commode, regarding an ED visit and stitches. ? Degree of obstructive sleep apnea versus chronic hypercarbic respiratory failure due to her COPD, may need additional studies as outpatient   DVT prophylaxis: Lovenox Code Status: Partial code Family Communication: no family at bedside Disposition Plan: home when ready, 1 day if Na stable Barriers for discharge: hyponatremia, diuresis  Consultants:   None   Procedures:   None   Antimicrobials:  None    Subjective: - feeling good, very talkative. Denies chest pain / shortness of breath. Worked with PT yesterday and able to ambulate well however felt very tired afterwards  Objective: Filed Vitals:   10/30/15 0647 10/30/15 1311 10/30/15 2147 10/31/15 0534  BP: 154/58 146/59 104/41 160/72  Pulse: 72 71 70 80  Temp: 97.9 F (36.6 C) 98 F (36.7 C) 99.2 F (37.3 C) 98 F (36.7 C)  TempSrc:  Oral Oral Oral  Resp: 21 19 18 16   Height: 5\' 5"  (1.651 m)     Weight: 79.9 kg (176 lb 2.4 oz)   80.5 kg (177 lb 7.5 oz)  SpO2: 99% 100% 98% 92%    Intake/Output Summary (Last 24 hours) at 10/31/15 1033 Last data filed at 10/31/15 0011  Gross per 24 hour  Intake    123 ml  Output   1600 ml  Net  -1477 ml   Filed Weights   10/30/15 0647 10/31/15 0534  Weight: 79.9 kg (176 lb 2.4 oz) 80.5 kg (177 lb 7.5 oz)    Examination: Constitutional: NAD Filed Vitals:   10/30/15 0647 10/30/15 1311 10/30/15 2147 10/31/15 0534  BP: 154/58 146/59 104/41 160/72  Pulse: 72 71 70 80  Temp: 97.9 F (36.6 C) 98 F (36.7 C) 99.2 F (37.3 C) 98 F (36.7 C)  TempSrc:  Oral Oral Oral  Resp: 21 19  18 16   Height: 5\' 5"  (1.651 m)     Weight: 79.9 kg (176 lb 2.4 oz)   80.5 kg (177 lb 7.5 oz)  SpO2: 99% 100% 98% 92%   Eyes: PERRL, lids and conjunctivae normal ENMT: Mucous membranes are moist Respiratory: clear to auscultation bilaterally, no wheezing, no crackles.   Cardiovascular: Regular rate and rhythm, no murmurs / rubs / gallops. trace LE edema.  Abdomen: no tenderness Musculoskeletal: no clubbing / cyanosis.  Neurologic: non focal  Psychiatric: Normal judgment and insight. Alert and oriented x 3. Normal mood.     Data Reviewed: I have personally reviewed following labs and imaging studies  CBC:  Recent Labs Lab 10/26/15 0845 10/29/15 1715 10/30/15 0148  WBC 5.7 6.8 7.2  NEUTROABS 4.5  --   --   HGB 9.6* 9.1* 8.7*  HCT 27.4* 26.0* 25.6*  MCV 94.2 94.9 94.8  PLT 217 291 0000000   Basic Metabolic Panel:  Recent Labs Lab 10/29/15 1105  10/30/15 0148 10/30/15 0742 10/30/15 1328 10/30/15 2154 10/31/15 0522  NA 118*  < > 122* 125* 126* 125* 127*  K 5.1  < > 4.8 4.6 4.2 4.0 4.0  CL 88*  < > 91* 94* 93* 91* 92*  CO2 26  < > 21* 22 22 25 25   GLUCOSE 106*  < > 106* 93 142* 116* 95  BUN 27*  < > 29* 25* 22* 24* 23*  CREATININE 1.78*  < > 1.71* 1.45* 1.40* 1.56* 1.28*  CALCIUM 8.4  < > 8.3* 8.1* 8.4* 8.3* 8.4*  PHOS 4.1  --   --   --   --   --   --   < > = values in this interval not displayed. GFR: Estimated Creatinine Clearance: 34.3 mL/min (by C-G formula based on Cr of 1.28). Liver Function Tests:  Recent Labs Lab 10/26/15 0845 10/29/15 1105 10/29/15 1715  AST 21  --  19  ALT 20  --  16  ALKPHOS 53  --  54  BILITOT 0.9  --  0.3  PROT 6.5  --  5.8*  ALBUMIN 3.4* 3.5 3.3*   No results for input(s): LIPASE, AMYLASE in the last 168 hours. No results for input(s): AMMONIA in the last 168 hours. Coagulation Profile: No results for input(s): INR, PROTIME in the last 168 hours. Cardiac Enzymes: No results for input(s): CKTOTAL, CKMB, CKMBINDEX, TROPONINI in  the last 168 hours. BNP (last 3 results) No results for input(s): PROBNP in the last 8760 hours. HbA1C: No results for input(s): HGBA1C in the last 72 hours. CBG: No results for input(s): GLUCAP in the last 168 hours. Lipid Profile: No results for input(s): CHOL, HDL, LDLCALC, TRIG, CHOLHDL, LDLDIRECT in the last 72 hours. Thyroid Function Tests:  Recent Labs  10/30/15 0148  TSH 0.673   Anemia Panel: No results for input(s): VITAMINB12, FOLATE, FERRITIN, TIBC, IRON, RETICCTPCT in the last 72 hours. Urine analysis:  Component Value Date/Time   COLORURINE YELLOW 10/29/2015 1849   APPEARANCEUR CLEAR 10/29/2015 1849   LABSPEC 1.012 10/29/2015 1849   PHURINE 6.0 10/29/2015 1849   GLUCOSEU NEGATIVE 10/29/2015 1849   HGBUR NEGATIVE 10/29/2015 1849   BILIRUBINUR NEGATIVE 10/29/2015 1849   BILIRUBINUR neg 08/05/2011 1119   KETONESUR NEGATIVE 10/29/2015 1849   KETONESUR negative 10/23/2011 1633   PROTEINUR NEGATIVE 10/29/2015 1849   PROTEINUR neg 10/23/2011 1633   UROBILINOGEN 1.0 01/18/2015 1312   UROBILINOGEN negative 10/23/2011 1633   NITRITE NEGATIVE 10/29/2015 1849   NITRITE neg 10/23/2011 1633   LEUKOCYTESUR SMALL* 10/29/2015 1849   Sepsis Labs: Invalid input(s): PROCALCITONIN, LACTICIDVEN  No results found for this or any previous visit (from the past 240 hour(s)).    Radiology Studies: Dg Chest 2 View  10/29/2015  CLINICAL DATA:  Shortness of breath for 4 days. EXAM: CHEST  2 VIEW COMPARISON:  Radiographs most recently 10/26/2015, CT 09/25/2015 FINDINGS: Cardiomegaly and tortuous thoracic aorta unchanged. Small bilateral pleural effusions are unchanged. Linear atelectasis or scarring at the right lung base again seen. No pulmonary edema or consolidation. Compression deformities in the and lower midthoracic spine and exaggerated thoracic kyphosis are unchanged. IMPRESSION: 1. No acute abnormality. 2. Stable cardiomegaly and small pleural effusions. Unchanged atelectasis  or scarring in the right lower lobe. Electronically Signed   By: Jeb Levering M.D.   On: 10/29/2015 20:53   Scheduled Meds: . aspirin EC  81 mg Oral QPC lunch  . calcium-vitamin D  1 tablet Oral QPC lunch  . carvedilol  12.5 mg Oral BID WC  . enoxaparin (LOVENOX) injection  40 mg Subcutaneous Daily  . famotidine  20 mg Oral BID  . furosemide  40 mg Oral Daily  . gabapentin  100 mg Oral BID  . loratadine  10 mg Oral QPM  . montelukast  10 mg Oral QHS  . multivitamin with minerals  1 tablet Oral QPC lunch  . pravastatin  80 mg Oral QHS  . sodium chloride flush  3 mL Intravenous Q12H  . vitamin B-12  1,000 mcg Oral Once per day on Mon Wed Fri   Continuous Infusions:   Time spent: 25 minutes, more than 50% bedside  Marzetta Board, MD, PhD [facility] Pager [phone]  If 7PM-7AM, please contact night-coverage [email or web address] Password [facility] 10/31/2015, 10:33 AM

## 2015-11-01 DIAGNOSIS — I252 Old myocardial infarction: Secondary | ICD-10-CM | POA: Diagnosis not present

## 2015-11-01 DIAGNOSIS — G709 Myoneural disorder, unspecified: Secondary | ICD-10-CM | POA: Diagnosis present

## 2015-11-01 DIAGNOSIS — F329 Major depressive disorder, single episode, unspecified: Secondary | ICD-10-CM | POA: Diagnosis present

## 2015-11-01 DIAGNOSIS — Z880 Allergy status to penicillin: Secondary | ICD-10-CM | POA: Diagnosis not present

## 2015-11-01 DIAGNOSIS — E875 Hyperkalemia: Secondary | ICD-10-CM | POA: Diagnosis present

## 2015-11-01 DIAGNOSIS — I130 Hypertensive heart and chronic kidney disease with heart failure and stage 1 through stage 4 chronic kidney disease, or unspecified chronic kidney disease: Secondary | ICD-10-CM | POA: Diagnosis present

## 2015-11-01 DIAGNOSIS — Z87891 Personal history of nicotine dependence: Secondary | ICD-10-CM | POA: Diagnosis not present

## 2015-11-01 DIAGNOSIS — M1990 Unspecified osteoarthritis, unspecified site: Secondary | ICD-10-CM | POA: Diagnosis present

## 2015-11-01 DIAGNOSIS — G479 Sleep disorder, unspecified: Secondary | ICD-10-CM | POA: Diagnosis present

## 2015-11-01 DIAGNOSIS — Z9181 History of falling: Secondary | ICD-10-CM | POA: Diagnosis not present

## 2015-11-01 DIAGNOSIS — M810 Age-related osteoporosis without current pathological fracture: Secondary | ICD-10-CM | POA: Diagnosis present

## 2015-11-01 DIAGNOSIS — E860 Dehydration: Secondary | ICD-10-CM | POA: Diagnosis present

## 2015-11-01 DIAGNOSIS — J439 Emphysema, unspecified: Secondary | ICD-10-CM | POA: Diagnosis not present

## 2015-11-01 DIAGNOSIS — I739 Peripheral vascular disease, unspecified: Secondary | ICD-10-CM | POA: Diagnosis present

## 2015-11-01 DIAGNOSIS — Z853 Personal history of malignant neoplasm of breast: Secondary | ICD-10-CM | POA: Diagnosis not present

## 2015-11-01 DIAGNOSIS — J309 Allergic rhinitis, unspecified: Secondary | ICD-10-CM | POA: Diagnosis present

## 2015-11-01 DIAGNOSIS — Z7982 Long term (current) use of aspirin: Secondary | ICD-10-CM | POA: Diagnosis not present

## 2015-11-01 DIAGNOSIS — Z888 Allergy status to other drugs, medicaments and biological substances status: Secondary | ICD-10-CM | POA: Diagnosis not present

## 2015-11-01 DIAGNOSIS — H9190 Unspecified hearing loss, unspecified ear: Secondary | ICD-10-CM | POA: Diagnosis present

## 2015-11-01 DIAGNOSIS — E871 Hypo-osmolality and hyponatremia: Secondary | ICD-10-CM | POA: Diagnosis not present

## 2015-11-01 DIAGNOSIS — Z886 Allergy status to analgesic agent status: Secondary | ICD-10-CM | POA: Diagnosis not present

## 2015-11-01 DIAGNOSIS — I5042 Chronic combined systolic (congestive) and diastolic (congestive) heart failure: Secondary | ICD-10-CM | POA: Diagnosis not present

## 2015-11-01 DIAGNOSIS — K219 Gastro-esophageal reflux disease without esophagitis: Secondary | ICD-10-CM | POA: Diagnosis present

## 2015-11-01 DIAGNOSIS — E785 Hyperlipidemia, unspecified: Secondary | ICD-10-CM | POA: Diagnosis present

## 2015-11-01 DIAGNOSIS — J449 Chronic obstructive pulmonary disease, unspecified: Secondary | ICD-10-CM | POA: Diagnosis present

## 2015-11-01 DIAGNOSIS — N179 Acute kidney failure, unspecified: Secondary | ICD-10-CM | POA: Diagnosis not present

## 2015-11-01 DIAGNOSIS — N183 Chronic kidney disease, stage 3 (moderate): Secondary | ICD-10-CM | POA: Diagnosis present

## 2015-11-01 LAB — URINE CULTURE

## 2015-11-01 LAB — BASIC METABOLIC PANEL
ANION GAP: 9 (ref 5–15)
Anion gap: 9 (ref 5–15)
BUN: 17 mg/dL (ref 6–20)
BUN: 16 mg/dL (ref 6–20)
CALCIUM: 8.7 mg/dL — AB (ref 8.9–10.3)
CHLORIDE: 88 mmol/L — AB (ref 101–111)
CO2: 28 mmol/L (ref 22–32)
CO2: 28 mmol/L (ref 22–32)
CREATININE: 1.16 mg/dL — AB (ref 0.44–1.00)
Calcium: 8.5 mg/dL — ABNORMAL LOW (ref 8.9–10.3)
Chloride: 88 mmol/L — ABNORMAL LOW (ref 101–111)
Creatinine, Ser: 1.16 mg/dL — ABNORMAL HIGH (ref 0.44–1.00)
GFR calc Af Amer: 49 mL/min — ABNORMAL LOW (ref >60)
GFR, EST AFRICAN AMERICAN: 49 mL/min — AB (ref >60)
GFR, EST NON AFRICAN AMERICAN: 42 mL/min — AB (ref >60)
GFR, EST NON AFRICAN AMERICAN: 42 mL/min — AB (ref >60)
Glucose, Bld: 103 mg/dL — ABNORMAL HIGH (ref 65–99)
Glucose, Bld: 90 mg/dL (ref 65–99)
POTASSIUM: 4.6 mmol/L (ref 3.5–5.1)
Potassium: 4.8 mmol/L (ref 3.5–5.1)
SODIUM: 125 mmol/L — AB (ref 135–145)
SODIUM: 125 mmol/L — AB (ref 135–145)

## 2015-11-01 MED ORDER — TRAZODONE HCL 50 MG PO TABS
25.0000 mg | ORAL_TABLET | Freq: Every evening | ORAL | Status: DC | PRN
  Filled 2015-11-01: qty 1

## 2015-11-01 MED ORDER — FUROSEMIDE 10 MG/ML IJ SOLN
20.0000 mg | Freq: Two times a day (BID) | INTRAMUSCULAR | Status: DC
  Administered 2015-11-01 – 2015-11-02 (×3): 20 mg via INTRAVENOUS
  Filled 2015-11-01 (×3): qty 2

## 2015-11-01 NOTE — Progress Notes (Signed)
PROGRESS NOTE  Haley Harris [id number] DOB: 02-06-1929 DOA: 10/29/2015 PCP: [name], MD Outpatient Specialists:      Brief Narrative: 82 y.o. female with medical history significant of chronic hyponatremia (baseline of sodium is 125-130 recently), hypertension, hyperlipidemia, GERD, depression, anxiety, hearing loss, PVD, breast cancer (S/P mastectomy), chronic combined systolic and diastolic congestive heart failure, chronic kidney disease-stage III, who was admitted on 4/18 with hyponatremia  Assessment & Plan: Principal Problem:   Hyponatremia Active Problems:   Dyslipidemia   COPD (chronic obstructive pulmonary disease) (HCC)   GERD   Osteoarthritis   Osteoporosis   Chronic combined systolic and diastolic CHF, NYHA class 1 (HCC)   Acute renal failure superimposed on stage 3 chronic kidney disease (HCC)   PVD (peripheral vascular disease) ([place])   Acute hyperkalemia   Fall with injury   Generalized weakness   Essential hypertension   Hyponatremia - Acute on chronic, sodium 1 admission was 121, patient looked fluid overloaded on exam with lower extremity edema, JVD and chest x-ray with cardiomegaly and small pleural effusions so likely in the setting of fluid overload - Discontinued IV fluids 4/19 - Continue to hold ARB and spironolactone - s/p IV Lasix 4/19, resumed po Lasix 4/20 however needs more IV Lasix 4/21 - Na trending downwards again. Patient up all night drinking several jugs of water despite being on fluid restriction. LE swelling worse today. Weight up. IV Lasix - Lexapro discontinued - fluid restriction re-enforced with patient and nursing staff - not ready for dc  Hyperkalemia  - Improved, monitor while on Lasix  AoCKD-III  - Creatinine stable, improving  Acute on chronic combined systolic and diastolic CHF - 2-D echo on 01/19/15 showed EF of 40-45% with grade 1 diastolic dysfunction.  - Continue aspirin and Coreg  HLD  - last LDL was 55 on  04/03/14 - Continue home medications: pravastatin  GERD - Pepcid  HTN - Coreg - hold losartan and spironolactone as above - IV hydralazine prn  COPD (chronic obstructive pulmonary disease) (HCC)  - stable - prn albuterol nebs, Singulair,  Depression  - hold Lexapro as it can cause hyponatremia  Falls at home / excessive sleepiness - Patient has been having excessive daytime sleepiness at home, the daughter states that she often falls asleep at random times. She'll fall last week while being on the commode, regarding an ED visit and stitches. ? Degree of obstructive sleep apnea versus chronic hypercarbic respiratory failure due to her COPD, may need additional studies as outpatient   DVT prophylaxis: Lovenox Code Status: Partial code Family Communication: no family at bedside Disposition Plan: home when ready, 1 day if Na stable Barriers for discharge: hyponatremia, diuresis  Consultants:   None   Procedures:   None   Antimicrobials:  None    Subjective: - did not sleep well last night, was thirsty and drank a lot of water  Objective: Filed Vitals:   10/31/15 0534 10/31/15 1501 10/31/15 2134 11/01/15 0533  BP: 160/72 124/46 150/65 153/69  Pulse: 80 70 73 66  Temp: 98 F (36.7 C) 97.9 F (36.6 C) 98.2 F (36.8 C) 98.2 F (36.8 C)  TempSrc: Oral  Oral Oral  Resp: 16 20 18 18   Height:      Weight: 80.5 kg (177 lb 7.5 oz)   80.831 kg (178 lb 3.2 oz)  SpO2: 92% 98% 97% 96%    Intake/Output Summary (Last 24 hours) at 11/01/15 1008 Last data filed at 10/31/15 1515  Gross  per 24 hour  Intake    240 ml  Output      0 ml  Net    240 ml   Filed Weights   10/30/15 0647 10/31/15 0534 11/01/15 0533  Weight: 79.9 kg (176 lb 2.4 oz) 80.5 kg (177 lb 7.5 oz) 80.831 kg (178 lb 3.2 oz)    Examination: Constitutional: NAD Filed Vitals:   10/31/15 0534 10/31/15 1501 10/31/15 2134 11/01/15 0533  BP: 160/72 124/46 150/65 153/69  Pulse: 80 70 73 66  Temp: 98 F  (36.7 C) 97.9 F (36.6 C) 98.2 F (36.8 C) 98.2 F (36.8 C)  TempSrc: Oral  Oral Oral  Resp: 16 20 18 18   Height:      Weight: 80.5 kg (177 lb 7.5 oz)   80.831 kg (178 lb 3.2 oz)  SpO2: 92% 98% 97% 96%   Eyes: PERRL, lids and conjunctivae normal ENMT: Mucous membranes are moist Respiratory: clear to auscultation bilaterally, no wheezing, no crackles.   Cardiovascular: Regular rate and rhythm, no murmurs / rubs / gallops. 1+ LE edema.  Abdomen: no tenderness Musculoskeletal: no clubbing / cyanosis.      Data Reviewed: I have personally reviewed following labs and imaging studies  CBC:  Recent Labs Lab 10/26/15 0845 10/29/15 1715 10/30/15 0148  WBC 5.7 6.8 7.2  NEUTROABS 4.5  --   --   HGB 9.6* 9.1* 8.7*  HCT 27.4* 26.0* 25.6*  MCV 94.2 94.9 94.8  PLT 217 291 0000000   Basic Metabolic Panel:  Recent Labs Lab 10/29/15 1105  10/30/15 0742 10/30/15 1328 10/30/15 2154 10/31/15 0522 11/01/15 0618  NA 118*  < > 125* 126* 125* 127* 125*  K 5.1  < > 4.6 4.2 4.0 4.0 4.6  CL 88*  < > 94* 93* 91* 92* 88*  CO2 26  < > 22 22 25 25 28   GLUCOSE 106*  < > 93 142* 116* 95 103*  BUN 27*  < > 25* 22* 24* 23* 17  CREATININE 1.78*  < > 1.45* 1.40* 1.56* 1.28* 1.16*  CALCIUM 8.4  < > 8.1* 8.4* 8.3* 8.4* 8.5*  PHOS 4.1  --   --   --   --   --   --   < > = values in this interval not displayed. GFR: Estimated Creatinine Clearance: 37.9 mL/min (by C-G formula based on Cr of 1.16). Liver Function Tests:  Recent Labs Lab 10/26/15 0845 10/29/15 1105 10/29/15 1715  AST 21  --  19  ALT 20  --  16  ALKPHOS 53  --  54  BILITOT 0.9  --  0.3  PROT 6.5  --  5.8*  ALBUMIN 3.4* 3.5 3.3*   No results for input(s): LIPASE, AMYLASE in the last 168 hours. No results for input(s): AMMONIA in the last 168 hours. Coagulation Profile: No results for input(s): INR, PROTIME in the last 168 hours. Cardiac Enzymes: No results for input(s): CKTOTAL, CKMB, CKMBINDEX, TROPONINI in the last 168  hours. BNP (last 3 results) No results for input(s): PROBNP in the last 8760 hours. HbA1C: No results for input(s): HGBA1C in the last 72 hours. CBG: No results for input(s): GLUCAP in the last 168 hours. Lipid Profile: No results for input(s): CHOL, HDL, LDLCALC, TRIG, CHOLHDL, LDLDIRECT in the last 72 hours. Thyroid Function Tests:  Recent Labs  10/30/15 0148  TSH 0.673   Anemia Panel: No results for input(s): VITAMINB12, FOLATE, FERRITIN, TIBC, IRON, RETICCTPCT in the last 72 hours.  Urine analysis:    Component Value Date/Time   COLORURINE YELLOW 10/29/2015 1849   APPEARANCEUR CLEAR 10/29/2015 1849   LABSPEC 1.012 10/29/2015 1849   PHURINE 6.0 10/29/2015 1849   GLUCOSEU NEGATIVE 10/29/2015 1849   HGBUR NEGATIVE 10/29/2015 1849   BILIRUBINUR NEGATIVE 10/29/2015 1849   BILIRUBINUR neg 08/05/2011 1119   KETONESUR NEGATIVE 10/29/2015 1849   KETONESUR negative 10/23/2011 1633   PROTEINUR NEGATIVE 10/29/2015 1849   PROTEINUR neg 10/23/2011 1633   UROBILINOGEN 1.0 01/18/2015 1312   UROBILINOGEN negative 10/23/2011 1633   NITRITE NEGATIVE 10/29/2015 1849   NITRITE neg 10/23/2011 1633   LEUKOCYTESUR SMALL* 10/29/2015 1849   Sepsis Labs: Invalid input(s): PROCALCITONIN, LACTICIDVEN  Recent Results (from the past 240 hour(s))  Urine culture     Status: None   Collection Time: 10/29/15  6:49 PM  Result Value Ref Range Status   Specimen Description URINE, RANDOM  Final   Special Requests NONE  Final   Culture MULTIPLE SPECIES PRESENT, SUGGEST RECOLLECTION  Final   Report Status 11/01/2015 FINAL  Final      Radiology Studies: No results found. Scheduled Meds: . aspirin EC  81 mg Oral QPC lunch  . calcium-vitamin D  1 tablet Oral QPC lunch  . carvedilol  12.5 mg Oral BID WC  . enoxaparin (LOVENOX) injection  40 mg Subcutaneous Daily  . famotidine  20 mg Oral BID  . furosemide  20 mg Intravenous Q12H  . gabapentin  100 mg Oral BID  . loratadine  10 mg Oral QPM  .  montelukast  10 mg Oral QHS  . multivitamin with minerals  1 tablet Oral QPC lunch  . pravastatin  80 mg Oral QHS  . sodium chloride flush  3 mL Intravenous Q12H  . vitamin B-12  1,000 mcg Oral Once per day on Mon Wed Fri   Continuous Infusions:   Time spent: 25 minutes, more than 50% bedside  Marzetta Board, MD, PhD [facility] Pager [phone]  If 7PM-7AM, please contact night-coverage [email or web address] Password [email or web address] 11/01/2015, 10:08 AM

## 2015-11-01 NOTE — Progress Notes (Signed)
Physical Therapy Treatment Patient Details Name: Haley Harris MRN: [id number] DOB: 11/04/1928 Today's Date: Nov 13, 2015    History of Present Illness Pt adm with hyponatremia and weakness.PMH - CHF, COPD, PNA    PT Comments    Patient progressing with mobility and able to walk further per her report with rollator than she did earlier with regular rolling walker.  Feel she should be safe to return home once labs corrected with family support and resuming HHPT services.  Will follow during remainder of acute stay.  Follow Up Recommendations  Home health PT;Supervision - Intermittent     Equipment Recommendations  None recommended by PT    Recommendations for Other Services       Precautions / Restrictions Precautions Precautions: Fall    Mobility  Bed Mobility               General bed mobility comments: up in chair  Transfers Overall transfer level: Needs assistance Equipment used: None Transfers: Sit to/from Stand Sit to Stand: Supervision         General transfer comment: for safety due to weakness and reliant heavily on UE support  Ambulation/Gait Ambulation/Gait assistance: Supervision Ambulation Distance (Feet): 75 Feet (x 2) Assistive device: 4-wheeled walker Gait Pattern/deviations: Step-through pattern;Trunk flexed;Decreased stride length     General Gait Details: no noticeable dyspnea with ambulation today; stopped to rest seated on walker in hallway prior to returning to room; reported already walked once with nursing staff   Stairs            Wheelchair Mobility    Modified Rankin (Stroke Patients Only)       Balance             Standing balance-Leahy Scale: Fair Standing balance comment: stood up no AD and stayed standing while donning gown with assistance                    Cognition Arousal/Alertness: Awake/alert Behavior During Therapy: WFL for tasks assessed/performed Overall Cognitive Status: Within Functional  Limits for tasks assessed                      Exercises      General Comments        Pertinent Vitals/Pain Pain Assessment: 0-10 Pain Score: 3  Pain Location: neck and knees Pain Descriptors / Indicators: Aching Pain Intervention(s): Monitored during session;Repositioned;Limited activity within patient's tolerance    Home Living                      Prior Function            PT Goals (current goals can now be found in the care plan section) Progress towards PT goals: Progressing toward goals    Frequency  Min 3X/week    PT Plan Current plan remains appropriate    Co-evaluation             End of Session Equipment Utilized During Treatment: Gait belt Activity Tolerance: Patient tolerated treatment well Patient left: in chair;with call bell/phone within reach;with chair alarm set     Time: [id number] PT Time Calculation (min) (ACUTE ONLY): 17 min  Charges:  $Gait Training: 8-22 mins                    G Codes:      [name] 11/13/2015, 1:50 PM Magda Kiel, [place] Nov 13, 2015

## 2015-11-02 LAB — BASIC METABOLIC PANEL
ANION GAP: 9 (ref 5–15)
BUN: 18 mg/dL (ref 6–20)
CALCIUM: 8.9 mg/dL (ref 8.9–10.3)
CO2: 28 mmol/L (ref 22–32)
CREATININE: 1.21 mg/dL — AB (ref 0.44–1.00)
Chloride: 90 mmol/L — ABNORMAL LOW (ref 101–111)
GFR calc Af Amer: 46 mL/min — ABNORMAL LOW (ref >60)
GFR, EST NON AFRICAN AMERICAN: 40 mL/min — AB (ref >60)
GLUCOSE: 93 mg/dL (ref 65–99)
Potassium: 4.6 mmol/L (ref 3.5–5.1)
Sodium: 127 mmol/L — ABNORMAL LOW (ref 135–145)

## 2015-11-02 NOTE — Discharge Instructions (Signed)
Follow with Haley Simpler, MD in 5-7 days  Please get a complete blood count and chemistry panel checked by your Primary MD at your next visit, and again as instructed by your Primary MD. Please get your medications reviewed and adjusted by your Primary MD.  Please request your Primary MD to go over all Hospital Tests and Procedure/Radiological results at the follow up, please get all Hospital records sent to your Prim MD by signing hospital release before you go home.  If you had Pneumonia of Lung problems at the Hospital: Please get a 2 view Chest X ray done in 6-8 weeks after hospital discharge or sooner if instructed by your Primary MD.  If you have Congestive Heart Failure: Please call your Cardiologist or Primary MD anytime you have any of the following symptoms:  1) 3 pound weight gain in 24 hours or 5 pounds in 1 week  2) shortness of breath, with or without a dry hacking cough  3) swelling in the hands, feet or stomach  4) if you have to sleep on extra pillows at night in order to breathe  Follow cardiac low salt diet and 1.5 lit/day fluid restriction.  If you have diabetes Accuchecks 4 times/day, Once in AM empty stomach and then before each meal. Log in all results and show them to your primary doctor at your next visit. If any glucose reading is under 80 or above 300 call your primary MD immediately.  If you have Seizure/Convulsions/Epilepsy: Please do not drive, operate heavy machinery, participate in activities at heights or participate in high speed sports until you have seen by Primary MD or a Neurologist and advised to do so again.  If you had Gastrointestinal Bleeding: Please ask your Primary MD to check a complete blood count within one week of discharge or at your next visit. Your endoscopic/colonoscopic biopsies that are pending at the time of discharge, will also need to followed by your Primary MD.  Get Medicines reviewed and adjusted. Please take all your  medications with you for your next visit with your Primary MD  Please request your Primary MD to go over all hospital tests and procedure/radiological results at the follow up, please ask your Primary MD to get all Hospital records sent to his/her office.  If you experience worsening of your admission symptoms, develop shortness of breath, life threatening emergency, suicidal or homicidal thoughts you must seek medical attention immediately by calling 911 or calling your MD immediately  if symptoms less severe.  You must read complete instructions/literature along with all the possible adverse reactions/side effects for all the Medicines you take and that have been prescribed to you. Take any new Medicines after you have completely understood and accpet all the possible adverse reactions/side effects.   Do not drive or operate heavy machinery when taking Pain medications.   Do not take more than prescribed Pain, Sleep and Anxiety Medications  Special Instructions: If you have smoked or chewed Tobacco  in the last 2 yrs please stop smoking, stop any regular Alcohol  and or any Recreational drug use.  Wear Seat belts while driving.  Please note You were cared for by a hospitalist during your hospital stay. If you have any questions about your discharge medications or the care you received while you were in the hospital after you are discharged, you can call the unit and asked to speak with the hospitalist on call if the hospitalist that took care of you is not available. Once  you are discharged, your primary care physician will handle any further medical issues. Please note that NO REFILLS for any discharge medications will be authorized once you are discharged, as it is imperative that you return to your primary care physician (or establish a relationship with a primary care physician if you do not have one) for your aftercare needs so that they can reassess your need for medications and monitor your  lab values.  You can reach the hospitalist office at phone [phone] or fax [phone]   If you do not have a primary care physician, you can call [phone] for a physician referral.  Activity: As tolerated with Full fall precautions use walker/cane & assistance as needed  Diet: heart healthy, fluid restrict 1500 mL / day  Disposition Home

## 2015-11-02 NOTE — Progress Notes (Signed)
Reviewed discharge paperwork with pt.  PIV removed.  Pt denied any needs at this time.  Pt awaiting for daughter to come pick her up.  Will continue to monitor.

## 2015-11-02 NOTE — Discharge Summary (Signed)
Physician Discharge Summary  TYYANNA GASKINS [id number] DOB: 02-04-1929 DOA: 10/29/2015  PCP: [name], MD  Admit date: 10/29/2015 Discharge date: 11/02/2015  Time spent: > 30 minutes  Recommendations for Outpatient Follow-up:  1. Follow up with Dr. Silvio Pate in 1 week 2. Repeat BMP 3. Patient advised for 1500 cc fluid restriction 4. Discontinued Lexapro on discharge   Discharge Diagnoses:  Principal Problem:   Hyponatremia Active Problems:   Dyslipidemia   COPD (chronic obstructive pulmonary disease) (HCC)   GERD   Osteoarthritis   Osteoporosis   Chronic combined systolic and diastolic CHF, NYHA class 1 (HCC)   Acute renal failure superimposed on stage 3 chronic kidney disease (HCC)   PVD (peripheral vascular disease) (HCC)   Acute hyperkalemia   Fall with injury   Generalized weakness   Essential hypertension  Discharge Condition: stable  Diet recommendation: heart healthy, low salt, fluid restricted  Filed Weights   10/31/15 0534 11/01/15 0533 11/02/15 0527  Weight: 80.5 kg (177 lb 7.5 oz) 80.831 kg (178 lb 3.2 oz) 80.7 kg (177 lb 14.6 oz)    History of present illness:  See H&P, Labs, Consult and Test reports for all details in brief, patient is a 82 y.o. female with medical history significant of chronic hyponatremia (baseline of sodium is 125-130), hypertension, hyperlipidemia, GERD, depression, anxiety, hearing loss, PVD, breast cancer (S/P mastectomy), chronic combined systolic and diastolic congestive heart failure, chronic kidney disease-stage III, who was admitted on 4/18 with hyponatremia  Hospital Course Hyponatremia - Acute on chronic, sodium 1 admission was 121, patient looked fluid overloaded on exam with lower extremity edema, JVD and chest x-ray with cardiomegaly and small pleural effusions so likely in the setting of fluid overload, corroborated by low serum and urine osmolality. Patient was started on IV Lasix with improvement in her fluid status  and improvement in her Na. Patient will need to adhere to fluid restriction. While in the hospital on 2/20-2/21 per her own report she drank several jugs of water and her Na was starting to decrease again and her weight to go up. She was placed again on IV Lasix and fluid restriction was re-enforced with improvement in her Na levels on discharge. Discussed extensively with patient regarding this. Her Lexapro might contribute as well and was discontinue on discharge.  Hyperkalemia - Improved AoCKD-III - Creatinine stable, improving Acute on chronic combined systolic and diastolic CHF - 2-D echo on 01/19/15 showed EF of 40-45% with grade 1 diastolic dysfunction. Recommended daily weights, fluid restriction HLD - last LDL was 55 on 04/03/14, Continue home medications: pravastatin GERD- Pepcid HTN  COPD (chronic obstructive pulmonary disease) (HCC) - stable Depression - hold Lexapro for now Falls at home / excessive sleepiness - Patient has been having excessive daytime sleepiness at home, the daughter states that she often falls asleep at random times. She fell last week while being on the commode, requiring an ED visit and stitches. I wonder whether she has a degree of obstructive sleep apnea and may need additional studies as outpatient  Procedures:  None    Consultations:  None   Discharge Exam: Filed Vitals:   11/01/15 2313 11/02/15 0527 11/02/15 0823 11/02/15 1344  BP: 103/48 131/64 120/71 125/76  Pulse: 69 79 90 92  Temp: 98.2 F (36.8 C) 98.3 F (36.8 C)  98.6 F (37 C)  TempSrc: Oral Oral  Oral  Resp: 20 20  19   Height:      Weight:  80.7 kg (177  lb 14.6 oz)    SpO2: 94% 91%  92%   General: NAD Cardiovascular: RRR without MRG Respiratory: CTA biL  Discharge Instructions Activity:  As tolerated   Get Medicines reviewed and adjusted: Please take all your medications with you for your next visit with your Primary MD  Please request your Primary MD to go over all hospital  tests and procedure/radiological results at the follow up, please ask your Primary MD to get all Hospital records sent to his/her office.  If you experience worsening of your admission symptoms, develop shortness of breath, life threatening emergency, suicidal or homicidal thoughts you must seek medical attention immediately by calling 911 or calling your MD immediately if symptoms less severe.  You must read complete instructions/literature along with all the possible adverse reactions/side effects for all the Medicines you take and that have been prescribed to you. Take any new Medicines after you have completely understood and accpet all the possible adverse reactions/side effects.   Do not drive when taking Pain medications.   Do not take more than prescribed Pain, Sleep and Anxiety Medications  Special Instructions: If you have smoked or chewed Tobacco in the last 2 yrs please stop smoking, stop any regular Alcohol and or any Recreational drug use.  Wear Seat belts while driving.  Please note  You were cared for by a hospitalist during your hospital stay. Once you are discharged, your primary care physician will handle any further medical issues. Please note that NO REFILLS for any discharge medications will be authorized once you are discharged, as it is imperative that you return to your primary care physician (or establish a relationship with a primary care physician if you do not have one) for your aftercare needs so that they can reassess your need for medications and monitor your lab values.    Medication List    STOP taking these medications        escitalopram 10 MG tablet  Commonly known as:  LEXAPRO     sulfamethoxazole-trimethoprim 800-160 MG tablet  Commonly known as:  BACTRIM DS,SEPTRA DS      TAKE these medications        acetaminophen 325 MG tablet  Commonly known as:  TYLENOL  Take 2 tablets (650 mg total) by mouth every 4 (four) hours as needed for headache  or mild pain.     albuterol (2.5 MG/3ML) 0.083% nebulizer solution  Commonly known as:  PROVENTIL  Take 3 mLs (2.5 mg total) by nebulization every 6 (six) hours as needed for wheezing or shortness of breath.     albuterol 108 (90 Base) MCG/ACT inhaler  Commonly known as:  PROVENTIL HFA;VENTOLIN HFA  Inhale 2 puffs into the lungs every 6 (six) hours as needed for wheezing or shortness of breath.     aspirin 81 MG tablet  Take 81 mg by mouth daily after lunch.     CALTRATE 600+D PO  Take 1 tablet by mouth daily after lunch.     carvedilol 12.5 MG tablet  Commonly known as:  COREG  Take 1 tablet (12.5 mg total) by mouth 2 (two) times daily with a meal.     denosumab 60 MG/ML Soln injection  Commonly known as:  PROLIA  Inject 60 mg into the skin every 6 (six) months. Administer in upper arm, thigh, or abdomen     famotidine 20 MG tablet  Commonly known as:  PEPCID  Take 1 tablet (20 mg total) by mouth 2 (two) times  daily.     furosemide 40 MG tablet  Commonly known as:  LASIX  Take 1 tablet (40 mg total) by mouth 2 (two) times daily.     gabapentin 100 MG capsule  Commonly known as:  NEURONTIN  Take 1 capsule (100 mg total) by mouth 2 (two) times daily.     levocetirizine 5 MG tablet  Commonly known as:  XYZAL  Take 5 mg by mouth every evening.     losartan 50 MG tablet  Commonly known as:  COZAAR  TAKE 1 TABLET BY MOUTH DAILY     montelukast 10 MG tablet  Commonly known as:  SINGULAIR  TAKE 1 TABLET BY MOUTH DAILY     multivitamin with minerals Tabs tablet  Take 1 tablet by mouth daily after lunch.     oxyCODONE-acetaminophen 5-325 MG tablet  Commonly known as:  PERCOCET/ROXICET  Take 1-2 tablets by mouth every 4 (four) hours as needed.     potassium chloride SA 20 MEQ tablet  Commonly known as:  K-DUR,KLOR-CON  Take 1 tablet (20 mEq total) by mouth 2 (two) times daily.     pravastatin 80 MG tablet  Commonly known as:  PRAVACHOL  Take 1 tablet by mouth  daily      spironolactone 25 MG tablet  Commonly known as:  ALDACTONE  Take 1 tablet by mouth  daily     vitamin B-12 500 MCG tablet  Commonly known as:  CYANOCOBALAMIN  Take 1,000 mcg by mouth 3 (three) times a week. Mon, Wed, Fri           Follow-up Information    Follow up with [facility].   Why:  Resume HH PT, RN HHA   Contact information:   [address] [place] [address] [phone]       Follow up with [name], MD In 1 week.   Specialties:  Internal Medicine, Pediatrics   Why:  for blood work   Contact information:   [place] [address] [phone]       The results of significant diagnostics from this hospitalization (including imaging, microbiology, ancillary and laboratory) are listed below for reference.    Significant Diagnostic Studies: Dg Chest 2 View  10/29/2015  CLINICAL DATA:  Shortness of breath for 4 days. EXAM: CHEST  2 VIEW COMPARISON:  Radiographs most recently 10/26/2015, CT 09/25/2015 FINDINGS: Cardiomegaly and tortuous thoracic aorta unchanged. Small bilateral pleural effusions are unchanged. Linear atelectasis or scarring at the right lung base again seen. No pulmonary edema or consolidation. Compression deformities in the and lower midthoracic spine and exaggerated thoracic kyphosis are unchanged. IMPRESSION: 1. No acute abnormality. 2. Stable cardiomegaly and small pleural effusions. Unchanged atelectasis or scarring in the right lower lobe. Electronically Signed   By: Jeb Levering M.D.   On: 10/29/2015 20:53   Microbiology: Recent Results (from the past 240 hour(s))  Urine culture     Status: None   Collection Time: 10/29/15  6:49 PM  Result Value Ref Range Status   Specimen Description URINE, RANDOM  Final   Special Requests NONE  Final   Culture MULTIPLE SPECIES PRESENT, SUGGEST RECOLLECTION  Final   Report Status 11/01/2015 FINAL  Final     Labs: Basic Metabolic Panel:  Recent  Labs Lab 10/29/15 1105 10/29/15 1715 10/30/15 0148 10/30/15 0742 10/30/15 1328 10/30/15 2154 10/31/15 0522 11/01/15 0618 11/01/15 1554 11/02/15 0550  NA 118* 121* 122* 125* 126* 125*  127* 125* 125* 127*  K 5.1 5.3* 4.8 4.6 4.2 4.0 4.0 4.6 4.8 4.6  CL 88* 90* 91* 94* 93* 91* 92* 88* 88* 90*  CO2 26 21* 21* 22 22 25 25 28 28 28   GLUCOSE 106* 89 106* 93 142* 116* 95 103* 90 93  BUN 27* 27* 29* 25* 22* 24* 23* 17 16 18   CREATININE 1.78* 1.69* 1.71* 1.45* 1.40* 1.56* 1.28* 1.16* 1.16* 1.21*  CALCIUM 8.4 8.3* 8.3* 8.1* 8.4* 8.3* 8.4* 8.5* 8.7* 8.9  PHOS 4.1  --   --   --   --   --   --   --   --   --    Liver Function Tests:  Recent Labs Lab 10/29/15 1105 10/29/15 1715  AST  --  19  ALT  --  16  ALKPHOS  --  54  BILITOT  --  0.3  PROT  --  5.8*  ALBUMIN 3.5 3.3*   CBC:  Recent Labs Lab 10/29/15 1715 10/30/15 0148  WBC 6.8 7.2  HGB 9.1* 8.7*  HCT 26.0* 25.6*  MCV 94.9 94.8  PLT 291 293   BNP: BNP (last 3 results)  Recent Labs  01/23/15 0535 09/27/15 0435 10/30/15 0148  BNP 990.7* 444.1* 157.0*    Signed:  Aamari Strawderman  [facility] 11/02/2015, 3:19 PM

## 2015-11-04 ENCOUNTER — Telehealth: Payer: Self-pay

## 2015-11-04 ENCOUNTER — Telehealth: Payer: Self-pay | Admitting: *Deleted

## 2015-11-04 DIAGNOSIS — J441 Chronic obstructive pulmonary disease with (acute) exacerbation: Secondary | ICD-10-CM | POA: Diagnosis not present

## 2015-11-04 DIAGNOSIS — I5043 Acute on chronic combined systolic (congestive) and diastolic (congestive) heart failure: Secondary | ICD-10-CM | POA: Diagnosis not present

## 2015-11-04 DIAGNOSIS — E871 Hypo-osmolality and hyponatremia: Secondary | ICD-10-CM | POA: Diagnosis not present

## 2015-11-04 DIAGNOSIS — E278 Other specified disorders of adrenal gland: Secondary | ICD-10-CM | POA: Diagnosis not present

## 2015-11-04 DIAGNOSIS — N183 Chronic kidney disease, stage 3 (moderate): Secondary | ICD-10-CM | POA: Diagnosis not present

## 2015-11-04 DIAGNOSIS — J440 Chronic obstructive pulmonary disease with acute lower respiratory infection: Secondary | ICD-10-CM | POA: Diagnosis not present

## 2015-11-04 DIAGNOSIS — M4800 Spinal stenosis, site unspecified: Secondary | ICD-10-CM | POA: Diagnosis not present

## 2015-11-04 DIAGNOSIS — I739 Peripheral vascular disease, unspecified: Secondary | ICD-10-CM | POA: Diagnosis not present

## 2015-11-04 DIAGNOSIS — I130 Hypertensive heart and chronic kidney disease with heart failure and stage 1 through stage 4 chronic kidney disease, or unspecified chronic kidney disease: Secondary | ICD-10-CM | POA: Diagnosis not present

## 2015-11-04 DIAGNOSIS — G629 Polyneuropathy, unspecified: Secondary | ICD-10-CM | POA: Diagnosis not present

## 2015-11-04 DIAGNOSIS — I2510 Atherosclerotic heart disease of native coronary artery without angina pectoris: Secondary | ICD-10-CM | POA: Diagnosis not present

## 2015-11-04 DIAGNOSIS — J209 Acute bronchitis, unspecified: Secondary | ICD-10-CM | POA: Diagnosis not present

## 2015-11-04 NOTE — Telephone Encounter (Signed)
Transitional care call attempted.  Left message for patient to return call. 

## 2015-11-04 NOTE — Telephone Encounter (Signed)
Hold carvedilol and losartan if systolic BP under 90

## 2015-11-04 NOTE — Telephone Encounter (Signed)
Ria Comment from [facility] called to try and set up parameters for patient's BP meds. She was D/C from the hospital Saturday. They stopped her Lexapro because they said it can cause edema. Her blood pressure has been running low. Today it was 100/62 and 92/50. Pt is taking her metoprolol even with the lower numbers. Please advise how she should be taking her blood pressure meds.

## 2015-11-05 ENCOUNTER — Other Ambulatory Visit: Payer: Self-pay | Admitting: *Deleted

## 2015-11-05 DIAGNOSIS — E871 Hypo-osmolality and hyponatremia: Secondary | ICD-10-CM | POA: Diagnosis not present

## 2015-11-05 DIAGNOSIS — G629 Polyneuropathy, unspecified: Secondary | ICD-10-CM | POA: Diagnosis not present

## 2015-11-05 DIAGNOSIS — J209 Acute bronchitis, unspecified: Secondary | ICD-10-CM | POA: Diagnosis not present

## 2015-11-05 DIAGNOSIS — N183 Chronic kidney disease, stage 3 (moderate): Secondary | ICD-10-CM | POA: Diagnosis not present

## 2015-11-05 DIAGNOSIS — M4800 Spinal stenosis, site unspecified: Secondary | ICD-10-CM | POA: Diagnosis not present

## 2015-11-05 DIAGNOSIS — I739 Peripheral vascular disease, unspecified: Secondary | ICD-10-CM | POA: Diagnosis not present

## 2015-11-05 DIAGNOSIS — I2510 Atherosclerotic heart disease of native coronary artery without angina pectoris: Secondary | ICD-10-CM | POA: Diagnosis not present

## 2015-11-05 DIAGNOSIS — J440 Chronic obstructive pulmonary disease with acute lower respiratory infection: Secondary | ICD-10-CM | POA: Diagnosis not present

## 2015-11-05 DIAGNOSIS — J441 Chronic obstructive pulmonary disease with (acute) exacerbation: Secondary | ICD-10-CM | POA: Diagnosis not present

## 2015-11-05 DIAGNOSIS — I130 Hypertensive heart and chronic kidney disease with heart failure and stage 1 through stage 4 chronic kidney disease, or unspecified chronic kidney disease: Secondary | ICD-10-CM | POA: Diagnosis not present

## 2015-11-05 DIAGNOSIS — I5043 Acute on chronic combined systolic (congestive) and diastolic (congestive) heart failure: Secondary | ICD-10-CM | POA: Diagnosis not present

## 2015-11-05 DIAGNOSIS — E278 Other specified disorders of adrenal gland: Secondary | ICD-10-CM | POA: Diagnosis not present

## 2015-11-05 NOTE — Telephone Encounter (Signed)
Ms. Gruwell returned your call. Please call back [phone].

## 2015-11-05 NOTE — Telephone Encounter (Signed)
Left detailed message on Haley Harris's voice mail because it confirmed her name in her own voice.

## 2015-11-05 NOTE — Patient Outreach (Addendum)
Transition of care call (discharged 4/22, week 1).   Spoke with pt, HIPPA verified, informed pt covering for [employer] CM.   Pt reports today  very shaky, BP has been running low, [place] nurse here yesterday and called MD, waiting to hear back from them.. RN CM informed pt per MD note in Epic- pt to hold Carvedilol and Losartan if BP under 90, but it would still be good to call MD office.   Pt reports her BP this am was 92/65-66, get up shaky, did not take the Carvedilol.  Pt reports she is on fluid restriction, no more than 6 cups a day- reason for hospitalization was drinking too much water, sodium level dropped.  Pt reports no edema,little sob at the end of the day.   Pt reports does not have a f/u appointment with Dr. Silvio Pate, after discussing with RN CM will make one when MD office calls back.   Note- MD office called during transition of care call - pt reports she was instructed to call MD office if systolic BP 0000000 plus f/u MD appointment was scheduled for 4/27.   Discussed with pt Richarda Osmond RN CM will be f/u with her next week as part of ongoing transition of care.  RN CM provided pt with name and contact number to call.    Plan to provide update to  [place].   [facility]  [phone]

## 2015-11-05 NOTE — Telephone Encounter (Signed)
Transition Care Management Follow-up Telephone Call   Date discharged? [id number]   How have you been since you were released from the hospital? Elevated BP, chills   Do you understand why you were in the hospital? yes   Do you understand the discharge instructions? yes   Where were you discharged to? home   Items Reviewed:  Medications reviewed: yes  Allergies reviewed: yes  Dietary changes reviewed: no  Referrals reviewed: home health   Functional Questionnaire:   Activities of Daily Living (ADLs):   She states they are independent in the following: ambulation, bathing and hygiene, feeding, continence, grooming, toileting and dressing States they require assistance with the following: none   Any transportation issues/concerns?: no   Any patient concerns? yes, Lexapro was discontinued while inpatient with no replacement given   Confirmed importance and date/time of follow-up visits scheduled yes, 11/07/15 @ 1500  Provider Appointment booked with [name], MD  Confirmed with patient if condition begins to worsen call PCP or go to the ER.  Patient was given the office number and encouraged to call back with question or concerns.  : yes

## 2015-11-06 DIAGNOSIS — E871 Hypo-osmolality and hyponatremia: Secondary | ICD-10-CM | POA: Diagnosis not present

## 2015-11-06 DIAGNOSIS — N183 Chronic kidney disease, stage 3 (moderate): Secondary | ICD-10-CM | POA: Diagnosis not present

## 2015-11-06 DIAGNOSIS — J440 Chronic obstructive pulmonary disease with acute lower respiratory infection: Secondary | ICD-10-CM | POA: Diagnosis not present

## 2015-11-06 DIAGNOSIS — I130 Hypertensive heart and chronic kidney disease with heart failure and stage 1 through stage 4 chronic kidney disease, or unspecified chronic kidney disease: Secondary | ICD-10-CM | POA: Diagnosis not present

## 2015-11-06 DIAGNOSIS — G629 Polyneuropathy, unspecified: Secondary | ICD-10-CM | POA: Diagnosis not present

## 2015-11-06 DIAGNOSIS — I5043 Acute on chronic combined systolic (congestive) and diastolic (congestive) heart failure: Secondary | ICD-10-CM | POA: Diagnosis not present

## 2015-11-06 DIAGNOSIS — E278 Other specified disorders of adrenal gland: Secondary | ICD-10-CM | POA: Diagnosis not present

## 2015-11-06 DIAGNOSIS — M4800 Spinal stenosis, site unspecified: Secondary | ICD-10-CM | POA: Diagnosis not present

## 2015-11-06 DIAGNOSIS — J441 Chronic obstructive pulmonary disease with (acute) exacerbation: Secondary | ICD-10-CM | POA: Diagnosis not present

## 2015-11-06 DIAGNOSIS — J209 Acute bronchitis, unspecified: Secondary | ICD-10-CM | POA: Diagnosis not present

## 2015-11-06 DIAGNOSIS — I2510 Atherosclerotic heart disease of native coronary artery without angina pectoris: Secondary | ICD-10-CM | POA: Diagnosis not present

## 2015-11-06 DIAGNOSIS — I739 Peripheral vascular disease, unspecified: Secondary | ICD-10-CM | POA: Diagnosis not present

## 2015-11-06 NOTE — Progress Notes (Signed)
Per Dr Aronson peer to peer approved INPT 

## 2015-11-07 ENCOUNTER — Encounter: Payer: Self-pay | Admitting: Internal Medicine

## 2015-11-07 ENCOUNTER — Ambulatory Visit (INDEPENDENT_AMBULATORY_CARE_PROVIDER_SITE_OTHER): Payer: Medicare Other | Admitting: Internal Medicine

## 2015-11-07 VITALS — BP 120/82 | HR 72 | Temp 97.4°F | Wt 176.0 lb

## 2015-11-07 DIAGNOSIS — F39 Unspecified mood [affective] disorder: Secondary | ICD-10-CM | POA: Diagnosis not present

## 2015-11-07 DIAGNOSIS — M542 Cervicalgia: Secondary | ICD-10-CM | POA: Diagnosis not present

## 2015-11-07 DIAGNOSIS — E871 Hypo-osmolality and hyponatremia: Secondary | ICD-10-CM | POA: Diagnosis not present

## 2015-11-07 DIAGNOSIS — M810 Age-related osteoporosis without current pathological fracture: Secondary | ICD-10-CM | POA: Diagnosis not present

## 2015-11-07 DIAGNOSIS — I5042 Chronic combined systolic (congestive) and diastolic (congestive) heart failure: Secondary | ICD-10-CM | POA: Diagnosis not present

## 2015-11-07 DIAGNOSIS — M79632 Pain in left forearm: Secondary | ICD-10-CM | POA: Diagnosis not present

## 2015-11-07 DIAGNOSIS — S139XXD Sprain of joints and ligaments of unspecified parts of neck, subsequent encounter: Secondary | ICD-10-CM | POA: Diagnosis not present

## 2015-11-07 MED ORDER — DENOSUMAB 60 MG/ML ~~LOC~~ SOLN
60.0000 mg | Freq: Once | SUBCUTANEOUS | Status: AC
  Administered 2015-11-07: 60 mg via SUBCUTANEOUS

## 2015-11-07 NOTE — Progress Notes (Signed)
Pre visit review using our clinic review tool, if applicable. No additional management support is needed unless otherwise documented below in the visit note. 

## 2015-11-07 NOTE — Assessment & Plan Note (Signed)
Compensated based on symptoms but not at dry weight Will try to get her down to 170 or below

## 2015-11-07 NOTE — Assessment & Plan Note (Signed)
Excess free water Will increase the furosemide to bid (unless weight back down under 170#) Continue fluid restriction Probably not candidate for meds since not SIADH

## 2015-11-07 NOTE — Progress Notes (Signed)
Subjective:    Patient ID: Haley Harris, female    DOB: [birth year], 82 y.o.   MRN: [id number]  HPI Here with daughter and great granddaughter  Reviewed past hospitalization Sent there after my follow up visit due to sodium of 118 Lasix and fluid restriction Went up to 125 and was sent home escitalopram was stopped also. No withdrawal   Breathing is okay now--unless she does more BP low all day yesterday-- held carvedilol and losartan BP better today though Furosemide down to 40mg  daily Daily weights--- 175.5# today at home  Current Outpatient Prescriptions on File Prior to Visit  Medication Sig Dispense Refill  . acetaminophen (TYLENOL) 325 MG tablet Take 2 tablets (650 mg total) by mouth every 4 (four) hours as needed for headache or mild pain.    Marland Kitchen albuterol (PROVENTIL HFA;VENTOLIN HFA) 108 (90 Base) MCG/ACT inhaler Inhale 2 puffs into the lungs every 6 (six) hours as needed for wheezing or shortness of breath. 1 Inhaler 0  . albuterol (PROVENTIL) (2.5 MG/3ML) 0.083% nebulizer solution Take 3 mLs (2.5 mg total) by nebulization every 6 (six) hours as needed for wheezing or shortness of breath. 540 mL 3  . aspirin 81 MG tablet Take 81 mg by mouth daily after lunch.     . Calcium Carbonate-Vitamin D (CALTRATE 600+D PO) Take 1 tablet by mouth daily after lunch.     . carvedilol (COREG) 12.5 MG tablet Take 1 tablet (12.5 mg total) by mouth 2 (two) times daily with a meal. 180 tablet 1  . denosumab (PROLIA) 60 MG/ML SOLN injection Inject 60 mg into the skin every 6 (six) months. Reported on 11/05/2015    . famotidine (PEPCID) 20 MG tablet Take 1 tablet (20 mg total) by mouth 2 (two) times daily. 180 tablet 1  . furosemide (LASIX) 40 MG tablet Take 1 tablet (40 mg total) by mouth 2 (two) times daily. 60 tablet 0  . gabapentin (NEURONTIN) 100 MG capsule Take 1 capsule (100 mg total) by mouth 2 (two) times daily. 180 capsule 3  . levocetirizine (XYZAL) 5 MG tablet Take 5 mg by mouth every  evening.    Marland Kitchen losartan (COZAAR) 50 MG tablet TAKE 1 TABLET BY MOUTH DAILY 90 tablet 1  . montelukast (SINGULAIR) 10 MG tablet TAKE 1 TABLET BY MOUTH DAILY 90 tablet 1  . Multiple Vitamin (MULITIVITAMIN WITH MINERALS) TABS Take 1 tablet by mouth daily after lunch.     . pravastatin (PRAVACHOL) 80 MG tablet Take 1 tablet by mouth  daily 90 tablet 3  . spironolactone (ALDACTONE) 25 MG tablet Take 1 tablet by mouth  daily 90 tablet 3  . vitamin B-12 (CYANOCOBALAMIN) 500 MCG tablet Take 1,000 mcg by mouth 3 (three) times a week. Mon, Wed, Fri     No current facility-administered medications on file prior to visit.    Allergies  Allergen Reactions  . Amoxicillin-Pot Clavulanate Anaphylaxis  . Cephalexin Other (See Comments)    Reaction unknown  . Clarithromycin Other (See Comments)    Reaction unknown  . Lidocaine     Patient had tremors following use of lidocaine pain patches  . Nifedipine Other (See Comments)    Reaction unknown  . Nsaids Other (See Comments)    Heart issue  . Olmesartan Medoxomil     REACTION: cough;  But tolerating Losartan 02/2014  . Penicillins   . Tramadol Hcl Other (See Comments)    Reaction unknown    Past Medical History  Diagnosis  Date  . Allergic rhinitis, cause unspecified   . Unspecified cardiovascular disease   . Personal history of malignant neoplasm of breast   . Occlusion and stenosis of carotid artery without mention of cerebral infarction   . Chronic airway obstruction, not elsewhere classified   . Depressive disorder, not elsewhere classified   . Other dyspnea and respiratory abnormality   . Esophageal reflux   . Unspecified hearing loss   . Other and unspecified hyperlipidemia   . Unspecified essential hypertension   . Osteoporosis, unspecified   . Peripheral vascular disease, unspecified ([place])   . Unspecified urinary incontinence   . Spinal stenosis   . ACE-inhibitor cough   . Angina   . Heart murmur     aS CHILD  . Shortness of  breath   . Recurrent upper respiratory infection (URI)   . Anxiety   . Pneumonia   . 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 Myocardial infarct ([place])   . Osteoarthrosis, unspecified whether generalized or localized, unspecified site   . Malignant neoplasm of breast (female), unspecified site   . Compression fracture of T12 vertebra ([place]) 10/28/2011  . CHF (congestive heart failure) [facility])     Past Surgical History  Procedure Laterality Date  . Mastectomy  1987    Right  . Breast reconstruction  1998    Reconstruction   . Reduction mammaplasty  1998    Left  . Mastoidectomy  childhood  . Appendectomy  1948  . Lumbar laminectomy  2003  . Shoulder surgery  02/2005    Bilateral fractures with multiple surgeries  . Breast implant removal  06/12/09    right  . Abdominal hysterectomy    . Fracture surgery      fracture right elbow  . Coronary angioplasty  11/12    distal RCA  . Mastectomy    . Left heart catheterization with coronary angiogram N/A 06/05/2011    Procedure: LEFT HEART CATHETERIZATION WITH CORONARY ANGIOGRAM;  Surgeon: Clent Demark, MD;  Location: [facility] CATH LAB;  Service: Cardiovascular;  Laterality: N/A;  . Left heart catheterization with coronary angiogram N/A 03/05/2014    Procedure: LEFT HEART CATHETERIZATION WITH CORONARY ANGIOGRAM;  Surgeon: Clent Demark, MD;  Location: [facility] CATH LAB;  Service: Cardiovascular;  Laterality: N/A;  . Flexible sigmoidoscopy N/A 01/21/2015    Procedure: FLEXIBLE SIGMOIDOSCOPY;  Surgeon: Richmond Campbell, MD;  Location: [facility] ENDOSCOPY;  Service: Endoscopy;  Laterality: N/A;  . Flexible sigmoidoscopy  01/22/2015       . Cardiac catheterization N/A 01/25/2015    Procedure: Left Heart Cath and Coronary Angiography;  Surgeon: Charolette Forward, MD;  Location: [facility] [place] CV LAB;  Service: Cardiovascular;  Laterality: N/A;    Family History  Problem Relation Age of Onset  . Heart attack Father 81  . Cancer Mother     ovarian    . Cancer Brother     lung  . Arthritis      family    Social History   Social History  . Marital Status: Widowed    Spouse Name: N/A  . Number of Children: 2  . Years of Education: N/A   Occupational History  . 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     Social History Main Topics  . Smoking status: Former Smoker    Quit date: 07/13/1974  . Smokeless tobacco: Never Used  . Alcohol Use: 0.0 oz/week    0  Standard drinks or equivalent per week     Comment: occasional  . Drug Use: No  . Sexual Activity: No   Other Topics Concern  . Not on file   Social History Narrative   Has living will   Son or daughter would be health care POA.   Requests DNR--written 03/02/13   No tube feeds if cognitively unaware   Review of Systems Eating well Saw Dr Louanne Skye today for her neck---worsening kyphosis (from compression fractures) Due for her prolia Sleeping fair--bad night last night---,may have been from anticipation of early doctor's apointment Very congested from allergies--some yellow nasal drainage in past day    Objective:   Physical Exam  Cardiovascular: Normal rate, regular rhythm and normal heart sounds.  Exam reveals no gallop.   No murmur heard. Pulmonary/Chest: Effort normal. No respiratory distress. She has no wheezes. She has no rales.  Decreased breath sounds but clear  Musculoskeletal:  1+ edema bilaterally  Psychiatric: She has a normal mood and affect. Her behavior is normal.          Assessment & Plan:

## 2015-11-07 NOTE — Addendum Note (Signed)
Addended by: Royann Shivers A on: 11/07/2015 03:51 PM   Modules accepted: Orders

## 2015-11-07 NOTE — Assessment & Plan Note (Signed)
escitalopram stopped in case contributing to hyponatremia Mood okay now Probably okay to restart if mood worsens

## 2015-11-07 NOTE — Addendum Note (Signed)
Addended by: Pilar Grammes on: 11/07/2015 03:43 PM   Modules accepted: Orders

## 2015-11-08 DIAGNOSIS — J209 Acute bronchitis, unspecified: Secondary | ICD-10-CM | POA: Diagnosis not present

## 2015-11-08 DIAGNOSIS — I2510 Atherosclerotic heart disease of native coronary artery without angina pectoris: Secondary | ICD-10-CM | POA: Diagnosis not present

## 2015-11-08 DIAGNOSIS — I5043 Acute on chronic combined systolic (congestive) and diastolic (congestive) heart failure: Secondary | ICD-10-CM | POA: Diagnosis not present

## 2015-11-08 DIAGNOSIS — G629 Polyneuropathy, unspecified: Secondary | ICD-10-CM | POA: Diagnosis not present

## 2015-11-08 DIAGNOSIS — J441 Chronic obstructive pulmonary disease with (acute) exacerbation: Secondary | ICD-10-CM | POA: Diagnosis not present

## 2015-11-08 DIAGNOSIS — E278 Other specified disorders of adrenal gland: Secondary | ICD-10-CM | POA: Diagnosis not present

## 2015-11-08 DIAGNOSIS — J440 Chronic obstructive pulmonary disease with acute lower respiratory infection: Secondary | ICD-10-CM | POA: Diagnosis not present

## 2015-11-08 DIAGNOSIS — I739 Peripheral vascular disease, unspecified: Secondary | ICD-10-CM | POA: Diagnosis not present

## 2015-11-08 DIAGNOSIS — E871 Hypo-osmolality and hyponatremia: Secondary | ICD-10-CM | POA: Diagnosis not present

## 2015-11-08 DIAGNOSIS — M4800 Spinal stenosis, site unspecified: Secondary | ICD-10-CM | POA: Diagnosis not present

## 2015-11-08 DIAGNOSIS — N183 Chronic kidney disease, stage 3 (moderate): Secondary | ICD-10-CM | POA: Diagnosis not present

## 2015-11-08 DIAGNOSIS — I130 Hypertensive heart and chronic kidney disease with heart failure and stage 1 through stage 4 chronic kidney disease, or unspecified chronic kidney disease: Secondary | ICD-10-CM | POA: Diagnosis not present

## 2015-11-08 LAB — RENAL FUNCTION PANEL
Albumin: 3.8 g/dL (ref 3.5–5.2)
BUN: 27 mg/dL — ABNORMAL HIGH (ref 6–23)
CO2: 35 mEq/L — ABNORMAL HIGH (ref 19–32)
CREATININE: 1.24 mg/dL — AB (ref 0.40–1.20)
Calcium: 9.2 mg/dL (ref 8.4–10.5)
Chloride: 93 mEq/L — ABNORMAL LOW (ref 96–112)
GFR: 43.73 mL/min — AB (ref >60.00)
GLUCOSE: 76 mg/dL (ref 70–99)
PHOSPHORUS: 4.7 mg/dL — AB (ref 2.3–4.6)
POTASSIUM: 4.2 meq/L (ref 3.5–5.1)
SODIUM: 133 meq/L — AB (ref 135–145)

## 2015-11-12 ENCOUNTER — Other Ambulatory Visit: Payer: Self-pay | Admitting: *Deleted

## 2015-11-12 DIAGNOSIS — J441 Chronic obstructive pulmonary disease with (acute) exacerbation: Secondary | ICD-10-CM | POA: Diagnosis not present

## 2015-11-12 DIAGNOSIS — I739 Peripheral vascular disease, unspecified: Secondary | ICD-10-CM | POA: Diagnosis not present

## 2015-11-12 DIAGNOSIS — E278 Other specified disorders of adrenal gland: Secondary | ICD-10-CM | POA: Diagnosis not present

## 2015-11-12 DIAGNOSIS — G629 Polyneuropathy, unspecified: Secondary | ICD-10-CM | POA: Diagnosis not present

## 2015-11-12 DIAGNOSIS — I130 Hypertensive heart and chronic kidney disease with heart failure and stage 1 through stage 4 chronic kidney disease, or unspecified chronic kidney disease: Secondary | ICD-10-CM | POA: Diagnosis not present

## 2015-11-12 DIAGNOSIS — J209 Acute bronchitis, unspecified: Secondary | ICD-10-CM | POA: Diagnosis not present

## 2015-11-12 DIAGNOSIS — J440 Chronic obstructive pulmonary disease with acute lower respiratory infection: Secondary | ICD-10-CM | POA: Diagnosis not present

## 2015-11-12 DIAGNOSIS — M4800 Spinal stenosis, site unspecified: Secondary | ICD-10-CM | POA: Diagnosis not present

## 2015-11-12 DIAGNOSIS — I5043 Acute on chronic combined systolic (congestive) and diastolic (congestive) heart failure: Secondary | ICD-10-CM | POA: Diagnosis not present

## 2015-11-12 DIAGNOSIS — E871 Hypo-osmolality and hyponatremia: Secondary | ICD-10-CM | POA: Diagnosis not present

## 2015-11-12 DIAGNOSIS — I2510 Atherosclerotic heart disease of native coronary artery without angina pectoris: Secondary | ICD-10-CM | POA: Diagnosis not present

## 2015-11-12 DIAGNOSIS — N183 Chronic kidney disease, stage 3 (moderate): Secondary | ICD-10-CM | POA: Diagnosis not present

## 2015-11-12 NOTE — Patient Outreach (Signed)
[facility]) Care Management [place] Telephone outreach, Transition of Care, day 8 11/12/2015  LORRE HANLY Dec 12, 1928 [id number]  Successful telephone outreach to JENIAH LACHARITE is an 82 y/o female referred to [place] for transition of care after recent IP hospital visit March 17-29, 2017 for ARF with hypoxia related to flu and CHF.  Ms. Pedreira had a fall on 10-26-15 and went to the ED, where she got 50 stitches to her forehead lac. She was found in the ED to be hyponatremic, which worsened after she was discharged home; when she went to her PCP for F/U and her sodium level was found at that time to be 118, so she went back to the ED on the advice of her PCP. She was admitted to [place] at [facility] April 18-22, 2017. [facility] is continuing to follow Ms. Masley is for transition of care needs following her most recent hospital visit.  Today, Ms. Haliburton reports that she "doing much much better, just a little bit tired."  She reports that she has consistently followed up with her providers as scheduled, and states "they tell me my sodium level is stable."  Ms. Gores reports that she has continued monitoring and recording her BP at home, which had been running low post-hospital discharge November 02, 2015, stating, "my BP issues seem to be straightened out."  Ms. Choate states that her BP has been running 130-150/70-80.    Ms. Ahmadi reports that her weights have also been "holding steady, I weighed 174 pounds today."  Ms. Remsen acknowledges that her target weight is 170 pounds, and states that she has continued taking lasix twice a day, as advised by her PCP during their last OV.  Ms. Robbs states that her "legs are swollen in the calf," and confirms that she has been adhering to her fluid restriction, wearing compression hose, as well as keeping her legs elevated when she is at rest.  Ms. Minson reports that the forehead lac incurred during her fall is healing well, and that the  stitches "will dissolve on their own."  She reports that [facility] RN and PT are still involved in her care, and state that PT has advised her to "get up and move around every hour during daylight time," which she reports she is adhering to.  Ms. Iskhakov denies pain, needs, or questions.  We confirmed our previously schedule next home visit.  Plan: Ms. Donnally will continue actively participating in [facility] PT, and work with [facility] RN. Ms. Joswiak will continue monitoring and recording her daily weights, blood pressure, and pulse oximetry readings. Ms. Kunzler will continue taking her medications as prescribed. Ms. Sthill will continue attending all provider appointments as scheduled. Ms. Rattan will continue monitoring her fluid intake , wearing her compression hose, and elevating her legs when at rest. Ms. Monce will contact her medical providers/ team for any questions or problems that arise. Next [place] home visit planned for later this month.  Oneta Rack, RN, BSN, [employer] [facility] Care Management  [phone]

## 2015-11-13 ENCOUNTER — Ambulatory Visit (INDEPENDENT_AMBULATORY_CARE_PROVIDER_SITE_OTHER): Payer: Medicare Other | Admitting: Internal Medicine

## 2015-11-13 ENCOUNTER — Encounter: Payer: Self-pay | Admitting: Internal Medicine

## 2015-11-13 VITALS — BP 130/70 | HR 68 | Temp 97.5°F | Wt 175.0 lb

## 2015-11-13 DIAGNOSIS — J440 Chronic obstructive pulmonary disease with acute lower respiratory infection: Secondary | ICD-10-CM | POA: Diagnosis not present

## 2015-11-13 DIAGNOSIS — G629 Polyneuropathy, unspecified: Secondary | ICD-10-CM | POA: Diagnosis not present

## 2015-11-13 DIAGNOSIS — I872 Venous insufficiency (chronic) (peripheral): Secondary | ICD-10-CM

## 2015-11-13 DIAGNOSIS — M4800 Spinal stenosis, site unspecified: Secondary | ICD-10-CM | POA: Diagnosis not present

## 2015-11-13 DIAGNOSIS — J441 Chronic obstructive pulmonary disease with (acute) exacerbation: Secondary | ICD-10-CM | POA: Diagnosis not present

## 2015-11-13 DIAGNOSIS — I8312 Varicose veins of left lower extremity with inflammation: Secondary | ICD-10-CM | POA: Diagnosis not present

## 2015-11-13 DIAGNOSIS — J209 Acute bronchitis, unspecified: Secondary | ICD-10-CM | POA: Diagnosis not present

## 2015-11-13 DIAGNOSIS — E871 Hypo-osmolality and hyponatremia: Secondary | ICD-10-CM | POA: Diagnosis not present

## 2015-11-13 DIAGNOSIS — I130 Hypertensive heart and chronic kidney disease with heart failure and stage 1 through stage 4 chronic kidney disease, or unspecified chronic kidney disease: Secondary | ICD-10-CM | POA: Diagnosis not present

## 2015-11-13 DIAGNOSIS — N183 Chronic kidney disease, stage 3 (moderate): Secondary | ICD-10-CM | POA: Diagnosis not present

## 2015-11-13 DIAGNOSIS — I739 Peripheral vascular disease, unspecified: Secondary | ICD-10-CM | POA: Diagnosis not present

## 2015-11-13 DIAGNOSIS — F39 Unspecified mood [affective] disorder: Secondary | ICD-10-CM | POA: Diagnosis not present

## 2015-11-13 DIAGNOSIS — I5043 Acute on chronic combined systolic (congestive) and diastolic (congestive) heart failure: Secondary | ICD-10-CM

## 2015-11-13 DIAGNOSIS — I2510 Atherosclerotic heart disease of native coronary artery without angina pectoris: Secondary | ICD-10-CM | POA: Diagnosis not present

## 2015-11-13 DIAGNOSIS — J09X1 Influenza due to identified novel influenza A virus with pneumonia: Secondary | ICD-10-CM | POA: Diagnosis not present

## 2015-11-13 DIAGNOSIS — E278 Other specified disorders of adrenal gland: Secondary | ICD-10-CM | POA: Diagnosis not present

## 2015-11-13 NOTE — Assessment & Plan Note (Signed)
Reassured--no infection seen May need increased diuretics due to edema--will leave to cardiologist for now Continue the compression hose

## 2015-11-13 NOTE — Assessment & Plan Note (Signed)
Worse off the med Will have her restart the escitalopram

## 2015-11-13 NOTE — Progress Notes (Signed)
Subjective:    Patient ID: Haley Harris, female    DOB: 05/19/1929, 82 y.o.   MRN: [id number]  HPI Here with son due to possible leg infection  Legs are more swollen Despite twice a day furosemide Weight is about the same  Concern for infection in calves---so sent here at recommendation of her home health Right calf is painful Abnormal sensation in legs--doesn't really feel them when walking No fever  Wearing compression hose--but no other Rx  Feels her mood is worse Cries easily and irritable (overreacting to friends, etc)  Current Outpatient Prescriptions on File Prior to Visit  Medication Sig Dispense Refill  . acetaminophen (TYLENOL) 325 MG tablet Take 2 tablets (650 mg total) by mouth every 4 (four) hours as needed for headache or mild pain.    Marland Kitchen albuterol (PROVENTIL HFA;VENTOLIN HFA) 108 (90 Base) MCG/ACT inhaler Inhale 2 puffs into the lungs every 6 (six) hours as needed for wheezing or shortness of breath. 1 Inhaler 0  . albuterol (PROVENTIL) (2.5 MG/3ML) 0.083% nebulizer solution Take 3 mLs (2.5 mg total) by nebulization every 6 (six) hours as needed for wheezing or shortness of breath. 540 mL 3  . aspirin 81 MG tablet Take 81 mg by mouth daily after lunch.     . Calcium Carbonate-Vitamin D (CALTRATE 600+D PO) Take 1 tablet by mouth daily after lunch.     . carvedilol (COREG) 12.5 MG tablet Take 1 tablet (12.5 mg total) by mouth 2 (two) times daily with a meal. (Patient taking differently: Take 6.25 mg by mouth 2 (two) times daily with a meal. ) 180 tablet 1  . denosumab (PROLIA) 60 MG/ML SOLN injection Inject 60 mg into the skin every 6 (six) months. Reported on 11/05/2015    . famotidine (PEPCID) 20 MG tablet Take 1 tablet (20 mg total) by mouth 2 (two) times daily. 180 tablet 1  . furosemide (LASIX) 40 MG tablet Take 1 tablet (40 mg total) by mouth 2 (two) times daily. 60 tablet 0  . gabapentin (NEURONTIN) 100 MG capsule Take 1 capsule (100 mg total) by mouth 2 (two)  times daily. 180 capsule 3  . HYDROcodone-acetaminophen (NORCO/VICODIN) 5-325 MG tablet Take 1 tablet by mouth every 6 (six) hours as needed for moderate pain.    Marland Kitchen levocetirizine (XYZAL) 5 MG tablet Take 5 mg by mouth every evening.    Marland Kitchen losartan (COZAAR) 50 MG tablet TAKE 1 TABLET BY MOUTH DAILY 90 tablet 1  . montelukast (SINGULAIR) 10 MG tablet TAKE 1 TABLET BY MOUTH DAILY 90 tablet 1  . Multiple Vitamin (MULITIVITAMIN WITH MINERALS) TABS Take 1 tablet by mouth daily after lunch.     . pravastatin (PRAVACHOL) 80 MG tablet Take 1 tablet by mouth  daily 90 tablet 3  . spironolactone (ALDACTONE) 25 MG tablet Take 1 tablet by mouth  daily 90 tablet 3  . vitamin B-12 (CYANOCOBALAMIN) 500 MCG tablet Take 1,000 mcg by mouth 3 (three) times a week. Mon, Wed, Fri     No current facility-administered medications on file prior to visit.    Allergies  Allergen Reactions  . Amoxicillin-Pot Clavulanate Anaphylaxis  . Cephalexin Other (See Comments)    Reaction unknown  . Clarithromycin Other (See Comments)    Reaction unknown  . Lidocaine     Patient had tremors following use of lidocaine pain patches  . Nifedipine Other (See Comments)    Reaction unknown  . Nsaids Other (See Comments)    Heart issue  .  Olmesartan Medoxomil     REACTION: cough;  But tolerating Losartan 02/2014  . Penicillins   . Tramadol Hcl Other (See Comments)    Reaction unknown    Past Medical History  Diagnosis Date  . Allergic rhinitis, cause unspecified   . Unspecified cardiovascular disease   . Personal history of malignant neoplasm of breast   . Occlusion and stenosis of carotid artery without mention of cerebral infarction   . Chronic airway obstruction, not elsewhere classified   . Depressive disorder, not elsewhere classified   . Other dyspnea and respiratory abnormality   . Esophageal reflux   . Unspecified hearing loss   . Other and unspecified hyperlipidemia   . Unspecified essential hypertension   .  Osteoporosis, unspecified   . Peripheral vascular disease, unspecified ([place])   . Unspecified urinary incontinence   . Spinal stenosis   . ACE-inhibitor cough   . Angina   . Heart murmur     aS CHILD  . Shortness of breath   . Recurrent upper respiratory infection (URI)   . Anxiety   . Pneumonia   . 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 Myocardial infarct ([place])   . Osteoarthrosis, unspecified whether generalized or localized, unspecified site   . Malignant neoplasm of breast (female), unspecified site   . Compression fracture of T12 vertebra ([place]) 10/28/2011  . CHF (congestive heart failure) [facility])     Past Surgical History  Procedure Laterality Date  . Mastectomy  1987    Right  . Breast reconstruction  1998    Reconstruction   . Reduction mammaplasty  1998    Left  . Mastoidectomy  childhood  . Appendectomy  1948  . Lumbar laminectomy  2003  . Shoulder surgery  02/2005    Bilateral fractures with multiple surgeries  . Breast implant removal  06/12/09    right  . Abdominal hysterectomy    . Fracture surgery      fracture right elbow  . Coronary angioplasty  11/12    distal RCA  . Mastectomy    . Left heart catheterization with coronary angiogram N/A 06/05/2011    Procedure: LEFT HEART CATHETERIZATION WITH CORONARY ANGIOGRAM;  Surgeon: Clent Demark, MD;  Location: [facility] CATH LAB;  Service: Cardiovascular;  Laterality: N/A;  . Left heart catheterization with coronary angiogram N/A 03/05/2014    Procedure: LEFT HEART CATHETERIZATION WITH CORONARY ANGIOGRAM;  Surgeon: Clent Demark, MD;  Location: [facility] CATH LAB;  Service: Cardiovascular;  Laterality: N/A;  . Flexible sigmoidoscopy N/A 01/21/2015    Procedure: FLEXIBLE SIGMOIDOSCOPY;  Surgeon: Richmond Campbell, MD;  Location: [facility] ENDOSCOPY;  Service: Endoscopy;  Laterality: N/A;  . Flexible sigmoidoscopy  01/22/2015       . Cardiac catheterization N/A 01/25/2015    Procedure: Left Heart Cath and Coronary  Angiography;  Surgeon: Charolette Forward, MD;  Location: [place] CV LAB;  Service: Cardiovascular;  Laterality: N/A;    Family History  Problem Relation Age of Onset  . Heart attack Father 64  . Cancer Mother     ovarian  . Cancer Brother     lung  . Arthritis      family    Social History   Social History  . Marital Status: Widowed    Spouse Name: N/A  . Number of Children: 2  . Years of Education: N/A   Occupational History  . 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  Social History Main Topics  . Smoking status: Former Smoker    Quit date: 07/13/1974  . Smokeless tobacco: Never Used  . Alcohol Use: 0.0 oz/week    0 Standard drinks or equivalent per week     Comment: occasional  . Drug Use: No  . Sexual Activity: No   Other Topics Concern  . Not on file   Social History Narrative   Has living will   Son or daughter would be health care POA.   Requests DNR--written 03/02/13   No tube feeds if cognitively unaware   Review of Systems  Still restricting fluids Appetite is pretty good Not sleeping well--but is napping in daytime      Objective:   Physical Exam  Musculoskeletal:  1+ edema Faint redness in medial left calf Minimal on right calf Slight redness in toes  Psychiatric: She has a normal mood and affect. Her behavior is normal.          Assessment & Plan:

## 2015-11-13 NOTE — Progress Notes (Signed)
Pre visit review using our clinic review tool, if applicable. No additional management support is needed unless otherwise documented below in the visit note. 

## 2015-11-14 DIAGNOSIS — D649 Anemia, unspecified: Secondary | ICD-10-CM | POA: Diagnosis not present

## 2015-11-14 DIAGNOSIS — G629 Polyneuropathy, unspecified: Secondary | ICD-10-CM | POA: Diagnosis not present

## 2015-11-14 DIAGNOSIS — E871 Hypo-osmolality and hyponatremia: Secondary | ICD-10-CM | POA: Diagnosis not present

## 2015-11-14 DIAGNOSIS — M4800 Spinal stenosis, site unspecified: Secondary | ICD-10-CM | POA: Diagnosis not present

## 2015-11-14 DIAGNOSIS — I739 Peripheral vascular disease, unspecified: Secondary | ICD-10-CM | POA: Diagnosis not present

## 2015-11-14 DIAGNOSIS — I2510 Atherosclerotic heart disease of native coronary artery without angina pectoris: Secondary | ICD-10-CM | POA: Diagnosis not present

## 2015-11-14 DIAGNOSIS — E278 Other specified disorders of adrenal gland: Secondary | ICD-10-CM | POA: Diagnosis not present

## 2015-11-14 DIAGNOSIS — I119 Hypertensive heart disease without heart failure: Secondary | ICD-10-CM | POA: Diagnosis not present

## 2015-11-14 DIAGNOSIS — N183 Chronic kidney disease, stage 3 (moderate): Secondary | ICD-10-CM | POA: Diagnosis not present

## 2015-11-14 DIAGNOSIS — I5043 Acute on chronic combined systolic (congestive) and diastolic (congestive) heart failure: Secondary | ICD-10-CM | POA: Diagnosis not present

## 2015-11-14 DIAGNOSIS — I130 Hypertensive heart and chronic kidney disease with heart failure and stage 1 through stage 4 chronic kidney disease, or unspecified chronic kidney disease: Secondary | ICD-10-CM | POA: Diagnosis not present

## 2015-11-14 DIAGNOSIS — J441 Chronic obstructive pulmonary disease with (acute) exacerbation: Secondary | ICD-10-CM | POA: Diagnosis not present

## 2015-11-14 DIAGNOSIS — E785 Hyperlipidemia, unspecified: Secondary | ICD-10-CM | POA: Diagnosis not present

## 2015-11-14 DIAGNOSIS — J209 Acute bronchitis, unspecified: Secondary | ICD-10-CM | POA: Diagnosis not present

## 2015-11-14 DIAGNOSIS — I252 Old myocardial infarction: Secondary | ICD-10-CM | POA: Diagnosis not present

## 2015-11-14 DIAGNOSIS — J440 Chronic obstructive pulmonary disease with acute lower respiratory infection: Secondary | ICD-10-CM | POA: Diagnosis not present

## 2015-11-15 DIAGNOSIS — I2510 Atherosclerotic heart disease of native coronary artery without angina pectoris: Secondary | ICD-10-CM | POA: Diagnosis not present

## 2015-11-15 DIAGNOSIS — J209 Acute bronchitis, unspecified: Secondary | ICD-10-CM | POA: Diagnosis not present

## 2015-11-15 DIAGNOSIS — G629 Polyneuropathy, unspecified: Secondary | ICD-10-CM | POA: Diagnosis not present

## 2015-11-15 DIAGNOSIS — N183 Chronic kidney disease, stage 3 (moderate): Secondary | ICD-10-CM | POA: Diagnosis not present

## 2015-11-15 DIAGNOSIS — M4800 Spinal stenosis, site unspecified: Secondary | ICD-10-CM | POA: Diagnosis not present

## 2015-11-15 DIAGNOSIS — J441 Chronic obstructive pulmonary disease with (acute) exacerbation: Secondary | ICD-10-CM | POA: Diagnosis not present

## 2015-11-15 DIAGNOSIS — I739 Peripheral vascular disease, unspecified: Secondary | ICD-10-CM | POA: Diagnosis not present

## 2015-11-15 DIAGNOSIS — E871 Hypo-osmolality and hyponatremia: Secondary | ICD-10-CM | POA: Diagnosis not present

## 2015-11-15 DIAGNOSIS — J440 Chronic obstructive pulmonary disease with acute lower respiratory infection: Secondary | ICD-10-CM | POA: Diagnosis not present

## 2015-11-15 DIAGNOSIS — I5043 Acute on chronic combined systolic (congestive) and diastolic (congestive) heart failure: Secondary | ICD-10-CM | POA: Diagnosis not present

## 2015-11-15 DIAGNOSIS — I130 Hypertensive heart and chronic kidney disease with heart failure and stage 1 through stage 4 chronic kidney disease, or unspecified chronic kidney disease: Secondary | ICD-10-CM | POA: Diagnosis not present

## 2015-11-15 DIAGNOSIS — E278 Other specified disorders of adrenal gland: Secondary | ICD-10-CM | POA: Diagnosis not present

## 2015-11-18 DIAGNOSIS — N183 Chronic kidney disease, stage 3 (moderate): Secondary | ICD-10-CM | POA: Diagnosis not present

## 2015-11-18 DIAGNOSIS — J441 Chronic obstructive pulmonary disease with (acute) exacerbation: Secondary | ICD-10-CM | POA: Diagnosis not present

## 2015-11-18 DIAGNOSIS — M4800 Spinal stenosis, site unspecified: Secondary | ICD-10-CM | POA: Diagnosis not present

## 2015-11-18 DIAGNOSIS — G629 Polyneuropathy, unspecified: Secondary | ICD-10-CM | POA: Diagnosis not present

## 2015-11-18 DIAGNOSIS — I739 Peripheral vascular disease, unspecified: Secondary | ICD-10-CM | POA: Diagnosis not present

## 2015-11-18 DIAGNOSIS — E871 Hypo-osmolality and hyponatremia: Secondary | ICD-10-CM | POA: Diagnosis not present

## 2015-11-18 DIAGNOSIS — J209 Acute bronchitis, unspecified: Secondary | ICD-10-CM | POA: Diagnosis not present

## 2015-11-18 DIAGNOSIS — J440 Chronic obstructive pulmonary disease with acute lower respiratory infection: Secondary | ICD-10-CM | POA: Diagnosis not present

## 2015-11-18 DIAGNOSIS — E278 Other specified disorders of adrenal gland: Secondary | ICD-10-CM | POA: Diagnosis not present

## 2015-11-18 DIAGNOSIS — I130 Hypertensive heart and chronic kidney disease with heart failure and stage 1 through stage 4 chronic kidney disease, or unspecified chronic kidney disease: Secondary | ICD-10-CM | POA: Diagnosis not present

## 2015-11-18 DIAGNOSIS — I2510 Atherosclerotic heart disease of native coronary artery without angina pectoris: Secondary | ICD-10-CM | POA: Diagnosis not present

## 2015-11-18 DIAGNOSIS — I5043 Acute on chronic combined systolic (congestive) and diastolic (congestive) heart failure: Secondary | ICD-10-CM | POA: Diagnosis not present

## 2015-11-19 DIAGNOSIS — M4800 Spinal stenosis, site unspecified: Secondary | ICD-10-CM | POA: Diagnosis not present

## 2015-11-19 DIAGNOSIS — J440 Chronic obstructive pulmonary disease with acute lower respiratory infection: Secondary | ICD-10-CM | POA: Diagnosis not present

## 2015-11-19 DIAGNOSIS — E871 Hypo-osmolality and hyponatremia: Secondary | ICD-10-CM | POA: Diagnosis not present

## 2015-11-19 DIAGNOSIS — I739 Peripheral vascular disease, unspecified: Secondary | ICD-10-CM | POA: Diagnosis not present

## 2015-11-19 DIAGNOSIS — N183 Chronic kidney disease, stage 3 (moderate): Secondary | ICD-10-CM | POA: Diagnosis not present

## 2015-11-19 DIAGNOSIS — G629 Polyneuropathy, unspecified: Secondary | ICD-10-CM | POA: Diagnosis not present

## 2015-11-19 DIAGNOSIS — J209 Acute bronchitis, unspecified: Secondary | ICD-10-CM | POA: Diagnosis not present

## 2015-11-19 DIAGNOSIS — I5043 Acute on chronic combined systolic (congestive) and diastolic (congestive) heart failure: Secondary | ICD-10-CM | POA: Diagnosis not present

## 2015-11-19 DIAGNOSIS — I2510 Atherosclerotic heart disease of native coronary artery without angina pectoris: Secondary | ICD-10-CM | POA: Diagnosis not present

## 2015-11-19 DIAGNOSIS — I130 Hypertensive heart and chronic kidney disease with heart failure and stage 1 through stage 4 chronic kidney disease, or unspecified chronic kidney disease: Secondary | ICD-10-CM | POA: Diagnosis not present

## 2015-11-19 DIAGNOSIS — E278 Other specified disorders of adrenal gland: Secondary | ICD-10-CM | POA: Diagnosis not present

## 2015-11-19 DIAGNOSIS — J441 Chronic obstructive pulmonary disease with (acute) exacerbation: Secondary | ICD-10-CM | POA: Diagnosis not present

## 2015-11-20 ENCOUNTER — Other Ambulatory Visit: Payer: Self-pay | Admitting: *Deleted

## 2015-11-20 NOTE — Patient Outreach (Addendum)
[facility]) Care Management  [facility] CM Routine Home Visit, Transition of Care Day 16 11/20/2015  Haley Harris 1929-01-05 [id number]  Haley Harris is an 82 y.o. female referred to [place] for transition of care after recent IP hospital visit March 17-29, 2017 for ARF with hypoxia related to flu and CHF.   [name] experienced a fall on 10/26/15 and went to the ED, where she got 50 stitches to her incurred forehead lac.  She was found in the ED to be hyponatremic, which worsened after she was discharged home and was monitored by her PCP post-discharge; she returned to the ED on the advice of her PCP and was admitted as an IP April 18-22, 2017. [name] is continuing to follow [name] is for transition of care needs following her recent hospital visit, as well as for self-management of chronic disease state of CHF.  Today, [name] reports that she is still "doing much much better," after her hospital visit, but is experiencing increased back pain today after attending her granddaughter's recital last night where she sat on a "hard pew."  [name] reports that she plans to take one of her pain medications later in the afternoon to address the pain, stating, "that is the only thing that even touches" her pain.  During our home visit, [name] ambulates without difficulty using a rolling walker.  Today, [name] reports that she has consistently followed up with her providers as scheduled, and states "they are keeping a good close eye on me," stating that she is seeing her PCP every "couple of weeks."  [name] reports that she has continued taking her medications as prescribed, monitoring and recording her BP at home, (which had been running low post-hospital discharge), and monitoring and recording her daily weights, blood pressure, and pulse oximetry readings.    [name] reports that her medical providers have made changes to her BP medications  "which seem to be working." (See med rec).  [name] was recently discharged from hospital and all medications have been reviewed.  [name] a good understanding of the purpose, dosing, and scheduling of her prescribed medication.    Ms. Minix reports that her weights have also been "holding steady," stating that she weighed 170 pounds today, acknowledging that that is her target weight. [name] confirms that she has been adhering to her fluid restriction, wearing compression hose, as well as keeping her legs elevated when she is at rest.  She reports that [name] are still involved in her care, and stated that she has followed advice provided by [facility] PT to "get up and move around every hour during daylight time." [name] reports that staying active and getting out of the house makes her feel "so much better."  [name] confirmed that she has had no more falls since her discharge from the hospital.   [name] denies further concerns, needs, problems, or questions.     Subjective: "My blood pressure issues seem to be straightened out, and my sodium level is under control.  I feel like I am getting better now that they are under control."  Objective:    BP 118/68 mmHg  Pulse 94  Resp 16  Wt 170 lb 8 oz (77.338 kg)  SpO2 96%   Review of Systems  Constitutional: Positive for malaise/fatigue. Negative for fever and weight loss.  Respiratory: Negative.  Negative for cough, sputum  production, shortness of breath and wheezing.   Cardiovascular: Positive for leg swelling. Negative for chest pain.       Minimal (+1) bilateral ankle/ feet swelling; patient reports, "my feet aren't nearly as swollen as they usually are."  Gastrointestinal: Negative.   Genitourinary: Negative.   Musculoskeletal: Positive for back pain. Negative for falls.  Neurological: Negative.   Psychiatric/Behavioral: Negative.  Negative for depression. The patient is not nervous/anxious.     Physical  Exam  Constitutional: She is oriented to person, place, and time. She appears well-developed and well-nourished.  Cardiovascular: Normal rate, regular rhythm, normal heart sounds and intact distal pulses.   Respiratory: Effort normal and breath sounds normal. No respiratory distress. She has no wheezes. She has no rales.  GI: Soft. Bowel sounds are normal.  Musculoskeletal: She exhibits edema.  See ROS  Neurological: She is alert and oriented to person, place, and time.  Skin: Skin is warm and dry.  Patient's (L) forehead laceration is clean, dry, and intact, and appears to be healing well.  Stitches have dissolved and wound is well approximated and closed.  Psychiatric: She has a normal mood and affect. Her behavior is normal. Judgment and thought content normal.    Encounter Medications:   Outpatient Encounter Prescriptions as of 11/20/2015  Medication Sig Note  . acetaminophen (TYLENOL) 325 MG tablet Take 2 tablets (650 mg total) by mouth every 4 (four) hours as needed for headache or mild pain.   Marland Kitchen albuterol (PROVENTIL HFA;VENTOLIN HFA) 108 (90 Base) MCG/ACT inhaler Inhale 2 puffs into the lungs every 6 (six) hours as needed for wheezing or shortness of breath.   Marland Kitchen albuterol (PROVENTIL) (2.5 MG/3ML) 0.083% nebulizer solution Take 3 mLs (2.5 mg total) by nebulization every 6 (six) hours as needed for wheezing or shortness of breath.   Marland Kitchen aspirin 81 MG tablet Take 81 mg by mouth daily after lunch.  11/05/2015: Pt takes at night   . Calcium Carbonate-Vitamin D (CALTRATE 600+D PO) Take 1 tablet by mouth daily after lunch.    . carvedilol (COREG) 12.5 MG tablet Take 1 tablet (12.5 mg total) by mouth 2 (two) times daily with a meal. (Patient taking differently: Take 6.25 mg by mouth 2 (two) times daily with a meal. ) 11/05/2015: Held today because BP was low.   . denosumab (PROLIA) 60 MG/ML SOLN injection Inject 60 mg into the skin every 6 (six) months. Reported on 11/05/2015   . escitalopram  (LEXAPRO) 10 MG tablet Take 10 mg by mouth daily.   . famotidine (PEPCID) 20 MG tablet Take 1 tablet (20 mg total) by mouth 2 (two) times daily.   . furosemide (LASIX) 40 MG tablet Take 1 tablet (40 mg total) by mouth 2 (two) times daily. 11/05/2015: Pt reports taking lasix 40mg  once a day   . gabapentin (NEURONTIN) 100 MG capsule Take 1 capsule (100 mg total) by mouth 2 (two) times daily.   Marland Kitchen HYDROcodone-acetaminophen (NORCO/VICODIN) 5-325 MG tablet Take 1 tablet by mouth every 6 (six) hours as needed for moderate pain.   Marland Kitchen levocetirizine (XYZAL) 5 MG tablet Take 5 mg by mouth every evening.   Marland Kitchen losartan (COZAAR) 50 MG tablet TAKE 1 TABLET BY MOUTH DAILY   . montelukast (SINGULAIR) 10 MG tablet TAKE 1 TABLET BY MOUTH DAILY   . Multiple Vitamin (MULITIVITAMIN WITH MINERALS) TABS Take 1 tablet by mouth daily after lunch.    . pravastatin (PRAVACHOL) 80 MG tablet Take 1 tablet by mouth  daily   .  spironolactone (ALDACTONE) 25 MG tablet Take 1 tablet by mouth  daily   . vitamin B-12 (CYANOCOBALAMIN) 500 MCG tablet Take 1,000 mcg by mouth 3 (three) times a week. Mon, Wed, Fri    No facility-administered encounter medications on file as of 11/20/2015.     Assessment:  Ms. Virden is doing well in her recuperation form her recent hospitalization.  She continues to have excellent family support through both her daughter [name], whom she lives with, as well as with her extended family members that do not live with her. Ms. Psomas remains committed to maintaining her established good self-health habits, and would like to become stronger and more mobile as she continues recuperating from her recent illness and hospitalization.   Plan:  Ms. Criger will continue actively participating in [facility] PT, and working with [facility] RN. Ms. Tolefree will continue monitoring and recording her daily weights, blood pressure, and pulse oximetry readings. Ms. Glackin will continue taking her medications as prescribed and attending all  provider appointments as scheduled. Ms. Volkov will continue monitoring her fluid intake , wearing her compression hose, and elevating her legs when at rest. Ms. Amaral will contact her medical providers/ team for any concerns, questions or problems that arise. [place] telephone outreach planned for next week.   Oneta Rack, RN, BSN, [employer] [facility] Care Management  [phone]

## 2015-11-21 DIAGNOSIS — E278 Other specified disorders of adrenal gland: Secondary | ICD-10-CM | POA: Diagnosis not present

## 2015-11-21 DIAGNOSIS — I739 Peripheral vascular disease, unspecified: Secondary | ICD-10-CM | POA: Diagnosis not present

## 2015-11-21 DIAGNOSIS — G629 Polyneuropathy, unspecified: Secondary | ICD-10-CM | POA: Diagnosis not present

## 2015-11-21 DIAGNOSIS — N183 Chronic kidney disease, stage 3 (moderate): Secondary | ICD-10-CM | POA: Diagnosis not present

## 2015-11-21 DIAGNOSIS — J441 Chronic obstructive pulmonary disease with (acute) exacerbation: Secondary | ICD-10-CM | POA: Diagnosis not present

## 2015-11-21 DIAGNOSIS — J440 Chronic obstructive pulmonary disease with acute lower respiratory infection: Secondary | ICD-10-CM | POA: Diagnosis not present

## 2015-11-21 DIAGNOSIS — I5043 Acute on chronic combined systolic (congestive) and diastolic (congestive) heart failure: Secondary | ICD-10-CM | POA: Diagnosis not present

## 2015-11-21 DIAGNOSIS — M4800 Spinal stenosis, site unspecified: Secondary | ICD-10-CM | POA: Diagnosis not present

## 2015-11-21 DIAGNOSIS — E871 Hypo-osmolality and hyponatremia: Secondary | ICD-10-CM | POA: Diagnosis not present

## 2015-11-21 DIAGNOSIS — I130 Hypertensive heart and chronic kidney disease with heart failure and stage 1 through stage 4 chronic kidney disease, or unspecified chronic kidney disease: Secondary | ICD-10-CM | POA: Diagnosis not present

## 2015-11-21 DIAGNOSIS — I2510 Atherosclerotic heart disease of native coronary artery without angina pectoris: Secondary | ICD-10-CM | POA: Diagnosis not present

## 2015-11-21 DIAGNOSIS — J209 Acute bronchitis, unspecified: Secondary | ICD-10-CM | POA: Diagnosis not present

## 2015-11-25 ENCOUNTER — Other Ambulatory Visit: Payer: Self-pay | Admitting: *Deleted

## 2015-11-25 DIAGNOSIS — E871 Hypo-osmolality and hyponatremia: Secondary | ICD-10-CM | POA: Diagnosis not present

## 2015-11-25 DIAGNOSIS — E278 Other specified disorders of adrenal gland: Secondary | ICD-10-CM | POA: Diagnosis not present

## 2015-11-25 DIAGNOSIS — I5043 Acute on chronic combined systolic (congestive) and diastolic (congestive) heart failure: Secondary | ICD-10-CM | POA: Diagnosis not present

## 2015-11-25 DIAGNOSIS — J440 Chronic obstructive pulmonary disease with acute lower respiratory infection: Secondary | ICD-10-CM | POA: Diagnosis not present

## 2015-11-25 DIAGNOSIS — N183 Chronic kidney disease, stage 3 (moderate): Secondary | ICD-10-CM | POA: Diagnosis not present

## 2015-11-25 DIAGNOSIS — J441 Chronic obstructive pulmonary disease with (acute) exacerbation: Secondary | ICD-10-CM | POA: Diagnosis not present

## 2015-11-25 DIAGNOSIS — J209 Acute bronchitis, unspecified: Secondary | ICD-10-CM | POA: Diagnosis not present

## 2015-11-25 DIAGNOSIS — M4800 Spinal stenosis, site unspecified: Secondary | ICD-10-CM | POA: Diagnosis not present

## 2015-11-25 DIAGNOSIS — I739 Peripheral vascular disease, unspecified: Secondary | ICD-10-CM | POA: Diagnosis not present

## 2015-11-25 DIAGNOSIS — G629 Polyneuropathy, unspecified: Secondary | ICD-10-CM | POA: Diagnosis not present

## 2015-11-25 DIAGNOSIS — I130 Hypertensive heart and chronic kidney disease with heart failure and stage 1 through stage 4 chronic kidney disease, or unspecified chronic kidney disease: Secondary | ICD-10-CM | POA: Diagnosis not present

## 2015-11-25 DIAGNOSIS — I2510 Atherosclerotic heart disease of native coronary artery without angina pectoris: Secondary | ICD-10-CM | POA: Diagnosis not present

## 2015-11-25 NOTE — Patient Outreach (Signed)
[facility]) Care Management [place] Telephone Outreach, Transition of Care day 21 11/25/2015  Haley Harris 04/11/1929 [id number]  Successful telephone outreach to Haley Harris is an 82 y/o female referred to [place] for transition of care after two recent IP hospital visits, March 17-29, 2017 for ARF with hypoxia related to flu and CHF and again on April 18-22, 2017, for hyponatremia following an acute fall at home where she incurred 50 stitches to a forehead laceration.  Today, Ms. To reports that she "feels much better, and is finally rested up from my busy week last week." She reports today that she is continuing to work with [facility] PT and believes that she is "making really good progress."  Ms. Drummey confirms that she has continued following up with her providers as scheduled, monitoring and recording her BP and daily weights at home, and taking her medications as they are prescribed.  Ms. Groshek reports that her weight today was 169.5 pounds.  Ms. Cerezo denies pain, needs, or questions. Ms. Lemmo reported that she has a scheduled follow up appointment with her PCP, Dr. Silvio Pate on Friday of this week, and confirms that she plans on attending this visit.  Plan: Ms. Spratt will continue actively participating in [facility] PT, and work with [facility] RN. Ms. Spar will continue monitoring and recording her daily weights, blood pressure, and pulse oximetry readings. Ms. Lopezhernandez will continue taking her medications as prescribed. Ms. Parslow will continue attending all provider appointments as scheduled. Ms. Toalson will continue monitoring her fluid intake , wearing her compression hose, and elevating her legs when at rest. Ms. Ara will contact her medical providers/ team for any questions or problems that arise. [place] telephone outreach planned next week.  Oneta Rack, RN, BSN, [employer] [facility] Care Management  [phone]

## 2015-11-26 DIAGNOSIS — J441 Chronic obstructive pulmonary disease with (acute) exacerbation: Secondary | ICD-10-CM | POA: Diagnosis not present

## 2015-11-26 DIAGNOSIS — E278 Other specified disorders of adrenal gland: Secondary | ICD-10-CM | POA: Diagnosis not present

## 2015-11-26 DIAGNOSIS — I5043 Acute on chronic combined systolic (congestive) and diastolic (congestive) heart failure: Secondary | ICD-10-CM | POA: Diagnosis not present

## 2015-11-26 DIAGNOSIS — G629 Polyneuropathy, unspecified: Secondary | ICD-10-CM | POA: Diagnosis not present

## 2015-11-26 DIAGNOSIS — I130 Hypertensive heart and chronic kidney disease with heart failure and stage 1 through stage 4 chronic kidney disease, or unspecified chronic kidney disease: Secondary | ICD-10-CM | POA: Diagnosis not present

## 2015-11-26 DIAGNOSIS — I2510 Atherosclerotic heart disease of native coronary artery without angina pectoris: Secondary | ICD-10-CM | POA: Diagnosis not present

## 2015-11-26 DIAGNOSIS — I739 Peripheral vascular disease, unspecified: Secondary | ICD-10-CM | POA: Diagnosis not present

## 2015-11-26 DIAGNOSIS — J440 Chronic obstructive pulmonary disease with acute lower respiratory infection: Secondary | ICD-10-CM | POA: Diagnosis not present

## 2015-11-26 DIAGNOSIS — N183 Chronic kidney disease, stage 3 (moderate): Secondary | ICD-10-CM | POA: Diagnosis not present

## 2015-11-26 DIAGNOSIS — E871 Hypo-osmolality and hyponatremia: Secondary | ICD-10-CM | POA: Diagnosis not present

## 2015-11-26 DIAGNOSIS — J209 Acute bronchitis, unspecified: Secondary | ICD-10-CM | POA: Diagnosis not present

## 2015-11-26 DIAGNOSIS — M4800 Spinal stenosis, site unspecified: Secondary | ICD-10-CM | POA: Diagnosis not present

## 2015-11-28 DIAGNOSIS — I739 Peripheral vascular disease, unspecified: Secondary | ICD-10-CM | POA: Diagnosis not present

## 2015-11-28 DIAGNOSIS — J441 Chronic obstructive pulmonary disease with (acute) exacerbation: Secondary | ICD-10-CM | POA: Diagnosis not present

## 2015-11-28 DIAGNOSIS — J440 Chronic obstructive pulmonary disease with acute lower respiratory infection: Secondary | ICD-10-CM | POA: Diagnosis not present

## 2015-11-28 DIAGNOSIS — G629 Polyneuropathy, unspecified: Secondary | ICD-10-CM | POA: Diagnosis not present

## 2015-11-28 DIAGNOSIS — I130 Hypertensive heart and chronic kidney disease with heart failure and stage 1 through stage 4 chronic kidney disease, or unspecified chronic kidney disease: Secondary | ICD-10-CM | POA: Diagnosis not present

## 2015-11-28 DIAGNOSIS — I5043 Acute on chronic combined systolic (congestive) and diastolic (congestive) heart failure: Secondary | ICD-10-CM | POA: Diagnosis not present

## 2015-11-28 DIAGNOSIS — J209 Acute bronchitis, unspecified: Secondary | ICD-10-CM | POA: Diagnosis not present

## 2015-11-28 DIAGNOSIS — N183 Chronic kidney disease, stage 3 (moderate): Secondary | ICD-10-CM | POA: Diagnosis not present

## 2015-11-28 DIAGNOSIS — E278 Other specified disorders of adrenal gland: Secondary | ICD-10-CM | POA: Diagnosis not present

## 2015-11-28 DIAGNOSIS — E871 Hypo-osmolality and hyponatremia: Secondary | ICD-10-CM | POA: Diagnosis not present

## 2015-11-28 DIAGNOSIS — M4800 Spinal stenosis, site unspecified: Secondary | ICD-10-CM | POA: Diagnosis not present

## 2015-11-28 DIAGNOSIS — I2510 Atherosclerotic heart disease of native coronary artery without angina pectoris: Secondary | ICD-10-CM | POA: Diagnosis not present

## 2015-11-29 ENCOUNTER — Ambulatory Visit (INDEPENDENT_AMBULATORY_CARE_PROVIDER_SITE_OTHER): Payer: Medicare Other | Admitting: Internal Medicine

## 2015-11-29 ENCOUNTER — Encounter: Payer: Self-pay | Admitting: Internal Medicine

## 2015-11-29 VITALS — BP 104/74 | HR 87 | Temp 98.5°F | Wt 172.0 lb

## 2015-11-29 DIAGNOSIS — I739 Peripheral vascular disease, unspecified: Secondary | ICD-10-CM | POA: Diagnosis not present

## 2015-11-29 DIAGNOSIS — J441 Chronic obstructive pulmonary disease with (acute) exacerbation: Secondary | ICD-10-CM | POA: Diagnosis not present

## 2015-11-29 DIAGNOSIS — I2510 Atherosclerotic heart disease of native coronary artery without angina pectoris: Secondary | ICD-10-CM | POA: Diagnosis not present

## 2015-11-29 DIAGNOSIS — I5043 Acute on chronic combined systolic (congestive) and diastolic (congestive) heart failure: Secondary | ICD-10-CM | POA: Diagnosis not present

## 2015-11-29 DIAGNOSIS — M4800 Spinal stenosis, site unspecified: Secondary | ICD-10-CM | POA: Diagnosis not present

## 2015-11-29 DIAGNOSIS — I130 Hypertensive heart and chronic kidney disease with heart failure and stage 1 through stage 4 chronic kidney disease, or unspecified chronic kidney disease: Secondary | ICD-10-CM | POA: Diagnosis not present

## 2015-11-29 DIAGNOSIS — F39 Unspecified mood [affective] disorder: Secondary | ICD-10-CM | POA: Diagnosis not present

## 2015-11-29 DIAGNOSIS — N183 Chronic kidney disease, stage 3 (moderate): Secondary | ICD-10-CM | POA: Diagnosis not present

## 2015-11-29 DIAGNOSIS — J440 Chronic obstructive pulmonary disease with acute lower respiratory infection: Secondary | ICD-10-CM | POA: Diagnosis not present

## 2015-11-29 DIAGNOSIS — G629 Polyneuropathy, unspecified: Secondary | ICD-10-CM | POA: Diagnosis not present

## 2015-11-29 DIAGNOSIS — J209 Acute bronchitis, unspecified: Secondary | ICD-10-CM | POA: Diagnosis not present

## 2015-11-29 DIAGNOSIS — E278 Other specified disorders of adrenal gland: Secondary | ICD-10-CM | POA: Diagnosis not present

## 2015-11-29 DIAGNOSIS — I5042 Chronic combined systolic (congestive) and diastolic (congestive) heart failure: Secondary | ICD-10-CM

## 2015-11-29 DIAGNOSIS — E871 Hypo-osmolality and hyponatremia: Secondary | ICD-10-CM | POA: Diagnosis not present

## 2015-11-29 LAB — RENAL FUNCTION PANEL
ALBUMIN: 4 g/dL (ref 3.5–5.2)
BUN: 24 mg/dL — AB (ref 6–23)
CALCIUM: 8.6 mg/dL (ref 8.4–10.5)
CO2: 25 mEq/L (ref 19–32)
CREATININE: 1.19 mg/dL (ref 0.40–1.20)
Chloride: 101 mEq/L (ref 96–112)
GFR: 45.85 mL/min — ABNORMAL LOW (ref >60.00)
GLUCOSE: 103 mg/dL — AB (ref 70–99)
Phosphorus: 3.5 mg/dL (ref 2.3–4.6)
Potassium: 4.5 mEq/L (ref 3.5–5.1)
Sodium: 134 mEq/L — ABNORMAL LOW (ref 135–145)

## 2015-11-29 MED ORDER — GABAPENTIN 100 MG PO CAPS: 100.0000 mg | ORAL_CAPSULE | Freq: Two times a day (BID) | ORAL | Status: DC

## 2015-11-29 NOTE — Assessment & Plan Note (Signed)
Much better weight status Breathing is better Will just recheck the renal profile

## 2015-11-29 NOTE — Progress Notes (Signed)
Pre visit review using our clinic review tool, if applicable. No additional management support is needed unless otherwise documented below in the visit note. 

## 2015-11-29 NOTE — Assessment & Plan Note (Signed)
Mood much better back on the escitalopram

## 2015-11-29 NOTE — Progress Notes (Signed)
Subjective:    Patient ID: Haley Harris, female    DOB: 1928/07/19, 82 y.o.   MRN: [id number]  HPI Here for follow up of mood, hyponatremia and CHF With daughter  Head wound has healed well Still has a lump there---and the sensation has not returned  Mood is better again Back on the lexapro  Backed off on the furosemide after down to 170#--per the cardiologist (Dr Terrence Dupont) Breathing is good Occasionally uses the inhaler in this humidity Swelling is much better--usually wears the support hose  Current Outpatient Prescriptions on File Prior to Visit  Medication Sig Dispense Refill  . acetaminophen (TYLENOL) 325 MG tablet Take 2 tablets (650 mg total) by mouth every 4 (four) hours as needed for headache or mild pain.    Marland Kitchen albuterol (PROVENTIL HFA;VENTOLIN HFA) 108 (90 Base) MCG/ACT inhaler Inhale 2 puffs into the lungs every 6 (six) hours as needed for wheezing or shortness of breath. 1 Inhaler 0  . albuterol (PROVENTIL) (2.5 MG/3ML) 0.083% nebulizer solution Take 3 mLs (2.5 mg total) by nebulization every 6 (six) hours as needed for wheezing or shortness of breath. 540 mL 3  . aspirin 81 MG tablet Take 81 mg by mouth daily after lunch.     . Calcium Carbonate-Vitamin D (CALTRATE 600+D PO) Take 1 tablet by mouth daily after lunch.     . carvedilol (COREG) 12.5 MG tablet Take 1 tablet (12.5 mg total) by mouth 2 (two) times daily with a meal. (Patient taking differently: Take 6.25 mg by mouth 2 (two) times daily with a meal. ) 180 tablet 1  . denosumab (PROLIA) 60 MG/ML SOLN injection Inject 60 mg into the skin every 6 (six) months. Reported on 11/05/2015    . escitalopram (LEXAPRO) 10 MG tablet Take 10 mg by mouth daily.    . famotidine (PEPCID) 20 MG tablet Take 1 tablet (20 mg total) by mouth 2 (two) times daily. 180 tablet 1  . furosemide (LASIX) 40 MG tablet Take 1 tablet (40 mg total) by mouth 2 (two) times daily. 60 tablet 0  . gabapentin (NEURONTIN) 100 MG capsule Take 1 capsule  (100 mg total) by mouth 2 (two) times daily. 180 capsule 3  . HYDROcodone-acetaminophen (NORCO/VICODIN) 5-325 MG tablet Take 1 tablet by mouth every 6 (six) hours as needed for moderate pain.    Marland Kitchen levocetirizine (XYZAL) 5 MG tablet Take 5 mg by mouth every evening.    Marland Kitchen losartan (COZAAR) 50 MG tablet TAKE 1 TABLET BY MOUTH DAILY 90 tablet 1  . montelukast (SINGULAIR) 10 MG tablet TAKE 1 TABLET BY MOUTH DAILY 90 tablet 1  . Multiple Vitamin (MULITIVITAMIN WITH MINERALS) TABS Take 1 tablet by mouth daily after lunch.     . pravastatin (PRAVACHOL) 80 MG tablet Take 1 tablet by mouth  daily 90 tablet 3  . spironolactone (ALDACTONE) 25 MG tablet Take 1 tablet by mouth  daily 90 tablet 3  . vitamin B-12 (CYANOCOBALAMIN) 500 MCG tablet Take 1,000 mcg by mouth 3 (three) times a week. Mon, Wed, Fri     No current facility-administered medications on file prior to visit.    Allergies  Allergen Reactions  . Amoxicillin-Pot Clavulanate Anaphylaxis  . Cephalexin Other (See Comments)    Reaction unknown  . Clarithromycin Other (See Comments)    Reaction unknown  . Lidocaine     Patient had tremors following use of lidocaine pain patches  . Nifedipine Other (See Comments)    Reaction unknown  .  Nsaids Other (See Comments)    Heart issue  . Olmesartan Medoxomil     REACTION: cough;  But tolerating Losartan 02/2014  . Penicillins   . Tramadol Hcl Other (See Comments)    Reaction unknown    Past Medical History  Diagnosis Date  . Allergic rhinitis, cause unspecified   . Unspecified cardiovascular disease   . Personal history of malignant neoplasm of breast   . Occlusion and stenosis of carotid artery without mention of cerebral infarction   . Chronic airway obstruction, not elsewhere classified   . Depressive disorder, not elsewhere classified   . Other dyspnea and respiratory abnormality   . Esophageal reflux   . Unspecified hearing loss   . Other and unspecified hyperlipidemia   .  Unspecified essential hypertension   . Osteoporosis, unspecified   . Peripheral vascular disease, unspecified ([place])   . Unspecified urinary incontinence   . Spinal stenosis   . ACE-inhibitor cough   . Angina   . Heart murmur     aS CHILD  . Shortness of breath   . Recurrent upper respiratory infection (URI)   . Anxiety   . Pneumonia   . Neuromuscular disorder ([place])     NEROPATHY FROM STENOSIS  . Myocardial infarct ([place])   . Osteoarthrosis, unspecified whether generalized or localized, unspecified site   . Malignant neoplasm of breast (female), unspecified site   . Compression fracture of T12 vertebra ([place]) 10/28/2011  . CHF (congestive heart failure) [facility])     Past Surgical History  Procedure Laterality Date  . Mastectomy  1987    Right  . Breast reconstruction  1998    Reconstruction   . Reduction mammaplasty  1998    Left  . Mastoidectomy  childhood  . Appendectomy  1948  . Lumbar laminectomy  2003  . Shoulder surgery  02/2005    Bilateral fractures with multiple surgeries  . Breast implant removal  06/12/09    right  . Abdominal hysterectomy    . Fracture surgery      fracture right elbow  . Coronary angioplasty  11/12    distal RCA  . Mastectomy    . Left heart catheterization with coronary angiogram N/A 06/05/2011    Procedure: LEFT HEART CATHETERIZATION WITH CORONARY ANGIOGRAM;  Surgeon: Clent Demark, MD;  Location: [facility] CATH LAB;  Service: Cardiovascular;  Laterality: N/A;  . Left heart catheterization with coronary angiogram N/A 03/05/2014    Procedure: LEFT HEART CATHETERIZATION WITH CORONARY ANGIOGRAM;  Surgeon: Clent Demark, MD;  Location: [facility] CATH LAB;  Service: Cardiovascular;  Laterality: N/A;  . Flexible sigmoidoscopy N/A 01/21/2015    Procedure: FLEXIBLE SIGMOIDOSCOPY;  Surgeon: Richmond Campbell, MD;  Location: [facility] ENDOSCOPY;  Service: Endoscopy;  Laterality: N/A;  . Flexible sigmoidoscopy  01/22/2015       . Cardiac catheterization N/A 01/25/2015     Procedure: Left Heart Cath and Coronary Angiography;  Surgeon: Charolette Forward, MD;  Location: [place] CV LAB;  Service: Cardiovascular;  Laterality: N/A;    Family History  Problem Relation Age of Onset  . Heart attack Father 57  . Cancer Mother     ovarian  . Cancer Brother     lung  . Arthritis      family    Social History   Social History  . Marital Status: Widowed    Spouse Name: N/A  . Number of Children: 2  . Years of Education: N/A   Occupational History  .  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     Social History Main Topics  . Smoking status: Former Smoker    Quit date: 07/13/1974  . Smokeless tobacco: Never Used  . Alcohol Use: 0.0 oz/week    0 Standard drinks or equivalent per week     Comment: occasional  . Drug Use: No  . Sexual Activity: No   Other Topics Concern  . Not on file   Social History Narrative   Has living will   Son or daughter would be health care POA.   Requests DNR--written 03/02/13   No tube feeds if cognitively unaware   Review of Systems Some sleep problems--thinks it is related to her pain (more hip than knee) Does have pain in left groin --so wonders about hernia Less pain meds--- oxycodone changed to hydrocodone Appetite is good Weight down a bit    Objective:   Physical Exam  Constitutional: She appears well-developed. No distress.  Cardiovascular: Normal rate, regular rhythm and normal heart sounds.  Exam reveals no gallop.   No murmur heard. Pulmonary/Chest: Effort normal and breath sounds normal. No respiratory distress. She has no wheezes. She has no rales.  Abdominal:  No apparent inguinal or femoral hernia on left  Musculoskeletal:  Has pain in groin and hip with passive ROM (right) At most trace calf edema!  Psychiatric: She has a normal mood and affect. Her behavior is normal.          Assessment & Plan:

## 2015-12-02 DIAGNOSIS — N183 Chronic kidney disease, stage 3 (moderate): Secondary | ICD-10-CM | POA: Diagnosis not present

## 2015-12-02 DIAGNOSIS — E871 Hypo-osmolality and hyponatremia: Secondary | ICD-10-CM | POA: Diagnosis not present

## 2015-12-02 DIAGNOSIS — J441 Chronic obstructive pulmonary disease with (acute) exacerbation: Secondary | ICD-10-CM | POA: Diagnosis not present

## 2015-12-02 DIAGNOSIS — E278 Other specified disorders of adrenal gland: Secondary | ICD-10-CM | POA: Diagnosis not present

## 2015-12-02 DIAGNOSIS — I5043 Acute on chronic combined systolic (congestive) and diastolic (congestive) heart failure: Secondary | ICD-10-CM | POA: Diagnosis not present

## 2015-12-02 DIAGNOSIS — M4800 Spinal stenosis, site unspecified: Secondary | ICD-10-CM | POA: Diagnosis not present

## 2015-12-02 DIAGNOSIS — I130 Hypertensive heart and chronic kidney disease with heart failure and stage 1 through stage 4 chronic kidney disease, or unspecified chronic kidney disease: Secondary | ICD-10-CM | POA: Diagnosis not present

## 2015-12-02 DIAGNOSIS — I2510 Atherosclerotic heart disease of native coronary artery without angina pectoris: Secondary | ICD-10-CM | POA: Diagnosis not present

## 2015-12-02 DIAGNOSIS — J440 Chronic obstructive pulmonary disease with acute lower respiratory infection: Secondary | ICD-10-CM | POA: Diagnosis not present

## 2015-12-02 DIAGNOSIS — G629 Polyneuropathy, unspecified: Secondary | ICD-10-CM | POA: Diagnosis not present

## 2015-12-02 DIAGNOSIS — I739 Peripheral vascular disease, unspecified: Secondary | ICD-10-CM | POA: Diagnosis not present

## 2015-12-02 DIAGNOSIS — J209 Acute bronchitis, unspecified: Secondary | ICD-10-CM | POA: Diagnosis not present

## 2015-12-03 DIAGNOSIS — I739 Peripheral vascular disease, unspecified: Secondary | ICD-10-CM | POA: Diagnosis not present

## 2015-12-03 DIAGNOSIS — I2510 Atherosclerotic heart disease of native coronary artery without angina pectoris: Secondary | ICD-10-CM | POA: Diagnosis not present

## 2015-12-03 DIAGNOSIS — G629 Polyneuropathy, unspecified: Secondary | ICD-10-CM | POA: Diagnosis not present

## 2015-12-03 DIAGNOSIS — E871 Hypo-osmolality and hyponatremia: Secondary | ICD-10-CM | POA: Diagnosis not present

## 2015-12-03 DIAGNOSIS — J209 Acute bronchitis, unspecified: Secondary | ICD-10-CM | POA: Diagnosis not present

## 2015-12-03 DIAGNOSIS — J440 Chronic obstructive pulmonary disease with acute lower respiratory infection: Secondary | ICD-10-CM | POA: Diagnosis not present

## 2015-12-03 DIAGNOSIS — N183 Chronic kidney disease, stage 3 (moderate): Secondary | ICD-10-CM | POA: Diagnosis not present

## 2015-12-03 DIAGNOSIS — I130 Hypertensive heart and chronic kidney disease with heart failure and stage 1 through stage 4 chronic kidney disease, or unspecified chronic kidney disease: Secondary | ICD-10-CM | POA: Diagnosis not present

## 2015-12-03 DIAGNOSIS — J441 Chronic obstructive pulmonary disease with (acute) exacerbation: Secondary | ICD-10-CM | POA: Diagnosis not present

## 2015-12-03 DIAGNOSIS — I5043 Acute on chronic combined systolic (congestive) and diastolic (congestive) heart failure: Secondary | ICD-10-CM | POA: Diagnosis not present

## 2015-12-03 DIAGNOSIS — M4800 Spinal stenosis, site unspecified: Secondary | ICD-10-CM | POA: Diagnosis not present

## 2015-12-03 DIAGNOSIS — E278 Other specified disorders of adrenal gland: Secondary | ICD-10-CM | POA: Diagnosis not present

## 2015-12-04 DIAGNOSIS — I130 Hypertensive heart and chronic kidney disease with heart failure and stage 1 through stage 4 chronic kidney disease, or unspecified chronic kidney disease: Secondary | ICD-10-CM | POA: Diagnosis not present

## 2015-12-04 DIAGNOSIS — E278 Other specified disorders of adrenal gland: Secondary | ICD-10-CM | POA: Diagnosis not present

## 2015-12-04 DIAGNOSIS — N183 Chronic kidney disease, stage 3 (moderate): Secondary | ICD-10-CM | POA: Diagnosis not present

## 2015-12-04 DIAGNOSIS — J440 Chronic obstructive pulmonary disease with acute lower respiratory infection: Secondary | ICD-10-CM | POA: Diagnosis not present

## 2015-12-04 DIAGNOSIS — E871 Hypo-osmolality and hyponatremia: Secondary | ICD-10-CM | POA: Diagnosis not present

## 2015-12-04 DIAGNOSIS — I2510 Atherosclerotic heart disease of native coronary artery without angina pectoris: Secondary | ICD-10-CM | POA: Diagnosis not present

## 2015-12-04 DIAGNOSIS — M4800 Spinal stenosis, site unspecified: Secondary | ICD-10-CM | POA: Diagnosis not present

## 2015-12-04 DIAGNOSIS — I739 Peripheral vascular disease, unspecified: Secondary | ICD-10-CM | POA: Diagnosis not present

## 2015-12-04 DIAGNOSIS — G629 Polyneuropathy, unspecified: Secondary | ICD-10-CM | POA: Diagnosis not present

## 2015-12-04 DIAGNOSIS — J441 Chronic obstructive pulmonary disease with (acute) exacerbation: Secondary | ICD-10-CM | POA: Diagnosis not present

## 2015-12-04 DIAGNOSIS — J209 Acute bronchitis, unspecified: Secondary | ICD-10-CM | POA: Diagnosis not present

## 2015-12-04 DIAGNOSIS — I5043 Acute on chronic combined systolic (congestive) and diastolic (congestive) heart failure: Secondary | ICD-10-CM | POA: Diagnosis not present

## 2015-12-05 ENCOUNTER — Ambulatory Visit: Payer: Self-pay | Admitting: *Deleted

## 2015-12-05 ENCOUNTER — Other Ambulatory Visit: Payer: Self-pay | Admitting: *Deleted

## 2015-12-05 DIAGNOSIS — I739 Peripheral vascular disease, unspecified: Secondary | ICD-10-CM | POA: Diagnosis not present

## 2015-12-05 DIAGNOSIS — J441 Chronic obstructive pulmonary disease with (acute) exacerbation: Secondary | ICD-10-CM | POA: Diagnosis not present

## 2015-12-05 DIAGNOSIS — M4800 Spinal stenosis, site unspecified: Secondary | ICD-10-CM | POA: Diagnosis not present

## 2015-12-05 DIAGNOSIS — J209 Acute bronchitis, unspecified: Secondary | ICD-10-CM | POA: Diagnosis not present

## 2015-12-05 DIAGNOSIS — I130 Hypertensive heart and chronic kidney disease with heart failure and stage 1 through stage 4 chronic kidney disease, or unspecified chronic kidney disease: Secondary | ICD-10-CM | POA: Diagnosis not present

## 2015-12-05 DIAGNOSIS — E871 Hypo-osmolality and hyponatremia: Secondary | ICD-10-CM | POA: Diagnosis not present

## 2015-12-05 DIAGNOSIS — G629 Polyneuropathy, unspecified: Secondary | ICD-10-CM | POA: Diagnosis not present

## 2015-12-05 DIAGNOSIS — N183 Chronic kidney disease, stage 3 (moderate): Secondary | ICD-10-CM | POA: Diagnosis not present

## 2015-12-05 DIAGNOSIS — I5043 Acute on chronic combined systolic (congestive) and diastolic (congestive) heart failure: Secondary | ICD-10-CM | POA: Diagnosis not present

## 2015-12-05 DIAGNOSIS — J440 Chronic obstructive pulmonary disease with acute lower respiratory infection: Secondary | ICD-10-CM | POA: Diagnosis not present

## 2015-12-05 DIAGNOSIS — I2510 Atherosclerotic heart disease of native coronary artery without angina pectoris: Secondary | ICD-10-CM | POA: Diagnosis not present

## 2015-12-05 DIAGNOSIS — E278 Other specified disorders of adrenal gland: Secondary | ICD-10-CM | POA: Diagnosis not present

## 2015-12-05 NOTE — Patient Outreach (Signed)
[facility]) Care Management [place] Telephone Outreach, Transition of Care Day 31 12/05/2015  MATTIGAN HARTSON [birth year] [id number]  Successful telephone outreach to Ms. Haley Harris, 82 y/o female referred to [facility] for transition of care after two recent IP hospital visits, March 17-29, 2017 for ARF with hypoxia related to flu and CHF and again on April 18-22, 2017, for hyponatremia following an acute fall at home where she incurred 50 stitches to a forehead laceration.  Ms. Haley Harris reports that she is doing well this week, but having "my regular aches with all this bad weather and rain, but no real pain."  Ms. Haley Harris stated that she visits her PCP last week on May 19, and "got a good report-- my sodium level is staying stable and is almost up to normal-- it was 134.  Dr. Silvio Pate was very pleased, and so am I."  Ms. Haley Harris reports that her daughter Haley Harris has " a GI bug," and states she hopes she does not catch it.  Ms. Haley Harris states that she continues monitoring and recording her weights daily, stating, "they are right where they need to be."  Ms. Haley Harris said that she has not experienced significant ankle/feet/leg edema.  She states that she has continued walking regularly and working with [facility] PT, and believes she is making good progress in slowly increasing her strength and mobility.  Ms. Haley Harris reports that her [facility] services through [place] will be extended so she can continue getting regular structured activity.  Ms. Haley Harris reports that she has not experienced any more falls, is using her walker 100% of the time, continuing to take her medications as prescribed, and has attended all scheduled provider appointments.  She denies concerns, questions, needs, or problems this morning, and states that no changes have occurred since our last telephone outreach.  We discussed that today marks day 31 since she was discharged home from the hospital, and that I would no longer be making  weekly calls to her, but would continue following up with her monthly.  [name] agreed that she would contact [facility] CM for any issues or problems that arise in the future.  I confirmed that Ms. Haley Harris had my direct contact information, and we also confirmed our next scheduled in-home visit, planned for next month.  Plan: Ms. Haley Harris will continue actively participating in [facility] PT, and work with [facility] RN. Ms. Haley Harris will continue monitoring and recording her daily weights, blood pressure, and pulse oximetry readings. Ms. Haley Harris will continue taking her medications as prescribed. Ms. Haley Harris will continue attending all provider appointments as scheduled. Ms. Haley Harris will continue monitoring her fluid intake , wearing her compression hose, and elevating her legs when at rest. Ms. Haley Harris will contact her medical providers/ [facility] CM team for any questions or problems that arise. [place] home visit planned next month.  Oneta Rack, RN, BSN, [facility] Care Management  [phone]

## 2015-12-10 ENCOUNTER — Ambulatory Visit (INDEPENDENT_AMBULATORY_CARE_PROVIDER_SITE_OTHER): Payer: Medicare Other | Admitting: Sports Medicine

## 2015-12-10 ENCOUNTER — Telehealth: Payer: Self-pay

## 2015-12-10 ENCOUNTER — Encounter: Payer: Self-pay | Admitting: Sports Medicine

## 2015-12-10 DIAGNOSIS — N183 Chronic kidney disease, stage 3 (moderate): Secondary | ICD-10-CM | POA: Diagnosis not present

## 2015-12-10 DIAGNOSIS — J440 Chronic obstructive pulmonary disease with acute lower respiratory infection: Secondary | ICD-10-CM | POA: Diagnosis not present

## 2015-12-10 DIAGNOSIS — M4800 Spinal stenosis, site unspecified: Secondary | ICD-10-CM | POA: Diagnosis not present

## 2015-12-10 DIAGNOSIS — M79676 Pain in unspecified toe(s): Secondary | ICD-10-CM | POA: Diagnosis not present

## 2015-12-10 DIAGNOSIS — J441 Chronic obstructive pulmonary disease with (acute) exacerbation: Secondary | ICD-10-CM | POA: Diagnosis not present

## 2015-12-10 DIAGNOSIS — I130 Hypertensive heart and chronic kidney disease with heart failure and stage 1 through stage 4 chronic kidney disease, or unspecified chronic kidney disease: Secondary | ICD-10-CM | POA: Diagnosis not present

## 2015-12-10 DIAGNOSIS — G629 Polyneuropathy, unspecified: Secondary | ICD-10-CM | POA: Diagnosis not present

## 2015-12-10 DIAGNOSIS — I5043 Acute on chronic combined systolic (congestive) and diastolic (congestive) heart failure: Secondary | ICD-10-CM | POA: Diagnosis not present

## 2015-12-10 DIAGNOSIS — I2510 Atherosclerotic heart disease of native coronary artery without angina pectoris: Secondary | ICD-10-CM | POA: Diagnosis not present

## 2015-12-10 DIAGNOSIS — E278 Other specified disorders of adrenal gland: Secondary | ICD-10-CM | POA: Diagnosis not present

## 2015-12-10 DIAGNOSIS — E871 Hypo-osmolality and hyponatremia: Secondary | ICD-10-CM | POA: Diagnosis not present

## 2015-12-10 DIAGNOSIS — J209 Acute bronchitis, unspecified: Secondary | ICD-10-CM | POA: Diagnosis not present

## 2015-12-10 DIAGNOSIS — I739 Peripheral vascular disease, unspecified: Secondary | ICD-10-CM | POA: Diagnosis not present

## 2015-12-10 DIAGNOSIS — B351 Tinea unguium: Secondary | ICD-10-CM

## 2015-12-10 NOTE — Progress Notes (Signed)
Patient ID: Haley Harris, female   DOB: 09/23/1928, 82 y.o.   MRN: [id number] Subjective: Haley Harris is a 82 y.o. female patient seen today in office with complaint of painful thickened and elongated toenails; unable to trim. Patient denies history of Diabetes or Neuropathy. Has history of PVD. Patient has no other pedal complaints at this time.   Patient Active Problem List   Diagnosis Date Noted  . Venous stasis dermatitis 11/13/2015  . Essential hypertension 10/30/2015  . COPD with acute bronchitis ([place]) 09/27/2015  . Acute respiratory failure with hypoxia ([place]) 09/27/2015  . Diverticulitis of colon 02/18/2015  . Acute blood loss anemia   . Hyponatremia   . CAD in native artery   . Chronic combined systolic and diastolic CHF (congestive heart failure) ([place])   . Chronic kidney disease, stage III (moderate)   . Other emphysema ([place])   . PVD (peripheral vascular disease) ([place])   . Acute renal failure superimposed on stage 3 chronic kidney disease ([place]) 01/18/2015  . Right adrenal mass ([place]) 01/18/2015  . Chronic venous insufficiency 12/18/2014  . Advanced directives, counseling/discussion 04/02/2014  . Neuropathy due to peripheral vascular disease ([place]) 03/11/2014  . NSTEMI (non-ST elevated myocardial infarction) ([place]) 03/06/2014  . Chronic combined systolic and diastolic CHF, NYHA class 1 ([place]) 03/06/2014  . Routine general medical examination at a health care facility 03/02/2013  . Constipation due to pain medication 10/28/2011  . CAD (coronary artery disease), native coronary artery 06/05/2011  . Carotid stenosis 07/19/2009  . PERIPHERAL VASCULAR DISEASE 04/07/2007  . Dyslipidemia 10/30/2006  . Episodic mood disorder ([place]) 10/30/2006  . COPD (chronic obstructive pulmonary disease) ([place]) 10/30/2006  . GERD 10/30/2006  . Osteoarthritis 10/30/2006  . Osteoporosis 10/30/2006  . URINARY INCONTINENCE 10/30/2006    Current Outpatient Prescriptions on File Prior to Visit   Medication Sig Dispense Refill  . acetaminophen (TYLENOL) 325 MG tablet Take 2 tablets (650 mg total) by mouth every 4 (four) hours as needed for headache or mild pain.    Marland Kitchen albuterol (PROVENTIL HFA;VENTOLIN HFA) 108 (90 Base) MCG/ACT inhaler Inhale 2 puffs into the lungs every 6 (six) hours as needed for wheezing or shortness of breath. 1 Inhaler 0  . albuterol (PROVENTIL) (2.5 MG/3ML) 0.083% nebulizer solution Take 3 mLs (2.5 mg total) by nebulization every 6 (six) hours as needed for wheezing or shortness of breath. 540 mL 3  . aspirin 81 MG tablet Take 81 mg by mouth daily after lunch.     . Calcium Carbonate-Vitamin D (CALTRATE 600+D PO) Take 1 tablet by mouth daily after lunch.     . carvedilol (COREG) 12.5 MG tablet Take 1 tablet (12.5 mg total) by mouth 2 (two) times daily with a meal. (Patient taking differently: Take 6.25 mg by mouth 2 (two) times daily with a meal. ) 180 tablet 1  . denosumab (PROLIA) 60 MG/ML SOLN injection Inject 60 mg into the skin every 6 (six) months. Reported on 11/05/2015    . escitalopram (LEXAPRO) 10 MG tablet Take 10 mg by mouth daily.    . famotidine (PEPCID) 20 MG tablet Take 1 tablet (20 mg total) by mouth 2 (two) times daily. 180 tablet 1  . furosemide (LASIX) 40 MG tablet Take 1 tablet (40 mg total) by mouth 2 (two) times daily. 60 tablet 0  . gabapentin (NEURONTIN) 100 MG capsule Take 1 capsule (100 mg total) by mouth 2 (two) times daily. 180 capsule 3  . HYDROcodone-acetaminophen (NORCO/VICODIN) 5-325 MG tablet  Take 1 tablet by mouth every 6 (six) hours as needed for moderate pain.    Marland Kitchen levocetirizine (XYZAL) 5 MG tablet Take 5 mg by mouth every evening.    Marland Kitchen losartan (COZAAR) 50 MG tablet TAKE 1 TABLET BY MOUTH DAILY 90 tablet 1  . montelukast (SINGULAIR) 10 MG tablet TAKE 1 TABLET BY MOUTH DAILY 90 tablet 1  . Multiple Vitamin (MULITIVITAMIN WITH MINERALS) TABS Take 1 tablet by mouth daily after lunch.     . pravastatin (PRAVACHOL) 80 MG tablet Take 1  tablet by mouth  daily 90 tablet 3  . spironolactone (ALDACTONE) 25 MG tablet Take 1 tablet by mouth  daily 90 tablet 3  . vitamin B-12 (CYANOCOBALAMIN) 500 MCG tablet Take 1,000 mcg by mouth 3 (three) times a week. Mon, Wed, Fri     No current facility-administered medications on file prior to visit.    Allergies  Allergen Reactions  . Amoxicillin-Pot Clavulanate Anaphylaxis  . Cephalexin Other (See Comments)    Reaction unknown  . Clarithromycin Other (See Comments)    Reaction unknown  . Lidocaine     Patient had tremors following use of lidocaine pain patches  . Nifedipine Other (See Comments)    Reaction unknown  . Nsaids Other (See Comments)    Heart issue  . Olmesartan Medoxomil     REACTION: cough;  But tolerating Losartan 02/2014  . Penicillins   . Tramadol Hcl Other (See Comments)    Reaction unknown    Objective: Physical Exam  General: Well developed, nourished, no acute distress, awake, alert and oriented x 3  Vascular: Dorsalis pedis artery 2/4 bilateral, Posterior tibial artery 1/4 bilateral, skin temperature warm to warm proximal to distal bilateral lower extremities, + varicosities, decreased pedal hair present bilateral.  Neurological: Gross sensation present via light touch bilateral.   Dermatological: Skin is warm, dry, and supple bilateral, Nails 1-10 are tender, long, thick, and discolored with mild subungal debris, no webspace macerations present bilateral, no open lesions present bilateral, no callus/corns/hyperkeratotic tissue present bilateral. No signs of infection bilateral.  Musculoskeletal: Asymptomatic bunion and Pes planus bilateral. Muscular strength within normal limits without painon range of motion. No pain with calf compression bilateral.  Assessment and Plan:  Problem List Items Addressed This Visit    None    Visit Diagnoses    Pain due to onychomycosis of toenail    -  Primary       -Examined patient.  -Discussed treatment  options for painful mycotic nails. -Mechanically debrided and reduced mycotic nails with sterile nail nipper and dremel nail file without incident. -Patient to return in 3 months for follow up evaluation or sooner if symptoms worsen.  Landis Martins, DPM

## 2015-12-10 NOTE — Telephone Encounter (Signed)
Okay to continue?

## 2015-12-10 NOTE — Telephone Encounter (Signed)
Junie Panning PT with [facility] left v/m requesting verbal orders to continue home health PT 2 x a week for 4 weeks for gait training,cardio pulmonary mgt and balance.Please advise.

## 2015-12-10 NOTE — Telephone Encounter (Signed)
Left detailed message on Erin's voice mail

## 2015-12-12 DIAGNOSIS — I2510 Atherosclerotic heart disease of native coronary artery without angina pectoris: Secondary | ICD-10-CM | POA: Diagnosis not present

## 2015-12-12 DIAGNOSIS — M4800 Spinal stenosis, site unspecified: Secondary | ICD-10-CM | POA: Diagnosis not present

## 2015-12-12 DIAGNOSIS — G629 Polyneuropathy, unspecified: Secondary | ICD-10-CM | POA: Diagnosis not present

## 2015-12-12 DIAGNOSIS — J441 Chronic obstructive pulmonary disease with (acute) exacerbation: Secondary | ICD-10-CM | POA: Diagnosis not present

## 2015-12-12 DIAGNOSIS — I739 Peripheral vascular disease, unspecified: Secondary | ICD-10-CM | POA: Diagnosis not present

## 2015-12-12 DIAGNOSIS — J209 Acute bronchitis, unspecified: Secondary | ICD-10-CM | POA: Diagnosis not present

## 2015-12-12 DIAGNOSIS — E871 Hypo-osmolality and hyponatremia: Secondary | ICD-10-CM | POA: Diagnosis not present

## 2015-12-12 DIAGNOSIS — N183 Chronic kidney disease, stage 3 (moderate): Secondary | ICD-10-CM | POA: Diagnosis not present

## 2015-12-12 DIAGNOSIS — I130 Hypertensive heart and chronic kidney disease with heart failure and stage 1 through stage 4 chronic kidney disease, or unspecified chronic kidney disease: Secondary | ICD-10-CM | POA: Diagnosis not present

## 2015-12-12 DIAGNOSIS — J440 Chronic obstructive pulmonary disease with acute lower respiratory infection: Secondary | ICD-10-CM | POA: Diagnosis not present

## 2015-12-12 DIAGNOSIS — E278 Other specified disorders of adrenal gland: Secondary | ICD-10-CM | POA: Diagnosis not present

## 2015-12-12 DIAGNOSIS — I5043 Acute on chronic combined systolic (congestive) and diastolic (congestive) heart failure: Secondary | ICD-10-CM | POA: Diagnosis not present

## 2015-12-16 DIAGNOSIS — J449 Chronic obstructive pulmonary disease, unspecified: Secondary | ICD-10-CM | POA: Diagnosis not present

## 2015-12-16 DIAGNOSIS — M1991 Primary osteoarthritis, unspecified site: Secondary | ICD-10-CM | POA: Diagnosis not present

## 2015-12-16 DIAGNOSIS — Z7902 Long term (current) use of antithrombotics/antiplatelets: Secondary | ICD-10-CM | POA: Diagnosis not present

## 2015-12-16 DIAGNOSIS — I5042 Chronic combined systolic (congestive) and diastolic (congestive) heart failure: Secondary | ICD-10-CM | POA: Diagnosis not present

## 2015-12-16 DIAGNOSIS — M4800 Spinal stenosis, site unspecified: Secondary | ICD-10-CM | POA: Diagnosis not present

## 2015-12-16 DIAGNOSIS — I2510 Atherosclerotic heart disease of native coronary artery without angina pectoris: Secondary | ICD-10-CM | POA: Diagnosis not present

## 2015-12-16 DIAGNOSIS — I872 Venous insufficiency (chronic) (peripheral): Secondary | ICD-10-CM | POA: Diagnosis not present

## 2015-12-16 DIAGNOSIS — R262 Difficulty in walking, not elsewhere classified: Secondary | ICD-10-CM | POA: Diagnosis not present

## 2015-12-16 DIAGNOSIS — G629 Polyneuropathy, unspecified: Secondary | ICD-10-CM | POA: Diagnosis not present

## 2015-12-16 DIAGNOSIS — Z792 Long term (current) use of antibiotics: Secondary | ICD-10-CM | POA: Diagnosis not present

## 2015-12-16 DIAGNOSIS — I739 Peripheral vascular disease, unspecified: Secondary | ICD-10-CM | POA: Diagnosis not present

## 2015-12-16 DIAGNOSIS — Z7952 Long term (current) use of systemic steroids: Secondary | ICD-10-CM | POA: Diagnosis not present

## 2015-12-16 DIAGNOSIS — I130 Hypertensive heart and chronic kidney disease with heart failure and stage 1 through stage 4 chronic kidney disease, or unspecified chronic kidney disease: Secondary | ICD-10-CM | POA: Diagnosis not present

## 2015-12-17 DIAGNOSIS — Z792 Long term (current) use of antibiotics: Secondary | ICD-10-CM | POA: Diagnosis not present

## 2015-12-17 DIAGNOSIS — I739 Peripheral vascular disease, unspecified: Secondary | ICD-10-CM | POA: Diagnosis not present

## 2015-12-17 DIAGNOSIS — M4800 Spinal stenosis, site unspecified: Secondary | ICD-10-CM | POA: Diagnosis not present

## 2015-12-17 DIAGNOSIS — Z7952 Long term (current) use of systemic steroids: Secondary | ICD-10-CM | POA: Diagnosis not present

## 2015-12-17 DIAGNOSIS — I2510 Atherosclerotic heart disease of native coronary artery without angina pectoris: Secondary | ICD-10-CM | POA: Diagnosis not present

## 2015-12-17 DIAGNOSIS — G629 Polyneuropathy, unspecified: Secondary | ICD-10-CM | POA: Diagnosis not present

## 2015-12-17 DIAGNOSIS — I130 Hypertensive heart and chronic kidney disease with heart failure and stage 1 through stage 4 chronic kidney disease, or unspecified chronic kidney disease: Secondary | ICD-10-CM | POA: Diagnosis not present

## 2015-12-17 DIAGNOSIS — R262 Difficulty in walking, not elsewhere classified: Secondary | ICD-10-CM | POA: Diagnosis not present

## 2015-12-17 DIAGNOSIS — I5042 Chronic combined systolic (congestive) and diastolic (congestive) heart failure: Secondary | ICD-10-CM | POA: Diagnosis not present

## 2015-12-17 DIAGNOSIS — I872 Venous insufficiency (chronic) (peripheral): Secondary | ICD-10-CM | POA: Diagnosis not present

## 2015-12-17 DIAGNOSIS — J449 Chronic obstructive pulmonary disease, unspecified: Secondary | ICD-10-CM | POA: Diagnosis not present

## 2015-12-17 DIAGNOSIS — Z7902 Long term (current) use of antithrombotics/antiplatelets: Secondary | ICD-10-CM | POA: Diagnosis not present

## 2015-12-17 DIAGNOSIS — M1991 Primary osteoarthritis, unspecified site: Secondary | ICD-10-CM | POA: Diagnosis not present

## 2015-12-18 DIAGNOSIS — M1711 Unilateral primary osteoarthritis, right knee: Secondary | ICD-10-CM | POA: Diagnosis not present

## 2015-12-19 DIAGNOSIS — J449 Chronic obstructive pulmonary disease, unspecified: Secondary | ICD-10-CM | POA: Diagnosis not present

## 2015-12-19 DIAGNOSIS — I2510 Atherosclerotic heart disease of native coronary artery without angina pectoris: Secondary | ICD-10-CM | POA: Diagnosis not present

## 2015-12-19 DIAGNOSIS — Z7952 Long term (current) use of systemic steroids: Secondary | ICD-10-CM | POA: Diagnosis not present

## 2015-12-19 DIAGNOSIS — R262 Difficulty in walking, not elsewhere classified: Secondary | ICD-10-CM | POA: Diagnosis not present

## 2015-12-19 DIAGNOSIS — Z792 Long term (current) use of antibiotics: Secondary | ICD-10-CM | POA: Diagnosis not present

## 2015-12-19 DIAGNOSIS — Z7902 Long term (current) use of antithrombotics/antiplatelets: Secondary | ICD-10-CM | POA: Diagnosis not present

## 2015-12-19 DIAGNOSIS — I5042 Chronic combined systolic (congestive) and diastolic (congestive) heart failure: Secondary | ICD-10-CM | POA: Diagnosis not present

## 2015-12-19 DIAGNOSIS — I872 Venous insufficiency (chronic) (peripheral): Secondary | ICD-10-CM | POA: Diagnosis not present

## 2015-12-19 DIAGNOSIS — G629 Polyneuropathy, unspecified: Secondary | ICD-10-CM | POA: Diagnosis not present

## 2015-12-19 DIAGNOSIS — I739 Peripheral vascular disease, unspecified: Secondary | ICD-10-CM | POA: Diagnosis not present

## 2015-12-19 DIAGNOSIS — M1991 Primary osteoarthritis, unspecified site: Secondary | ICD-10-CM | POA: Diagnosis not present

## 2015-12-19 DIAGNOSIS — I130 Hypertensive heart and chronic kidney disease with heart failure and stage 1 through stage 4 chronic kidney disease, or unspecified chronic kidney disease: Secondary | ICD-10-CM | POA: Diagnosis not present

## 2015-12-19 DIAGNOSIS — M4800 Spinal stenosis, site unspecified: Secondary | ICD-10-CM | POA: Diagnosis not present

## 2015-12-23 DIAGNOSIS — I872 Venous insufficiency (chronic) (peripheral): Secondary | ICD-10-CM | POA: Diagnosis not present

## 2015-12-23 DIAGNOSIS — M4800 Spinal stenosis, site unspecified: Secondary | ICD-10-CM | POA: Diagnosis not present

## 2015-12-23 DIAGNOSIS — Z792 Long term (current) use of antibiotics: Secondary | ICD-10-CM | POA: Diagnosis not present

## 2015-12-23 DIAGNOSIS — Z7952 Long term (current) use of systemic steroids: Secondary | ICD-10-CM | POA: Diagnosis not present

## 2015-12-23 DIAGNOSIS — I2510 Atherosclerotic heart disease of native coronary artery without angina pectoris: Secondary | ICD-10-CM | POA: Diagnosis not present

## 2015-12-23 DIAGNOSIS — I5042 Chronic combined systolic (congestive) and diastolic (congestive) heart failure: Secondary | ICD-10-CM | POA: Diagnosis not present

## 2015-12-23 DIAGNOSIS — I130 Hypertensive heart and chronic kidney disease with heart failure and stage 1 through stage 4 chronic kidney disease, or unspecified chronic kidney disease: Secondary | ICD-10-CM | POA: Diagnosis not present

## 2015-12-23 DIAGNOSIS — J449 Chronic obstructive pulmonary disease, unspecified: Secondary | ICD-10-CM | POA: Diagnosis not present

## 2015-12-23 DIAGNOSIS — R262 Difficulty in walking, not elsewhere classified: Secondary | ICD-10-CM | POA: Diagnosis not present

## 2015-12-23 DIAGNOSIS — M1991 Primary osteoarthritis, unspecified site: Secondary | ICD-10-CM | POA: Diagnosis not present

## 2015-12-23 DIAGNOSIS — G629 Polyneuropathy, unspecified: Secondary | ICD-10-CM | POA: Diagnosis not present

## 2015-12-23 DIAGNOSIS — I739 Peripheral vascular disease, unspecified: Secondary | ICD-10-CM | POA: Diagnosis not present

## 2015-12-23 DIAGNOSIS — Z7902 Long term (current) use of antithrombotics/antiplatelets: Secondary | ICD-10-CM | POA: Diagnosis not present

## 2015-12-24 DIAGNOSIS — Z7902 Long term (current) use of antithrombotics/antiplatelets: Secondary | ICD-10-CM | POA: Diagnosis not present

## 2015-12-24 DIAGNOSIS — I130 Hypertensive heart and chronic kidney disease with heart failure and stage 1 through stage 4 chronic kidney disease, or unspecified chronic kidney disease: Secondary | ICD-10-CM | POA: Diagnosis not present

## 2015-12-24 DIAGNOSIS — M1991 Primary osteoarthritis, unspecified site: Secondary | ICD-10-CM | POA: Diagnosis not present

## 2015-12-24 DIAGNOSIS — Z792 Long term (current) use of antibiotics: Secondary | ICD-10-CM | POA: Diagnosis not present

## 2015-12-24 DIAGNOSIS — J449 Chronic obstructive pulmonary disease, unspecified: Secondary | ICD-10-CM | POA: Diagnosis not present

## 2015-12-24 DIAGNOSIS — R262 Difficulty in walking, not elsewhere classified: Secondary | ICD-10-CM | POA: Diagnosis not present

## 2015-12-24 DIAGNOSIS — Z7952 Long term (current) use of systemic steroids: Secondary | ICD-10-CM | POA: Diagnosis not present

## 2015-12-24 DIAGNOSIS — G629 Polyneuropathy, unspecified: Secondary | ICD-10-CM | POA: Diagnosis not present

## 2015-12-24 DIAGNOSIS — I739 Peripheral vascular disease, unspecified: Secondary | ICD-10-CM | POA: Diagnosis not present

## 2015-12-24 DIAGNOSIS — I5042 Chronic combined systolic (congestive) and diastolic (congestive) heart failure: Secondary | ICD-10-CM | POA: Diagnosis not present

## 2015-12-24 DIAGNOSIS — I2510 Atherosclerotic heart disease of native coronary artery without angina pectoris: Secondary | ICD-10-CM | POA: Diagnosis not present

## 2015-12-24 DIAGNOSIS — M4800 Spinal stenosis, site unspecified: Secondary | ICD-10-CM | POA: Diagnosis not present

## 2015-12-24 DIAGNOSIS — I872 Venous insufficiency (chronic) (peripheral): Secondary | ICD-10-CM | POA: Diagnosis not present

## 2015-12-26 DIAGNOSIS — M4800 Spinal stenosis, site unspecified: Secondary | ICD-10-CM | POA: Diagnosis not present

## 2015-12-26 DIAGNOSIS — J449 Chronic obstructive pulmonary disease, unspecified: Secondary | ICD-10-CM | POA: Diagnosis not present

## 2015-12-26 DIAGNOSIS — I2510 Atherosclerotic heart disease of native coronary artery without angina pectoris: Secondary | ICD-10-CM | POA: Diagnosis not present

## 2015-12-26 DIAGNOSIS — Z792 Long term (current) use of antibiotics: Secondary | ICD-10-CM | POA: Diagnosis not present

## 2015-12-26 DIAGNOSIS — M1991 Primary osteoarthritis, unspecified site: Secondary | ICD-10-CM | POA: Diagnosis not present

## 2015-12-26 DIAGNOSIS — Z7902 Long term (current) use of antithrombotics/antiplatelets: Secondary | ICD-10-CM | POA: Diagnosis not present

## 2015-12-26 DIAGNOSIS — I872 Venous insufficiency (chronic) (peripheral): Secondary | ICD-10-CM | POA: Diagnosis not present

## 2015-12-26 DIAGNOSIS — R262 Difficulty in walking, not elsewhere classified: Secondary | ICD-10-CM | POA: Diagnosis not present

## 2015-12-26 DIAGNOSIS — G629 Polyneuropathy, unspecified: Secondary | ICD-10-CM | POA: Diagnosis not present

## 2015-12-26 DIAGNOSIS — I5042 Chronic combined systolic (congestive) and diastolic (congestive) heart failure: Secondary | ICD-10-CM | POA: Diagnosis not present

## 2015-12-26 DIAGNOSIS — Z7952 Long term (current) use of systemic steroids: Secondary | ICD-10-CM | POA: Diagnosis not present

## 2015-12-26 DIAGNOSIS — I130 Hypertensive heart and chronic kidney disease with heart failure and stage 1 through stage 4 chronic kidney disease, or unspecified chronic kidney disease: Secondary | ICD-10-CM | POA: Diagnosis not present

## 2015-12-26 DIAGNOSIS — I739 Peripheral vascular disease, unspecified: Secondary | ICD-10-CM | POA: Diagnosis not present

## 2015-12-27 DIAGNOSIS — M25552 Pain in left hip: Secondary | ICD-10-CM | POA: Diagnosis not present

## 2015-12-30 DIAGNOSIS — M4800 Spinal stenosis, site unspecified: Secondary | ICD-10-CM | POA: Diagnosis not present

## 2015-12-30 DIAGNOSIS — Z7902 Long term (current) use of antithrombotics/antiplatelets: Secondary | ICD-10-CM | POA: Diagnosis not present

## 2015-12-30 DIAGNOSIS — I2510 Atherosclerotic heart disease of native coronary artery without angina pectoris: Secondary | ICD-10-CM | POA: Diagnosis not present

## 2015-12-30 DIAGNOSIS — I130 Hypertensive heart and chronic kidney disease with heart failure and stage 1 through stage 4 chronic kidney disease, or unspecified chronic kidney disease: Secondary | ICD-10-CM | POA: Diagnosis not present

## 2015-12-30 DIAGNOSIS — G629 Polyneuropathy, unspecified: Secondary | ICD-10-CM | POA: Diagnosis not present

## 2015-12-30 DIAGNOSIS — M1991 Primary osteoarthritis, unspecified site: Secondary | ICD-10-CM | POA: Diagnosis not present

## 2015-12-30 DIAGNOSIS — Z792 Long term (current) use of antibiotics: Secondary | ICD-10-CM | POA: Diagnosis not present

## 2015-12-30 DIAGNOSIS — I739 Peripheral vascular disease, unspecified: Secondary | ICD-10-CM | POA: Diagnosis not present

## 2015-12-30 DIAGNOSIS — I872 Venous insufficiency (chronic) (peripheral): Secondary | ICD-10-CM | POA: Diagnosis not present

## 2015-12-30 DIAGNOSIS — I5042 Chronic combined systolic (congestive) and diastolic (congestive) heart failure: Secondary | ICD-10-CM | POA: Diagnosis not present

## 2015-12-30 DIAGNOSIS — R262 Difficulty in walking, not elsewhere classified: Secondary | ICD-10-CM | POA: Diagnosis not present

## 2015-12-30 DIAGNOSIS — J449 Chronic obstructive pulmonary disease, unspecified: Secondary | ICD-10-CM | POA: Diagnosis not present

## 2015-12-30 DIAGNOSIS — Z7952 Long term (current) use of systemic steroids: Secondary | ICD-10-CM | POA: Diagnosis not present

## 2016-01-01 ENCOUNTER — Encounter: Payer: Self-pay | Admitting: *Deleted

## 2016-01-01 ENCOUNTER — Other Ambulatory Visit: Payer: Self-pay | Admitting: *Deleted

## 2016-01-01 DIAGNOSIS — I739 Peripheral vascular disease, unspecified: Secondary | ICD-10-CM | POA: Diagnosis not present

## 2016-01-01 DIAGNOSIS — G629 Polyneuropathy, unspecified: Secondary | ICD-10-CM | POA: Diagnosis not present

## 2016-01-01 DIAGNOSIS — I5042 Chronic combined systolic (congestive) and diastolic (congestive) heart failure: Secondary | ICD-10-CM | POA: Diagnosis not present

## 2016-01-01 DIAGNOSIS — Z7902 Long term (current) use of antithrombotics/antiplatelets: Secondary | ICD-10-CM | POA: Diagnosis not present

## 2016-01-01 DIAGNOSIS — R262 Difficulty in walking, not elsewhere classified: Secondary | ICD-10-CM | POA: Diagnosis not present

## 2016-01-01 DIAGNOSIS — M4800 Spinal stenosis, site unspecified: Secondary | ICD-10-CM | POA: Diagnosis not present

## 2016-01-01 DIAGNOSIS — Z792 Long term (current) use of antibiotics: Secondary | ICD-10-CM | POA: Diagnosis not present

## 2016-01-01 DIAGNOSIS — I872 Venous insufficiency (chronic) (peripheral): Secondary | ICD-10-CM | POA: Diagnosis not present

## 2016-01-01 DIAGNOSIS — Z7952 Long term (current) use of systemic steroids: Secondary | ICD-10-CM | POA: Diagnosis not present

## 2016-01-01 DIAGNOSIS — M1991 Primary osteoarthritis, unspecified site: Secondary | ICD-10-CM | POA: Diagnosis not present

## 2016-01-01 DIAGNOSIS — I130 Hypertensive heart and chronic kidney disease with heart failure and stage 1 through stage 4 chronic kidney disease, or unspecified chronic kidney disease: Secondary | ICD-10-CM | POA: Diagnosis not present

## 2016-01-01 DIAGNOSIS — J449 Chronic obstructive pulmonary disease, unspecified: Secondary | ICD-10-CM | POA: Diagnosis not present

## 2016-01-01 DIAGNOSIS — I2510 Atherosclerotic heart disease of native coronary artery without angina pectoris: Secondary | ICD-10-CM | POA: Diagnosis not present

## 2016-01-01 NOTE — Patient Outreach (Signed)
[facility]) Care Management  [place] CM Routine Home Visit/ Discharge, successfully met goals 01/01/2016  Haley Harris 1928-10-01 [id number]    Haley Harris is an 82 y.o. female referred to [facility] for transition of care after two recent IP hospital visits, March 17-29, 2017 for ARF with hypoxia related to flu and CHF and again on April 18-22, 2017, for hyponatremia following an acute fall at home where she incurred 50 stitches to a forehead laceration.   Today, Haley Harris reports that she has "been doing great and feeling great."  She states that this week, she has walked up the street to her niece's home, along with her daughter, while using her walker.  She also reports that she attended her grandson's wedding recently, and "danced at his wedding."  She reports that she has been getting in their home pool whenever she can around the weather, and states that it helps her "feel so much better."   Haley Harris that she is very happy about being more mobile and feeling better in general.  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  Haley Harris states that she is able to "walk up and down the stairs" to do her laundry, stating that she only does this activity when her daughter/ caregiver [name] is at home.  Haley Harris reports that she has not experienced any more falls, is using her walker 100% of the time, continuing to take her medications as prescribed, and has attended all scheduled provider appointments.   Haley Harris states that on Friday, December 27, 2015, she had a cortisone injection to her (L) hip for ongoing hip pain, and stated, "it is already so much better."  Haley Harris  reported a minimal level of pain today, stating that it is manageable with the medications she takes; Haley Harris states, "I barely know the pain is there."  Haley Harris reported that she will be going on a beach vacation with her family in mid-July and stated that she is very excited about the upcoming trip.  Haley Harris states that she continues monitoring and recording her weights daily, stating, "they are right where they need to be."  Upon review of her daily weight records, Haley Harris weight ranges have been 169-172 pounds; her baseline ankle/feet/leg edema is visibly minimal today, and she is wearing her compression stockings, which she reports wearing "every day."   Haley Harris denies concerns, questions, needs, or problems this afternoon.  Together, we acknowledged that Haley Harris has successfully met all of her [place] CM goals, and Haley Harris states that she agrees that she is ready for discharge from [facility] program.  Ms. Weld verbalized that she believes she so doing so well that she does not have a need for further [facility] CM services through telephonic health coaching.  I made sure that Haley Harris has my direct phone number, the 24-hour [facility] nurse line number, and the main number phone number for [facility] CM should she wish to contact [facility] for CM services in the future.  Subjective: "I'm feeling great!  I walked up the street to my niece's house this week and danced at my  grandson's wedding a couple of weeks ago."  Objective:    BP 124/72 mmHg  Pulse 58  Resp 16  Wt 171 lb (77.565 kg)  SpO2 96%   Review of Systems  Constitutional: Negative.  Negative for malaise/fatigue.  Respiratory: Negative.  Negative for cough, sputum production, shortness of breath and wheezing.   Cardiovascular: Positive for leg swelling. Negative for chest pain.       Bilateral LE edema minimal at +1; patient wearing bilateral compressin stockings  Gastrointestinal: Negative.   Genitourinary: Negative.    Musculoskeletal: Positive for joint pain. Negative for myalgias and falls.  Neurological: Negative.  Negative for weakness.  Psychiatric/Behavioral: Negative for depression. The patient is not nervous/anxious.     Physical Exam  Constitutional: She is oriented to person, place, and time. She appears well-developed and well-nourished.  Cardiovascular: Normal rate, regular rhythm, normal heart sounds and intact distal pulses.   Respiratory: Effort normal and breath sounds normal. No respiratory distress. She has no wheezes. She has no rales.  GI: Soft. Bowel sounds are normal.  Musculoskeletal: She exhibits edema.  See ROS  Neurological: She is alert and oriented to person, place, and time.  Skin: Skin is warm and dry.  Psychiatric: She has a normal mood and affect. Her behavior is normal. Judgment and thought content normal.    Encounter Medications:   Outpatient Encounter Prescriptions as of 01/01/2016  Medication Sig Note  . acetaminophen (TYLENOL) 325 MG tablet Take 2 tablets (650 mg total) by mouth every 4 (four) hours as needed for headache or mild pain.   Marland Kitchen albuterol (PROVENTIL HFA;VENTOLIN HFA) 108 (90 Base) MCG/ACT inhaler Inhale 2 puffs into the lungs every 6 (six) hours as needed for wheezing or shortness of breath. 11/20/2015: Used last yesterday; uses prn for excessive coughing  . albuterol (PROVENTIL) (2.5 MG/3ML) 0.083% nebulizer solution Take 3 mLs (2.5 mg total) by nebulization every 6 (six) hours as needed for wheezing or shortness of breath.   Marland Kitchen aspirin 81 MG tablet Take 81 mg by mouth daily after lunch.  11/05/2015: Pt takes at night   . Calcium Carbonate-Vitamin D (CALTRATE 600+D PO) Take 1 tablet by mouth daily after lunch.    . carvedilol (COREG) 12.5 MG tablet Take 1 tablet (12.5 mg total) by mouth 2 (two) times daily with a meal. (Patient taking differently: Take 6.25 mg by mouth 2 (two) times daily with a meal. ) 11/20/2015: Taking 6.25 mg as directed by cardiologist  with last OV   . denosumab (PROLIA) 60 MG/ML SOLN injection Inject 60 mg into the skin every 6 (six) months. Reported on 11/05/2015   . escitalopram (LEXAPRO) 10 MG tablet Take 10 mg by mouth daily.   . famotidine (PEPCID) 20 MG tablet Take 1 tablet (20 mg total) by mouth 2 (two) times daily.   . furosemide (LASIX) 40 MG tablet Take 1 tablet (40 mg total) by mouth 2 (two) times daily. 11/20/2015: PCP increased Lasix 40 mg BID for weights > 170 lbs; when weights < 170 lbs, patient takes Lasix 40 mg QD   . gabapentin (NEURONTIN) 100 MG capsule Take 1 capsule (100 mg total) by mouth 2 (two) times daily.   Marland Kitchen HYDROcodone-acetaminophen (NORCO/VICODIN) 5-325 MG tablet Take 1 tablet by mouth every 6 (six) hours as needed for moderate pain.   Marland Kitchen levocetirizine (XYZAL) 5 MG tablet Take 5 mg by mouth every evening.   Marland Kitchen losartan (COZAAR) 50 MG tablet TAKE 1 TABLET BY MOUTH DAILY   .  montelukast (SINGULAIR) 10 MG tablet TAKE 1 TABLET BY MOUTH DAILY 11/20/2015: "I stopped taking that because it didn't really help me.  The XYZAL is what I am using now."   . Multiple Vitamin (MULITIVITAMIN WITH MINERALS) TABS Take 1 tablet by mouth daily after lunch.    . pravastatin (PRAVACHOL) 80 MG tablet Take 1 tablet by mouth  daily   . spironolactone (ALDACTONE) 25 MG tablet Take 1 tablet by mouth  daily   . vitamin B-12 (CYANOCOBALAMIN) 500 MCG tablet Take 1,000 mcg by mouth 3 (three) times a week. Mon, Wed, Fri    No facility-administered encounter medications on file as of 01/01/2016.    Functional Status:   In your present state of health, do you have any difficulty performing the following activities: 11/02/2015 10/22/2015  Hearing? N Y  Vision? N N  Difficulty concentrating or making decisions? N N  Walking or climbing stairs? Y Y  Dressing or bathing? N Y  Doing errands, shopping? Tempie Donning  Preparing Food and eating ? - Y  Using the Toilet? - N  In the past six months, have you accidently leaked urine? - N  Do you have  problems with loss of bowel control? - N  Managing your Medications? - N  Managing your Finances? - N  Housekeeping or managing your Housekeeping? - Y    Fall/Depression Screening:    PHQ 2/9 Scores 10/22/2015 04/12/2015 03/02/2013  PHQ - 2 Score 2 - 0  PHQ- 9 Score 4 - -  Exception Documentation - Medical reason -    Assessment:  Ms. Branford is feeling and looking great and denies concerns, questions, needs, or problems today.  Ms. Kyllonen has successfully met her [place] CM goals and she agrees that she is ready for discharge.   Plan:   Will discharge patient from [facility], as she has successfully met all of her goals.    Oneta Rack, RN, BSN, [employer] [facility] Care Management  [phone]

## 2016-01-02 ENCOUNTER — Encounter: Payer: Self-pay | Admitting: *Deleted

## 2016-01-08 DIAGNOSIS — J449 Chronic obstructive pulmonary disease, unspecified: Secondary | ICD-10-CM | POA: Diagnosis not present

## 2016-01-08 DIAGNOSIS — R262 Difficulty in walking, not elsewhere classified: Secondary | ICD-10-CM | POA: Diagnosis not present

## 2016-01-08 DIAGNOSIS — I130 Hypertensive heart and chronic kidney disease with heart failure and stage 1 through stage 4 chronic kidney disease, or unspecified chronic kidney disease: Secondary | ICD-10-CM | POA: Diagnosis not present

## 2016-01-08 DIAGNOSIS — G629 Polyneuropathy, unspecified: Secondary | ICD-10-CM

## 2016-01-08 DIAGNOSIS — M1991 Primary osteoarthritis, unspecified site: Secondary | ICD-10-CM

## 2016-01-08 DIAGNOSIS — I5042 Chronic combined systolic (congestive) and diastolic (congestive) heart failure: Secondary | ICD-10-CM | POA: Diagnosis not present

## 2016-02-03 ENCOUNTER — Other Ambulatory Visit: Payer: Self-pay | Admitting: Internal Medicine

## 2016-02-11 ENCOUNTER — Other Ambulatory Visit (HOSPITAL_COMMUNITY): Payer: Self-pay | Admitting: Specialist

## 2016-02-11 DIAGNOSIS — M25551 Pain in right hip: Secondary | ICD-10-CM

## 2016-02-11 DIAGNOSIS — M545 Low back pain: Secondary | ICD-10-CM

## 2016-02-11 DIAGNOSIS — M25552 Pain in left hip: Principal | ICD-10-CM

## 2016-02-13 ENCOUNTER — Other Ambulatory Visit: Payer: Self-pay

## 2016-02-13 ENCOUNTER — Other Ambulatory Visit: Payer: Self-pay | Admitting: Internal Medicine

## 2016-02-13 MED ORDER — CLOPIDOGREL BISULFATE 75 MG PO TABS: 75.0000 mg | ORAL_TABLET | Freq: Every day | ORAL | 0 refills | Status: DC

## 2016-02-13 MED ORDER — LOSARTAN POTASSIUM 50 MG PO TABS: 50.0000 mg | ORAL_TABLET | Freq: Every day | ORAL | 0 refills | Status: DC

## 2016-02-13 NOTE — Telephone Encounter (Signed)
Pt left /vm requesting refill for 1 week for losartan and plavix to [facility] while waiting on mail order delivery. Refills done as requested and left v/m on pt home phone with info per DPR.

## 2016-02-21 DIAGNOSIS — I739 Peripheral vascular disease, unspecified: Secondary | ICD-10-CM | POA: Diagnosis not present

## 2016-02-21 DIAGNOSIS — E785 Hyperlipidemia, unspecified: Secondary | ICD-10-CM | POA: Diagnosis not present

## 2016-02-21 DIAGNOSIS — I2510 Atherosclerotic heart disease of native coronary artery without angina pectoris: Secondary | ICD-10-CM | POA: Diagnosis not present

## 2016-02-21 DIAGNOSIS — I119 Hypertensive heart disease without heart failure: Secondary | ICD-10-CM | POA: Diagnosis not present

## 2016-02-21 DIAGNOSIS — D649 Anemia, unspecified: Secondary | ICD-10-CM | POA: Diagnosis not present

## 2016-03-02 ENCOUNTER — Other Ambulatory Visit (HOSPITAL_COMMUNITY): Payer: Self-pay | Admitting: Specialist

## 2016-03-02 ENCOUNTER — Encounter (HOSPITAL_COMMUNITY)
Admission: RE | Admit: 2016-03-02 | Discharge: 2016-03-02 | Disposition: A | Discharge status: Home / Self Care | Payer: Medicare Other | Source: Ambulatory Visit | Attending: Specialist | Admitting: Specialist

## 2016-03-02 DIAGNOSIS — M545 Low back pain: Secondary | ICD-10-CM

## 2016-03-02 DIAGNOSIS — M25551 Pain in right hip: Secondary | ICD-10-CM

## 2016-03-02 DIAGNOSIS — M25552 Pain in left hip: Secondary | ICD-10-CM | POA: Diagnosis not present

## 2016-03-02 MED ORDER — TECHNETIUM TC 99M MEDRONATE IV KIT
25.0000 | PACK | Freq: Once | INTRAVENOUS | Status: AC | PRN
  Administered 2016-03-02: 25 via INTRAVENOUS

## 2016-03-03 ENCOUNTER — Encounter: Payer: Self-pay | Admitting: Internal Medicine

## 2016-03-03 ENCOUNTER — Telehealth: Payer: Self-pay | Admitting: Internal Medicine

## 2016-03-03 ENCOUNTER — Ambulatory Visit (INDEPENDENT_AMBULATORY_CARE_PROVIDER_SITE_OTHER): Payer: Medicare Other | Admitting: Internal Medicine

## 2016-03-03 VITALS — BP 128/60 | HR 84 | Temp 98.2°F | Wt 174.0 lb

## 2016-03-03 DIAGNOSIS — J449 Chronic obstructive pulmonary disease, unspecified: Secondary | ICD-10-CM

## 2016-03-03 DIAGNOSIS — I739 Peripheral vascular disease, unspecified: Secondary | ICD-10-CM

## 2016-03-03 DIAGNOSIS — I10 Essential (primary) hypertension: Secondary | ICD-10-CM

## 2016-03-03 DIAGNOSIS — I5042 Chronic combined systolic (congestive) and diastolic (congestive) heart failure: Secondary | ICD-10-CM

## 2016-03-03 DIAGNOSIS — F39 Unspecified mood [affective] disorder: Secondary | ICD-10-CM

## 2016-03-03 DIAGNOSIS — M810 Age-related osteoporosis without current pathological fracture: Secondary | ICD-10-CM

## 2016-03-03 LAB — RENAL FUNCTION PANEL
ALBUMIN: 4.2 g/dL (ref 3.5–5.2)
BUN: 27 mg/dL — ABNORMAL HIGH (ref 6–23)
CALCIUM: 8.7 mg/dL (ref 8.4–10.5)
CO2: 31 meq/L (ref 19–32)
CREATININE: 1.22 mg/dL — AB (ref 0.40–1.20)
Chloride: 91 mEq/L — ABNORMAL LOW (ref 96–112)
GFR: 44.53 mL/min — AB (ref >60.00)
Glucose, Bld: 129 mg/dL — ABNORMAL HIGH (ref 70–99)
Phosphorus: 4.7 mg/dL — ABNORMAL HIGH (ref 2.3–4.6)
Potassium: 4.5 mEq/L (ref 3.5–5.1)
Sodium: 129 mEq/L — ABNORMAL LOW (ref 135–145)

## 2016-03-03 LAB — LIPID PANEL
CHOL/HDL RATIO: 2
Cholesterol: 147 mg/dL (ref 0–200)
HDL: 85.5 mg/dL (ref >39.00)
LDL Cholesterol: 52 mg/dL (ref 0–99)
NONHDL: 61.63
Triglycerides: 47 mg/dL (ref 0.0–149.0)
VLDL: 9.4 mg/dL (ref 0.0–40.0)

## 2016-03-03 MED ORDER — MONTELUKAST SODIUM 10 MG PO TABS: ORAL_TABLET | ORAL | 3 refills | Status: DC

## 2016-03-03 MED ORDER — PRAVASTATIN SODIUM 80 MG PO TABS: ORAL_TABLET | ORAL | 3 refills | Status: DC

## 2016-03-03 MED ORDER — FAMOTIDINE 20 MG PO TABS: 20.0000 mg | ORAL_TABLET | Freq: Two times a day (BID) | ORAL | 3 refills | Status: DC

## 2016-03-03 NOTE — Progress Notes (Signed)
Subjective:    Patient ID: Haley Harris, female    DOB: April 17, 1929, 82 y.o.   MRN: [id number]  HPI Here with daughter For follow up of CHF and mood issues  Things have been pretty good Getting more rhinorrhea and eye drainage Gets tickle cough On singulair, flonase and xyzal Seems worst at night--wakes with crusty nose, etc Discussed adding fexofenadine  She checks her BP first thing in AM--- will be 200+/100 Then gets up, does some things and rechecks 150/70 (even before meds) May be as low as 95/50 No dizziness or syncope No chest pain  Weighs daily Did go up a little with recent travel If goes to 175#, she increases the furosemide to bid--till it is back down again  She had been enjoying life Seeing family and travelling Not depressed  Breathing is okay Still has annoying tickle cough No sputum Uses the nebulizer most mornings  Walks with the rollator Gets pain in knees with walking--but not clear claudication Seems to be arthritic Gets pain meds from Dr Louanne Skye  Current Outpatient Prescriptions on File Prior to Visit  Medication Sig Dispense Refill  . acetaminophen (TYLENOL) 325 MG tablet Take 2 tablets (650 mg total) by mouth every 4 (four) hours as needed for headache or mild pain.    Marland Kitchen albuterol (PROVENTIL) (2.5 MG/3ML) 0.083% nebulizer solution Take 3 mLs (2.5 mg total) by nebulization every 6 (six) hours as needed for wheezing or shortness of breath. 540 mL 3  . aspirin 81 MG tablet Take 81 mg by mouth daily after lunch.     . Calcium Carbonate-Vitamin D (CALTRATE 600+D PO) Take 1 tablet by mouth daily after lunch.     . carvedilol (COREG) 12.5 MG tablet Take 1 tablet by mouth two  times daily with a meal 180 tablet 0  . denosumab (PROLIA) 60 MG/ML SOLN injection Inject 60 mg into the skin every 6 (six) months. Reported on 11/05/2015    . escitalopram (LEXAPRO) 10 MG tablet Take 10 mg by mouth daily.    . famotidine (PEPCID) 20 MG tablet Take 1 tablet by mouth  two  times daily 180 tablet 0  . furosemide (LASIX) 40 MG tablet Take 1 tablet (40 mg total) by mouth 2 (two) times daily. 60 tablet 0  . gabapentin (NEURONTIN) 100 MG capsule Take 1 capsule (100 mg total) by mouth 2 (two) times daily. 180 capsule 3  . levocetirizine (XYZAL) 5 MG tablet Take 5 mg by mouth every evening.    Marland Kitchen losartan (COZAAR) 50 MG tablet Take 1 tablet (50 mg total) by mouth daily. 7 tablet 0  . montelukast (SINGULAIR) 10 MG tablet TAKE 1 TABLET BY MOUTH DAILY 90 tablet 1  . Multiple Vitamin (MULITIVITAMIN WITH MINERALS) TABS Take 1 tablet by mouth daily after lunch.     . pravastatin (PRAVACHOL) 80 MG tablet Take 1 tablet by mouth  daily 90 tablet 3  . PROAIR HFA 108 (90 Base) MCG/ACT inhaler USE 2 PUFFS EVERY 6 HOURS AS NEEDED FOR WHEEZING OR SHORTNESS OF BREATH 8.5 g 5  . spironolactone (ALDACTONE) 25 MG tablet Take 1 tablet by mouth  daily 90 tablet 3  . vitamin B-12 (CYANOCOBALAMIN) 500 MCG tablet Take 1,000 mcg by mouth 3 (three) times a week. Mon, Wed, Fri     No current facility-administered medications on file prior to visit.     Allergies  Allergen Reactions  . Amoxicillin-Pot Clavulanate Anaphylaxis  . Cephalexin Other (See Comments)  Reaction unknown  . Clarithromycin Other (See Comments)    Reaction unknown  . Lidocaine     Patient had tremors following use of lidocaine pain patches  . Nifedipine Other (See Comments)    Reaction unknown  . Nsaids Other (See Comments)    Heart issue  . Olmesartan Medoxomil     REACTION: cough;  But tolerating Losartan 02/2014  . Penicillins   . Tramadol Hcl Other (See Comments)    Reaction unknown    Past Medical History:  Diagnosis Date  . ACE-inhibitor cough   . Allergic rhinitis, cause unspecified   . Angina   . Anxiety   . CHF (congestive heart failure) ([place])   . Chronic airway obstruction, not elsewhere classified   . Compression fracture of T12 vertebra ([place]) 10/28/2011  . Depressive disorder, not  elsewhere classified   . Esophageal reflux   . Heart murmur    aS CHILD  . Malignant neoplasm of breast (female), unspecified site   . Myocardial infarct ([place])   . 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 Occlusion and stenosis of carotid artery without mention of cerebral infarction   . Osteoarthrosis, unspecified whether generalized or localized, unspecified site   . Osteoporosis, unspecified   . Other and unspecified hyperlipidemia   . Other dyspnea and respiratory abnormality   . Peripheral vascular disease, unspecified ([place])   . Personal history of malignant neoplasm of breast   . Pneumonia   . Recurrent upper respiratory infection (URI)   . Shortness of breath   . Spinal stenosis   . Unspecified cardiovascular disease   . Unspecified essential hypertension   . Unspecified hearing loss   . Unspecified urinary incontinence     Past Surgical History:  Procedure Laterality Date  . ABDOMINAL HYSTERECTOMY    . APPENDECTOMY  1948  . BREAST IMPLANT REMOVAL  06/12/09   right  . BREAST RECONSTRUCTION  1998   Reconstruction   . CARDIAC CATHETERIZATION N/A 01/25/2015   Procedure: Left Heart Cath and Coronary Angiography;  Surgeon: Charolette Forward, MD;  Location: [facility] CV LAB;  Service: Cardiovascular;  Laterality: N/A;  . CORONARY ANGIOPLASTY  11/12   distal RCA  . FLEXIBLE SIGMOIDOSCOPY N/A 01/21/2015   Procedure: FLEXIBLE SIGMOIDOSCOPY;  Surgeon: Richmond Campbell, MD;  Location[facility] ENDOSCOPY;  Service: Endoscopy;  Laterality: N/A;  . FLEXIBLE SIGMOIDOSCOPY  01/22/2015      . FRACTURE SURGERY     fracture right elbow  . LEFT HEART CATHETERIZATION WITH CORONARY ANGIOGRAM N/A 06/05/2011   Procedure: LEFT HEART CATHETERIZATION WITH CORONARY ANGIOGRAM;  Surgeon: Clent Demark, MD;  Location: [facility] CATH LAB;  Service: Cardiovascular;  Laterality: N/A;  . LEFT HEART CATHETERIZATION WITH CORONARY ANGIOGRAM N/A 03/05/2014   Procedure: LEFT HEART CATHETERIZATION WITH  CORONARY ANGIOGRAM;  Surgeon: Clent Demark, MD;  Location: [facility] CATH LAB;  Service: Cardiovascular;  Laterality: N/A;  . LUMBAR LAMINECTOMY  2003  . MASTECTOMY  1987   Right  . MASTECTOMY    . MASTOIDECTOMY  childhood  . REDUCTION MAMMAPLASTY  1998   Left  . SHOULDER SURGERY  02/2005   Bilateral fractures with multiple surgeries    Family History  Problem Relation Age of Onset  . Heart attack Father 25  . Cancer Mother     ovarian  . Cancer Brother     lung  . Arthritis      family    Social History   Social History  .  Marital status: Widowed    Spouse name: N/A  . Number of children: 2  . Years of education: N/A   Occupational History  . 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  [employer]   Social History Main Topics  . Smoking status: Former Smoker    Quit date: 07/13/1974  . Smokeless tobacco: Never Used  . Alcohol use 0.0 oz/week     Comment: occasional  . Drug use: No  . Sexual activity: No   Other Topics Concern  . Not on file   Social History Narrative   Has living will   Son or daughter would be health care POA.   Requests DNR--written 03/02/13   No tube feeds if cognitively unaware   Review of Systems Appetite is good Sleeps fairly well--- in bed. Uses only 1 pillow. No PND Some nocturia    Objective:   Physical Exam  Constitutional: She appears well-nourished. No distress.  Neck: Normal range of motion. Neck supple.  Cardiovascular: Normal rate, regular rhythm and normal heart sounds.  Exam reveals no gallop.   No murmur heard. No pedal pulses palpable  Pulmonary/Chest: Effort normal and breath sounds normal. No respiratory distress. She has no wheezes. She has no rales.  Abdominal: Soft. There is no tenderness.  Musculoskeletal: She exhibits no edema.  Lymphadenopathy:    She has no cervical adenopathy.  Psychiatric: She has a normal mood and affect. Her behavior is normal.  Bright and cheery          Assessment &  Plan:

## 2016-03-03 NOTE — Assessment & Plan Note (Signed)
Has done well Weight up some due to travel and dietary changes--but has increased the furosemide Stable functional status

## 2016-03-03 NOTE — Assessment & Plan Note (Signed)
BP Readings from Last 3 Encounters:  03/03/16 128/60  01/01/16 124/72  11/29/15 104/74   It sounds like her AM elevations are a technical problem (?from lying on her left arm all night and then checking) She will check intermittently at other times No change

## 2016-03-03 NOTE — Telephone Encounter (Signed)
Pt need prolia shot in oct  Is it ok to schedule??

## 2016-03-03 NOTE — Progress Notes (Signed)
Pre visit review using our clinic review tool, if applicable. No additional management support is needed unless otherwise documented below in the visit note. 

## 2016-03-03 NOTE — Assessment & Plan Note (Signed)
Mostly just tickle cough Uses the nebs mostly in the AM stable

## 2016-03-03 NOTE — Telephone Encounter (Signed)
Yes he does. They discussed it at the [facility] today

## 2016-03-03 NOTE — Patient Instructions (Signed)
Please add fexofenadine 180mg  daily to your allergy regimen.

## 2016-03-03 NOTE — Assessment & Plan Note (Signed)
Pain in legs from her severe arthritis Doesn't seem to be able to move fast enough to get true claudication

## 2016-03-03 NOTE — Assessment & Plan Note (Signed)
Markedly better  If stable next time, will consider decreasing the escitalopram

## 2016-03-03 NOTE — Telephone Encounter (Signed)
Does Dr. Silvio Pate want to continue Prolia for Ms. Boldon?  Thank you!

## 2016-03-04 NOTE — Telephone Encounter (Signed)
I have electronically submitted pt's info for Prolia insurance verification and will notify you once I have a response. Thank you. °

## 2016-03-10 ENCOUNTER — Other Ambulatory Visit: Payer: Self-pay | Admitting: Internal Medicine

## 2016-03-11 ENCOUNTER — Encounter: Payer: Self-pay | Admitting: Podiatry

## 2016-03-11 ENCOUNTER — Ambulatory Visit (INDEPENDENT_AMBULATORY_CARE_PROVIDER_SITE_OTHER): Payer: Medicare Other | Admitting: Podiatry

## 2016-03-11 DIAGNOSIS — B351 Tinea unguium: Secondary | ICD-10-CM | POA: Diagnosis not present

## 2016-03-11 DIAGNOSIS — M79676 Pain in unspecified toe(s): Secondary | ICD-10-CM

## 2016-03-11 NOTE — Progress Notes (Signed)
She presents today for chief complaint of painful elongated toenails bilateral.  Objective: Pulses remain palpable bilateral. Toenails are thick yellow dystrophic with mycotic painful palpation. No open lesions or wounds.  Assessment: Pain in limb secondary to onychomycosis.  Plan: Debridement of toenails 1 through 5 bilateral.

## 2016-03-18 NOTE — Telephone Encounter (Signed)
I have rec'd patient's insurance verification for Prolia and she has an estimated responsibility of $40.  Please make pt aware this is an estimate and we will not know an exact amt until insurance(s) has/have paid.  I have sent a copy of the summary of benefits to be scanned into pt's chart.    Once pt recs injection, please let me know actual injection date so I can update the Prolia portal.  If you have any questions, please let me know.  Thank you!

## 2016-03-18 NOTE — Telephone Encounter (Signed)
The last Prolia was done 11-07-15. Last Calcium Lab was 11-02-15. Does pt need labs before having Prolia?

## 2016-03-19 NOTE — Telephone Encounter (Signed)
Only if it is required in the prescribing information

## 2016-03-24 NOTE — Telephone Encounter (Signed)
Yes, she will need at the least a calcium lab. I have left a message for the pt to call the office

## 2016-03-26 DIAGNOSIS — M25552 Pain in left hip: Secondary | ICD-10-CM | POA: Diagnosis not present

## 2016-03-26 DIAGNOSIS — M542 Cervicalgia: Secondary | ICD-10-CM | POA: Diagnosis not present

## 2016-03-27 ENCOUNTER — Other Ambulatory Visit: Payer: Self-pay | Admitting: Specialist

## 2016-03-27 DIAGNOSIS — M542 Cervicalgia: Secondary | ICD-10-CM

## 2016-04-01 ENCOUNTER — Telehealth: Payer: Self-pay | Admitting: Internal Medicine

## 2016-04-01 NOTE — Addendum Note (Signed)
Addended by: [name] I on: 04/01/2016 01:22 PM   Modules accepted: Orders

## 2016-04-01 NOTE — Telephone Encounter (Signed)
Opened in error

## 2016-04-01 NOTE — Telephone Encounter (Signed)
Pt is scheduled for calcium lab on 05-04-16 for her Prolia that will be done 05-07-16. Please place a future order. Thanks

## 2016-04-08 ENCOUNTER — Other Ambulatory Visit: Payer: Self-pay

## 2016-04-09 ENCOUNTER — Ambulatory Visit
Admission: RE | Admit: 2016-04-09 | Discharge: 2016-04-09 | Disposition: A | Discharge status: Home / Self Care | Payer: Medicare Other | Source: Ambulatory Visit | Attending: Specialist | Admitting: Specialist

## 2016-04-09 DIAGNOSIS — M50221 Other cervical disc displacement at C4-C5 level: Secondary | ICD-10-CM | POA: Diagnosis not present

## 2016-04-09 DIAGNOSIS — M542 Cervicalgia: Secondary | ICD-10-CM

## 2016-04-27 NOTE — Telephone Encounter (Signed)
[place], Ms. Cardullo last injection was 11/07/2015 and insurance requires six months and a day before the next once can be administered so you may want to call Ms. Leboeuf and re-schedule her for 05/09/2016 or later.  Thank you!

## 2016-05-04 ENCOUNTER — Other Ambulatory Visit (INDEPENDENT_AMBULATORY_CARE_PROVIDER_SITE_OTHER): Payer: Medicare Other

## 2016-05-04 ENCOUNTER — Other Ambulatory Visit: Payer: Self-pay

## 2016-05-04 DIAGNOSIS — M810 Age-related osteoporosis without current pathological fracture: Secondary | ICD-10-CM

## 2016-05-04 LAB — CALCIUM: CALCIUM: 9.2 mg/dL (ref 8.4–10.5)

## 2016-05-07 ENCOUNTER — Ambulatory Visit (INDEPENDENT_AMBULATORY_CARE_PROVIDER_SITE_OTHER): Payer: Medicare Other

## 2016-05-07 DIAGNOSIS — Z23 Encounter for immunization: Secondary | ICD-10-CM | POA: Diagnosis not present

## 2016-05-12 ENCOUNTER — Ambulatory Visit (INDEPENDENT_AMBULATORY_CARE_PROVIDER_SITE_OTHER): Payer: Medicare Other | Admitting: *Deleted

## 2016-05-12 DIAGNOSIS — M810 Age-related osteoporosis without current pathological fracture: Secondary | ICD-10-CM | POA: Diagnosis not present

## 2016-05-12 MED ORDER — DENOSUMAB 60 MG/ML ~~LOC~~ SOLN
60.0000 mg | Freq: Once | SUBCUTANEOUS | Status: AC
  Administered 2016-05-12: 60 mg via SUBCUTANEOUS

## 2016-05-15 ENCOUNTER — Encounter: Payer: Self-pay | Admitting: Physical Medicine & Rehabilitation

## 2016-05-16 ENCOUNTER — Other Ambulatory Visit: Payer: Self-pay | Admitting: Internal Medicine

## 2016-05-29 ENCOUNTER — Encounter: Payer: Self-pay | Admitting: Physical Medicine & Rehabilitation

## 2016-05-29 ENCOUNTER — Encounter: Payer: Medicare Other | Attending: Physical Medicine & Rehabilitation

## 2016-05-29 ENCOUNTER — Ambulatory Visit (HOSPITAL_BASED_OUTPATIENT_CLINIC_OR_DEPARTMENT_OTHER): Payer: Medicare Other | Admitting: Physical Medicine & Rehabilitation

## 2016-05-29 VITALS — BP 125/75 | HR 76 | Resp 143

## 2016-05-29 DIAGNOSIS — M1612 Unilateral primary osteoarthritis, left hip: Secondary | ICD-10-CM

## 2016-05-29 DIAGNOSIS — G8929 Other chronic pain: Secondary | ICD-10-CM | POA: Insufficient documentation

## 2016-05-29 DIAGNOSIS — M542 Cervicalgia: Secondary | ICD-10-CM | POA: Diagnosis not present

## 2016-05-29 DIAGNOSIS — M549 Dorsalgia, unspecified: Secondary | ICD-10-CM | POA: Insufficient documentation

## 2016-05-29 DIAGNOSIS — M170 Bilateral primary osteoarthritis of knee: Secondary | ICD-10-CM

## 2016-05-29 DIAGNOSIS — M4802 Spinal stenosis, cervical region: Secondary | ICD-10-CM | POA: Diagnosis not present

## 2016-05-29 DIAGNOSIS — M25511 Pain in right shoulder: Secondary | ICD-10-CM | POA: Diagnosis not present

## 2016-05-29 DIAGNOSIS — M19041 Primary osteoarthritis, right hand: Secondary | ICD-10-CM | POA: Diagnosis not present

## 2016-05-29 DIAGNOSIS — M1610 Unilateral primary osteoarthritis, unspecified hip: Secondary | ICD-10-CM | POA: Diagnosis not present

## 2016-05-29 DIAGNOSIS — M25551 Pain in right hip: Secondary | ICD-10-CM | POA: Diagnosis not present

## 2016-05-29 DIAGNOSIS — M25562 Pain in left knee: Secondary | ICD-10-CM | POA: Diagnosis not present

## 2016-05-29 DIAGNOSIS — M19042 Primary osteoarthritis, left hand: Secondary | ICD-10-CM | POA: Diagnosis not present

## 2016-05-29 DIAGNOSIS — M25561 Pain in right knee: Secondary | ICD-10-CM | POA: Insufficient documentation

## 2016-05-29 DIAGNOSIS — M25552 Pain in left hip: Secondary | ICD-10-CM | POA: Insufficient documentation

## 2016-05-29 DIAGNOSIS — Z5181 Encounter for therapeutic drug level monitoring: Secondary | ICD-10-CM

## 2016-05-29 NOTE — Addendum Note (Signed)
Addended by: Elinor Parkinson I on: 05/29/2016 03:26 PM   Modules accepted: Orders

## 2016-05-29 NOTE — Addendum Note (Signed)
Addended by: Elinor Parkinson I on: 05/29/2016 03:16 PM   Modules accepted: Orders

## 2016-05-29 NOTE — Progress Notes (Signed)
Subjective:    Patient ID: Haley Harris, female    DOB: 09/08/1928, 82 y.o.   MRN: [id number]  HPI Chief complaint widespread body pain 82 year old female with history of osteoarthritis who complains of low back pain and knee pain mostly in the morning. This improves somewhat when she gets up. She also has neck pain that increases at times with extension of the neck. She also complains of bilateral thumb pain when she is doing something like reading a book. Needing also bothers her hands. She's had orthopedic evaluation. Dr. Merrilee Seashore. In addition, she has bilateral hip pain. She has responded well to a right hip injection performed in June 2017 that by Dr. Ernestina Patches. This has worn off. She's had nuclear medicine bone scan on 03/02/2016. Showing some increased activity right lower lid, ribs, no evidence of metastatic disease to the bone. Moderate to severe degenerative changes are at the knees. Prior thoracic compression fractures. Has been evaluated for neck pain as well and recommended to try nonnarcotic medications first. Patient has been on narcotic analgesics, hydrocodone, several years ago and more recently, oxycodone 5/325, which she takes 2 tablets per day on average  Multiple falls, osteoporosis, last fall was around [place]. Patient notes that her sodium level was low at that time.   Has been safer since switch from cane to walker. Other than the fall associated with the hyponatremia, has not fallen with the walker  Has been through outpatient physical therapy to reduce fall risk  Past surgical history also positive for right elbow replacement, left elbow ORIF Pain Inventory Average Pain 10 Pain Right Now 7 My pain is intermittent, constant, sharp, stabbing and aching  In the last 24 hours, has pain interfered with the following? General activity 5 Relation with others 5 Enjoyment of life 5 What TIME of day is your pain at its worst? varies Sleep (in general) Fair  Pain is worse with:  walking, bending, standing and some activites Pain improves with: heat/ice, medication and injections Relief from Meds: 8  Mobility walk with assistance use a walker how many minutes can you walk? 3-5 ability to climb steps?  yes do you drive?  no Do you have any goals in this area?  yes  Function retired I need assistance with the following:  dressing, meal prep, household duties and shopping  Neuro/Psych bladder control problems numbness tremor trouble walking depression anxiety  Prior Studies bone scan x-rays CT/MRI new visit CLINICAL DATA:  Chronic neck pain, worsening for 2 months. Evaluate for herniated nucleus pulposis or stenosis. History of breast cancer, spinal stenosis.  EXAM: MRI CERVICAL SPINE WITHOUT CONTRAST  TECHNIQUE: Multiplanar, multisequence MR imaging of the cervical spine was performed. No intravenous contrast was administered.  COMPARISON:  CT cervical spine October 26, 2015  FINDINGS: ALIGNMENT: Straightened cervical lordosis. N minimal grade 1 C4-5 anterolisthesis, unchanged.  VERTEBRAE/DISCS: Cervical vertebral bodies intact. Moderate T1 vertebral body wedging with similar height loss. Moderate to severe C6-7 disc height loss is unchanged. Remaining disc morphology is preserved with decreased T2 signal within all disc compatible with desiccation. Superimposed trace bright STIR signal within the central disc at C6-7 most compatible with edema. Mild acute on chronic discogenic endplate change at [id number]. Bright T1 and bright T2 to bone marrow signal compatible with osteopenia. Bright STIR signal LEFT C3-4 in to lesser extent LEFT C2-3 facets consistent with bone marrow edema.  CORD:Cervical spinal cord is normal morphology and signal characteristics from the cervicomedullary junction to level of T1-2,  the most caudal well visualized level.  POSTERIOR FOSSA, VERTEBRAL ARTERIES, PARASPINAL TISSUES: No MR findings of ligamentous  injury. Vertebral artery flow voids present. Bright STIR signal about the LEFT upper cervical facet compatible with reactive changes/low-grade strain Moderate to severe symmetric paraspinal muscle atrophy.  DISC LEVELS:  C2-3: Small broad-based disc bulge. Uncovertebral hypertrophy. Severe bilateral facet arthropathy with trace LEFT facet effusion. No canal stenosis. Moderate to severe neural foraminal narrowing.  C3-4: Annular bulging, uncovertebral hypertrophy and severe bilateral facet arthropathy, trace LEFT facet effusion. Severe RIGHT, moderate to severe LEFT neural foraminal narrowing.  C4-5: Anterolisthesis. Uncovertebral hypertrophy and severe RIGHT grand LEFT facet arthropathy. No canal stenosis. Moderate bilateral neural foraminal narrowing.  C5-6: Uncovertebral hypertrophy. Severe RIGHT and moderate LEFT facet arthropathy. No canal stenosis. Moderate RIGHT, moderate to severe LEFT neural foraminal narrowing.  C6-7: Small broad-based disc bulge, uncovertebral hypertrophy and mild facet arthropathy. No canal stenosis. Moderate RIGHT, moderate to severe LEFT neural foraminal narrowing.  C7-T1: No disc bulge, canal stenosis nor neural foraminal narrowing.  IMPRESSION: Multilevel severe facet arthropathy, with bone marrow edema and effusion LEFT C2-3 and C3-4 facets most compatible with inflammatory changes.  Minimal grade 1 C4-5 anterolisthesis on degenerative basis.  No canal stenosis. Neural foraminal narrowing C2-3 through C6-7: Severe on the RIGHT at C3-4, moderate to severe at multiple levels.   Electronically Signed   By: Elon Alas M.D.   On: 04/09/2016 22:09Physicians involved in your care new visit   Family History  Problem Relation Age of Onset  . Heart attack Father 37  . Cancer Mother     ovarian  . Cancer Brother     lung  . Arthritis      family   Social History   Social History  . Marital status: Widowed    Spouse  name: N/A  . Number of children: 2  . Years of education: N/A   Occupational History  . 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  [employer]   Social History Main Topics  . Smoking status: Former Smoker    Quit date: 07/13/1974  . Smokeless tobacco: Never Used  . Alcohol use 0.0 oz/week     Comment: occasional  . Drug use: No  . Sexual activity: No   Other Topics Concern  . None   Social History Narrative   Has living will   Son or daughter would be health care POA.   Requests DNR--written 03/02/13   No tube feeds if cognitively unaware   Past Surgical History:  Procedure Laterality Date  . ABDOMINAL HYSTERECTOMY    . APPENDECTOMY  1948  . BREAST IMPLANT REMOVAL  06/12/09   right  . BREAST RECONSTRUCTION  1998   Reconstruction   . CARDIAC CATHETERIZATION N/A 01/25/2015   Procedure: Left Heart Cath and Coronary Angiography;  Surgeon: Charolette Forward, MD;  Location: [facility];  Service: Cardiovascular;  Laterality: N/A;  . CORONARY ANGIOPLASTY  11/12   distal RCA  . FLEXIBLE SIGMOIDOSCOPY N/A 01/21/2015   Procedure: FLEXIBLE SIGMOIDOSCOPY;  Surgeon: Richmond Campbell, MD;  Location: [facility] ENDOSCOPY;  Service: Endoscopy;  Laterality: N/A;  . FLEXIBLE SIGMOIDOSCOPY  01/22/2015      . FRACTURE SURGERY     fracture right elbow  . LEFT HEART CATHETERIZATION WITH CORONARY ANGIOGRAM N/A 06/05/2011   Procedure: LEFT HEART CATHETERIZATION WITH CORONARY ANGIOGRAM;  Surgeon: Clent Demark, MD;  Location: [facility] CATH LAB;  Service: Cardiovascular;  Laterality: N/A;  .  LEFT HEART CATHETERIZATION WITH CORONARY ANGIOGRAM N/A 03/05/2014   Procedure: LEFT HEART CATHETERIZATION WITH CORONARY ANGIOGRAM;  Surgeon: Clent Demark, MD;  Location: [facility] CATH LAB;  Service: Cardiovascular;  Laterality: N/A;  . LUMBAR LAMINECTOMY  2003  . MASTECTOMY  1987   Right  . MASTECTOMY    . MASTOIDECTOMY  childhood  . REDUCTION MAMMAPLASTY  1998   Left  . SHOULDER SURGERY  02/2005   Bilateral  fractures with multiple surgeries   Past Medical History:  Diagnosis Date  . ACE-inhibitor cough   . Allergic rhinitis, cause unspecified   . Angina   . Anxiety   . CHF (congestive heart failure) ([place])   . Chronic airway obstruction, not elsewhere classified   . Compression fracture of T12 vertebra ([place]) 10/28/2011  . Depressive disorder, not elsewhere classified   . Esophageal reflux   . Heart murmur    aS CHILD  . Malignant neoplasm of breast (female), unspecified site   . Myocardial infarct   . 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 Occlusion and stenosis of carotid artery without mention of cerebral infarction   . Osteoarthrosis, unspecified whether generalized or localized, unspecified site   . Osteoporosis, unspecified   . Other and unspecified hyperlipidemia   . Other dyspnea and respiratory abnormality   . Peripheral vascular disease, unspecified   . Personal history of malignant neoplasm of breast   . Pneumonia   . Recurrent upper respiratory infection (URI)   . Shortness of breath   . Spinal stenosis   . Unspecified cardiovascular disease   . Unspecified essential hypertension   . Unspecified hearing loss   . Unspecified urinary incontinence    BP 125/75 (BP Location: Right Arm, Patient Position: Sitting, Cuff Size: Large)   Pulse 76   Resp (!) 143   SpO2 95%   Opioid Risk Score:   Fall Risk Score:  `1  Depression screen PHQ 2/9  Depression screen [facility] 2/9 10/22/2015 03/02/2013  Decreased Interest 1 0  Down, Depressed, Hopeless 1 0  PHQ - 2 Score 2 0  Altered sleeping 0 -  Tired, decreased energy 1 -  Change in appetite 1 -  Feeling bad or failure about yourself  0 -  Trouble concentrating 0 -  Moving slowly or fidgety/restless 0 -  Suicidal thoughts 0 -  PHQ-9 Score 4 -  Difficult doing work/chores Not difficult at all -  Some recent data might be hidden    Review of Systems  Constitutional: Positive for appetite change and  unexpected weight change.  Respiratory: Positive for cough, shortness of breath and wheezing.   Cardiovascular: Positive for leg swelling.  Gastrointestinal: Positive for constipation and diarrhea.  Endocrine: Negative.   Genitourinary: Negative.   Musculoskeletal: Positive for arthralgias, back pain, gait problem, joint swelling, myalgias, neck pain and neck stiffness.  Skin: Negative.   Allergic/Immunologic: Negative.   Neurological: Positive for tremors and numbness.  Hematological: Negative.   Psychiatric/Behavioral: Positive for dysphoric mood. The patient is nervous/anxious.        Objective:   Physical Exam  Constitutional: She is oriented to person, place, and time. She appears well-developed and well-nourished.  HENT:  Head: Normocephalic and atraumatic.  Eyes: Conjunctivae and EOM are normal. Pupils are equal, round, and reactive to light.  Cardiovascular: Normal rate, regular rhythm and normal heart sounds.   No murmur heard. Pulmonary/Chest: Effort normal and breath sounds normal. No respiratory distress. She has no wheezes.  Abdominal: Bowel sounds are normal. She exhibits no distension. There is no tenderness.  Musculoskeletal:       Thoracic back: She exhibits deformity.  Thoracic kyphosis  Neurological: She is alert and oriented to person, place, and time. She has normal strength. She displays no tremor. A sensory deficit is present. She exhibits normal muscle tone. Gait abnormal.  Below knee pain  Psychiatric: She has a normal mood and affect. Her behavior is normal. Judgment and thought content normal.  Nursing note and vitals reviewed.  decreased hip internal/external rotation right and the left side. No pain with knee range of motion. Cervical range of motion is 25% flexion, extension, lateral bending and rotation        Assessment & Plan:  1. Chronic neck pain, currently not exhibiting any signs of radiculopathy. She has mainly axial neck pain, worse on  the right side. Her MRI of C-spine was reviewed with patient and daughter. Patient with multilevel foraminal stenosis, as well as multilevel cervical facet arthropathy. Would recommend cervical medial branch blocks. We'll schedule  2. Chronic hip pain with osteoarthritis, has had good relief with left hip injection under fluoroscopic guidance. Would recommend repeat  3. Osteoarthritis. Multifocal including bilateral hands. Maybe benefit from Voltaren gel  . 4. Chronic pain syndrome on chronic opioids. No history of misuse. We'll need UDS. On low dose currently. If UDS is appropriate. Would continue oxycodone 5 mg twice a day. We'll need monthly visits

## 2016-05-29 NOTE — Patient Instructions (Signed)
We'll check urine drug screen once results come back within oxycodone 5 mg twice a day  Set up for hip injection next visit, left hip under fluoroscopic guidance. In the future, we may have to do neck injection

## 2016-06-06 LAB — TOXASSURE SELECT,+ANTIDEPR,UR

## 2016-06-10 ENCOUNTER — Ambulatory Visit: Payer: Self-pay | Admitting: Podiatry

## 2016-06-11 ENCOUNTER — Ambulatory Visit (HOSPITAL_BASED_OUTPATIENT_CLINIC_OR_DEPARTMENT_OTHER): Payer: Medicare Other | Admitting: Physical Medicine & Rehabilitation

## 2016-06-11 VITALS — BP 124/77 | HR 82

## 2016-06-11 DIAGNOSIS — M25561 Pain in right knee: Secondary | ICD-10-CM | POA: Diagnosis not present

## 2016-06-11 DIAGNOSIS — M25562 Pain in left knee: Secondary | ICD-10-CM | POA: Diagnosis not present

## 2016-06-11 DIAGNOSIS — M47812 Spondylosis without myelopathy or radiculopathy, cervical region: Secondary | ICD-10-CM

## 2016-06-11 DIAGNOSIS — M4802 Spinal stenosis, cervical region: Secondary | ICD-10-CM | POA: Diagnosis not present

## 2016-06-11 DIAGNOSIS — M19041 Primary osteoarthritis, right hand: Secondary | ICD-10-CM | POA: Diagnosis not present

## 2016-06-11 DIAGNOSIS — M25552 Pain in left hip: Secondary | ICD-10-CM | POA: Diagnosis not present

## 2016-06-11 DIAGNOSIS — G8929 Other chronic pain: Secondary | ICD-10-CM | POA: Diagnosis not present

## 2016-06-11 DIAGNOSIS — M542 Cervicalgia: Secondary | ICD-10-CM | POA: Diagnosis not present

## 2016-06-11 DIAGNOSIS — M1610 Unilateral primary osteoarthritis, unspecified hip: Secondary | ICD-10-CM | POA: Diagnosis not present

## 2016-06-11 DIAGNOSIS — M549 Dorsalgia, unspecified: Secondary | ICD-10-CM | POA: Diagnosis not present

## 2016-06-11 DIAGNOSIS — M19042 Primary osteoarthritis, left hand: Secondary | ICD-10-CM | POA: Diagnosis not present

## 2016-06-11 DIAGNOSIS — M25551 Pain in right hip: Secondary | ICD-10-CM | POA: Diagnosis not present

## 2016-06-11 DIAGNOSIS — M25511 Pain in right shoulder: Secondary | ICD-10-CM | POA: Diagnosis not present

## 2016-06-11 MED ORDER — OXYCODONE HCL 5 MG PO TABS: 5.0000 mg | ORAL_TABLET | Freq: Two times a day (BID) | ORAL | 0 refills | Status: DC

## 2016-06-11 NOTE — Progress Notes (Addendum)
  PROCEDURE RECORD [facility]   Name: Haley Harris DOB:[birth year] MRN: [id number]  Date:06/11/2016  Physician: Alysia Penna, MD    Nurse Shumaker RN /: Simon Rhein CMA  Allergies:  Allergies  Allergen Reactions  . Amoxicillin-Pot Clavulanate Anaphylaxis  . Penicillins Anaphylaxis  . Cephalexin Other (See Comments)    Reaction unknown  . Clarithromycin Other (See Comments)    Reaction unknown  . Lidocaine     Patient had tremors following use of lidocaine pain patches  . Nifedipine Other (See Comments)    Reaction unknown  . Nsaids Other (See Comments)    Heart issue  . Olmesartan Medoxomil     REACTION: cough;  But tolerating Losartan 02/2014  . Tramadol Hcl Other (See Comments)    Reaction unknown    Consent Signed: Yes.    Is patient diabetic? No.  CBG today? no Pregnant: No. LMP: No LMP recorded. Patient has had a hysterectomy. (age 14-55)  Anticoagulants: no Anti-inflammatory: no Antibiotics: no  Procedure: cervical 4-5-6 medial branch block  Position: Right Lateral Start Time:132pm    End Time:143pm  Fluoro Time:27s   RN/CMA Jala Dundon CMA Shumaker RN    Time 121pm 150pm    BP 124/77 112/69    Pulse 82 78    Respirations 16 16    O2 Sat 94 91    S/S 6 6    Pain Level 7 0     D/C home with daughter, patient A & O X 3, D/C instructions reviewed, and sits independently.

## 2016-06-11 NOTE — Progress Notes (Signed)
Left C4-5 Cervical medial branch blocks under fluoroscopic guidance  Indication:Left  Cervical axial pain without radiculitis. Pain is unresponsive to conservative care and interferes with activities of daily living.  Informed consent was obtained after describing risks and benefits of the procedure with the patient this includes bleeding bruising and infection temporary or permanent paralysis. Patient elected to proceed and has given written consent. Patient placed in a right lateral decubitus position. Fluoroscopic was suggested to insure adequate view of the articular pillars in a lateral view. Area was marked and prepped with Betadine. 1/2 cc of 1% lidocaine injected into the skin and subcutaneous tissue using a 27-gauge 0.5 inch needle. Then a 25-gauge 1.5 inch needle was inserted under fluoroscopic guidance first targeting the LEFT C4 articular pillar center. Bone contact made and confirmed under AP imaging. Then 0.5 mL of 1% MPF lidocaine injected. This same procedure was repeated at C5  using same technique. Patient tolerated the procedure well. Post procedure instructions given. Adequate postoperative monitoring was performed. See flowsheet

## 2016-06-11 NOTE — Patient Instructions (Addendum)
Cervical medial branch blocks were performed on the left side today. This is to help with arthritis type pain. As we discussed, you also have degenerative disc.  Follow-up in 1 month

## 2016-06-12 ENCOUNTER — Ambulatory Visit: Payer: Self-pay | Admitting: Podiatry

## 2016-06-12 DIAGNOSIS — I119 Hypertensive heart disease without heart failure: Secondary | ICD-10-CM | POA: Diagnosis not present

## 2016-06-12 DIAGNOSIS — D649 Anemia, unspecified: Secondary | ICD-10-CM | POA: Diagnosis not present

## 2016-06-12 DIAGNOSIS — I2510 Atherosclerotic heart disease of native coronary artery without angina pectoris: Secondary | ICD-10-CM | POA: Diagnosis not present

## 2016-06-12 DIAGNOSIS — R0609 Other forms of dyspnea: Secondary | ICD-10-CM | POA: Diagnosis not present

## 2016-06-12 DIAGNOSIS — R002 Palpitations: Secondary | ICD-10-CM | POA: Diagnosis not present

## 2016-06-16 ENCOUNTER — Ambulatory Visit (INDEPENDENT_AMBULATORY_CARE_PROVIDER_SITE_OTHER): Payer: Medicare Other | Admitting: Podiatry

## 2016-06-16 ENCOUNTER — Encounter: Payer: Self-pay | Admitting: Podiatry

## 2016-06-16 VITALS — BP 101/80 | HR 82

## 2016-06-16 DIAGNOSIS — L608 Other nail disorders: Secondary | ICD-10-CM

## 2016-06-16 DIAGNOSIS — B351 Tinea unguium: Secondary | ICD-10-CM | POA: Diagnosis not present

## 2016-06-16 DIAGNOSIS — L603 Nail dystrophy: Secondary | ICD-10-CM

## 2016-06-16 DIAGNOSIS — M79609 Pain in unspecified limb: Secondary | ICD-10-CM | POA: Diagnosis not present

## 2016-06-16 NOTE — Progress Notes (Signed)
SUBJECTIVE Patient  presents to office today complaining of elongated, thickened nails. Pain while ambulating in shoes. Patient is unable to trim their own nails.   OBJECTIVE General Patient is awake, alert, and oriented x 3 and in no acute distress. Derm Skin is dry and supple bilateral. Negative open lesions or macerations. Remaining integument unremarkable. Nails are tender, long, thickened and dystrophic with subungual debris, consistent with onychomycosis, 1-5 bilateral. No signs of infection noted. Vasc  DP and PT pedal pulses palpable bilaterally. Temperature gradient within normal limits.  Neuro Epicritic and protective threshold sensation diminished bilaterally.  Musculoskeletal Exam No symptomatic pedal deformities noted bilateral. Muscular strength within normal limits.  ASSESSMENT 1. Onychodystrophic nails 1-5 bilateral with hyperkeratosis of nails.  2. Onychomycosis of nail due to dermatophyte bilateral 3. Pain in foot bilateral  PLAN OF CARE 1. Patient evaluated today.  2. Instructed to maintain good pedal hygiene and foot care.  3. Mechanical debridement of nails 1-5 bilaterally performed using a nail nipper. Filed with dremel without incident.  4. Return to clinic in 3 mos.    Brent M Evans, DPM    

## 2016-07-08 ENCOUNTER — Encounter: Payer: Medicare Other | Attending: Physical Medicine & Rehabilitation | Admitting: *Deleted

## 2016-07-08 ENCOUNTER — Encounter (HOSPITAL_BASED_OUTPATIENT_CLINIC_OR_DEPARTMENT_OTHER): Payer: Medicare Other | Admitting: *Deleted

## 2016-07-08 VITALS — BP 122/79 | HR 79 | Resp 16

## 2016-07-08 DIAGNOSIS — M25552 Pain in left hip: Secondary | ICD-10-CM | POA: Insufficient documentation

## 2016-07-08 DIAGNOSIS — M542 Cervicalgia: Secondary | ICD-10-CM | POA: Diagnosis not present

## 2016-07-08 DIAGNOSIS — M19042 Primary osteoarthritis, left hand: Secondary | ICD-10-CM | POA: Insufficient documentation

## 2016-07-08 DIAGNOSIS — M25561 Pain in right knee: Secondary | ICD-10-CM | POA: Insufficient documentation

## 2016-07-08 DIAGNOSIS — G8929 Other chronic pain: Secondary | ICD-10-CM | POA: Insufficient documentation

## 2016-07-08 DIAGNOSIS — M19041 Primary osteoarthritis, right hand: Secondary | ICD-10-CM | POA: Diagnosis not present

## 2016-07-08 DIAGNOSIS — M47812 Spondylosis without myelopathy or radiculopathy, cervical region: Secondary | ICD-10-CM

## 2016-07-08 DIAGNOSIS — M25511 Pain in right shoulder: Secondary | ICD-10-CM | POA: Diagnosis not present

## 2016-07-08 DIAGNOSIS — M4802 Spinal stenosis, cervical region: Secondary | ICD-10-CM

## 2016-07-08 DIAGNOSIS — M25551 Pain in right hip: Secondary | ICD-10-CM | POA: Diagnosis not present

## 2016-07-08 DIAGNOSIS — M1610 Unilateral primary osteoarthritis, unspecified hip: Secondary | ICD-10-CM | POA: Diagnosis not present

## 2016-07-08 DIAGNOSIS — M25562 Pain in left knee: Secondary | ICD-10-CM | POA: Diagnosis not present

## 2016-07-08 DIAGNOSIS — Z79899 Other long term (current) drug therapy: Secondary | ICD-10-CM

## 2016-07-08 DIAGNOSIS — G894 Chronic pain syndrome: Secondary | ICD-10-CM

## 2016-07-08 DIAGNOSIS — M549 Dorsalgia, unspecified: Secondary | ICD-10-CM | POA: Diagnosis not present

## 2016-07-08 DIAGNOSIS — Z5181 Encounter for therapeutic drug level monitoring: Secondary | ICD-10-CM

## 2016-07-08 MED ORDER — OXYCODONE HCL 5 MG PO TABS: 5.0000 mg | ORAL_TABLET | Freq: Two times a day (BID) | ORAL | 0 refills | Status: DC

## 2016-07-08 NOTE — Progress Notes (Signed)
Follow up visit with RN for med refill. Oxycodone 5 mg # 60 filled 06/11/16.  Today #15 tablets.  [place] reviewed and appropriate. She has not fallen or traveled outside of the [place] in past 21 days.  Her neck pain continues with a pain score of 8, described as sharp stabbing and aching.  Her medication list reviewed and no changes to her medications with the exception of removing old oxycodone/acetaminophen 5/325 order which she no longer uses from previous provider. Her main concerns today is the fear of alcohol in her urine test. Her church serves communion every week and she was afraid to partake in the ceremony for fear of having another + uds.  She also occasionally drinks socially.  I have explained to her the reasons for asking patients not to combine alcohol and narcotics due to increase risk of falls as well as potential overdose in some patients.  She understands.  I have encouraged her to speak with Dr Letta Pate about this at her next appointment.  I have given her a one month supply refill on her oxycodone 5 mg tablets #60 and she will return to see Dr Letta Pate in one month.

## 2016-07-08 NOTE — Progress Notes (Signed)
Follow up visit with RN for med refill. Oxycodone 5 mg # 60 filled 06/11/16.  Today #15 tablets.  [place] reviewed and appropriate. She has not fallen or traveled outside of the [place] in past 21 days.  Her neck pain continues with a pain score of 8, described as sharp stabbing and aching.  Her medication list reviewed and no changes to her medications with the exception of removing old oxycodone/acetaminophen 5/325 order which she no longer uses from previous provider. Her main concerns today is the fear of alcohol in her urine test. Her church serves communion every week and she was afraid to partake in the ceremony for fear of having another + uds.  She also occasionally drinks socially.  I have explained to her the reasons for asking patients not to combine alcohol and narcotics due to increase risk of falls as well as potential overdose in some patients.  She understands.  I have encouraged her to speak with Dr Letta Pate about this at her next appointment.  I have given her a one month supply refill on her oxycodone 5 mg tablets #60 and she will return to see Dr Letta Pate in one month.

## 2016-07-11 IMAGING — CT CT CHEST W/O CM
2 of 3 series · 14 of 36 positions shown, 17 images · non-contrast
Comparison: 01/19/2015

CLINICAL DATA: Fever and productive cough. Isolated left upper lobe
wheezing. Evaluate for pneumonia

EXAM:
CT CHEST WITHOUT CONTRAST
TECHNIQUE: Multidetector CT imaging of the chest was performed following the
standard protocol without IV contrast.

[Series 201: chest without, idose (2) · axial · non-contrast · 0.68mm/px · z∈[+106,+376]mm · 11 of 64 slices shown, 14 images]
[im 5/64  mediastinal]
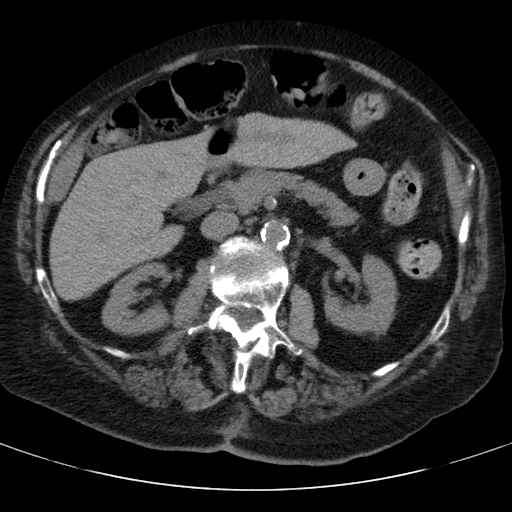
[im 5/64  lung]
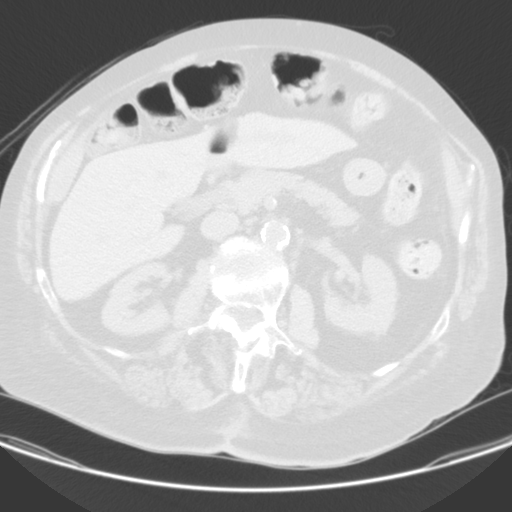
[im 10/64  lung]
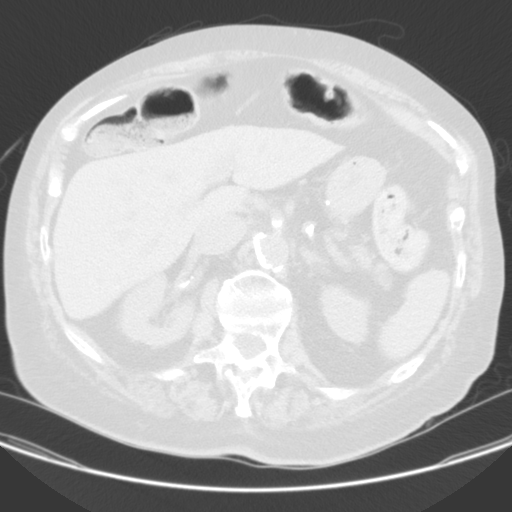
[im 15/64  lung]
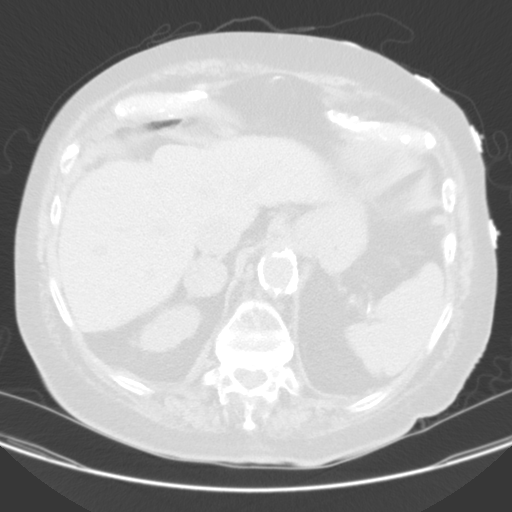
[im 22/64  lung]
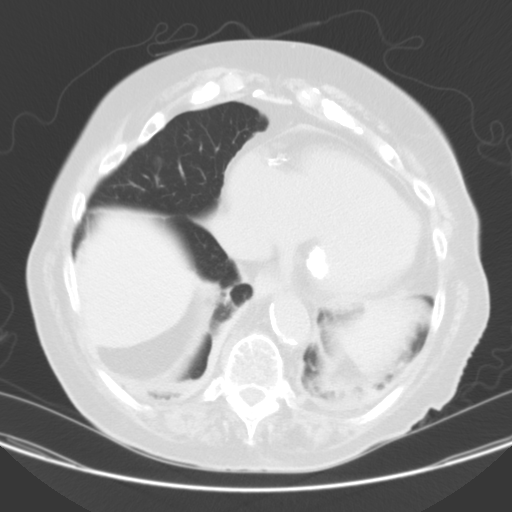
[im 26/64  mediastinal]
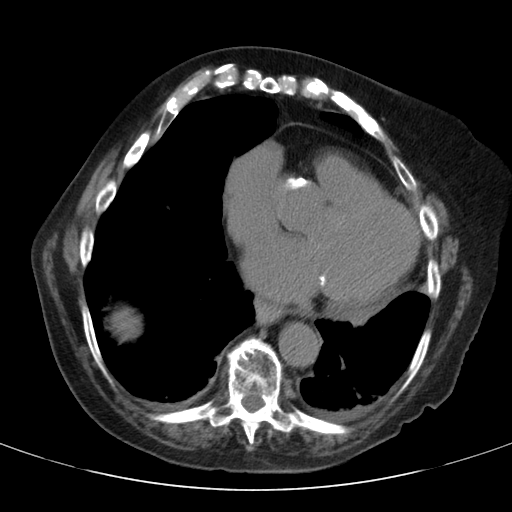
[im 26/64  lung]
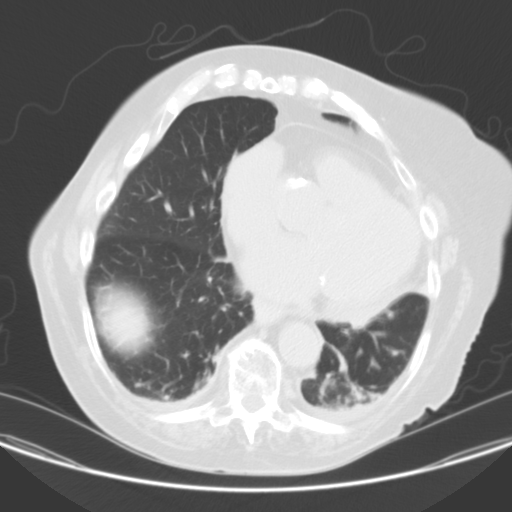
[im 33/64  lung]
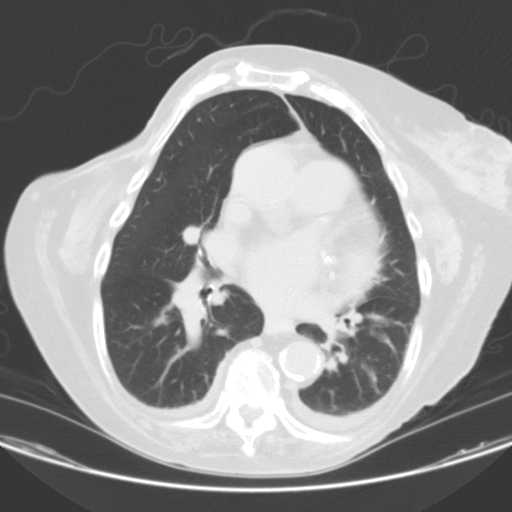
[im 38/64  lung]
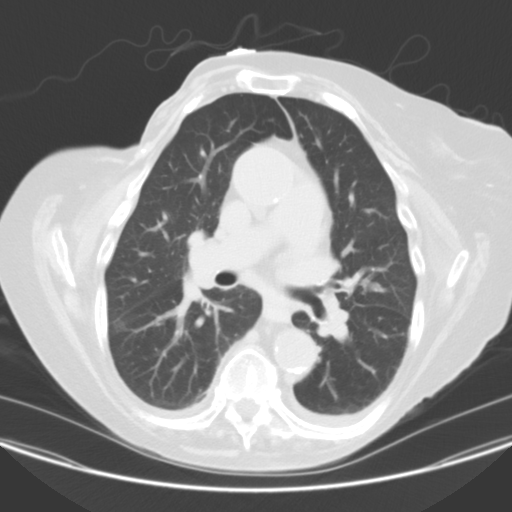
[im 43/64  lung]
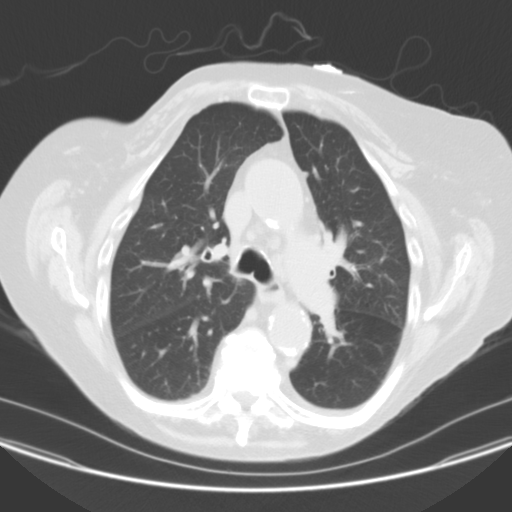
[im 50/64  mediastinal]
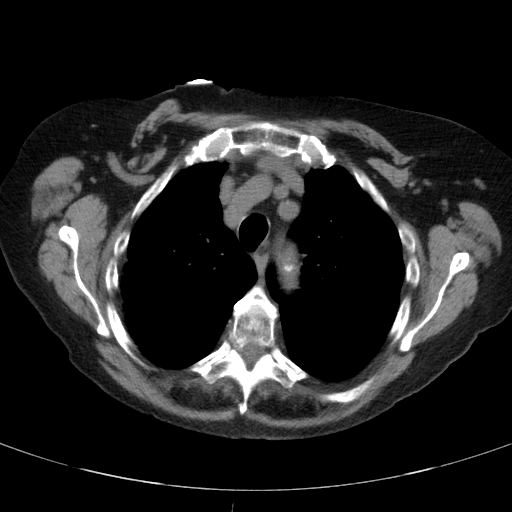
[im 50/64  lung]
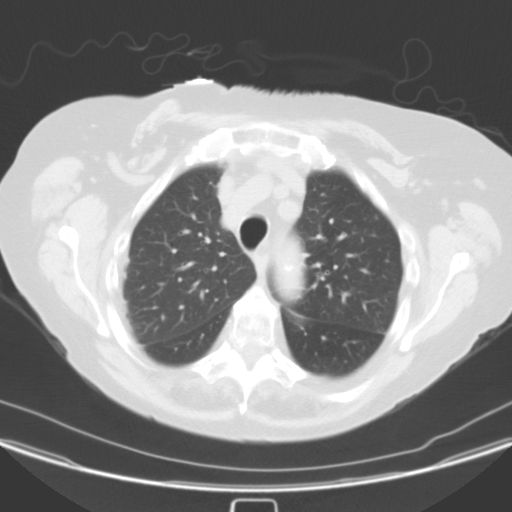
[im 54/64  lung]
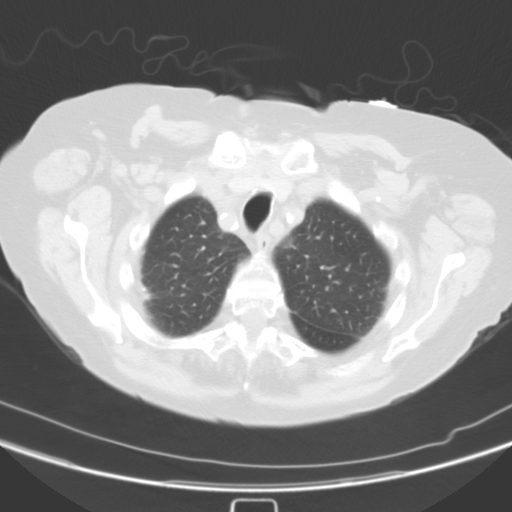
[im 59/64  lung]
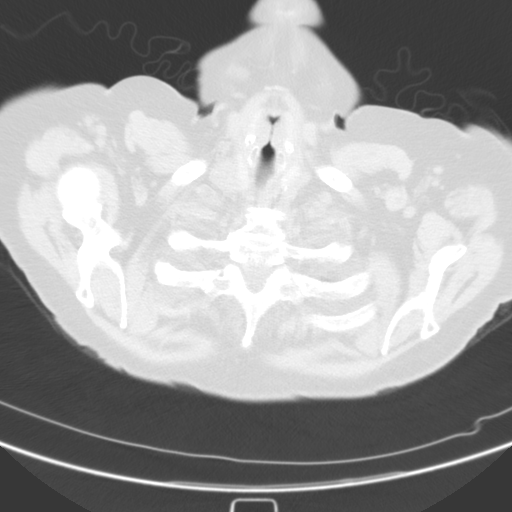

[Series 203: coronal, idose (2) · coronal · 0.45mm/px · 3 of 112 slices shown]
[im 23/112  lung]
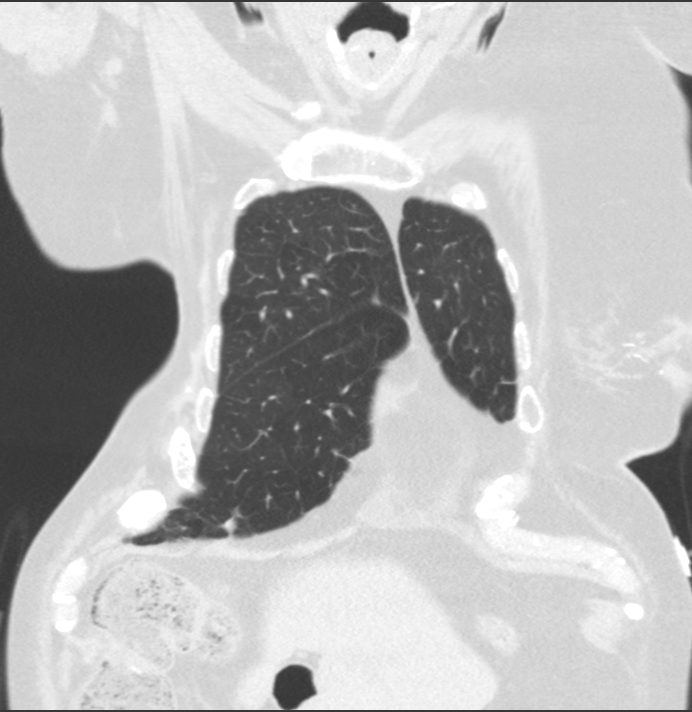
[im 45/112  lung]
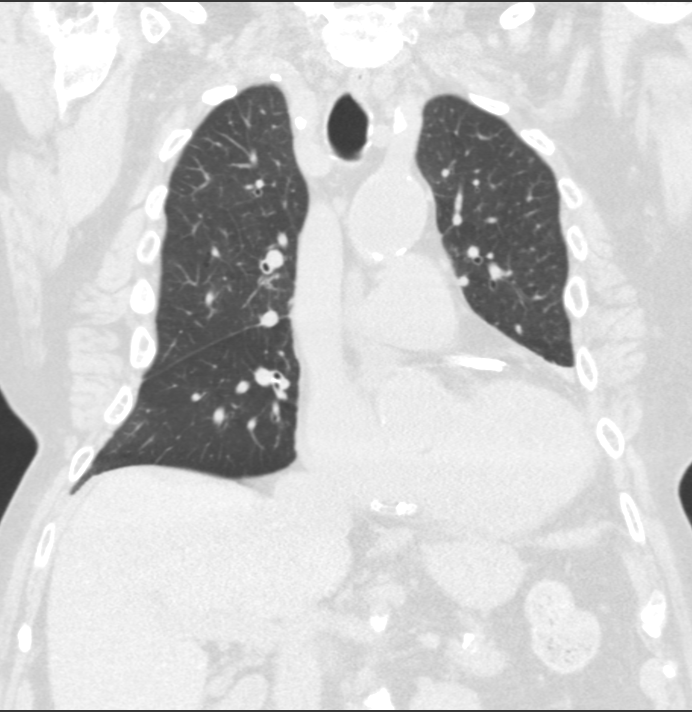
[im 67/112  lung]
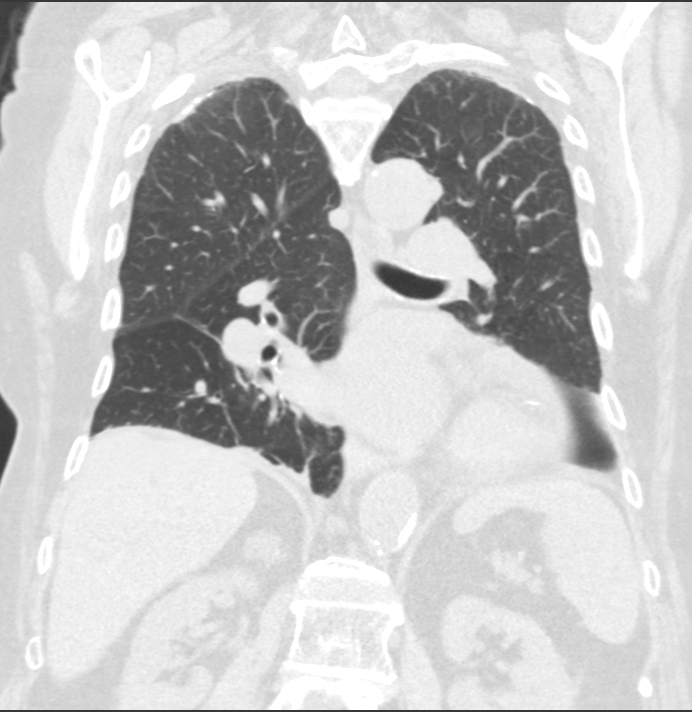

[14 of 36 positions shown; findings below may reference images not displayed]

FINDINGS: THORACIC INLET/BODY WALL:

Right mastectomy.

MEDIASTINUM:

Chronic cardiomegaly with trace pericardial fluid or thickening. No
evidence of acute vascular disease. Diffuse atherosclerosis,
including the coronary arteries. No adenopathy. Negative esophagus.

LUNG WINDOWS:

Mild dependent atelectasis or scar. Trace pleural effusions. Lungs
have a stable appearance since 1826. There is no edema,
consolidation, effusion, or pneumothorax.

UPPER ABDOMEN:

Chronic 3 cm right adrenal mass, benign given stability and
attributed to adenoma. Benign hepatic low densities which are small.

OSSEOUS:

Remote compression fractures at T1, T6, T8, and T12. The T6 and T12
fractures have advanced height loss with vertebra plana. No acute
osseous finding.
IMPRESSION: 1. Negative for pneumonia.  Stable exam compared to January 2015.
2. Chronic basilar atelectasis/ scarring and trace effusions.

## 2016-07-22 ENCOUNTER — Encounter: Payer: Self-pay | Admitting: Internal Medicine

## 2016-07-22 ENCOUNTER — Ambulatory Visit (INDEPENDENT_AMBULATORY_CARE_PROVIDER_SITE_OTHER): Payer: Medicare Other | Admitting: Internal Medicine

## 2016-07-22 VITALS — BP 138/86 | HR 89 | Temp 97.4°F | Resp 18 | Wt 180.0 lb

## 2016-07-22 DIAGNOSIS — J0140 Acute pansinusitis, unspecified: Secondary | ICD-10-CM

## 2016-07-22 DIAGNOSIS — J441 Chronic obstructive pulmonary disease with (acute) exacerbation: Secondary | ICD-10-CM

## 2016-07-22 DIAGNOSIS — I5043 Acute on chronic combined systolic (congestive) and diastolic (congestive) heart failure: Secondary | ICD-10-CM | POA: Diagnosis not present

## 2016-07-22 MED ORDER — FUROSEMIDE 40 MG PO TABS: 40.0000 mg | ORAL_TABLET | Freq: Two times a day (BID) | ORAL | 3 refills | Status: DC

## 2016-07-22 MED ORDER — DOXYCYCLINE HYCLATE 100 MG PO TABS: 100.0000 mg | ORAL_TABLET | Freq: Two times a day (BID) | ORAL | 0 refills | Status: DC

## 2016-07-22 MED ORDER — PREDNISONE 20 MG PO TABS
40.0000 mg | ORAL_TABLET | Freq: Every day | ORAL | 0 refills | Status: DC
Start: 2016-07-22 — End: 2016-09-07

## 2016-07-22 NOTE — Assessment & Plan Note (Signed)
Mild  Will try 3 days of prednisone

## 2016-07-22 NOTE — Patient Instructions (Signed)
Please take 80mg  (2 of the 40mg ) of the furosemide---twice a day--until your weight is back down to your normal. Then you can resume your regular dosing.

## 2016-07-22 NOTE — Progress Notes (Signed)
Pre visit review using our clinic review tool, if applicable. No additional management support is needed unless otherwise documented below in the visit note. 

## 2016-07-22 NOTE — Assessment & Plan Note (Signed)
Fluid overload due to dietary salt Will increase the furosemide till her weight is down

## 2016-07-22 NOTE — Assessment & Plan Note (Signed)
Will try doxy (given her allergies) Back to levaquin if doesn't respond

## 2016-07-22 NOTE — Progress Notes (Signed)
Subjective:    Patient ID: Haley Harris, female    DOB: 1928-11-23, 83 y.o.   MRN: [id number]  HPI Here due to respiratory illness With son  Started with head congestion around 2 weeks ago Worsening cough--has to sleep in recliner Now has "thickened up"---thick yellow sputum now Still with bad head congestion--but also in chest Chest hurts from the cough and bad headache  No fever Some sweats and chills at night Breathing is some labored---but better with the regular nebulizer  Did try some afrin briefly Also used Vick's  Having high salt foods--daughter who lives with her had THR People bringing food Increased edema---has increased furosemide to bid  Current Outpatient Prescriptions on File Prior to Visit  Medication Sig Dispense Refill  . acetaminophen (TYLENOL) 325 MG tablet Take 2 tablets (650 mg total) by mouth every 4 (four) hours as needed for headache or mild pain.    Marland Kitchen albuterol (PROVENTIL) (2.5 MG/3ML) 0.083% nebulizer solution Take 3 mLs (2.5 mg total) by nebulization every 6 (six) hours as needed for wheezing or shortness of breath. 540 mL 3  . aspirin 81 MG tablet Take 81 mg by mouth daily after lunch.     . Calcium Carbonate-Vitamin D (CALTRATE 600+D PO) Take 1 tablet by mouth daily after lunch.     . carvedilol (COREG) 12.5 MG tablet Take 1 tablet by mouth two  times daily with a meal 180 tablet 0  . denosumab (PROLIA) 60 MG/ML SOLN injection Inject 60 mg into the skin every 6 (six) months. Reported on 11/05/2015    . escitalopram (LEXAPRO) 10 MG tablet Take 10 mg by mouth daily.    . famotidine (PEPCID) 20 MG tablet Take 1 tablet (20 mg total) by mouth 2 (two) times daily. 180 tablet 3  . fexofenadine (ALLEGRA) 180 MG tablet Take 180 mg by mouth at bedtime.    . fluticasone (FLONASE) 50 MCG/ACT nasal spray Use 2 sprays in each  nostril daily 48 g 3  . furosemide (LASIX) 40 MG tablet TAKE 1 TABLET BY MOUTH  DAILY 90 tablet 0  . gabapentin (NEURONTIN) 100 MG  capsule Take 1 capsule (100 mg total) by mouth 2 (two) times daily. 180 capsule 3  . levocetirizine (XYZAL) 5 MG tablet Take 5 mg by mouth every evening.    Marland Kitchen losartan (COZAAR) 50 MG tablet Take 1 tablet by mouth  daily 90 tablet 1  . montelukast (SINGULAIR) 10 MG tablet TAKE 1 TABLET BY MOUTH DAILY 90 tablet 3  . Multiple Vitamin (MULITIVITAMIN WITH MINERALS) TABS Take 1 tablet by mouth daily after lunch.     . oxyCODONE (OXY IR/ROXICODONE) 5 MG immediate release tablet Take 1 tablet (5 mg total) by mouth 2 (two) times daily. 60 tablet 0  . pravastatin (PRAVACHOL) 80 MG tablet Take 1 tablet by mouth  daily 90 tablet 3  . PROAIR HFA 108 (90 Base) MCG/ACT inhaler USE 2 PUFFS EVERY 6 HOURS AS NEEDED FOR WHEEZING OR SHORTNESS OF BREATH 8.5 g 5  . spironolactone (ALDACTONE) 25 MG tablet Take 1 tablet by mouth  daily 90 tablet 3  . vitamin B-12 (CYANOCOBALAMIN) 500 MCG tablet Take 1,000 mcg by mouth 3 (three) times a week. Mon, Wed, Fri     No current facility-administered medications on file prior to visit.     Allergies  Allergen Reactions  . Amoxicillin-Pot Clavulanate Anaphylaxis  . Penicillins Anaphylaxis  . Cephalexin Other (See Comments)    Reaction unknown  .  Clarithromycin Other (See Comments)    Reaction unknown  . Lidocaine     Patient had tremors following use of lidocaine pain patches  . Nifedipine Other (See Comments)    Reaction unknown  . Nsaids Other (See Comments)    Heart issue  . Olmesartan Medoxomil     REACTION: cough;  But tolerating Losartan 02/2014  . Tramadol Hcl Other (See Comments)    Reaction unknown    Past Medical History:  Diagnosis Date  . ACE-inhibitor cough   . Allergic rhinitis, cause unspecified   . Angina   . Anxiety   . CHF (congestive heart failure) ([place])   . Chronic airway obstruction, not elsewhere classified   . Compression fracture of T12 vertebra ([place]) 10/28/2011  . Depressive disorder, not elsewhere classified   . Esophageal reflux     . Heart murmur    aS CHILD  . Malignant neoplasm of breast (female), unspecified site   . Myocardial infarct   . 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 Occlusion and stenosis of carotid artery without mention of cerebral infarction   . Osteoarthrosis, unspecified whether generalized or localized, unspecified site   . Osteoporosis, unspecified   . Other and unspecified hyperlipidemia   . Other dyspnea and respiratory abnormality   . Peripheral vascular disease, unspecified   . Personal history of malignant neoplasm of breast   . Pneumonia   . Recurrent upper respiratory infection (URI)   . Shortness of breath   . Spinal stenosis   . Unspecified cardiovascular disease   . Unspecified essential hypertension   . Unspecified hearing loss   . Unspecified urinary incontinence     Past Surgical History:  Procedure Laterality Date  . ABDOMINAL HYSTERECTOMY    . APPENDECTOMY  1948  . BREAST IMPLANT REMOVAL  06/12/09   right  . BREAST RECONSTRUCTION  1998   Reconstruction   . CARDIAC CATHETERIZATION N/A 01/25/2015   Procedure: Left Heart Cath and Coronary Angiography;  Surgeon: Charolette Forward, MD;  Location: [place];  Service: Cardiovascular;  Laterality: N/A;  . CORONARY ANGIOPLASTY  11/12   distal RCA  . FLEXIBLE SIGMOIDOSCOPY N/A 01/21/2015   Procedure: FLEXIBLE SIGMOIDOSCOPY;  Surgeon: Richmond Campbell, MD;  Location: [facility] ENDOSCOPY;  Service: Endoscopy;  Laterality: N/A;  . FLEXIBLE SIGMOIDOSCOPY  01/22/2015      . FRACTURE SURGERY     fracture right elbow  . LEFT HEART CATHETERIZATION WITH CORONARY ANGIOGRAM N/A 06/05/2011   Procedure: LEFT HEART CATHETERIZATION WITH CORONARY ANGIOGRAM;  Surgeon: Clent Demark, MD;  Location: [facility] LAB;  Service: Cardiovascular;  Laterality: N/A;  . LEFT HEART CATHETERIZATION WITH CORONARY ANGIOGRAM N/A 03/05/2014   Procedure: LEFT HEART CATHETERIZATION WITH CORONARY ANGIOGRAM;  Surgeon: Clent Demark, MD;   Location: [place] CATH LAB;  Service: Cardiovascular;  Laterality: N/A;  . LUMBAR LAMINECTOMY  2003  . MASTECTOMY  1987   Right  . MASTECTOMY    . MASTOIDECTOMY  childhood  . REDUCTION MAMMAPLASTY  1998   Left  . SHOULDER SURGERY  02/2005   Bilateral fractures with multiple surgeries    Family History  Problem Relation Age of Onset  . Heart attack Father 91  . Cancer Mother     ovarian  . Cancer Brother     lung  . Arthritis      family    Social History   Social History  . Marital status: Widowed    Spouse name:  N/A  . Number of children: 2  . Years of education: N/A   Occupational History  . 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  [employer]   Social History Main Topics  . Smoking status: Former Smoker    Quit date: 07/13/1974  . Smokeless tobacco: Never Used  . Alcohol use 0.0 oz/week     Comment: occasional  . Drug use: No  . Sexual activity: No   Other Topics Concern  . Not on file   Social History Narrative   Has living will   Son or daughter would be health care POA.   Requests DNR--written 03/02/13   No tube feeds if cognitively unaware   Review of Systems Also using humidifier No rash No vomiting or diarrhea Appetite is off     Objective:   Physical Exam  Constitutional: She appears well-nourished. No distress.  HENT:  Mouth/Throat: Oropharynx is clear and moist. No oropharyngeal exudate.  Frontal>maxillary tenderness Moderate nasal inflammation--green mucus (esp left)  Neck: No JVD present. No thyromegaly present.  Cardiovascular: Normal rate, regular rhythm and normal heart sounds.   No murmur heard. Pulmonary/Chest:  Slight decreased breath sounds, expiratory rhonchi Not overly tight  Musculoskeletal:  1+ edema  Lymphadenopathy:    She has no cervical adenopathy.          Assessment & Plan:

## 2016-07-31 ENCOUNTER — Encounter: Payer: Medicare Other | Attending: Physical Medicine & Rehabilitation

## 2016-07-31 ENCOUNTER — Encounter: Payer: Self-pay | Admitting: Physical Medicine & Rehabilitation

## 2016-07-31 ENCOUNTER — Ambulatory Visit (HOSPITAL_BASED_OUTPATIENT_CLINIC_OR_DEPARTMENT_OTHER): Payer: Medicare Other | Admitting: Physical Medicine & Rehabilitation

## 2016-07-31 VITALS — BP 95/53 | HR 81 | Resp 14

## 2016-07-31 DIAGNOSIS — M25562 Pain in left knee: Secondary | ICD-10-CM | POA: Insufficient documentation

## 2016-07-31 DIAGNOSIS — M1610 Unilateral primary osteoarthritis, unspecified hip: Secondary | ICD-10-CM | POA: Diagnosis not present

## 2016-07-31 DIAGNOSIS — M25561 Pain in right knee: Secondary | ICD-10-CM | POA: Insufficient documentation

## 2016-07-31 DIAGNOSIS — M180 Bilateral primary osteoarthritis of first carpometacarpal joints: Secondary | ICD-10-CM | POA: Diagnosis not present

## 2016-07-31 DIAGNOSIS — G8929 Other chronic pain: Secondary | ICD-10-CM | POA: Insufficient documentation

## 2016-07-31 DIAGNOSIS — M19042 Primary osteoarthritis, left hand: Secondary | ICD-10-CM | POA: Insufficient documentation

## 2016-07-31 DIAGNOSIS — M19041 Primary osteoarthritis, right hand: Secondary | ICD-10-CM | POA: Insufficient documentation

## 2016-07-31 DIAGNOSIS — M549 Dorsalgia, unspecified: Secondary | ICD-10-CM | POA: Insufficient documentation

## 2016-07-31 DIAGNOSIS — M47812 Spondylosis without myelopathy or radiculopathy, cervical region: Secondary | ICD-10-CM | POA: Insufficient documentation

## 2016-07-31 DIAGNOSIS — M25511 Pain in right shoulder: Secondary | ICD-10-CM | POA: Insufficient documentation

## 2016-07-31 DIAGNOSIS — M25552 Pain in left hip: Secondary | ICD-10-CM | POA: Insufficient documentation

## 2016-07-31 DIAGNOSIS — M542 Cervicalgia: Secondary | ICD-10-CM | POA: Diagnosis not present

## 2016-07-31 DIAGNOSIS — M4802 Spinal stenosis, cervical region: Secondary | ICD-10-CM | POA: Diagnosis not present

## 2016-07-31 DIAGNOSIS — M25551 Pain in right hip: Secondary | ICD-10-CM | POA: Insufficient documentation

## 2016-07-31 MED ORDER — OXYCODONE HCL 5 MG PO TABS: 5.0000 mg | ORAL_TABLET | Freq: Two times a day (BID) | ORAL | 0 refills | Status: DC

## 2016-07-31 NOTE — Patient Instructions (Signed)
Repeat Left neck injections next month

## 2016-07-31 NOTE — Progress Notes (Signed)
Subjective:    Patient ID: Haley Harris, female    DOB: August 18, 1928, 83 y.o.   MRN: [id number] 06/11/16 Left C4-5 Cervical medial branch blocks under fluoroscopic guidance 100% relief for 1 wk HPI Interval hx fluid overload treated with Lasix Also had exacerbation of COPD placed on prednisone for 3 days by primary care physician Daughter has joint replacement staying with pt Discussed alcohol intake. Discussed wine with communion, which should be okay given small amount. Also, we discussed foods that have wine based sauces, which also should be okay, given that the alcohol would evaporated during cooking. No numbness in UE, neck pain, left greater than right continues to be her main chronic pain complaint. No headache or occipital pain. She does have chronic numbness in both feet. Base of thumb pain with knitting Pain Inventory Average Pain 7 Pain Right Now 7 My pain is sharp, stabbing and aching  In the last 24 hours, has pain interfered with the following? General activity 5 Relation with others 5 Enjoyment of life 5 What TIME of day is your pain at its worst? All Sleep (in general) Fair  Pain is worse with: walking, bending, standing and some activites Pain improves with: rest, heat/ice, therapy/exercise and medication Relief from Meds: 8  Mobility walk without assistance walk with assistance use a walker how many minutes can you walk? 2-3 ability to climb steps?  yes do you drive?  no Do you have any goals in this area?  yes  Function retired  Neuro/Psych bladder control problems trouble walking  Prior Studies Any changes since last visit?  yes  Physicians involved in your care Any changes since last visit?  yes Primary care n/a   Family History  Problem Relation Age of Onset  . Heart attack Father 60  . Cancer Mother     ovarian  . Cancer Brother     lung  . Arthritis      family   Social History   Social History  . Marital status: Widowed   Spouse name: N/A  . Number of children: 2  . Years of education: N/A   Occupational History  . 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  [employer]   Social History Main Topics  . Smoking status: Former Smoker    Quit date: 07/13/1974  . Smokeless tobacco: Never Used  . Alcohol use 0.0 oz/week     Comment: occasional  . Drug use: No  . Sexual activity: No   Other Topics Concern  . None   Social History Narrative   Has living will   Son or daughter would be health care POA.   Requests DNR--written 03/02/13   No tube feeds if cognitively unaware   Past Surgical History:  Procedure Laterality Date  . ABDOMINAL HYSTERECTOMY    . APPENDECTOMY  1948  . BREAST IMPLANT REMOVAL  06/12/09   right  . BREAST RECONSTRUCTION  1998   Reconstruction   . CARDIAC CATHETERIZATION N/A 01/25/2015   Procedure: Left Heart Cath and Coronary Angiography;  Surgeon: Charolette Forward, MD;  Location: [facility] LAB;  Service: Cardiovascular;  Laterality: N/A;  . CORONARY ANGIOPLASTY  11/12   distal RCA  . FLEXIBLE SIGMOIDOSCOPY N/A 01/21/2015   Procedure: FLEXIBLE SIGMOIDOSCOPY;  Surgeon: Richmond Campbell, MD;  Location: [facility] ENDOSCOPY;  Service: Endoscopy;  Laterality: N/A;  . FLEXIBLE SIGMOIDOSCOPY  01/22/2015      . FRACTURE SURGERY     fracture right  elbow  . LEFT HEART CATHETERIZATION WITH CORONARY ANGIOGRAM N/A 06/05/2011   Procedure: LEFT HEART CATHETERIZATION WITH CORONARY ANGIOGRAM;  Surgeon: Clent Demark, MD;  Location: [facility] [facility] CATH LAB;  Service: Cardiovascular;  Laterality: N/A;  . LEFT HEART CATHETERIZATION WITH CORONARY ANGIOGRAM N/A 03/05/2014   Procedure: LEFT HEART CATHETERIZATION WITH CORONARY ANGIOGRAM;  Surgeon: Clent Demark, MD;  Location: [facility] CATH LAB;  Service: Cardiovascular;  Laterality: N/A;  . LUMBAR LAMINECTOMY  2003  . MASTECTOMY  1987   Right  . MASTECTOMY    . MASTOIDECTOMY  childhood  . REDUCTION MAMMAPLASTY  1998   Left  . SHOULDER SURGERY  02/2005    Bilateral fractures with multiple surgeries   Past Medical History:  Diagnosis Date  . ACE-inhibitor cough   . Allergic rhinitis, cause unspecified   . Angina   . Anxiety   . CHF (congestive heart failure) ([place])   . Chronic airway obstruction, not elsewhere classified   . Compression fracture of T12 vertebra ([place]) 10/28/2011  . Depressive disorder, not elsewhere classified   . Esophageal reflux   . Heart murmur    aS CHILD  . Malignant neoplasm of breast (female), unspecified site   . Myocardial infarct   . Neuromuscular disorder ([place])    NEROPATHY FROM STENOSIS  . Occlusion and stenosis of carotid artery without mention of cerebral infarction   . Osteoarthrosis, unspecified whether generalized or localized, unspecified site   . Osteoporosis, unspecified   . Other and unspecified hyperlipidemia   . Other dyspnea and respiratory abnormality   . Peripheral vascular disease, unspecified   . Personal history of malignant neoplasm of breast   . Pneumonia   . Recurrent upper respiratory infection (URI)   . Shortness of breath   . Spinal stenosis   . Unspecified cardiovascular disease   . Unspecified essential hypertension   . Unspecified hearing loss   . Unspecified urinary incontinence    BP (!) 95/53   Pulse 81   Resp 14   SpO2 93%   Opioid Risk Score:   Fall Risk Score:  `1  Depression screen PHQ 2/9  Depression screen [facility] 2/9 05/29/2016 10/22/2015 03/02/2013  Decreased Interest 1 1 0  Down, Depressed, Hopeless 1 1 0  PHQ - 2 Score 2 2 0  Altered sleeping 1 0 -  Tired, decreased energy 1 1 -  Change in appetite 1 1 -  Feeling bad or failure about yourself  2 0 -  Trouble concentrating 0 0 -  Moving slowly or fidgety/restless 1 0 -  Suicidal thoughts 0 0 -  PHQ-9 Score 8 4 -  Difficult doing work/chores Somewhat difficult Not difficult at all -  Some recent data might be hidden    Review of Systems  Constitutional: Negative.   HENT: Negative.   Eyes: Negative.    Respiratory: Positive for shortness of breath and wheezing.        Sinus Infection  Cardiovascular: Negative.   Gastrointestinal: Negative.   Endocrine: Negative.   Genitourinary: Negative.   Musculoskeletal: Negative.   Skin: Negative.   Allergic/Immunologic: Negative.   Neurological: Negative.   Hematological: Negative.   Psychiatric/Behavioral: Negative.   All other systems reviewed and are negative.      Objective:   Physical Exam  Constitutional: She is oriented to person, place, and time. She appears well-developed and well-nourished.  HENT:  Head: Normocephalic and atraumatic.  Eyes: Conjunctivae and EOM are normal. Pupils are equal, round, and reactive to  light.  Neurological: She is alert and oriented to person, place, and time.  Skin: Skin is warm and dry.  Nursing note and vitals reviewed.  motor strength is 5/5 bilateral deltoid bulk. Biceps, triceps, grip,  Ambulates with a walker. No evidence of toe drag or knee instability. Sensation intact bilat hands Pain with carpal metacarpal joint stress. Bilateral #1 CMC       Assessment & Plan:  1. Cervical stenosis without evidence of myelopathy, chronic neck pain with good relief following left C 4-5 cervical medial branch blocks, discussed the temporary effect of blocks. 100% relief, will need repeat. Patient is concerned about co-pay.  Continue oxycodone 5 mg twice a day Continue opioid monitoring program. This consists of regular clinic visits, examinations, urine drug screen, pill counts as well as use of [place] controlled substance reporting System.  Initial urine toxicology positive for EtOH. As discussed communion wine should not cause this, nor should Marsala sauce. Will need to continue monitoring. If positive, again would not be able to prescribe narcotic analgesics.

## 2016-08-04 ENCOUNTER — Ambulatory Visit (INDEPENDENT_AMBULATORY_CARE_PROVIDER_SITE_OTHER): Payer: Medicare Other | Admitting: Orthopedic Surgery

## 2016-08-04 ENCOUNTER — Ambulatory Visit (INDEPENDENT_AMBULATORY_CARE_PROVIDER_SITE_OTHER): Payer: Medicare Other

## 2016-08-04 VITALS — Ht 65.0 in | Wt 180.0 lb

## 2016-08-04 DIAGNOSIS — M84374A Stress fracture, right foot, initial encounter for fracture: Secondary | ICD-10-CM | POA: Diagnosis not present

## 2016-08-04 DIAGNOSIS — M79671 Pain in right foot: Secondary | ICD-10-CM | POA: Diagnosis not present

## 2016-08-04 DIAGNOSIS — M84376D Stress fracture, unspecified foot, subsequent encounter for fracture with routine healing: Secondary | ICD-10-CM | POA: Insufficient documentation

## 2016-08-04 NOTE — Progress Notes (Signed)
Office Visit Note   Patient: Haley Harris           Date of Birth: 25-Jun-1929           MRN: [id number] Visit Date: 08/04/2016              Requested by: Venia Carbon, MD [place], [place] [address] PCP: [name], MD  Chief Complaint  Patient presents with  . Right Foot - Pain    HPI: Patient states that she woke up on Sunday and had severe right foot pain. She states that she had done a lot of walking on Friday and now the dorsum of her foot is painful and swollen and she has pain with ambulation. She is in regular shoe wear and she ambulates with a rolling walker. "my bones are brittle" and is afraid that she may have fractured something in her foot. Autumn L Forrest, RMA  Patient states she is on 160 mg of Lasix a day and not taking a potassium supplement. She is not taking any significant vitamin D3 supplements.  Assessment & Plan: Visit Diagnoses:  1. Pain in right foot   2. Stress fracture of metatarsal bone of right foot, initial encounter     Plan: We will place her in a postoperative shoe for the stress fracture base of the second metatarsal. Recommended vitamin D3 5000 units a day. Recommended 3 ounces of coconut water a day for potassium replacement.  Follow-Up Instructions: Return in about 3 weeks (around 08/25/2016).   Ortho Exam Examination patient is alert oriented no adenopathy well-dressed normal affect normal rest wear for she does have an antalgic gait. She has a palpable pulse she does have some bruising over the base of the second metatarsal is point tender to palpation over the base of the second metatarsal. Distraction of the first metatarsal is nonpainful she has pain to palpation over the second metatarsal head as well.  Imaging: Xr Foot Complete Right  Result Date: 08/04/2016 Three-view radiographs of the right foot shows an old fracture healing of the third metatarsal and fourth metatarsal but no displaced fracture of the  second metatarsal. No widening of his [place]anna Hy complex.   Orders:  Orders Placed This Encounter  Procedures  . XR Foot Complete Right   No orders of the defined types were placed in this encounter.    Procedures: No procedures performed  Clinical Data: No additional findings.  Subjective: Review of Systems  Objective: Vital Signs: Ht 5\' 5"  (1.651 m)   Wt 180 lb (81.6 kg)   BMI 29.95 kg/m   Specialty Comments:  No specialty comments available.  PMFS History: Patient Active Problem List   Diagnosis Date Noted  . Metatarsal stress fracture of right foot 08/04/2016  . Cervical spondylosis without myelopathy 07/31/2016  . Primary osteoarthritis of both first carpometacarpal joints 07/31/2016  . Acute non-recurrent pansinusitis 07/22/2016  . Acute on chronic combined systolic and diastolic heart failure ([place]) 07/22/2016  . Spinal stenosis in cervical region 05/29/2016  . Venous stasis dermatitis 11/13/2015  . Essential hypertension 10/30/2015  . COPD with acute bronchitis ([place]) 09/27/2015  . Diverticulitis of colon 02/18/2015  . Acute blood loss anemia   . Hyponatremia   . CAD in native artery   . Chronic combined systolic and diastolic CHF (congestive heart failure) ([place])   . Chronic kidney disease, stage III (moderate)   . Other emphysema ([place])   . PVD (peripheral vascular disease) ([place])   .  Right adrenal mass ([place]) 01/18/2015  . Chronic venous insufficiency 12/18/2014  . Advanced directives, counseling/discussion 04/02/2014  . Neuropathy due to peripheral vascular disease ([place]) 03/11/2014  . Chronic combined systolic and diastolic CHF, NYHA class 1 ([place]) 03/06/2014  . Routine general medical examination at a health care facility 03/02/2013  . Constipation due to pain medication 10/28/2011  . CAD (coronary artery disease), native coronary artery 06/05/2011  . COPD exacerbation ([place]) 10/02/2010  . Carotid stenosis 07/19/2009  . PERIPHERAL VASCULAR DISEASE 04/07/2007   . Dyslipidemia 10/30/2006  . Episodic mood disorder ([place]) 10/30/2006  . COPD (chronic obstructive pulmonary disease) ([place]) 10/30/2006  . GERD 10/30/2006  . Osteoarthritis 10/30/2006  . Osteoporosis 10/30/2006  . URINARY INCONTINENCE 10/30/2006   Past Medical History:  Diagnosis Date  . ACE-inhibitor cough   . Allergic rhinitis, cause unspecified   . Angina   . Anxiety   . CHF (congestive heart failure) ([place])   . Chronic airway obstruction, not elsewhere classified   . Compression fracture of T12 vertebra ([place]) 10/28/2011  . Depressive disorder, not elsewhere classified   . Esophageal reflux   . Heart murmur    aS CHILD  . Malignant neoplasm of breast (female), unspecified site   . Myocardial infarct   . Neuromuscular disorder ([place])    NEROPATHY FROM STENOSIS  . Occlusion and stenosis of carotid artery without mention of cerebral infarction   . Osteoarthrosis, unspecified whether generalized or localized, unspecified site   . Osteoporosis, unspecified   . Other and unspecified hyperlipidemia   . Other dyspnea and respiratory abnormality   . Peripheral vascular disease, unspecified   . Personal history of malignant neoplasm of breast   . Pneumonia   . Recurrent upper respiratory infection (URI)   . Shortness of breath   . Spinal stenosis   . Unspecified cardiovascular disease   . Unspecified essential hypertension   . Unspecified hearing loss   . Unspecified urinary incontinence     Family History  Problem Relation Age of Onset  . Heart attack Father 17  . Cancer Mother     ovarian  . Cancer Brother     lung  . Arthritis      family    Past Surgical History:  Procedure Laterality Date  . ABDOMINAL HYSTERECTOMY    . APPENDECTOMY  1948  . BREAST IMPLANT REMOVAL  06/12/09   right  . BREAST RECONSTRUCTION  1998   Reconstruction   . CARDIAC CATHETERIZATION N/A 01/25/2015   Procedure: Left Heart Cath and Coronary Angiography;  Surgeon: Charolette Forward, MD;  Location:  [place] CV LAB;  Service: Cardiovascular;  Laterality: N/A;  . CORONARY ANGIOPLASTY  11/12   distal RCA  . FLEXIBLE SIGMOIDOSCOPY N/A 01/21/2015   Procedure: FLEXIBLE SIGMOIDOSCOPY;  Surgeon: Richmond Campbell, MD;  Location: [facility] ENDOSCOPY;  Service: Endoscopy;  Laterality: N/A;  . FLEXIBLE SIGMOIDOSCOPY  01/22/2015      . FRACTURE SURGERY     fracture right elbow  . LEFT HEART CATHETERIZATION WITH CORONARY ANGIOGRAM N/A 06/05/2011   Procedure: LEFT HEART CATHETERIZATION WITH CORONARY ANGIOGRAM;  Surgeon: Clent Demark, MD;  Location: [facility] CATH LAB;  Service: Cardiovascular;  Laterality: N/A;  . LEFT HEART CATHETERIZATION WITH CORONARY ANGIOGRAM N/A 03/05/2014   Procedure: LEFT HEART CATHETERIZATION WITH CORONARY ANGIOGRAM;  Surgeon: Clent Demark, MD;  Location: [place] CATH LAB;  Service: Cardiovascular;  Laterality: N/A;  . LUMBAR LAMINECTOMY  2003  . MASTECTOMY  1987   Right  . MASTECTOMY    .  MASTOIDECTOMY  childhood  . REDUCTION MAMMAPLASTY  1998   Left  . SHOULDER SURGERY  02/2005   Bilateral fractures with multiple surgeries   Social History   Occupational History  . 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  [employer]   Social History Main Topics  . Smoking status: Former Smoker    Quit date: 07/13/1974  . Smokeless tobacco: Never Used  . Alcohol use 0.0 oz/week     Comment: occasional  . Drug use: No  . Sexual activity: No

## 2016-08-08 ENCOUNTER — Other Ambulatory Visit: Payer: Self-pay | Admitting: Internal Medicine

## 2016-08-25 ENCOUNTER — Ambulatory Visit (INDEPENDENT_AMBULATORY_CARE_PROVIDER_SITE_OTHER): Payer: Medicare Other | Admitting: Orthopedic Surgery

## 2016-08-25 ENCOUNTER — Ambulatory Visit (INDEPENDENT_AMBULATORY_CARE_PROVIDER_SITE_OTHER): Payer: Medicare Other

## 2016-08-25 ENCOUNTER — Encounter (INDEPENDENT_AMBULATORY_CARE_PROVIDER_SITE_OTHER): Payer: Self-pay | Admitting: Orthopedic Surgery

## 2016-08-25 VITALS — Ht 65.0 in | Wt 180.0 lb

## 2016-08-25 DIAGNOSIS — M84374D Stress fracture, right foot, subsequent encounter for fracture with routine healing: Secondary | ICD-10-CM

## 2016-08-25 DIAGNOSIS — M79671 Pain in right foot: Secondary | ICD-10-CM | POA: Diagnosis not present

## 2016-08-25 NOTE — Progress Notes (Signed)
Office Visit Note   Patient: Haley Harris           Date of Birth: October 17, 1928           MRN: [id number] Visit Date: 08/25/2016              Requested by: Venia Carbon, MD [place], [place] [id number] PCP: [name], MD  Chief Complaint  Patient presents with  . Left Foot - Fracture    Stress fracture base of the second metatarsal    HPI: Patient is 83 y.o female who presents for 3 week follow up of 2nd metatarsal stress fracture. She is doing well overall. She has noticed a decrease in swelling. She has continued with vitamin d3 supplementation and coconut water. Maxcine Ham, RT    Assessment & Plan: Visit Diagnoses:  1. Pain in right foot     Plan: Follow-up the office in 4 weeks with repeat 3 view radiographs of the right foot. Continue protected weightbearing with the postoperative shoe patient was given instructions for heel cord stretching to do 5 times a day this was demonstrated to her.  Follow-Up Instructions: No Follow-up on file.   Ortho Exam On examination patient is alert oriented no adenopathy well-dressed normal affect normal respiratory effort she has an antalgic gait uses a rolling walker for ambulation. Examination the right foot she does have some heel cord tightness with dorsiflexion only to neutral. Her foot is neurovascularly intact no open wounds. She is tender to palpation over the second metatarsal.  Imaging: No results found.  Orders:  Orders Placed This Encounter  Procedures  . XR Foot Complete Right   No orders of the defined types were placed in this encounter.    Procedures: No procedures performed  Clinical Data: No additional findings.  Subjective: Review of Systems  Objective: Vital Signs: Ht 5\' 5"  (1.651 m)   Wt 180 lb (81.6 kg)   BMI 29.95 kg/m   Specialty Comments:  No specialty comments available.  PMFS History: Patient Active Problem List   Diagnosis Date Noted  . Metatarsal stress  fracture of right foot 08/04/2016  . Cervical spondylosis without myelopathy 07/31/2016  . Primary osteoarthritis of both first carpometacarpal joints 07/31/2016  . Acute non-recurrent pansinusitis 07/22/2016  . Acute on chronic combined systolic and diastolic heart failure ([place]) 07/22/2016  . Spinal stenosis in cervical region 05/29/2016  . Venous stasis dermatitis 11/13/2015  . Essential hypertension 10/30/2015  . COPD with acute bronchitis ([place]) 09/27/2015  . Diverticulitis of colon 02/18/2015  . Acute blood loss anemia   . Hyponatremia   . CAD in native artery   . Chronic combined systolic and diastolic CHF (congestive heart failure) ([place])   . Chronic kidney disease, stage III (moderate)   . Other emphysema ([place])   . PVD (peripheral vascular disease) ([place])   . Right adrenal mass ([place]) 01/18/2015  . Chronic venous insufficiency 12/18/2014  . Advanced directives, counseling/discussion 04/02/2014  . Neuropathy due to peripheral vascular disease ([place]) 03/11/2014  . Chronic combined systolic and diastolic CHF, NYHA class 1 ([place]) 03/06/2014  . Routine general medical examination at a health care facility 03/02/2013  . Constipation due to pain medication 10/28/2011  . CAD (coronary artery disease), native coronary artery 06/05/2011  . COPD exacerbation ([place]) 10/02/2010  . Carotid stenosis 07/19/2009  . PERIPHERAL VASCULAR DISEASE 04/07/2007  . Dyslipidemia 10/30/2006  . Episodic mood disorder ([place]) 10/30/2006  . COPD (chronic obstructive  pulmonary disease) ([place]) 10/30/2006  . GERD 10/30/2006  . Osteoarthritis 10/30/2006  . Osteoporosis 10/30/2006  . URINARY INCONTINENCE 10/30/2006   Past Medical History:  Diagnosis Date  . ACE-inhibitor cough   . Allergic rhinitis, cause unspecified   . Angina   . Anxiety   . CHF (congestive heart failure) ([place])   . Chronic airway obstruction, not elsewhere classified   . Compression fracture of T12 vertebra ([place]) 10/28/2011  . Depressive  disorder, not elsewhere classified   . Esophageal reflux   . Heart murmur    aS CHILD  . Malignant neoplasm of breast (female), unspecified site   . Myocardial infarct   . Neuromuscular disorder ([place])    NEROPATHY FROM STENOSIS  . Occlusion and stenosis of carotid artery without mention of cerebral infarction   . Osteoarthrosis, unspecified whether generalized or localized, unspecified site   . Osteoporosis, unspecified   . Other and unspecified hyperlipidemia   . Other dyspnea and respiratory abnormality   . Peripheral vascular disease, unspecified   . Personal history of malignant neoplasm of breast   . Pneumonia   . Recurrent upper respiratory infection (URI)   . Shortness of breath   . Spinal stenosis   . Unspecified cardiovascular disease   . Unspecified essential hypertension   . Unspecified hearing loss   . Unspecified urinary incontinence     Family History  Problem Relation Age of Onset  . Heart attack Father 39  . Cancer Mother     ovarian  . Cancer Brother     lung  . Arthritis      family    Past Surgical History:  Procedure Laterality Date  . ABDOMINAL HYSTERECTOMY    . APPENDECTOMY  1948  . BREAST IMPLANT REMOVAL  06/12/09   right  . BREAST RECONSTRUCTION  1998   Reconstruction   . CARDIAC CATHETERIZATION N/A 01/25/2015   Procedure: Left Heart Cath and Coronary Angiography;  Surgeon: Charolette Forward, MD;  Location: [facility] CV LAB;  Service: Cardiovascular;  Laterality: N/A;  . CORONARY ANGIOPLASTY  11/12   distal RCA  . FLEXIBLE SIGMOIDOSCOPY N/A 01/21/2015   Procedure: FLEXIBLE SIGMOIDOSCOPY;  Surgeon: Richmond Campbell, MD;  [facility] ENDOSCOPY;  Service: Endoscopy;  Laterality: N/A;  . FLEXIBLE SIGMOIDOSCOPY  01/22/2015      . FRACTURE SURGERY     fracture right elbow  . LEFT HEART CATHETERIZATION WITH CORONARY ANGIOGRAM N/A 06/05/2011   Procedure: LEFT HEART CATHETERIZATION WITH CORONARY ANGIOGRAM;  Surgeon: Clent Demark, MD;  Location: [facility] CATH  LAB;  Service: Cardiovascular;  Laterality: N/A;  . LEFT HEART CATHETERIZATION WITH CORONARY ANGIOGRAM N/A 03/05/2014   Procedure: LEFT HEART CATHETERIZATION WITH CORONARY ANGIOGRAM;  Surgeon: Clent Demark, MD;  Location: [facility] CATH LAB;  Service: Cardiovascular;  Laterality: N/A;  . LUMBAR LAMINECTOMY  2003  . MASTECTOMY  1987   Right  . MASTECTOMY    . MASTOIDECTOMY  childhood  . REDUCTION MAMMAPLASTY  1998   Left  . SHOULDER SURGERY  02/2005   Bilateral fractures with multiple surgeries   Social History   Occupational History  . 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  [employer]   Social History Main Topics  . Smoking status: Former Smoker    Quit date: 07/13/1974  . Smokeless tobacco: Never Used  . Alcohol use 0.0 oz/week     Comment: occasional  . Drug use: No  . Sexual activity: No

## 2016-08-28 ENCOUNTER — Other Ambulatory Visit: Payer: Self-pay | Admitting: Internal Medicine

## 2016-08-31 ENCOUNTER — Encounter: Payer: Medicare Other | Attending: Physical Medicine & Rehabilitation

## 2016-08-31 ENCOUNTER — Other Ambulatory Visit: Payer: Self-pay

## 2016-08-31 ENCOUNTER — Ambulatory Visit (HOSPITAL_BASED_OUTPATIENT_CLINIC_OR_DEPARTMENT_OTHER): Payer: Medicare Other | Admitting: Physical Medicine & Rehabilitation

## 2016-08-31 DIAGNOSIS — M25511 Pain in right shoulder: Secondary | ICD-10-CM | POA: Insufficient documentation

## 2016-08-31 DIAGNOSIS — G8929 Other chronic pain: Secondary | ICD-10-CM | POA: Diagnosis not present

## 2016-08-31 DIAGNOSIS — M1610 Unilateral primary osteoarthritis, unspecified hip: Secondary | ICD-10-CM | POA: Diagnosis not present

## 2016-08-31 DIAGNOSIS — M47812 Spondylosis without myelopathy or radiculopathy, cervical region: Secondary | ICD-10-CM

## 2016-08-31 DIAGNOSIS — M4802 Spinal stenosis, cervical region: Secondary | ICD-10-CM | POA: Diagnosis not present

## 2016-08-31 DIAGNOSIS — M19041 Primary osteoarthritis, right hand: Secondary | ICD-10-CM | POA: Diagnosis not present

## 2016-08-31 DIAGNOSIS — M25562 Pain in left knee: Secondary | ICD-10-CM | POA: Diagnosis not present

## 2016-08-31 DIAGNOSIS — M25561 Pain in right knee: Secondary | ICD-10-CM | POA: Insufficient documentation

## 2016-08-31 DIAGNOSIS — M549 Dorsalgia, unspecified: Secondary | ICD-10-CM | POA: Insufficient documentation

## 2016-08-31 DIAGNOSIS — M19042 Primary osteoarthritis, left hand: Secondary | ICD-10-CM | POA: Insufficient documentation

## 2016-08-31 DIAGNOSIS — M25552 Pain in left hip: Secondary | ICD-10-CM | POA: Insufficient documentation

## 2016-08-31 DIAGNOSIS — M542 Cervicalgia: Secondary | ICD-10-CM | POA: Diagnosis not present

## 2016-08-31 DIAGNOSIS — M25551 Pain in right hip: Secondary | ICD-10-CM | POA: Insufficient documentation

## 2016-08-31 MED ORDER — CARVEDILOL 12.5 MG PO TABS: ORAL_TABLET | ORAL | 0 refills | Status: DC

## 2016-08-31 MED ORDER — OXYCODONE HCL 5 MG PO TABS: 5.0000 mg | ORAL_TABLET | Freq: Two times a day (BID) | ORAL | 0 refills | Status: DC

## 2016-08-31 NOTE — Progress Notes (Signed)
Cervical medial branch blocks under fluoroscopic guidance  Indication: Cervical axial pain without radiculitis. Pain is unresponsive to conservative care and interferes with activities of daily living.  Informed consent was obtained after describing risks and benefits of the procedure with the patient this includes bleeding bruising and infection temporary or permanent paralysis. Patient elected to proceed and has given written consent. Patient placed in a lateral decubitus position. C-arm was positioned to insure adequate view of the articular pillars in a lateral view. Area was marked and prepped with Betadine. 1/2 cc of 1% lidocaine injected into the skin and subcutaneous tissue using a 27-gauge 0.5 inch needle. Then a 25-gauge 1.5 inch needle was inserted under fluoroscopic guidance first targeting the C4 articular pillar center. Bone contact made and confirmed under AP imaging. Then 0.4 mL of Isovue 200 was injected Then 0.5 mL of 1% MPF lidocaine injected. This same procedure was repeated at C5 using same technique. Patient tolerated the procedure well. Post procedure instructions given. Adequate postoperative monitoring was performed. See flowsheet

## 2016-08-31 NOTE — Patient Instructions (Signed)
Cervical medial branch blocks performed on  Please indicate whether pain reduced at least 50%

## 2016-08-31 NOTE — Telephone Encounter (Signed)
Rx sent electronically.  

## 2016-08-31 NOTE — Progress Notes (Signed)
  [facility]   Name: Haley Harris DOB:11-Dec-1928 MRN: [id number]  Date:08/31/2016  Physician: Alysia Penna, MD    Nurse/CMA: Bright CMA  Allergies:  Allergies  Allergen Reactions  . Amoxicillin-Pot Clavulanate Anaphylaxis  . Penicillins Anaphylaxis  . Cephalexin Other (See Comments)    Reaction unknown  . Clarithromycin Other (See Comments)    Reaction unknown  . Lidocaine     Patient had tremors following use of lidocaine pain patches  . Nifedipine Other (See Comments)    Reaction unknown  . Nsaids Other (See Comments)    Heart issue  . Olmesartan Medoxomil     REACTION: cough;  But tolerating Losartan 02/2014  . Tramadol Hcl Other (See Comments)    Reaction unknown    Consent Signed: Yes.    Is patient diabetic? No.  CBG today?   Pregnant: No. LMP: No LMP recorded. Patient has had a hysterectomy. (age [age])  Anticoagulants: no Anti-inflammatory: no Antibiotics: no  Procedure:Left C4-5 MBB  Position: Right Lateral Start Time:110pm End Time:137pm Fluoro Time:82s RN/CMA Bright CMA Bright CMA    Time 1245pm 143pm    BP 110/73 138/82    Pulse 69 72    Respirations 16 16    O2 Sat  92    S/S 6 6    Pain Level 7 7     D/C home with son chuck, patient A & O X 3, D/C instructions reviewed, and sits independently.

## 2016-09-07 ENCOUNTER — Ambulatory Visit (INDEPENDENT_AMBULATORY_CARE_PROVIDER_SITE_OTHER): Payer: Medicare Other | Admitting: Internal Medicine

## 2016-09-07 ENCOUNTER — Encounter: Payer: Self-pay | Admitting: Internal Medicine

## 2016-09-07 ENCOUNTER — Telehealth: Payer: Self-pay | Admitting: Internal Medicine

## 2016-09-07 VITALS — BP 116/70 | HR 84 | Temp 98.6°F | Ht 65.0 in | Wt 178.0 lb

## 2016-09-07 DIAGNOSIS — Z0000 Encounter for general adult medical examination without abnormal findings: Secondary | ICD-10-CM | POA: Diagnosis not present

## 2016-09-07 DIAGNOSIS — I5042 Chronic combined systolic (congestive) and diastolic (congestive) heart failure: Secondary | ICD-10-CM | POA: Diagnosis not present

## 2016-09-07 DIAGNOSIS — E278 Other specified disorders of adrenal gland: Secondary | ICD-10-CM

## 2016-09-07 DIAGNOSIS — Z7189 Other specified counseling: Secondary | ICD-10-CM

## 2016-09-07 DIAGNOSIS — J449 Chronic obstructive pulmonary disease, unspecified: Secondary | ICD-10-CM

## 2016-09-07 DIAGNOSIS — N183 Chronic kidney disease, stage 3 unspecified: Secondary | ICD-10-CM

## 2016-09-07 DIAGNOSIS — E279 Disorder of adrenal gland, unspecified: Secondary | ICD-10-CM

## 2016-09-07 DIAGNOSIS — G63 Polyneuropathy in diseases classified elsewhere: Secondary | ICD-10-CM

## 2016-09-07 DIAGNOSIS — I739 Peripheral vascular disease, unspecified: Secondary | ICD-10-CM | POA: Diagnosis not present

## 2016-09-07 DIAGNOSIS — F39 Unspecified mood [affective] disorder: Secondary | ICD-10-CM

## 2016-09-07 LAB — COMPREHENSIVE METABOLIC PANEL
ALT: 10 U/L (ref 0–35)
AST: 16 U/L (ref 0–37)
Albumin: 4 g/dL (ref 3.5–5.2)
Alkaline Phosphatase: 52 U/L (ref 39–117)
BUN: 30 mg/dL — ABNORMAL HIGH (ref 6–23)
CALCIUM: 9.1 mg/dL (ref 8.4–10.5)
CO2: 30 meq/L (ref 19–32)
Chloride: 100 mEq/L (ref 96–112)
Creatinine, Ser: 1.26 mg/dL — ABNORMAL HIGH (ref 0.40–1.20)
GFR: 42.85 mL/min — AB (ref >60.00)
GLUCOSE: 101 mg/dL — AB (ref 70–99)
Potassium: 4.6 mEq/L (ref 3.5–5.1)
Sodium: 138 mEq/L (ref 135–145)
Total Bilirubin: 0.6 mg/dL (ref 0.2–1.2)
Total Protein: 6.9 g/dL (ref 6.0–8.3)

## 2016-09-07 LAB — CBC WITH DIFFERENTIAL/PLATELET
BASOS ABS: 0.1 10*3/uL (ref 0.0–0.1)
Basophils Relative: 0.9 % (ref 0.0–3.0)
EOS PCT: 4.2 % (ref 0.0–5.0)
Eosinophils Absolute: 0.3 10*3/uL (ref 0.0–0.7)
HEMATOCRIT: 36.9 % (ref 36.0–46.0)
Hemoglobin: 12.2 g/dL (ref 12.0–15.0)
LYMPHS ABS: 0.9 10*3/uL (ref 0.7–4.0)
LYMPHS PCT: 14.3 % (ref 12.0–46.0)
MCHC: 33.2 g/dL (ref 30.0–36.0)
MCV: 98.7 fl (ref 78.0–100.0)
MONOS PCT: 12.7 % — AB (ref 3.0–12.0)
Monocytes Absolute: 0.8 10*3/uL (ref 0.1–1.0)
NEUTROS ABS: 4.4 10*3/uL (ref 1.4–7.7)
NEUTROS PCT: 67.9 % (ref 43.0–77.0)
PLATELETS: 226 10*3/uL (ref 150.0–400.0)
RBC: 3.73 Mil/uL — ABNORMAL LOW (ref 3.87–5.11)
RDW: 13.2 % (ref 11.5–15.5)
WBC: 6.5 10*3/uL (ref 4.0–10.5)

## 2016-09-07 LAB — T4, FREE: Free T4: 1.09 ng/dL (ref 0.60–1.60)

## 2016-09-07 NOTE — Assessment & Plan Note (Signed)
Has been compensated Weight stable and no edema No changes needed

## 2016-09-07 NOTE — Assessment & Plan Note (Signed)
No claudication Limited activity

## 2016-09-07 NOTE — Assessment & Plan Note (Signed)
Will recheck On ARB 

## 2016-09-07 NOTE — Assessment & Plan Note (Signed)
I have personally reviewed the Medicare Annual Wellness questionnaire and have noted 1. The patient's medical and social history 2. Their use of alcohol, tobacco or illicit drugs 3. Their current medications and supplements 4. The patient's functional ability including ADL's, fall risks, home safety risks and hearing or visual             impairment. 5. Diet and physical activities 6. Evidence for depression or mood disorders  The patients weight, height, BMI and visual acuity have been recorded in the chart I have made referrals, counseling and provided education to the patient based review of the above and I have provided the pt with a written personalized care plan for preventive services.  I have provided you with a copy of your personalized plan for preventive services. Please take the time to review along with your updated medication list.  No cancer screening due to age UTD on imms Discussed trying to stay as active as possible

## 2016-09-07 NOTE — Assessment & Plan Note (Signed)
Mostly depression--related to chronic pain, etc Doing okay with the escitalopram

## 2016-09-07 NOTE — Assessment & Plan Note (Signed)
Discussed --okay to try decreasing dose of gabapentin

## 2016-09-07 NOTE — Assessment & Plan Note (Signed)
Has DNR 

## 2016-09-07 NOTE — Assessment & Plan Note (Signed)
Adenoma based on imaging No evidence of endocrine abnormality

## 2016-09-07 NOTE — Telephone Encounter (Signed)
Pt wanted to know when her next prolia shot is due.  Please call pt or daughter.

## 2016-09-07 NOTE — Assessment & Plan Note (Signed)
Still with some restriction of activity No changes needed in meds

## 2016-09-07 NOTE — Progress Notes (Signed)
Pre visit review using our clinic review tool, if applicable. No additional management support is needed unless otherwise documented below in the visit note. 

## 2016-09-07 NOTE — Progress Notes (Signed)
Subjective:    Patient ID: Haley Harris, female    DOB: May 09, 1929, 83 y.o.   MRN: [id number]  HPI Here with daughter for medicare wellness and follow up of chronic health conditions Reviewed form and advanced directives Reviewed other doctors No alcohol or tobacco Not really able to exercise Vision okay--needs stronger readers Hearing is okay with aides Had fall ---tripped in kitchen-- no injury. Fell and hit head-- injury with that. Chronic mood issues. Several hospital stays--had seen her after them  Doesn't drive. Daughter handles finances and shopping. She occasionally cooks. Does all ADLs and laundry  Back on lexapro Mostly for depression-- and this has been effective No consistent sadness, etc Not anhedonic  Having problems sleeping Recent shots on left neck for ongoing pain--hasn't helped May need cautery to them Stress fracture also in right foot--she relates to having walked too long in the wrong shoes In surgical shoe now Working on trying to strengthen her Achilles--but this is difficult  Ongoing issues with neuropathy Wonders whether gabapentin is affecting her eating and weight Daughter lost weight off it. Discussed okay to try reducing dose  Still gets SOB--- with activity. May be some easier-- has 10 steps at church and needed inhaler after doing them recently No regular cough Some wheezing--uses the nebulizer then  No chest pain No palpitations Some dizziness if she moves too fast (like lightheaded). No syncope No edema (just some foot swelling with the fracture)  Has knee pain Doesn't feel her legs are hurting with the peripheral vascular disease though  Adrenal mass found years ago CT and MRI done in 2018 Never had indication this was causing a problem  Current Outpatient Prescriptions on File Prior to Visit  Medication Sig Dispense Refill  . acetaminophen (TYLENOL) 325 MG tablet Take 2 tablets (650 mg total) by mouth every 4 (four) hours as  needed for headache or mild pain.    Marland Kitchen albuterol (PROVENTIL) (2.5 MG/3ML) 0.083% nebulizer solution Take 3 mLs (2.5 mg total) by nebulization every 6 (six) hours as needed for wheezing or shortness of breath. 540 mL 3  . aspirin 81 MG tablet Take 81 mg by mouth daily after lunch.     . Calcium Carbonate-Vitamin D (CALTRATE 600+D PO) Take 1 tablet by mouth daily after lunch.     . carvedilol (COREG) 12.5 MG tablet Take 1 tablet by mouth two  times daily with a meal 180 tablet 0  . denosumab (PROLIA) 60 MG/ML SOLN injection Inject 60 mg into the skin every 6 (six) months. Reported on 11/05/2015    . escitalopram (LEXAPRO) 10 MG tablet Take 10 mg by mouth daily.    . famotidine (PEPCID) 20 MG tablet Take 1 tablet (20 mg total) by mouth 2 (two) times daily. 180 tablet 3  . fexofenadine (ALLEGRA) 180 MG tablet Take 180 mg by mouth at bedtime.    . fluticasone (FLONASE) 50 MCG/ACT nasal spray Use 2 sprays in each  nostril daily 48 g 3  . furosemide (LASIX) 40 MG tablet Take 1-2 tablets (40-80 mg total) by mouth 2 (two) times daily. 120 tablet 3  . gabapentin (NEURONTIN) 100 MG capsule TAKE 1 CAPSULE BY MOUTH TWO TIMES DAILY 180 capsule 3  . levocetirizine (XYZAL) 5 MG tablet Take 5 mg by mouth every evening.    Marland Kitchen losartan (COZAAR) 50 MG tablet Take 1 tablet by mouth  daily 90 tablet 1  . montelukast (SINGULAIR) 10 MG tablet TAKE 1 TABLET BY MOUTH DAILY  90 tablet 3  . Multiple Vitamin (MULITIVITAMIN WITH MINERALS) TABS Take 1 tablet by mouth daily after lunch.     . oxyCODONE (OXY IR/ROXICODONE) 5 MG immediate release tablet Take 1 tablet (5 mg total) by mouth 2 (two) times daily. 60 tablet 0  . pravastatin (PRAVACHOL) 80 MG tablet Take 1 tablet by mouth  daily 90 tablet 3  . PROAIR HFA 108 (90 Base) MCG/ACT inhaler USE 2 PUFFS EVERY 6 HOURS AS NEEDED FOR WHEEZING OR SHORTNESS OF BREATH 8.5 g 5  . spironolactone (ALDACTONE) 25 MG tablet TAKE 1 TABLET BY MOUTH  DAILY 90 tablet 3  . vitamin B-12  (CYANOCOBALAMIN) 500 MCG tablet Take 1,000 mcg by mouth 3 (three) times a week. Mon, Wed, Fri     No current facility-administered medications on file prior to visit.     Allergies  Allergen Reactions  . Amoxicillin-Pot Clavulanate Anaphylaxis  . Penicillins Anaphylaxis  . Cephalexin Other (See Comments)    Reaction unknown  . Clarithromycin Other (See Comments)    Reaction unknown  . Lidocaine     Patient had tremors following use of lidocaine pain patches  . Nifedipine Other (See Comments)    Reaction unknown  . Nsaids Other (See Comments)    Heart issue  . Olmesartan Medoxomil     REACTION: cough;  But tolerating Losartan 02/2014  . Tramadol Hcl Other (See Comments)    Reaction unknown    Past Medical History:  Diagnosis Date  . ACE-inhibitor cough   . Allergic rhinitis, cause unspecified   . Angina   . Anxiety   . CHF (congestive heart failure) ([place])   . Chronic airway obstruction, not elsewhere classified   . Compression fracture of T12 vertebra ([place]) 10/28/2011  . Depressive disorder, not elsewhere classified   . Esophageal reflux   . Heart murmur    aS CHILD  . Malignant neoplasm of breast (female), unspecified site   . Myocardial infarct   . Neuromuscular disorder ([place])    NEROPATHY FROM STENOSIS  . Occlusion and stenosis of carotid artery without mention of cerebral infarction   . Osteoarthrosis, unspecified whether generalized or localized, unspecified site   . Osteoporosis, unspecified   . Other and unspecified hyperlipidemia   . Other dyspnea and respiratory abnormality   . Peripheral vascular disease, unspecified   . Personal history of malignant neoplasm of breast   . Pneumonia   . Recurrent upper respiratory infection (URI)   . Shortness of breath   . Spinal stenosis   . Unspecified cardiovascular disease   . Unspecified essential hypertension   . Unspecified hearing loss   . Unspecified urinary incontinence     Past Surgical History:    Procedure Laterality Date  . ABDOMINAL HYSTERECTOMY    . APPENDECTOMY  1948  . BREAST IMPLANT REMOVAL  06/12/09   right  . BREAST RECONSTRUCTION  1998   Reconstruction   . CARDIAC CATHETERIZATION N/A 01/25/2015   Procedure: Left Heart Cath and Coronary Angiography;  Surgeon: Charolette Forward, MD;  Location: [facility] CV LAB;  Service: Cardiovascular;  Laterality: N/A;  . CORONARY ANGIOPLASTY  11/12   distal RCA  . FLEXIBLE SIGMOIDOSCOPY N/A 01/21/2015   Procedure: FLEXIBLE SIGMOIDOSCOPY;  Surgeon: Richmond Campbell, MD;  Location[facility] ENDOSCOPY;  Service: Endoscopy;  Laterality: N/A;  . FLEXIBLE SIGMOIDOSCOPY  01/22/2015      . FRACTURE SURGERY     fracture right elbow  . LEFT HEART CATHETERIZATION WITH CORONARY ANGIOGRAM N/A 06/05/2011  Procedure: LEFT HEART CATHETERIZATION WITH CORONARY ANGIOGRAM;  Surgeon: Clent Demark, MD;  Location: [facility] CATH LAB;  Service: Cardiovascular;  Laterality: N/A;  . LEFT HEART CATHETERIZATION WITH CORONARY ANGIOGRAM N/A 03/05/2014   Procedure: LEFT HEART CATHETERIZATION WITH CORONARY ANGIOGRAM;  Surgeon: Clent Demark, MD;  Location: [facility] CATH LAB;  Service: Cardiovascular;  Laterality: N/A;  . LUMBAR LAMINECTOMY  2003  . MASTECTOMY  1987   Right  . MASTECTOMY    . MASTOIDECTOMY  childhood  . REDUCTION MAMMAPLASTY  1998   Left  . SHOULDER SURGERY  02/2005   Bilateral fractures with multiple surgeries    Family History  Problem Relation Age of Onset  . Heart attack Father 91  . Cancer Mother     ovarian  . Cancer Brother     lung  . Arthritis      family    Social History   Social History  . Marital status: Widowed    Spouse name: N/A  . Number of children: 2  . Years of education: N/A   Occupational History  . 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  [employer]   Social History Main Topics  . Smoking status: Former Smoker    Quit date: 07/13/1974  . Smokeless tobacco: Never Used  . Alcohol use 0.0 oz/week     Comment:  occasional  . Drug use: No  . Sexual activity: No   Other Topics Concern  . Not on file   Social History Narrative   Has living will   Son or daughter would be health care POA.   Requests DNR--written 03/02/13   No tube feeds if cognitively unaware   Review of Systems Appetite is good Weight down a few pounds Not sleeping great--- bad nights at times. Goes to recliner and reads till she gets back to sleep Bowels are okay. Takes stool softener and benefiber. No blood Voids fine. Wears depends for chronic incontinence No rash or suspicious skin lesions. Easy bruising is the same No heartburn or dysphagia--but has to be careful not to talk while chewing.     Objective:   Physical Exam  Constitutional: She is oriented to person, place, and time. She appears well-nourished. No distress.  HENT:  Mouth/Throat: Oropharynx is clear and moist. No oropharyngeal exudate.  Neck: No thyromegaly present.  Cardiovascular: Normal rate, regular rhythm and normal heart sounds.  Exam reveals no gallop.   No murmur heard. Faint pedal pulses  Pulmonary/Chest: Effort normal. No respiratory distress. She has no wheezes. She has no rales.  Decreased breath sounds but clear  Abdominal: Soft. She exhibits no distension. There is no tenderness.  Lymphadenopathy:    She has no cervical adenopathy.  Neurological: She is alert and oriented to person, place, and time.  President-- "Daisy Floro, OBama, Bush" [phone] [id number] Recall 3/3  Skin: No rash noted. No erythema.  No foot lesions          Assessment & Plan:

## 2016-09-10 NOTE — Telephone Encounter (Signed)
Spoke to pt and advised last inj 05/12/2016 and she is not due again until 11/14/2016

## 2016-09-11 DIAGNOSIS — D649 Anemia, unspecified: Secondary | ICD-10-CM | POA: Diagnosis not present

## 2016-09-11 DIAGNOSIS — E785 Hyperlipidemia, unspecified: Secondary | ICD-10-CM | POA: Diagnosis not present

## 2016-09-11 DIAGNOSIS — I2510 Atherosclerotic heart disease of native coronary artery without angina pectoris: Secondary | ICD-10-CM | POA: Diagnosis not present

## 2016-09-11 DIAGNOSIS — I639 Cerebral infarction, unspecified: Secondary | ICD-10-CM | POA: Diagnosis not present

## 2016-09-11 DIAGNOSIS — I119 Hypertensive heart disease without heart failure: Secondary | ICD-10-CM | POA: Diagnosis not present

## 2016-09-22 ENCOUNTER — Ambulatory Visit (INDEPENDENT_AMBULATORY_CARE_PROVIDER_SITE_OTHER): Payer: Medicare Other

## 2016-09-22 ENCOUNTER — Ambulatory Visit (INDEPENDENT_AMBULATORY_CARE_PROVIDER_SITE_OTHER): Payer: Medicare Other | Admitting: Orthopedic Surgery

## 2016-09-22 ENCOUNTER — Encounter (INDEPENDENT_AMBULATORY_CARE_PROVIDER_SITE_OTHER): Payer: Self-pay | Admitting: Orthopedic Surgery

## 2016-09-22 VITALS — Ht 65.0 in | Wt 178.0 lb

## 2016-09-22 DIAGNOSIS — M84374D Stress fracture, right foot, subsequent encounter for fracture with routine healing: Secondary | ICD-10-CM | POA: Diagnosis not present

## 2016-09-22 DIAGNOSIS — M79671 Pain in right foot: Secondary | ICD-10-CM

## 2016-09-22 NOTE — Progress Notes (Signed)
Office Visit Note   Patient: Haley Harris           Date of Birth: [birth year]           MRN: [id number] Visit Date: 09/22/2016              Requested by: Venia Carbon, MD [place], [place] [id number] PCP: [name], MD  Chief Complaint  Patient presents with  . Right Foot - Follow-up     follow up of 2nd metatarsal stress fracture    HPI: Patient is here for follow up of her right foot second metatarsal stress fracture. The pt ambulates with a rolling walker and a post op shoe. She states that her foot is still painful but not as bad as it had been at her last visit. Patient does not have any questions or concerns today. Pamella Pert, RMA    Assessment & Plan: Visit Diagnoses:  1. Stress fracture of metatarsal bone of right foot with routine healing, subsequent encounter   2. Pain in right foot     Plan: Patient will wear her postoperative shoe and advance to regular shoe wear when she is asymptomatic. She would like to follow-up as needed. Discussed that if her symptoms change or she has increased pain to call and we will repeat 3 view radiographs of the right foot.  Follow-Up Instructions: Return if symptoms worsen or fail to improve.   Ortho Exam On examination patient is alert oriented no adenopathy well-dressed normal affect normal respiratory effort she does have an antalgic gait she is a rolling walker for ambulation she is a good dorsalis pedis pulse she is point tender to palpation along the second metatarsal. Patient has pain with distraction across the base of the second metatarsal ROS: Complete review of systems negative except as mentioned in history of present illness. Imaging: Xr Foot Complete Right  Result Date: 09/22/2016 Three-view radiographs the right foot shows increased sclerosis of the second metatarsal there is healing fractures of the fourth and fifth metatarsal with a long second and third metatarsal with most the stress  of weightbearing on the second metatarsal head.   Labs: Lab Results  Component Value Date   HGBA1C 6.0 (H) 09/27/2015   REPTSTATUS 11/01/2015 FINAL 10/29/2015   GRAMSTAIN  06/19/2009    MODERATE WBC PRESENT, PREDOMINANTLY PMN RARE SQUAMOUS EPITHELIAL CELLS PRESENT MODERATE GRAM POSITIVE COCCI IN PAIRS RARE GRAM NEGATIVE COCCI RARE GRAM NEGATIVE RODS   CULT MULTIPLE SPECIES PRESENT, SUGGEST RECOLLECTION 10/29/2015   LABORGA Multiple bacterial morphotypes present, none 10/23/2011   LABORGA predominant. Suggest appropriate recollection if  10/23/2011   LABORGA clinically indicated. 10/23/2011    Orders:  Orders Placed This Encounter  Procedures  . XR Foot Complete Right   No orders of the defined types were placed in this encounter.    Procedures: No procedures performed  Clinical Data: No additional findings.  Subjective: Review of Systems  Objective: Vital Signs: Ht 5\' 5"  (1.651 m)   Wt 178 lb (80.7 kg)   BMI 29.62 kg/m   Specialty Comments:  No specialty comments available.  PMFS History: Patient Active Problem List   Diagnosis Date Noted  . Metatarsal stress fracture with routine healing 08/04/2016  . Cervical spondylosis without myelopathy 07/31/2016  . Primary osteoarthritis of both first carpometacarpal joints 07/31/2016  . Acute on chronic combined systolic and diastolic heart failure ([place]) 07/22/2016  . Spinal stenosis in cervical region 05/29/2016  .  Venous stasis dermatitis 11/13/2015  . Essential hypertension 10/30/2015  . Diverticulitis of colon 02/18/2015  . Chronic combined systolic and diastolic CHF (congestive heart failure) ([place])   . Chronic kidney disease, stage III (moderate)   . PVD (peripheral vascular disease) ([place])   . Right adrenal mass ([place]) 01/18/2015  . Chronic venous insufficiency 12/18/2014  . Advanced directives, counseling/discussion 04/02/2014  . Neuropathy due to peripheral vascular disease ([place]) 03/11/2014  . Chronic  combined systolic and diastolic CHF, NYHA class 1 ([place]) 03/06/2014  . Routine general medical examination at a health care facility 03/02/2013  . Constipation due to pain medication 10/28/2011  . CAD (coronary artery disease), native coronary artery 06/05/2011  . COPD exacerbation ([place]) 10/02/2010  . Dyslipidemia 10/30/2006  . Episodic mood disorder ([place]) 10/30/2006  . COPD (chronic obstructive pulmonary disease) ([place]) 10/30/2006  . GERD 10/30/2006  . Osteoarthritis 10/30/2006  . Osteoporosis 10/30/2006  . URINARY INCONTINENCE 10/30/2006   Past Medical History:  Diagnosis Date  . ACE-inhibitor cough   . Allergic rhinitis, cause unspecified   . Angina   . Anxiety   . CHF (congestive heart failure) ([place])   . Chronic airway obstruction, not elsewhere classified   . Compression fracture of T12 vertebra ([place]) 10/28/2011  . Depressive disorder, not elsewhere classified   . Esophageal reflux   . Heart murmur    aS CHILD  . Malignant neoplasm of breast (female), unspecified site   . Myocardial infarct   . Neuromuscular disorder ([place])    NEROPATHY FROM STENOSIS  . Occlusion and stenosis of carotid artery without mention of cerebral infarction   . Osteoarthrosis, unspecified whether generalized or localized, unspecified site   . Osteoporosis, unspecified   . Other and unspecified hyperlipidemia   . Other dyspnea and respiratory abnormality   . Peripheral vascular disease, unspecified   . Personal history of malignant neoplasm of breast   . Pneumonia   . Recurrent upper respiratory infection (URI)   . Shortness of breath   . Spinal stenosis   . Unspecified cardiovascular disease   . Unspecified essential hypertension   . Unspecified hearing loss   . Unspecified urinary incontinence     Family History  Problem Relation Age of Onset  . Heart attack Father 13  . Cancer Mother     ovarian  . Cancer Brother     lung  . Arthritis      family    Past Surgical History:  Procedure  Laterality Date  . ABDOMINAL HYSTERECTOMY    . APPENDECTOMY  1948  . BREAST IMPLANT REMOVAL  06/12/09   right  . BREAST RECONSTRUCTION  1998   Reconstruction   . CARDIAC CATHETERIZATION N/A 01/25/2015   Procedure: Left Heart Cath and Coronary Angiography;  Surgeon: Charolette Forward, MD;  Location: [facility];  Service: Cardiovascular;  Laterality: N/A;  . CORONARY ANGIOPLASTY  11/12   distal RCA  . FLEXIBLE SIGMOIDOSCOPY N/A 01/21/2015   Procedure: FLEXIBLE SIGMOIDOSCOPY;  Surgeon: Richmond Campbell, MD;  Location: [facility] ENDOSCOPY;  Service: Endoscopy;  Laterality: N/A;  . FLEXIBLE SIGMOIDOSCOPY  01/22/2015      . FRACTURE SURGERY     fracture right elbow  . LEFT HEART CATHETERIZATION WITH CORONARY ANGIOGRAM N/A 06/05/2011   Procedure: LEFT HEART CATHETERIZATION WITH CORONARY ANGIOGRAM;  Surgeon: Clent Demark, MD;  Location: [facility] CATH LAB;  Service: Cardiovascular;  Laterality: N/A;  . LEFT HEART CATHETERIZATION WITH CORONARY ANGIOGRAM N/A 03/05/2014   Procedure: LEFT HEART CATHETERIZATION WITH  CORONARY ANGIOGRAM;  Surgeon: Clent Demark, MD;  Location: [facility] CATH LAB;  Service: Cardiovascular;  Laterality: N/A;  . LUMBAR LAMINECTOMY  2003  . MASTECTOMY  1987   Right  . MASTECTOMY    . MASTOIDECTOMY  childhood  . REDUCTION MAMMAPLASTY  1998   Left  . SHOULDER SURGERY  02/2005   Bilateral fractures with multiple surgeries   Social History   Occupational History  . 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  [employer]   Social History Main Topics  . Smoking status: Former Smoker    Quit date: 07/13/1974  . Smokeless tobacco: Never Used  . Alcohol use 0.0 oz/week     Comment: occasional  . Drug use: No  . Sexual activity: No

## 2016-09-23 ENCOUNTER — Ambulatory Visit (INDEPENDENT_AMBULATORY_CARE_PROVIDER_SITE_OTHER): Payer: Self-pay | Admitting: Specialist

## 2016-09-28 ENCOUNTER — Encounter: Payer: Medicare Other | Attending: Physical Medicine & Rehabilitation

## 2016-09-28 ENCOUNTER — Ambulatory Visit (HOSPITAL_BASED_OUTPATIENT_CLINIC_OR_DEPARTMENT_OTHER): Payer: Medicare Other | Admitting: Physical Medicine & Rehabilitation

## 2016-09-28 ENCOUNTER — Encounter: Payer: Self-pay | Admitting: Physical Medicine & Rehabilitation

## 2016-09-28 VITALS — BP 127/73 | HR 76 | Resp 14

## 2016-09-28 DIAGNOSIS — M25511 Pain in right shoulder: Secondary | ICD-10-CM | POA: Diagnosis not present

## 2016-09-28 DIAGNOSIS — M1612 Unilateral primary osteoarthritis, left hip: Secondary | ICD-10-CM

## 2016-09-28 DIAGNOSIS — M25551 Pain in right hip: Secondary | ICD-10-CM | POA: Diagnosis not present

## 2016-09-28 DIAGNOSIS — G8929 Other chronic pain: Secondary | ICD-10-CM | POA: Insufficient documentation

## 2016-09-28 DIAGNOSIS — M19041 Primary osteoarthritis, right hand: Secondary | ICD-10-CM | POA: Insufficient documentation

## 2016-09-28 DIAGNOSIS — M549 Dorsalgia, unspecified: Secondary | ICD-10-CM | POA: Insufficient documentation

## 2016-09-28 DIAGNOSIS — Z5181 Encounter for therapeutic drug level monitoring: Secondary | ICD-10-CM

## 2016-09-28 DIAGNOSIS — M47812 Spondylosis without myelopathy or radiculopathy, cervical region: Secondary | ICD-10-CM | POA: Diagnosis not present

## 2016-09-28 DIAGNOSIS — M25562 Pain in left knee: Secondary | ICD-10-CM | POA: Diagnosis not present

## 2016-09-28 DIAGNOSIS — M25552 Pain in left hip: Secondary | ICD-10-CM | POA: Diagnosis not present

## 2016-09-28 DIAGNOSIS — Z79899 Other long term (current) drug therapy: Secondary | ICD-10-CM | POA: Diagnosis not present

## 2016-09-28 DIAGNOSIS — M4802 Spinal stenosis, cervical region: Secondary | ICD-10-CM

## 2016-09-28 DIAGNOSIS — M25561 Pain in right knee: Secondary | ICD-10-CM | POA: Insufficient documentation

## 2016-09-28 DIAGNOSIS — M19042 Primary osteoarthritis, left hand: Secondary | ICD-10-CM | POA: Diagnosis not present

## 2016-09-28 DIAGNOSIS — M542 Cervicalgia: Secondary | ICD-10-CM | POA: Diagnosis not present

## 2016-09-28 DIAGNOSIS — M1610 Unilateral primary osteoarthritis, unspecified hip: Secondary | ICD-10-CM | POA: Diagnosis not present

## 2016-09-28 DIAGNOSIS — G894 Chronic pain syndrome: Secondary | ICD-10-CM | POA: Diagnosis not present

## 2016-09-28 MED ORDER — OXYCODONE HCL 5 MG PO TABS: 5.0000 mg | ORAL_TABLET | Freq: Two times a day (BID) | ORAL | 0 refills | Status: DC

## 2016-09-28 NOTE — Progress Notes (Signed)
Subjective:    Patient ID: Haley Harris, female    DOB: [birth year], 83 y.o.   MRN: [id number]  HPI Left C4-5 MBB pt felt it was the right area of pain, 06/11/2016 good relief for 1 wk Repeat injection 2/19, 0% relief,Same area of neck pain was addressed. Neck is still most tender Uses ice not heating Uses essential oils  Peppermint, eucalyptus, wintergreen , camphor, cammomile and other oils   Patient indicates that she had good relief with left hip intra-articular injection done under fluoroscopic guidance by Dr. Ernestina Patches. Her knee pain was relieved as well  Has already tried left knee injections including Synvisc, and corticosteroids without much relief. Pain Inventory Average Pain 7 Pain Right Now 7 My pain is sharp, stabbing and aching  In the last 24 hours, has pain interfered with the following? General activity 7 Relation with others 7 Enjoyment of life 7 What TIME of day is your pain at its worst? all Sleep (in general) NA  Pain is worse with: some activites Pain improves with: medication Relief from Meds: no answer  Mobility walk with assistance use a walker how many minutes can you walk? 2-3 ability to climb steps?  yes do you drive?  no  Function retired I need assistance with the following:  meal prep, household duties and shopping  Neuro/Psych bladder control problems numbness  Prior Studies Any changes since last visit?  no  Physicians involved in your care Any changes since last visit?  no   Family History  Problem Relation Age of Onset  . Heart attack Father 75  . Cancer Mother     ovarian  . Cancer Brother     lung  . Arthritis      family   Social History   Social History  . Marital status: Widowed    Spouse name: N/A  . Number of children: 2  . Years of education: N/A   Occupational History  . 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  [employer]   Social History Main Topics  . Smoking status: Former Smoker   Quit date: 07/13/1974  . Smokeless tobacco: Never Used  . Alcohol use 0.0 oz/week     Comment: occasional  . Drug use: No  . Sexual activity: No   Other Topics Concern  . None   Social History Narrative   Has living will   Son or daughter would be health care POA.   Requests DNR--written 03/02/13   No tube feeds if cognitively unaware   Past Surgical History:  Procedure Laterality Date  . ABDOMINAL HYSTERECTOMY    . APPENDECTOMY  1948  . BREAST IMPLANT REMOVAL  06/12/09   right  . BREAST RECONSTRUCTION  1998   Reconstruction   . CARDIAC CATHETERIZATION N/A 01/25/2015   Procedure: Left Heart Cath and Coronary Angiography;  Surgeon: Charolette Forward, MD;  Location: [place] [facility] LAB;  Service: Cardiovascular;  Laterality: N/A;  . CORONARY ANGIOPLASTY  11/12   distal RCA  . FLEXIBLE SIGMOIDOSCOPY N/A 01/21/2015   Procedure: FLEXIBLE SIGMOIDOSCOPY;  Surgeon: Richmond Campbell, MD;  Location: [facility] ENDOSCOPY;  Service: Endoscopy;  Laterality: N/A;  . FLEXIBLE SIGMOIDOSCOPY  01/22/2015      . FRACTURE SURGERY     fracture right elbow  . LEFT HEART CATHETERIZATION WITH CORONARY ANGIOGRAM N/A 06/05/2011   Procedure: LEFT HEART CATHETERIZATION WITH CORONARY ANGIOGRAM;  Surgeon: Clent Demark, MD;  Location: [facility] CATH LAB;  Service: Cardiovascular;  Laterality: N/A;  . LEFT HEART CATHETERIZATION WITH CORONARY ANGIOGRAM N/A 03/05/2014   Procedure: LEFT HEART CATHETERIZATION WITH CORONARY ANGIOGRAM;  Surgeon: Clent Demark, MD;  Location: [facility] CATH LAB;  Service: Cardiovascular;  Laterality: N/A;  . LUMBAR LAMINECTOMY  2003  . MASTECTOMY  1987   Right  . MASTECTOMY    . MASTOIDECTOMY  childhood  . REDUCTION MAMMAPLASTY  1998   Left  . SHOULDER SURGERY  02/2005   Bilateral fractures with multiple surgeries   Past Medical History:  Diagnosis Date  . ACE-inhibitor cough   . Allergic rhinitis, cause unspecified   . Angina   . Anxiety   . CHF (congestive heart failure) ([place])   . Chronic airway  obstruction, not elsewhere classified   . Compression fracture of T12 vertebra ([place]) 10/28/2011  . Depressive disorder, not elsewhere classified   . Esophageal reflux   . Heart murmur    aS CHILD  . Malignant neoplasm of breast (female), unspecified site   . Myocardial infarct   . Neuromuscular disorder ([place])    NEROPATHY FROM STENOSIS  . Occlusion and stenosis of carotid artery without mention of cerebral infarction   . Osteoarthrosis, unspecified whether generalized or localized, unspecified site   . Osteoporosis, unspecified   . Other and unspecified hyperlipidemia   . Other dyspnea and respiratory abnormality   . Peripheral vascular disease, unspecified   . Personal history of malignant neoplasm of breast   . Pneumonia   . Recurrent upper respiratory infection (URI)   . Shortness of breath   . Spinal stenosis   . Unspecified cardiovascular disease   . Unspecified essential hypertension   . Unspecified hearing loss   . Unspecified urinary incontinence    BP 127/73   Pulse 76   Resp 14   SpO2 (!) 89%   Opioid Risk Score:   Fall Risk Score:  `1  Depression screen PHQ 2/9  Depression screen [facility] 2/9 09/28/2016 05/29/2016 10/22/2015 03/02/2013  Decreased Interest 1 1 1  0  Down, Depressed, Hopeless 1 1 1  0  PHQ - 2 Score 2 2 2  0  Altered sleeping - 1 0 -  Tired, decreased energy - 1 1 -  Change in appetite - 1 1 -  Feeling bad or failure about yourself  - 2 0 -  Trouble concentrating - 0 0 -  Moving slowly or fidgety/restless - 1 0 -  Suicidal thoughts - 0 0 -  PHQ-9 Score - 8 4 -  Difficult doing work/chores - Somewhat difficult Not difficult at all -  Some recent data might be hidden    Review of Systems  Constitutional: Positive for appetite change.  HENT: Negative.   Eyes: Negative.   Respiratory: Positive for cough, shortness of breath and wheezing.   Cardiovascular: Negative.   Gastrointestinal: Positive for constipation.  Endocrine: Negative.     Genitourinary: Negative.        Bladder control  Musculoskeletal: Negative.   Skin: Negative.   Allergic/Immunologic: Negative.   Neurological: Positive for numbness.  Hematological: Negative.   Psychiatric/Behavioral: Negative.   All other systems reviewed and are negative.      Objective:   Physical Exam  Constitutional: She is oriented to person, place, and time. She appears well-developed and well-nourished.  HENT:  Head: Normocephalic and atraumatic.  Eyes: Conjunctivae and EOM are normal. Pupils are equal, round, and reactive to light.  Neck: Normal range of motion.  Musculoskeletal:       Cervical  back: She exhibits decreased range of motion and deformity.       Thoracic back: She exhibits decreased range of motion and deformity.  Head forward posture No tenderness along the trapezius along the levator scapula along the lateral masses. No pain with shoulder range of motion. Negative impingement signs bilaterally. Thoracic kyphosis  Left knee pain with range of motion, mainly flexion, has full extension, limited flexion to approximately 90.   Neurological: She is alert and oriented to person, place, and time.  Psychiatric: She has a normal mood and affect.  Nursing note and vitals reviewed.         Assessment & Plan:  1. Cervicalgia with degenerative disc disease, cervical spondylosis, no evidence of myelopathy. Her pain is exacerbated by poor posture   The [place] control substances reporting system was reviewed. There currently is no evidence of multiple prescribers for opiates and other controlled substances. Patients on chronic opioids will have this reviewed at minimum every 3 months. Continue opioid monitoring program. This consists of regular clinic visits, examinations, urine drug screen, pill counts as well as use of [place] controlled substance reporting System. Checking urine drug screen today  2. Left hip pain, DJD, patient does not  feel like she needs another injection. Yet, but will discuss with nurse practitioner next month  Chronic multifactorial pain, she has polyarthralgias. Continue oxycodone 5 mg twice a day   nonpharmaceutical options discussed including TENS, acupuncture, topical agents

## 2016-09-28 NOTE — Patient Instructions (Signed)
Non medication pain relief  Try ice 20min alternating with heating pad for 20 minutes every 2 hours as needed  Try TENs unit that can be puchased in a pharmacy for about 40.00, brands include Aleve or Icy Hot  Try various muscle cremes  Aspercreme or others that contain trolamine- this is anti inflammatory  Capsaicin creme which reduces Pain substance P  Lidocaine containing creme which has a numbing medicine  Methol and camphor containing creme which has a cooling effect 

## 2016-10-01 LAB — TOXASSURE SELECT,+ANTIDEPR,UR

## 2016-10-05 ENCOUNTER — Ambulatory Visit (INDEPENDENT_AMBULATORY_CARE_PROVIDER_SITE_OTHER): Payer: Medicare Other | Admitting: Podiatry

## 2016-10-05 VITALS — BP 120/76 | HR 76 | Resp 18

## 2016-10-05 DIAGNOSIS — M79609 Pain in unspecified limb: Secondary | ICD-10-CM

## 2016-10-05 DIAGNOSIS — B351 Tinea unguium: Secondary | ICD-10-CM | POA: Diagnosis not present

## 2016-10-05 NOTE — Progress Notes (Signed)
Complaint:  Visit Type: Patient returns to my office for continued preventative foot care services. Complaint: Patient states" my nails have grown long and thick and become painful to walk and wear shoes". The patient presents for preventative foot care services. No changes to ROS.  Patient says she is healing from stress fracture right foot treated by another doctor.  Podiatric Exam: Vascular: dorsalis pedis and posterior tibial pulses are palpable bilateral. Capillary return is immediate. Temperature gradient is WNL. Skin turgor WNL  Sensorium: Normal Semmes Weinstein monofilament test. Normal tactile sensation bilaterally. Nail Exam: Pt has thick disfigured discolored nails with subungual debris noted bilateral entire nail hallux through fifth toenails Ulcer Exam: There is no evidence of ulcer or pre-ulcerative changes or infection. Orthopedic Exam: Muscle tone and strength are WNL. No limitations in general ROM. No crepitus or effusions noted. Foot type and digits show no abnormalities. Bony prominences are unremarkable. Skin: No Porokeratosis. No infection or ulcers  Diagnosis:  Onychomycosis, , Pain in right toe, pain in left toes  Treatment & Plan Procedures and Treatment: Consent by patient was obtained for treatment procedures. The patient understood the discussion of treatment and procedures well. All questions were answered thoroughly reviewed. Debridement of mycotic and hypertrophic toenails, 1 through 5 bilateral and clearing of subungual debris. No ulceration, no infection noted.  Return Visit-Office Procedure: Patient instructed to return to the office for a follow up visit 3 months for continued evaluation and treatment.    Gardiner Barefoot DPM

## 2016-10-14 ENCOUNTER — Telehealth: Payer: Self-pay | Admitting: *Deleted

## 2016-10-14 NOTE — Telephone Encounter (Signed)
Thank You.

## 2016-10-14 NOTE — Telephone Encounter (Signed)
Information has been submitted to pts insurance for verification of benefits. Awaiting response for coverage  

## 2016-10-23 ENCOUNTER — Other Ambulatory Visit: Payer: Self-pay | Admitting: Internal Medicine

## 2016-10-26 ENCOUNTER — Encounter: Payer: Self-pay | Admitting: Registered Nurse

## 2016-10-26 ENCOUNTER — Telehealth: Payer: Self-pay | Admitting: Registered Nurse

## 2016-10-26 ENCOUNTER — Encounter: Payer: Medicare Other | Attending: Physical Medicine & Rehabilitation | Admitting: Registered Nurse

## 2016-10-26 VITALS — BP 94/58 | HR 60

## 2016-10-26 DIAGNOSIS — M4802 Spinal stenosis, cervical region: Secondary | ICD-10-CM | POA: Diagnosis not present

## 2016-10-26 DIAGNOSIS — M549 Dorsalgia, unspecified: Secondary | ICD-10-CM | POA: Insufficient documentation

## 2016-10-26 DIAGNOSIS — M2550 Pain in unspecified joint: Secondary | ICD-10-CM | POA: Diagnosis not present

## 2016-10-26 DIAGNOSIS — G894 Chronic pain syndrome: Secondary | ICD-10-CM

## 2016-10-26 DIAGNOSIS — Z79899 Other long term (current) drug therapy: Secondary | ICD-10-CM | POA: Diagnosis not present

## 2016-10-26 DIAGNOSIS — M19041 Primary osteoarthritis, right hand: Secondary | ICD-10-CM | POA: Insufficient documentation

## 2016-10-26 DIAGNOSIS — M1610 Unilateral primary osteoarthritis, unspecified hip: Secondary | ICD-10-CM | POA: Diagnosis not present

## 2016-10-26 DIAGNOSIS — M25551 Pain in right hip: Secondary | ICD-10-CM | POA: Diagnosis not present

## 2016-10-26 DIAGNOSIS — G8929 Other chronic pain: Secondary | ICD-10-CM | POA: Diagnosis not present

## 2016-10-26 DIAGNOSIS — M25561 Pain in right knee: Secondary | ICD-10-CM | POA: Diagnosis not present

## 2016-10-26 DIAGNOSIS — M47812 Spondylosis without myelopathy or radiculopathy, cervical region: Secondary | ICD-10-CM

## 2016-10-26 DIAGNOSIS — Z5181 Encounter for therapeutic drug level monitoring: Secondary | ICD-10-CM

## 2016-10-26 DIAGNOSIS — M25511 Pain in right shoulder: Secondary | ICD-10-CM | POA: Insufficient documentation

## 2016-10-26 DIAGNOSIS — M25562 Pain in left knee: Secondary | ICD-10-CM | POA: Diagnosis not present

## 2016-10-26 DIAGNOSIS — M170 Bilateral primary osteoarthritis of knee: Secondary | ICD-10-CM

## 2016-10-26 DIAGNOSIS — M19042 Primary osteoarthritis, left hand: Secondary | ICD-10-CM | POA: Insufficient documentation

## 2016-10-26 DIAGNOSIS — M25552 Pain in left hip: Secondary | ICD-10-CM | POA: Diagnosis not present

## 2016-10-26 DIAGNOSIS — M1612 Unilateral primary osteoarthritis, left hip: Secondary | ICD-10-CM | POA: Diagnosis not present

## 2016-10-26 DIAGNOSIS — M542 Cervicalgia: Secondary | ICD-10-CM | POA: Insufficient documentation

## 2016-10-26 MED ORDER — OXYCODONE HCL 5 MG PO TABS: 5.0000 mg | ORAL_TABLET | Freq: Two times a day (BID) | ORAL | 0 refills | Status: DC | PRN

## 2016-10-26 MED ORDER — BUPRENORPHINE 10 MCG/HR TD PTWK: 10.0000 ug | MEDICATED_PATCH | TRANSDERMAL | 2 refills | Status: DC

## 2016-10-26 NOTE — Telephone Encounter (Signed)
Haley Harris had a UDS performed on 09/28/2016, it was consistent.

## 2016-10-26 NOTE — Progress Notes (Signed)
Subjective:    Patient ID: Haley Harris, female    DOB: [birth year], 83 y.o.   MRN: [id number]  HPI: Haley Harris is a 83 year old female who returns for follow up appointment and medication refill. She states her pain is located in her neck, lower back, bilateral hips L>R and bilateral knees. Also states she has generalized pain all over.  [name] also stated she's having financial difficulties, she has several specialist and her co-pay's can run up to $120.00 month. We discussed other options. She is in agreement with changing to [facility], discussed with Dr. Letta Pate and he agrees with plan.  Epic flagged allergy with Butrans/ Tramadol,  [name] stated she had a UTI and she remember she had some confusion, at the time, the provider placed Tramadol on her allergy list, she states. Since that time she has been prescribed hydrocodone and Oxycodone in the past without any adverse reactions. Placed a call to  [place] spoke to  Audrea Muscat the pharmacists, she stated the Butrans shouldn't cause any reaction. [name] instructed to call office if any reaction occurs, she verbalizes understanding.  [name] will have Dr. Ernestina Patches performed the left hip injection due to cost she states.   Last UDS was on 09/28/2016, it was consistent.   Pain Inventory Average Pain 7 Pain Right Now 7 My pain is sharp, stabbing and aching  In the last 24 hours, has pain interfered with the following? General activity 3 Relation with others 2 Enjoyment of life 3 What TIME of day is your pain at its worst? all Sleep (in general) Fair  Pain is worse with: walking, bending, standing and some activites Pain improves with: rest, heat/ice, therapy/exercise and medication Relief from Meds: 5  Mobility use a walker how many minutes can you walk? 2-3 ability to climb steps?  yes do you drive?  no  Function retired  Neuro/Psych numbness  Prior Studies Any changes since last visit?   no  Physicians involved in your care Any changes since last visit?  no   Family History  Problem Relation Age of Onset  . Heart attack Father 51  . Cancer Mother     ovarian  . Cancer Brother     lung  . Arthritis      family   Social History   Social History  . Marital status: Widowed    Spouse name: N/A  . Number of children: 2  . Years of education: N/A   Occupational History  . 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  [employer]   Social History Main Topics  . Smoking status: Former Smoker    Quit date: 07/13/1974  . Smokeless tobacco: Never Used  . Alcohol use 0.0 oz/week     Comment: occasional  . Drug use: No  . Sexual activity: No   Other Topics Concern  . None   Social History Narrative   Has living will   Son or daughter would be health care POA.   Requests DNR--written 03/02/13   No tube feeds if cognitively unaware   Past Surgical History:  Procedure Laterality Date  . ABDOMINAL HYSTERECTOMY    . APPENDECTOMY  1948  . BREAST IMPLANT REMOVAL  06/12/09   right  . BREAST RECONSTRUCTION  1998   Reconstruction   . CARDIAC CATHETERIZATION N/A 01/25/2015   Procedure: Left Heart Cath and Coronary Angiography;  Surgeon: Charolette Forward, MD;  Location: [place] CV LAB;  Service: Cardiovascular;  Laterality: N/A;  . CORONARY ANGIOPLASTY  11/12   distal RCA  . FLEXIBLE SIGMOIDOSCOPY N/A 01/21/2015   Procedure: FLEXIBLE SIGMOIDOSCOPY;  Surgeon: Richmond Campbell, MD;  Location: [facility] ENDOSCOPY;  Service: Endoscopy;  Laterality: N/A;  . FLEXIBLE SIGMOIDOSCOPY  01/22/2015      . FRACTURE SURGERY     fracture right elbow  . LEFT HEART CATHETERIZATION WITH CORONARY ANGIOGRAM N/A 06/05/2011   Procedure: LEFT HEART CATHETERIZATION WITH CORONARY ANGIOGRAM;  Surgeon: Clent Demark, MD;  Location: [facility] CATH LAB;  Service: Cardiovascular;  Laterality: N/A;  . LEFT HEART CATHETERIZATION WITH CORONARY ANGIOGRAM N/A 03/05/2014   Procedure: LEFT HEART  CATHETERIZATION WITH CORONARY ANGIOGRAM;  Surgeon: Clent Demark, MD;  Location: [place] CATH LAB;  Service: Cardiovascular;  Laterality: N/A;  . LUMBAR LAMINECTOMY  2003  . MASTECTOMY  1987   Right  . MASTECTOMY    . MASTOIDECTOMY  childhood  . REDUCTION MAMMAPLASTY  1998   Left  . SHOULDER SURGERY  02/2005   Bilateral fractures with multiple surgeries   Past Medical History:  Diagnosis Date  . ACE-inhibitor cough   . Allergic rhinitis, cause unspecified   . Angina   . Anxiety   . CHF (congestive heart failure) ([place])   . Chronic airway obstruction, not elsewhere classified   . Compression fracture of T12 vertebra ([place]) 10/28/2011  . Depressive disorder, not elsewhere classified   . Esophageal reflux   . Heart murmur    aS CHILD  . Malignant neoplasm of breast (female), unspecified site   . Myocardial infarct ([place])   . 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 Occlusion and stenosis of carotid artery without mention of cerebral infarction   . Osteoarthrosis, unspecified whether generalized or localized, unspecified site   . Osteoporosis, unspecified   . Other and unspecified hyperlipidemia   . Other dyspnea and respiratory abnormality   . Peripheral vascular disease, unspecified ([place])   . Personal history of malignant neoplasm of breast   . Pneumonia   . Recurrent upper respiratory infection (URI)   . Shortness of breath   . Spinal stenosis   . Unspecified cardiovascular disease   . Unspecified essential hypertension   . Unspecified hearing loss   . Unspecified urinary incontinence    BP (!) 94/58   Pulse 60   SpO2 90%   Opioid Risk Score:   Fall Risk Score:  `1  Depression screen PHQ 2/9  Depression screen [facility] 2/9 10/26/2016 09/28/2016 05/29/2016 10/22/2015 03/02/2013  Decreased Interest 1 1 1 1  0  Down, Depressed, Hopeless 1 1 1 1  0  PHQ - 2 Score 2 2 2 2  0  Altered sleeping - - 1 0 -  Tired, decreased energy - - 1 1 -  Change in appetite - - 1 1 -   Feeling bad or failure about yourself  - - 2 0 -  Trouble concentrating - - 0 0 -  Moving slowly or fidgety/restless - - 1 0 -  Suicidal thoughts - - 0 0 -  PHQ-9 Score - - 8 4 -  Difficult doing work/chores - - Somewhat difficult Not difficult at all -  Some recent data might be hidden    Review of Systems  Constitutional: Negative.   HENT: Negative.   Eyes: Negative.   Respiratory: Negative.   Cardiovascular: Negative.   Gastrointestinal: Negative.   Endocrine: Negative.   Genitourinary: Negative.  Musculoskeletal: Negative.   Skin: Negative.   Allergic/Immunologic: Negative.   Neurological: Positive for numbness.  Hematological: Negative.   Psychiatric/Behavioral: Negative.   All other systems reviewed and are negative.      Objective:   Physical Exam  Constitutional: She is oriented to person, place, and time. She appears well-developed and well-nourished.  HENT:  Head: Normocephalic and atraumatic.  Neck: Normal range of motion. Neck supple.  Cardiovascular: Normal rate and regular rhythm.   Pulmonary/Chest: Effort normal and breath sounds normal.  Musculoskeletal:  Normal Muscle Bulk and Muscle Testing Reveals: Upper Extremities: Full ROM and Muscle Strength 5/5 Bilateral AC Joint Tenderness Lumbar Paraspinal Tenderness: L-3-L-5 Bilateral Greater Trochanter Tenderness: L>R Lower Extremities: Full ROM and Muscle Strength 5/5 Bilateral Lower Extremity Flexion Produces Pain into Bilateral Patella's Arises from Table slowly using walker for support Narrow Based Gait  Neurological: She is alert and oriented to person, place, and time.  Skin: Skin is warm and dry.  Psychiatric: She has a normal mood and affect.  Nursing note and vitals reviewed.         Assessment & Plan:  1. Cervicalgia with degenerative disc disease, cervical spondylosis, no evidence of myelopathy. Her pain is exacerbated by poor posture. 2. Left hip pain, DJD: She will have her hip  injection with Dr. Ernestina Patches due to cost, she states 3. Chronic multifactorial pain, she has polyarthralgias. She will finish the remaining oxycodone 5 mg twice a day.  RX: Butrans 10 mcg one patch weekly #4.  4. Bilateral OA in Both Knees: Continue current medication regime, HEP as tolerated.  We will continue the opioid monitoring program, this consists of regular clinic visits, examinations, urine drug screen, pill counts as well as use of [place] Controlled Substance Reporting System.   F/U in 3 months

## 2016-10-26 NOTE — Telephone Encounter (Signed)
On 10/26/2016 the [place] was reviewed no conflict was seen on the Door with multiple prescribers. Haley Harris has a signed narcotic contract with our office. If there were any discrepancies this would have been reported to her physician.

## 2016-10-26 NOTE — Telephone Encounter (Signed)
Haley Harris called stating, she would like to resume the Oxycodone. She has a history of COPD, Butrans can cause respiratory depression. She was instructed to return the [facility] prescription, and Oxycodone prescription printed. She verbalizes understanding.

## 2016-10-27 ENCOUNTER — Telehealth: Payer: Self-pay | Admitting: Registered Nurse

## 2016-10-27 ENCOUNTER — Other Ambulatory Visit: Payer: Self-pay

## 2016-10-27 MED ORDER — ESCITALOPRAM OXALATE 10 MG PO TABS: 10.0000 mg | ORAL_TABLET | Freq: Every day | ORAL | 0 refills | Status: DC

## 2016-10-27 NOTE — Telephone Encounter (Signed)
Placed a call to Haley Harris, spoke with Dr. Letta Pate regarding her daughter concerns about Butrans. Dr. Letta Pate states Haley Harris would be safe for Haley Harris , left message. Await a return call.

## 2016-10-27 NOTE — Telephone Encounter (Signed)
[place] (DPR signed) said that pt is waiting on lexapro refill from [facility] and pt is out of med and crying and request small qty sent to [facility]; advised pts daughter done.

## 2016-10-28 ENCOUNTER — Telehealth: Payer: Self-pay | Admitting: *Deleted

## 2016-10-28 ENCOUNTER — Telehealth: Payer: Self-pay | Admitting: Physical Medicine & Rehabilitation

## 2016-10-28 NOTE — Telephone Encounter (Signed)
Patient is returning Haley Harris phone call. °

## 2016-10-28 NOTE — Telephone Encounter (Signed)
ADDENDUM to 05/29/16 lab test. Urine drug screen for this 05/29/16 encounter was Inconsistent due to presence of alcohol in urine. This message is being added due to result note place on the [employer] Plus test by Dr Letta Pate 10/01/16 stating it was her second + alcohol in her urine and to make non narcotic. This was in error as it was only the first uds dated 05/29/16 that was positive . The most recent test 09/28/16 was appropriately negative for alcohol. (She was counseled at her December 2017 visit about not combining alcohol with narcotics.)

## 2016-10-28 NOTE — Telephone Encounter (Signed)
Return Ms. Dworkin call, no answer. Left message to return the call.

## 2016-10-29 NOTE — Telephone Encounter (Signed)
Called patient and let her know that the priscription is ready at the front for her

## 2016-11-03 NOTE — Telephone Encounter (Signed)
Verification of benefits have been processed and an approval has been received for pts prolia injection. Pts estimated cost are appx $250. This is only an estimate and cannot be confirmed until benefits are paid. Please advise pt and schedule if needed. If scheduled, once the injection is received, pls contact me back with the date it was received so that I am able to update prolia folder. thanks    [place] to proceed with Ca lab and injection can be scheduled after 11/10/16

## 2016-11-03 NOTE — Telephone Encounter (Signed)
Spoke to pt. She said let her think about and talk to her daughter. She said she knows she needs it, but it seems to be getting more expensive everytime she gets it.

## 2016-11-14 ENCOUNTER — Emergency Department (HOSPITAL_COMMUNITY): Payer: Medicare Other

## 2016-11-14 ENCOUNTER — Encounter (HOSPITAL_COMMUNITY): Payer: Self-pay | Admitting: Emergency Medicine

## 2016-11-14 ENCOUNTER — Inpatient Hospital Stay (HOSPITAL_COMMUNITY)
Admission: EM | Admit: 2016-11-14 | Discharge: 2016-11-19 | DRG: 286 | Disposition: A | Discharge status: Skilled Nursing Facility | Payer: Medicare Other | Attending: Cardiology | Admitting: Cardiology

## 2016-11-14 ENCOUNTER — Encounter (HOSPITAL_COMMUNITY)
Admission: EM | Disposition: A | Discharge status: Skilled Nursing Facility | Payer: Self-pay | Source: Home / Self Care | Attending: Cardiology

## 2016-11-14 DIAGNOSIS — N182 Chronic kidney disease, stage 2 (mild): Secondary | ICD-10-CM | POA: Diagnosis not present

## 2016-11-14 DIAGNOSIS — E559 Vitamin D deficiency, unspecified: Secondary | ICD-10-CM | POA: Diagnosis present

## 2016-11-14 DIAGNOSIS — I252 Old myocardial infarction: Secondary | ICD-10-CM | POA: Diagnosis not present

## 2016-11-14 DIAGNOSIS — R778 Other specified abnormalities of plasma proteins: Secondary | ICD-10-CM

## 2016-11-14 DIAGNOSIS — R0902 Hypoxemia: Secondary | ICD-10-CM | POA: Diagnosis not present

## 2016-11-14 DIAGNOSIS — I509 Heart failure, unspecified: Secondary | ICD-10-CM | POA: Diagnosis not present

## 2016-11-14 DIAGNOSIS — J810 Acute pulmonary edema: Secondary | ICD-10-CM | POA: Diagnosis not present

## 2016-11-14 DIAGNOSIS — M818 Other osteoporosis without current pathological fracture: Secondary | ICD-10-CM | POA: Diagnosis not present

## 2016-11-14 DIAGNOSIS — Z7982 Long term (current) use of aspirin: Secondary | ICD-10-CM | POA: Diagnosis not present

## 2016-11-14 DIAGNOSIS — Z87891 Personal history of nicotine dependence: Secondary | ICD-10-CM

## 2016-11-14 DIAGNOSIS — Z5189 Encounter for other specified aftercare: Secondary | ICD-10-CM | POA: Diagnosis not present

## 2016-11-14 DIAGNOSIS — F329 Major depressive disorder, single episode, unspecified: Secondary | ICD-10-CM | POA: Diagnosis present

## 2016-11-14 DIAGNOSIS — K219 Gastro-esophageal reflux disease without esophagitis: Secondary | ICD-10-CM | POA: Diagnosis present

## 2016-11-14 DIAGNOSIS — R2681 Unsteadiness on feet: Secondary | ICD-10-CM | POA: Diagnosis not present

## 2016-11-14 DIAGNOSIS — J80 Acute respiratory distress syndrome: Secondary | ICD-10-CM | POA: Diagnosis not present

## 2016-11-14 DIAGNOSIS — N183 Chronic kidney disease, stage 3 (moderate): Secondary | ICD-10-CM | POA: Diagnosis not present

## 2016-11-14 DIAGNOSIS — R278 Other lack of coordination: Secondary | ICD-10-CM | POA: Diagnosis not present

## 2016-11-14 DIAGNOSIS — I10 Essential (primary) hypertension: Secondary | ICD-10-CM | POA: Diagnosis not present

## 2016-11-14 DIAGNOSIS — I255 Ischemic cardiomyopathy: Secondary | ICD-10-CM | POA: Diagnosis present

## 2016-11-14 DIAGNOSIS — G629 Polyneuropathy, unspecified: Secondary | ICD-10-CM | POA: Diagnosis present

## 2016-11-14 DIAGNOSIS — E785 Hyperlipidemia, unspecified: Secondary | ICD-10-CM | POA: Diagnosis not present

## 2016-11-14 DIAGNOSIS — I5021 Acute systolic (congestive) heart failure: Secondary | ICD-10-CM | POA: Diagnosis not present

## 2016-11-14 DIAGNOSIS — Z853 Personal history of malignant neoplasm of breast: Secondary | ICD-10-CM | POA: Diagnosis not present

## 2016-11-14 DIAGNOSIS — D649 Anemia, unspecified: Secondary | ICD-10-CM | POA: Diagnosis not present

## 2016-11-14 DIAGNOSIS — E784 Other hyperlipidemia: Secondary | ICD-10-CM | POA: Diagnosis not present

## 2016-11-14 DIAGNOSIS — Z7951 Long term (current) use of inhaled steroids: Secondary | ICD-10-CM | POA: Diagnosis not present

## 2016-11-14 DIAGNOSIS — J9601 Acute respiratory failure with hypoxia: Secondary | ICD-10-CM | POA: Diagnosis not present

## 2016-11-14 DIAGNOSIS — I130 Hypertensive heart and chronic kidney disease with heart failure and stage 1 through stage 4 chronic kidney disease, or unspecified chronic kidney disease: Secondary | ICD-10-CM | POA: Diagnosis not present

## 2016-11-14 DIAGNOSIS — I2510 Atherosclerotic heart disease of native coronary artery without angina pectoris: Secondary | ICD-10-CM | POA: Diagnosis present

## 2016-11-14 DIAGNOSIS — I5023 Acute on chronic systolic (congestive) heart failure: Secondary | ICD-10-CM | POA: Diagnosis present

## 2016-11-14 DIAGNOSIS — M6281 Muscle weakness (generalized): Secondary | ICD-10-CM | POA: Diagnosis not present

## 2016-11-14 DIAGNOSIS — J449 Chronic obstructive pulmonary disease, unspecified: Secondary | ICD-10-CM | POA: Diagnosis present

## 2016-11-14 DIAGNOSIS — R7989 Other specified abnormal findings of blood chemistry: Secondary | ICD-10-CM

## 2016-11-14 DIAGNOSIS — R072 Precordial pain: Secondary | ICD-10-CM | POA: Diagnosis not present

## 2016-11-14 DIAGNOSIS — Z9861 Coronary angioplasty status: Secondary | ICD-10-CM | POA: Diagnosis not present

## 2016-11-14 DIAGNOSIS — I249 Acute ischemic heart disease, unspecified: Secondary | ICD-10-CM | POA: Diagnosis not present

## 2016-11-14 DIAGNOSIS — Z955 Presence of coronary angioplasty implant and graft: Secondary | ICD-10-CM | POA: Diagnosis not present

## 2016-11-14 DIAGNOSIS — M810 Age-related osteoporosis without current pathological fracture: Secondary | ICD-10-CM | POA: Diagnosis present

## 2016-11-14 DIAGNOSIS — G4700 Insomnia, unspecified: Secondary | ICD-10-CM | POA: Diagnosis not present

## 2016-11-14 DIAGNOSIS — R0602 Shortness of breath: Secondary | ICD-10-CM | POA: Diagnosis not present

## 2016-11-14 DIAGNOSIS — Z79899 Other long term (current) drug therapy: Secondary | ICD-10-CM | POA: Diagnosis not present

## 2016-11-14 DIAGNOSIS — R069 Unspecified abnormalities of breathing: Secondary | ICD-10-CM | POA: Diagnosis not present

## 2016-11-14 DIAGNOSIS — I1310 Hypertensive heart and chronic kidney disease without heart failure, with stage 1 through stage 4 chronic kidney disease, or unspecified chronic kidney disease: Secondary | ICD-10-CM | POA: Diagnosis not present

## 2016-11-14 HISTORY — PX: LEFT HEART CATH AND CORONARY ANGIOGRAPHY: CATH118249

## 2016-11-14 HISTORY — DX: Dependence on supplemental oxygen: Z99.81

## 2016-11-14 LAB — CBC WITH DIFFERENTIAL/PLATELET
BASOS PCT: 0 %
Basophils Absolute: 0 10*3/uL (ref 0.0–0.1)
Basophils Absolute: 0 10*3/uL (ref 0.0–0.1)
Basophils Relative: 0 %
EOS ABS: 0.2 10*3/uL (ref 0.0–0.7)
EOS ABS: 0 10*3/uL (ref 0.0–0.7)
EOS PCT: 0 %
Eosinophils Relative: 2 %
HCT: 38.6 % (ref 36.0–46.0)
HCT: 39.6 % (ref 36.0–46.0)
HEMOGLOBIN: 12.8 g/dL (ref 12.0–15.0)
Hemoglobin: 13.1 g/dL (ref 12.0–15.0)
LYMPHS ABS: 2 10*3/uL (ref 0.7–4.0)
LYMPHS ABS: 0.7 10*3/uL (ref 0.7–4.0)
LYMPHS PCT: 4 %
Lymphocytes Relative: 16 %
MCH: 32.3 pg (ref 26.0–34.0)
MCH: 32.1 pg (ref 26.0–34.0)
MCHC: 33.2 g/dL (ref 30.0–36.0)
MCHC: 33.1 g/dL (ref 30.0–36.0)
MCV: 97.5 fL (ref 78.0–100.0)
MCV: 97.1 fL (ref 78.0–100.0)
MONOS PCT: 3 %
Monocytes Absolute: 0.9 10*3/uL (ref 0.1–1.0)
Monocytes Absolute: 0.4 10*3/uL (ref 0.1–1.0)
Monocytes Relative: 7 %
NEUTROS PCT: 75 %
Neutro Abs: 9.4 10*3/uL — ABNORMAL HIGH (ref 1.7–7.7)
Neutro Abs: 14.6 10*3/uL — ABNORMAL HIGH (ref 1.7–7.7)
Neutrophils Relative %: 93 %
PLATELETS: 180 10*3/uL (ref 150–400)
Platelets: 214 10*3/uL (ref 150–400)
RBC: 3.96 MIL/uL (ref 3.87–5.11)
RBC: 4.08 MIL/uL (ref 3.87–5.11)
RDW: 12.6 % (ref 11.5–15.5)
RDW: 12.8 % (ref 11.5–15.5)
WBC: 12.4 10*3/uL — ABNORMAL HIGH (ref 4.0–10.5)
WBC: 15.7 10*3/uL — AB (ref 4.0–10.5)

## 2016-11-14 LAB — POCT I-STAT, CHEM 8
BUN: 20 mg/dL (ref 6–20)
CHLORIDE: 97 mmol/L — AB (ref 101–111)
Calcium, Ion: 1.09 mmol/L — ABNORMAL LOW (ref 1.15–1.40)
Creatinine, Ser: 0.9 mg/dL (ref 0.44–1.00)
Glucose, Bld: 219 mg/dL — ABNORMAL HIGH (ref 65–99)
HCT: 39 % (ref 36.0–46.0)
HEMOGLOBIN: 13.3 g/dL (ref 12.0–15.0)
POTASSIUM: 4.5 mmol/L (ref 3.5–5.1)
SODIUM: 129 mmol/L — AB (ref 135–145)
TCO2: 26 mmol/L (ref 0–100)

## 2016-11-14 LAB — COMPREHENSIVE METABOLIC PANEL
ALBUMIN: 3.8 g/dL (ref 3.5–5.0)
ALK PHOS: 51 U/L (ref 38–126)
ALT: 11 U/L — AB (ref 14–54)
AST: 30 U/L (ref 15–41)
Anion gap: 15 (ref 5–15)
BUN: 14 mg/dL (ref 6–20)
CALCIUM: 8.6 mg/dL — AB (ref 8.9–10.3)
CHLORIDE: 96 mmol/L — AB (ref 101–111)
CO2: 19 mmol/L — AB (ref 22–32)
CREATININE: 1.23 mg/dL — AB (ref 0.44–1.00)
GFR calc Af Amer: 45 mL/min — ABNORMAL LOW (ref >60)
GFR calc non Af Amer: 39 mL/min — ABNORMAL LOW (ref >60)
Glucose, Bld: 260 mg/dL — ABNORMAL HIGH (ref 65–99)
Potassium: 4.3 mmol/L (ref 3.5–5.1)
SODIUM: 130 mmol/L — AB (ref 135–145)
Total Bilirubin: 0.9 mg/dL (ref 0.3–1.2)
Total Protein: 6.7 g/dL (ref 6.5–8.1)

## 2016-11-14 LAB — BASIC METABOLIC PANEL
Anion gap: 12 (ref 5–15)
BUN: 17 mg/dL (ref 6–20)
CALCIUM: 9 mg/dL (ref 8.9–10.3)
CO2: 25 mmol/L (ref 22–32)
CREATININE: 1.18 mg/dL — AB (ref 0.44–1.00)
Chloride: 95 mmol/L — ABNORMAL LOW (ref 101–111)
GFR calc Af Amer: 47 mL/min — ABNORMAL LOW (ref >60)
GFR, EST NON AFRICAN AMERICAN: 41 mL/min — AB (ref >60)
GLUCOSE: 162 mg/dL — AB (ref 65–99)
Potassium: 4.4 mmol/L (ref 3.5–5.1)
SODIUM: 132 mmol/L — AB (ref 135–145)

## 2016-11-14 LAB — POCT I-STAT 3, ART BLOOD GAS (G3+)
Acid-base deficit: 1 mmol/L (ref 0.0–2.0)
Bicarbonate: 24.1 mmol/L (ref 20.0–28.0)
O2 Saturation: 100 %
PCO2 ART: 38.9 mmHg (ref 32.0–48.0)
PH ART: 7.401 (ref 7.350–7.450)
TCO2: 25 mmol/L (ref 0–100)
pO2, Arterial: 461 mmHg — ABNORMAL HIGH (ref 83.0–108.0)

## 2016-11-14 LAB — LIPID PANEL
CHOL/HDL RATIO: 2.3 ratio
Cholesterol: 167 mg/dL (ref 0–200)
HDL: 73 mg/dL (ref >40)
LDL Cholesterol: 63 mg/dL (ref 0–99)
Triglycerides: 156 mg/dL — ABNORMAL HIGH (ref <150)
VLDL: 31 mg/dL (ref 0–40)

## 2016-11-14 LAB — BRAIN NATRIURETIC PEPTIDE: B Natriuretic Peptide: 386.5 pg/mL — ABNORMAL HIGH (ref 0.0–100.0)

## 2016-11-14 LAB — PROTIME-INR
INR: 1.06
Prothrombin Time: 13.9 seconds (ref 11.4–15.2)

## 2016-11-14 LAB — TSH: TSH: 1.249 u[IU]/mL (ref 0.350–4.500)

## 2016-11-14 LAB — MAGNESIUM: MAGNESIUM: 2.6 mg/dL — AB (ref 1.7–2.4)

## 2016-11-14 LAB — LIPASE, BLOOD: Lipase: 33 U/L (ref 11–51)

## 2016-11-14 LAB — POCT ACTIVATED CLOTTING TIME: Activated Clotting Time: 180 seconds

## 2016-11-14 LAB — TROPONIN I
Troponin I: 0.17 ng/mL (ref <0.03)
Troponin I: 0.4 ng/mL (ref <0.03)
Troponin I: 0.61 ng/mL (ref <0.03)

## 2016-11-14 LAB — APTT: aPTT: 26 seconds (ref 24–36)

## 2016-11-14 LAB — D-DIMER, QUANTITATIVE: D-Dimer, Quant: 2.71 ug/mL-FEU — ABNORMAL HIGH (ref 0.00–0.50)

## 2016-11-14 SURGERY — LEFT HEART CATH AND CORONARY ANGIOGRAPHY
Anesthesia: LOCAL

## 2016-11-14 MED ORDER — IOPAMIDOL (ISOVUE-370) INJECTION 76%
INTRAVENOUS | Status: AC
  Filled 2016-11-14: qty 125

## 2016-11-14 MED ORDER — METOPROLOL TARTRATE 12.5 MG HALF TABLET
12.5000 mg | ORAL_TABLET | Freq: Two times a day (BID) | ORAL | Status: DC
  Administered 2016-11-14 – 2016-11-19 (×10): 12.5 mg via ORAL
  Filled 2016-11-14 (×11): qty 1

## 2016-11-14 MED ORDER — BUPIVACAINE HCL (PF) 0.25 % IJ SOLN
INTRAMUSCULAR | Status: AC
  Filled 2016-11-14: qty 30

## 2016-11-14 MED ORDER — FAMOTIDINE 20 MG PO TABS
20.0000 mg | ORAL_TABLET | Freq: Two times a day (BID) | ORAL | Status: DC
  Administered 2016-11-14 – 2016-11-19 (×10): 20 mg via ORAL
  Filled 2016-11-14 (×10): qty 1

## 2016-11-14 MED ORDER — SODIUM CHLORIDE 0.9% FLUSH
3.0000 mL | Freq: Two times a day (BID) | INTRAVENOUS | Status: DC
  Administered 2016-11-14 – 2016-11-19 (×10): 3 mL via INTRAVENOUS

## 2016-11-14 MED ORDER — ASPIRIN 300 MG RE SUPP: 300.0000 mg | RECTAL | Status: AC

## 2016-11-14 MED ORDER — HEPARIN (PORCINE) IN NACL 100-0.45 UNIT/ML-% IJ SOLN: 12.0000 [IU]/kg/h | INTRAMUSCULAR | Status: DC

## 2016-11-14 MED ORDER — FUROSEMIDE 10 MG/ML IJ SOLN
40.0000 mg | Freq: Once | INTRAMUSCULAR | Status: AC
  Administered 2016-11-14: 40 mg via INTRAVENOUS
  Filled 2016-11-14: qty 4

## 2016-11-14 MED ORDER — SODIUM CHLORIDE 0.9 % IV SOLN
INTRAVENOUS | Status: DC | PRN
  Administered 2016-11-14: 10 mL/h via INTRAVENOUS

## 2016-11-14 MED ORDER — HEPARIN SODIUM (PORCINE) 5000 UNIT/ML IJ SOLN
4000.0000 [IU] | Freq: Once | INTRAMUSCULAR | Status: AC
  Administered 2016-11-14: 4000 [IU] via INTRAVENOUS
  Filled 2016-11-14: qty 1

## 2016-11-14 MED ORDER — HEPARIN (PORCINE) IN NACL 2-0.9 UNIT/ML-% IJ SOLN
INTRAMUSCULAR | Status: DC | PRN
  Administered 2016-11-14: 1000 mL

## 2016-11-14 MED ORDER — SODIUM CHLORIDE 0.9% FLUSH: 3.0000 mL | INTRAVENOUS | Status: DC | PRN

## 2016-11-14 MED ORDER — SODIUM CHLORIDE 0.9 % IV SOLN: INTRAVENOUS | Status: DC

## 2016-11-14 MED ORDER — BIVALIRUDIN TRIFLUOROACETATE 250 MG IV SOLR
INTRAVENOUS | Status: AC
  Filled 2016-11-14: qty 250

## 2016-11-14 MED ORDER — ASPIRIN 81 MG PO CHEW: 324.0000 mg | CHEWABLE_TABLET | ORAL | Status: AC

## 2016-11-14 MED ORDER — SODIUM CHLORIDE 0.9 % IV SOLN: 250.0000 mL | INTRAVENOUS | Status: DC | PRN

## 2016-11-14 MED ORDER — HEPARIN (PORCINE) IN NACL 2-0.9 UNIT/ML-% IJ SOLN
INTRAMUSCULAR | Status: AC
Start: 2016-11-14 — End: 2016-11-14
  Filled 2016-11-14: qty 1000

## 2016-11-14 MED ORDER — FUROSEMIDE 10 MG/ML IJ SOLN
40.0000 mg | Freq: Every day | INTRAMUSCULAR | Status: DC
  Administered 2016-11-14 – 2016-11-15 (×2): 40 mg via INTRAVENOUS
  Filled 2016-11-14 (×3): qty 4

## 2016-11-14 MED ORDER — LEVALBUTEROL HCL 1.25 MG/0.5ML IN NEBU
1.2500 mg | INHALATION_SOLUTION | Freq: Three times a day (TID) | RESPIRATORY_TRACT | Status: DC
  Administered 2016-11-14 – 2016-11-19 (×14): 1.25 mg via RESPIRATORY_TRACT
  Filled 2016-11-14 (×15): qty 0.5

## 2016-11-14 MED ORDER — ACETAMINOPHEN 325 MG PO TABS
650.0000 mg | ORAL_TABLET | ORAL | Status: DC | PRN
  Administered 2016-11-14 – 2016-11-15 (×3): 650 mg via ORAL
  Filled 2016-11-14 (×3): qty 2

## 2016-11-14 MED ORDER — LIDOCAINE HCL (PF) 1 % IJ SOLN
INTRAMUSCULAR | Status: AC
  Filled 2016-11-14: qty 30

## 2016-11-14 MED ORDER — ESCITALOPRAM OXALATE 10 MG PO TABS
10.0000 mg | ORAL_TABLET | Freq: Every day | ORAL | Status: DC
  Administered 2016-11-15 – 2016-11-19 (×5): 10 mg via ORAL
  Filled 2016-11-14 (×5): qty 1

## 2016-11-14 MED ORDER — NITROGLYCERIN 0.4 MG SL SUBL: 0.4000 mg | SUBLINGUAL_TABLET | SUBLINGUAL | Status: DC | PRN

## 2016-11-14 MED ORDER — ALBUTEROL SULFATE (2.5 MG/3ML) 0.083% IN NEBU: 2.5000 mg | INHALATION_SOLUTION | RESPIRATORY_TRACT | Status: DC | PRN

## 2016-11-14 MED ORDER — ATORVASTATIN CALCIUM 40 MG PO TABS
40.0000 mg | ORAL_TABLET | Freq: Every day | ORAL | Status: DC
  Administered 2016-11-15 – 2016-11-18 (×4): 40 mg via ORAL
  Filled 2016-11-14 (×5): qty 1

## 2016-11-14 MED ORDER — BUPIVACAINE HCL (PF) 0.25 % IJ SOLN
INTRAMUSCULAR | Status: DC | PRN
  Administered 2016-11-14: 15 mL

## 2016-11-14 MED ORDER — ASPIRIN EC 81 MG PO TBEC
81.0000 mg | DELAYED_RELEASE_TABLET | Freq: Every day | ORAL | Status: DC
  Administered 2016-11-15 – 2016-11-19 (×5): 81 mg via ORAL
  Filled 2016-11-14 (×5): qty 1

## 2016-11-14 MED ORDER — ONDANSETRON HCL 4 MG/2ML IJ SOLN: 4.0000 mg | Freq: Four times a day (QID) | INTRAMUSCULAR | Status: DC | PRN

## 2016-11-14 MED ORDER — HEPARIN (PORCINE) IN NACL 100-0.45 UNIT/ML-% IJ SOLN
900.0000 [IU]/h | INTRAMUSCULAR | Status: DC
  Filled 2016-11-14: qty 250

## 2016-11-14 MED ORDER — IOPAMIDOL (ISOVUE-370) INJECTION 76%
INTRAVENOUS | Status: DC | PRN
  Administered 2016-11-14: 45 mL via INTRA_ARTERIAL

## 2016-11-14 MED ORDER — HEPARIN (PORCINE) IN NACL 100-0.45 UNIT/ML-% IJ SOLN
800.0000 [IU]/h | INTRAMUSCULAR | Status: DC
  Administered 2016-11-15: 1150 [IU]/h via INTRAVENOUS
  Administered 2016-11-15: 1300 [IU]/h via INTRAVENOUS
  Administered 2016-11-15: 950 [IU]/h via INTRAVENOUS
  Filled 2016-11-14 (×2): qty 250

## 2016-11-14 SURGICAL SUPPLY — 8 items
CATH 5FR JL3.5 JR4 ANG PIG MP (CATHETERS) ×1 IMPLANT
CATH INFINITI 5FR JL5 (CATHETERS) ×1 IMPLANT
GUIDEWIRE 3MM J TIP .035 145 (WIRE) ×1 IMPLANT
KIT HEART LEFT (KITS) ×2 IMPLANT
PACK CARDIAC CATHETERIZATION (CUSTOM PROCEDURE TRAY) ×2 IMPLANT
SHEATH PINNACLE 6F 10CM (SHEATH) ×1 IMPLANT
SYR MEDRAD MARK V 150ML (SYRINGE) ×2 IMPLANT
TRANSDUCER W/STOPCOCK (MISCELLANEOUS) ×2 IMPLANT

## 2016-11-14 NOTE — Progress Notes (Signed)
ANTICOAGULATION CONSULT NOTE - Initial Consult  Pharmacy Consult for heparin Indication: chest pain/ACS  Allergies  Allergen Reactions  . Amoxicillin-Pot Clavulanate Anaphylaxis  . Penicillins Anaphylaxis  . Cephalexin Other (See Comments)    Reaction unknown  . Clarithromycin Other (See Comments)    Reaction unknown  . Lidocaine     Patient had tremors following use of lidocaine pain patches  . Nifedipine Other (See Comments)    Reaction unknown  . Nsaids Other (See Comments)    Heart issue  . Olmesartan Medoxomil     REACTION: cough;  But tolerating Losartan 02/2014  . Tramadol Hcl Other (See Comments)    loopy    Patient Measurements: Height: 5\' 6"  (167.6 cm) Weight: 180 lb (81.6 kg) IBW/kg (Calculated) : 59.3 Heparin Dosing Weight: 76  Vital Signs: Temp: 96.5 F (35.8 C) (05/05 1312) Temp Source: Rectal (05/05 1312) BP: 137/97 (05/05 1330) Pulse Rate: 116 (05/05 1330)  Labs:  Recent Labs  11/14/16 1310  HGB 12.8  HCT 38.6  PLT 214    CrCl cannot be calculated (Patient's most recent lab result is older than the maximum 21 days allowed.).   Medical History: Past Medical History:  Diagnosis Date  . ACE-inhibitor cough   . Allergic rhinitis, cause unspecified   . Angina   . Anxiety   . CHF (congestive heart failure) ([place])   . Chronic airway obstruction, not elsewhere classified   . Compression fracture of T12 vertebra ([place]) 10/28/2011  . Depressive disorder, not elsewhere classified   . Esophageal reflux   . Heart murmur    aS CHILD  . Malignant neoplasm of breast (female), unspecified site   . Myocardial infarct ([place])   . 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 Occlusion and stenosis of carotid artery without mention of cerebral infarction   . Osteoarthrosis, unspecified whether generalized or localized, unspecified site   . Osteoporosis, unspecified   . Other and unspecified hyperlipidemia   . Other dyspnea and respiratory  abnormality   . Peripheral vascular disease, unspecified ([place])   . Personal history of malignant neoplasm of breast   . Pneumonia   . Recurrent upper respiratory infection (URI)   . Shortness of breath   . Spinal stenosis   . Unspecified cardiovascular disease   . Unspecified essential hypertension   . Unspecified hearing loss   . Unspecified urinary incontinence     Assessment: Heparin infusion for ACS/CP. Pt given 4000 unit heparin bolus in ED. No known a/c PTA. CBC pending.   Goal of Therapy:  Heparin level 0.3-0.7 units/ml Monitor platelets by anticoagulation protocol: Yes   Plan:  1. Start heparin infusion at 900 units/hr 2. Check anti-Xa level in 8 hours and daily while on heparin 3. Continue to monitor H&H and platelets  Vincenza Hews, PharmD, BCPS 11/14/2016, 2:09 PM

## 2016-11-14 NOTE — ED Notes (Signed)
Activated Code Stemi  

## 2016-11-14 NOTE — H&P (Signed)
Haley Harris is an 83 y.o. female.   Chief Complaint: Left-sided chest pain associated with shortness of breath HPI: Patient is 83 year old female with past medical history significant for coronary artery disease history of non-Q-wave myocardial infarction 2 in the past status post PCI to distal RCA in 2012 and then PTCA stenting to proximal LAD in August 2015., Ischemic cardiomyopathy EF approximately 40-45%, hypertension, COPD, history of cerebrovascular disease, degenerative joint disease, history of spinal stenosis, peripheral vascular disease, hyperlipidemia, GERD, depression, history of carcinoma of the breast in the past, vitamin D deficiency, came to by EMS because of progressive worsening shortness of breath for last 2 days and then developed chest pain in the ED localized grade 5/10 EKG done in the ED showed sinus tachycardia with nonspecific intraventricular conduction delay left axis deviation and diffuse ST elevation in inferior and anterior lateral leads with poor R-wave progression and was noted to have minimally elevated troponin I. Code STEMI was called by EDP. Patient when seen in the ED was on BiPAP complaining of left-sided chest pain. Denies any relation of chest pain to breathing. Denies any cough fever chills. Patient was initially noted to be hypoxic requiring breathing treatment and was placed on BiPAP and received 40 mg of IV Lasix and breathing treatment and Solu-Medrol.  Past Medical History:  Diagnosis Date  . ACE-inhibitor cough   . Allergic rhinitis, cause unspecified   . Angina   . Anxiety   . CHF (congestive heart failure) ([place])   . Chronic airway obstruction, not elsewhere classified   . Compression fracture of T12 vertebra ([place]) 10/28/2011  . Depressive disorder, not elsewhere classified   . Esophageal reflux   . Heart murmur    aS CHILD  . Malignant neoplasm of breast (female), unspecified site   . Myocardial infarct ([place])   . 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 Occlusion and stenosis of carotid artery without mention of cerebral infarction   . Osteoarthrosis, unspecified whether generalized or localized, unspecified site   . Osteoporosis, unspecified   . Other and unspecified hyperlipidemia   . Other dyspnea and respiratory abnormality   . Peripheral vascular disease, unspecified ([place])   . Personal history of malignant neoplasm of breast   . Pneumonia   . Recurrent upper respiratory infection (URI)   . Shortness of breath   . Spinal stenosis   . Unspecified cardiovascular disease   . Unspecified essential hypertension   . Unspecified hearing loss   . Unspecified urinary incontinence     Past Surgical History:  Procedure Laterality Date  . ABDOMINAL HYSTERECTOMY    . APPENDECTOMY  1948  . BREAST IMPLANT REMOVAL  06/12/09   right  . BREAST RECONSTRUCTION  1998   Reconstruction   . CARDIAC CATHETERIZATION N/A 01/25/2015   Procedure: Left Heart Cath and Coronary Angiography;  Surgeon: Charolette Forward, MD;  Location: [facility];  Service: Cardiovascular;  Laterality: N/A;  . CORONARY ANGIOPLASTY  11/12   distal RCA  . FLEXIBLE SIGMOIDOSCOPY N/A 01/21/2015   Procedure: FLEXIBLE SIGMOIDOSCOPY;  Surgeon: Richmond Campbell, MD;  Location: [facility] ENDOSCOPY;  Service: Endoscopy;  Laterality: N/A;  . FLEXIBLE SIGMOIDOSCOPY  01/22/2015      . FRACTURE SURGERY     fracture right elbow  . LEFT HEART CATHETERIZATION WITH CORONARY ANGIOGRAM N/A 06/05/2011   Procedure: LEFT HEART CATHETERIZATION WITH CORONARY ANGIOGRAM;  Surgeon: Clent Demark, MD;  Location: [facility] CATH LAB;  Service: Cardiovascular;  Laterality:  N/A;  . LEFT HEART CATHETERIZATION WITH CORONARY ANGIOGRAM N/A 03/05/2014   Procedure: LEFT HEART CATHETERIZATION WITH CORONARY ANGIOGRAM;  Surgeon: Clent Demark, MD;  Location: [facility] CATH LAB;  Service: Cardiovascular;  Laterality: N/A;  . LUMBAR LAMINECTOMY  2003  . MASTECTOMY  1987   Right  . MASTECTOMY    .  MASTOIDECTOMY  childhood  . REDUCTION MAMMAPLASTY  1998   Left  . SHOULDER SURGERY  02/2005   Bilateral fractures with multiple surgeries    Family History  Problem Relation Age of Onset  . Heart attack Father 98  . Cancer Mother     ovarian  . Cancer Brother     lung  . Arthritis      family   Social History:  reports that she quit smoking about 42 years ago. She has never used smokeless tobacco. She reports that she drinks alcohol. She reports that she does not use drugs.  Allergies:  Allergies  Allergen Reactions  . Amoxicillin-Pot Clavulanate Anaphylaxis  . Penicillins Anaphylaxis  . Cephalexin Other (See Comments)    Reaction unknown  . Clarithromycin Other (See Comments)    Reaction unknown  . Lidocaine     Patient had tremors following use of lidocaine pain patches  . Nifedipine Other (See Comments)    Reaction unknown  . Nsaids Other (See Comments)    Heart issue  . Olmesartan Medoxomil     REACTION: cough;  But tolerating Losartan 02/2014  . Tramadol Hcl Other (See Comments)    loopy    Medications Prior to Admission  Medication Sig Dispense Refill  . acetaminophen (TYLENOL) 325 MG tablet Take 2 tablets (650 mg total) by mouth every 4 (four) hours as needed for headache or mild pain.    Marland Kitchen albuterol (PROVENTIL) (2.5 MG/3ML) 0.083% nebulizer solution Take 3 mLs (2.5 mg total) by nebulization every 6 (six) hours as needed for wheezing or shortness of breath. 540 mL 3  . aspirin 81 MG tablet Take 81 mg by mouth daily after lunch.     . Calcium Carbonate-Vitamin D (CALTRATE 600+D PO) Take 1 tablet by mouth daily after lunch.     . carvedilol (COREG) 12.5 MG tablet Take 1 tablet by mouth two  times daily with a meal (Patient taking differently: Take 6.25 mg by mouth 2 (two) times daily with a meal. Take 1 tablet by mouth two  times daily with a meal) 180 tablet 0  . denosumab (PROLIA) 60 MG/ML SOLN injection Inject 60 mg into the skin every 6 (six) months. Reported on  11/05/2015    . escitalopram (LEXAPRO) 10 MG tablet Take 1 tablet (10 mg total) by mouth daily. 15 tablet 0  . famotidine (PEPCID) 20 MG tablet Take 1 tablet (20 mg total) by mouth 2 (two) times daily. 180 tablet 3  . fexofenadine (ALLEGRA) 180 MG tablet Take 180 mg by mouth at bedtime.    . fluticasone (FLONASE) 50 MCG/ACT nasal spray Use 2 sprays in each  nostril daily 48 g 3  . furosemide (LASIX) 40 MG tablet Take 1-2 tablets (40-80 mg total) by mouth 2 (two) times daily. (Patient taking differently: Take 40 mg by mouth 2 (two) times daily. Take two tabs BID with weight gain) 120 tablet 3  . gabapentin (NEURONTIN) 100 MG capsule TAKE 1 CAPSULE BY MOUTH TWO TIMES DAILY (Patient taking differently: TAKE 1 CAPSULE BY MOUTH one TIMES DAILY) 180 capsule 3  . levocetirizine (XYZAL) 5 MG tablet Take 5  mg by mouth every evening.    Marland Kitchen losartan (COZAAR) 50 MG tablet Take 1 tablet by mouth  daily 90 tablet 1  . montelukast (SINGULAIR) 10 MG tablet TAKE 1 TABLET BY MOUTH DAILY 90 tablet 3  . Multiple Vitamin (MULITIVITAMIN WITH MINERALS) TABS Take 1 tablet by mouth daily after lunch.     . oxyCODONE (OXY IR/ROXICODONE) 5 MG immediate release tablet Take 1 tablet (5 mg total) by mouth 2 (two) times daily as needed for severe pain. 60 tablet 0  . pravastatin (PRAVACHOL) 80 MG tablet Take 1 tablet by mouth  daily 90 tablet 3  . PROAIR HFA 108 (90 Base) MCG/ACT inhaler USE 2 PUFFS EVERY 6 HOURS AS NEEDED FOR WHEEZING OR SHORTNESS OF BREATH 8.5 g 5  . spironolactone (ALDACTONE) 25 MG tablet TAKE 1 TABLET BY MOUTH  DAILY 90 tablet 3  . vitamin B-12 (CYANOCOBALAMIN) 500 MCG tablet Take 1,000 mcg by mouth 3 (three) times a week. Mon, Wed, Fri    . buprenorphine (BUTRANS) 10 MCG/HR PTWK patch Place 1 patch (10 mcg total) onto the skin once a week. (Patient not taking: Reported on 11/14/2016) 4 patch 2    Results for orders placed or performed during the hospital encounter of 11/14/16 (from the past 48 hour(s))  CBC  with Differential     Status: Abnormal   Collection Time: 11/14/16  1:10 PM  Result Value Ref Range   WBC 12.4 (H) 4.0 - 10.5 K/uL   RBC 3.96 3.87 - 5.11 MIL/uL   Hemoglobin 12.8 12.0 - 15.0 g/dL   HCT 38.6 36.0 - 46.0 %   MCV 97.5 78.0 - 100.0 fL   MCH 32.3 26.0 - 34.0 pg   MCHC 33.2 30.0 - 36.0 g/dL   RDW 12.6 11.5 - 15.5 %   Platelets 214 150 - 400 K/uL   Neutrophils Relative % 75 %   Neutro Abs 9.4 (H) 1.7 - 7.7 K/uL   Lymphocytes Relative 16 %   Lymphs Abs 2.0 0.7 - 4.0 K/uL   Monocytes Relative 7 %   Monocytes Absolute 0.9 0.1 - 1.0 K/uL   Eosinophils Relative 2 %   Eosinophils Absolute 0.2 0.0 - 0.7 K/uL   Basophils Relative 0 %   Basophils Absolute 0.0 0.0 - 0.1 K/uL  Comprehensive metabolic panel     Status: Abnormal   Collection Time: 11/14/16  1:10 PM  Result Value Ref Range   Sodium 130 (L) 135 - 145 mmol/L   Potassium 4.3 3.5 - 5.1 mmol/L   Chloride 96 (L) 101 - 111 mmol/L   CO2 19 (L) 22 - 32 mmol/L   Glucose, Bld 260 (H) 65 - 99 mg/dL   BUN 14 6 - 20 mg/dL   Creatinine, Ser 1.23 (H) 0.44 - 1.00 mg/dL   Calcium 8.6 (L) 8.9 - 10.3 mg/dL   Total Protein 6.7 6.5 - 8.1 g/dL   Albumin 3.8 3.5 - 5.0 g/dL   AST 30 15 - 41 U/L   ALT 11 (L) 14 - 54 U/L   Alkaline Phosphatase 51 38 - 126 U/L   Total Bilirubin 0.9 0.3 - 1.2 mg/dL   GFR calc non Af Amer 39 (L) >60 mL/min   GFR calc Af Amer 45 (L) >60 mL/min    Comment: (NOTE) The eGFR has been calculated using the CKD EPI equation. This calculation has not been validated in all clinical situations. eGFR's persistently <60 mL/min signify possible Chronic Kidney Disease.  Anion gap 15 5 - 15  Lipase, blood     Status: None   Collection Time: 11/14/16  1:10 PM  Result Value Ref Range   Lipase 33 11 - 51 U/L  Protime-INR     Status: None   Collection Time: 11/14/16  1:10 PM  Result Value Ref Range   Prothrombin Time 13.9 11.4 - 15.2 seconds   INR 1.06   D-dimer, quantitative (not at [facility])     Status: Abnormal    Collection Time: 11/14/16  1:10 PM  Result Value Ref Range   D-Dimer, Quant 2.71 (H) 0.00 - 0.50 ug/mL-FEU    Comment: (NOTE) At the manufacturer cut-off of 0.50 ug/mL FEU, this assay has been documented to exclude PE with a sensitivity and negative predictive value of 97 to 99%.  At this time, this assay has not been approved by the FDA to exclude DVT/VTE. Results should be correlated with clinical presentation.   Brain natriuretic peptide     Status: Abnormal   Collection Time: 11/14/16  1:10 PM  Result Value Ref Range   B Natriuretic Peptide 386.5 (H) 0.0 - 100.0 pg/mL  APTT     Status: None   Collection Time: 11/14/16  1:10 PM  Result Value Ref Range   aPTT 26 24 - 36 seconds  Troponin I     Status: Abnormal   Collection Time: 11/14/16  1:10 PM  Result Value Ref Range   Troponin I 0.17 (HH) <0.03 ng/mL    Comment: CRITICAL RESULT CALLED TO, READ BACK BY AND VERIFIED WITH: C.PURDUE,RN 11/14/16 @1413  BY V.WILKINS   Lipid panel     Status: Abnormal   Collection Time: 11/14/16  1:10 PM  Result Value Ref Range   Cholesterol 167 0 - 200 mg/dL   Triglycerides 156 (H) <150 mg/dL   HDL 73 >40 mg/dL   Total CHOL/HDL Ratio 2.3 RATIO   VLDL 31 0 - 40 mg/dL   LDL Cholesterol 63 0 - 99 mg/dL    Comment:        Total Cholesterol/HDL:CHD Risk Coronary Heart Disease Risk Table                     Men   Women  1/2 Average Risk   3.4   3.3  Average Risk       5.0   4.4  2 X Average Risk   9.6   7.1  3 X Average Risk  23.4   11.0        Use the calculated Patient Ratio above and the CHD Risk Table to determine the patient's CHD Risk.        ATP III CLASSIFICATION (LDL):  <100     mg/dL   Optimal  100-129  mg/dL   Near or Above                    Optimal  130-159  mg/dL   Borderline  160-189  mg/dL   High  >190     mg/dL   Very High   I-STAT, chem 8     Status: Abnormal   Collection Time: 11/14/16  2:22 PM  Result Value Ref Range   Sodium 129 (L) 135 - 145 mmol/L    Potassium 4.5 3.5 - 5.1 mmol/L   Chloride 97 (L) 101 - 111 mmol/L   BUN 20 6 - 20 mg/dL   Creatinine, Ser 0.90 0.44 - 1.00 mg/dL   Glucose,  Bld 219 (H) 65 - 99 mg/dL   Calcium, Ion 1.09 (L) 1.15 - 1.40 mmol/L   TCO2 26 0 - 100 mmol/L   Hemoglobin 13.3 12.0 - 15.0 g/dL   HCT 39.0 36.0 - 46.0 %  I-STAT 3, arterial blood gas (G3+)     Status: Abnormal   Collection Time: 11/14/16  2:22 PM  Result Value Ref Range   pH, Arterial 7.401 7.350 - 7.450   pCO2 arterial 38.9 32.0 - 48.0 mmHg   pO2, Arterial 461.0 (H) 83.0 - 108.0 mmHg   Bicarbonate 24.1 20.0 - 28.0 mmol/L   TCO2 25 0 - 100 mmol/L   O2 Saturation 100.0 %   Acid-base deficit 1.0 0.0 - 2.0 mmol/L   Patient temperature HIDE    Sample type ARTERIAL   POCT Activated clotting time     Status: None   Collection Time: 11/14/16  2:37 PM  Result Value Ref Range   Activated Clotting Time 180 seconds   Dg Chest Portable 1 View  Result Date: 11/14/2016 CLINICAL DATA:  Shortness of breath and hypoxia. EXAM: PORTABLE CHEST 1 VIEW COMPARISON:  10/29/2015 FINDINGS: Cardiac enlargement noted. Aortic atherosclerosis. Moderate diffuse pulmonary edema is identified bilaterally. IMPRESSION: 1. Cardiac enlargement and pulmonary edema suspicious for CHF. 2.  Aortic Atherosclerosis (ICD10-I70.0). Electronically Signed   By: Kerby Moors M.D.   On: 11/14/2016 13:33    Review of Systems  Unable to perform ROS: Critical illness    Blood pressure (!) 137/97, pulse (!) 116, temperature (!) 96.5 F (35.8 C), temperature source Rectal, resp. rate 18, height 5' 6"  (1.676 m), weight 81.6 kg (180 lb), SpO2 100 %. Physical Exam  Constitutional: She is oriented to person, place, and time.  Eyes: Conjunctivae are normal. Left eye exhibits no discharge. No scleral icterus.  Neck: Normal range of motion. Neck supple. JVD present.  Cardiovascular:  Tachycardic S1 and S2 soft  Respiratory:  Decreased breath sound anterolaterally with faint rales  GI: Soft.  Bowel sounds are normal. She exhibits no distension. There is no tenderness. There is no rebound.  Musculoskeletal:  No clubbing cyanosis trace edema noted  Neurological: She is alert and oriented to person, place, and time.     Assessment/Plan Acute coronary syndrome/acute pulmonary edema rule out MI Coronary artery disease history of MI 2 in the past status post PCI to LAD and RCA in the past Hypertension Hyperlipidemia Ischemic cardiomyopathy COPD History of spinal stenosis History of compression fracture T12 . History of cerebrovascular disease History of CVA of breast Vitamin D deficiency Osteoporosis Chronic kidney disease stage III in the past Plan Discussed with patient and her daughter regarding new EKG findings and as she was having chest pain regarding emergency left cath possible PTCA stenting its risk and benefits i.e. death MI stroke need for emergency CABG local vascular complications worsening renal function etc. and consents for PCI  Charolette Forward, MD 11/14/2016, 2:51 PM

## 2016-11-14 NOTE — ED Triage Notes (Signed)
Patient from home for shortness of breath x2 days.  EMS states patient was cyanotic with O2 saturation 60% on arrival to scene, placed on CPAP.  Patient alert and oriented at this time.  18g saline lock in left AC.  Received 125mg  solumedrol, 2g magnesium, 0.5 atrovent, 10mg  albuterol from EMS PTA.

## 2016-11-14 NOTE — Progress Notes (Signed)
Plan to trial pt off of BIPAP. Pt taken off BIPAP for neb tx, will be placed on [place] as tolerated. RT will continue to monitor.

## 2016-11-14 NOTE — Progress Notes (Signed)
Pt transported to 4N14 on BIPAP without complications.

## 2016-11-14 NOTE — ED Provider Notes (Signed)
[facility] DEPT Provider Note   CSN: [id number] Arrival date & time: 11/14/16  1301     History   Chief Complaint Chief Complaint  Patient presents with  . Shortness of Breath    HPI Haley Harris is a 83 y.o. female.  The history is provided by the patient, the EMS personnel, a relative and medical records.  Shortness of Breath  This is a new problem. The average episode lasts 2 days. The problem occurs continuously.The current episode started yesterday. The problem has been rapidly worsening. Associated symptoms include cough and wheezing. Pertinent negatives include no fever, no headaches, no neck pain, no sputum production, no hemoptysis, no chest pain (not on arrival but developed here), no vomiting, no abdominal pain and no leg swelling. She has tried nothing for the symptoms. The treatment provided no relief. She has had prior hospitalizations. Associated medical issues include COPD, CAD, heart failure and past MI.    Past Medical History:  Diagnosis Date  . ACE-inhibitor cough   . Allergic rhinitis, cause unspecified   . Angina   . Anxiety   . CHF (congestive heart failure) ([place])   . Chronic airway obstruction, not elsewhere classified   . Compression fracture of T12 vertebra ([place]) 10/28/2011  . Depressive disorder, not elsewhere classified   . Esophageal reflux   . Heart murmur    aS CHILD  . Malignant neoplasm of breast (female), unspecified site   . Myocardial infarct ([place])   . Neuromuscular disorder ([place])    NEROPATHY FROM STENOSIS  . Occlusion and stenosis of carotid artery without mention of cerebral infarction   . Osteoarthrosis, unspecified whether generalized or localized, unspecified site   . Osteoporosis, unspecified   . Other and unspecified hyperlipidemia   . Other dyspnea and respiratory abnormality   . Peripheral vascular disease, unspecified ([place])   . Personal history of malignant neoplasm of breast   . Pneumonia   . Recurrent upper  respiratory infection (URI)   . Shortness of breath   . Spinal stenosis   . Unspecified cardiovascular disease   . Unspecified essential hypertension   . Unspecified hearing loss   . Unspecified urinary incontinence     Patient Active Problem List   Diagnosis Date Noted  . Osteoarthritis of left hip 09/28/2016  . Metatarsal stress fracture with routine healing 08/04/2016  . Cervical spondylosis without myelopathy 07/31/2016  . Primary osteoarthritis of both first carpometacarpal joints 07/31/2016  . Acute on chronic combined systolic and diastolic heart failure ([place]) 07/22/2016  . Spinal stenosis in cervical region 05/29/2016  . Venous stasis dermatitis 11/13/2015  . Essential hypertension 10/30/2015  . Diverticulitis of colon 02/18/2015  . Chronic combined systolic and diastolic CHF (congestive heart failure) ([place])   . Chronic kidney disease, stage III (moderate)   . PVD (peripheral vascular disease) ([place])   . Right adrenal mass ([place]) 01/18/2015  . Chronic venous insufficiency 12/18/2014  . Advanced directives, counseling/discussion 04/02/2014  . Neuropathy due to peripheral vascular disease ([place]) 03/11/2014  . Chronic combined systolic and diastolic CHF, NYHA class 1 ([place]) 03/06/2014  . Routine general medical examination at a health care facility 03/02/2013  . Constipation due to pain medication 10/28/2011  . CAD (coronary artery disease), native coronary artery 06/05/2011  . COPD exacerbation ([place]) 10/02/2010  . Dyslipidemia 10/30/2006  . Episodic mood disorder ([place]) 10/30/2006  . COPD (chronic obstructive pulmonary disease) ([place]) 10/30/2006  . GERD 10/30/2006  . Osteoarthritis 10/30/2006  . Osteoporosis 10/30/2006  .  URINARY INCONTINENCE 10/30/2006    Past Surgical History:  Procedure Laterality Date  . ABDOMINAL HYSTERECTOMY    . APPENDECTOMY  1948  . BREAST IMPLANT REMOVAL  06/12/09   right  . BREAST RECONSTRUCTION  1998   Reconstruction   . CARDIAC  CATHETERIZATION N/A 01/25/2015   Procedure: Left Heart Cath and Coronary Angiography;  Surgeon: Charolette Forward, MD;  Location: [facility] LAB;  Service: Cardiovascular;  Laterality: N/A;  . CORONARY ANGIOPLASTY  11/12   distal RCA  . FLEXIBLE SIGMOIDOSCOPY N/A 01/21/2015   Procedure: FLEXIBLE SIGMOIDOSCOPY;  Surgeon: Richmond Campbell, MD;  Location: [facility] ENDOSCOPY;  Service: Endoscopy;  Laterality: N/A;  . FLEXIBLE SIGMOIDOSCOPY  01/22/2015      . FRACTURE SURGERY     fracture right elbow  . LEFT HEART CATHETERIZATION WITH CORONARY ANGIOGRAM N/A 06/05/2011   Procedure: LEFT HEART CATHETERIZATION WITH CORONARY ANGIOGRAM;  Surgeon: Clent Demark, MD;  Location: [facility] CATH LAB;  Service: Cardiovascular;  Laterality: N/A;  . LEFT HEART CATHETERIZATION WITH CORONARY ANGIOGRAM N/A 03/05/2014   Procedure: LEFT HEART CATHETERIZATION WITH CORONARY ANGIOGRAM;  Surgeon: Clent Demark, MD;  Location: [facility] CATH LAB;  Service: Cardiovascular;  Laterality: N/A;  . LUMBAR LAMINECTOMY  2003  . MASTECTOMY  1987   Right  . MASTECTOMY    . MASTOIDECTOMY  childhood  . REDUCTION MAMMAPLASTY  1998   Left  . SHOULDER SURGERY  02/2005   Bilateral fractures with multiple surgeries    OB History    No data available       Home Medications    Prior to Admission medications   Medication Sig Start Date End Date Taking? Authorizing Provider  acetaminophen (TYLENOL) 325 MG tablet Take 2 tablets (650 mg total) by mouth every 4 (four) hours as needed for headache or mild pain. 01/27/15   Bonnielee Haff, MD  albuterol (PROVENTIL) (2.5 MG/3ML) 0.083% nebulizer solution Take 3 mLs (2.5 mg total) by nebulization every 6 (six) hours as needed for wheezing or shortness of breath. 02/18/15   Venia Carbon, MD  aspirin 81 MG tablet Take 81 mg by mouth daily after lunch.     [provider]  buprenorphine (BUTRANS) 10 MCG/HR PTWK patch Place 1 patch (10 mcg total) onto the skin once a week. 10/26/16   Bayard Hugger, NP  Calcium Carbonate-Vitamin D (CALTRATE 600+D PO) Take 1 tablet by mouth daily after lunch.     [provider]  carvedilol (COREG) 12.5 MG tablet Take 1 tablet by mouth two  times daily with a meal Patient taking differently: Take 6.25 mg by mouth 2 (two) times daily with a meal. Take 1 tablet by mouth two  times daily with a meal 08/31/16   Venia Carbon, MD  denosumab (PROLIA) 60 MG/ML SOLN injection Inject 60 mg into the skin every 6 (six) months. Reported on 11/05/2015    [provider]  escitalopram (LEXAPRO) 10 MG tablet Take 1 tablet (10 mg total) by mouth daily. 10/27/16   Venia Carbon, MD  famotidine (PEPCID) 20 MG tablet Take 1 tablet (20 mg total) by mouth 2 (two) times daily. 03/03/16   Venia Carbon, MD  fexofenadine (ALLEGRA) 180 MG tablet Take 180 mg by mouth at bedtime.    [provider]  fluticasone (FLONASE) 50 MCG/ACT nasal spray Use 2 sprays in each  nostril daily 03/11/16   Venia Carbon, MD  furosemide (LASIX) 40 MG tablet Take 1-2 tablets (40-80  mg total) by mouth 2 (two) times daily. 07/22/16   Venia Carbon, MD  gabapentin (NEURONTIN) 100 MG capsule TAKE 1 CAPSULE BY MOUTH TWO TIMES DAILY Patient taking differently: TAKE 1 CAPSULE BY MOUTH one TIMES DAILY 08/31/16   Venia Carbon, MD  levocetirizine (XYZAL) 5 MG tablet Take 5 mg by mouth every evening.    [provider]  losartan (COZAAR) 50 MG tablet Take 1 tablet by mouth  daily 03/11/16   Venia Carbon, MD  montelukast (SINGULAIR) 10 MG tablet TAKE 1 TABLET BY MOUTH DAILY 03/03/16   Venia Carbon, MD  Multiple Vitamin (MULITIVITAMIN WITH MINERALS) TABS Take 1 tablet by mouth daily after lunch.     [provider]  oxyCODONE (OXY IR/ROXICODONE) 5 MG immediate release tablet Take 1 tablet (5 mg total) by mouth 2 (two) times daily as needed for severe pain. 10/26/16   Bayard Hugger, NP  pravastatin (PRAVACHOL) 80 MG tablet Take 1 tablet by mouth   daily 03/03/16   Venia Carbon, MD  PROAIR HFA 108 [phone] Base) MCG/ACT inhaler USE 2 PUFFS EVERY 6 HOURS AS NEEDED FOR WHEEZING OR SHORTNESS OF BREATH 02/13/16   [name] I, MD  spironolactone (ALDACTONE) 25 MG tablet TAKE 1 TABLET BY MOUTH  DAILY 08/10/16   Venia Carbon, MD  vitamin B-12 (CYANOCOBALAMIN) 500 MCG tablet Take 1,000 mcg by mouth 3 (three) times a week. Mon, Wed, Fri    [provider]    Family History Family History  Problem Relation Age of Onset  . Heart attack Father 26  . Cancer Mother     ovarian  . Cancer Brother     lung  . Arthritis      family    Social History Social History  Substance Use Topics  . Smoking status: Former Smoker    Quit date: 07/13/1974  . Smokeless tobacco: Never Used  . Alcohol use 0.0 oz/week     Comment: occasional     Allergies   Amoxicillin-pot clavulanate; Penicillins; Cephalexin; Clarithromycin; Lidocaine; Nifedipine; Nsaids; Olmesartan medoxomil; and Tramadol hcl   Review of Systems Review of Systems  Constitutional: Positive for fatigue. Negative for chills, diaphoresis and fever.  HENT: Negative for congestion.   Respiratory: Positive for cough, chest tightness, shortness of breath and wheezing. Negative for hemoptysis, sputum production and stridor.   Cardiovascular: Negative for chest pain (not on arrival but developed here), palpitations and leg swelling.  Gastrointestinal: Negative for abdominal pain, constipation, diarrhea, nausea and vomiting.  Genitourinary: Negative for dysuria.  Musculoskeletal: Negative for back pain, neck pain and neck stiffness.  Skin: Negative for wound.  Neurological: Negative for headaches.  Psychiatric/Behavioral: Negative for agitation.  All other systems reviewed and are negative.    Physical Exam Updated Vital Signs BP 110/77   Pulse 94   Temp (!) 96.5 F (35.8 C) (Rectal)   Resp 19   Ht 5\' 6"  (1.676 m)   Wt 180 lb (81.6 kg)   SpO2 100%   BMI 29.05 kg/m    Physical Exam  Constitutional: She appears well-developed and well-nourished. She appears distressed.  HENT:  Head: Normocephalic and atraumatic.  Mouth/Throat: Oropharynx is clear and moist.  Eyes: Conjunctivae are normal. Pupils are equal, round, and reactive to light.  Neck: Normal range of motion. Neck supple.  Cardiovascular: Regular rhythm, normal heart sounds and intact distal pulses.  Tachycardia present.   No murmur heard. Pulmonary/Chest: No stridor. Tachypnea noted. She is  in respiratory distress. She has rales. She exhibits no tenderness.  Abdominal: Soft. Bowel sounds are normal. There is no tenderness.  Musculoskeletal: She exhibits no edema or tenderness.  Neurological: She is alert. No sensory deficit. She exhibits normal muscle tone.  Skin: Skin is warm. Capillary refill takes less than 2 seconds. No rash noted. She is not diaphoretic.  Psychiatric: She has a normal mood and affect.  Nursing note and vitals reviewed.    ED Treatments / Results  Labs (all labs ordered are listed, but only abnormal results are displayed) Labs Reviewed  CBC WITH DIFFERENTIAL/PLATELET - Abnormal; Notable for the following:       Result Value   WBC 12.4 (*)    Neutro Abs 9.4 (*)    All other components within normal limits  COMPREHENSIVE METABOLIC PANEL - Abnormal; Notable for the following:    Sodium 130 (*)    Chloride 96 (*)    CO2 19 (*)    Glucose, Bld 260 (*)    Creatinine, Ser 1.23 (*)    Calcium 8.6 (*)    ALT 11 (*)    GFR calc non Af Amer 39 (*)    GFR calc Af Amer 45 (*)    All other components within normal limits  D-DIMER, QUANTITATIVE (NOT AT [facility]) - Abnormal; Notable for the following:    D-Dimer, Quant 2.71 (*)    All other components within normal limits  BRAIN NATRIURETIC PEPTIDE - Abnormal; Notable for the following:    B Natriuretic Peptide 386.5 (*)    All other components within normal limits  TROPONIN I - Abnormal; Notable for the following:     Troponin I 0.17 (*)    All other components within normal limits  LIPID PANEL - Abnormal; Notable for the following:    Triglycerides 156 (*)    All other components within normal limits  BASIC METABOLIC PANEL - Abnormal; Notable for the following:    Sodium 132 (*)    Chloride 95 (*)    Glucose, Bld 162 (*)    Creatinine, Ser 1.18 (*)    GFR calc non Af Amer 41 (*)    GFR calc Af Amer 47 (*)    All other components within normal limits  MAGNESIUM - Abnormal; Notable for the following:    Magnesium 2.6 (*)    All other components within normal limits  TROPONIN I - Abnormal; Notable for the following:    Troponin I 0.40 (*)    All other components within normal limits  CBC WITH DIFFERENTIAL/PLATELET - Abnormal; Notable for the following:    WBC 15.7 (*)    Neutro Abs 14.6 (*)    All other components within normal limits  POCT I-STAT, CHEM 8 - Abnormal; Notable for the following:    Sodium 129 (*)    Chloride 97 (*)    Glucose, Bld 219 (*)    Calcium, Ion 1.09 (*)    All other components within normal limits  POCT I-STAT 3, ART BLOOD GAS (G3+) - Abnormal; Notable for the following:    pO2, Arterial 461.0 (*)    All other components within normal limits  LIPASE, BLOOD  PROTIME-INR  APTT  TSH  TROPONIN I  TROPONIN I  HEPARIN LEVEL (UNFRACTIONATED)  I-STAT CG4 LACTIC ACID, ED  Randolm Idol, ED  POCT ACTIVATED CLOTTING TIME  I-STAT CG4 LACTIC ACID, ED  Randolm Idol, ED    EKG  EKG Interpretation  Date/Time:  Saturday Nov 14 2016 13:03:54 EDT Ventricular Rate:  131 PR Interval:    QRS Duration: 142 QT Interval:  362 QTC Calculation: 535 R Axis:   -94 Text Interpretation:  Sinus tachycardia Nonspecific IVCD with LAD Inferolateral infarct, acute (LCx) Anterior infarct, acute STEMI Confirmed by Keira Bohlin MD, [place] [phone]) on 11/14/2016 1:49:55 PM       Radiology Dg Chest Portable 1 View  Result Date: 11/14/2016 CLINICAL DATA:  Shortness of breath and  hypoxia. EXAM: PORTABLE CHEST 1 VIEW COMPARISON:  10/29/2015 FINDINGS: Cardiac enlargement noted. Aortic atherosclerosis. Moderate diffuse pulmonary edema is identified bilaterally. IMPRESSION: 1. Cardiac enlargement and pulmonary edema suspicious for CHF. 2.  Aortic Atherosclerosis (ICD10-I70.0). Electronically Signed   By: Kerby Moors M.D.   On: 11/14/2016 13:33    Procedures Procedures (including critical care time)  CRITICAL CARE Performed by: Gwenyth Allegra Beadie Matsunaga Total critical care time: 35 minutes Critical care time was exclusive of separately billable procedures and treating other patients. Critical care was necessary to treat or prevent imminent or life-threatening deterioration. Critical care was time spent personally by me on the following activities: development of treatment plan with patient and/or surrogate as well as nursing, discussions with consultants, evaluation of patient's response to treatment, examination of patient, obtaining history from patient or surrogate, ordering and performing treatments and interventions, ordering and review of laboratory studies, ordering and review of radiographic studies, pulse oximetry and re-evaluation of patient's condition.   Medications Ordered in ED Medications  0.9 %  sodium chloride infusion (not administered)  escitalopram (LEXAPRO) tablet 10 mg (10 mg Oral Not Given 11/14/16 1545)  famotidine (PEPCID) tablet 20 mg (20 mg Oral Not Given 11/14/16 1545)  aspirin chewable tablet 324 mg (324 mg Oral Not Given 11/14/16 1545)    Or  aspirin suppository 300 mg ( Rectal See Alternative 11/14/16 1545)  aspirin EC tablet 81 mg (not administered)  nitroGLYCERIN (NITROSTAT) SL tablet 0.4 mg (not administered)  ondansetron (ZOFRAN) injection 4 mg (not administered)  acetaminophen (TYLENOL) tablet 650 mg (not administered)  metoprolol tartrate (LOPRESSOR) tablet 12.5 mg (12.5 mg Oral Not Given 11/14/16 1545)  atorvastatin (LIPITOR) tablet 40 mg (40  mg Oral Not Given 11/14/16 1800)  sodium chloride flush (NS) 0.9 % injection 3 mL (3 mLs Intravenous Given 11/14/16 1724)  sodium chloride flush (NS) 0.9 % injection 3 mL (not administered)  0.9 %  sodium chloride infusion (250 mLs Intravenous Rate/Dose Verify 11/14/16 1900)  furosemide (LASIX) injection 40 mg (40 mg Intravenous Given 11/14/16 1722)  levalbuterol (XOPENEX) nebulizer solution 1.25 mg (1.25 mg Nebulization Given 11/14/16 1944)  heparin ADULT infusion 100 units/mL (25000 units/225mL sodium chloride 0.45%) (not administered)  albuterol (PROVENTIL) (2.5 MG/3ML) 0.083% nebulizer solution 2.5 mg (not administered)  furosemide (LASIX) injection 40 mg (40 mg Intravenous Given 11/14/16 1338)  heparin injection 4,000 Units (4,000 Units Intravenous Given 11/14/16 1338)     Initial Impression / Assessment and Plan / ED Course  I have reviewed the triage vital signs and the nursing notes.  Pertinent labs & imaging results that were available during my care of the patient were reviewed by me and considered in my medical decision making (see chart for details).     Skylor LEIDI ASTLE is a 83 y.o. female with a complicated past medical history status post PCI x2, COPD, hypertension, CHF, Hyperlipidemia, and osteoarthritis who presents by EMS for respiratory distress, shortness of breath, hypoxia, and cyanosis. EMS reports that for the last 40 hours, patient has had worsening shortness  of breath. They were called out when patient cannot catch her breath and had increased work of breathing. EMS reported wheezing initially and patient was given albuterol, Atrovent, magnesium, and Solu-Medrol. Wheezing improved. Patient found to have oxygen saturation in the 60% range on arrival. Patient placed on BiPAP and transported to ED.  On arrival, patient is on BiPAP and is maintaining her oxygen saturation in the 90s. Patient has very crackly lungs with minimal wheezing. Suspect fluid overload on auscultation. Patient has  minimal lower extremity swelling but has a very edematous face which family reports his new. Patient denies chest pain. Patient does however report that her last heart attack involved no chest pain. Patient received stents for this.  Shortly after arrival, EKG performed showing acute MI. When compared to prior, these changes appear new. Code STEMI was called.  Patient continued workup including portable chest x-ray showed concern for fluid overload. As patient is maintaining her oxygen saturation blood pressure, do not feel patient needs intubation at this time.  Patient reported chest pain begun in ED.  Cardiology saw the patient and recommended heparin initiation. Patient was quickly taken to heart catheterization lab for further management. Patient went to Cath Lab for all labs return however, troponin returned positive.  Family was at the bedside for all decision-making. Patient admitted to catheterization lab cardiology for further management.   Final Clinical Impressions(s) / ED Diagnoses   Final diagnoses:  Hypoxia  Precordial chest pain  Elevated troponin  Acute respiratory failure with hypoxia (HCC)    Clinical Impression: 1. Acute respiratory failure with hypoxia ([place])   2. Hypoxia   3. Precordial chest pain   4. Elevated troponin     Disposition: Admit to Cath lab with cardiology    Ethylene Reznick, Gwenyth Allegra, MD 11/14/16 2040

## 2016-11-14 NOTE — Progress Notes (Signed)
ANTICOAGULATION CONSULT NOTE - Initial Consult  Pharmacy Consult for heparin Indication: R/o pulmonary embolism   Allergies  Allergen Reactions  . Amoxicillin-Pot Clavulanate Anaphylaxis  . Penicillins Anaphylaxis  . Cephalexin Other (See Comments)    Reaction unknown  . Clarithromycin Other (See Comments)    Reaction unknown  . Lidocaine     Patient had tremors following use of lidocaine pain patches  . Nifedipine Other (See Comments)    Reaction unknown  . Nsaids Other (See Comments)    Heart issue  . Olmesartan Medoxomil     REACTION: cough;  But tolerating Losartan 02/2014  . Tramadol Hcl Other (See Comments)    loopy    Patient Measurements: Height: 5\' 6"  (167.6 cm) Weight: 180 lb (81.6 kg) IBW/kg (Calculated) : 59.3 Heparin Dosing Weight: 76  Vital Signs: Temp: 96.5 F (35.8 C) (05/05 1312) Temp Source: Rectal (05/05 1312) BP: 106/78 (05/05 1600) Pulse Rate: 90 (05/05 1600)  Labs:  Recent Labs  11/14/16 1310 11/14/16 1422  HGB 12.8 13.3  HCT 38.6 39.0  PLT 214  --   APTT 26  --   LABPROT 13.9  --   INR 1.06  --   CREATININE 1.23* 0.90  TROPONINI 0.17*  --     Estimated Creatinine Clearance: 49.2 mL/min (by C-G formula based on SCr of 0.9 mg/dL).   Medical History: Past Medical History:  Diagnosis Date  . ACE-inhibitor cough   . Allergic rhinitis, cause unspecified   . Angina   . Anxiety   . CHF (congestive heart failure) ([place])   . Chronic airway obstruction, not elsewhere classified   . Compression fracture of T12 vertebra ([place]) 10/28/2011  . Depressive disorder, not elsewhere classified   . Esophageal reflux   . Heart murmur    aS CHILD  . Malignant neoplasm of breast (female), unspecified site   . Myocardial infarct ([place])   . Neuromuscular disorder ([place])    NEROPATHY FROM STENOSIS  . Occlusion and stenosis of carotid artery without mention of cerebral infarction   . Osteoarthrosis, unspecified whether generalized or localized,  unspecified site   . Osteoporosis, unspecified   . Other and unspecified hyperlipidemia   . Other dyspnea and respiratory abnormality   . Peripheral vascular disease, unspecified ([place])   . Personal history of malignant neoplasm of breast   . Pneumonia   . Recurrent upper respiratory infection (URI)   . Shortness of breath   . Spinal stenosis   . Unspecified cardiovascular disease   . Unspecified essential hypertension   . Unspecified hearing loss   . Unspecified urinary incontinence     Assessment: 74 YOM with CP s/p LHC to resume IV heparin 8 hours post sheath pull for r/o PE. D-dimer elevated at 2.71. CT PE planned tomorrow.  Goal of Therapy:  Heparin level 0.3-0.7 units/ml Monitor platelets by anticoagulation protocol: Yes   Plan:  1. Start heparin infusion at 1300 units/hr 2. Check anti-Xa level in 8 hours and daily while on heparin 3. Continue to monitor H&H and platelets  4. Start heparin infusion at 2100. No bolus   Albertina Parr, PharmD., BCPS Clinical Pharmacist Pager [phone]

## 2016-11-15 ENCOUNTER — Inpatient Hospital Stay (HOSPITAL_COMMUNITY): Payer: Medicare Other

## 2016-11-15 ENCOUNTER — Other Ambulatory Visit (HOSPITAL_COMMUNITY): Payer: Medicare Other

## 2016-11-15 ENCOUNTER — Encounter (HOSPITAL_COMMUNITY): Payer: Self-pay

## 2016-11-15 LAB — TROPONIN I: TROPONIN I: 0.65 ng/mL — AB (ref <0.03)

## 2016-11-15 LAB — HEPARIN LEVEL (UNFRACTIONATED)
Heparin Unfractionated: 0.84 IU/mL — ABNORMAL HIGH (ref 0.30–0.70)
Heparin Unfractionated: 0.95 IU/mL — ABNORMAL HIGH (ref 0.30–0.70)

## 2016-11-15 LAB — MRSA PCR SCREENING: MRSA BY PCR: NEGATIVE

## 2016-11-15 NOTE — Progress Notes (Signed)
ANTICOAGULATION CONSULT NOTE - Follow Up Consult  Pharmacy Consult for heparin Indication: R/o pulmonary embolism   Allergies  Allergen Reactions  . Amoxicillin-Pot Clavulanate Anaphylaxis  . Penicillins Anaphylaxis  . Cephalexin Other (See Comments)    Reaction unknown  . Clarithromycin Other (See Comments)    Reaction unknown  . Lidocaine     Patient had tremors following use of lidocaine pain patches  . Nifedipine Other (See Comments)    Reaction unknown  . Nsaids Other (See Comments)    Heart issue  . Olmesartan Medoxomil     REACTION: cough;  But tolerating Losartan 02/2014  . Tramadol Hcl Other (See Comments)    loopy    Patient Measurements: Height: 5\' 6"  (167.6 cm) Weight: 175 lb 3.2 oz (79.5 kg) IBW/kg (Calculated) : 59.3 Heparin Dosing Weight: 76  Vital Signs: Temp: 98.3 F (36.8 C) (05/06 2003) Temp Source: Oral (05/06 2003) BP: 111/49 (05/06 2003) Pulse Rate: 77 (05/06 1705)  Labs:  Recent Labs  11/14/16 1310 11/14/16 1422 11/14/16 1555 11/14/16 2121 11/15/16 0355 11/15/16 0638 11/15/16 2009  HGB 12.8 13.3 13.1  --   --   --   --   HCT 38.6 39.0 39.6  --   --   --   --   PLT 214  --  180  --   --   --   --   APTT 26  --   --   --   --   --   --   LABPROT 13.9  --   --   --   --   --   --   INR 1.06  --   --   --   --   --   --   HEPARINUNFRC  --   --   --   --   --  0.84* 0.95*  CREATININE 1.23* 0.90 1.18*  --   --   --   --   TROPONINI 0.17*  --  0.40* 0.61* 0.65*  --   --     Estimated Creatinine Clearance: 37.1 mL/min (A) (by C-G formula based on SCr of 1.18 mg/dL (H)).   Medical History: Past Medical History:  Diagnosis Date  . ACE-inhibitor cough   . Allergic rhinitis, cause unspecified   . Angina   . Anxiety   . CHF (congestive heart failure) ([place])   . Chronic airway obstruction, not elsewhere classified   . Compression fracture of T12 vertebra ([place]) 10/28/2011  . Depressive disorder, not elsewhere classified   . Esophageal  reflux   . Heart murmur    aS CHILD  . Malignant neoplasm of breast (female), unspecified site   . Myocardial infarct ([place])   . Neuromuscular disorder ([place])    NEROPATHY FROM STENOSIS  . Occlusion and stenosis of carotid artery without mention of cerebral infarction   . Osteoarthrosis, unspecified whether generalized or localized, unspecified site   . Osteoporosis, unspecified   . Other and unspecified hyperlipidemia   . Other dyspnea and respiratory abnormality   . Peripheral vascular disease, unspecified ([place])   . Personal history of malignant neoplasm of breast   . Pneumonia   . Recurrent upper respiratory infection (URI)   . Shortness of breath   . Spinal stenosis   . Unspecified cardiovascular disease   . Unspecified essential hypertension   . Unspecified hearing loss   . Unspecified urinary incontinence     Assessment: 58 YOM with CP s/p LHC started on  IV heparin for r/o PE. D-dimer elevated at 2.71. CT PE planned today. HL supra-therapeutic at 0.95 despite recent heparin rate decrease. RN confirms no bleeding noted.   Goal of Therapy:  Heparin level 0.3-0.7 units/ml Monitor platelets by anticoagulation protocol: Yes   Plan:  1. Decrease heparin infusion to 950 units/hr  2. Check anti-Xa level in 8 hours and daily while on heparin 3. Continue to monitor H&H and platelets   Vincenza Hews, PharmD, BCPS 11/15/2016, 8:47 PM  '

## 2016-11-15 NOTE — Progress Notes (Signed)
Ref: Haley Carbon, MD   Subjective:  Awake. Slow diuresis with lasix use. Non obstructive CAD on recent cardiac catheterization. Echocardiogram shows moderate LV systolic dysfunction with overall EF of 25-30 % as only base of the heart is moving well. She has mild AI and PI and moderate MR and TR.   Objective:  Vital Signs in the last 24 hours: Temp:  [98.1 F (36.7 C)-98.6 F (37 C)] 98.3 F (36.8 C) (05/06 1705) Pulse Rate:  [68-94] 77 (05/06 1705) Cardiac Rhythm: Heart block (05/06 1200) Resp:  [13-21] 13 (05/06 1705) BP: (78-120)/(37-87) 118/63 (05/06 1705) SpO2:  [99 %-100 %] 100 % (05/06 1705) FiO2 (%):  [40 %] 40 % (05/05 1800) Weight:  [79.5 kg (175 lb 3.2 oz)] 79.5 kg (175 lb 3.2 oz) (05/06 0422)  Physical Exam: BP Readings from Last 1 Encounters:  11/15/16 118/63    Wt Readings from Last 1 Encounters:  11/15/16 79.5 kg (175 lb 3.2 oz)    Weight change:  Body mass index is 28.28 kg/m. HEENT: [place]/AT, Eyes-Blue, PERL, EOMI, Conjunctiva-Pink, Sclera-Non-icteric Neck: No JVD, No bruit, Trachea midline. Lungs:  Basal crackles, Bilateral. Cardiac:  Regular rhythm, normal S1 and S2, no S3. II/VI systolic murmur. Abdomen:  Soft, non-tender. BS present. Extremities:  Trace edema present. No cyanosis. No clubbing. CNS: AxOx3, Cranial nerves grossly intact, moves all 4 extremities.  Skin: Warm and dry.   Intake/Output from previous day: 05/05 0701 - 05/06 0700 In: -  Out: 300 [Urine:300]    Lab Results: BMET    Component Value Date/Time   NA 132 (L) 11/14/2016 1555   NA 129 (L) 11/14/2016 1422   NA 130 (L) 11/14/2016 1310   K 4.4 11/14/2016 1555   K 4.5 11/14/2016 1422   K 4.3 11/14/2016 1310   K 5.0 05/31/2013 1552   CL 95 (L) 11/14/2016 1555   CL 97 (L) 11/14/2016 1422   CL 96 (L) 11/14/2016 1310   CO2 25 11/14/2016 1555   CO2 19 (L) 11/14/2016 1310   CO2 30 09/07/2016 1143   GLUCOSE 162 (H) 11/14/2016 1555   GLUCOSE 219 (H) 11/14/2016 1422   GLUCOSE  260 (H) 11/14/2016 1310   BUN 17 11/14/2016 1555   BUN 20 11/14/2016 1422   BUN 14 11/14/2016 1310   CREATININE 1.18 (H) 11/14/2016 1555   CREATININE 0.90 11/14/2016 1422   CREATININE 1.23 (H) 11/14/2016 1310   CALCIUM 9.0 11/14/2016 1555   CALCIUM 8.6 (L) 11/14/2016 1310   CALCIUM 9.1 09/07/2016 1143   GFRNONAA 41 (L) 11/14/2016 1555   GFRNONAA 39 (L) 11/14/2016 1310   GFRNONAA 40 (L) 11/02/2015 0550   GFRAA 47 (L) 11/14/2016 1555   GFRAA 45 (L) 11/14/2016 1310   GFRAA 46 (L) 11/02/2015 0550   CBC    Component Value Date/Time   WBC 15.7 (H) 11/14/2016 1555   RBC 4.08 11/14/2016 1555   HGB 13.1 11/14/2016 1555   HCT 39.6 11/14/2016 1555   PLT 180 11/14/2016 1555   MCV 97.1 11/14/2016 1555   MCH 32.1 11/14/2016 1555   MCHC 33.1 11/14/2016 1555   RDW 12.8 11/14/2016 1555   LYMPHSABS 0.7 11/14/2016 1555   MONOABS 0.4 11/14/2016 1555   EOSABS 0.0 11/14/2016 1555   BASOSABS 0.0 11/14/2016 1555   HEPATIC Function Panel  Recent Labs  09/07/16 1143 11/14/16 1310  PROT 6.9 6.7   HEMOGLOBIN A1C No components found for: HGA1C,  MPG CARDIAC ENZYMES Lab Results  Component Value Date  CKTOTAL 63 06/05/2011   CKMB 5.9 (H) 06/05/2011   TROPONINI 0.65 (HH) 11/15/2016   TROPONINI 0.61 (HH) 11/14/2016   TROPONINI 0.40 (HH) 11/14/2016   BNP No results for input(s): PROBNP in the last 8760 hours. TSH  Recent Labs  11/14/16 1555  TSH 1.249   CHOLESTEROL  Recent Labs  03/03/16 1254 11/14/16 1310  CHOL 147 167    Scheduled Meds: . aspirin EC  81 mg Oral Daily  . atorvastatin  40 mg Oral q1800  . escitalopram  10 mg Oral Daily  . famotidine  20 mg Oral BID  . furosemide  40 mg Intravenous Daily  . levalbuterol  1.25 mg Nebulization TID  . metoprolol tartrate  12.5 mg Oral BID  . sodium chloride flush  3 mL Intravenous Q12H   Continuous Infusions: . sodium chloride    . sodium chloride 250 mL (11/14/16 1900)  . heparin 1,150 Units/hr (11/15/16 1600)   PRN  Meds:.sodium chloride, acetaminophen, albuterol, nitroGLYCERIN, ondansetron (ZOFRAN) IV, sodium chloride flush  Assessment/Plan: Acute left heart systolic failure CAD Hypertension Hyperlipidemia COPD S/P breast cancer CKD, II  Medical treatment.   LOS: 1 day    Dixie Dials  MD  11/15/2016, 5:09 PM

## 2016-11-15 NOTE — Progress Notes (Signed)
ANTICOAGULATION CONSULT NOTE - Follow Up Consult  Pharmacy Consult for heparin Indication: R/o pulmonary embolism   Allergies  Allergen Reactions  . Amoxicillin-Pot Clavulanate Anaphylaxis  . Penicillins Anaphylaxis  . Cephalexin Other (See Comments)    Reaction unknown  . Clarithromycin Other (See Comments)    Reaction unknown  . Lidocaine     Patient had tremors following use of lidocaine pain patches  . Nifedipine Other (See Comments)    Reaction unknown  . Nsaids Other (See Comments)    Heart issue  . Olmesartan Medoxomil     REACTION: cough;  But tolerating Losartan 02/2014  . Tramadol Hcl Other (See Comments)    loopy    Patient Measurements: Height: 5\' 6"  (167.6 cm) Weight: 175 lb 3.2 oz (79.5 kg) IBW/kg (Calculated) : 59.3 Heparin Dosing Weight: 76  Vital Signs: Temp: 98.1 F (36.7 C) (05/06 0800) Temp Source: Oral (05/06 0800) BP: 97/57 (05/06 0935) Pulse Rate: 87 (05/06 0935)  Labs:  Recent Labs  11/14/16 1310 11/14/16 1422 11/14/16 1555 11/14/16 2121 11/15/16 0355 11/15/16 0638  HGB 12.8 13.3 13.1  --   --   --   HCT 38.6 39.0 39.6  --   --   --   PLT 214  --  180  --   --   --   APTT 26  --   --   --   --   --   LABPROT 13.9  --   --   --   --   --   INR 1.06  --   --   --   --   --   HEPARINUNFRC  --   --   --   --   --  0.84*  CREATININE 1.23* 0.90 1.18*  --   --   --   TROPONINI 0.17*  --  0.40* 0.61* 0.65*  --     Estimated Creatinine Clearance: 37.1 mL/min (A) (by C-G formula based on SCr of 1.18 mg/dL (H)).   Medical History: Past Medical History:  Diagnosis Date  . ACE-inhibitor cough   . Allergic rhinitis, cause unspecified   . Angina   . Anxiety   . CHF (congestive heart failure) ([place])   . Chronic airway obstruction, not elsewhere classified   . Compression fracture of T12 vertebra ([place]) 10/28/2011  . Depressive disorder, not elsewhere classified   . Esophageal reflux   . Heart murmur    aS CHILD  . Malignant neoplasm of  breast (female), unspecified site   . Myocardial infarct ([place])   . Neuromuscular disorder ([place])    NEROPATHY FROM STENOSIS  . Occlusion and stenosis of carotid artery without mention of cerebral infarction   . Osteoarthrosis, unspecified whether generalized or localized, unspecified site   . Osteoporosis, unspecified   . Other and unspecified hyperlipidemia   . Other dyspnea and respiratory abnormality   . Peripheral vascular disease, unspecified ([place])   . Personal history of malignant neoplasm of breast   . Pneumonia   . Recurrent upper respiratory infection (URI)   . Shortness of breath   . Spinal stenosis   . Unspecified cardiovascular disease   . Unspecified essential hypertension   . Unspecified hearing loss   . Unspecified urinary incontinence     Assessment: 27 YOM with CP s/p LHC started on IV heparin for r/o PE. D-dimer elevated at 2.71. CT PE planned today. HL supra-therapeutic. RN confirms no bleeding noted.   Goal of Therapy:  Heparin  level 0.3-0.7 units/ml Monitor platelets by anticoagulation protocol: Yes   Plan:  1. Decrease heparin infusion to 1150 units/hr  2. Check anti-Xa level in 8 hours and daily while on heparin 3. Continue to monitor H&H and platelets   Albertina Parr, PharmD., BCPS Clinical Pharmacist Pager [phone]

## 2016-11-16 ENCOUNTER — Encounter (HOSPITAL_COMMUNITY): Payer: Self-pay | Admitting: Cardiology

## 2016-11-16 LAB — CBC
HEMATOCRIT: 30 % — AB (ref 36.0–46.0)
Hemoglobin: 10.1 g/dL — ABNORMAL LOW (ref 12.0–15.0)
MCH: 33.1 pg (ref 26.0–34.0)
MCHC: 33.7 g/dL (ref 30.0–36.0)
MCV: 98.4 fL (ref 78.0–100.0)
Platelets: 127 10*3/uL — ABNORMAL LOW (ref 150–400)
RBC: 3.05 MIL/uL — ABNORMAL LOW (ref 3.87–5.11)
RDW: 13.4 % (ref 11.5–15.5)
WBC: 11.7 10*3/uL — ABNORMAL HIGH (ref 4.0–10.5)

## 2016-11-16 LAB — ECHOCARDIOGRAM COMPLETE
HEIGHTINCHES: 66 in
WEIGHTICAEL: 2803.2 [oz_av]

## 2016-11-16 LAB — HEPARIN LEVEL (UNFRACTIONATED): HEPARIN UNFRACTIONATED: 0.85 [IU]/mL — AB (ref 0.30–0.70)

## 2016-11-16 MED ORDER — BISACODYL 5 MG PO TBEC
10.0000 mg | DELAYED_RELEASE_TABLET | Freq: Every day | ORAL | Status: DC | PRN
  Administered 2016-11-16 – 2016-11-17 (×2): 10 mg via ORAL
  Filled 2016-11-16 (×2): qty 2

## 2016-11-16 MED ORDER — LOSARTAN POTASSIUM 50 MG PO TABS
50.0000 mg | ORAL_TABLET | Freq: Every day | ORAL | Status: DC
  Administered 2016-11-16 – 2016-11-19 (×4): 50 mg via ORAL
  Filled 2016-11-16 (×4): qty 1

## 2016-11-16 MED ORDER — FUROSEMIDE 40 MG PO TABS
40.0000 mg | ORAL_TABLET | Freq: Every day | ORAL | Status: DC
  Administered 2016-11-16 – 2016-11-19 (×4): 40 mg via ORAL
  Filled 2016-11-16 (×4): qty 1

## 2016-11-16 MED FILL — Lidocaine HCl Local Preservative Free (PF) Inj 1%: INTRAMUSCULAR | Qty: 30 | Status: AC

## 2016-11-16 NOTE — Progress Notes (Signed)
Subjective:  Patient denies any chest pain or shortness of breath.  States feels better, but tired and weak.  Objective:  Vital Signs in the last 24 hours: Temp:  [97.8 F (36.6 C)-98.6 F (37 C)] 98.2 F (36.8 C) (05/07 0813) Pulse Rate:  [73-87] 77 (05/07 0813) Resp:  [13-21] 20 (05/07 0813) BP: (94-141)/(49-89) 141/89 (05/07 0813) SpO2:  [94 %-100 %] 94 % (05/07 0859)  Intake/Output from previous day: 05/06 0701 - 05/07 0700 In: 1176 [P.O.:660; I.V.:516] Out: 550 [Urine:550] Intake/Output from this shift: Total I/O In: 69.9 [I.V.:69.9] Out: 300 [Urine:300]  Physical Exam: Neck: no adenopathy, no carotid bruit, no JVD and supple, symmetrical, trachea midline Lungs: decreased breath sounds at bases Heart: regular rate and rhythm, S1, S2 normal and soft systolic murmur and S3 gallop noted Abdomen: regular rate and rhythm, S1, S2 normal and soft systolic murmur and S3 gallop noted Extremities: extremities normal, atraumatic, no cyanosis or edema  Lab Results:  Recent Labs  11/14/16 1555 11/16/16 0206  WBC 15.7* 11.7*  HGB 13.1 10.1*  PLT 180 127*    Recent Labs  11/14/16 1310 11/14/16 1422 11/14/16 1555  NA 130* 129* 132*  K 4.3 4.5 4.4  CL 96* 97* 95*  CO2 19*  --  25  GLUCOSE 260* 219* 162*  BUN 14 20 17   CREATININE 1.23* 0.90 1.18*    Recent Labs  11/14/16 2121 11/15/16 0355  TROPONINI 0.61* 0.65*   Hepatic Function Panel  Recent Labs  11/14/16 1310  PROT 6.7  ALBUMIN 3.8  AST 30  ALT 11*  ALKPHOS 51  BILITOT 0.9    Recent Labs  11/14/16 1310  CHOL 167   No results for input(s): PROTIME in the last 72 hours.  Imaging: Imaging results have been reviewed and Dg Chest Portable 1 View  Result Date: 11/14/2016 CLINICAL DATA:  Shortness of breath and hypoxia. EXAM: PORTABLE CHEST 1 VIEW COMPARISON:  10/29/2015 FINDINGS: Cardiac enlargement noted. Aortic atherosclerosis. Moderate diffuse pulmonary edema is identified bilaterally.  IMPRESSION: 1. Cardiac enlargement and pulmonary edema suspicious for CHF. 2.  Aortic Atherosclerosis (ICD10-I70.0). Electronically Signed   By: Kerby Moors M.D.   On: 11/14/2016 13:33    Cardiac Studies:  Assessment/Plan:  Resolving acute decompensated systolic heart failure Coronary artery disease history of MI 2 in the past status post PCI to LAD and RCA in the past Hypertension Hyperlipidemia Ischemic cardiomyopathy COPD History of spinal stenosis History of compression fracture T12 . History of cerebrovascular disease History of CVA of breast Vitamin D deficiency Osteoporosis Chronic kidney disease stage III in the past Acute on chronic anemia, rule out GI loss. Plan DC heparin. SCDs. Check stool for occult blood. Transferr to telemetry. Social services for discharge planning Check in the a.m.  LOS: 2 days    Charolette Forward 11/16/2016, 9:15 AM

## 2016-11-16 NOTE — Progress Notes (Signed)
ANTICOAGULATION CONSULT NOTE - Follow Up Consult  Pharmacy Consult for heparin Indication: R/o pulmonary embolism   Allergies  Allergen Reactions  . Amoxicillin-Pot Clavulanate Anaphylaxis  . Penicillins Anaphylaxis  . Cephalexin Other (See Comments)    Reaction unknown  . Clarithromycin Other (See Comments)    Reaction unknown  . Lidocaine     Patient had tremors following use of lidocaine pain patches  . Nifedipine Other (See Comments)    Reaction unknown  . Nsaids Other (See Comments)    Heart issue  . Olmesartan Medoxomil     REACTION: cough;  But tolerating Losartan 02/2014  . Tramadol Hcl Other (See Comments)    loopy    Patient Measurements: Height: 5\' 6"  (167.6 cm) Weight: 175 lb 3.2 oz (79.5 kg) IBW/kg (Calculated) : 59.3 Heparin Dosing Weight: 76  Vital Signs: Temp: 97.8 F (36.6 C) (05/07 0314) Temp Source: Oral (05/07 0314) BP: 121/70 (05/07 0314) Pulse Rate: 73 (05/07 0314)  Labs:  Recent Labs  11/14/16 1310 11/14/16 1422 11/14/16 1555 11/14/16 2121 11/15/16 0355 11/15/16 0638 11/15/16 2009 11/16/16 0206  HGB 12.8 13.3 13.1  --   --   --   --   --   HCT 38.6 39.0 39.6  --   --   --   --   --   PLT 214  --  180  --   --   --   --   --   APTT 26  --   --   --   --   --   --   --   LABPROT 13.9  --   --   --   --   --   --   --   INR 1.06  --   --   --   --   --   --   --   HEPARINUNFRC  --   --   --   --   --  0.84* 0.95* 0.85*  CREATININE 1.23* 0.90 1.18*  --   --   --   --   --   TROPONINI 0.17*  --  0.40* 0.61* 0.65*  --   --   --     Estimated Creatinine Clearance: 37.1 mL/min (A) (by C-G formula based on SCr of 1.18 mg/dL (H)).  Assessment: 37 YOM with CP s/p LHC started on IV heparin for r/o PE. D-dimer elevated at 2.71. Plan for chest CT. Heparin level remains supratherapeutic (0.84) on gtt at 950 units/hr. RN confirms no bleeding noted.   Goal of Therapy:  Heparin level 0.3-0.7 units/ml Monitor platelets by anticoagulation  protocol: Yes   Plan:  Decrease heparin infusion to 800 units/hr  Will f/u 8 hr heparin level  Sherlon Handing, PharmD, BCPS Clinical pharmacist, pager [phone] 11/16/2016, 3:41 AM  '

## 2016-11-16 NOTE — Progress Notes (Addendum)
PT Cancellation Note  Patient Details Name: TINY CHAUDHARY MRN: [id number] DOB: 22-Jan-1929   Cancelled Treatment:    Reason Eval/Treat Not Completed: Medical issues which prohibited therapy ( continued rise in troponin)   Maralee Higuchi B Jalyah Weinheimer 11/16/2016, 9:51 AM  Elwyn Reach, [place]

## 2016-11-16 NOTE — Progress Notes (Signed)
11/16/2016 1430 Received pt to room 2W05 from [place].  Pt is A&O, no C/O voiced.  Tele monitor applied and CCMD notified.  Oriented to room, call light and bed.  Call bell in reach. [place]

## 2016-11-16 NOTE — Care Management Note (Signed)
Case Management Note  Patient Details  Name: KALYN HOFSTRA MRN: [id number] Date of Birth: October 31, 1928  Subjective/Objective:    CHF                Action/Plan: Discharge Planning: NCM spoke to pt and dtr, [name] at bedside. Pt lives in the home with dtr. She has a Rollator, elevated commode seat, shower chair, and lift recliner. Pt states she has been to [facility] in the past. Pt agreeable to SNF -rehab. CSW referral for SNF. Waiting PT recommendations.  PCP [name] I MD  Expected Discharge Date:                  Expected Discharge Plan:  [place]  In-House Referral:  Clinical Social Work  Discharge planning Services  CM Consult  Post Acute Care Choice:  NA Choice offered to:  NA  DME Arranged:  N/A DME Agency:  NA  HH Arranged:  NA HH Agency:  NA (had [facility] at home int the past)  Status of Service:  In process, will continue to follow  If discussed at Long Length of Stay Meetings, dates discussed:    Additional Comments:  Erenest Rasher, RN 11/16/2016, 5:38 PM

## 2016-11-17 LAB — CBC
HCT: 30.4 % — ABNORMAL LOW (ref 36.0–46.0)
HEMOGLOBIN: 9.9 g/dL — AB (ref 12.0–15.0)
MCH: 31.8 pg (ref 26.0–34.0)
MCHC: 32.6 g/dL (ref 30.0–36.0)
MCV: 97.7 fL (ref 78.0–100.0)
PLATELETS: 131 10*3/uL — AB (ref 150–400)
RBC: 3.11 MIL/uL — AB (ref 3.87–5.11)
RDW: 13 % (ref 11.5–15.5)
WBC: 6.7 10*3/uL (ref 4.0–10.5)

## 2016-11-17 LAB — BASIC METABOLIC PANEL
ANION GAP: 8 (ref 5–15)
BUN: 32 mg/dL — AB (ref 6–20)
CO2: 27 mmol/L (ref 22–32)
Calcium: 8 mg/dL — ABNORMAL LOW (ref 8.9–10.3)
Chloride: 94 mmol/L — ABNORMAL LOW (ref 101–111)
Creatinine, Ser: 1.34 mg/dL — ABNORMAL HIGH (ref 0.44–1.00)
GFR calc Af Amer: 41 mL/min — ABNORMAL LOW (ref >60)
GFR, EST NON AFRICAN AMERICAN: 35 mL/min — AB (ref >60)
Glucose, Bld: 104 mg/dL — ABNORMAL HIGH (ref 65–99)
POTASSIUM: 4 mmol/L (ref 3.5–5.1)
SODIUM: 129 mmol/L — AB (ref 135–145)

## 2016-11-17 MED ORDER — FERROUS SULFATE 325 (65 FE) MG PO TABS
325.0000 mg | ORAL_TABLET | Freq: Three times a day (TID) | ORAL | Status: DC
  Administered 2016-11-17 – 2016-11-19 (×6): 325 mg via ORAL
  Filled 2016-11-17 (×7): qty 1

## 2016-11-17 NOTE — Progress Notes (Signed)
Subjective:  patient complains of feeling tired and fatigued, and no energy.  Denies any chest pain.  Also complains of insomnia.  Objective:  Vital Signs in the last 24 hours: Temp:  [97.6 F (36.4 C)-98.6 F (37 C)] 98.2 F (36.8 C) (05/08 0434) Pulse Rate:  [70-80] 80 (05/08 0434) Resp:  [18] 18 (05/08 0434) BP: (95-126)/(48-63) 126/63 (05/08 0434) SpO2:  [93 %-95 %] 93 % (05/08 0434)  Intake/Output from previous day: 05/07 0701 - 05/08 0700 In: 414.1 [P.O.:390; I.V.:24.1] Out: 600 [Urine:600] Intake/Output from this shift: Total I/O In: 240 [P.O.:240] Out: -   Physical Exam: Neck: no adenopathy, no carotid bruit, no JVD and supple, symmetrical, trachea midline Lungs: faint crackles at the bases Heart: faint crackles at the bases Abdomen: soft, non-tender; bowel sounds normal; no masses,  no organomegaly Extremities: extremities normal, atraumatic, no cyanosis or edema  Lab Results:  Recent Labs  11/16/16 0206 11/17/16 0242  WBC 11.7* 6.7  HGB 10.1* 9.9*  PLT 127* 131*    Recent Labs  11/14/16 1555 11/17/16 0242  NA 132* 129*  K 4.4 4.0  CL 95* 94*  CO2 25 27  GLUCOSE 162* 104*  BUN 17 32*  CREATININE 1.18* 1.34*    Recent Labs  11/14/16 2121 11/15/16 0355  TROPONINI 0.61* 0.65*   Hepatic Function Panel  Recent Labs  11/14/16 1310  PROT 6.7  ALBUMIN 3.8  AST 30  ALT 11*  ALKPHOS 51  BILITOT 0.9    Recent Labs  11/14/16 1310  CHOL 167   No results for input(s): PROTIME in the last 72 hours.  Imaging: Imaging results have been reviewed and No results found.  Cardiac Studies:  Assessment/Plan:  Resolving acute decompensated systolic heart failure Coronary artery disease history of MI 2 in the past status post PCI to LAD and RCA in the past Hypertension Hyperlipidemia Ischemic cardiomyopathy COPD History of spinal stenosis History of compression fracture T12 . History of cerebrovascular disease History of CVA of  breast Vitamin D deficiency Osteoporosis Chronic kidney disease stage III in the past Acute on chronic anemia, rule out GI loss. Plan As per orders. Awaiting skilled nursing facility  LOS: 3 days    Charolette Forward 11/17/2016, 9:43 AM

## 2016-11-17 NOTE — Clinical Social Work Placement (Signed)
   CLINICAL SOCIAL WORK PLACEMENT  NOTE  Date:  11/17/2016  Patient Details  Name: Haley Harris MRN: [id number] Date of Birth: Feb 26, 1929  Clinical Social Work is seeking post-discharge placement for this patient at the [place] level of care (*CSW will initial, date and re-position this form in  chart as items are completed):  Yes   Patient/family provided with [facility] Work Department's list of facilities offering this level of care within the geographic area requested by the patient (or if unable, by the patient's family).  Yes   Patient/family informed of their freedom to choose among providers that offer the needed level of care, that participate in Medicare, Medicaid or managed care program needed by the patient, have an available bed and are willing to accept the patient.  Yes   Patient/family informed of [facility]'s ownership interest in [facility] and [facility] as well as of the fact that they are under no obligation to receive care at these facilities.  PASRR submitted to EDS on       PASRR number received on       Existing PASRR number confirmed on 11/17/16     FL2 transmitted to all facilities in geographic area requested by pt/family on 11/17/16     FL2 transmitted to all facilities within larger geographic area on       Patient informed that his/her managed care company has contracts with or will negotiate with certain facilities, including the following:            Patient/family informed of bed offers received.  Patient chooses bed at       Physician recommends and patient chooses bed at      Patient to be transferred to   on  .  Patient to be transferred to facility by       Patient family notified on   of transfer.  Name of family member notified:        PHYSICIAN Please sign FL2     Additional Comment:    _______________________________________________ Lilly [place], LCSW 11/17/2016, 10:01 AM

## 2016-11-17 NOTE — Evaluation (Signed)
Physical Therapy Evaluation Patient Details Name: Haley Harris MRN: [id number] DOB: Nov 20, 1928 Today's Date: 11/17/2016   History of Present Illness  83 yo admitted with SOB and pulmonary edema. PMHx: CHF, COPD, HTN, HLD, ICM, spinal stenosis  Clinical Impression  Pt pleasant and reports fatigue with all mobility and functional activity at home. Pt with sats 90-94% on RA with limited ambulation and no talking. Pt reports daughter does not provide assist at home and does not have comprehension of pt medical status and limited activity tolerance. Pt states she cannot afford ALF long term but given pt status and concerns this appears to be the best long term solution and discussed this with pt as well as need for conversation with family. Pt with decreased strength, gait and activity tolerance currently who will benefit from acute therapy to maximize mobility, balance and independence. Pt requesting SNF due to lack of family support.   HR 85-94    Follow Up Recommendations SNF    Equipment Recommendations  None recommended by PT    Recommendations for Other Services       Precautions / Restrictions Precautions Precautions: Fall      Mobility  Bed Mobility Overal bed mobility: Modified Independent                Transfers Overall transfer level: Modified independent                  Ambulation/Gait Ambulation/Gait assistance: Supervision Ambulation Distance (Feet): 120 Feet Assistive device: Rolling walker (2 wheeled) Gait Pattern/deviations: Step-through pattern;Trunk flexed;Decreased stride length   Gait velocity interpretation: Below normal speed for age/gender General Gait Details: cues for posture, position in RW, pursed lip breathing. pt able to maintain sats >90% on RA without talking  Stairs            Wheelchair Mobility    Modified Rankin (Stroke Patients Only)       Balance Overall balance assessment: History of Falls                                            Pertinent Vitals/Pain Pain Assessment: No/denies pain    Home Living Family/patient expects to be discharged to:: Private residence Living Arrangements: Children Available Help at Discharge: Family;Available PRN/intermittently Type of Home: House Home Access: Stairs to enter Entrance Stairs-Rails: Can reach both;Left;Right   Home Layout: Two level;Able to live on main level with bedroom/bathroom Home Equipment: Walker - 4 wheels;Shower seat;Transport chair;Toilet riser;Hand held shower head      Prior Function Level of Independence: Independent with assistive device(s)         Comments: uses rollator at home, cooks, 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, international aid [employer]        Extremity/Trunk Assessment   Upper Extremity Assessment Upper Extremity Assessment: Generalized weakness    Lower Extremity Assessment Lower Extremity Assessment: Generalized weakness    Cervical / Trunk Assessment Cervical / Trunk Assessment: Kyphotic (forward head)  Communication   Communication: No difficulties  Cognition Arousal/Alertness: Awake/alert Behavior During Therapy: WFL for tasks assessed/performed Overall Cognitive Status: Within Functional Limits for tasks assessed                                        General Comments  Exercises     Assessment/Plan    PT Assessment Patient needs continued PT services  PT Problem List Decreased mobility;Decreased strength;Decreased activity tolerance;Decreased balance;Decreased knowledge of use of DME;Cardiopulmonary status limiting activity       PT Treatment Interventions Gait training;Therapeutic exercise;Patient/family education;Stair training;Balance training;Functional mobility training;DME instruction;Therapeutic activities    PT Goals (Current goals can be found in the Care Plan section)  Acute Rehab PT Goals Patient Stated Goal: be able to do for myself PT Goal Formulation:  With patient Time For Goal Achievement: 11/24/16 Potential to Achieve Goals: Fair    Frequency Min 3X/week   Barriers to discharge Decreased caregiver support daughter lives with pt but pt reports she has eratic schedule and not consistently home    Co-evaluation               AM-PAC PT "6 Clicks" Daily Activity  Outcome Measure Difficulty turning over in bed (including adjusting bedclothes, sheets and blankets)?: A Little Difficulty moving from lying on back to sitting on the side of the bed? : A Little Difficulty sitting down on and standing up from a chair with arms (e.g., wheelchair, bedside commode, etc,.)?: A Little Help needed moving to and from a bed to chair (including a wheelchair)?: None Help needed walking in hospital room?: A Little Help needed climbing 3-5 steps with a railing? : A Little 6 Click Score: 19    End of Session Equipment Utilized During Treatment: Gait belt Activity Tolerance: Patient tolerated treatment well Patient left: in chair;with call bell/phone within reach Nurse Communication: Mobility status PT Visit Diagnosis: Difficulty in walking, not elsewhere classified (R26.2)    Time: 7564-3329 PT Time Calculation (min) (ACUTE ONLY): 27 min   Charges:   PT Evaluation $PT Eval Moderate Complexity: 1 Procedure     PT G Codes:        Elwyn Reach, [place]   [place] 11/17/2016, 10:46 AM

## 2016-11-17 NOTE — NC FL2 (Signed)
[place] LEVEL OF CARE SCREENING TOOL     IDENTIFICATION  Patient Name: Haley Harris Birthdate: 08-15-1928 Sex: female Admission Date (Current Location): 11/14/2016  [facility] and [place] Number:  Herbalist and Address:  [facility]. [facility], [place] [address], [place], [place] [address]      Provider Number: [id number]  Attending Physician Name and Address:  Charolette Forward, MD  Relative Name and Phone Number:   Butch Penny  [phone]    Current Level of Care: Hospital Recommended Level of Care: [place] Prior Approval Number:    Date Approved/Denied:   PASRR Number:   [id number]   Discharge Plan: SNF    Current Diagnoses: Patient Active Problem List   Diagnosis Date Noted  . Acute pulmonary edema ([place]) 11/14/2016  . Osteoarthritis of left hip 09/28/2016  . Metatarsal stress fracture with routine healing 08/04/2016  . Cervical spondylosis without myelopathy 07/31/2016  . Primary osteoarthritis of both first carpometacarpal joints 07/31/2016  . Acute on chronic combined systolic and diastolic heart failure ([place]) 07/22/2016  . Spinal stenosis in cervical region 05/29/2016  . Venous stasis dermatitis 11/13/2015  . Essential hypertension 10/30/2015  . Diverticulitis of colon 02/18/2015  . Chronic combined systolic and diastolic CHF (congestive heart failure) ([place])   . Chronic kidney disease, stage III (moderate)   . PVD (peripheral vascular disease) ([place])   . Right adrenal mass ([place]) 01/18/2015  . Chronic venous insufficiency 12/18/2014  . Advanced directives, counseling/discussion 04/02/2014  . Neuropathy due to peripheral vascular disease ([place]) 03/11/2014  . Chronic combined systolic and diastolic CHF, NYHA class 1 ([place]) 03/06/2014  . Routine general medical examination at a health care facility 03/02/2013  . Constipation due to pain medication 10/28/2011  . CAD (coronary artery disease), native coronary artery  06/05/2011  . COPD exacerbation ([place]) 10/02/2010  . Dyslipidemia 10/30/2006  . Episodic mood disorder ([place]) 10/30/2006  . COPD (chronic obstructive pulmonary disease) ([place]) 10/30/2006  . GERD 10/30/2006  . Osteoarthritis 10/30/2006  . Osteoporosis 10/30/2006  . URINARY INCONTINENCE 10/30/2006    Orientation RESPIRATION BLADDER Height & Weight     Self, Time, Situation, Place  Normal Continent Weight: 175 lb 3.2 oz (79.5 kg) Height:  5\' 6"  (167.6 cm)  BEHAVIORAL SYMPTOMS/MOOD NEUROLOGICAL BOWEL NUTRITION STATUS      Continent Diet (See summary)  AMBULATORY STATUS COMMUNICATION OF NEEDS Skin   Extensive Assist Verbally Normal                       Personal Care Assistance Level of Assistance  Bathing, Feeding, Dressing Bathing Assistance: Limited assistance Feeding assistance: Limited assistance Dressing Assistance: Limited assistance     Functional Limitations Info  Sight, Hearing, Speech Sight Info: Adequate Hearing Info: Impaired Speech Info: Adequate    SPECIAL CARE FACTORS FREQUENCY  PT (By licensed PT), OT (By licensed OT)     PT Frequency: 5x OT Frequency: 5x            Contractures Contractures Info: Not present    Additional Factors Info  Code Status, Allergies Code Status Info: Full Code Allergies Info: Amoxicillin-pot Clavulanate, Penicillins, Cephalexin, Clarithromycin, Lidocaine, Nifedipine, Nsaids, Olmesartan Medoxomil, Tramadol Hcl           Current Medications (11/17/2016):  This is the current hospital active medication list Current Facility-Administered Medications  Medication Dose Route Frequency Provider Last Rate Last Dose  . 0.9 %  sodium chloride infusion   Intravenous  Continuous Tegeler, Gwenyth Allegra, MD      . 0.9 %  sodium chloride infusion  250 mL Intravenous PRN Charolette Forward, MD 10 mL/hr at 11/14/16 1900 250 mL at 11/14/16 1900  . acetaminophen (TYLENOL) tablet 650 mg  650 mg Oral Q4H PRN Charolette Forward, MD   650 mg at  11/15/16 2112  . albuterol (PROVENTIL) (2.5 MG/3ML) 0.083% nebulizer solution 2.5 mg  2.5 mg Nebulization Q4H PRN Charolette Forward, MD      . aspirin EC tablet 81 mg  81 mg Oral Daily Charolette Forward, MD   81 mg at 11/16/16 0951  . atorvastatin (LIPITOR) tablet 40 mg  40 mg Oral q1800 Charolette Forward, MD   40 mg at 11/16/16 1800  . bisacodyl (DULCOLAX) EC tablet 10 mg  10 mg Oral Daily PRN Charolette Forward, MD   10 mg at 11/16/16 2109  . escitalopram (LEXAPRO) tablet 10 mg  10 mg Oral Daily Charolette Forward, MD   10 mg at 11/16/16 0951  . famotidine (PEPCID) tablet 20 mg  20 mg Oral BID Charolette Forward, MD   20 mg at 11/16/16 2109  . ferrous sulfate tablet 325 mg  325 mg Oral TID WC Charolette Forward, MD      . furosemide (LASIX) tablet 40 mg  40 mg Oral Daily Charolette Forward, MD   40 mg at 11/16/16 1050  . levalbuterol (XOPENEX) nebulizer solution 1.25 mg  1.25 mg Nebulization TID Charolette Forward, MD   1.25 mg at 11/16/16 1933  . losartan (COZAAR) tablet 50 mg  50 mg Oral Daily Charolette Forward, MD   50 mg at 11/16/16 0951  . metoprolol tartrate (LOPRESSOR) tablet 12.5 mg  12.5 mg Oral BID Charolette Forward, MD   12.5 mg at 11/16/16 2109  . nitroGLYCERIN (NITROSTAT) SL tablet 0.4 mg  0.4 mg Sublingual Q5 Min x 3 PRN Charolette Forward, MD      . ondansetron (ZOFRAN) injection 4 mg  4 mg Intravenous Q6H PRN Charolette Forward, MD      . sodium chloride flush (NS) 0.9 % injection 3 mL  3 mL Intravenous Q12H Charolette Forward, MD   3 mL at 11/16/16 2110  . sodium chloride flush (NS) 0.9 % injection 3 mL  3 mL Intravenous PRN Charolette Forward, MD         Discharge Medications: Please see discharge summary for a list of discharge medications.  Relevant Imaging Results:  Relevant Lab Results:   Additional Information SSN: [id number]    Lilly Cove, [place]

## 2016-11-17 NOTE — Clinical Social Work Note (Signed)
Clinical Social Work Assessment  Patient Details  Name: Haley Harris MRN: [id number] Date of Birth: [birth year]  Date of referral:  11/17/16               Reason for consult:  Facility Placement                Permission sought to share information with:  Case Manager, Facility Sport and exercise psychologist, Family Supports Permission granted to share information::  Yes, Verbal Permission Granted  Name::        Agency::     Relationship::  Daughter, Haley Harris [phone]  Contact Information:     Housing/Transportation Living arrangements for the past 2 months:  [place] of Information:  Patient, Medical Team, Case Manager, Adult Children Patient Interpreter Needed:  None Criminal Activity/Legal Involvement Pertinent to Current Situation/Hospitalization:  No - Comment as needed Significant Relationships:  Adult Children, Other Family Members Lives with:  Self Do you feel safe going back to the place where you live?  No Need for family participation in patient care:  Yes (Comment)  Care giving concerns:  Patient coming from home and wanting short term rehab as daughter reports she works full time and patient needs more assistance and supervision with tasks/physical therapy prior to returning home.  Daughter reports patient completed therapy at [facility] last year and both were pleased with progress and care, thus would like her to return at discharge.   Social Worker assessment / plan:  LCSW completed assessment with patient at the bedside and daughter on the phone (patient was on phone when LCSW arrived). Both agreeable to plan. Wanting SNF at [facility].  SNF work up completed. Will follow up with bed offers and acute hospitalization and assist with transfer to SNF when medically stable.  Employment status:  Retired Nurse, adult PT Recommendations:  [place] / Referral to community resources:     Patient/Family's  Response to care:  Agreeable to plan  Patient/Family's Understanding of and Emotional Response to Diagnosis, Current Treatment, and Prognosis:  Daughter verbalizes relief with plan and discussion. She is able to explain current treatment and needs at this time.   Emotional Assessment Appearance:  Appears stated age Attitude/Demeanor/Rapport:    Affect (typically observed):  Accepting, Adaptable Orientation:  Oriented to Self, Oriented to Place, Oriented to  Time, Oriented to Situation Alcohol / Substance use:  Not Applicable Psych involvement (Current and /or in the community):  No (Comment)  Discharge Needs  Concerns to be addressed:  No discharge needs identified Readmission within the last 30 days:  No Current discharge risk:  None Barriers to Discharge:  No Barriers Identified   Lilly Cove, LCSW 11/17/2016, 10:03 AM

## 2016-11-18 ENCOUNTER — Encounter (HOSPITAL_COMMUNITY): Payer: Self-pay

## 2016-11-18 LAB — CBC
HCT: 31.6 % — ABNORMAL LOW (ref 36.0–46.0)
HEMOGLOBIN: 10.3 g/dL — AB (ref 12.0–15.0)
MCH: 31.7 pg (ref 26.0–34.0)
MCHC: 32.6 g/dL (ref 30.0–36.0)
MCV: 97.2 fL (ref 78.0–100.0)
Platelets: 137 10*3/uL — ABNORMAL LOW (ref 150–400)
RBC: 3.25 MIL/uL — AB (ref 3.87–5.11)
RDW: 13 % (ref 11.5–15.5)
WBC: 8.1 10*3/uL (ref 4.0–10.5)

## 2016-11-18 NOTE — Care Management Important Message (Signed)
Important Message  Patient Details  Name: Haley Harris MRN: [id number] Date of Birth: 10-24-1928   Medicare Important Message Given:  Yes    Merridith Dershem Abena 11/18/2016, 11:20 AM

## 2016-11-18 NOTE — Consult Note (Signed)
   [facility] CM Inpatient Consult   11/18/2016  Haley Harris [birth year] [id number]      Patient screened for potential [facility] Care Management services. Chart reviewed. Noted current discharge plan is for  SNF. Confirmed with inpatient RNCM.    There are no identifiable [facility] Care Management needs at this time. If patient's post hospital needs change, please place a [facility] Care Management consult.  Marthenia Rolling, MSN-Ed, RN,BSN [facility] Liaison [phone]

## 2016-11-18 NOTE — Progress Notes (Signed)
Subjective:  Patient denies any chest pain states breathing has improved.  Objective:  Vital Signs in the last 24 hours: Temp:  [97.8 F (36.6 C)-98.5 F (36.9 C)] 98.1 F (36.7 C) (05/09 0509) Pulse Rate:  [70-80] 79 (05/09 0509) Resp:  [18-20] 20 (05/09 0509) BP: (97-153)/(51-76) 153/76 (05/09 0509) SpO2:  [94 %-97 %] 95 % (05/09 0926)  Intake/Output from previous day: 05/08 0701 - 05/09 0700 In: 720 [P.O.:720] Out: -  Intake/Output from this shift: Total I/O In: 240 [P.O.:240] Out: -   Physical Exam: Neck: no adenopathy, no carotid bruit, no JVD and supple, symmetrical, trachea midline Lungs: clear to auscultation bilaterally Heart: regular rate and rhythm, S1, S2 normal and Soft systolic murmur noted Abdomen: soft, non-tender; bowel sounds normal; no masses,  no organomegaly Extremities: extremities normal, atraumatic, no cyanosis or edema  Lab Results:  Recent Labs  11/17/16 0242 11/18/16 0358  WBC 6.7 8.1  HGB 9.9* 10.3*  PLT 131* 137*    Recent Labs  11/17/16 0242  NA 129*  K 4.0  CL 94*  CO2 27  GLUCOSE 104*  BUN 32*  CREATININE 1.34*   No results for input(s): TROPONINI in the last 72 hours.  Invalid input(s): CK, MB Hepatic Function Panel No results for input(s): PROT, ALBUMIN, AST, ALT, ALKPHOS, BILITOT, BILIDIR, IBILI in the last 72 hours. No results for input(s): CHOL in the last 72 hours. No results for input(s): PROTIME in the last 72 hours.  Imaging: Imaging results have been reviewed and No results found.  Cardiac Studies:  Assessment/Plan:  Compensated systolic heart failure Coronary artery disease history of MI 2 in the past status post PCI to LAD and RCA in the past Hypertension Hyperlipidemia Ischemic cardiomyopathy COPD History of spinal stenosis History of compression fracture T12 History of cerebrovascular disease History of CVA of breast Vitamin D deficiency Osteoporosis Chronic kidney disease stage III in the  past Acute on chronic anemia, stable Plan Continue present management Awaiting skilled nursing facility Increase ambulation as tolerated  LOS: 4 days    Charolette Forward 11/18/2016, 12:23 PM

## 2016-11-18 NOTE — Progress Notes (Signed)
Patient sleeping. Asymptomatic. Denies any pain on assessment. Will continue to monitor.

## 2016-11-19 DIAGNOSIS — Z5189 Encounter for other specified aftercare: Secondary | ICD-10-CM | POA: Diagnosis not present

## 2016-11-19 DIAGNOSIS — J810 Acute pulmonary edema: Secondary | ICD-10-CM | POA: Diagnosis not present

## 2016-11-19 DIAGNOSIS — J80 Acute respiratory distress syndrome: Secondary | ICD-10-CM | POA: Diagnosis not present

## 2016-11-19 DIAGNOSIS — I509 Heart failure, unspecified: Secondary | ICD-10-CM | POA: Diagnosis not present

## 2016-11-19 DIAGNOSIS — M818 Other osteoporosis without current pathological fracture: Secondary | ICD-10-CM | POA: Diagnosis not present

## 2016-11-19 DIAGNOSIS — G4700 Insomnia, unspecified: Secondary | ICD-10-CM | POA: Diagnosis not present

## 2016-11-19 DIAGNOSIS — I5042 Chronic combined systolic (congestive) and diastolic (congestive) heart failure: Secondary | ICD-10-CM | POA: Diagnosis not present

## 2016-11-19 DIAGNOSIS — I1310 Hypertensive heart and chronic kidney disease without heart failure, with stage 1 through stage 4 chronic kidney disease, or unspecified chronic kidney disease: Secondary | ICD-10-CM | POA: Diagnosis not present

## 2016-11-19 DIAGNOSIS — I252 Old myocardial infarction: Secondary | ICD-10-CM | POA: Diagnosis not present

## 2016-11-19 DIAGNOSIS — M542 Cervicalgia: Secondary | ICD-10-CM | POA: Diagnosis not present

## 2016-11-19 DIAGNOSIS — I25111 Atherosclerotic heart disease of native coronary artery with angina pectoris with documented spasm: Secondary | ICD-10-CM | POA: Diagnosis not present

## 2016-11-19 DIAGNOSIS — I2510 Atherosclerotic heart disease of native coronary artery without angina pectoris: Secondary | ICD-10-CM | POA: Diagnosis not present

## 2016-11-19 DIAGNOSIS — I739 Peripheral vascular disease, unspecified: Secondary | ICD-10-CM | POA: Diagnosis not present

## 2016-11-19 DIAGNOSIS — I110 Hypertensive heart disease with heart failure: Secondary | ICD-10-CM | POA: Diagnosis not present

## 2016-11-19 DIAGNOSIS — R278 Other lack of coordination: Secondary | ICD-10-CM | POA: Diagnosis not present

## 2016-11-19 DIAGNOSIS — M549 Dorsalgia, unspecified: Secondary | ICD-10-CM | POA: Diagnosis not present

## 2016-11-19 DIAGNOSIS — M6281 Muscle weakness (generalized): Secondary | ICD-10-CM | POA: Diagnosis not present

## 2016-11-19 DIAGNOSIS — N183 Chronic kidney disease, stage 3 (moderate): Secondary | ICD-10-CM | POA: Diagnosis not present

## 2016-11-19 DIAGNOSIS — R2681 Unsteadiness on feet: Secondary | ICD-10-CM | POA: Diagnosis not present

## 2016-11-19 DIAGNOSIS — Z9181 History of falling: Secondary | ICD-10-CM | POA: Diagnosis not present

## 2016-11-19 DIAGNOSIS — R531 Weakness: Secondary | ICD-10-CM | POA: Diagnosis not present

## 2016-11-19 DIAGNOSIS — I249 Acute ischemic heart disease, unspecified: Secondary | ICD-10-CM | POA: Diagnosis not present

## 2016-11-19 DIAGNOSIS — G8929 Other chronic pain: Secondary | ICD-10-CM | POA: Diagnosis not present

## 2016-11-19 LAB — CBC
HCT: 35 % — ABNORMAL LOW (ref 36.0–46.0)
HEMOGLOBIN: 11.3 g/dL — AB (ref 12.0–15.0)
MCH: 31.7 pg (ref 26.0–34.0)
MCHC: 32.3 g/dL (ref 30.0–36.0)
MCV: 98 fL (ref 78.0–100.0)
Platelets: 138 10*3/uL — ABNORMAL LOW (ref 150–400)
RBC: 3.57 MIL/uL — ABNORMAL LOW (ref 3.87–5.11)
RDW: 13.2 % (ref 11.5–15.5)
WBC: 7.2 10*3/uL (ref 4.0–10.5)

## 2016-11-19 MED ORDER — FERROUS SULFATE 325 (65 FE) MG PO TABS: 325.0000 mg | ORAL_TABLET | Freq: Three times a day (TID) | ORAL | 3 refills | Status: DC

## 2016-11-19 MED ORDER — BISACODYL 5 MG PO TBEC: 10.0000 mg | DELAYED_RELEASE_TABLET | Freq: Every day | ORAL | 0 refills | Status: DC | PRN

## 2016-11-19 MED ORDER — CARVEDILOL 6.25 MG PO TABS: 6.2500 mg | ORAL_TABLET | Freq: Two times a day (BID) | ORAL | 3 refills | Status: AC

## 2016-11-19 MED ORDER — ATORVASTATIN CALCIUM 40 MG PO TABS: 40.0000 mg | ORAL_TABLET | Freq: Every day | ORAL | 3 refills | Status: DC

## 2016-11-19 MED ORDER — FUROSEMIDE 40 MG PO TABS: 40.0000 mg | ORAL_TABLET | Freq: Two times a day (BID) | ORAL | 3 refills | Status: DC

## 2016-11-19 MED ORDER — NITROGLYCERIN 0.4 MG SL SUBL: 0.4000 mg | SUBLINGUAL_TABLET | SUBLINGUAL | 12 refills | Status: DC | PRN

## 2016-11-19 NOTE — Progress Notes (Signed)
Clinical Social Worker facilitated patient discharge including contacting patient family and facility to confirm patient discharge plans.  Clinical information faxed to facility and family agreeable with plan.  CSW arranged ambulance transport via [facility] to [facility]  RN Lauren to call [phone]) report prior to discharge.  Clinical Social Worker will sign off for now as social work intervention is no longer needed. Please consult [place] again if new need arises.  Haley Harris, MSW, [place]

## 2016-11-19 NOTE — Discharge Instructions (Signed)
Heart Failure °Heart failure is a condition in which the heart has trouble pumping blood because it has become weak or stiff. This means that the heart does not pump blood efficiently for the body to work well. For some people with heart failure, fluid may back up into the lungs and there may be swelling (edema) in the lower legs. Heart failure is usually a long-term (chronic) condition. It is important for you to take good care of yourself and follow the treatment plan from your health care provider. °What are the causes? °This condition is caused by some health problems, including: °· High blood pressure (hypertension). Hypertension causes the heart muscle to work harder than normal. High blood pressure eventually causes the heart to become stiff and weak. °· Coronary artery disease (CAD). CAD is the buildup of cholesterol and fat (plaques) in the arteries of the heart. °· Heart attack (myocardial infarction). Injured tissue, which is caused by the heart attack, does not contract as well and the heart's ability to pump blood is weakened. °· Abnormal heart valves. When the heart valves do not open and close properly, the heart muscle must pump harder to keep the blood flowing. °· Heart muscle disease (cardiomyopathy or myocarditis). Heart muscle disease is damage to the heart muscle from a variety of causes, such as drug or alcohol abuse, infections, or unknown causes. These can increase the risk of heart failure. °· Lung disease. When the lungs do not work properly, the heart must work harder. ° °What increases the risk? °Risk of heart failure increases as a person ages. This condition is also more likely to develop in people who: °· Are overweight. °· Are female. °· Smoke or chew tobacco. °· Abuse alcohol or illegal drugs. °· Have taken medicines that can damage the heart, such as chemotherapy drugs. °· Have diabetes. °? High blood sugar (glucose) is associated with high fat (lipid) levels in the blood. °? Diabetes  can also damage tiny blood vessels that carry nutrients to the heart muscle. °· Have abnormal heart rhythms. °· Have thyroid problems. °· Have low blood counts (anemia). ° °What are the signs or symptoms? °Symptoms of this condition include: °· Shortness of breath with activity, such as when climbing stairs. °· Persistent cough. °· Swelling of the feet, ankles, legs, or abdomen. °· Unexplained weight gain. °· Difficulty breathing when lying flat (orthopnea). °· Waking from sleep because of the need to sit up and get more air. °· Rapid heartbeat. °· Fatigue and loss of energy. °· Feeling light-headed, dizzy, or close to fainting. °· Loss of appetite. °· Nausea. °· Increased urination during the night (nocturia). °· Confusion. ° °How is this diagnosed? °This condition is diagnosed based on: °· Medical history, symptoms, and a physical exam. °· Diagnostic tests, which may include: °? Echocardiogram. °? Electrocardiogram (ECG). °? Chest X-ray. °? Blood tests. °? Exercise stress test. °? Radionuclide scans. °? Cardiac catheterization and angiogram. ° °How is this treated? °Treatment for this condition is aimed at managing the symptoms of heart failure. Medicines, behavioral changes, or other treatments may be necessary to treat heart failure. °Medicines °These may include: °· Angiotensin-converting enzyme (ACE) inhibitors. This type of medicine blocks the effects of a blood protein called angiotensin-converting enzyme. ACE inhibitors relax (dilate) the blood vessels and help to lower blood pressure. °· Angiotensin receptor blockers (ARBs). This type of medicine blocks the actions of a blood protein called angiotensin. ARBs dilate the blood vessels and help to lower blood pressure. °· Water   pills (diuretics). Diuretics cause the kidneys to remove salt and water from the blood. The extra fluid is removed through urination, leaving a lower volume of blood that the heart has to pump. °· Beta blockers. These improve heart  muscle strength and they prevent the heart from beating too quickly. °· Digoxin. This increases the force of the heartbeat. ° °Healthy behavior changes °These may include: °· Reaching and maintaining a healthy weight. °· Stopping smoking or chewing tobacco. °· Eating heart-healthy foods. °· Limiting or avoiding alcohol. °· Stopping use of street drugs (illegal drugs). °· Physical activity. ° °Other treatments °These may include: °· Surgery to open blocked coronary arteries or repair damaged heart valves. °· Placement of a biventricular pacemaker to improve heart muscle function (cardiac resynchronization therapy). This device paces both the right ventricle and left ventricle. °· Placement of a device to treat serious abnormal heart rhythms (implantable cardioverter defibrillator, or ICD). °· Placement of a device to improve the pumping ability of the heart (left ventricular assist device, or LVAD). °· Heart transplant. This can cure heart failure, and it is considered for certain patients who do not improve with other therapies. ° °Follow these instructions at home: °Medicines °· Take over-the-counter and prescription medicines only as told by your health care provider. Medicines are important in reducing the workload of your heart, slowing the progression of heart failure, and improving your symptoms. °? Do not stop taking your medicine unless your health care provider told you to do that. °? Do not skip any dose of medicine. °? Refill your prescriptions before you run out of medicine. You need your medicines every day. °Eating and drinking ° °· Eat heart-healthy foods. Talk with a dietitian to make an eating plan that is right for you. °? Choose foods that contain no trans fat and are low in saturated fat and cholesterol. Healthy choices include fresh or frozen fruits and vegetables, fish, lean meats, legumes, fat-free or low-fat dairy products, and whole-grain or high-fiber foods. °? Limit salt (sodium) if  directed by your health care provider. Sodium restriction may reduce symptoms of heart failure. Ask a dietitian to recommend heart-healthy seasonings. °? Use healthy cooking methods instead of frying. Healthy methods include roasting, grilling, broiling, baking, poaching, steaming, and stir-frying. °· Limit your fluid intake if directed by your health care provider. Fluid restriction may reduce symptoms of heart failure. °Lifestyle °· Stop smoking or using chewing tobacco. Nicotine and tobacco can damage your heart and your blood vessels. Do not use nicotine gum or patches before talking to your health care provider. °· Limit alcohol intake to no more than 1 drink per day for non-pregnant women and 2 drinks per day for men. One drink equals 12 oz of beer, 5 oz of wine, or 1½ oz of hard liquor. °? Drinking more than that is harmful to your heart. Tell your health care provider if you drink alcohol several times a week. °? Talk with your health care provider about whether any level of alcohol use is safe for you. °? If your heart has already been damaged by alcohol or you have severe heart failure, drinking alcohol should be stopped completely. °· Stop use of illegal drugs. °· Lose weight if directed by your health care provider. Weight loss may reduce symptoms of heart failure. °· Do moderate physical activity if directed by your health care provider. People who are elderly and people with severe heart failure should consult with a health care provider for physical activity recommendations. °  Monitor important information °· Weigh yourself every day. Keeping track of your weight daily helps you to notice excess fluid sooner. °? Weigh yourself every morning after you urinate and before you eat breakfast. °? Wear the same amount of clothing each time you weigh yourself. °? Record your daily weight. Provide your health care provider with your weight record. °· Monitor and record your blood pressure as told by your health  care provider. °· Check your pulse as told by your health care provider. °Dealing with extreme temperatures °· If the weather is extremely hot: °? Avoid vigorous physical activity. °? Use air conditioning or fans or seek a cooler location. °? Avoid caffeine and alcohol. °? Wear loose-fitting, lightweight, and light-colored clothing. °· If the weather is extremely cold: °? Avoid vigorous physical activity. °? Layer your clothes. °? Wear mittens or gloves, a hat, and a scarf when you go outside. °? Avoid alcohol. °General instructions °· Manage other health conditions such as hypertension, diabetes, thyroid disease, or abnormal heart rhythms as told by your health care provider. °· Learn to manage stress. If you need help to do this, ask your health care provider. °· Plan rest periods when fatigued. °· Get ongoing education and support as needed. °· Participate in or seek rehabilitation as needed to maintain or improve independence and quality of life. °· Stay up to date with immunizations. Keeping current on pneumococcal and influenza immunizations is especially important to prevent respiratory infections. °· Keep all follow-up visits as told by your health care provider. This is important. °Contact a health care provider if: °· You have a rapid weight gain. °· You have increasing shortness of breath that is unusual for you. °· You are unable to participate in your usual physical activities. °· You tire easily. °· You cough more than normal, especially with physical activity. °· You have any swelling or more swelling in areas such as your hands, feet, ankles, or abdomen. °· You are unable to sleep because it is hard to breathe. °· You feel like your heart is beating quickly (palpitations). °· You become dizzy or light-headed when you stand up. °Get help right away if: °· You have difficulty breathing. °· You notice or your family notices a change in your awareness, such as having trouble staying awake or having  difficulty with concentration. °· You have pain or discomfort in your chest. °· You have an episode of fainting (syncope). °This information is not intended to replace advice given to you by your health care provider. Make sure you discuss any questions you have with your health care provider. °Document Released: 06/29/2005 Document Revised: 03/03/2016 Document Reviewed: 01/22/2016 °Elsevier Interactive Patient Education © 2017 [employer] ° °

## 2016-11-19 NOTE — Discharge Summary (Signed)
Discharge summary dictated on 11/19/2016 dictation number is [phone]

## 2016-11-19 NOTE — Progress Notes (Signed)
Clinical Social Worker has sent over discharge summary to facility to help with patient discharge to [facility]. Facility stated that they needs the discharge summary to be signed by MD before accepting patient. CSW has paged MD several time as well as RN Lauren contacted MD office to try and get inconstant with MD for signature. Patient is ready for discharge but awaiting MD signature.  Rhea Pink, MSW,  [place]

## 2016-11-19 NOTE — Progress Notes (Signed)
Physical Therapy Treatment Patient Details Name: Haley Harris MRN: [id number] DOB: 1929-05-26 Today's Date: 11/19/2016    History of Present Illness 83 yo admitted with SOB and pulmonary edema. PMHx: CHF, COPD, HTN, HLD, ICM, spinal stenosis    PT Comments    Pt pleasant and moving well today with increased gait distance and maintained sats 90-95% on RA. Pt educated for HEP and reports she has not yet discussed assistance or long term plans with family and encouraged to do so. Will continue to follow.   Follow Up Recommendations  SNF     Equipment Recommendations  None recommended by PT    Recommendations for Other Services       Precautions / Restrictions Precautions Precautions: Fall    Mobility  Bed Mobility               General bed mobility comments: in chair on arrival  Transfers Overall transfer level: Modified independent                  Ambulation/Gait Ambulation/Gait assistance: Supervision Ambulation Distance (Feet): 160 Feet Assistive device: Rolling walker (2 wheeled) Gait Pattern/deviations: Step-through pattern;Trunk flexed;Decreased stride length   Gait velocity interpretation: Below normal speed for age/gender General Gait Details: cues for posture, position in RW, pursed lip breathing. pt able to maintain sats >90% on RA without talking   Stairs            Wheelchair Mobility    Modified Rankin (Stroke Patients Only)       Balance                                            Cognition Arousal/Alertness: Awake/alert Behavior During Therapy: WFL for tasks assessed/performed Overall Cognitive Status: Within Functional Limits for tasks assessed                                        Exercises General Exercises - Lower Extremity Long Arc Quad: AROM;Both;Seated;10 reps Hip ABduction/ADduction: AROM;Both;Seated;10 reps Hip Flexion/Marching: AROM;Both;Seated;10 reps Toe Raises:  AROM;Both;Seated;10 reps Heel Raises: AROM;Both;Seated;10 reps    General Comments        Pertinent Vitals/Pain Pain Assessment: 0-10 Pain Score: 5  Pain Location: chronic back and knee pain Pain Descriptors / Indicators: Aching Pain Intervention(s): Repositioned;Monitored during session    Home Living                      Prior Function            PT Goals (current goals can now be found in the care plan section) Progress towards PT goals: Progressing toward goals    Frequency           PT Plan Current plan remains appropriate    Co-evaluation              AM-PAC PT "6 Clicks" Daily Activity  Outcome Measure  Difficulty turning over in bed (including adjusting bedclothes, sheets and blankets)?: A Little Difficulty moving from lying on back to sitting on the side of the bed? : A Little Difficulty sitting down on and standing up from a chair with arms (e.g., wheelchair, bedside commode, etc,.)?: A Little Help needed moving to and from a bed to chair (including a wheelchair)?:  None Help needed walking in hospital room?: A Little Help needed climbing 3-5 steps with a railing? : A Little 6 Click Score: 19    End of Session   Activity Tolerance: Patient tolerated treatment well Patient left: in chair;with call bell/phone within reach Nurse Communication: Mobility status PT Visit Diagnosis: Difficulty in walking, not elsewhere classified (R26.2)     Time: 1206-1220 PT Time Calculation (min) (ACUTE ONLY): 14 min  Charges:  $Gait Training: 8-22 mins                    G Codes:       Elwyn Reach, PT [phone]   [place] 11/19/2016, 12:29 PM

## 2016-11-19 NOTE — NC FL2 (Signed)
[place] LEVEL OF CARE SCREENING TOOL     IDENTIFICATION  Patient Name: Haley Harris Birthdate: Dec 05, 1928 Sex: female Admission Date (Current Location): 11/14/2016  [facility] and [place] Number:  Herbalist and Address:  [facility], [place] [address] [place], [place] [address]      Provider Number: [id number]  Attending Physician Name and Address:  Charolette Forward, MD  Relative Name and Phone Number:       Current Level of Care: Hospital Recommended Level of Care: [place] Prior Approval Number:    Date Approved/Denied:   PASRR Number: [id number]  Discharge Plan: SNF    Current Diagnoses: Patient Active Problem List   Diagnosis Date Noted  . Acute pulmonary edema ([place]) 11/14/2016  . Osteoarthritis of left hip 09/28/2016  . Metatarsal stress fracture with routine healing 08/04/2016  . Cervical spondylosis without myelopathy 07/31/2016  . Primary osteoarthritis of both first carpometacarpal joints 07/31/2016  . Acute on chronic combined systolic and diastolic heart failure ([place]) 07/22/2016  . Spinal stenosis in cervical region 05/29/2016  . Venous stasis dermatitis 11/13/2015  . Essential hypertension 10/30/2015  . Diverticulitis of colon 02/18/2015  . Chronic combined systolic and diastolic CHF (congestive heart failure) ([place])   . Chronic kidney disease, stage III (moderate)   . PVD (peripheral vascular disease) ([place])   . Right adrenal mass ([place]) 01/18/2015  . Chronic venous insufficiency 12/18/2014  . Advanced directives, counseling/discussion 04/02/2014  . Neuropathy due to peripheral vascular disease ([place]) 03/11/2014  . Chronic combined systolic and diastolic CHF, NYHA class 1 ([place]) 03/06/2014  . Routine general medical examination at a health care facility 03/02/2013  . Constipation due to pain medication 10/28/2011  . CAD (coronary artery disease), native coronary artery 06/05/2011  . COPD  exacerbation ([place]) 10/02/2010  . Dyslipidemia 10/30/2006  . Episodic mood disorder ([place]) 10/30/2006  . COPD (chronic obstructive pulmonary disease) ([place]) 10/30/2006  . GERD 10/30/2006  . Osteoarthritis 10/30/2006  . Osteoporosis 10/30/2006  . URINARY INCONTINENCE 10/30/2006    Orientation RESPIRATION BLADDER Height & Weight     Self, Time, Situation, Place  Normal Continent Weight: 182 lb 15.7 oz (83 kg) Height:  5\' 6"  (167.6 cm)  BEHAVIORAL SYMPTOMS/MOOD NEUROLOGICAL BOWEL NUTRITION STATUS      Continent Diet (See summary)  AMBULATORY STATUS COMMUNICATION OF NEEDS Skin   Extensive Assist Verbally Normal                       Personal Care Assistance Level of Assistance  Bathing, Feeding, Dressing Bathing Assistance: Limited assistance Feeding assistance: Limited assistance Dressing Assistance: Limited assistance     Functional Limitations Info  Sight, Hearing, Speech Sight Info: Adequate Hearing Info: Impaired Speech Info: Adequate    SPECIAL CARE FACTORS FREQUENCY  PT (By licensed PT), OT (By licensed OT)     PT Frequency: 5x OT Frequency: 5x            Contractures Contractures Info: Not present    Additional Factors Info  Code Status, Allergies Code Status Info: Full Code Allergies Info: Amoxicillin-pot Clavulanate, Penicillins, Cephalexin, Clarithromycin, Lidocaine, Nifedipine, Nsaids, Olmesartan Medoxomil, Tramadol Hcl           Current Medications (11/19/2016):  This is the current hospital active medication list Current Facility-Administered Medications  Medication Dose Route Frequency Provider Last Rate Last Dose  . 0.9 %  sodium chloride infusion   Intravenous Continuous Tegeler, Gwenyth Allegra, MD      .  0.9 %  sodium chloride infusion  250 mL Intravenous PRN Charolette Forward, MD 10 mL/hr at 11/14/16 1900 250 mL at 11/14/16 1900  . acetaminophen (TYLENOL) tablet 650 mg  650 mg Oral Q4H PRN Charolette Forward, MD   650 mg at 11/15/16 2112  .  albuterol (PROVENTIL) (2.5 MG/3ML) 0.083% nebulizer solution 2.5 mg  2.5 mg Nebulization Q4H PRN Charolette Forward, MD      . aspirin EC tablet 81 mg  81 mg Oral Daily Charolette Forward, MD   81 mg at 11/19/16 0856  . atorvastatin (LIPITOR) tablet 40 mg  40 mg Oral q1800 Charolette Forward, MD   40 mg at 11/18/16 1841  . bisacodyl (DULCOLAX) EC tablet 10 mg  10 mg Oral Daily PRN Charolette Forward, MD   10 mg at 11/17/16 2111  . escitalopram (LEXAPRO) tablet 10 mg  10 mg Oral Daily Charolette Forward, MD   10 mg at 11/19/16 0856  . famotidine (PEPCID) tablet 20 mg  20 mg Oral BID Charolette Forward, MD   20 mg at 11/19/16 0856  . ferrous sulfate tablet 325 mg  325 mg Oral TID WC Charolette Forward, MD   325 mg at 11/19/16 1142  . furosemide (LASIX) tablet 40 mg  40 mg Oral Daily Charolette Forward, MD   40 mg at 11/19/16 0856  . levalbuterol (XOPENEX) nebulizer solution 1.25 mg  1.25 mg Nebulization TID Charolette Forward, MD   1.25 mg at 11/19/16 1339  . losartan (COZAAR) tablet 50 mg  50 mg Oral Daily Charolette Forward, MD   50 mg at 11/19/16 0856  . metoprolol tartrate (LOPRESSOR) tablet 12.5 mg  12.5 mg Oral BID Charolette Forward, MD   12.5 mg at 11/19/16 0856  . nitroGLYCERIN (NITROSTAT) SL tablet 0.4 mg  0.4 mg Sublingual Q5 Min x 3 PRN Charolette Forward, MD      . ondansetron (ZOFRAN) injection 4 mg  4 mg Intravenous Q6H PRN Charolette Forward, MD      . sodium chloride flush (NS) 0.9 % injection 3 mL  3 mL Intravenous Q12H Charolette Forward, MD   3 mL at 11/19/16 0857  . sodium chloride flush (NS) 0.9 % injection 3 mL  3 mL Intravenous PRN Charolette Forward, MD         Discharge Medications: Please see discharge summary for a list of discharge medications.  Relevant Imaging Results:  Relevant Lab Results:   Additional Information SSN: [id number]    Wende Neighbors, LCSW

## 2016-11-19 NOTE — Progress Notes (Signed)
Attempted report to [facility]. No response.

## 2016-11-19 NOTE — Discharge Summary (Signed)
Haley Harris, Haley Harris NO.:  [id number]  MEDICAL RECORD NO.:  [id number]  LOCATION:  [facility]                         FACILITY:  [facility]  PHYSICIAN:  Allegra Lai. Terrence Dupont, M.D. DATE OF BIRTH:  1928/10/09  DATE OF ADMISSION:  11/14/2016 DATE OF DISCHARGE:  11/19/2016                              DISCHARGE SUMMARY   ADMITTING DIAGNOSES: 1. Acute coronary syndrome/acute pulmonary edema, rule out myocardial     infarction. 2. Coronary artery disease, history of myocardial infarction x2 in the     past, status post percutaneous coronary intervention to left     anterior descending and right coronary artery in the past. 3. Hypertension. 4. Hyperlipidemia. 5. Ischemic cardiomyopathy. 6. Chronic obstructive pulmonary disease. 7. History of spinal stenosis. 8. History of compression fracture, T12. 9. History of cerebrovascular disease. 10.History of carcinoma of breast. 11.Vitamin D deficiency. 12.Osteoporosis. 13.Chronic kidney disease stage 3 in the past.  DISCHARGE DIAGNOSES: 1. Compensated systolic heart failure. 2. Coronary artery disease, history of myocardial infarction x2 in the     past, status post percutaneous coronary intervention to left     anterior descending and right coronary artery in the past. 3. Hypertension. 4. Hyperlipidemia. 5. Ischemic cardiomyopathy. 6. Chronic obstructive pulmonary disease. 7. History of spinal stenosis. 8. History of compression fracture, T12. 9. History of cerebrovascular disease. 10.History of carcinoma of breast. 11.Vitamin D deficiency. 12.Osteoporosis. 13.Chronic kidney disease stage 3 in the past, improved. 14.Chronic anemia, stable.  DISCHARGE HOME MEDICATIONS: 1. Atorvastatin 40 mg one tablet daily. 2. Dulcolax 2 tablets daily as needed for moderate constipation. 3. Ferrous sulfate 325 mg three times a day with meals. 4. Nitrostat sublingual p.r.n. 5. Tylenol 2 tablets every 4 hours as needed. 6. Proventil  inhaler by nebulization every 6 hours as needed. 7. Albuterol 2 puffs every 6 hours as needed. 8. Aspirin 81 mg one tablet daily. 9. Caltrate 600 mg with vitamin D 1 tablet daily. 10.Prolia 60 mg every 6 months. 11.Lexapro 10 mg daily. 12.Pepcid 20 mg twice daily. 13.Allegra 180 mg at bedtime. 14.Flonase two sprays in each nostril daily. 15.__________ mg every evening. 16.Losartan 50 mg daily. 17.Singulair 10 mg daily. 18.Multivitamin with mineral 1 tablet daily. 19.Aldactone 25 mg daily. 20.Vitamin B12 500 mcg two tablets 3 times per week as before. 21.Carvedilol 6.25 mg twice daily. 22.Lasix 40 mg twice daily. 23.Gabapentin 100 mg twice daily.  DIET:  Low salt, low cholesterol.  The patient has been advised to restrict fluid to 1 L per 24 hours and monitor weights daily.  Heart failure instructions have been given.  The patient will be discharged to skilled nursing facility.  CONDITION AT DISCHARGE:  Stable.  BRIEF HISTORY AND HOSPITAL COURSE:  Haley Harris is an 83 year old female with past medical history significant for coronary artery disease; history of non-Q-wave myocardial infarction x2 in the past, status post PCI to distal RCA in 2012 and then PTCA stenting to proximal LAD in August 2015; ischemic cardiomyopathy, EF approximately 40-45% in the past; hypertension; COPD; history of cerebrovascular disease; degenerative joint disease; peripheral vascular disease; hyperlipidemia; GERD; depression; history of carcinoma of the breast in the past; vitamin D deficiency.  She  came to the ER by EMS because of progressive worsening shortness of breath for last 2 days and then developed chest pain in the ED, localized, grade 5/10.  EKG done in the ED showed sinus tachycardia with nonspecific intraventricular conduction delay, left axis deviation, and diffuse ST elevation in inferior and anterolateral leads with poor R-wave progression and was noted to have minimally elevated  troponin I.  Code STEMI was called by EDP.  The patient when seen in the EDP was on BiPAP complaining of left-sided chest pain. Denies any relation of chest pain to breathing.  Denies any cough or fever.  The patient was initially noted to be hypoxic, requiring breathing treatment and was placed on BiPAP, received 40 mg IV Lasix and breathing treatment and Solu-Medrol in the ED.  PHYSICAL EXAMINATION:  GENERAL:  On examination, she was alert, awake, oriented. VITAL SIGNS:  Blood pressure was 137/97, pulse 116, and she was afebrile. EYES:  Conjunctivae were pink. NECK:  Supple.  Positive JVD. HEART:  She was tachycardic.  S1, S2. ABDOMEN:  Soft. LUNGS:  Decreased breath sounds anterolaterally with faint rales. EXTREMITIES:  There is no clubbing, cyanosis, or edema.  LABORATORY DATA:  Sodium was 130, potassium 4.3, BUN 14, and creatinine 1.23.  Troponin I was 0.17.  Hemoglobin was 12.8, hematocrit 38.6, and white count of 12.2.  Troponin I repeat was 0.40, 0.61, and 0.65, which were minimally elevated.  Her last hemoglobin was 11.3, hematocrit 35.0, and white count of 7.2.  TSH was 1.24.  Chest x-ray showed cardiac enlargement and pulmonary edema, suspicious for CHF.  BRIEF HOSPITAL COURSE:  The patient was emergently taken to the cardiac catheterization lab and underwent left cardiac cath with selective left and right coronary angiography and was noted to have patent stents.  The patient did not have any further episodes of chest pain during the hospital stay.  Her troponin is minimally elevated.  The patient received IV Lasix during hospital stay with good diuresis.  OT/PT consultation was obtained and recommendation was skilled nursing facility rehab discussed with the patient and family regarding skilled nursing facility to which they agreed.  The patient will be discharged to skilled nursing facility at [facility] in [place], [place] [place].     Allegra Lai. Terrence Dupont,  M.D.     MNH/MEDQ  D:  11/19/2016  T:  11/19/2016  Job:  [id number]

## 2016-11-19 NOTE — Progress Notes (Signed)
Pt has been discharged to [facility]. IV and telemetry box removed. Pt transported by [facility]. Pt left with all of her belongings.    Grant Fontana BSN, RN

## 2016-11-19 NOTE — Care Management Note (Signed)
Case Management Note  Patient Details  Name: CYAN CLIPPINGER MRN: [id number] Date of Birth: 1929-05-03  Subjective/Objective:                    Action/Plan:  DC to SNF as facilitated by CSW today.   Expected Discharge Date:  11/19/16               Expected Discharge Plan:  Skilled Nursing Facility  In-House Referral:  Clinical Social Work  Discharge planning Services  CM Consult  Post Acute Care Choice:  NA Choice offered to:  NA  DME Arranged:  N/A DME Agency:  NA  HH Arranged:  NA HH Agency:  NA (had [facility] int the past)  Status of Service:  Completed, signed off  If discussed at [employer], dates discussed:    Additional Comments:  Carles Collet, RN 11/19/2016, 9:53 AM

## 2016-11-20 ENCOUNTER — Non-Acute Institutional Stay (SKILLED_NURSING_FACILITY): Payer: Medicare Other | Admitting: Family

## 2016-11-20 ENCOUNTER — Encounter: Payer: Self-pay | Admitting: Family

## 2016-11-20 DIAGNOSIS — G8929 Other chronic pain: Secondary | ICD-10-CM

## 2016-11-20 DIAGNOSIS — G4700 Insomnia, unspecified: Secondary | ICD-10-CM

## 2016-11-20 DIAGNOSIS — M549 Dorsalgia, unspecified: Secondary | ICD-10-CM

## 2016-11-20 DIAGNOSIS — M542 Cervicalgia: Secondary | ICD-10-CM | POA: Diagnosis not present

## 2016-11-22 NOTE — Progress Notes (Signed)
Location:  [facility] Room Number: 1610R Place of Service:  SNF [phone]) Provider: Dinah Ngetich FNP-C  Venia Carbon, MD  Patient Care Team: Venia Carbon, MD as PCP - General  Extended Emergency Contact Information Primary Emergency Contact: Covington,[name] Address: [address]          [place],  [address] [place]nette Litter of [place] Phone: [phone] Mobile Phone: [phone] Relation: Daughter Secondary Emergency Contact: Dickey Gave Address: [place] Phone: [phone] Mobile Phone: [phone] Relation: Daughter  Code Status: Full Code  Goals of care: Advanced Directive information Advanced Directives 11/20/2016  Does Patient Have a Medical Advance Directive? No  Type of Advance Directive -  Does patient want to make changes to medical advance directive? -  Copy of [place] in Chart? -  Would patient like information on creating a medical advance directive? -     Chief Complaint  Patient presents with  . Acute Visit    Neck and back pain    HPI:  Pt is a 83 y.o. female seen today at [facility] for an acute visit for evaluation of pain and insomnia. She complains of chronic neck and back pain. She states was on percocet at the hospital but was discontinued by cardiology. She would like to take tylenol for pain. Also complains of inability to sleep at night. She denies any fever, chills or cough.     Past Medical History:  Diagnosis Date  . ACE-inhibitor cough   . Allergic rhinitis, cause unspecified   . Angina   . Anxiety   . CHF (congestive heart failure) ([place])   . Chronic airway obstruction, not elsewhere classified   . Compression fracture of T12 vertebra ([place]) 10/28/2011  . Depressive disorder, not elsewhere classified   . Esophageal reflux   . Heart murmur    aS CHILD  . Malignant neoplasm of breast (female),  unspecified site   . Myocardial infarct ([place])   . 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 Occlusion and stenosis of carotid artery without mention of cerebral infarction   . Osteoarthrosis, unspecified whether generalized or localized, unspecified site   . Osteoporosis, unspecified   . Other and unspecified hyperlipidemia   . Other dyspnea and respiratory abnormality   . Oxygen dependent   . Peripheral vascular disease, unspecified ([place])   . Personal history of malignant neoplasm of breast   . Pneumonia   . Recurrent upper respiratory infection (URI)   . Shortness of breath   . Spinal stenosis   . Unspecified cardiovascular disease   . Unspecified essential hypertension   . Unspecified hearing loss   . Unspecified urinary incontinence    Past Surgical History:  Procedure Laterality Date  . ABDOMINAL HYSTERECTOMY    . APPENDECTOMY  1948  . BREAST IMPLANT REMOVAL  06/12/09   right  . BREAST RECONSTRUCTION  1998   Reconstruction   . CARDIAC CATHETERIZATION N/A 01/25/2015   Procedure: Left Heart Cath and Coronary Angiography;  Surgeon: Charolette Forward, MD;  Location: [facility];  Service: Cardiovascular;  Laterality: N/A;  . CORONARY ANGIOPLASTY  11/12   distal RCA  . FLEXIBLE SIGMOIDOSCOPY N/A 01/21/2015   Procedure: FLEXIBLE SIGMOIDOSCOPY;  Surgeon: Richmond Campbell, MD;  Location: [facility] ENDOSCOPY;  Service: Endoscopy;  Laterality: N/A;  . FLEXIBLE SIGMOIDOSCOPY  01/22/2015      .  FRACTURE SURGERY     fracture right elbow  . LEFT HEART CATH AND CORONARY ANGIOGRAPHY N/A 11/14/2016   Procedure: Left Heart Cath and Coronary Angiography;  Surgeon: Charolette Forward, MD;  Location: [facility];  Service: Cardiovascular;  Laterality: N/A;  . LEFT HEART CATHETERIZATION WITH CORONARY ANGIOGRAM N/A 06/05/2011   Procedure: LEFT HEART CATHETERIZATION WITH CORONARY ANGIOGRAM;  Surgeon: Clent Demark, MD;  Location: [facility] CATH LAB;  Service: Cardiovascular;  Laterality:  N/A;  . LEFT HEART CATHETERIZATION WITH CORONARY ANGIOGRAM N/A 03/05/2014   Procedure: LEFT HEART CATHETERIZATION WITH CORONARY ANGIOGRAM;  Surgeon: Clent Demark, MD;  Location: [facility] CATH LAB;  Service: Cardiovascular;  Laterality: N/A;  . LUMBAR LAMINECTOMY  2003  . MASTECTOMY  1987   Right  . MASTECTOMY    . MASTOIDECTOMY  childhood  . REDUCTION MAMMAPLASTY  1998   Left  . SHOULDER SURGERY  02/2005   Bilateral fractures with multiple surgeries    Allergies  Allergen Reactions  . Amoxicillin-Pot Clavulanate Anaphylaxis  . Penicillins Anaphylaxis  . Cephalexin Other (See Comments)    Reaction unknown  . Clarithromycin Other (See Comments)    Reaction unknown  . Lidocaine     Patient had tremors following use of lidocaine pain patches  . Nifedipine Other (See Comments)    Reaction unknown  . Nsaids Other (See Comments)    Heart issue  . Olmesartan Medoxomil     REACTION: cough;  But tolerating Losartan 02/2014  . Tramadol Hcl Other (See Comments)    loopy    Allergies as of 11/20/2016      Reactions   Amoxicillin-pot Clavulanate Anaphylaxis   Penicillins Anaphylaxis   Cephalexin Other (See Comments)   Reaction unknown   Clarithromycin Other (See Comments)   Reaction unknown   Lidocaine    Patient had tremors following use of lidocaine pain patches   Nifedipine Other (See Comments)   Reaction unknown   Nsaids Other (See Comments)   Heart issue   Olmesartan Medoxomil    REACTION: cough;  But tolerating Losartan 02/2014   Tramadol Hcl Other (See Comments)   loopy      Medication List       Accurate as of 11/20/16 11:59 PM. Always use your most recent med list.          acetaminophen 325 MG tablet Commonly known as:  TYLENOL Take 2 tablets (650 mg total) by mouth every 4 (four) hours as needed for headache or mild pain.   albuterol (2.5 MG/3ML) 0.083% nebulizer solution Commonly known as:  PROVENTIL Take 3 mLs (2.5 mg total) by nebulization every 6 (six)  hours as needed for wheezing or shortness of breath.   PROAIR HFA 108 (90 Base) MCG/ACT inhaler Generic drug:  albuterol USE 2 PUFFS EVERY 6 HOURS AS NEEDED FOR WHEEZING OR SHORTNESS OF BREATH   aspirin 81 MG tablet Take 81 mg by mouth daily after lunch.   atorvastatin 40 MG tablet Commonly known as:  LIPITOR Take 1 tablet (40 mg total) by mouth daily at 6 PM.   bisacodyl 5 MG EC tablet Commonly known as:  DULCOLAX Take 2 tablets (10 mg total) by mouth daily as needed for moderate constipation.   CALTRATE 600+D PO Take 1 tablet by mouth daily after lunch.   carvedilol 6.25 MG tablet Commonly known as:  COREG Take 1 tablet (6.25 mg total) by mouth 2 (two) times daily with a meal. Take 1 tablet by mouth two  times daily with a meal   denosumab 60 MG/ML Soln injection Commonly known as:  PROLIA Inject 60 mg into the skin every 6 (six) months. Reported on 11/05/2015   escitalopram 10 MG tablet Commonly known as:  LEXAPRO Take 1 tablet (10 mg total) by mouth daily.   famotidine 20 MG tablet Commonly known as:  PEPCID Take 1 tablet (20 mg total) by mouth 2 (two) times daily.   ferrous sulfate 325 (65 FE) MG tablet Take 1 tablet (325 mg total) by mouth 3 (three) times daily with meals.   fexofenadine 180 MG tablet Commonly known as:  ALLEGRA Take 180 mg by mouth at bedtime.   fluticasone 50 MCG/ACT nasal spray Commonly known as:  FLONASE Use 2 sprays in each  nostril daily   furosemide 40 MG tablet Commonly known as:  LASIX Take 1 tablet (40 mg total) by mouth 2 (two) times daily. Take two tabs BID with weight gain   gabapentin 100 MG capsule Commonly known as:  NEURONTIN TAKE 1 CAPSULE BY MOUTH TWO TIMES DAILY   levocetirizine 5 MG tablet Commonly known as:  XYZAL Take 5 mg by mouth every evening.   losartan 50 MG tablet Commonly known as:  COZAAR Take 1 tablet by mouth  daily   montelukast 10 MG tablet Commonly known as:  SINGULAIR TAKE 1 TABLET BY MOUTH  DAILY   multivitamin with minerals Tabs tablet Take 1 tablet by mouth daily after lunch.   nitroGLYCERIN 0.4 MG SL tablet Commonly known as:  NITROSTAT Place 1 tablet (0.4 mg total) under the tongue every 5 (five) minutes x 3 doses as needed for chest pain.   spironolactone 25 MG tablet Commonly known as:  ALDACTONE TAKE 1 TABLET BY MOUTH  DAILY   vitamin B-12 500 MCG tablet Commonly known as:  CYANOCOBALAMIN Take 1,000 mcg by mouth 3 (three) times a week. Mon, Wed, Fri       Review of Systems  Constitutional: Negative for activity change, appetite change, chills, fatigue and fever.  HENT: Negative for congestion, rhinorrhea, sinus pain, sinus pressure, sneezing and sore throat.   Eyes: Negative.   Respiratory: Negative for cough, chest tightness, shortness of breath and wheezing.   Cardiovascular: Negative for chest pain, palpitations and leg swelling.  Gastrointestinal: Negative for abdominal distention, abdominal pain, constipation, diarrhea, nausea and vomiting.  Genitourinary: Negative for dysuria, flank pain, frequency and urgency.  Musculoskeletal: Positive for back pain, gait problem and neck pain.  Skin: Negative for color change, pallor and rash.  Psychiatric/Behavioral: Positive for sleep disturbance. Negative for agitation, confusion and hallucinations.    Immunization History  Administered Date(s) Administered  . Influenza Split 05/13/2011, 05/02/2012  . Influenza Whole 04/30/2004, 05/27/2007, 04/16/2008, 04/16/2009, 03/14/2010  . Influenza,inj,Quad PF,36+ Mos 05/04/2013, 04/02/2014, 04/12/2015, 05/07/2016  . PPD Test 11/20/2016  . Pneumococcal Conjugate-13 04/02/2014  . Pneumococcal Polysaccharide-23 03/02/2013  . Td 03/02/2013  . Tdap 10/26/2015  . Zoster 07/20/2011   Pertinent  Health Maintenance Due  Topic Date Due  . DEXA SCAN  03/20/1996  . INFLUENZA VACCINE  02/10/2017  . PNA vac Low Risk Adult  Completed   Fall Risk  10/26/2016 09/28/2016  09/07/2016 07/31/2016 06/11/2016  Falls in the past year? Yes Yes Yes No Yes  Number falls in past yr: 2 or more 2 or more 2 or more - -  Injury with Fall? No - Yes - -  Risk Factor Category  - High Fall Risk High Fall Risk - -  Risk for fall due to : History of fall(s);Impaired balance/gait Impaired balance/gait - - -  Follow up Falls prevention discussed Falls prevention discussed Education provided - -   Functional Status Survey:    Vitals:   11/22/16 2053  BP: (!) 112/57  Pulse: 71  Resp: 18  Temp: 97.2 F (36.2 C)  SpO2: 95%  Weight: 178 lb (80.7 kg)  Height: 5\' 6"  (1.676 m)   Body mass index is 28.73 kg/m. Physical Exam  Constitutional: She is oriented to person, place, and time. She appears well-developed and well-nourished. No distress.  HENT:  Head: Normocephalic.  Mouth/Throat: Oropharynx is clear and moist. No oropharyngeal exudate.  Eyes: Conjunctivae and EOM are normal. Pupils are equal, round, and reactive to light. Right eye exhibits no discharge. Left eye exhibits no discharge. No scleral icterus.  Neck: Normal range of motion. No JVD present. No thyromegaly present.  Cardiovascular: Normal rate, regular rhythm, normal heart sounds and intact distal pulses.  Exam reveals no gallop and no friction rub.   No murmur heard. Pulmonary/Chest: Effort normal and breath sounds normal. No respiratory distress. She has no wheezes. She has no rales.  Abdominal: Soft. Bowel sounds are normal. She exhibits no distension. There is no tenderness. There is no rebound and no guarding.  Musculoskeletal: She exhibits no edema, tenderness or deformity.  Unsteady gait.Moves x 4 extremities.   Lymphadenopathy:    She has no cervical adenopathy.  Neurological: She is oriented to person, place, and time.  Skin: Skin is warm and dry. No rash noted. No erythema. No pallor.  Psychiatric: She has a normal mood and affect.    Labs reviewed:  Recent Labs  11/29/15 1238 03/03/16 1254   11/14/16 1310 11/14/16 1422 11/14/16 1555 11/17/16 0242  NA 134* 129*  < > 130* 129* 132* 129*  K 4.5 4.5  < > 4.3 4.5 4.4 4.0  CL 101 91*  < > 96* 97* 95* 94*  CO2 25 31  < > 19*  --  25 27  GLUCOSE 103* 129*  < > 260* 219* 162* 104*  BUN 24* 27*  < > 14 20 17  32*  CREATININE 1.19 1.22*  < > 1.23* 0.90 1.18* 1.34*  CALCIUM 8.6 8.7  < > 8.6*  --  9.0 8.0*  MG  --   --   --   --   --  2.6*  --   PHOS 3.5 4.7*  --   --   --   --   --   < > = values in this interval not displayed.  Recent Labs  03/03/16 1254 09/07/16 1143 11/14/16 1310  AST  --  16 30  ALT  --  10 11*  ALKPHOS  --  52 51  BILITOT  --  0.6 0.9  PROT  --  6.9 6.7  ALBUMIN 4.2 4.0 3.8    Recent Labs  09/07/16 1143 11/14/16 1310  11/14/16 1555  11/17/16 0242 11/18/16 0358 11/19/16 0159  WBC 6.5 12.4*  --  15.7*  < > 6.7 8.1 7.2  NEUTROABS 4.4 9.4*  --  14.6*  --   --   --   --   HGB 12.2 12.8  < > 13.1  < > 9.9* 10.3* 11.3*  HCT 36.9 38.6  < > 39.6  < > 30.4* 31.6* 35.0*  MCV 98.7 97.5  --  97.1  < > 97.7 97.2 98.0  PLT 226.0 214  --  180  < >  131* 137* 138*  < > = values in this interval not displayed. Lab Results  Component Value Date   TSH 1.249 11/14/2016   Lab Results  Component Value Date   HGBA1C 6.0 (H) 09/27/2015   Lab Results  Component Value Date   CHOL 167 11/14/2016   HDL 73 11/14/2016   LDLCALC 63 11/14/2016   LDLDIRECT 121.4 04/16/2009   TRIG 156 (H) 11/14/2016   CHOLHDL 2.3 11/14/2016    Significant Diagnostic Results in last 30 days:  Dg Chest Portable 1 View  Result Date: 11/14/2016 CLINICAL DATA:  Shortness of breath and hypoxia. EXAM: PORTABLE CHEST 1 VIEW COMPARISON:  10/29/2015 FINDINGS: Cardiac enlargement noted. Aortic atherosclerosis. Moderate diffuse pulmonary edema is identified bilaterally. IMPRESSION: 1. Cardiac enlargement and pulmonary edema suspicious for CHF. 2.  Aortic Atherosclerosis (ICD10-I70.0). Electronically Signed   By: Kerby Moors M.D.   On:  11/14/2016 13:33    Assessment/Plan 1. Chronic neck pain She is off percocet but request tylenol. Will start Extra strength Tylenol 1000 mg Tablet every 8 hours. Continue to monitor.   2. Chronic bilateral back pain, unspecified back location Start Tylenol as above.   3. Insomnia  Will start melatonin 3 mg tablet at bedtime.   Family/ staff Communication: Reviewed plan of care with patient and facility Nurse supervisor  Labs/tests ordered: None   Sandrea Hughs, NP

## 2016-11-23 ENCOUNTER — Non-Acute Institutional Stay (SKILLED_NURSING_FACILITY): Payer: Medicare Other | Admitting: Internal Medicine

## 2016-11-23 ENCOUNTER — Encounter: Payer: Self-pay | Admitting: Internal Medicine

## 2016-11-23 DIAGNOSIS — I5042 Chronic combined systolic (congestive) and diastolic (congestive) heart failure: Secondary | ICD-10-CM

## 2016-11-23 DIAGNOSIS — M810 Age-related osteoporosis without current pathological fracture: Secondary | ICD-10-CM

## 2016-11-23 DIAGNOSIS — I739 Peripheral vascular disease, unspecified: Secondary | ICD-10-CM

## 2016-11-23 DIAGNOSIS — Z9181 History of falling: Secondary | ICD-10-CM | POA: Diagnosis not present

## 2016-11-23 DIAGNOSIS — I25111 Atherosclerotic heart disease of native coronary artery with angina pectoris with documented spasm: Secondary | ICD-10-CM | POA: Diagnosis not present

## 2016-11-23 DIAGNOSIS — E785 Hyperlipidemia, unspecified: Secondary | ICD-10-CM

## 2016-11-23 DIAGNOSIS — M542 Cervicalgia: Secondary | ICD-10-CM

## 2016-11-23 DIAGNOSIS — K5909 Other constipation: Secondary | ICD-10-CM

## 2016-11-23 DIAGNOSIS — G63 Polyneuropathy in diseases classified elsewhere: Secondary | ICD-10-CM

## 2016-11-23 DIAGNOSIS — F32A Depression, unspecified: Secondary | ICD-10-CM

## 2016-11-23 DIAGNOSIS — E871 Hypo-osmolality and hyponatremia: Secondary | ICD-10-CM

## 2016-11-23 DIAGNOSIS — N183 Chronic kidney disease, stage 3 unspecified: Secondary | ICD-10-CM

## 2016-11-23 DIAGNOSIS — D638 Anemia in other chronic diseases classified elsewhere: Secondary | ICD-10-CM

## 2016-11-23 DIAGNOSIS — F329 Major depressive disorder, single episode, unspecified: Secondary | ICD-10-CM

## 2016-11-23 DIAGNOSIS — J309 Allergic rhinitis, unspecified: Secondary | ICD-10-CM

## 2016-11-23 DIAGNOSIS — R531 Weakness: Secondary | ICD-10-CM

## 2016-11-23 DIAGNOSIS — D696 Thrombocytopenia, unspecified: Secondary | ICD-10-CM

## 2016-11-23 DIAGNOSIS — K219 Gastro-esophageal reflux disease without esophagitis: Secondary | ICD-10-CM

## 2016-11-23 DIAGNOSIS — M170 Bilateral primary osteoarthritis of knee: Secondary | ICD-10-CM

## 2016-11-23 NOTE — Progress Notes (Signed)
LOCATION: [facility]   PCP: Venia Carbon, MD   Code Status: Full Code  Goals of care: Advanced Directive information Advanced Directives 11/20/2016  Does Patient Have a Medical Advance Directive? No  Type of Advance Directive -  Does patient want to make changes to medical advance directive? -  Copy of [place] in Chart? -  Would patient like information on creating a medical advance directive? -       Extended Emergency Contact Information Primary Emergency Contact: Covington,[name] Address: [address]          [place], [place] [address] John[place] Litter of [place] Phone: [phone] Mobile Phone: [phone] Relation: Daughter Secondary Emergency Contact: Dickey Gave Address: [place] Phone: [phone] Mobile Phone: [phone] Relation: Daughter   Allergies  Allergen Reactions  . Amoxicillin-Pot Clavulanate Anaphylaxis  . Penicillins Anaphylaxis  . Cephalexin Other (See Comments)    Reaction unknown  . Clarithromycin Other (See Comments)    Reaction unknown  . Lidocaine     Patient had tremors following use of lidocaine pain patches  . Nifedipine Other (See Comments)    Reaction unknown  . Nsaids Other (See Comments)    Heart issue  . Olmesartan Medoxomil     REACTION: cough;  But tolerating Losartan 02/2014  . Tramadol Hcl Other (See Comments)    loopy    Chief Complaint  Patient presents with  . New Admit To SNF    New Admission Visit      HPI:  Patient is a 83 y.o. female seen today for short term rehabilitation post hospital admission from 11/14/16-11/19/16 with worsening dyspnea. There were concerns for ACS and code STEMI was called in the ED. She received iv solumedrol and BiPAP in ED. She then underwent cardiac catheterization with coronary angiogram showing patent stent. She received iv lasix for pulmonary edema. She is here with her deconditioning. She has medical  history of CAD, systolic CHF, HTN, HLD, spinal stenosis, CVA, ckd 3, osteoporosis among others. She is seen in her room today.   Review of Systems:  Constitutional: Negative for fever, chills, diaphoresis. Energy level is low.  HENT: Negative for headache, congestion, nasal discharge, sore throat. Positive for occasional difficulty swallowing, has the sensation of food getting stuck.   Eyes: Negative for eye pain, blurred vision, double vision and discharge. Wears glasses. Respiratory: Negative for wheezing. Positive for dry cough and shortness of breath with minimal exertion.   Cardiovascular: Negative for chest pain, palpitations, leg swelling.  Gastrointestinal: Negative for heartburn, nausea, vomiting, abdominal pain, loss of appetite, diarrhea and constipation. Last bowel movement was this morning. Black colored stool. Denies seeing frank blood.  Genitourinary: Negative for dysuria and flank pain.  Musculoskeletal: Negative for fall in the facility. Positive for chronic neck pain, knee pain and lower back pain. Skin: Negative for itching, rash.  Neurological: Positive for occasional dizziness with change of position. Psychiatric/Behavioral: Negative for depression   Past Medical History:  Diagnosis Date  . ACE-inhibitor cough   . Allergic rhinitis, cause unspecified   . Angina   . Anxiety   . CHF (congestive heart failure) ([place])   . Chronic airway obstruction, not elsewhere classified   . Compression fracture of T12 vertebra ([facility]) 10/28/2011  . Depressive disorder, not elsewhere classified   . Esophageal reflux   . Heart murmur    aS CHILD  . Malignant neoplasm  of breast (female), unspecified site   . Myocardial infarct ([facility] [place])   . 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 Occlusion and stenosis of carotid artery without mention of cerebral infarction   . Osteoarthrosis, unspecified whether generalized or localized, unspecified site   . Osteoporosis, unspecified    . Other and unspecified hyperlipidemia   . Other dyspnea and respiratory abnormality   . Oxygen dependent   . Peripheral vascular disease, unspecified ([facility])   . Personal history of malignant neoplasm of breast   . Pneumonia   . Recurrent upper respiratory infection (URI)   . Shortness of breath   . Spinal stenosis   . Unspecified cardiovascular disease   . Unspecified essential hypertension   . Unspecified hearing loss   . Unspecified urinary incontinence    Past Surgical History:  Procedure Laterality Date  . ABDOMINAL HYSTERECTOMY    . APPENDECTOMY  1948  . BREAST IMPLANT REMOVAL  06/12/09   right  . BREAST RECONSTRUCTION  1998   Reconstruction   . CARDIAC CATHETERIZATION N/A 01/25/2015   Procedure: Left Heart Cath and Coronary Angiography;  Surgeon: Charolette Forward, MD;  Location: [facility];  Service: Cardiovascular;  Laterality: N/A;  . CORONARY ANGIOPLASTY  11/12   distal RCA  . FLEXIBLE SIGMOIDOSCOPY N/A 01/21/2015   Procedure: FLEXIBLE SIGMOIDOSCOPY;  Surgeon: Richmond Campbell, MD;  Location: [facility] ENDOSCOPY;  Service: Endoscopy;  Laterality: N/A;  . FLEXIBLE SIGMOIDOSCOPY  01/22/2015      . FRACTURE SURGERY     fracture right elbow  . LEFT HEART CATH AND CORONARY ANGIOGRAPHY N/A 11/14/2016   Procedure: Left Heart Cath and Coronary Angiography;  Surgeon: Charolette Forward, MD;  Location: [facility];  Service: Cardiovascular;  Laterality: N/A;  . LEFT HEART CATHETERIZATION WITH CORONARY ANGIOGRAM N/A 06/05/2011   Procedure: LEFT HEART CATHETERIZATION WITH CORONARY ANGIOGRAM;  Surgeon: Clent Demark, MD;  Location: [facility];  Service: Cardiovascular;  Laterality: N/A;  . LEFT HEART CATHETERIZATION WITH CORONARY ANGIOGRAM N/A 03/05/2014   Procedure: LEFT HEART CATHETERIZATION WITH CORONARY ANGIOGRAM;  Surgeon: Clent Demark, MD;  Location: [facility];  Service: Cardiovascular;  Laterality: N/A;  . LUMBAR LAMINECTOMY  2003  . MASTECTOMY  1987   Right  .  MASTECTOMY    . MASTOIDECTOMY  childhood  . REDUCTION MAMMAPLASTY  1998   Left  . SHOULDER SURGERY  02/2005   Bilateral fractures with multiple surgeries   Social History:   reports that she quit smoking about 42 years ago. She has never used smokeless tobacco. She reports that she drinks alcohol. She reports that she does not use drugs.  Family History  Problem Relation Age of Onset  . Heart attack Father 26  . Cancer Mother        ovarian  . Cancer Brother        lung  . Arthritis Unknown        family    Medications: Allergies as of 11/23/2016      Reactions   Amoxicillin-pot Clavulanate Anaphylaxis   Penicillins Anaphylaxis   Cephalexin Other (See Comments)   Reaction unknown   Clarithromycin Other (See Comments)   Reaction unknown   Lidocaine    Patient had tremors following use of lidocaine pain patches   Nifedipine Other (See Comments)   Reaction unknown   Nsaids Other (See Comments)   Heart issue   Olmesartan Medoxomil    REACTION: cough;  But tolerating Losartan  02/2014   Tramadol Hcl Other (See Comments)   loopy      Medication List       Accurate as of 11/23/16 11:45 AM. Always use your most recent med list.          acetaminophen 500 MG tablet Commonly known as:  TYLENOL Take 1,000 mg by mouth every 8 (eight) hours.   albuterol (2.5 MG/3ML) 0.083% nebulizer solution Commonly known as:  PROVENTIL Take 3 mLs (2.5 mg total) by nebulization every 6 (six) hours as needed for wheezing or shortness of breath.   PROAIR HFA 108 (90 Base) MCG/ACT inhaler Generic drug:  albuterol USE 2 PUFFS EVERY 6 HOURS AS NEEDED FOR WHEEZING OR SHORTNESS OF BREATH   aspirin 81 MG tablet Take 81 mg by mouth daily after lunch.   atorvastatin 40 MG tablet Commonly known as:  LIPITOR Take 1 tablet (40 mg total) by mouth daily at 6 PM.   bisacodyl 5 MG EC tablet Commonly known as:  DULCOLAX Take 2 tablets (10 mg total) by mouth daily as needed for moderate  constipation.   CALTRATE 600+D PO Take 1 tablet by mouth daily after lunch.   carvedilol 6.25 MG tablet Commonly known as:  COREG Take 1 tablet (6.25 mg total) by mouth 2 (two) times daily with a meal. Take 1 tablet by mouth two  times daily with a meal   denosumab 60 MG/ML Soln injection Commonly known as:  PROLIA Inject 60 mg into the skin every 6 (six) months. Reported on 11/05/2015   escitalopram 10 MG tablet Commonly known as:  LEXAPRO Take 1 tablet (10 mg total) by mouth daily.   famotidine 20 MG tablet Commonly known as:  PEPCID Take 1 tablet (20 mg total) by mouth 2 (two) times daily.   ferrous sulfate 325 (65 FE) MG tablet Take 1 tablet (325 mg total) by mouth 3 (three) times daily with meals.   fexofenadine 180 MG tablet Commonly known as:  ALLEGRA Take 180 mg by mouth at bedtime.   fluticasone 50 MCG/ACT nasal spray Commonly known as:  FLONASE Use 2 sprays in each  nostril daily   furosemide 40 MG tablet Commonly known as:  LASIX Take 1 tablet (40 mg total) by mouth 2 (two) times daily. Take two tabs BID with weight gain   gabapentin 100 MG capsule Commonly known as:  NEURONTIN TAKE 1 CAPSULE BY MOUTH TWO TIMES DAILY   levocetirizine 5 MG tablet Commonly known as:  XYZAL Take 5 mg by mouth every evening.   losartan 50 MG tablet Commonly known as:  COZAAR Take 1 tablet by mouth  daily   Melatonin 3 MG Tabs Take 1 tablet by mouth at bedtime.   montelukast 10 MG tablet Commonly known as:  SINGULAIR TAKE 1 TABLET BY MOUTH DAILY   multivitamin with minerals Tabs tablet Take 1 tablet by mouth daily after lunch.   nitroGLYCERIN 0.4 MG SL tablet Commonly known as:  NITROSTAT Place 1 tablet (0.4 mg total) under the tongue every 5 (five) minutes x 3 doses as needed for chest pain.   spironolactone 25 MG tablet Commonly known as:  ALDACTONE TAKE 1 TABLET BY MOUTH  DAILY   vitamin B-12 500 MCG tablet Commonly known as:  CYANOCOBALAMIN Take 1,000 mcg  by mouth 3 (three) times a week. Mon, Wed, Fri       Immunizations: Immunization History  Administered Date(s) Administered  . Influenza Split 05/13/2011, 05/02/2012  . Influenza Whole 04/30/2004, 05/27/2007,  04/16/2008, 04/16/2009, 03/14/2010  . Influenza,inj,Quad PF,36+ Mos 05/04/2013, 04/02/2014, 04/12/2015, 05/07/2016  . PPD Test 11/20/2016  . Pneumococcal Conjugate-13 04/02/2014  . Pneumococcal Polysaccharide-23 03/02/2013  . Td 03/02/2013  . Tdap 10/26/2015  . Zoster 07/20/2011     Physical Exam: Vitals:   11/23/16 1137  BP: 120/70  Pulse: 70  Resp: 18  Temp: 98 F (36.7 C)  TempSrc: Oral  SpO2: 96%  Weight: 178 lb (80.7 kg)  Height: 5\' 6"  (1.676 m)   Body mass index is 28.73 kg/m.  General- elderly female, well built, in no acute distress Head- normocephalic, atraumatic Nose- no maxillary or frontal sinus tenderness, no nasal discharge Throat- moist mucus membrane, normal oropharynx Eyes- PERRLA, EOMI, no pallor, no icterus, no discharge, normal conjunctiva, normal sclera Neck- no cervical lymphadenopathy Cardiovascular- normal s1,s2, no murmur Respiratory- bilateral clear to auscultation, no wheeze, no rhonchi, no crackles, no use of accessory muscles Abdomen- bowel sounds present, soft, non tender, no guarding or rigidity Musculoskeletal- able to move all 4 extremities, generalized weakness, has kyphosis and scoliosis, trace leg edema, cervical spine tenderness on exam Neurological- alert and oriented to person, place and time Skin- warm and dry, easy bruising Psychiatry- normal mood and affect    Labs reviewed: Basic Metabolic Panel:  Recent Labs  11/29/15 1238 03/03/16 1254  11/14/16 1310 11/14/16 1422 11/14/16 1555 11/17/16 0242  NA 134* 129*  < > 130* 129* 132* 129*  K 4.5 4.5  < > 4.3 4.5 4.4 4.0  CL 101 91*  < > 96* 97* 95* 94*  CO2 25 31  < > 19*  --  25 27  GLUCOSE 103* 129*  < > 260* 219* 162* 104*  BUN 24* 27*  < > 14 20 17  32*    CREATININE 1.19 1.22*  < > 1.23* 0.90 1.18* 1.34*  CALCIUM 8.6 8.7  < > 8.6*  --  9.0 8.0*  MG  --   --   --   --   --  2.6*  --   PHOS 3.5 4.7*  --   --   --   --   --   < > = values in this interval not displayed. Liver Function Tests:  Recent Labs  03/03/16 1254 09/07/16 1143 11/14/16 1310  AST  --  16 30  ALT  --  10 11*  ALKPHOS  --  52 51  BILITOT  --  0.6 0.9  PROT  --  6.9 6.7  ALBUMIN 4.2 4.0 3.8    Recent Labs  11/14/16 1310  LIPASE 33   No results for input(s): AMMONIA in the last 8760 hours. CBC:  Recent Labs  09/07/16 1143 11/14/16 1310  11/14/16 1555  11/17/16 0242 11/18/16 0358 11/19/16 0159  WBC 6.5 12.4*  --  15.7*  < > 6.7 8.1 7.2  NEUTROABS 4.4 9.4*  --  14.6*  --   --   --   --   HGB 12.2 12.8  < > 13.1  < > 9.9* 10.3* 11.3*  HCT 36.9 38.6  < > 39.6  < > 30.4* 31.6* 35.0*  MCV 98.7 97.5  --  97.1  < > 97.7 97.2 98.0  PLT 226.0 214  --  180  < > 131* 137* 138*  < > = values in this interval not displayed. Cardiac Enzymes:  Recent Labs  11/14/16 1555 11/14/16 2121 11/15/16 0355  TROPONINI 0.40* 0.61* 0.65*   BNP: Invalid input(s): POCBNP CBG: No results  for input(s): GLUCAP in the last 8760 hours.  Radiological Exams: Dg Chest Portable 1 View  Result Date: 11/14/2016 CLINICAL DATA:  Shortness of breath and hypoxia. EXAM: PORTABLE CHEST 1 VIEW COMPARISON:  10/29/2015 FINDINGS: Cardiac enlargement noted. Aortic atherosclerosis. Moderate diffuse pulmonary edema is identified bilaterally. IMPRESSION: 1. Cardiac enlargement and pulmonary edema suspicious for CHF. 2.  Aortic Atherosclerosis (ICD10-I70.0). Electronically Signed   By: Kerby Moors M.D.   On: 11/14/2016 13:33    Assessment/Plan  Generalized weakness With deconditioning. Will have her work with physical therapy and occupational therapy team to help with gait training and muscle strengthening exercises.fall precautions. Skin care. Encourage to be out of bed.   Chronic  combined diastolic and systolic chf Had acute pulmonary edema and required iv lasix in hospital. Breathing appears stable this visit. Has trace leg edema but no other signs of fluid overload. Continue lasix 40 mg bid, carvedilol 6.25 mg bid, losartan 50 mg daily, aldactone 25 mg daily. Check weight 3 days a week. Check bmp. Make f/u with cardiology.   CAD s/p cardiac catheterization and angiography, patent stent. Continue b blocker, ARB and aspirin with statin. Chest pain free. f/u with cardiology. Continue prn NTG.   Hyponatremia With her on lasix, check bmp  ckd stage 3 Monitor bmp  Anemia of chronic disease Check cbc. Continue iron supplement.   Thrombocytopenia No bleed reported, with her on aspirin, check platelet count periodically.  Cervicalgia Continue tylenol 1000 mg tid. PMR consult.   Knee OA Continue diclofenac get and monitor  Allergic rhinitis Continue her levocetirizine, flonase, singulair and allegra  Chronic constipation On prn dulcolax tab  gerd Continue pepcid  PVD with neuropathy Continue gabapentin 100 mg bid and monitor. Continue tylenol 1000 mg tid. PMR consult.   Chronic depression Continue home regimen lexapro  Osteoporosis Fall precautions, continue prolia and ca-vit d supplement   Goals of care: short term rehabilitation   Labs/tests ordered: cbc, bmp 11/24/16  Family/ staff Communication: reviewed care plan with patient and nursing supervisor    Blanchie Serve, MD Internal Medicine [place], [place] [address] Cell Phone (Monday-Friday 8 am - 5 pm): [phone] On Call: [phone] and follow prompts after 5 pm and on weekends Office Phone: [phone] Office Fax: [phone]

## 2016-11-24 LAB — CBC AND DIFFERENTIAL
HEMATOCRIT: 32 % — AB (ref 36–46)
Hemoglobin: 10.5 g/dL — AB (ref 12.0–16.0)
PLATELETS: 181 10*3/uL (ref 150–399)
WBC: 5.5 10*3/mL

## 2016-11-24 LAB — BASIC METABOLIC PANEL
BUN: 44 mg/dL — AB (ref 4–21)
CREATININE: 1.5 mg/dL — AB (ref 0.5–1.1)
Glucose: 85 mg/dL
Potassium: 4.1 mmol/L (ref 3.4–5.3)
Sodium: 141 mmol/L (ref 137–147)

## 2016-11-25 DIAGNOSIS — M542 Cervicalgia: Secondary | ICD-10-CM | POA: Diagnosis not present

## 2016-11-25 DIAGNOSIS — Z9181 History of falling: Secondary | ICD-10-CM | POA: Diagnosis not present

## 2016-11-27 ENCOUNTER — Encounter: Payer: Medicare Other | Admitting: Registered Nurse

## 2016-12-04 ENCOUNTER — Encounter: Payer: Self-pay | Admitting: Family

## 2016-12-04 ENCOUNTER — Non-Acute Institutional Stay (SKILLED_NURSING_FACILITY): Payer: Medicare Other | Admitting: Family

## 2016-12-04 DIAGNOSIS — J449 Chronic obstructive pulmonary disease, unspecified: Secondary | ICD-10-CM | POA: Diagnosis not present

## 2016-12-04 DIAGNOSIS — G4700 Insomnia, unspecified: Secondary | ICD-10-CM | POA: Diagnosis not present

## 2016-12-04 DIAGNOSIS — R2681 Unsteadiness on feet: Secondary | ICD-10-CM | POA: Diagnosis not present

## 2016-12-04 DIAGNOSIS — I2510 Atherosclerotic heart disease of native coronary artery without angina pectoris: Secondary | ICD-10-CM

## 2016-12-04 DIAGNOSIS — K219 Gastro-esophageal reflux disease without esophagitis: Secondary | ICD-10-CM

## 2016-12-04 DIAGNOSIS — G63 Polyneuropathy in diseases classified elsewhere: Secondary | ICD-10-CM

## 2016-12-04 DIAGNOSIS — I739 Peripheral vascular disease, unspecified: Secondary | ICD-10-CM

## 2016-12-04 DIAGNOSIS — I5042 Chronic combined systolic (congestive) and diastolic (congestive) heart failure: Secondary | ICD-10-CM | POA: Diagnosis not present

## 2016-12-04 DIAGNOSIS — I5040 Unspecified combined systolic (congestive) and diastolic (congestive) heart failure: Secondary | ICD-10-CM

## 2016-12-04 DIAGNOSIS — I110 Hypertensive heart disease with heart failure: Secondary | ICD-10-CM

## 2016-12-04 DIAGNOSIS — E785 Hyperlipidemia, unspecified: Secondary | ICD-10-CM

## 2016-12-06 DIAGNOSIS — I110 Hypertensive heart disease with heart failure: Secondary | ICD-10-CM | POA: Insufficient documentation

## 2016-12-06 DIAGNOSIS — I5040 Unspecified combined systolic (congestive) and diastolic (congestive) heart failure: Secondary | ICD-10-CM

## 2016-12-06 NOTE — Progress Notes (Signed)
Location:  [facility] Room Number: 8527P Place of Service:  SNF [phone])  Provider: Marlowe Sax FNP-C   PCP: Venia Carbon, MD Patient Care Team: Venia Carbon, MD as PCP - General  Extended Emergency Contact Information Primary Emergency Contact: Covington,[name] Address: [address]          [place], [place] [address] Johnnette Litter of [place] Phone: [phone] Work Phone: [phone] Mobile Phone: [phone] Relation: Daughter Secondary Emergency Contact: Dickey Gave Address: [place] of [place] Phone: [phone] Mobile Phone: [phone] Relation: Daughter  Code Status: Full code  Goals of care:  Advanced Directive information Advanced Directives 12/04/2016  Does Patient Have a Medical Advance Directive? No  Type of Advance Directive -  Does patient want to make changes to medical advance directive? -  Copy of [place] in Chart? -  Would patient like information on creating a medical advance directive? -     Allergies  Allergen Reactions  . Amoxicillin-Pot Clavulanate Anaphylaxis  . Penicillins Anaphylaxis  . Cephalexin Other (See Comments)    Reaction unknown  . Clarithromycin Other (See Comments)    Reaction unknown  . Lidocaine     Patient had tremors following use of lidocaine pain patches  . Nifedipine Other (See Comments)    Reaction unknown  . Nsaids Other (See Comments)    Heart issue  . Olmesartan Medoxomil     REACTION: cough;  But tolerating Losartan 02/2014  . Tramadol Hcl Other (See Comments)    loopy    Chief Complaint  Patient presents with  . Discharge Note    Discharge from [facility]    HPI:  83 y.o. female seen today at [facility] for discharge home.she was here for short term rehabilitation for post hospital admission from 11/14/2016-11/19/2016 with worsening dyspnea. There were concerns for ACS and code  STEMI was called in the ED. She received iv solumedrol and BiPAP in ED. She then underwent cardiac catheterization with coronary angiogram showing patent stent. She received I.V lasix for pulmonary edema.She was discharged to rehab for her deconditioning. She has a medical history of HTN, Hyperlipidemia,CHF,COPD, CAD, PVD GERD among other conditions. She is seen in her room today. She denies any new acute issues this visit. She states current pain regimen effective for her neck and back pain.she has had unremarkable stay here in rehab.she has follow up appointment with Cardiologist Dr. Samuella Bruin for CAD and CHF.   She has worked well with PT/OT now stable for discharge home.She will be discharged home with Home health PT/OT to continue with ROM, Exercise, Gait stability and muscle strengthening. She does not require any DME has own Rollator, wheelchair and shower chair at home. Home health services will be arranged by facility social worker prior to discharge. Prescription medication will be written x 1 month then patient to follow up with PCP in 1-2 weeks.Facility staff report no new concerns.  Past Medical History:  Diagnosis Date  . ACE-inhibitor cough   . Allergic rhinitis, cause unspecified   . Angina   . Anxiety   . CHF (congestive heart failure) ([place])   . Chronic airway obstruction, not elsewhere classified   . Compression fracture of T12 vertebra ([place]) 10/28/2011  . Depressive disorder, not elsewhere classified   . Esophageal reflux   . Heart murmur    aS CHILD  . Malignant neoplasm  of breast (female), unspecified site   . Myocardial infarct ([place])   . 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 Occlusion and stenosis of carotid artery without mention of cerebral infarction   . Osteoarthrosis, unspecified whether generalized or localized, unspecified site   . Osteoporosis, unspecified   . Other and unspecified hyperlipidemia   . Other dyspnea and respiratory abnormality     . Oxygen dependent   . Peripheral vascular disease, unspecified ([place])   . Personal history of malignant neoplasm of breast   . Pneumonia   . Recurrent upper respiratory infection (URI)   . Shortness of breath   . Spinal stenosis   . Unspecified cardiovascular disease   . Unspecified essential hypertension   . Unspecified hearing loss   . Unspecified urinary incontinence     Past Surgical History:  Procedure Laterality Date  . ABDOMINAL HYSTERECTOMY    . APPENDECTOMY  1948  . BREAST IMPLANT REMOVAL  06/12/09   right  . BREAST RECONSTRUCTION  1998   Reconstruction   . CARDIAC CATHETERIZATION N/A 01/25/2015   Procedure: Left Heart Cath and Coronary Angiography;  Surgeon: Charolette Forward, MD;  Location: [facility] CV LAB;  Service: Cardiovascular;  Laterality: N/A;  . CORONARY ANGIOPLASTY  11/12   distal RCA  . FLEXIBLE SIGMOIDOSCOPY N/A 01/21/2015   Procedure: FLEXIBLE SIGMOIDOSCOPY;  Surgeon: Richmond Campbell, MD;  Location: [facility] ENDOSCOPY;  Service: Endoscopy;  Laterality: N/A;  . FLEXIBLE SIGMOIDOSCOPY  01/22/2015      . FRACTURE SURGERY     fracture right elbow  . LEFT HEART CATH AND CORONARY ANGIOGRAPHY N/A 11/14/2016   Procedure: Left Heart Cath and Coronary Angiography;  Surgeon: Charolette Forward, MD;  Location: [facility] CV LAB;  Service: Cardiovascular;  Laterality: N/A;  . LEFT HEART CATHETERIZATION WITH CORONARY ANGIOGRAM N/A 06/05/2011   Procedure: LEFT HEART CATHETERIZATION WITH CORONARY ANGIOGRAM;  Surgeon: Clent Demark, MD;  Location: [facility] CATH LAB;  Service: Cardiovascular;  Laterality: N/A;  . LEFT HEART CATHETERIZATION WITH CORONARY ANGIOGRAM N/A 03/05/2014   Procedure: LEFT HEART CATHETERIZATION WITH CORONARY ANGIOGRAM;  Surgeon: Clent Demark, MD;  Location: [facility] CATH LAB;  Service: Cardiovascular;  Laterality: N/A;  . LUMBAR LAMINECTOMY  2003  . MASTECTOMY  1987   Right  . MASTECTOMY    . MASTOIDECTOMY  childhood  . REDUCTION MAMMAPLASTY  1998   Left  . SHOULDER  SURGERY  02/2005   Bilateral fractures with multiple surgeries      reports that she quit smoking about 42 years ago. She has never used smokeless tobacco. She reports that she drinks alcohol. She reports that she does not use drugs. Social History   Social History  . Marital status: Widowed    Spouse name: N/A  . Number of children: 2  . Years of education: N/A   Occupational History  . 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  [employer]   Social History Main Topics  . Smoking status: Former Smoker    Quit date: 07/13/1974  . Smokeless tobacco: Never Used  . Alcohol use 0.0 oz/week     Comment: occasional  . Drug use: No  . Sexual activity: No   Other Topics Concern  . Not on file   Social History Narrative   Has living will   Son or daughter would be health care POA.   Requests DNR--written 03/02/13   No tube feeds if cognitively unaware  Allergies  Allergen Reactions  . Amoxicillin-Pot Clavulanate Anaphylaxis  . Penicillins Anaphylaxis  . Cephalexin Other (See Comments)    Reaction unknown  . Clarithromycin Other (See Comments)    Reaction unknown  . Lidocaine     Patient had tremors following use of lidocaine pain patches  . Nifedipine Other (See Comments)    Reaction unknown  . Nsaids Other (See Comments)    Heart issue  . Olmesartan Medoxomil     REACTION: cough;  But tolerating Losartan 02/2014  . Tramadol Hcl Other (See Comments)    loopy    Pertinent  Health Maintenance Due  Topic Date Due  . DEXA SCAN  03/20/1996  . INFLUENZA VACCINE  02/10/2017  . PNA vac Low Risk Adult  Completed    Medications: Allergies as of 12/04/2016      Reactions   Amoxicillin-pot Clavulanate Anaphylaxis   Penicillins Anaphylaxis   Cephalexin Other (See Comments)   Reaction unknown   Clarithromycin Other (See Comments)   Reaction unknown   Lidocaine    Patient had tremors following use of lidocaine pain patches   Nifedipine Other (See Comments)    Reaction unknown   Nsaids Other (See Comments)   Heart issue   Olmesartan Medoxomil    REACTION: cough;  But tolerating Losartan 02/2014   Tramadol Hcl Other (See Comments)   loopy      Medication List       Accurate as of 12/04/16 11:59 PM. Always use your most recent med list.          acetaminophen 500 MG tablet Commonly known as:  TYLENOL Take 1,000 mg by mouth every 8 (eight) hours.   albuterol (2.5 MG/3ML) 0.083% nebulizer solution Commonly known as:  PROVENTIL Take 3 mLs (2.5 mg total) by nebulization every 6 (six) hours as needed for wheezing or shortness of breath.   PROAIR HFA 108 (90 Base) MCG/ACT inhaler Generic drug:  albuterol USE 2 PUFFS EVERY 6 HOURS AS NEEDED FOR WHEEZING OR SHORTNESS OF BREATH   aspirin 81 MG tablet Take 81 mg by mouth daily after lunch.   atorvastatin 40 MG tablet Commonly known as:  LIPITOR Take 1 tablet (40 mg total) by mouth daily at 6 PM.   bisacodyl 5 MG EC tablet Commonly known as:  DULCOLAX Take 2 tablets (10 mg total) by mouth daily as needed for moderate constipation.   CALTRATE 600+D PO Take 1 tablet by mouth daily after lunch.   carvedilol 6.25 MG tablet Commonly known as:  COREG Take 1 tablet (6.25 mg total) by mouth 2 (two) times daily with a meal. Take 1 tablet by mouth two  times daily with a meal   denosumab 60 MG/ML Soln injection Commonly known as:  PROLIA Inject 60 mg into the skin every 6 (six) months. Reported on 11/05/2015   diclofenac sodium 1 % Gel Commonly known as:  VOLTAREN Apply 2 g topically 2 (two) times daily. Apply to neck and to each shoulder   escitalopram 10 MG tablet Commonly known as:  LEXAPRO Take 1 tablet (10 mg total) by mouth daily.   famotidine 20 MG tablet Commonly known as:  PEPCID Take 1 tablet (20 mg total) by mouth 2 (two) times daily.   ferrous sulfate 325 (65 FE) MG tablet Take 1 tablet (325 mg total) by mouth 3 (three) times daily with meals.   fexofenadine 180 MG  tablet Commonly known as:  ALLEGRA Take 180 mg by mouth at bedtime.  fluticasone 50 MCG/ACT nasal spray Commonly known as:  FLONASE Use 2 sprays in each  nostril daily   furosemide 40 MG tablet Commonly known as:  LASIX Take 1 tablet (40 mg total) by mouth 2 (two) times daily. Take two tabs BID with weight gain   gabapentin 100 MG capsule Commonly known as:  NEURONTIN TAKE 1 CAPSULE BY MOUTH TWO TIMES DAILY   levocetirizine 5 MG tablet Commonly known as:  XYZAL Take 5 mg by mouth every evening.   losartan 50 MG tablet Commonly known as:  COZAAR Take 1 tablet by mouth  daily   Melatonin 3 MG Tabs Take 1 tablet by mouth at bedtime.   montelukast 10 MG tablet Commonly known as:  SINGULAIR TAKE 1 TABLET BY MOUTH DAILY   multivitamin with minerals Tabs tablet Take 1 tablet by mouth daily after lunch.   nitroGLYCERIN 0.4 MG SL tablet Commonly known as:  NITROSTAT Place 1 tablet (0.4 mg total) under the tongue every 5 (five) minutes x 3 doses as needed for chest pain.   spironolactone 25 MG tablet Commonly known as:  ALDACTONE TAKE 1 TABLET BY MOUTH  DAILY   vitamin B-12 500 MCG tablet Commonly known as:  CYANOCOBALAMIN Take 1,000 mcg by mouth 3 (three) times a week. Mon, Wed, Fri       Review of Systems  Constitutional: Negative for activity change, appetite change, chills, fatigue and fever.  HENT: Negative for congestion, rhinorrhea, sinus pain, sinus pressure, sneezing and sore throat.   Eyes: Negative.   Respiratory: Negative for cough, chest tightness, shortness of breath and wheezing.   Cardiovascular: Negative for chest pain, palpitations and leg swelling.  Gastrointestinal: Negative for abdominal distention, abdominal pain, constipation, diarrhea, nausea and vomiting.  Endocrine: Negative.   Genitourinary: Negative for dysuria, flank pain, frequency and urgency.  Musculoskeletal: Positive for back pain, gait problem and neck pain.  Skin: Negative for  color change, pallor and rash.  Neurological: Negative for dizziness, seizures, syncope, light-headedness, numbness and headaches.  Hematological: Does not bruise/bleed easily.  Psychiatric/Behavioral: Negative for agitation, confusion, hallucinations and sleep disturbance. The patient is not nervous/anxious.     Vitals:   12/04/16 1135  BP: (!) 129/57  Pulse: 65  Resp: 20  Temp: (!) 96.7 F (35.9 C)  SpO2: 95%  Weight: 179 lb 3.2 oz (81.3 kg)  Height: 5\' 6"  (1.676 m)   Body mass index is 28.92 kg/m. Physical Exam  Constitutional: She is oriented to person, place, and time. She appears well-developed and well-nourished. No distress.  HENT:  Head: Normocephalic.  Mouth/Throat: Oropharynx is clear and moist. No oropharyngeal exudate.  Eyes: Conjunctivae and EOM are normal. Pupils are equal, round, and reactive to light. Right eye exhibits no discharge. Left eye exhibits no discharge. No scleral icterus.  Neck: Normal range of motion. No JVD present. No thyromegaly present.  Cardiovascular: Normal rate, regular rhythm, normal heart sounds and intact distal pulses.  Exam reveals no gallop and no friction rub.   No murmur heard. Pulmonary/Chest: Effort normal and breath sounds normal. No respiratory distress. She has no wheezes. She has no rales.  Abdominal: Soft. Bowel sounds are normal. She exhibits no distension. There is no tenderness. There is no rebound and no guarding.  Musculoskeletal: She exhibits no edema, tenderness or deformity.  Unsteady gait  Lymphadenopathy:    She has no cervical adenopathy.  Neurological: She is oriented to person, place, and time.  Skin: Skin is warm and dry. No rash  noted. No erythema. No pallor.  Psychiatric: She has a normal mood and affect.    Labs reviewed: Basic Metabolic Panel:  Recent Labs  03/03/16 1254  11/14/16 1310 11/14/16 1422 11/14/16 1555 11/17/16 0242 11/24/16  NA 129*  < > 130* 129* 132* 129* 141  K 4.5  < > 4.3 4.5 4.4  4.0 4.1  CL 91*  < > 96* 97* 95* 94*  --   CO2 31  < > 19*  --  25 27  --   GLUCOSE 129*  < > 260* 219* 162* 104*  --   BUN 27*  < > 14 20 17  32* 44*  CREATININE 1.22*  < > 1.23* 0.90 1.18* 1.34* 1.5*  CALCIUM 8.7  < > 8.6*  --  9.0 8.0*  --   MG  --   --   --   --  2.6*  --   --   PHOS 4.7*  --   --   --   --   --   --   < > = values in this interval not displayed. Liver Function Tests:  Recent Labs  03/03/16 1254 09/07/16 1143 11/14/16 1310  AST  --  16 30  ALT  --  10 11*  ALKPHOS  --  52 51  BILITOT  --  0.6 0.9  PROT  --  6.9 6.7  ALBUMIN 4.2 4.0 3.8    Recent Labs  11/14/16 1310  LIPASE 33   CBC:  Recent Labs  09/07/16 1143 11/14/16 1310  11/14/16 1555  11/17/16 0242 11/18/16 0358 11/19/16 0159 11/24/16  WBC 6.5 12.4*  --  15.7*  < > 6.7 8.1 7.2 5.5  NEUTROABS 4.4 9.4*  --  14.6*  --   --   --   --   --   HGB 12.2 12.8  < > 13.1  < > 9.9* 10.3* 11.3* 10.5*  HCT 36.9 38.6  < > 39.6  < > 30.4* 31.6* 35.0* 32*  MCV 98.7 97.5  --  97.1  < > 97.7 97.2 98.0  --   PLT 226.0 214  --  180  < > 131* 137* 138* 181  < > = values in this interval not displayed. Cardiac Enzymes:  Recent Labs  11/14/16 1555 11/14/16 2121 11/15/16 0355  TROPONINI 0.40* 0.61* 0.65*    Assessment/Plan:   1. Unsteady gait   Has worked well with PT/ OT.Will discharge home PT/OT to continue with ROM, Exercise, Gait stability and muscle strengthening. She does not require any DME has own Rollator, wheelchair and shower chair at home. Fall and safety precautions.  2. Benign hypertensive heart disease with CHF and with combined systolic and diastolic dysfunction B/p stable.continue on Spironolactone 25 mg Tablet, Losartan 50 mg Tablet,carvedilol 6.25 mg Tablet and Furosemide 40 mg tablet. On ASA for chest pain prophylaxis. BMP in 1-2 weeks with PCP.     3. Chronic combined systolic and diastolic CHF (congestive heart failure)  Weight stable.Exam findings negative for cough, shortness of  breath, wheezing or rales. Continue on fluid restrictions 1.5 L/day and NAS diet. Continue to monitor weight. Follow up with Cardiologist as directed.   4. Coronary artery disease  Chest pain free. Continue on Spironolactone 25 mg Tablet, Losartan 50 mg Tablet,carvedilol 6.25 mg Tablet, Lipitor 40 mg tablet and ASA. Follow up with Cardiology as directed.   5. Neuropathy due to peripheral vascular disease Continue on Gabapentin.   6. Chronic obstructive pulmonary disease  Breathing is stable. Continue on Albuterol as needed.On montelukast. CBC/diff in 1-2 weeks with PCP.   7. Gastroesophageal reflux disease without esophagitis Stable.Continue on famotidine.   8. Dyslipidemia Continue on Lipitor 40 mg Tablet. Monitor lipid panel periodically.   9. Insomnia Melatonin effective.   Patient is being discharged with the following home health services:   -PT/OT for ROM, exercise, gait stability and muscle strengthening  Patient is being discharged with the following durable medical equipment:   - None required has own Rollator, WC and shower chair at home.   Patient has been advised to f/u with their PCP in 1-2 weeks to for a transitions of care visit.Social services at their facility was responsible for arranging this appointment.  Pt was provided with adequate prescriptions of noncontrolled medications to reach the scheduled appointment.For controlled substances, a limited supply was provided as appropriate for the individual patient. If the pt normally receives these medications from a pain clinic or has a contract with another physician, these medications should be received from that clinic or physician only).    Future labs/tests needed:  CBC/dff, BMP in 1-2 weeks PCP

## 2016-12-09 ENCOUNTER — Telehealth: Payer: Self-pay | Admitting: Internal Medicine

## 2016-12-09 DIAGNOSIS — H26491 Other secondary cataract, right eye: Secondary | ICD-10-CM | POA: Diagnosis not present

## 2016-12-09 NOTE — Telephone Encounter (Signed)
Kecia from [facility] called to let you know pt is scheduled for PT in home services tomorrow.

## 2016-12-10 ENCOUNTER — Telehealth: Payer: Self-pay

## 2016-12-10 DIAGNOSIS — E785 Hyperlipidemia, unspecified: Secondary | ICD-10-CM

## 2016-12-10 DIAGNOSIS — R2681 Unsteadiness on feet: Secondary | ICD-10-CM | POA: Diagnosis not present

## 2016-12-10 DIAGNOSIS — I110 Hypertensive heart disease with heart failure: Secondary | ICD-10-CM | POA: Diagnosis not present

## 2016-12-10 DIAGNOSIS — I2510 Atherosclerotic heart disease of native coronary artery without angina pectoris: Secondary | ICD-10-CM | POA: Diagnosis not present

## 2016-12-10 DIAGNOSIS — J449 Chronic obstructive pulmonary disease, unspecified: Secondary | ICD-10-CM | POA: Diagnosis not present

## 2016-12-10 DIAGNOSIS — I739 Peripheral vascular disease, unspecified: Secondary | ICD-10-CM | POA: Diagnosis not present

## 2016-12-10 DIAGNOSIS — Z853 Personal history of malignant neoplasm of breast: Secondary | ICD-10-CM | POA: Diagnosis not present

## 2016-12-10 DIAGNOSIS — G629 Polyneuropathy, unspecified: Secondary | ICD-10-CM | POA: Diagnosis not present

## 2016-12-10 DIAGNOSIS — I5042 Chronic combined systolic (congestive) and diastolic (congestive) heart failure: Secondary | ICD-10-CM | POA: Diagnosis not present

## 2016-12-10 DIAGNOSIS — F419 Anxiety disorder, unspecified: Secondary | ICD-10-CM

## 2016-12-10 DIAGNOSIS — Z9011 Acquired absence of right breast and nipple: Secondary | ICD-10-CM | POA: Diagnosis not present

## 2016-12-10 DIAGNOSIS — F329 Major depressive disorder, single episode, unspecified: Secondary | ICD-10-CM | POA: Diagnosis not present

## 2016-12-10 DIAGNOSIS — M1991 Primary osteoarthritis, unspecified site: Secondary | ICD-10-CM | POA: Diagnosis not present

## 2016-12-10 NOTE — Telephone Encounter (Signed)
That is fine 

## 2016-12-10 NOTE — Telephone Encounter (Signed)
okay

## 2016-12-10 NOTE — Telephone Encounter (Signed)
Verbal orders given to Kelly 

## 2016-12-10 NOTE — Telephone Encounter (Signed)
Spoke to [place]. Gave verbal order.

## 2016-12-10 NOTE — Telephone Encounter (Signed)
Scarlette nurse with [facility] left v/m requesting verbal orders to add [facility] aide 2 x a week for 6 weeks.

## 2016-12-10 NOTE — Telephone Encounter (Signed)
Claiborne Billings PT with [facility] left v/m requesting verbal orders for [facility] PT 3 x a week for 3 weeks and 2 x a week for 3 weeks.

## 2016-12-10 NOTE — Telephone Encounter (Signed)
Okay 

## 2016-12-11 DIAGNOSIS — I2510 Atherosclerotic heart disease of native coronary artery without angina pectoris: Secondary | ICD-10-CM | POA: Diagnosis not present

## 2016-12-11 DIAGNOSIS — I119 Hypertensive heart disease without heart failure: Secondary | ICD-10-CM | POA: Diagnosis not present

## 2016-12-11 DIAGNOSIS — E785 Hyperlipidemia, unspecified: Secondary | ICD-10-CM | POA: Diagnosis not present

## 2016-12-11 DIAGNOSIS — I739 Peripheral vascular disease, unspecified: Secondary | ICD-10-CM | POA: Diagnosis not present

## 2016-12-11 DIAGNOSIS — D649 Anemia, unspecified: Secondary | ICD-10-CM | POA: Diagnosis not present

## 2016-12-15 ENCOUNTER — Encounter: Payer: Self-pay | Admitting: Family Medicine

## 2016-12-15 ENCOUNTER — Ambulatory Visit (INDEPENDENT_AMBULATORY_CARE_PROVIDER_SITE_OTHER): Payer: Medicare Other | Admitting: Family Medicine

## 2016-12-15 ENCOUNTER — Other Ambulatory Visit: Payer: Self-pay | Admitting: Family Medicine

## 2016-12-15 ENCOUNTER — Telehealth: Payer: Self-pay | Admitting: Family Medicine

## 2016-12-15 VITALS — BP 116/70 | HR 75 | Temp 98.3°F | Wt 177.0 lb

## 2016-12-15 DIAGNOSIS — M79645 Pain in left finger(s): Secondary | ICD-10-CM | POA: Diagnosis not present

## 2016-12-15 LAB — URIC ACID: URIC ACID, SERUM: 10.7 mg/dL — AB (ref 2.4–7.0)

## 2016-12-15 MED ORDER — DOXYCYCLINE HYCLATE 100 MG PO TABS
100.0000 mg | ORAL_TABLET | Freq: Two times a day (BID) | ORAL | 0 refills | Status: DC
Start: 2016-12-15 — End: 2016-12-23

## 2016-12-15 NOTE — Telephone Encounter (Signed)
Called and spoke with patient regarding colchicine. When trying to prescribe this there is a warning regarding giving this in combination with Lipitor. On review of up-to-date it appears that there is a contraindication to placing someone on colchicine with kidney dysfunction who is also on a glycoprotein inhibitor which would be Lipitor. I do not feel that prednisone is a great option particularly given her recent issues with congestive heart failure. Discussed that I'm going to send a message to a pharmacist to get their input and we'll likely place her on treatment tomorrow.

## 2016-12-15 NOTE — Assessment & Plan Note (Signed)
Patient with onset of pain and erythema in her left index finger starting yesterday. Erythema has progressively spread closer to her fingernail. Discussed given the spreading redness the concern would be for cellulitis. Also discussed possibility of gout though she has no history. Her daughter actually brought up the possibility of gout and cellulitis as well. We'll check a uric acid. We will place on doxycycline to cover for cellulitis. We will recheck on Thursday. She is given return precautions.

## 2016-12-15 NOTE — Progress Notes (Signed)
  Tommi Rumps, MD Phone: [phone]  Haley Harris is a 83 y.o. female who presents today for same-day visit.  Patient notes left index finger pain and erythema starting yesterday near the dorsal DIP joint, the erythema has spread up towards her base of her fingernail. She has been trying ice and topical treatments with no benefit. She has had no fevers though the area is warm and painful and does itch. She has no injury. No hangnail. No insect bite.  PMH: No history of gout though does note a family history of gout.   ROS see history of present illness  Objective  Physical Exam Vitals:   12/15/16 1033  BP: 116/70  Pulse: 75  Temp: 98.3 F (36.8 C)    BP Readings from Last 3 Encounters:  12/15/16 116/70  12/04/16 (!) 129/57  11/23/16 120/70   Wt Readings from Last 3 Encounters:  12/15/16 177 lb (80.3 kg)  12/04/16 179 lb 3.2 oz (81.3 kg)  11/23/16 178 lb (80.7 kg)    Physical Exam  Constitutional: No distress.  Cardiovascular: Normal rate, regular rhythm and normal heart sounds.   Pulmonary/Chest: Effort normal and breath sounds normal.  Neurological: She is alert.  Skin: Skin is warm and dry. She is not diaphoretic.  Left index finger over the dorsal ulnar aspect with erythema near the DIP joint, erythema has spread up towards the base of the fingernail, there is no fluctuance, there is warmth and tenderness, good capillary refill in the finger     Assessment/Plan: Please see individual problem list.  Pain of finger of left hand Patient with onset of pain and erythema in her left index finger starting yesterday. Erythema has progressively spread closer to her fingernail. Discussed given the spreading redness the concern would be for cellulitis. Also discussed possibility of gout though she has no history. Her daughter actually brought up the possibility of gout and cellulitis as well. We'll check a uric acid. We will place on doxycycline to cover for cellulitis.  We will recheck on Thursday. She is given return precautions.   Orders Placed This Encounter  Procedures  . Uric acid    Meds ordered this encounter  Medications  . doxycycline (VIBRA-TABS) 100 MG tablet    Sig: Take 1 tablet (100 mg total) by mouth 2 (two) times daily.    Dispense:  10 tablet    Refill:  0   Tommi Rumps, MD [place]

## 2016-12-15 NOTE — Patient Instructions (Signed)
Nice to see you. We'll check a uric acid today to evaluate for gout though I think this is less likely. The concern is for skin infection given the spreading redness. We will cover you with doxycycline. You should take a probiotic or eat yogurt while on this to reduce risk of diarrhea. We'll recheck you in 2 days to ensure that it is improving. If you develop worsening pain, spreading redness, fevers, or new or change in symptoms please seek medical attention.

## 2016-12-16 DIAGNOSIS — R2681 Unsteadiness on feet: Secondary | ICD-10-CM | POA: Diagnosis not present

## 2016-12-16 DIAGNOSIS — I5042 Chronic combined systolic (congestive) and diastolic (congestive) heart failure: Secondary | ICD-10-CM | POA: Diagnosis not present

## 2016-12-16 DIAGNOSIS — J449 Chronic obstructive pulmonary disease, unspecified: Secondary | ICD-10-CM | POA: Diagnosis not present

## 2016-12-16 DIAGNOSIS — I2510 Atherosclerotic heart disease of native coronary artery without angina pectoris: Secondary | ICD-10-CM | POA: Diagnosis not present

## 2016-12-16 DIAGNOSIS — I110 Hypertensive heart disease with heart failure: Secondary | ICD-10-CM | POA: Diagnosis not present

## 2016-12-16 MED ORDER — PREDNISONE 10 MG PO TABS: ORAL_TABLET | ORAL | 0 refills | Status: DC

## 2016-12-16 NOTE — Addendum Note (Signed)
Addended by: Leone Haven on: 12/16/2016 04:58 PM   Modules accepted: Orders

## 2016-12-16 NOTE — Telephone Encounter (Signed)
Please contact patient to see how her symptoms are doing. Please see if her pain is worsened. Please check to see if her erythema has spread. Please let her know that I did speak with the pharmacist and we came up with the plan of doing a low dose of prednisone for several days to see if that would be beneficial and possibly using a topical anti-inflammatory such as diclofenac. If she is willing I can send in the prednisone for her to take and then follow-up with her on Friday for recheck. Thanks.Marland Kitchen

## 2016-12-16 NOTE — Telephone Encounter (Signed)
Prednisone was sent to the pharmacy. Previously discussed 20 mg daily for 3 days and 10 mg daily for 3 days following that to keep this at a low dose. I did discuss the contraindication with Lipitor and colchicine which the pharmacist confirmed. Also confirmed that NSAIDs would not be a great option given her kidney function and her recent cardiac issues. We decided on low-dose prednisone as the best option. We'll see how she does in follow-up.

## 2016-12-16 NOTE — Telephone Encounter (Signed)
Patient states it has not spread but it is more swollen. Notified patient we will send in rx, please sent rx to [facility]. Patient is scheduled for follow up Friday.

## 2016-12-16 NOTE — Telephone Encounter (Signed)
LMOM for patient to call back.

## 2016-12-17 ENCOUNTER — Ambulatory Visit: Payer: Medicare Other | Admitting: Family Medicine

## 2016-12-17 DIAGNOSIS — I2510 Atherosclerotic heart disease of native coronary artery without angina pectoris: Secondary | ICD-10-CM | POA: Diagnosis not present

## 2016-12-17 DIAGNOSIS — J449 Chronic obstructive pulmonary disease, unspecified: Secondary | ICD-10-CM | POA: Diagnosis not present

## 2016-12-17 DIAGNOSIS — I5042 Chronic combined systolic (congestive) and diastolic (congestive) heart failure: Secondary | ICD-10-CM | POA: Diagnosis not present

## 2016-12-17 DIAGNOSIS — R2681 Unsteadiness on feet: Secondary | ICD-10-CM | POA: Diagnosis not present

## 2016-12-17 DIAGNOSIS — I110 Hypertensive heart disease with heart failure: Secondary | ICD-10-CM | POA: Diagnosis not present

## 2016-12-18 ENCOUNTER — Encounter: Payer: Self-pay | Admitting: Family Medicine

## 2016-12-18 ENCOUNTER — Other Ambulatory Visit: Payer: Self-pay

## 2016-12-18 ENCOUNTER — Ambulatory Visit (INDEPENDENT_AMBULATORY_CARE_PROVIDER_SITE_OTHER): Payer: Medicare Other | Admitting: Family Medicine

## 2016-12-18 DIAGNOSIS — M79645 Pain in left finger(s): Secondary | ICD-10-CM | POA: Diagnosis not present

## 2016-12-18 NOTE — Patient Outreach (Signed)
[facility]) Care Management  12/18/2016  MICHI HERRMANN Nov 21, 1928 [id number]  Transition of care  Week # 1 Referral date: 12/18/16 Referral source: Status post skilled nursing facility discharge on 12/07/16 Program:  Insurance: [employer] Attempt #1  Telephone call to patient regarding transition of care follow up. Unable to reach patient. HIPAA compliant voice message left with call back phone number.    PLAN: RNCM will attempt 2nd telephone outreach to patient within 3 business days.   Quinn Plowman RN,BSN,CCM [facility] Telephonic  [phone]

## 2016-12-18 NOTE — Progress Notes (Signed)
  Tommi Rumps, MD Phone: [phone]  Haley Harris is a 83 y.o. female who presents today for follow-up.  Patient was seen several days ago for left DIP index finger discomfort and erythema. Initially felt to be possible cellulitis though uric acid was checked and was significantly elevated. She was started on low-dose prednisone for anti-inflammatory. She is on day 3 of treatment. She notes her symptoms are significantly better. She is back to her baseline joint discomfort. She has no erythema. She has had no fluid retention with the prednisone.  ROS see history of present illness  Objective  Physical Exam Vitals:   12/18/16 0945  BP: 112/62  Pulse: 77  Temp: 98.6 F (37 C)    BP Readings from Last 3 Encounters:  12/18/16 112/62  12/15/16 116/70  12/04/16 (!) 129/57   Wt Readings from Last 3 Encounters:  12/18/16 177 lb 5.8 oz (80.4 kg)  12/15/16 177 lb (80.3 kg)  12/04/16 179 lb 3.2 oz (81.3 kg)    Physical Exam  Constitutional: No distress.  Cardiovascular: Normal rate, regular rhythm and normal heart sounds.   Pulmonary/Chest: Effort normal and breath sounds normal.  Musculoskeletal:  Left index finger DIP joint with no tenderness, warmth, or erythema, sensation light touch intact left index finger, good capillary refill left index finger  Skin: She is not diaphoretic.     Assessment/Plan: Please see individual problem list.  Pain of finger of left hand Significantly improved. Suspect related to gout given elevated uric acid and improvement with prednisone. Potentially could've an OA flare as well. Discussed having patient follow up with her PCP as scheduled next week. Could consider uric acid lowering therapy depending on patient preference and whether or not she has a flare in the future. She'll finish the course of prednisone. She can continue the topical Voltaren. She will monitor her weight and breathing. If worsen she'll let us know.  Tommi Rumps,  MD [place]

## 2016-12-18 NOTE — Assessment & Plan Note (Signed)
Significantly improved. Suspect related to gout given elevated uric acid and improvement with prednisone. Potentially could've an OA flare as well. Discussed having patient follow up with her PCP as scheduled next week. Could consider uric acid lowering therapy depending on patient preference and whether or not she has a flare in the future. She'll finish the course of prednisone. She can continue the topical Voltaren. She will monitor her weight and breathing. If worsen she'll let us know.

## 2016-12-18 NOTE — Patient Instructions (Signed)
Nice to see you. I am glad you're improved. Please finish the taper of prednisone. If your symptoms returned please let us know. Please monitor your weight.

## 2016-12-21 ENCOUNTER — Other Ambulatory Visit: Payer: Self-pay

## 2016-12-21 DIAGNOSIS — I5042 Chronic combined systolic (congestive) and diastolic (congestive) heart failure: Secondary | ICD-10-CM | POA: Diagnosis not present

## 2016-12-21 DIAGNOSIS — I110 Hypertensive heart disease with heart failure: Secondary | ICD-10-CM | POA: Diagnosis not present

## 2016-12-21 DIAGNOSIS — J449 Chronic obstructive pulmonary disease, unspecified: Secondary | ICD-10-CM | POA: Diagnosis not present

## 2016-12-21 DIAGNOSIS — I2510 Atherosclerotic heart disease of native coronary artery without angina pectoris: Secondary | ICD-10-CM | POA: Diagnosis not present

## 2016-12-21 DIAGNOSIS — R2681 Unsteadiness on feet: Secondary | ICD-10-CM | POA: Diagnosis not present

## 2016-12-21 NOTE — Patient Outreach (Signed)
[place] [facility]) Care Management  12/21/2016  Haley Harris [birth year] [id number]   Transition of care  Week # 1 Referral date: 12/18/16 Referral source: Status post skilled nursing facility discharge on 12/07/16 Program:  Insurance: [employer] care Attempt #2  Telephone call to patient regarding transition of care follow up. Attempted calls to listed home number and mobile number.  Unable to reach patient. HIPAA compliant voice message left with call back phone number.    PLAN: RNCM will attempt 3rd telephone outreach to patient within 3 business days.   Quinn Plowman RN,BSN,CCM [facility] Telephonic  [phone]

## 2016-12-22 ENCOUNTER — Other Ambulatory Visit: Payer: Self-pay

## 2016-12-22 DIAGNOSIS — I2510 Atherosclerotic heart disease of native coronary artery without angina pectoris: Secondary | ICD-10-CM | POA: Diagnosis not present

## 2016-12-22 DIAGNOSIS — I5042 Chronic combined systolic (congestive) and diastolic (congestive) heart failure: Secondary | ICD-10-CM | POA: Diagnosis not present

## 2016-12-22 DIAGNOSIS — J449 Chronic obstructive pulmonary disease, unspecified: Secondary | ICD-10-CM | POA: Diagnosis not present

## 2016-12-22 DIAGNOSIS — R2681 Unsteadiness on feet: Secondary | ICD-10-CM | POA: Diagnosis not present

## 2016-12-22 DIAGNOSIS — I110 Hypertensive heart disease with heart failure: Secondary | ICD-10-CM | POA: Diagnosis not present

## 2016-12-22 NOTE — Patient Outreach (Signed)
Haley Harris  12/22/2016  Haley Harris Oct 13, 1928 [id number]  Transition of care  Week # 1 Referral date: 12/18/16 Referral source: Status post skilled nursing facility discharge on 12/07/16 Program:  Insurance: [employer] health care Attempt #2  Telephone call to patient regarding transition of care follow up. Attempted calls to listed home number and mobile number.  Unable to reach patient. HIPAA compliant voice message left with call back phone number.    PLAN: RNCM will send outreach letter to patient to attempt contact.  Quinn Plowman RN,BSN,CCM [facility] Telephonic  [phone]

## 2016-12-23 ENCOUNTER — Encounter: Payer: Self-pay | Admitting: Internal Medicine

## 2016-12-23 ENCOUNTER — Ambulatory Visit (INDEPENDENT_AMBULATORY_CARE_PROVIDER_SITE_OTHER): Payer: Medicare Other | Admitting: Internal Medicine

## 2016-12-23 VITALS — BP 132/62 | HR 69 | Temp 98.4°F | Wt 179.2 lb

## 2016-12-23 DIAGNOSIS — N183 Chronic kidney disease, stage 3 unspecified: Secondary | ICD-10-CM

## 2016-12-23 DIAGNOSIS — J449 Chronic obstructive pulmonary disease, unspecified: Secondary | ICD-10-CM | POA: Diagnosis not present

## 2016-12-23 DIAGNOSIS — I5042 Chronic combined systolic (congestive) and diastolic (congestive) heart failure: Secondary | ICD-10-CM | POA: Diagnosis not present

## 2016-12-23 DIAGNOSIS — M109 Gout, unspecified: Secondary | ICD-10-CM | POA: Insufficient documentation

## 2016-12-23 LAB — RENAL FUNCTION PANEL
ALBUMIN: 3.9 g/dL (ref 3.5–5.2)
BUN: 62 mg/dL — AB (ref 6–23)
CHLORIDE: 98 meq/L (ref 96–112)
CO2: 34 mEq/L — ABNORMAL HIGH (ref 19–32)
Calcium: 9.4 mg/dL (ref 8.4–10.5)
Creatinine, Ser: 1.7 mg/dL — ABNORMAL HIGH (ref 0.40–1.20)
GFR: 30.31 mL/min — ABNORMAL LOW (ref >60.00)
GLUCOSE: 113 mg/dL — AB (ref 70–99)
Phosphorus: 4 mg/dL (ref 2.3–4.6)
Potassium: 4.6 mEq/L (ref 3.5–5.1)
SODIUM: 138 meq/L (ref 135–145)

## 2016-12-23 NOTE — Progress Notes (Signed)
Subjective:    Patient ID: Haley Harris, female    DOB: 1928/10/28, 83 y.o.   MRN: [id number]  HPI Here for follow up after hospitalization and rehab stay With daughter [name] 6 days at hospital with CHF exacerbation Had cath but no percutaneous intervention Lots of diuresis 17 days at [facility] now at home to help bathing--twice a week Has PT 3 days per week Walks with rollator Daughter does all instrumental ADLs (other than helping with laundry--but not carrying)  Gout attack in left 2nd finger--seen at [facility] Treated for possible infection Uric acid very high Took quick burst of prednisone  Breathing is stable DOE very easy--has to take breaks during PT Uses nebulizer every morning Avoids the heat and humidity Chronic tickle cough and runny nose  No chest pain No palpitations No edema lately  Ran out of escitalopram--didn't realize Mood is better since restarting this  Current Outpatient Prescriptions on File Prior to Visit  Medication Sig Dispense Refill  . acetaminophen (TYLENOL) 500 MG tablet Take 1,000 mg by mouth every 8 (eight) hours.    Marland Kitchen albuterol (PROVENTIL) (2.5 MG/3ML) 0.083% nebulizer solution Take 3 mLs (2.5 mg total) by nebulization every 6 (six) hours as needed for wheezing or shortness of breath. 540 mL 3  . aspirin 81 MG tablet Take 81 mg by mouth daily after lunch.     Marland Kitchen atorvastatin (LIPITOR) 40 MG tablet Take 1 tablet (40 mg total) by mouth daily at 6 PM. 30 tablet 3  . bisacodyl (DULCOLAX) 5 MG EC tablet Take 2 tablets (10 mg total) by mouth daily as needed for moderate constipation. 30 tablet 0  . Calcium Carbonate-Vitamin D (CALTRATE 600+D PO) Take 1 tablet by mouth daily after lunch.     . carvedilol (COREG) 6.25 MG tablet Take 1 tablet (6.25 mg total) by mouth 2 (two) times daily with a meal. Take 1 tablet by mouth two  times daily with a meal 60 tablet 3  . denosumab (PROLIA) 60 MG/ML SOLN injection Inject 60 mg into the skin  every 6 (six) months. Reported on 11/05/2015    . diclofenac sodium (VOLTAREN) 1 % GEL Apply 2 g topically 2 (two) times daily. Apply to neck and to each shoulder    . escitalopram (LEXAPRO) 10 MG tablet Take 1 tablet (10 mg total) by mouth daily. 15 tablet 0  . famotidine (PEPCID) 20 MG tablet Take 1 tablet (20 mg total) by mouth 2 (two) times daily. 180 tablet 3  . fexofenadine (ALLEGRA) 180 MG tablet Take 180 mg by mouth at bedtime.    . fluticasone (FLONASE) 50 MCG/ACT nasal spray Use 2 sprays in each  nostril daily 48 g 3  . furosemide (LASIX) 40 MG tablet Take 1 tablet (40 mg total) by mouth 2 (two) times daily. Take two tabs BID with weight gain 60 tablet 3  . gabapentin (NEURONTIN) 100 MG capsule TAKE 1 CAPSULE BY MOUTH TWO TIMES DAILY 180 capsule 3  . levocetirizine (XYZAL) 5 MG tablet Take 5 mg by mouth every evening.    Marland Kitchen losartan (COZAAR) 50 MG tablet Take 1 tablet by mouth  daily 90 tablet 1  . Melatonin 3 MG TABS Take 1 tablet by mouth at bedtime.    . montelukast (SINGULAIR) 10 MG tablet TAKE 1 TABLET BY MOUTH DAILY 90 tablet 3  . Multiple Vitamin (MULITIVITAMIN WITH MINERALS) TABS Take 1 tablet by mouth daily after lunch.     . nitroGLYCERIN (  NITROSTAT) 0.4 MG SL tablet Place 1 tablet (0.4 mg total) under the tongue every 5 (five) minutes x 3 doses as needed for chest pain. 25 tablet 12  . PROAIR HFA 108 (90 Base) MCG/ACT inhaler USE 2 PUFFS EVERY 6 HOURS AS NEEDED FOR WHEEZING OR SHORTNESS OF BREATH 8.5 g 5  . spironolactone (ALDACTONE) 25 MG tablet TAKE 1 TABLET BY MOUTH  DAILY 90 tablet 3  . vitamin B-12 (CYANOCOBALAMIN) 500 MCG tablet Take 1,000 mcg by mouth 3 (three) times a week. Mon, Wed, Fri     No current facility-administered medications on file prior to visit.     Allergies  Allergen Reactions  . Amoxicillin-Pot Clavulanate Anaphylaxis  . Penicillins Anaphylaxis  . Cephalexin Other (See Comments)    Reaction unknown  . Clarithromycin Other (See Comments)     Reaction unknown  . Lidocaine     Patient had tremors following use of lidocaine pain patches  . Nifedipine Other (See Comments)    Reaction unknown  . Nsaids Other (See Comments)    Heart issue  . Olmesartan Medoxomil     REACTION: cough;  But tolerating Losartan 02/2014  . Tramadol Hcl Other (See Comments)    loopy    Past Medical History:  Diagnosis Date  . ACE-inhibitor cough   . Allergic rhinitis, cause unspecified   . Angina   . Anxiety   . CHF (congestive heart failure) ([place])   . Chronic airway obstruction, not elsewhere classified   . Compression fracture of T12 vertebra ([place]) 10/28/2011  . Depressive disorder, not elsewhere classified   . Esophageal reflux   . Gout   . Heart murmur    aS CHILD  . Malignant neoplasm of breast (female), unspecified site   . Myocardial infarct ([place])   . 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 Occlusion and stenosis of carotid artery without mention of cerebral infarction   . Osteoarthrosis, unspecified whether generalized or localized, unspecified site   . Osteoporosis, unspecified   . Other and unspecified hyperlipidemia   . Other dyspnea and respiratory abnormality   . Oxygen dependent   . Peripheral vascular disease, unspecified ([place])   . Personal history of malignant neoplasm of breast   . Pneumonia   . Recurrent upper respiratory infection (URI)   . Shortness of breath   . Spinal stenosis   . Unspecified cardiovascular disease   . Unspecified essential hypertension   . Unspecified hearing loss   . Unspecified urinary incontinence     Past Surgical History:  Procedure Laterality Date  . ABDOMINAL HYSTERECTOMY    . APPENDECTOMY  1948  . BREAST IMPLANT REMOVAL  06/12/09   right  . BREAST RECONSTRUCTION  1998   Reconstruction   . CARDIAC CATHETERIZATION N/A 01/25/2015   Procedure: Left Heart Cath and Coronary Angiography;  Surgeon: Charolette Forward, MD;  Location: [place] CV LAB;  Service:  Cardiovascular;  Laterality: N/A;  . CORONARY ANGIOPLASTY  11/12   distal RCA  . FLEXIBLE SIGMOIDOSCOPY N/A 01/21/2015   Procedure: FLEXIBLE SIGMOIDOSCOPY;  Surgeon: Richmond Campbell, MD;  Location: [facility] ENDOSCOPY;  Service: Endoscopy;  Laterality: N/A;  . FLEXIBLE SIGMOIDOSCOPY  01/22/2015      . FRACTURE SURGERY     fracture right elbow  . LEFT HEART CATH AND CORONARY ANGIOGRAPHY N/A 11/14/2016   Procedure: Left Heart Cath and Coronary Angiography;  Surgeon: Charolette Forward, MD;  Location: [place] CV LAB;  Service: Cardiovascular;  Laterality: N/A;  .  LEFT HEART CATHETERIZATION WITH CORONARY ANGIOGRAM N/A 06/05/2011   Procedure: LEFT HEART CATHETERIZATION WITH CORONARY ANGIOGRAM;  Surgeon: Clent Demark, MD;  Location: [facility] CATH LAB;  Service: Cardiovascular;  Laterality: N/A;  . LEFT HEART CATHETERIZATION WITH CORONARY ANGIOGRAM N/A 03/05/2014   Procedure: LEFT HEART CATHETERIZATION WITH CORONARY ANGIOGRAM;  Surgeon: Clent Demark, MD;  Location: [place] CATH LAB;  Service: Cardiovascular;  Laterality: N/A;  . LUMBAR LAMINECTOMY  2003  . MASTECTOMY  1987   Right  . MASTECTOMY    . MASTOIDECTOMY  childhood  . REDUCTION MAMMAPLASTY  1998   Left  . SHOULDER SURGERY  02/2005   Bilateral fractures with multiple surgeries    Family History  Problem Relation Age of Onset  . Heart attack Father 4  . Cancer Mother        ovarian  . Cancer Brother        lung  . Arthritis Unknown        family    Social History   Social History  . Marital status: Widowed    Spouse name: N/A  . Number of children: 2  . Years of education: N/A   Occupational History  . 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  [employer]   Social History Main Topics  . Smoking status: Former Smoker    Quit date: 07/13/1974  . Smokeless tobacco: Never Used  . Alcohol use 0.0 oz/week     Comment: occasional  . Drug use: No  . Sexual activity: No   Other Topics Concern  . Not on file   Social History  Narrative   Has living will   Son or daughter would be health care POA.   Requests DNR--written 03/02/13   No tube feeds if cognitively unaware    Review of Systems Sleeping well with melatonin Appetite was increased with the prednisone--now still okay Weighs daily--- fairly stable (was up with the prednisone, but coming down again)    Objective:   Physical Exam  Constitutional: She appears well-developed. No distress.  Neck: No thyromegaly present.  Cardiovascular: Normal rate, regular rhythm and normal heart sounds.  Exam reveals no gallop.   No murmur heard. Pulmonary/Chest: Effort normal and breath sounds normal. No respiratory distress. She has no wheezes. She has no rales.  Musculoskeletal: She exhibits no edema.  Finger not swollen or tender now  Lymphadenopathy:    She has no cervical adenopathy.  Psychiatric: She has a normal mood and affect. Her behavior is normal.          Assessment & Plan:

## 2016-12-23 NOTE — Assessment & Plan Note (Signed)
Part of the limited exercise tolerance Seems to be relatively controlled

## 2016-12-23 NOTE — Assessment & Plan Note (Signed)
Sudden exacerbation causing dyspnea Better now after diuresis in hospital and rehab stay Back home Monitoring weight, etc

## 2016-12-23 NOTE — Assessment & Plan Note (Signed)
Creatinine up last month Will recheck

## 2016-12-23 NOTE — Assessment & Plan Note (Signed)
Better now Could probably consider colchicine--but probably best to use prednisone again prn

## 2016-12-24 ENCOUNTER — Other Ambulatory Visit: Payer: Self-pay | Admitting: Internal Medicine

## 2016-12-24 DIAGNOSIS — N183 Chronic kidney disease, stage 3 unspecified: Secondary | ICD-10-CM

## 2016-12-24 DIAGNOSIS — I2510 Atherosclerotic heart disease of native coronary artery without angina pectoris: Secondary | ICD-10-CM | POA: Diagnosis not present

## 2016-12-24 DIAGNOSIS — I110 Hypertensive heart disease with heart failure: Secondary | ICD-10-CM | POA: Diagnosis not present

## 2016-12-24 DIAGNOSIS — Z7189 Other specified counseling: Secondary | ICD-10-CM

## 2016-12-24 DIAGNOSIS — I5042 Chronic combined systolic (congestive) and diastolic (congestive) heart failure: Secondary | ICD-10-CM | POA: Diagnosis not present

## 2016-12-24 DIAGNOSIS — J449 Chronic obstructive pulmonary disease, unspecified: Secondary | ICD-10-CM | POA: Diagnosis not present

## 2016-12-24 DIAGNOSIS — R2681 Unsteadiness on feet: Secondary | ICD-10-CM | POA: Diagnosis not present

## 2016-12-25 DIAGNOSIS — I2510 Atherosclerotic heart disease of native coronary artery without angina pectoris: Secondary | ICD-10-CM | POA: Diagnosis not present

## 2016-12-25 DIAGNOSIS — I110 Hypertensive heart disease with heart failure: Secondary | ICD-10-CM | POA: Diagnosis not present

## 2016-12-25 DIAGNOSIS — R2681 Unsteadiness on feet: Secondary | ICD-10-CM | POA: Diagnosis not present

## 2016-12-25 DIAGNOSIS — J449 Chronic obstructive pulmonary disease, unspecified: Secondary | ICD-10-CM | POA: Diagnosis not present

## 2016-12-25 DIAGNOSIS — I5042 Chronic combined systolic (congestive) and diastolic (congestive) heart failure: Secondary | ICD-10-CM | POA: Diagnosis not present

## 2016-12-28 DIAGNOSIS — I110 Hypertensive heart disease with heart failure: Secondary | ICD-10-CM | POA: Diagnosis not present

## 2016-12-28 DIAGNOSIS — I2510 Atherosclerotic heart disease of native coronary artery without angina pectoris: Secondary | ICD-10-CM | POA: Diagnosis not present

## 2016-12-28 DIAGNOSIS — J449 Chronic obstructive pulmonary disease, unspecified: Secondary | ICD-10-CM | POA: Diagnosis not present

## 2016-12-28 DIAGNOSIS — I5042 Chronic combined systolic (congestive) and diastolic (congestive) heart failure: Secondary | ICD-10-CM | POA: Diagnosis not present

## 2016-12-28 DIAGNOSIS — R2681 Unsteadiness on feet: Secondary | ICD-10-CM | POA: Diagnosis not present

## 2016-12-29 DIAGNOSIS — I2510 Atherosclerotic heart disease of native coronary artery without angina pectoris: Secondary | ICD-10-CM | POA: Diagnosis not present

## 2016-12-29 DIAGNOSIS — R2681 Unsteadiness on feet: Secondary | ICD-10-CM | POA: Diagnosis not present

## 2016-12-29 DIAGNOSIS — I5042 Chronic combined systolic (congestive) and diastolic (congestive) heart failure: Secondary | ICD-10-CM | POA: Diagnosis not present

## 2016-12-29 DIAGNOSIS — J449 Chronic obstructive pulmonary disease, unspecified: Secondary | ICD-10-CM | POA: Diagnosis not present

## 2016-12-29 DIAGNOSIS — I110 Hypertensive heart disease with heart failure: Secondary | ICD-10-CM | POA: Diagnosis not present

## 2016-12-31 DIAGNOSIS — I110 Hypertensive heart disease with heart failure: Secondary | ICD-10-CM | POA: Diagnosis not present

## 2016-12-31 DIAGNOSIS — R2681 Unsteadiness on feet: Secondary | ICD-10-CM | POA: Diagnosis not present

## 2016-12-31 DIAGNOSIS — I5042 Chronic combined systolic (congestive) and diastolic (congestive) heart failure: Secondary | ICD-10-CM | POA: Diagnosis not present

## 2016-12-31 DIAGNOSIS — J449 Chronic obstructive pulmonary disease, unspecified: Secondary | ICD-10-CM | POA: Diagnosis not present

## 2016-12-31 DIAGNOSIS — I2510 Atherosclerotic heart disease of native coronary artery without angina pectoris: Secondary | ICD-10-CM | POA: Diagnosis not present

## 2017-01-01 DIAGNOSIS — I2510 Atherosclerotic heart disease of native coronary artery without angina pectoris: Secondary | ICD-10-CM | POA: Diagnosis not present

## 2017-01-01 DIAGNOSIS — J449 Chronic obstructive pulmonary disease, unspecified: Secondary | ICD-10-CM | POA: Diagnosis not present

## 2017-01-01 DIAGNOSIS — R2681 Unsteadiness on feet: Secondary | ICD-10-CM | POA: Diagnosis not present

## 2017-01-01 DIAGNOSIS — I110 Hypertensive heart disease with heart failure: Secondary | ICD-10-CM | POA: Diagnosis not present

## 2017-01-01 DIAGNOSIS — I5042 Chronic combined systolic (congestive) and diastolic (congestive) heart failure: Secondary | ICD-10-CM | POA: Diagnosis not present

## 2017-01-04 DIAGNOSIS — R2681 Unsteadiness on feet: Secondary | ICD-10-CM | POA: Diagnosis not present

## 2017-01-04 DIAGNOSIS — I110 Hypertensive heart disease with heart failure: Secondary | ICD-10-CM | POA: Diagnosis not present

## 2017-01-04 DIAGNOSIS — I2510 Atherosclerotic heart disease of native coronary artery without angina pectoris: Secondary | ICD-10-CM | POA: Diagnosis not present

## 2017-01-04 DIAGNOSIS — J449 Chronic obstructive pulmonary disease, unspecified: Secondary | ICD-10-CM | POA: Diagnosis not present

## 2017-01-04 DIAGNOSIS — I5042 Chronic combined systolic (congestive) and diastolic (congestive) heart failure: Secondary | ICD-10-CM | POA: Diagnosis not present

## 2017-01-05 ENCOUNTER — Other Ambulatory Visit: Payer: Self-pay

## 2017-01-05 DIAGNOSIS — I2510 Atherosclerotic heart disease of native coronary artery without angina pectoris: Secondary | ICD-10-CM | POA: Diagnosis not present

## 2017-01-05 DIAGNOSIS — I5042 Chronic combined systolic (congestive) and diastolic (congestive) heart failure: Secondary | ICD-10-CM | POA: Diagnosis not present

## 2017-01-05 DIAGNOSIS — J449 Chronic obstructive pulmonary disease, unspecified: Secondary | ICD-10-CM | POA: Diagnosis not present

## 2017-01-05 DIAGNOSIS — R2681 Unsteadiness on feet: Secondary | ICD-10-CM | POA: Diagnosis not present

## 2017-01-05 DIAGNOSIS — I110 Hypertensive heart disease with heart failure: Secondary | ICD-10-CM | POA: Diagnosis not present

## 2017-01-05 NOTE — Patient Outreach (Signed)
[place] [facility]) Care Management  01/05/2017  Haley Harris 29-Dec-1928 [id number]  Transition of care  Week # 1 Referral date: 12/18/16 Referral source: Status post skilled nursing facility discharge on 12/07/16 Program:  Insurance: United health care  No response from patient after 3 telephone calls and outreach letter attempt  PLAN: RNCM will refer patient to care management assistant to close due to being unable to contact. RNCM will notify patients primary MD of closure.   Quinn Plowman RN,BSN,CCM [facility] Telephonic  [phone]

## 2017-01-06 ENCOUNTER — Telehealth: Payer: Self-pay

## 2017-01-06 ENCOUNTER — Other Ambulatory Visit (INDEPENDENT_AMBULATORY_CARE_PROVIDER_SITE_OTHER): Payer: Medicare Other

## 2017-01-06 DIAGNOSIS — N183 Chronic kidney disease, stage 3 unspecified: Secondary | ICD-10-CM

## 2017-01-06 LAB — RENAL FUNCTION PANEL
ALBUMIN: 3.7 g/dL (ref 3.5–5.2)
BUN: 27 mg/dL — AB (ref 6–23)
CO2: 31 meq/L (ref 19–32)
CREATININE: 1.25 mg/dL — AB (ref 0.40–1.20)
Calcium: 9.2 mg/dL (ref 8.4–10.5)
Chloride: 89 mEq/L — ABNORMAL LOW (ref 96–112)
GFR: 43.21 mL/min — ABNORMAL LOW (ref >60.00)
Glucose, Bld: 118 mg/dL — ABNORMAL HIGH (ref 70–99)
PHOSPHORUS: 5.1 mg/dL — AB (ref 2.3–4.6)
Potassium: 4.1 mEq/L (ref 3.5–5.1)
SODIUM: 125 meq/L — AB (ref 135–145)

## 2017-01-06 NOTE — Telephone Encounter (Signed)
Haley Harris from [place] at [facility] requesting a verbal order for continued home PT 2x week for 4 weeks to improve activity tolerance, balance, and strengthening

## 2017-01-07 DIAGNOSIS — I2510 Atherosclerotic heart disease of native coronary artery without angina pectoris: Secondary | ICD-10-CM | POA: Diagnosis not present

## 2017-01-07 DIAGNOSIS — I110 Hypertensive heart disease with heart failure: Secondary | ICD-10-CM | POA: Diagnosis not present

## 2017-01-07 DIAGNOSIS — I5042 Chronic combined systolic (congestive) and diastolic (congestive) heart failure: Secondary | ICD-10-CM | POA: Diagnosis not present

## 2017-01-07 DIAGNOSIS — J449 Chronic obstructive pulmonary disease, unspecified: Secondary | ICD-10-CM | POA: Diagnosis not present

## 2017-01-07 DIAGNOSIS — R2681 Unsteadiness on feet: Secondary | ICD-10-CM | POA: Diagnosis not present

## 2017-01-07 NOTE — Telephone Encounter (Signed)
That is fine 

## 2017-01-07 NOTE — Telephone Encounter (Signed)
Verbal orders left on Erin's Vm.

## 2017-01-11 ENCOUNTER — Ambulatory Visit (INDEPENDENT_AMBULATORY_CARE_PROVIDER_SITE_OTHER): Payer: Medicare Other | Admitting: Podiatry

## 2017-01-11 DIAGNOSIS — B351 Tinea unguium: Secondary | ICD-10-CM | POA: Diagnosis not present

## 2017-01-11 DIAGNOSIS — L03031 Cellulitis of right toe: Secondary | ICD-10-CM

## 2017-01-11 DIAGNOSIS — M79609 Pain in unspecified limb: Secondary | ICD-10-CM | POA: Diagnosis not present

## 2017-01-11 NOTE — Progress Notes (Signed)
Complaint:  Visit Type: Patient returns to my office for continued preventative foot care services. Complaint: Patient states" my nails have grown long and thick and become painful to walk and wear shoes". The patient presents for preventative foot care services. No changes to ROS.    Podiatric Exam: Vascular: dorsalis pedis and posterior tibial pulses are palpable bilateral. Capillary return is immediate. Temperature gradient is WNL. Skin turgor WNL  Sensorium: Normal Semmes Weinstein monofilament test. Normal tactile sensation bilaterally. Nail Exam: Pt has thick disfigured discolored nails with subungual debris noted bilateral entire nail hallux through fifth toenails.  There is redness and swelling noted subungually great toe right foot. Ulcer Exam: There is no evidence of ulcer or pre-ulcerative changes or infection. Orthopedic Exam: Muscle tone and strength are WNL. No limitations in general ROM. No crepitus or effusions noted. Foot type and digits show no abnormalities. Bony prominences are unremarkable. Skin: No Porokeratosis. No infection or ulcers  Diagnosis:  Onychomycosis, , Pain in right toe, pain in left toe  Paronychia right hallux  Treatment & Plan Procedures and Treatment: Consent by patient was obtained for treatment procedures. The patient understood the discussion of treatment and procedures well. All questions were answered thoroughly reviewed. Debridement of mycotic and hypertrophic toenails, 1 through 5 bilateral and clearing of subungual debris. No ulceration, no infection noted.  Patient was instructed to soak her right great toenail call if the toe becomes infected.; Return Visit-Office Procedure: Patient instructed to return to the office for a follow up visit 3 months for continued evaluation and treatment.    Gardiner Barefoot DPM

## 2017-01-12 DIAGNOSIS — I2510 Atherosclerotic heart disease of native coronary artery without angina pectoris: Secondary | ICD-10-CM | POA: Diagnosis not present

## 2017-01-12 DIAGNOSIS — J449 Chronic obstructive pulmonary disease, unspecified: Secondary | ICD-10-CM | POA: Diagnosis not present

## 2017-01-12 DIAGNOSIS — I5042 Chronic combined systolic (congestive) and diastolic (congestive) heart failure: Secondary | ICD-10-CM | POA: Diagnosis not present

## 2017-01-12 DIAGNOSIS — R2681 Unsteadiness on feet: Secondary | ICD-10-CM | POA: Diagnosis not present

## 2017-01-12 DIAGNOSIS — I110 Hypertensive heart disease with heart failure: Secondary | ICD-10-CM | POA: Diagnosis not present

## 2017-01-14 DIAGNOSIS — I110 Hypertensive heart disease with heart failure: Secondary | ICD-10-CM | POA: Diagnosis not present

## 2017-01-14 DIAGNOSIS — I5042 Chronic combined systolic (congestive) and diastolic (congestive) heart failure: Secondary | ICD-10-CM | POA: Diagnosis not present

## 2017-01-14 DIAGNOSIS — I2510 Atherosclerotic heart disease of native coronary artery without angina pectoris: Secondary | ICD-10-CM | POA: Diagnosis not present

## 2017-01-14 DIAGNOSIS — J449 Chronic obstructive pulmonary disease, unspecified: Secondary | ICD-10-CM | POA: Diagnosis not present

## 2017-01-14 DIAGNOSIS — R2681 Unsteadiness on feet: Secondary | ICD-10-CM | POA: Diagnosis not present

## 2017-01-18 ENCOUNTER — Telehealth: Payer: Self-pay | Admitting: Internal Medicine

## 2017-01-18 DIAGNOSIS — I2510 Atherosclerotic heart disease of native coronary artery without angina pectoris: Secondary | ICD-10-CM | POA: Diagnosis not present

## 2017-01-18 DIAGNOSIS — I5042 Chronic combined systolic (congestive) and diastolic (congestive) heart failure: Secondary | ICD-10-CM | POA: Diagnosis not present

## 2017-01-18 DIAGNOSIS — J449 Chronic obstructive pulmonary disease, unspecified: Secondary | ICD-10-CM | POA: Diagnosis not present

## 2017-01-18 DIAGNOSIS — I110 Hypertensive heart disease with heart failure: Secondary | ICD-10-CM | POA: Diagnosis not present

## 2017-01-18 DIAGNOSIS — R2681 Unsteadiness on feet: Secondary | ICD-10-CM | POA: Diagnosis not present

## 2017-01-18 NOTE — Telephone Encounter (Signed)
Find out specifically what they want to extend---if it is the home aide, yes

## 2017-01-18 NOTE — Telephone Encounter (Signed)
Haley Harris from [facility] called requesting extension on home care. Can reach her at [phone].

## 2017-01-19 ENCOUNTER — Other Ambulatory Visit: Payer: Self-pay | Admitting: Internal Medicine

## 2017-01-19 DIAGNOSIS — R2681 Unsteadiness on feet: Secondary | ICD-10-CM | POA: Diagnosis not present

## 2017-01-19 DIAGNOSIS — I110 Hypertensive heart disease with heart failure: Secondary | ICD-10-CM | POA: Diagnosis not present

## 2017-01-19 DIAGNOSIS — I2510 Atherosclerotic heart disease of native coronary artery without angina pectoris: Secondary | ICD-10-CM | POA: Diagnosis not present

## 2017-01-19 DIAGNOSIS — J449 Chronic obstructive pulmonary disease, unspecified: Secondary | ICD-10-CM | POA: Diagnosis not present

## 2017-01-19 DIAGNOSIS — I5042 Chronic combined systolic (congestive) and diastolic (congestive) heart failure: Secondary | ICD-10-CM | POA: Diagnosis not present

## 2017-01-19 NOTE — Telephone Encounter (Signed)
Spoke to [place]. She said Home PT is with her until 7-29. Needs extension of Home Aides for a few more weeks until recert. Gave verbal authorization.

## 2017-01-20 ENCOUNTER — Ambulatory Visit: Payer: Medicare Other | Admitting: Registered Nurse

## 2017-01-21 ENCOUNTER — Ambulatory Visit (INDEPENDENT_AMBULATORY_CARE_PROVIDER_SITE_OTHER)
Admission: RE | Admit: 2017-01-21 | Discharge: 2017-01-21 | Disposition: A | Discharge status: Home / Self Care | Payer: Medicare Other | Source: Ambulatory Visit | Attending: Internal Medicine | Admitting: Internal Medicine

## 2017-01-21 ENCOUNTER — Encounter: Payer: Self-pay | Admitting: Internal Medicine

## 2017-01-21 ENCOUNTER — Ambulatory Visit (INDEPENDENT_AMBULATORY_CARE_PROVIDER_SITE_OTHER): Payer: Medicare Other | Admitting: Internal Medicine

## 2017-01-21 VITALS — BP 130/86 | HR 82 | Temp 98.4°F | Wt 179.0 lb

## 2017-01-21 DIAGNOSIS — R0609 Other forms of dyspnea: Secondary | ICD-10-CM

## 2017-01-21 DIAGNOSIS — I5042 Chronic combined systolic (congestive) and diastolic (congestive) heart failure: Secondary | ICD-10-CM | POA: Diagnosis not present

## 2017-01-21 DIAGNOSIS — J9811 Atelectasis: Secondary | ICD-10-CM | POA: Diagnosis not present

## 2017-01-21 NOTE — Progress Notes (Signed)
Subjective:    Patient ID: Asa Saunas, female    DOB: 11-Aug-1928, 83 y.o.   MRN: [id number]  HPI Here with daughter  Getting SOB very easily since last week PT thought she may have some crackling 2 days ago  No chest pain No palpitations SOB before legs get tired--this is the limiting factor Had to stop going in and out of church this weekend Weighing herself--did go up over 4th of July. Did take extra furosemide after this  Still working with therapy Will have her 02 sats drop to 84-85% but recovers fairly quickly  Current Outpatient Prescriptions on File Prior to Visit  Medication Sig Dispense Refill  . acetaminophen (TYLENOL) 500 MG tablet Take 1,000 mg by mouth every 8 (eight) hours.    Marland Kitchen albuterol (PROVENTIL) (2.5 MG/3ML) 0.083% nebulizer solution Take 3 mLs (2.5 mg total) by nebulization every 6 (six) hours as needed for wheezing or shortness of breath. 540 mL 3  . aspirin 81 MG tablet Take 81 mg by mouth daily after lunch.     Marland Kitchen atorvastatin (LIPITOR) 40 MG tablet Take 1 tablet (40 mg total) by mouth daily at 6 PM. 30 tablet 3  . bisacodyl (DULCOLAX) 5 MG EC tablet Take 2 tablets (10 mg total) by mouth daily as needed for moderate constipation. 30 tablet 0  . Calcium Carbonate-Vitamin D (CALTRATE 600+D PO) Take 1 tablet by mouth daily after lunch.     . carvedilol (COREG) 6.25 MG tablet Take 1 tablet (6.25 mg total) by mouth 2 (two) times daily with a meal. Take 1 tablet by mouth two  times daily with a meal 60 tablet 3  . denosumab (PROLIA) 60 MG/ML SOLN injection Inject 60 mg into the skin every 6 (six) months. Reported on 11/05/2015    . diclofenac sodium (VOLTAREN) 1 % GEL Apply 2 g topically 2 (two) times daily. Apply to neck and to each shoulder    . escitalopram (LEXAPRO) 10 MG tablet Take 1 tablet (10 mg total) by mouth daily. 15 tablet 0  . famotidine (PEPCID) 20 MG tablet Take 1 tablet (20 mg total) by mouth 2 (two) times daily. 180 tablet 3  . fexofenadine  (ALLEGRA) 180 MG tablet Take 180 mg by mouth at bedtime.    . fluticasone (FLONASE) 50 MCG/ACT nasal spray Use 2 sprays in each  nostril daily 48 g 3  . furosemide (LASIX) 40 MG tablet Take 1 tablet (40 mg total) by mouth 2 (two) times daily. Take two tabs BID with weight gain 60 tablet 3  . gabapentin (NEURONTIN) 100 MG capsule TAKE 1 CAPSULE BY MOUTH TWO TIMES DAILY 180 capsule 3  . levocetirizine (XYZAL) 5 MG tablet Take 5 mg by mouth every evening.    Marland Kitchen losartan (COZAAR) 50 MG tablet TAKE 1 TABLET BY MOUTH  DAILY 90 tablet 3  . Melatonin 3 MG TABS Take 1 tablet by mouth at bedtime.    . montelukast (SINGULAIR) 10 MG tablet TAKE 1 TABLET BY MOUTH DAILY 90 tablet 3  . Multiple Vitamin (MULITIVITAMIN WITH MINERALS) TABS Take 1 tablet by mouth daily after lunch.     . nitroGLYCERIN (NITROSTAT) 0.4 MG SL tablet Place 1 tablet (0.4 mg total) under the tongue every 5 (five) minutes x 3 doses as needed for chest pain. 25 tablet 12  . PROAIR HFA 108 (90 Base) MCG/ACT inhaler USE 2 PUFFS EVERY 6 HOURS AS NEEDED FOR WHEEZING OR SHORTNESS OF BREATH 8.5 g 5  .  spironolactone (ALDACTONE) 25 MG tablet TAKE 1 TABLET BY MOUTH  DAILY 90 tablet 3  . vitamin B-12 (CYANOCOBALAMIN) 500 MCG tablet Take 1,000 mcg by mouth 3 (three) times a week. Mon, Wed, Fri     No current facility-administered medications on file prior to visit.     Allergies  Allergen Reactions  . Amoxicillin-Pot Clavulanate Anaphylaxis  . Penicillins Anaphylaxis  . Cephalexin Other (See Comments)    Reaction unknown  . Clarithromycin Other (See Comments)    Reaction unknown  . Lidocaine     Patient had tremors following use of lidocaine pain patches  . Nifedipine Other (See Comments)    Reaction unknown  . Nsaids Other (See Comments)    Heart issue  . Olmesartan Medoxomil     REACTION: cough;  But tolerating Losartan 02/2014  . Tramadol Hcl Other (See Comments)    loopy    Past Medical History:  Diagnosis Date  . ACE-inhibitor  cough   . Allergic rhinitis, cause unspecified   . Angina   . Anxiety   . CHF (congestive heart failure) ([place])   . Chronic airway obstruction, not elsewhere classified   . Compression fracture of T12 vertebra ([place]) 10/28/2011  . Depressive disorder, not elsewhere classified   . Esophageal reflux   . Gout   . Heart murmur    aS CHILD  . Malignant neoplasm of breast (female), unspecified site   . Myocardial infarct ([place])   . 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 Occlusion and stenosis of carotid artery without mention of cerebral infarction   . Osteoarthrosis, unspecified whether generalized or localized, unspecified site   . Osteoporosis, unspecified   . Other and unspecified hyperlipidemia   . Other dyspnea and respiratory abnormality   . Oxygen dependent   . Peripheral vascular disease, unspecified ([place])   . Personal history of malignant neoplasm of breast   . Pneumonia   . Recurrent upper respiratory infection (URI)   . Shortness of breath   . Spinal stenosis   . Unspecified cardiovascular disease   . Unspecified essential hypertension   . Unspecified hearing loss   . Unspecified urinary incontinence     Past Surgical History:  Procedure Laterality Date  . ABDOMINAL HYSTERECTOMY    . APPENDECTOMY  1948  . BREAST IMPLANT REMOVAL  06/12/09   right  . BREAST RECONSTRUCTION  1998   Reconstruction   . CARDIAC CATHETERIZATION N/A 01/25/2015   Procedure: Left Heart Cath and Coronary Angiography;  Surgeon: Charolette Forward, MD;  Location: [place] [facility];  Service: Cardiovascular;  Laterality: N/A;  . CORONARY ANGIOPLASTY  11/12   distal RCA  . FLEXIBLE SIGMOIDOSCOPY N/A 01/21/2015   Procedure: FLEXIBLE SIGMOIDOSCOPY;  Surgeon: Richmond Campbell, MD;  Location: [facility] ENDOSCOPY;  Service: Endoscopy;  Laterality: N/A;  . FLEXIBLE SIGMOIDOSCOPY  01/22/2015      . FRACTURE SURGERY     fracture right elbow  . LEFT HEART CATH AND CORONARY ANGIOGRAPHY N/A 11/14/2016    Procedure: Left Heart Cath and Coronary Angiography;  Surgeon: Charolette Forward, MD;  Location: [place] CV LAB;  Service: Cardiovascular;  Laterality: N/A;  . LEFT HEART CATHETERIZATION WITH CORONARY ANGIOGRAM N/A 06/05/2011   Procedure: LEFT HEART CATHETERIZATION WITH CORONARY ANGIOGRAM;  Surgeon: Clent Demark, MD;  Location: [place] CATH LAB;  Service: Cardiovascular;  Laterality: N/A;  . LEFT HEART CATHETERIZATION WITH CORONARY ANGIOGRAM N/A 03/05/2014   Procedure: LEFT HEART CATHETERIZATION WITH CORONARY ANGIOGRAM;  Surgeon:  Clent Demark, MD;  Location: [place] CATH LAB;  Service: Cardiovascular;  Laterality: N/A;  . LUMBAR LAMINECTOMY  2003  . MASTECTOMY  1987   Right  . MASTECTOMY    . MASTOIDECTOMY  childhood  . REDUCTION MAMMAPLASTY  1998   Left  . SHOULDER SURGERY  02/2005   Bilateral fractures with multiple surgeries    Family History  Problem Relation Age of Onset  . Heart attack Father 43  . Cancer Mother        ovarian  . Cancer Brother        lung  . Arthritis Unknown        family    Social History   Social History  . Marital status: Widowed    Spouse name: N/A  . Number of children: 2  . Years of education: N/A   Occupational History  . 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  [employer]   Social History Main Topics  . Smoking status: Former Smoker    Quit date: 07/13/1974  . Smokeless tobacco: Never Used  . Alcohol use 0.0 oz/week     Comment: occasional  . Drug use: No  . Sexual activity: No   Other Topics Concern  . Not on file   Social History Narrative   Has living will   Son or daughter would be health care POA.   Requests DNR--written 03/02/13   No tube feeds if cognitively unaware   Review of Systems No fever Some cough--just a little. Tickle cough Lots of rhinorrhea -- clear Sleeps in bed--1 pillow. No PND     Objective:   Physical Exam  Constitutional: She appears well-nourished. No distress.  Neck: No thyromegaly  present.  Cardiovascular: Normal rate, regular rhythm and normal heart sounds.  Exam reveals no gallop.   No murmur heard. Pulmonary/Chest: Effort normal and breath sounds normal. No respiratory distress. She has no wheezes. She has no rales.  Musculoskeletal: She exhibits no edema.  Lymphadenopathy:    She has no cervical adenopathy.          Assessment & Plan:

## 2017-01-21 NOTE — Addendum Note (Signed)
Addended by: Pilar Grammes on: 01/21/2017 03:46 PM   Modules accepted: Orders

## 2017-01-21 NOTE — Assessment & Plan Note (Addendum)
Unclear reason Could be anginal equivalent but would be microvascular problem since recent cath okay CHF?? Weight is stable and no clear exacerbation otherwise Could be hypoxic respiratory failure No signs of infection  CXR shows some scarring but no active pneumonia CHF much better---will await overread  Likely  Hypoxia--mostly related to COPD sats went down to 88% quickly---and she has known CHF Went lower with PT Will add oxygen--for bedtime and all activity If ongoing symptoms, back to cardiologist

## 2017-01-22 DIAGNOSIS — R2681 Unsteadiness on feet: Secondary | ICD-10-CM | POA: Diagnosis not present

## 2017-01-22 DIAGNOSIS — I110 Hypertensive heart disease with heart failure: Secondary | ICD-10-CM | POA: Diagnosis not present

## 2017-01-22 DIAGNOSIS — I5020 Unspecified systolic (congestive) heart failure: Secondary | ICD-10-CM | POA: Diagnosis not present

## 2017-01-22 DIAGNOSIS — J449 Chronic obstructive pulmonary disease, unspecified: Secondary | ICD-10-CM | POA: Diagnosis not present

## 2017-01-22 DIAGNOSIS — I2510 Atherosclerotic heart disease of native coronary artery without angina pectoris: Secondary | ICD-10-CM | POA: Diagnosis not present

## 2017-01-22 DIAGNOSIS — I5042 Chronic combined systolic (congestive) and diastolic (congestive) heart failure: Secondary | ICD-10-CM | POA: Diagnosis not present

## 2017-01-25 DIAGNOSIS — J449 Chronic obstructive pulmonary disease, unspecified: Secondary | ICD-10-CM | POA: Diagnosis not present

## 2017-01-25 DIAGNOSIS — R2681 Unsteadiness on feet: Secondary | ICD-10-CM | POA: Diagnosis not present

## 2017-01-25 DIAGNOSIS — I5042 Chronic combined systolic (congestive) and diastolic (congestive) heart failure: Secondary | ICD-10-CM | POA: Diagnosis not present

## 2017-01-25 DIAGNOSIS — I2510 Atherosclerotic heart disease of native coronary artery without angina pectoris: Secondary | ICD-10-CM | POA: Diagnosis not present

## 2017-01-25 DIAGNOSIS — I110 Hypertensive heart disease with heart failure: Secondary | ICD-10-CM | POA: Diagnosis not present

## 2017-01-26 ENCOUNTER — Telehealth: Payer: Self-pay | Admitting: Internal Medicine

## 2017-01-26 NOTE — Telephone Encounter (Signed)
Spoke to [place]. Advised her that I have sent a message to [facility] from [facility] to get clarity on availability of oxygen.

## 2017-01-26 NOTE — Telephone Encounter (Signed)
PT daughter, Marval Regal called concerning O2 tanks. She is concerned about the size and weight of tanks and current service, [facility], does not have smaller, more portable tanks. Someone recommended [facility]? She is requesting to cb to discuss another [place] O2 provider.

## 2017-01-27 DIAGNOSIS — J449 Chronic obstructive pulmonary disease, unspecified: Secondary | ICD-10-CM | POA: Diagnosis not present

## 2017-01-27 DIAGNOSIS — I2510 Atherosclerotic heart disease of native coronary artery without angina pectoris: Secondary | ICD-10-CM | POA: Diagnosis not present

## 2017-01-27 DIAGNOSIS — R2681 Unsteadiness on feet: Secondary | ICD-10-CM | POA: Diagnosis not present

## 2017-01-27 DIAGNOSIS — I110 Hypertensive heart disease with heart failure: Secondary | ICD-10-CM | POA: Diagnosis not present

## 2017-01-27 DIAGNOSIS — I5042 Chronic combined systolic (congestive) and diastolic (congestive) heart failure: Secondary | ICD-10-CM | POA: Diagnosis not present

## 2017-01-27 NOTE — Telephone Encounter (Signed)
Haley Harris from [facility] sent me a message back stating "We have already placed a follow up with our RT Dpt to see if she can handle a portable concentrator. She has an appt at 7/23." I called and spoke to [place] to let her know.

## 2017-01-28 DIAGNOSIS — I110 Hypertensive heart disease with heart failure: Secondary | ICD-10-CM | POA: Diagnosis not present

## 2017-01-28 DIAGNOSIS — R2681 Unsteadiness on feet: Secondary | ICD-10-CM | POA: Diagnosis not present

## 2017-01-28 DIAGNOSIS — I2510 Atherosclerotic heart disease of native coronary artery without angina pectoris: Secondary | ICD-10-CM | POA: Diagnosis not present

## 2017-01-28 DIAGNOSIS — I5042 Chronic combined systolic (congestive) and diastolic (congestive) heart failure: Secondary | ICD-10-CM | POA: Diagnosis not present

## 2017-01-28 DIAGNOSIS — J449 Chronic obstructive pulmonary disease, unspecified: Secondary | ICD-10-CM | POA: Diagnosis not present

## 2017-01-29 DIAGNOSIS — I5042 Chronic combined systolic (congestive) and diastolic (congestive) heart failure: Secondary | ICD-10-CM | POA: Diagnosis not present

## 2017-01-29 DIAGNOSIS — I2510 Atherosclerotic heart disease of native coronary artery without angina pectoris: Secondary | ICD-10-CM | POA: Diagnosis not present

## 2017-01-29 DIAGNOSIS — J449 Chronic obstructive pulmonary disease, unspecified: Secondary | ICD-10-CM | POA: Diagnosis not present

## 2017-01-29 DIAGNOSIS — R2681 Unsteadiness on feet: Secondary | ICD-10-CM | POA: Diagnosis not present

## 2017-01-29 DIAGNOSIS — I110 Hypertensive heart disease with heart failure: Secondary | ICD-10-CM | POA: Diagnosis not present

## 2017-02-01 DIAGNOSIS — J449 Chronic obstructive pulmonary disease, unspecified: Secondary | ICD-10-CM | POA: Diagnosis not present

## 2017-02-01 DIAGNOSIS — I110 Hypertensive heart disease with heart failure: Secondary | ICD-10-CM | POA: Diagnosis not present

## 2017-02-01 DIAGNOSIS — I2510 Atherosclerotic heart disease of native coronary artery without angina pectoris: Secondary | ICD-10-CM | POA: Diagnosis not present

## 2017-02-01 DIAGNOSIS — R2681 Unsteadiness on feet: Secondary | ICD-10-CM | POA: Diagnosis not present

## 2017-02-01 DIAGNOSIS — I5042 Chronic combined systolic (congestive) and diastolic (congestive) heart failure: Secondary | ICD-10-CM | POA: Diagnosis not present

## 2017-02-03 DIAGNOSIS — I5042 Chronic combined systolic (congestive) and diastolic (congestive) heart failure: Secondary | ICD-10-CM | POA: Diagnosis not present

## 2017-02-03 DIAGNOSIS — I2510 Atherosclerotic heart disease of native coronary artery without angina pectoris: Secondary | ICD-10-CM | POA: Diagnosis not present

## 2017-02-03 DIAGNOSIS — R2681 Unsteadiness on feet: Secondary | ICD-10-CM | POA: Diagnosis not present

## 2017-02-03 DIAGNOSIS — I110 Hypertensive heart disease with heart failure: Secondary | ICD-10-CM | POA: Diagnosis not present

## 2017-02-03 DIAGNOSIS — J449 Chronic obstructive pulmonary disease, unspecified: Secondary | ICD-10-CM | POA: Diagnosis not present

## 2017-02-04 DIAGNOSIS — J449 Chronic obstructive pulmonary disease, unspecified: Secondary | ICD-10-CM | POA: Diagnosis not present

## 2017-02-04 DIAGNOSIS — R2681 Unsteadiness on feet: Secondary | ICD-10-CM | POA: Diagnosis not present

## 2017-02-04 DIAGNOSIS — I110 Hypertensive heart disease with heart failure: Secondary | ICD-10-CM | POA: Diagnosis not present

## 2017-02-04 DIAGNOSIS — I5042 Chronic combined systolic (congestive) and diastolic (congestive) heart failure: Secondary | ICD-10-CM | POA: Diagnosis not present

## 2017-02-04 DIAGNOSIS — I2510 Atherosclerotic heart disease of native coronary artery without angina pectoris: Secondary | ICD-10-CM | POA: Diagnosis not present

## 2017-02-05 ENCOUNTER — Telehealth: Payer: Self-pay

## 2017-02-05 NOTE — Telephone Encounter (Signed)
Erin PT with [facility] left v/m requesting verbal orders HH PT for 2 x a week for 4 weeks;pt doing well with PT but with pt recent addition of O2 for activities pt is having to navigate and ambulation safety.

## 2017-02-05 NOTE — Telephone Encounter (Signed)
Haley Harris was notified.  

## 2017-02-05 NOTE — Telephone Encounter (Signed)
Agree. Thanks

## 2017-02-09 DIAGNOSIS — I739 Peripheral vascular disease, unspecified: Secondary | ICD-10-CM | POA: Diagnosis not present

## 2017-02-09 DIAGNOSIS — R2681 Unsteadiness on feet: Secondary | ICD-10-CM | POA: Diagnosis not present

## 2017-02-09 DIAGNOSIS — I2510 Atherosclerotic heart disease of native coronary artery without angina pectoris: Secondary | ICD-10-CM | POA: Diagnosis not present

## 2017-02-09 DIAGNOSIS — M1991 Primary osteoarthritis, unspecified site: Secondary | ICD-10-CM | POA: Diagnosis not present

## 2017-02-09 DIAGNOSIS — Z853 Personal history of malignant neoplasm of breast: Secondary | ICD-10-CM | POA: Diagnosis not present

## 2017-02-09 DIAGNOSIS — Z9011 Acquired absence of right breast and nipple: Secondary | ICD-10-CM | POA: Diagnosis not present

## 2017-02-09 DIAGNOSIS — G629 Polyneuropathy, unspecified: Secondary | ICD-10-CM | POA: Diagnosis not present

## 2017-02-09 DIAGNOSIS — I110 Hypertensive heart disease with heart failure: Secondary | ICD-10-CM | POA: Diagnosis not present

## 2017-02-09 DIAGNOSIS — J449 Chronic obstructive pulmonary disease, unspecified: Secondary | ICD-10-CM | POA: Diagnosis not present

## 2017-02-09 DIAGNOSIS — I5042 Chronic combined systolic (congestive) and diastolic (congestive) heart failure: Secondary | ICD-10-CM | POA: Diagnosis not present

## 2017-02-09 DIAGNOSIS — E785 Hyperlipidemia, unspecified: Secondary | ICD-10-CM | POA: Diagnosis not present

## 2017-02-10 DIAGNOSIS — G629 Polyneuropathy, unspecified: Secondary | ICD-10-CM | POA: Diagnosis not present

## 2017-02-10 DIAGNOSIS — I739 Peripheral vascular disease, unspecified: Secondary | ICD-10-CM | POA: Diagnosis not present

## 2017-02-10 DIAGNOSIS — Z853 Personal history of malignant neoplasm of breast: Secondary | ICD-10-CM | POA: Diagnosis not present

## 2017-02-10 DIAGNOSIS — I2510 Atherosclerotic heart disease of native coronary artery without angina pectoris: Secondary | ICD-10-CM | POA: Diagnosis not present

## 2017-02-10 DIAGNOSIS — Z9011 Acquired absence of right breast and nipple: Secondary | ICD-10-CM | POA: Diagnosis not present

## 2017-02-10 DIAGNOSIS — J449 Chronic obstructive pulmonary disease, unspecified: Secondary | ICD-10-CM | POA: Diagnosis not present

## 2017-02-10 DIAGNOSIS — M1991 Primary osteoarthritis, unspecified site: Secondary | ICD-10-CM | POA: Diagnosis not present

## 2017-02-10 DIAGNOSIS — E785 Hyperlipidemia, unspecified: Secondary | ICD-10-CM | POA: Diagnosis not present

## 2017-02-10 DIAGNOSIS — I5042 Chronic combined systolic (congestive) and diastolic (congestive) heart failure: Secondary | ICD-10-CM | POA: Diagnosis not present

## 2017-02-10 DIAGNOSIS — I110 Hypertensive heart disease with heart failure: Secondary | ICD-10-CM | POA: Diagnosis not present

## 2017-02-10 DIAGNOSIS — R2681 Unsteadiness on feet: Secondary | ICD-10-CM | POA: Diagnosis not present

## 2017-02-11 DIAGNOSIS — M1991 Primary osteoarthritis, unspecified site: Secondary | ICD-10-CM | POA: Diagnosis not present

## 2017-02-11 DIAGNOSIS — J449 Chronic obstructive pulmonary disease, unspecified: Secondary | ICD-10-CM | POA: Diagnosis not present

## 2017-02-11 DIAGNOSIS — I2510 Atherosclerotic heart disease of native coronary artery without angina pectoris: Secondary | ICD-10-CM | POA: Diagnosis not present

## 2017-02-11 DIAGNOSIS — I110 Hypertensive heart disease with heart failure: Secondary | ICD-10-CM | POA: Diagnosis not present

## 2017-02-11 DIAGNOSIS — E785 Hyperlipidemia, unspecified: Secondary | ICD-10-CM | POA: Diagnosis not present

## 2017-02-11 DIAGNOSIS — I739 Peripheral vascular disease, unspecified: Secondary | ICD-10-CM | POA: Diagnosis not present

## 2017-02-11 DIAGNOSIS — I5042 Chronic combined systolic (congestive) and diastolic (congestive) heart failure: Secondary | ICD-10-CM | POA: Diagnosis not present

## 2017-02-11 DIAGNOSIS — R2681 Unsteadiness on feet: Secondary | ICD-10-CM | POA: Diagnosis not present

## 2017-02-11 DIAGNOSIS — Z853 Personal history of malignant neoplasm of breast: Secondary | ICD-10-CM | POA: Diagnosis not present

## 2017-02-11 DIAGNOSIS — G629 Polyneuropathy, unspecified: Secondary | ICD-10-CM | POA: Diagnosis not present

## 2017-02-11 DIAGNOSIS — Z9011 Acquired absence of right breast and nipple: Secondary | ICD-10-CM | POA: Diagnosis not present

## 2017-02-12 DIAGNOSIS — I110 Hypertensive heart disease with heart failure: Secondary | ICD-10-CM | POA: Diagnosis not present

## 2017-02-12 DIAGNOSIS — Z4431 Encounter for fitting and adjustment of external right breast prosthesis: Secondary | ICD-10-CM | POA: Diagnosis not present

## 2017-02-12 DIAGNOSIS — I5042 Chronic combined systolic (congestive) and diastolic (congestive) heart failure: Secondary | ICD-10-CM | POA: Diagnosis not present

## 2017-02-12 DIAGNOSIS — I739 Peripheral vascular disease, unspecified: Secondary | ICD-10-CM | POA: Diagnosis not present

## 2017-02-12 DIAGNOSIS — E785 Hyperlipidemia, unspecified: Secondary | ICD-10-CM | POA: Diagnosis not present

## 2017-02-12 DIAGNOSIS — M1991 Primary osteoarthritis, unspecified site: Secondary | ICD-10-CM | POA: Diagnosis not present

## 2017-02-12 DIAGNOSIS — C50111 Malignant neoplasm of central portion of right female breast: Secondary | ICD-10-CM | POA: Diagnosis not present

## 2017-02-12 DIAGNOSIS — Z9011 Acquired absence of right breast and nipple: Secondary | ICD-10-CM | POA: Diagnosis not present

## 2017-02-12 DIAGNOSIS — Z853 Personal history of malignant neoplasm of breast: Secondary | ICD-10-CM | POA: Diagnosis not present

## 2017-02-12 DIAGNOSIS — G629 Polyneuropathy, unspecified: Secondary | ICD-10-CM | POA: Diagnosis not present

## 2017-02-12 DIAGNOSIS — I2510 Atherosclerotic heart disease of native coronary artery without angina pectoris: Secondary | ICD-10-CM | POA: Diagnosis not present

## 2017-02-12 DIAGNOSIS — R2681 Unsteadiness on feet: Secondary | ICD-10-CM | POA: Diagnosis not present

## 2017-02-12 DIAGNOSIS — J449 Chronic obstructive pulmonary disease, unspecified: Secondary | ICD-10-CM | POA: Diagnosis not present

## 2017-02-12 NOTE — Telephone Encounter (Signed)
In reviewing the prolia book, this pt did not receive injection. Please contact pt to see if she is still interested in receiving osteoporosis Tx. Her current benefits will expire in Oct and will have to be re-verified.

## 2017-02-15 ENCOUNTER — Telehealth: Payer: Self-pay | Admitting: Internal Medicine

## 2017-02-15 DIAGNOSIS — E785 Hyperlipidemia, unspecified: Secondary | ICD-10-CM | POA: Diagnosis not present

## 2017-02-15 DIAGNOSIS — Z9011 Acquired absence of right breast and nipple: Secondary | ICD-10-CM | POA: Diagnosis not present

## 2017-02-15 DIAGNOSIS — I5042 Chronic combined systolic (congestive) and diastolic (congestive) heart failure: Secondary | ICD-10-CM | POA: Diagnosis not present

## 2017-02-15 DIAGNOSIS — I110 Hypertensive heart disease with heart failure: Secondary | ICD-10-CM | POA: Diagnosis not present

## 2017-02-15 DIAGNOSIS — M1991 Primary osteoarthritis, unspecified site: Secondary | ICD-10-CM | POA: Diagnosis not present

## 2017-02-15 DIAGNOSIS — I739 Peripheral vascular disease, unspecified: Secondary | ICD-10-CM | POA: Diagnosis not present

## 2017-02-15 DIAGNOSIS — R2681 Unsteadiness on feet: Secondary | ICD-10-CM | POA: Diagnosis not present

## 2017-02-15 DIAGNOSIS — Z853 Personal history of malignant neoplasm of breast: Secondary | ICD-10-CM | POA: Diagnosis not present

## 2017-02-15 DIAGNOSIS — J449 Chronic obstructive pulmonary disease, unspecified: Secondary | ICD-10-CM | POA: Diagnosis not present

## 2017-02-15 DIAGNOSIS — I2510 Atherosclerotic heart disease of native coronary artery without angina pectoris: Secondary | ICD-10-CM | POA: Diagnosis not present

## 2017-02-15 DIAGNOSIS — G629 Polyneuropathy, unspecified: Secondary | ICD-10-CM | POA: Diagnosis not present

## 2017-02-15 NOTE — Telephone Encounter (Signed)
[name] Name: Meli Faley  Relationship to [place]:  Reason for call: returning your call

## 2017-02-15 NOTE — Telephone Encounter (Signed)
Left a message for pt to call back

## 2017-02-15 NOTE — Telephone Encounter (Signed)
See previous open telephone note.

## 2017-02-16 DIAGNOSIS — M1991 Primary osteoarthritis, unspecified site: Secondary | ICD-10-CM | POA: Diagnosis not present

## 2017-02-16 DIAGNOSIS — Z853 Personal history of malignant neoplasm of breast: Secondary | ICD-10-CM | POA: Diagnosis not present

## 2017-02-16 DIAGNOSIS — G629 Polyneuropathy, unspecified: Secondary | ICD-10-CM | POA: Diagnosis not present

## 2017-02-16 DIAGNOSIS — J449 Chronic obstructive pulmonary disease, unspecified: Secondary | ICD-10-CM | POA: Diagnosis not present

## 2017-02-16 DIAGNOSIS — I739 Peripheral vascular disease, unspecified: Secondary | ICD-10-CM | POA: Diagnosis not present

## 2017-02-16 DIAGNOSIS — I2510 Atherosclerotic heart disease of native coronary artery without angina pectoris: Secondary | ICD-10-CM | POA: Diagnosis not present

## 2017-02-16 DIAGNOSIS — I110 Hypertensive heart disease with heart failure: Secondary | ICD-10-CM | POA: Diagnosis not present

## 2017-02-16 DIAGNOSIS — R2681 Unsteadiness on feet: Secondary | ICD-10-CM | POA: Diagnosis not present

## 2017-02-16 DIAGNOSIS — E785 Hyperlipidemia, unspecified: Secondary | ICD-10-CM | POA: Diagnosis not present

## 2017-02-16 DIAGNOSIS — I5042 Chronic combined systolic (congestive) and diastolic (congestive) heart failure: Secondary | ICD-10-CM | POA: Diagnosis not present

## 2017-02-16 DIAGNOSIS — Z9011 Acquired absence of right breast and nipple: Secondary | ICD-10-CM | POA: Diagnosis not present

## 2017-02-16 NOTE — Telephone Encounter (Signed)
Spoke to pt. She said unless there is a discount card or something she could get to lower the cost, she cannot afford the Prolia.

## 2017-02-17 NOTE — Telephone Encounter (Signed)
She has been getting it for some time--so it is reasonable to take a break. If she develops a new fracture, I would probably consider another course of fosamax or actonel (if she can tolerate) but nothing for now

## 2017-02-17 NOTE — Telephone Encounter (Signed)
Left detailed message on vm per dpr 

## 2017-02-19 DIAGNOSIS — G629 Polyneuropathy, unspecified: Secondary | ICD-10-CM | POA: Diagnosis not present

## 2017-02-19 DIAGNOSIS — R2681 Unsteadiness on feet: Secondary | ICD-10-CM | POA: Diagnosis not present

## 2017-02-19 DIAGNOSIS — E785 Hyperlipidemia, unspecified: Secondary | ICD-10-CM | POA: Diagnosis not present

## 2017-02-19 DIAGNOSIS — M1991 Primary osteoarthritis, unspecified site: Secondary | ICD-10-CM | POA: Diagnosis not present

## 2017-02-19 DIAGNOSIS — I2510 Atherosclerotic heart disease of native coronary artery without angina pectoris: Secondary | ICD-10-CM | POA: Diagnosis not present

## 2017-02-19 DIAGNOSIS — I110 Hypertensive heart disease with heart failure: Secondary | ICD-10-CM | POA: Diagnosis not present

## 2017-02-19 DIAGNOSIS — J449 Chronic obstructive pulmonary disease, unspecified: Secondary | ICD-10-CM | POA: Diagnosis not present

## 2017-02-19 DIAGNOSIS — I739 Peripheral vascular disease, unspecified: Secondary | ICD-10-CM | POA: Diagnosis not present

## 2017-02-19 DIAGNOSIS — Z853 Personal history of malignant neoplasm of breast: Secondary | ICD-10-CM | POA: Diagnosis not present

## 2017-02-19 DIAGNOSIS — I5042 Chronic combined systolic (congestive) and diastolic (congestive) heart failure: Secondary | ICD-10-CM | POA: Diagnosis not present

## 2017-02-19 DIAGNOSIS — Z9011 Acquired absence of right breast and nipple: Secondary | ICD-10-CM | POA: Diagnosis not present

## 2017-02-22 DIAGNOSIS — I739 Peripheral vascular disease, unspecified: Secondary | ICD-10-CM | POA: Diagnosis not present

## 2017-02-22 DIAGNOSIS — E785 Hyperlipidemia, unspecified: Secondary | ICD-10-CM | POA: Diagnosis not present

## 2017-02-22 DIAGNOSIS — I5042 Chronic combined systolic (congestive) and diastolic (congestive) heart failure: Secondary | ICD-10-CM | POA: Diagnosis not present

## 2017-02-22 DIAGNOSIS — R2681 Unsteadiness on feet: Secondary | ICD-10-CM | POA: Diagnosis not present

## 2017-02-22 DIAGNOSIS — G629 Polyneuropathy, unspecified: Secondary | ICD-10-CM | POA: Diagnosis not present

## 2017-02-22 DIAGNOSIS — I2510 Atherosclerotic heart disease of native coronary artery without angina pectoris: Secondary | ICD-10-CM | POA: Diagnosis not present

## 2017-02-22 DIAGNOSIS — I5020 Unspecified systolic (congestive) heart failure: Secondary | ICD-10-CM | POA: Diagnosis not present

## 2017-02-22 DIAGNOSIS — Z9011 Acquired absence of right breast and nipple: Secondary | ICD-10-CM | POA: Diagnosis not present

## 2017-02-22 DIAGNOSIS — Z853 Personal history of malignant neoplasm of breast: Secondary | ICD-10-CM | POA: Diagnosis not present

## 2017-02-22 DIAGNOSIS — M1991 Primary osteoarthritis, unspecified site: Secondary | ICD-10-CM | POA: Diagnosis not present

## 2017-02-22 DIAGNOSIS — J449 Chronic obstructive pulmonary disease, unspecified: Secondary | ICD-10-CM | POA: Diagnosis not present

## 2017-02-22 DIAGNOSIS — I110 Hypertensive heart disease with heart failure: Secondary | ICD-10-CM | POA: Diagnosis not present

## 2017-02-24 DIAGNOSIS — Z9011 Acquired absence of right breast and nipple: Secondary | ICD-10-CM | POA: Diagnosis not present

## 2017-02-24 DIAGNOSIS — E785 Hyperlipidemia, unspecified: Secondary | ICD-10-CM | POA: Diagnosis not present

## 2017-02-24 DIAGNOSIS — R2681 Unsteadiness on feet: Secondary | ICD-10-CM | POA: Diagnosis not present

## 2017-02-24 DIAGNOSIS — I739 Peripheral vascular disease, unspecified: Secondary | ICD-10-CM | POA: Diagnosis not present

## 2017-02-24 DIAGNOSIS — I2510 Atherosclerotic heart disease of native coronary artery without angina pectoris: Secondary | ICD-10-CM | POA: Diagnosis not present

## 2017-02-24 DIAGNOSIS — J449 Chronic obstructive pulmonary disease, unspecified: Secondary | ICD-10-CM | POA: Diagnosis not present

## 2017-02-24 DIAGNOSIS — G629 Polyneuropathy, unspecified: Secondary | ICD-10-CM | POA: Diagnosis not present

## 2017-02-24 DIAGNOSIS — Z853 Personal history of malignant neoplasm of breast: Secondary | ICD-10-CM | POA: Diagnosis not present

## 2017-02-24 DIAGNOSIS — I110 Hypertensive heart disease with heart failure: Secondary | ICD-10-CM | POA: Diagnosis not present

## 2017-02-24 DIAGNOSIS — M1991 Primary osteoarthritis, unspecified site: Secondary | ICD-10-CM | POA: Diagnosis not present

## 2017-02-24 DIAGNOSIS — I5042 Chronic combined systolic (congestive) and diastolic (congestive) heart failure: Secondary | ICD-10-CM | POA: Diagnosis not present

## 2017-03-01 DIAGNOSIS — I2510 Atherosclerotic heart disease of native coronary artery without angina pectoris: Secondary | ICD-10-CM | POA: Diagnosis not present

## 2017-03-01 DIAGNOSIS — G629 Polyneuropathy, unspecified: Secondary | ICD-10-CM | POA: Diagnosis not present

## 2017-03-01 DIAGNOSIS — M1991 Primary osteoarthritis, unspecified site: Secondary | ICD-10-CM | POA: Diagnosis not present

## 2017-03-01 DIAGNOSIS — I110 Hypertensive heart disease with heart failure: Secondary | ICD-10-CM | POA: Diagnosis not present

## 2017-03-01 DIAGNOSIS — I739 Peripheral vascular disease, unspecified: Secondary | ICD-10-CM | POA: Diagnosis not present

## 2017-03-01 DIAGNOSIS — I5042 Chronic combined systolic (congestive) and diastolic (congestive) heart failure: Secondary | ICD-10-CM | POA: Diagnosis not present

## 2017-03-01 DIAGNOSIS — J449 Chronic obstructive pulmonary disease, unspecified: Secondary | ICD-10-CM | POA: Diagnosis not present

## 2017-03-01 DIAGNOSIS — E785 Hyperlipidemia, unspecified: Secondary | ICD-10-CM | POA: Diagnosis not present

## 2017-03-01 DIAGNOSIS — Z9011 Acquired absence of right breast and nipple: Secondary | ICD-10-CM | POA: Diagnosis not present

## 2017-03-01 DIAGNOSIS — Z853 Personal history of malignant neoplasm of breast: Secondary | ICD-10-CM | POA: Diagnosis not present

## 2017-03-01 DIAGNOSIS — R2681 Unsteadiness on feet: Secondary | ICD-10-CM | POA: Diagnosis not present

## 2017-03-02 DIAGNOSIS — G629 Polyneuropathy, unspecified: Secondary | ICD-10-CM | POA: Diagnosis not present

## 2017-03-02 DIAGNOSIS — M1991 Primary osteoarthritis, unspecified site: Secondary | ICD-10-CM | POA: Diagnosis not present

## 2017-03-02 DIAGNOSIS — Z853 Personal history of malignant neoplasm of breast: Secondary | ICD-10-CM | POA: Diagnosis not present

## 2017-03-02 DIAGNOSIS — I5042 Chronic combined systolic (congestive) and diastolic (congestive) heart failure: Secondary | ICD-10-CM | POA: Diagnosis not present

## 2017-03-02 DIAGNOSIS — I110 Hypertensive heart disease with heart failure: Secondary | ICD-10-CM | POA: Diagnosis not present

## 2017-03-02 DIAGNOSIS — Z9011 Acquired absence of right breast and nipple: Secondary | ICD-10-CM | POA: Diagnosis not present

## 2017-03-02 DIAGNOSIS — I2510 Atherosclerotic heart disease of native coronary artery without angina pectoris: Secondary | ICD-10-CM | POA: Diagnosis not present

## 2017-03-02 DIAGNOSIS — R2681 Unsteadiness on feet: Secondary | ICD-10-CM | POA: Diagnosis not present

## 2017-03-02 DIAGNOSIS — E785 Hyperlipidemia, unspecified: Secondary | ICD-10-CM | POA: Diagnosis not present

## 2017-03-02 DIAGNOSIS — I739 Peripheral vascular disease, unspecified: Secondary | ICD-10-CM | POA: Diagnosis not present

## 2017-03-02 DIAGNOSIS — J449 Chronic obstructive pulmonary disease, unspecified: Secondary | ICD-10-CM | POA: Diagnosis not present

## 2017-03-03 DIAGNOSIS — I110 Hypertensive heart disease with heart failure: Secondary | ICD-10-CM | POA: Diagnosis not present

## 2017-03-03 DIAGNOSIS — G629 Polyneuropathy, unspecified: Secondary | ICD-10-CM | POA: Diagnosis not present

## 2017-03-03 DIAGNOSIS — M1991 Primary osteoarthritis, unspecified site: Secondary | ICD-10-CM | POA: Diagnosis not present

## 2017-03-03 DIAGNOSIS — Z853 Personal history of malignant neoplasm of breast: Secondary | ICD-10-CM | POA: Diagnosis not present

## 2017-03-03 DIAGNOSIS — J449 Chronic obstructive pulmonary disease, unspecified: Secondary | ICD-10-CM | POA: Diagnosis not present

## 2017-03-03 DIAGNOSIS — Z9011 Acquired absence of right breast and nipple: Secondary | ICD-10-CM | POA: Diagnosis not present

## 2017-03-03 DIAGNOSIS — E785 Hyperlipidemia, unspecified: Secondary | ICD-10-CM | POA: Diagnosis not present

## 2017-03-03 DIAGNOSIS — R2681 Unsteadiness on feet: Secondary | ICD-10-CM | POA: Diagnosis not present

## 2017-03-03 DIAGNOSIS — I2510 Atherosclerotic heart disease of native coronary artery without angina pectoris: Secondary | ICD-10-CM | POA: Diagnosis not present

## 2017-03-03 DIAGNOSIS — I739 Peripheral vascular disease, unspecified: Secondary | ICD-10-CM | POA: Diagnosis not present

## 2017-03-03 DIAGNOSIS — I5042 Chronic combined systolic (congestive) and diastolic (congestive) heart failure: Secondary | ICD-10-CM | POA: Diagnosis not present

## 2017-03-04 DIAGNOSIS — J449 Chronic obstructive pulmonary disease, unspecified: Secondary | ICD-10-CM | POA: Diagnosis not present

## 2017-03-04 DIAGNOSIS — I110 Hypertensive heart disease with heart failure: Secondary | ICD-10-CM | POA: Diagnosis not present

## 2017-03-04 DIAGNOSIS — E785 Hyperlipidemia, unspecified: Secondary | ICD-10-CM | POA: Diagnosis not present

## 2017-03-04 DIAGNOSIS — I5042 Chronic combined systolic (congestive) and diastolic (congestive) heart failure: Secondary | ICD-10-CM | POA: Diagnosis not present

## 2017-03-04 DIAGNOSIS — Z9011 Acquired absence of right breast and nipple: Secondary | ICD-10-CM | POA: Diagnosis not present

## 2017-03-04 DIAGNOSIS — R2681 Unsteadiness on feet: Secondary | ICD-10-CM | POA: Diagnosis not present

## 2017-03-04 DIAGNOSIS — Z853 Personal history of malignant neoplasm of breast: Secondary | ICD-10-CM | POA: Diagnosis not present

## 2017-03-04 DIAGNOSIS — G629 Polyneuropathy, unspecified: Secondary | ICD-10-CM | POA: Diagnosis not present

## 2017-03-04 DIAGNOSIS — M1991 Primary osteoarthritis, unspecified site: Secondary | ICD-10-CM | POA: Diagnosis not present

## 2017-03-04 DIAGNOSIS — I739 Peripheral vascular disease, unspecified: Secondary | ICD-10-CM | POA: Diagnosis not present

## 2017-03-04 DIAGNOSIS — I2510 Atherosclerotic heart disease of native coronary artery without angina pectoris: Secondary | ICD-10-CM | POA: Diagnosis not present

## 2017-03-09 ENCOUNTER — Ambulatory Visit (INDEPENDENT_AMBULATORY_CARE_PROVIDER_SITE_OTHER): Payer: Medicare Other | Admitting: Internal Medicine

## 2017-03-09 ENCOUNTER — Encounter: Payer: Self-pay | Admitting: Internal Medicine

## 2017-03-09 ENCOUNTER — Other Ambulatory Visit: Payer: Self-pay | Admitting: Internal Medicine

## 2017-03-09 VITALS — BP 118/80 | HR 76 | Temp 98.0°F | Wt 182.1 lb

## 2017-03-09 DIAGNOSIS — J9611 Chronic respiratory failure with hypoxia: Secondary | ICD-10-CM | POA: Insufficient documentation

## 2017-03-09 DIAGNOSIS — F39 Unspecified mood [affective] disorder: Secondary | ICD-10-CM | POA: Diagnosis not present

## 2017-03-09 DIAGNOSIS — J449 Chronic obstructive pulmonary disease, unspecified: Secondary | ICD-10-CM | POA: Diagnosis not present

## 2017-03-09 DIAGNOSIS — I25119 Atherosclerotic heart disease of native coronary artery with unspecified angina pectoris: Secondary | ICD-10-CM

## 2017-03-09 DIAGNOSIS — I5042 Chronic combined systolic (congestive) and diastolic (congestive) heart failure: Secondary | ICD-10-CM

## 2017-03-09 MED ORDER — NITROGLYCERIN 0.4 MG SL SUBL: 0.4000 mg | SUBLINGUAL_TABLET | SUBLINGUAL | 2 refills | Status: AC | PRN

## 2017-03-09 MED ORDER — ALPRAZOLAM 0.25 MG PO TABS: 0.1250 mg | ORAL_TABLET | Freq: Two times a day (BID) | ORAL | 0 refills | Status: DC | PRN

## 2017-03-09 NOTE — Assessment & Plan Note (Signed)
Better respiratory status since on oxygen The need for it may have prompted some anxiety though

## 2017-03-09 NOTE — Progress Notes (Signed)
Subjective:    Patient ID: Haley Harris, female    DOB: 1929-03-14, 83 y.o.   MRN: [id number]  HPI Here with daughter for follow up of CHF and other chronic health conditions  Clearly better with the oxygen Wears at least 80% of the time--may take it off when sitting quietly Sleeping much better with it  Some cough---her usual  Now noticing bad anxiety Had to go away for wedding for 4 days and it caused stress [facility]) Still having symptoms even after coming home Stomach is churning, "everything inside is shaking" Some depression--- "going on oxygen seemed to be a step backwards"  Weighing daily Up slightly Doubles the furosemide when weight up No chest pain--but does have some pressure Has cardiology follow up next month  Current Outpatient Prescriptions on File Prior to Visit  Medication Sig Dispense Refill  . acetaminophen (TYLENOL) 500 MG tablet Take 1,000 mg by mouth every 8 (eight) hours.    Marland Kitchen albuterol (PROVENTIL) (2.5 MG/3ML) 0.083% nebulizer solution Take 3 mLs (2.5 mg total) by nebulization every 6 (six) hours as needed for wheezing or shortness of breath. 540 mL 3  . aspirin 81 MG tablet Take 81 mg by mouth daily after lunch.     Marland Kitchen atorvastatin (LIPITOR) 40 MG tablet Take 1 tablet (40 mg total) by mouth daily at 6 PM. 30 tablet 3  . bisacodyl (DULCOLAX) 5 MG EC tablet Take 2 tablets (10 mg total) by mouth daily as needed for moderate constipation. 30 tablet 0  . Calcium Carbonate-Vitamin D (CALTRATE 600+D PO) Take 1 tablet by mouth daily after lunch.     . carvedilol (COREG) 6.25 MG tablet Take 1 tablet (6.25 mg total) by mouth 2 (two) times daily with a meal. Take 1 tablet by mouth two  times daily with a meal 60 tablet 3  . denosumab (PROLIA) 60 MG/ML SOLN injection Inject 60 mg into the skin every 6 (six) months. Reported on 11/05/2015    . diclofenac sodium (VOLTAREN) 1 % GEL Apply 2 g topically 2 (two) times daily. Apply to neck and to each shoulder    .  escitalopram (LEXAPRO) 10 MG tablet Take 1 tablet (10 mg total) by mouth daily. 15 tablet 0  . famotidine (PEPCID) 20 MG tablet Take 1 tablet (20 mg total) by mouth 2 (two) times daily. 180 tablet 3  . fluticasone (FLONASE) 50 MCG/ACT nasal spray Use 2 sprays in each  nostril daily 48 g 3  . furosemide (LASIX) 40 MG tablet Take 1 tablet (40 mg total) by mouth 2 (two) times daily. Take two tabs BID with weight gain 60 tablet 3  . gabapentin (NEURONTIN) 100 MG capsule TAKE 1 CAPSULE BY MOUTH TWO TIMES DAILY 180 capsule 3  . losartan (COZAAR) 50 MG tablet TAKE 1 TABLET BY MOUTH  DAILY 90 tablet 3  . Melatonin 3 MG TABS Take 1 tablet by mouth at bedtime.    . Multiple Vitamin (MULITIVITAMIN WITH MINERALS) TABS Take 1 tablet by mouth daily after lunch.     . nitroGLYCERIN (NITROSTAT) 0.4 MG SL tablet Place 1 tablet (0.4 mg total) under the tongue every 5 (five) minutes x 3 doses as needed for chest pain. 25 tablet 12  . PROAIR HFA 108 (90 Base) MCG/ACT inhaler USE 2 PUFFS EVERY 6 HOURS AS NEEDED FOR WHEEZING OR SHORTNESS OF BREATH 8.5 g 5  . spironolactone (ALDACTONE) 25 MG tablet TAKE 1 TABLET BY MOUTH  DAILY 90 tablet 3  .  vitamin B-12 (CYANOCOBALAMIN) 500 MCG tablet Take 1,000 mcg by mouth 3 (three) times a week. Mon, Wed, Fri     No current facility-administered medications on file prior to visit.     Allergies  Allergen Reactions  . Amoxicillin-Pot Clavulanate Anaphylaxis  . Penicillins Anaphylaxis  . Cephalexin Other (See Comments)    Reaction unknown  . Clarithromycin Other (See Comments)    Reaction unknown  . Lidocaine     Patient had tremors following use of lidocaine pain patches  . Nifedipine Other (See Comments)    Reaction unknown  . Nsaids Other (See Comments)    Heart issue  . Olmesartan Medoxomil     REACTION: cough;  But tolerating Losartan 02/2014  . Tramadol Hcl Other (See Comments)    loopy    Past Medical History:  Diagnosis Date  . ACE-inhibitor cough   .  Allergic rhinitis, cause unspecified   . Angina   . Anxiety   . CHF (congestive heart failure) ([place])   . Chronic airway obstruction, not elsewhere classified   . Compression fracture of T12 vertebra ([place]) 10/28/2011  . Depressive disorder, not elsewhere classified   . Esophageal reflux   . Gout   . Heart murmur    aS CHILD  . Malignant neoplasm of breast (female), unspecified site   . Myocardial infarct ([place])   . Neuromuscular disorder ([place])    NEROPATHY FROM STENOSIS  . Occlusion and stenosis of carotid artery without mention of cerebral infarction   . Osteoarthrosis, unspecified whether generalized or localized, unspecified site   . Osteoporosis, unspecified   . Other and unspecified hyperlipidemia   . Other dyspnea and respiratory abnormality   . Oxygen dependent   . Peripheral vascular disease, unspecified ([place])   . Personal history of malignant neoplasm of breast   . Pneumonia   . Recurrent upper respiratory infection (URI)   . Shortness of breath   . Spinal stenosis   . Unspecified cardiovascular disease   . Unspecified essential hypertension   . Unspecified hearing loss   . Unspecified urinary incontinence     Past Surgical History:  Procedure Laterality Date  . ABDOMINAL HYSTERECTOMY    . APPENDECTOMY  1948  . BREAST IMPLANT REMOVAL  06/12/09   right  . BREAST RECONSTRUCTION  1998   Reconstruction   . CARDIAC CATHETERIZATION N/A 01/25/2015   Procedure: Left Heart Cath and Coronary Angiography;  Surgeon: Charolette Forward, MD;  Location: [place] CV LAB;  Service: Cardiovascular;  Laterality: N/A;  . CORONARY ANGIOPLASTY  11/12   distal RCA  . FLEXIBLE SIGMOIDOSCOPY N/A 01/21/2015   Procedure: FLEXIBLE SIGMOIDOSCOPY;  Surgeon: Richmond Campbell, MD;  Location: [facility] ENDOSCOPY;  Service: Endoscopy;  Laterality: N/A;  . FLEXIBLE SIGMOIDOSCOPY  01/22/2015      . FRACTURE SURGERY     fracture right elbow  . LEFT HEART CATH AND CORONARY ANGIOGRAPHY N/A 11/14/2016   Procedure:  Left Heart Cath and Coronary Angiography;  Surgeon: Charolette Forward, MD;  Location: [place] CV LAB;  Service: Cardiovascular;  Laterality: N/A;  . LEFT HEART CATHETERIZATION WITH CORONARY ANGIOGRAM N/A 06/05/2011   Procedure: LEFT HEART CATHETERIZATION WITH CORONARY ANGIOGRAM;  Surgeon: Clent Demark, MD;  Location: [place] CATH LAB;  Service: Cardiovascular;  Laterality: N/A;  . LEFT HEART CATHETERIZATION WITH CORONARY ANGIOGRAM N/A 03/05/2014   Procedure: LEFT HEART CATHETERIZATION WITH CORONARY ANGIOGRAM;  Surgeon: Clent Demark, MD;  Location: [place] CATH LAB;  Service: Cardiovascular;  Laterality: N/A;  .  LUMBAR LAMINECTOMY  2003  . MASTECTOMY  1987   Right  . MASTECTOMY    . MASTOIDECTOMY  childhood  . REDUCTION MAMMAPLASTY  1998   Left  . SHOULDER SURGERY  02/2005   Bilateral fractures with multiple surgeries    Family History  Problem Relation Age of Onset  . Heart attack Father 78  . Cancer Mother        ovarian  . Cancer Brother        lung  . Arthritis Unknown        family    Social History   Social History  . Marital status: Widowed    Spouse name: N/A  . Number of children: 2  . Years of education: N/A   Occupational History  . 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  [employer]   Social History Main Topics  . Smoking status: Former Smoker    Quit date: 07/13/1974  . Smokeless tobacco: Never Used  . Alcohol use 0.0 oz/week     Comment: occasional  . Drug use: No  . Sexual activity: No   Other Topics Concern  . Not on file   Social History Narrative   Has living will   Son or daughter would be health care POA.   Requests DNR--written 03/02/13   No tube feeds if cognitively unaware   Review of Systems Appetite seems off--but eats okay Running to bathroom--"intestines acting up"    Objective:   Physical Exam  Constitutional: No distress.  Neck: No thyromegaly present.  Cardiovascular: Normal rate, regular rhythm and normal heart  sounds.  Exam reveals no gallop.   No murmur heard. Pulmonary/Chest: She has no wheezes. She has no rales.  Decreased breath sounds and muffled at bases (but no sig dullness)  Musculoskeletal: She exhibits no edema.  Lymphadenopathy:    She has no cervical adenopathy.  Psychiatric: She has a normal mood and affect. Her behavior is normal.          Assessment & Plan:

## 2017-03-09 NOTE — Assessment & Plan Note (Signed)
Improved with the oxygen Same regimen

## 2017-03-09 NOTE — Assessment & Plan Note (Signed)
Has stable anginal pattern chest pressure Hasn't needed nitro lately

## 2017-03-09 NOTE — Assessment & Plan Note (Signed)
Some dysthymia but mostly new anxiety---probably related to dyspnea Will give Rx for alprazolam for when really bad

## 2017-03-09 NOTE — Assessment & Plan Note (Signed)
Mostly compensated Adjusts furosemide with daily weights

## 2017-03-13 ENCOUNTER — Encounter (HOSPITAL_COMMUNITY): Payer: Self-pay | Admitting: Emergency Medicine

## 2017-03-13 ENCOUNTER — Inpatient Hospital Stay (HOSPITAL_COMMUNITY)
Admission: EM | Admit: 2017-03-13 | Discharge: 2017-03-16 | DRG: 291 | Disposition: A | Discharge status: Home Health | Payer: Medicare Other | Attending: Internal Medicine | Admitting: Internal Medicine

## 2017-03-13 ENCOUNTER — Emergency Department (HOSPITAL_COMMUNITY): Payer: Medicare Other

## 2017-03-13 DIAGNOSIS — Z66 Do not resuscitate: Secondary | ICD-10-CM | POA: Diagnosis present

## 2017-03-13 DIAGNOSIS — H9190 Unspecified hearing loss, unspecified ear: Secondary | ICD-10-CM | POA: Diagnosis present

## 2017-03-13 DIAGNOSIS — Z90710 Acquired absence of both cervix and uterus: Secondary | ICD-10-CM

## 2017-03-13 DIAGNOSIS — M810 Age-related osteoporosis without current pathological fracture: Secondary | ICD-10-CM | POA: Diagnosis not present

## 2017-03-13 DIAGNOSIS — I5043 Acute on chronic combined systolic (congestive) and diastolic (congestive) heart failure: Secondary | ICD-10-CM | POA: Diagnosis present

## 2017-03-13 DIAGNOSIS — Z87891 Personal history of nicotine dependence: Secondary | ICD-10-CM | POA: Diagnosis not present

## 2017-03-13 DIAGNOSIS — Z8249 Family history of ischemic heart disease and other diseases of the circulatory system: Secondary | ICD-10-CM | POA: Diagnosis not present

## 2017-03-13 DIAGNOSIS — I5040 Unspecified combined systolic (congestive) and diastolic (congestive) heart failure: Secondary | ICD-10-CM

## 2017-03-13 DIAGNOSIS — Z886 Allergy status to analgesic agent status: Secondary | ICD-10-CM

## 2017-03-13 DIAGNOSIS — Z9981 Dependence on supplemental oxygen: Secondary | ICD-10-CM | POA: Diagnosis not present

## 2017-03-13 DIAGNOSIS — J441 Chronic obstructive pulmonary disease with (acute) exacerbation: Secondary | ICD-10-CM

## 2017-03-13 DIAGNOSIS — I10 Essential (primary) hypertension: Secondary | ICD-10-CM | POA: Diagnosis present

## 2017-03-13 DIAGNOSIS — Z880 Allergy status to penicillin: Secondary | ICD-10-CM

## 2017-03-13 DIAGNOSIS — N183 Chronic kidney disease, stage 3 unspecified: Secondary | ICD-10-CM | POA: Diagnosis present

## 2017-03-13 DIAGNOSIS — R0602 Shortness of breath: Secondary | ICD-10-CM | POA: Diagnosis not present

## 2017-03-13 DIAGNOSIS — I739 Peripheral vascular disease, unspecified: Secondary | ICD-10-CM | POA: Diagnosis present

## 2017-03-13 DIAGNOSIS — I252 Old myocardial infarction: Secondary | ICD-10-CM | POA: Diagnosis not present

## 2017-03-13 DIAGNOSIS — Z9011 Acquired absence of right breast and nipple: Secondary | ICD-10-CM | POA: Diagnosis not present

## 2017-03-13 DIAGNOSIS — I110 Hypertensive heart disease with heart failure: Secondary | ICD-10-CM | POA: Diagnosis not present

## 2017-03-13 DIAGNOSIS — Z7982 Long term (current) use of aspirin: Secondary | ICD-10-CM | POA: Diagnosis not present

## 2017-03-13 DIAGNOSIS — E785 Hyperlipidemia, unspecified: Secondary | ICD-10-CM | POA: Diagnosis not present

## 2017-03-13 DIAGNOSIS — I130 Hypertensive heart and chronic kidney disease with heart failure and stage 1 through stage 4 chronic kidney disease, or unspecified chronic kidney disease: Principal | ICD-10-CM | POA: Diagnosis present

## 2017-03-13 DIAGNOSIS — E871 Hypo-osmolality and hyponatremia: Secondary | ICD-10-CM | POA: Diagnosis present

## 2017-03-13 DIAGNOSIS — I25119 Atherosclerotic heart disease of native coronary artery with unspecified angina pectoris: Secondary | ICD-10-CM | POA: Diagnosis not present

## 2017-03-13 DIAGNOSIS — Z884 Allergy status to anesthetic agent status: Secondary | ICD-10-CM

## 2017-03-13 DIAGNOSIS — Z885 Allergy status to narcotic agent status: Secondary | ICD-10-CM

## 2017-03-13 DIAGNOSIS — J9621 Acute and chronic respiratory failure with hypoxia: Secondary | ICD-10-CM | POA: Diagnosis present

## 2017-03-13 DIAGNOSIS — K219 Gastro-esophageal reflux disease without esophagitis: Secondary | ICD-10-CM | POA: Diagnosis present

## 2017-03-13 DIAGNOSIS — Z79899 Other long term (current) drug therapy: Secondary | ICD-10-CM | POA: Diagnosis not present

## 2017-03-13 DIAGNOSIS — R0600 Dyspnea, unspecified: Secondary | ICD-10-CM | POA: Diagnosis not present

## 2017-03-13 DIAGNOSIS — J449 Chronic obstructive pulmonary disease, unspecified: Secondary | ICD-10-CM | POA: Diagnosis not present

## 2017-03-13 DIAGNOSIS — Z888 Allergy status to other drugs, medicaments and biological substances status: Secondary | ICD-10-CM

## 2017-03-13 DIAGNOSIS — Z853 Personal history of malignant neoplasm of breast: Secondary | ICD-10-CM

## 2017-03-13 DIAGNOSIS — Z9861 Coronary angioplasty status: Secondary | ICD-10-CM

## 2017-03-13 LAB — COMPREHENSIVE METABOLIC PANEL
ALBUMIN: 3.7 g/dL (ref 3.5–5.0)
ALT: 14 U/L (ref 14–54)
AST: 25 U/L (ref 15–41)
Alkaline Phosphatase: 55 U/L (ref 38–126)
Anion gap: 11 (ref 5–15)
BILIRUBIN TOTAL: 0.9 mg/dL (ref 0.3–1.2)
BUN: 19 mg/dL (ref 6–20)
CALCIUM: 8.8 mg/dL — AB (ref 8.9–10.3)
CO2: 26 mmol/L (ref 22–32)
CREATININE: 1.05 mg/dL — AB (ref 0.44–1.00)
Chloride: 91 mmol/L — ABNORMAL LOW (ref 101–111)
GFR calc Af Amer: 55 mL/min — ABNORMAL LOW (ref >60)
GFR calc non Af Amer: 47 mL/min — ABNORMAL LOW (ref >60)
GLUCOSE: 174 mg/dL — AB (ref 65–99)
Potassium: 4.4 mmol/L (ref 3.5–5.1)
SODIUM: 128 mmol/L — AB (ref 135–145)
TOTAL PROTEIN: 6.6 g/dL (ref 6.5–8.1)

## 2017-03-13 LAB — I-STAT ARTERIAL BLOOD GAS, ED
ACID-BASE EXCESS: 5 mmol/L — AB (ref 0.0–2.0)
BICARBONATE: 29.8 mmol/L — AB (ref 20.0–28.0)
O2 SAT: 99 %
PCO2 ART: 41.1 mmHg (ref 32.0–48.0)
PO2 ART: 143 mmHg — AB (ref 83.0–108.0)
Patient temperature: 97.2
TCO2: 31 mmol/L (ref 22–32)
pH, Arterial: 7.466 — ABNORMAL HIGH (ref 7.350–7.450)

## 2017-03-13 LAB — CBC WITH DIFFERENTIAL/PLATELET
Basophils Absolute: 0 K/uL (ref 0.0–0.1)
Basophils Relative: 1 %
Eosinophils Absolute: 0.2 K/uL (ref 0.0–0.7)
Eosinophils Relative: 3 %
HCT: 36.3 % (ref 36.0–46.0)
Hemoglobin: 12 g/dL (ref 12.0–15.0)
Lymphocytes Relative: 14 %
Lymphs Abs: 1.2 K/uL (ref 0.7–4.0)
MCH: 32.1 pg (ref 26.0–34.0)
MCHC: 33.1 g/dL (ref 30.0–36.0)
MCV: 97.1 fL (ref 78.0–100.0)
Monocytes Absolute: 1 K/uL (ref 0.1–1.0)
Monocytes Relative: 12 %
Neutro Abs: 5.8 K/uL (ref 1.7–7.7)
Neutrophils Relative %: 70 %
Platelets: 210 K/uL (ref 150–400)
RBC: 3.74 MIL/uL — ABNORMAL LOW (ref 3.87–5.11)
RDW: 12.4 % (ref 11.5–15.5)
WBC: 8.3 K/uL (ref 4.0–10.5)

## 2017-03-13 LAB — BRAIN NATRIURETIC PEPTIDE: B Natriuretic Peptide: 427.1 pg/mL — ABNORMAL HIGH (ref 0.0–100.0)

## 2017-03-13 LAB — MAGNESIUM: MAGNESIUM: 1.8 mg/dL (ref 1.7–2.4)

## 2017-03-13 LAB — TROPONIN I
Troponin I: 0.04 ng/mL (ref <0.03)
Troponin I: 0.11 ng/mL (ref <0.03)

## 2017-03-13 LAB — I-STAT TROPONIN, ED: Troponin i, poc: 0.01 ng/mL (ref 0.00–0.08)

## 2017-03-13 MED ORDER — ASPIRIN EC 81 MG PO TBEC
81.0000 mg | DELAYED_RELEASE_TABLET | Freq: Every day | ORAL | Status: DC
  Administered 2017-03-13 – 2017-03-16 (×4): 81 mg via ORAL
  Filled 2017-03-13 (×4): qty 1

## 2017-03-13 MED ORDER — METHYLPREDNISOLONE SODIUM SUCC 125 MG IJ SOLR
125.0000 mg | Freq: Once | INTRAMUSCULAR | Status: AC
  Administered 2017-03-13: 125 mg via INTRAVENOUS
  Filled 2017-03-13: qty 2

## 2017-03-13 MED ORDER — POTASSIUM CHLORIDE CRYS ER 20 MEQ PO TBCR
20.0000 meq | EXTENDED_RELEASE_TABLET | Freq: Two times a day (BID) | ORAL | Status: DC
  Administered 2017-03-13 – 2017-03-16 (×7): 20 meq via ORAL
  Filled 2017-03-13 (×8): qty 1

## 2017-03-13 MED ORDER — ONDANSETRON HCL 4 MG/2ML IJ SOLN: 4.0000 mg | Freq: Four times a day (QID) | INTRAMUSCULAR | Status: DC | PRN

## 2017-03-13 MED ORDER — FUROSEMIDE 10 MG/ML IJ SOLN: 40.0000 mg | Freq: Two times a day (BID) | INTRAMUSCULAR | Status: DC

## 2017-03-13 MED ORDER — MONTELUKAST SODIUM 10 MG PO TABS
10.0000 mg | ORAL_TABLET | Freq: Every day | ORAL | Status: DC
  Administered 2017-03-13 – 2017-03-16 (×4): 10 mg via ORAL
  Filled 2017-03-13 (×5): qty 1

## 2017-03-13 MED ORDER — ESCITALOPRAM OXALATE 10 MG PO TABS
10.0000 mg | ORAL_TABLET | Freq: Every day | ORAL | Status: DC
  Administered 2017-03-13 – 2017-03-16 (×4): 10 mg via ORAL
  Filled 2017-03-13 (×4): qty 1

## 2017-03-13 MED ORDER — ACETAMINOPHEN 650 MG RE SUPP: 650.0000 mg | Freq: Four times a day (QID) | RECTAL | Status: DC | PRN

## 2017-03-13 MED ORDER — ALBUTEROL SULFATE (2.5 MG/3ML) 0.083% IN NEBU: 2.5000 mg | INHALATION_SOLUTION | RESPIRATORY_TRACT | Status: DC | PRN

## 2017-03-13 MED ORDER — CARVEDILOL 6.25 MG PO TABS
6.2500 mg | ORAL_TABLET | Freq: Two times a day (BID) | ORAL | Status: DC
  Administered 2017-03-13 – 2017-03-16 (×7): 6.25 mg via ORAL
  Filled 2017-03-13 (×7): qty 1

## 2017-03-13 MED ORDER — NITROGLYCERIN 2 % TD OINT
1.0000 [in_us] | TOPICAL_OINTMENT | Freq: Once | TRANSDERMAL | Status: AC
  Administered 2017-03-13: 1 [in_us] via TOPICAL
  Filled 2017-03-13: qty 1

## 2017-03-13 MED ORDER — FAMOTIDINE 20 MG PO TABS
20.0000 mg | ORAL_TABLET | Freq: Two times a day (BID) | ORAL | Status: DC
  Administered 2017-03-13 – 2017-03-15 (×5): 20 mg via ORAL
  Filled 2017-03-13 (×5): qty 1

## 2017-03-13 MED ORDER — ALBUTEROL (5 MG/ML) CONTINUOUS INHALATION SOLN
10.0000 mg/h | INHALATION_SOLUTION | RESPIRATORY_TRACT | Status: DC
  Administered 2017-03-13: 10 mg/h via RESPIRATORY_TRACT
  Filled 2017-03-13: qty 20

## 2017-03-13 MED ORDER — IPRATROPIUM BROMIDE 0.02 % IN SOLN
0.5000 mg | Freq: Once | RESPIRATORY_TRACT | Status: AC
  Administered 2017-03-13: 0.5 mg via RESPIRATORY_TRACT
  Filled 2017-03-13: qty 2.5

## 2017-03-13 MED ORDER — ALPRAZOLAM 0.25 MG PO TABS: 0.1250 mg | ORAL_TABLET | Freq: Two times a day (BID) | ORAL | Status: DC | PRN

## 2017-03-13 MED ORDER — SPIRONOLACTONE 25 MG PO TABS
25.0000 mg | ORAL_TABLET | Freq: Every day | ORAL | Status: DC
  Administered 2017-03-13 – 2017-03-16 (×4): 25 mg via ORAL
  Filled 2017-03-13 (×5): qty 1

## 2017-03-13 MED ORDER — ENOXAPARIN SODIUM 40 MG/0.4ML ~~LOC~~ SOLN
40.0000 mg | SUBCUTANEOUS | Status: DC
  Administered 2017-03-14 – 2017-03-16 (×3): 40 mg via SUBCUTANEOUS
  Filled 2017-03-13 (×4): qty 0.4

## 2017-03-13 MED ORDER — ONDANSETRON HCL 4 MG PO TABS: 4.0000 mg | ORAL_TABLET | Freq: Four times a day (QID) | ORAL | Status: DC | PRN

## 2017-03-13 MED ORDER — LOSARTAN POTASSIUM 50 MG PO TABS
50.0000 mg | ORAL_TABLET | Freq: Every day | ORAL | Status: DC
  Administered 2017-03-13: 50 mg via ORAL
  Filled 2017-03-13 (×2): qty 1

## 2017-03-13 MED ORDER — DICLOFENAC SODIUM 1 % TD GEL: 2.0000 g | Freq: Two times a day (BID) | TRANSDERMAL | Status: DC | PRN

## 2017-03-13 MED ORDER — ACETAMINOPHEN 325 MG PO TABS
650.0000 mg | ORAL_TABLET | Freq: Four times a day (QID) | ORAL | Status: DC | PRN
  Administered 2017-03-14 – 2017-03-15 (×2): 650 mg via ORAL
  Filled 2017-03-13 (×2): qty 2

## 2017-03-13 MED ORDER — ALBUTEROL SULFATE (2.5 MG/3ML) 0.083% IN NEBU: 2.5000 mg | INHALATION_SOLUTION | Freq: Four times a day (QID) | RESPIRATORY_TRACT | Status: DC | PRN

## 2017-03-13 MED ORDER — BISACODYL 5 MG PO TBEC
10.0000 mg | DELAYED_RELEASE_TABLET | Freq: Every day | ORAL | Status: DC | PRN
  Filled 2017-03-13: qty 2

## 2017-03-13 MED ORDER — MELATONIN 3 MG PO TABS
1.0000 | ORAL_TABLET | Freq: Every day | ORAL | Status: DC
  Administered 2017-03-13 – 2017-03-15 (×3): 3 mg via ORAL
  Filled 2017-03-13 (×3): qty 1

## 2017-03-13 MED ORDER — FUROSEMIDE 10 MG/ML IJ SOLN
40.0000 mg | Freq: Two times a day (BID) | INTRAMUSCULAR | Status: DC
  Administered 2017-03-13 – 2017-03-16 (×6): 40 mg via INTRAVENOUS
  Filled 2017-03-13 (×6): qty 4

## 2017-03-13 MED ORDER — FUROSEMIDE 10 MG/ML IJ SOLN
40.0000 mg | Freq: Every day | INTRAMUSCULAR | Status: DC
  Administered 2017-03-13: 40 mg via INTRAVENOUS
  Filled 2017-03-13: qty 4

## 2017-03-13 MED ORDER — ATORVASTATIN CALCIUM 40 MG PO TABS
40.0000 mg | ORAL_TABLET | Freq: Every day | ORAL | Status: DC
  Administered 2017-03-13 – 2017-03-15 (×3): 40 mg via ORAL
  Filled 2017-03-13 (×4): qty 1

## 2017-03-13 MED ORDER — GABAPENTIN 100 MG PO CAPS
100.0000 mg | ORAL_CAPSULE | Freq: Two times a day (BID) | ORAL | Status: DC
  Administered 2017-03-13 – 2017-03-16 (×7): 100 mg via ORAL
  Filled 2017-03-13 (×7): qty 1

## 2017-03-13 NOTE — ED Notes (Signed)
inpt bed ordered for pt

## 2017-03-13 NOTE — ED Notes (Signed)
Family leaving to eat breakfast. Will come back with done.

## 2017-03-13 NOTE — Progress Notes (Signed)
PROGRESS NOTE  Haley Harris  WUJ:[id number] DOB: 23-Dec-1928 DOA: 03/13/2017 PCP: Haley Carbon, MD Outpatient Specialists:  Subjective: Seen with her daughter at bedside, mild distress when she talks.  Brief Narrative:  Haley Harris is a 83 y.o. female with a past medical history significant for CHF EF 45%, CAD s/p PCI x2, HTN, COPD on home O2 now, and CKD III who presents with dyspnea. The patient was completely in her usual state of health until this morning at 4 AM when she woke unable to breathe and wheezing. Her home breathing treatments that were, so she called EMS. EMS found her with rales, administered sublingual nitroglycerin 5 and CPAP with some relief.   Assessment & Plan:   Principal Problem:   Acute on chronic respiratory failure with hypoxia (HCC) Active Problems:   COPD (chronic obstructive pulmonary disease) (HCC)   Atherosclerotic heart disease of native coronary artery with angina pectoris (HCC)   Hyponatremia   Chronic kidney disease, stage III (moderate)   Essential hypertension   Acute on chronic combined systolic and diastolic heart failure (HCC)   Benign hypertensive heart disease with CHF and with combined systolic and diastolic dysfunction, NYHA class 3 ([place])  Patient seen and examined him and data base reviewed, patient seen earlier today by my colleague Dr. Loleta Books. This is a no charge note, came in with shortness of breath likely secondary to acute on chronic CHFpEF  Acute on chronic systolic and diastolic CHF:  Edema on CXR, BNP elevated.  Weight at home recently has been normal, per patient, chart review shows she had complained of increased weight last week at PCP's office.  WELLS score 1.5, low risk, doubt PE.  No fever, sputum.   -Furosemide 40 mg IV daily -K supplement -Strict I/Os, daily weights, telemetry  -Daily monitoring renal function -Continue Aldactone, BB, losartan  Hyponatremia:  Chronic, hypervolemic. -Daily BMP   CKD III:    At baseline. -Close monitoring of renal function  Hypertension and CV disease secondary prevention:  -Continue statin, aspirin -Continue Aldactone, BB, losartan  COPD:  -Continue Singulair, allegra -Continue albuterol PRN  Other medications:  -Continue gabapentin -Continue iron -Continue Pepcid -Continue Lexapro     DVT prophylaxis:  Code Status: Full Code Family Communication:  Disposition Plan:  Diet: Diet Heart Room service appropriate? Yes; Fluid consistency: Thin  Consultants:   None  Procedures:   None  Antimicrobials:   None   Objective: Vitals:   03/13/17 1245 03/13/17 1300 03/13/17 1315 03/13/17 1330  BP: 111/63 (!) 100/53 (!) 99/57 114/75  Pulse: 90 85 86 86  Resp: 13 15 16 19   Temp:      TempSrc:      SpO2: 99% 98% 99% 99%  Weight:      Height:        Intake/Output Summary (Last 24 hours) at 03/13/17 1515 Last data filed at 03/13/17 1030  Gross per 24 hour  Intake                0 ml  Output              100 ml  Net             -100 ml   Filed Weights   03/13/17 0447  Weight: 79.4 kg (175 lb)    Examination: General exam: Appears calm and comfortable  Respiratory system: Clear to auscultation. Respiratory effort normal. Cardiovascular system: S1 & S2 heard, RRR. No JVD, murmurs, rubs, gallops  or clicks. No pedal edema. Gastrointestinal system: Abdomen is nondistended, soft and nontender. No organomegaly or masses felt. Normal bowel sounds heard. Central nervous system: Alert and oriented. No focal neurological deficits. Extremities: Symmetric 5 x 5 power. Skin: No rashes, lesions or ulcers Psychiatry: Judgement and insight appear normal. Mood & affect appropriate.   Data Reviewed: I have personally reviewed following labs and imaging studies  CBC:  Recent Labs Lab 03/13/17 0453  WBC 8.3  NEUTROABS 5.8  HGB 12.0  HCT 36.3  MCV 97.1  PLT 836   Basic Metabolic Panel:  Recent Labs Lab 03/13/17 0453 03/13/17 0742   NA 128*  --   K 4.4  --   CL 91*  --   CO2 26  --   GLUCOSE 174*  --   BUN 19  --   CREATININE 1.05*  --   CALCIUM 8.8*  --   MG  --  1.8   GFR: Estimated Creatinine Clearance: 40.8 mL/min (A) (by C-G formula based on SCr of 1.05 mg/dL (H)). Liver Function Tests:  Recent Labs Lab 03/13/17 0453  AST 25  ALT 14  ALKPHOS 55  BILITOT 0.9  PROT 6.6  ALBUMIN 3.7   No results for input(s): LIPASE, AMYLASE in the last 168 hours. No results for input(s): AMMONIA in the last 168 hours. Coagulation Profile: No results for input(s): INR, PROTIME in the last 168 hours. Cardiac Enzymes:  Recent Labs Lab 03/13/17 0742  TROPONINI 0.04*   BNP (last 3 results) No results for input(s): PROBNP in the last 8760 hours. HbA1C: No results for input(s): HGBA1C in the last 72 hours. CBG: No results for input(s): GLUCAP in the last 168 hours. Lipid Profile: No results for input(s): CHOL, HDL, LDLCALC, TRIG, CHOLHDL, LDLDIRECT in the last 72 hours. Thyroid Function Tests: No results for input(s): TSH, T4TOTAL, FREET4, T3FREE, THYROIDAB in the last 72 hours. Anemia Panel: No results for input(s): VITAMINB12, FOLATE, FERRITIN, TIBC, IRON, RETICCTPCT in the last 72 hours. Urine analysis:    Component Value Date/Time   COLORURINE YELLOW 10/29/2015 1849   APPEARANCEUR CLEAR 10/29/2015 1849   LABSPEC 1.012 10/29/2015 1849   PHURINE 6.0 10/29/2015 1849   GLUCOSEU NEGATIVE 10/29/2015 1849   HGBUR NEGATIVE 10/29/2015 1849   BILIRUBINUR NEGATIVE 10/29/2015 1849   BILIRUBINUR neg 08/05/2011 1119   KETONESUR NEGATIVE 10/29/2015 1849   PROTEINUR NEGATIVE 10/29/2015 1849   UROBILINOGEN 1.0 01/18/2015 1312   NITRITE NEGATIVE 10/29/2015 1849   LEUKOCYTESUR SMALL (A) 10/29/2015 1849   Sepsis Labs: @LABRCNTIP (procalcitonin:4,lacticidven:4)  )No results found for this or any previous visit (from the past 240 hour(s)).   Invalid input(s): PROCALCITONIN, LACTICACIDVEN   Radiology  Studies: Dg Chest Port 1 View  Result Date: 03/13/2017 CLINICAL DATA:  Worsening dyspnea with chest congestion and wheezing, onset this morning. EXAM: PORTABLE CHEST 1 VIEW COMPARISON:  7128 FINDINGS: Moderate cardiomegaly, unchanged. Left base consolidation. Small effusions. Mild diffuse interstitial prominence. IMPRESSION: The findings probably represent congestive heart failure with interstitial edema, small effusions and asymmetric alveolar edema. Cannot entirely exclude left base pneumonia. Electronically Signed   By: Andreas Newport M.D.   On: 03/13/2017 05:38        Scheduled Meds: . aspirin EC  81 mg Oral QPC lunch  . atorvastatin  40 mg Oral q1800  . carvedilol  6.25 mg Oral BID WC  . enoxaparin (LOVENOX) injection  40 mg Subcutaneous Q24H  . escitalopram  10 mg Oral Daily  . famotidine  20 mg  Oral BID  . furosemide  40 mg Intravenous Daily  . gabapentin  100 mg Oral BID  . losartan  50 mg Oral Daily  . Melatonin  1 tablet Oral QHS  . montelukast  10 mg Oral Daily  . potassium chloride  20 mEq Oral BID  . spironolactone  25 mg Oral Daily   Continuous Infusions:   LOS: 0 days    Time spent: 35 minutes    Birdie Hopes, MD [facility] Pager [phone]  If 7PM-7AM, please contact night-coverage [email or web address] Password [email or web address] 03/13/2017, 3:15 PM

## 2017-03-13 NOTE — ED Notes (Signed)
BIPAP continues ,nebulizer treatment administered by RT , IV site intact , O2 Sat= 100% , respirations improved /denies chest pain , IV sites intact .

## 2017-03-13 NOTE — ED Notes (Signed)
Attempted report 

## 2017-03-13 NOTE — ED Notes (Signed)
Pt.'s BIPAP discontinued by admitting MD ( Dr. Loleta Books ) , 4 lpm/nasal cannula applied , O2 sat= 96% , denies SOB at this time , respirations unlabored , IV site intact .

## 2017-03-13 NOTE — ED Notes (Signed)
Assisted pt with bedpan

## 2017-03-13 NOTE — ED Triage Notes (Signed)
Patient arrived with EMS from home reports worsening SOB with chest congestion and wheezing this morning , she received ASA 325 mg po and 5 tabs NTG sl , placed on a CPAP prior to arrival . Pt. stated mild relief at arrival .

## 2017-03-13 NOTE — ED Notes (Signed)
Daughter [name] provided phone number to call if the pt gets a room upstairs, [phone]

## 2017-03-13 NOTE — H&P (Signed)
History and Physical  Patient Name: Haley Harris     FUX:[id number]    DOB: [birth year]    DOA: 03/13/2017 PCP: Venia Carbon, MD  Patient coming from: Home via EMS  Chief Complaint: Waking with dyspnea      HPI: Haley Harris is a 83 y.o. female with a past medical history significant for CHF EF 45%, CAD s/p PCI x2, HTN, COPD on home O2 now, and CKD III who presents with dyspnea.  The patient was completely in her usual state of health until this morning at 4 AM when she woke unable to breathe and wheezing. Her home breathing treatments that were, so she called EMS. EMS found her with rales, administered sublingual nitroglycerin 5 and CPAP with some relief.  ED course: -Afebrile, heart rate 112, respirations 21, pulse ox 100% on BiPAP FiO2 30%, blood pressure 128/90 -Na 128, K 4.4, Cr 1.05, WBC 8.3K, Hgb 12 -ABG showed pH 7.46, pCO2 41 -BNP 427 -Troponin normal -CXR showed edema -She was given solu-medrol, nebs and TRH were asked to admit for CHF flare     ROS: Review of Systems  Constitutional: Negative for fever and malaise/fatigue.  Respiratory: Positive for cough, shortness of breath and wheezing. Negative for sputum production.   Cardiovascular: Negative for chest pain, orthopnea, leg swelling and PND.  All other systems reviewed and are negative.         Past Medical History:  Diagnosis Date  . ACE-inhibitor cough   . Allergic rhinitis, cause unspecified   . Angina   . Anxiety   . CHF (congestive heart failure) ([place])   . Chronic airway obstruction, not elsewhere classified   . Compression fracture of T12 vertebra ([place]) 10/28/2011  . Depressive disorder, not elsewhere classified   . Esophageal reflux   . Gout   . Heart murmur    aS CHILD  . Malignant neoplasm of breast (female), unspecified site   . Myocardial infarct ([place])   . Neuromuscular disorder ([place])    NEROPATHY FROM STENOSIS  . Occlusion and stenosis of carotid artery without mention of cerebral  infarction   . Osteoarthrosis, unspecified whether generalized or localized, unspecified site   . Osteoporosis, unspecified   . Other and unspecified hyperlipidemia   . Other dyspnea and respiratory abnormality   . Oxygen dependent   . Peripheral vascular disease, unspecified ([place])   . Personal history of malignant neoplasm of breast   . Pneumonia   . Recurrent upper respiratory infection (URI)   . Shortness of breath   . Spinal stenosis   . Unspecified cardiovascular disease   . Unspecified essential hypertension   . Unspecified hearing loss   . Unspecified urinary incontinence     Past Surgical History:  Procedure Laterality Date  . ABDOMINAL HYSTERECTOMY    . APPENDECTOMY  1948  . BREAST IMPLANT REMOVAL  06/12/09   right  . BREAST RECONSTRUCTION  1998   Reconstruction   . CARDIAC CATHETERIZATION N/A 01/25/2015   Procedure: Left Heart Cath and Coronary Angiography;  Surgeon: Charolette Forward, MD;  Location: [place] [facility];  Service: Cardiovascular;  Laterality: N/A;  . CORONARY ANGIOPLASTY  11/12   distal RCA  . FLEXIBLE SIGMOIDOSCOPY N/A 01/21/2015   Procedure: FLEXIBLE SIGMOIDOSCOPY;  Surgeon: Richmond Campbell, MD;  Location: [facility] ENDOSCOPY;  Service: Endoscopy;  Laterality: N/A;  . FLEXIBLE SIGMOIDOSCOPY  01/22/2015      . FRACTURE SURGERY     fracture right elbow  . LEFT HEART  CATH AND CORONARY ANGIOGRAPHY N/A 11/14/2016   Procedure: Left Heart Cath and Coronary Angiography;  Surgeon: Charolette Forward, MD;  Location: [place];  Service: Cardiovascular;  Laterality: N/A;  . LEFT HEART CATHETERIZATION WITH CORONARY ANGIOGRAM N/A 06/05/2011   Procedure: LEFT HEART CATHETERIZATION WITH CORONARY ANGIOGRAM;  Surgeon: Clent Demark, MD;  Location: [place] CATH LAB;  Service: Cardiovascular;  Laterality: N/A;  . LEFT HEART CATHETERIZATION WITH CORONARY ANGIOGRAM N/A 03/05/2014   Procedure: LEFT HEART CATHETERIZATION WITH CORONARY ANGIOGRAM;  Surgeon: Clent Demark, MD;  Location:  [place] CATH LAB;  Service: Cardiovascular;  Laterality: N/A;  . LUMBAR LAMINECTOMY  2003  . MASTECTOMY  1987   Right  . MASTECTOMY    . MASTOIDECTOMY  childhood  . REDUCTION MAMMAPLASTY  1998   Left  . SHOULDER SURGERY  02/2005   Bilateral fractures with multiple surgeries    Social History: Patient lives with her daughter.  The patient walks with a rollator.  Nonsmoker. From [place].    Allergies  Allergen Reactions  . Amoxicillin-Pot Clavulanate Anaphylaxis  . Penicillins Anaphylaxis  . Cephalexin Other (See Comments)    Reaction unknown  . Clarithromycin Other (See Comments)    Reaction unknown  . Lidocaine     Patient had tremors following use of lidocaine pain patches  . Nifedipine Other (See Comments)    Reaction unknown  . Nsaids Other (See Comments)    Heart issue  . Olmesartan Medoxomil     REACTION: cough;  But tolerating Losartan 02/2014  . Tramadol Hcl Other (See Comments)    loopy    Family history: family history includes Arthritis in her unknown relative; Cancer in her brother and mother; Heart attack (age of onset: 81) in her father.  Prior to Admission medications   Medication Sig Start Date End Date Taking? Authorizing Provider  acetaminophen (TYLENOL) 500 MG tablet Take 1,000 mg by mouth every 8 (eight) hours.    [provider]  albuterol (PROVENTIL) (2.5 MG/3ML) 0.083% nebulizer solution Take 3 mLs (2.5 mg total) by nebulization every 6 (six) hours as needed for wheezing or shortness of breath. 02/18/15   Venia Carbon, MD  ALPRAZolam Duanne Moron) 0.25 MG tablet Take 0.5-1 tablets (0.125-0.25 mg total) by mouth 2 (two) times daily as needed for anxiety. 03/09/17   Venia Carbon, MD  aspirin 81 MG tablet Take 81 mg by mouth daily after lunch.     [provider]  atorvastatin (LIPITOR) 40 MG tablet Take 1 tablet (40 mg total) by mouth daily at 6 PM. 11/19/16   Charolette Forward, MD  bisacodyl (DULCOLAX) 5 MG EC tablet Take 2 tablets (10 mg  total) by mouth daily as needed for moderate constipation. 11/19/16   Charolette Forward, MD  Calcium Carbonate-Vitamin D (CALTRATE 600+D PO) Take 1 tablet by mouth daily after lunch.     [provider]  carvedilol (COREG) 6.25 MG tablet Take 1 tablet (6.25 mg total) by mouth 2 (two) times daily with a meal. Take 1 tablet by mouth two  times daily with a meal 11/19/16   Charolette Forward, MD  denosumab (PROLIA) 60 MG/ML SOLN injection Inject 60 mg into the skin every 6 (six) months. Reported on 11/05/2015    [provider]  diclofenac sodium (VOLTAREN) 1 % GEL Apply 2 g topically 2 (two) times daily. Apply to neck and to each shoulder    [provider]  escitalopram (LEXAPRO) 10 MG tablet Take 1 tablet (  10 mg total) by mouth daily. 10/27/16   Venia Carbon, MD  famotidine (PEPCID) 20 MG tablet Take 1 tablet (20 mg total) by mouth 2 (two) times daily. 03/03/16   Venia Carbon, MD  fluticasone Asencion Islam) 50 MCG/ACT nasal spray Use 2 sprays in each  nostril daily 03/11/16   Venia Carbon, MD  furosemide (LASIX) 40 MG tablet Take 1 tablet (40 mg total) by mouth 2 (two) times daily. Take two tabs BID with weight gain 11/19/16   Charolette Forward, MD  gabapentin (NEURONTIN) 100 MG capsule TAKE 1 CAPSULE BY MOUTH TWO TIMES DAILY 08/31/16   Venia Carbon, MD  losartan (COZAAR) 50 MG tablet TAKE 1 TABLET BY MOUTH  DAILY 01/19/17   [name] I, MD  Melatonin 3 MG TABS Take 1 tablet by mouth at bedtime.    [provider]  montelukast (SINGULAIR) 10 MG tablet TAKE 1 TABLET BY MOUTH  DAILY 03/09/17   Venia Carbon, MD  Multiple Vitamin (MULITIVITAMIN WITH MINERALS) TABS Take 1 tablet by mouth daily after lunch.     [provider]  nitroGLYCERIN (NITROSTAT) 0.4 MG SL tablet Place 1 tablet (0.4 mg total) under the tongue every 5 (five) minutes x 3 doses as needed for chest pain. 03/09/17   Venia Carbon, MD  PROAIR HFA 108 [phone] Base) MCG/ACT inhaler USE 2  PUFFS EVERY 6 HOURS AS NEEDED FOR WHEEZING OR SHORTNESS OF BREATH 02/13/16   [name] I, MD  spironolactone (ALDACTONE) 25 MG tablet TAKE 1 TABLET BY MOUTH  DAILY 08/10/16   Venia Carbon, MD  vitamin B-12 (CYANOCOBALAMIN) 500 MCG tablet Take 1,000 mcg by mouth 3 (three) times a week. Mon, Wed, Fri    [provider]       Physical Exam: BP 130/66   Pulse 85   Temp (!) 97.2 F (36.2 C) (Temporal)   Resp 16   Ht 5\' 5"  (1.651 m)   Wt 79.4 kg (175 lb)   SpO2 97%   BMI 29.12 kg/m  General appearance: Well-developed, overweight adult female, alert and improving on BiPAP.   Eyes: Anicteric, conjunctiva pink, lids and lashes normal. PERRL.    ENT: No nasal deformity, discharge, epistaxis.  Hearing normal. OP moist without lesions.   Neck: No neck masses.  Trachea midline.  No thyromegaly/tenderness. Lymph: No cervical or supraclavicular lymphadenopathy. Skin: Warm and dry.  No jaundice.  No suspicious rashes or lesions. Cardiac: Tachycardic, regular, nl S1-S2, no murmurs appreciated.  Capillary refill is brisk.  JVP not visible.  No LE edema.  Radial and DP pulses 2+ and symmetric. Respiratory: Tachypneic, on BiPAP.  Rales at bases.  No wheezes.  Poor air movement, shallow inspiraitons. Abdomen: Abdomen soft.  No TTP. No ascites, distension, hepatosplenomegaly.   MSK: No deformities or effusions.  No cyanosis or clubbing.  Kyphosis noted. Neuro: Cranial nerves normal.  Sensation intact to light touch. Speech is fluent.  Muscle strength normal.    Psych: Sensorium intact and responding to questions, attention normal.  Behavior appropriate.  Affect normal.  Judgment and insight appear normal.     Labs on Admission:  I have personally reviewed following labs and imaging studies: CBC:  Recent Labs Lab 03/13/17 0453  WBC 8.3  NEUTROABS 5.8  HGB 12.0  HCT 36.3  MCV 97.1  PLT 124   Basic Metabolic Panel:  Recent Labs Lab 03/13/17 0453  NA 128*  K 4.4  CL 91*  CO2 26  GLUCOSE 174*  BUN 19  CREATININE 1.05*  CALCIUM 8.8*   GFR: Estimated Creatinine Clearance: 40.8 mL/min (A) (by C-G formula based on SCr of 1.05 mg/dL (H)).  Liver Function Tests:  Recent Labs Lab 03/13/17 0453  AST 25  ALT 14  ALKPHOS 55  BILITOT 0.9  PROT 6.6  ALBUMIN 3.7   No results for input(s): LIPASE, AMYLASE in the last 168 hours. No results for input(s): AMMONIA in the last 168 hours. Coagulation Profile: No results for input(s): INR, PROTIME in the last 168 hours. Cardiac Enzymes: No results for input(s): CKTOTAL, CKMB, CKMBINDEX, TROPONINI in the last 168 hours. BNP (last 3 results) No results for input(s): PROBNP in the last 8760 hours. HbA1C: No results for input(s): HGBA1C in the last 72 hours. CBG: No results for input(s): GLUCAP in the last 168 hours. Lipid Profile: No results for input(s): CHOL, HDL, LDLCALC, TRIG, CHOLHDL, LDLDIRECT in the last 72 hours. Thyroid Function Tests: No results for input(s): TSH, T4TOTAL, FREET4, T3FREE, THYROIDAB in the last 72 hours. Anemia Panel: No results for input(s): VITAMINB12, FOLATE, FERRITIN, TIBC, IRON, RETICCTPCT in the last 72 hours. Sepsis Labs:  Invalid input(s): PROCALCITONIN, LACTICIDVEN No results found for this or any previous visit (from the past 240 hour(s)).       Radiological Exams on Admission: Personally reviewed CXR shows perihilar edema, consistent with CHF: Dg Chest Port 1 View  Result Date: 03/13/2017 CLINICAL DATA:  Worsening dyspnea with chest congestion and wheezing, onset this morning. EXAM: PORTABLE CHEST 1 VIEW COMPARISON:  7128 FINDINGS: Moderate cardiomegaly, unchanged. Left base consolidation. Small effusions. Mild diffuse interstitial prominence. IMPRESSION: The findings probably represent congestive heart failure with interstitial edema, small effusions and asymmetric alveolar edema. Cannot entirely exclude left base pneumonia. Electronically Signed   By: Andreas Newport  M.D.   On: 03/13/2017 05:38    EKG: Independently reviewed. Rate 123, QTc 521, IVCD, no change from previous.  Echocardiogram May 2018: Report reviewed E 40-45% Grade I DD PAP 44         Assessment/Plan  1. Acute on chronic systolic and diastolic CHF:  Edema on CXR, BNP elevated.  Weight at home recently has been normal, per patient, chart review shows she had complained of increased weight last week at PCP's office.  WELLS score 1.5, low risk, doubt PE.  No fever, sputum.   -Furosemide 40 mg IV daily -K supplement -Strict I/Os, daily weights, telemetry  -Daily monitoring renal function -Continue Aldactone, BB, losartan   2. Hyponatremia:  Chronic, hypervolemic. -Daily BMP  3. CKD III:  At baseline. -Close monitoring of renal function  4. Hypertension and CV disease secondary prevention:  -Continue statin, aspirin -Continue Aldactone, BB, losartan  5. COPD:  -Continue Singulair, allegra -Continue albuterol PRN  6. Other medications:  -Continue gabapentin -Continue iron -Continue Pepcid -Continue Lexapro      DVT prophylaxis: Lovenox  Code Status: FULL  Family Communication: Daughters at bedside  Disposition Plan: Anticipate IV furosemide, close monitoring of electrolytes and renal function Consults called: None Admission status: INAPTIENT    Medical decision making: Patient seen at 5:55 AM on 03/13/2017.  The patient was discussed with Dr. Leonette Monarch.  What exists of the patient's chart was reviewed in depth and summarized above.  Clinical condition: stable.        Edwin Dada [facility] Pager [phone]     At the time of admission, it appears that the appropriate admission status for this patient  is INPATIENT. This is judged to be reasonable and necessary in order to provide the required intensity of service to ensure the patient's safety given the presenting symptoms, physical exam findings, and initial radiographic and  laboratory data in the context of their chronic comorbidities.  Together, these circumstances are felt to place her at high risk for further clinical deterioration threatening life, limb, or organ.   Patient requires inpatient status due to high intensity of service, high risk for further deterioration and high frequency of surveillance required because of this severe exacerbation of their chronic organ failure requiring BiPAP.  I certify that at the point of admission it is my clinical judgment that the patient will require inpatient hospital care spanning beyond 2 midnights from the point of admission and that early discharge would result in unnecessary risk of decompensation and readmission or threat to life, limb or bodily function.

## 2017-03-13 NOTE — Progress Notes (Signed)
Pt is DNR and has gold form in chart.  MD paged to change order in epic.

## 2017-03-13 NOTE — ED Notes (Signed)
Admitting MD returned call and aware of trop result

## 2017-03-13 NOTE — ED Notes (Signed)
Admitting paged for critical trop 0.04

## 2017-03-13 NOTE — ED Provider Notes (Signed)
[facility] DEPT Provider Note   CSN: [id number] Arrival date & time: 03/13/17  0447     History   Chief Complaint Chief Complaint  Patient presents with  . Shortness of Breath    HPI Haley Harris is a 83 y.o. female.  HPI 83 year old female with extensive past medical history listed below including COPDon 2 L nasal cannula at home, CHF on 40 mg of Lasix daily, who presents to the emergency department with sudden onset shortness of breath that began 40 minutes prior to arrival EMS was called who noted patient had diffuse crackles on exam. Patient was given 5 some lingual nitroglycerin and placed on CPAP resulting in improvement of her shortness of breath. Patient also given 325 of aspirin. Patient denied any chest pain prior or during this episode.  Patient endorses baseline cough, but denies recent fevers, URI symptoms. No nausea, vomiting, abdominal pain. Denies any prior DVTs or pulmonary emboli.  Denies any other alleviating or aggravating factors. Denies any other physical complaints. Past Medical History:  Diagnosis Date  . ACE-inhibitor cough   . Allergic rhinitis, cause unspecified   . Angina   . Anxiety   . CHF (congestive heart failure) ([place])   . Chronic airway obstruction, not elsewhere classified   . Compression fracture of T12 vertebra ([place]) 10/28/2011  . Depressive disorder, not elsewhere classified   . Esophageal reflux   . Gout   . Heart murmur    aS CHILD  . Malignant neoplasm of breast (female), unspecified site   . Myocardial infarct ([place])   . 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 Occlusion and stenosis of carotid artery without mention of cerebral infarction   . Osteoarthrosis, unspecified whether generalized or localized, unspecified site   . Osteoporosis, unspecified   . Other and unspecified hyperlipidemia   . Other dyspnea and respiratory abnormality   . Oxygen dependent   . Peripheral vascular disease, unspecified ([place])     . Personal history of malignant neoplasm of breast   . Pneumonia   . Recurrent upper respiratory infection (URI)   . Shortness of breath   . Spinal stenosis   . Unspecified cardiovascular disease   . Unspecified essential hypertension   . Unspecified hearing loss   . Unspecified urinary incontinence     Patient Active Problem List   Diagnosis Date Noted  . Acute on chronic respiratory failure with hypoxia ([place]) 03/13/2017  . Chronic hypoxemic respiratory failure ([place]) 03/09/2017  . DOE (dyspnea on exertion) 01/21/2017  . Gout   . Pain of finger of left hand 12/15/2016  . Benign hypertensive heart disease with CHF and with combined systolic and diastolic dysfunction, NYHA class 3 ([place]) 12/06/2016  . Osteoarthritis of left hip 09/28/2016  . Cervical spondylosis without myelopathy 07/31/2016  . Primary osteoarthritis of both first carpometacarpal joints 07/31/2016  . Acute on chronic combined systolic and diastolic heart failure ([place]) 07/22/2016  . Spinal stenosis in cervical region 05/29/2016  . Venous stasis dermatitis 11/13/2015  . Essential hypertension 10/30/2015  . Diverticulitis of colon 02/18/2015  . Chronic combined systolic and diastolic CHF (congestive heart failure) ([place])   . Chronic kidney disease, stage III (moderate)   . PVD (peripheral vascular disease) ([place])   . Right adrenal mass ([place]) 01/18/2015  . Chronic venous insufficiency 12/18/2014  . Advanced directives, counseling/discussion 04/02/2014  . Neuropathy due to peripheral vascular disease ([place]) 03/11/2014  . Chronic combined systolic and diastolic CHF, NYHA class 1 ([place])  03/06/2014  . Routine general medical examination at a health care facility 03/02/2013  . Breast cancer ([place]) 12/02/2011  . Constipation due to pain medication 10/28/2011  . T12 compression fracture ([place]) 10/28/2011  . Atherosclerotic heart disease of native coronary artery with angina pectoris ([place]) 06/05/2011  . Dyslipidemia 10/30/2006   . Episodic mood disorder ([place]) 10/30/2006  . COPD (chronic obstructive pulmonary disease) ([place]) 10/30/2006  . GERD 10/30/2006  . Osteoarthritis 10/30/2006  . OP (osteoporosis) 10/30/2006  . URINARY INCONTINENCE 10/30/2006    Past Surgical History:  Procedure Laterality Date  . ABDOMINAL HYSTERECTOMY    . APPENDECTOMY  1948  . BREAST IMPLANT REMOVAL  06/12/09   right  . BREAST RECONSTRUCTION  1998   Reconstruction   . CARDIAC CATHETERIZATION N/A 01/25/2015   Procedure: Left Heart Cath and Coronary Angiography;  Surgeon: Charolette Forward, MD;  Location: [facility];  Service: Cardiovascular;  Laterality: N/A;  . CORONARY ANGIOPLASTY  11/12   distal RCA  . FLEXIBLE SIGMOIDOSCOPY N/A 01/21/2015   Procedure: FLEXIBLE SIGMOIDOSCOPY;  Surgeon: Richmond Campbell, MD;  Location[facility] ENDOSCOPY;  Service: Endoscopy;  Laterality: N/A;  . FLEXIBLE SIGMOIDOSCOPY  01/22/2015      . FRACTURE SURGERY     fracture right elbow  . LEFT HEART CATH AND CORONARY ANGIOGRAPHY N/A 11/14/2016   Procedure: Left Heart Cath and Coronary Angiography;  Surgeon: Charolette Forward, MD;  Location: [facility];  Service: Cardiovascular;  Laterality: N/A;  . LEFT HEART CATHETERIZATION WITH CORONARY ANGIOGRAM N/A 06/05/2011   Procedure: LEFT HEART CATHETERIZATION WITH CORONARY ANGIOGRAM;  Surgeon: Clent Demark, MD;  Location: [facility] CATH LAB;  Service: Cardiovascular;  Laterality: N/A;  . LEFT HEART CATHETERIZATION WITH CORONARY ANGIOGRAM N/A 03/05/2014   Procedure: LEFT HEART CATHETERIZATION WITH CORONARY ANGIOGRAM;  Surgeon: Clent Demark, MD;  Location: [facility] C[facility] LAB;  Service: Cardiovascular;  Laterality: N/A;  . LUMBAR LAMINECTOMY  2003  . MASTECTOMY  1987   Right  . MASTECTOMY    . MASTOIDECTOMY  childhood  . REDUCTION MAMMAPLASTY  1998   Left  . SHOULDER SURGERY  02/2005   Bilateral fractures with multiple surgeries    OB History    No data available       Home Medications    Prior to Admission  medications   Medication Sig Start Date End Date Taking? Authorizing Provider  acetaminophen (TYLENOL) 500 MG tablet Take 1,000 mg by mouth every 8 (eight) hours.    [provider]  albuterol (PROVENTIL) (2.5 MG/3ML) 0.083% nebulizer solution Take 3 mLs (2.5 mg total) by nebulization every 6 (six) hours as needed for wheezing or shortness of breath. 02/18/15   Venia Carbon, MD  ALPRAZolam Duanne Moron) 0.25 MG tablet Take 0.5-1 tablets (0.125-0.25 mg total) by mouth 2 (two) times daily as needed for anxiety. 03/09/17   Venia Carbon, MD  aspirin 81 MG tablet Take 81 mg by mouth daily after lunch.     [provider]  atorvastatin (LIPITOR) 40 MG tablet Take 1 tablet (40 mg total) by mouth daily at 6 PM. 11/19/16   Charolette Forward, MD  bisacodyl (DULCOLAX) 5 MG EC tablet Take 2 tablets (10 mg total) by mouth daily as needed for moderate constipation. 11/19/16   Charolette Forward, MD  Calcium Carbonate-Vitamin D (CALTRATE 600+D PO) Take 1 tablet by mouth daily after lunch.     [provider]  carvedilol (COREG) 6.25 MG tablet Take 1 tablet (6.25 mg total) by  mouth 2 (two) times daily with a meal. Take 1 tablet by mouth two  times daily with a meal 11/19/16   Charolette Forward, MD  denosumab (PROLIA) 60 MG/ML SOLN injection Inject 60 mg into the skin every 6 (six) months. Reported on 11/05/2015    [provider]  diclofenac sodium (VOLTAREN) 1 % GEL Apply 2 g topically 2 (two) times daily. Apply to neck and to each shoulder    [provider]  escitalopram (LEXAPRO) 10 MG tablet Take 1 tablet (10 mg total) by mouth daily. 10/27/16   Venia Carbon, MD  famotidine (PEPCID) 20 MG tablet Take 1 tablet (20 mg total) by mouth 2 (two) times daily. 03/03/16   Venia Carbon, MD  fluticasone Asencion Islam) 50 MCG/ACT nasal spray Use 2 sprays in each  nostril daily 03/11/16   Venia Carbon, MD  furosemide (LASIX) 40 MG tablet Take 1 tablet (40 mg total) by mouth 2 (two)  times daily. Take two tabs BID with weight gain 11/19/16   Charolette Forward, MD  gabapentin (NEURONTIN) 100 MG capsule TAKE 1 CAPSULE BY MOUTH TWO TIMES DAILY 08/31/16   Venia Carbon, MD  losartan (COZAAR) 50 MG tablet TAKE 1 TABLET BY MOUTH  DAILY 01/19/17   [name] I, MD  Melatonin 3 MG TABS Take 1 tablet by mouth at bedtime.    [provider]  montelukast (SINGULAIR) 10 MG tablet TAKE 1 TABLET BY MOUTH  DAILY 03/09/17   Venia Carbon, MD  Multiple Vitamin (MULITIVITAMIN WITH MINERALS) TABS Take 1 tablet by mouth daily after lunch.     [provider]  nitroGLYCERIN (NITROSTAT) 0.4 MG SL tablet Place 1 tablet (0.4 mg total) under the tongue every 5 (five) minutes x 3 doses as needed for chest pain. 03/09/17   Venia Carbon, MD  PROAIR HFA 108 [phone] Base) MCG/ACT inhaler USE 2 PUFFS EVERY 6 HOURS AS NEEDED FOR WHEEZING OR SHORTNESS OF BREATH 02/13/16   [name] I, MD  spironolactone (ALDACTONE) 25 MG tablet TAKE 1 TABLET BY MOUTH  DAILY 08/10/16   Venia Carbon, MD  vitamin B-12 (CYANOCOBALAMIN) 500 MCG tablet Take 1,000 mcg by mouth 3 (three) times a week. Mon, Wed, Fri    [provider]    Family History Family History  Problem Relation Age of Onset  . Heart attack Father 80  . Cancer Mother        ovarian  . Cancer Brother        lung  . Arthritis Unknown        family    Social History Social History  Substance Use Topics  . Smoking status: Former Smoker    Quit date: 07/13/1974  . Smokeless tobacco: Never Used  . Alcohol use 0.0 oz/week     Comment: occasional     Allergies   Amoxicillin-pot clavulanate; Penicillins; Cephalexin; Clarithromycin; Lidocaine; Nifedipine; Nsaids; Olmesartan medoxomil; and Tramadol hcl   Review of Systems Review of Systems All other systems are reviewed and are negative for acute change except as noted in the HPI   Physical Exam Updated Vital Signs BP 120/70   Pulse 83   Temp (!) 97.2 F  (36.2 C) (Temporal)   Resp (!) 22   Ht 5\' 5"  (1.651 m)   Wt 79.4 kg (175 lb)   SpO2 94%   BMI 29.12 kg/m   Physical Exam  Constitutional: She is oriented to person, place, and time. She appears  well-developed and well-nourished. No distress.  HENT:  Head: Normocephalic and atraumatic.  Nose: Nose normal.  Eyes: Pupils are equal, round, and reactive to light. Conjunctivae and EOM are normal. Right eye exhibits no discharge. Left eye exhibits no discharge. No scleral icterus.  Neck: Normal range of motion. Neck supple.  Cardiovascular: Normal rate and regular rhythm.  Exam reveals no gallop and no friction rub.   No murmur heard. Pulmonary/Chest: Accessory muscle usage present. No stridor. Tachypnea noted. She is in respiratory distress. She has wheezes in the right upper field and the left upper field. She has no rales.  Poor air movement in by basilar regions.  S/P mastectomy and reconstructive surgery.  Abdominal: Soft. She exhibits no distension. There is no tenderness.  Musculoskeletal: She exhibits no edema or tenderness.  1+ bilateral lower extremity pitting edema  Neurological: She is alert and oriented to person, place, and time.  Skin: Skin is warm and dry. No rash noted. She is not diaphoretic. No erythema.  Psychiatric: She has a normal mood and affect.  Vitals reviewed.    ED Treatments / Results  Labs (all labs ordered are listed, but only abnormal results are displayed) Labs Reviewed  CBC WITH DIFFERENTIAL/PLATELET - Abnormal; Notable for the following:       Result Value   RBC 3.74 (*)    All other components within normal limits  BRAIN NATRIURETIC PEPTIDE - Abnormal; Notable for the following:    B Natriuretic Peptide 427.1 (*)    All other components within normal limits  COMPREHENSIVE METABOLIC PANEL - Abnormal; Notable for the following:    Sodium 128 (*)    Chloride 91 (*)    Glucose, Bld 174 (*)    Creatinine, Ser 1.05 (*)    Calcium 8.8 (*)    GFR  calc non Af Amer 47 (*)    GFR calc Af Amer 55 (*)    All other components within normal limits  I-STAT ARTERIAL BLOOD GAS, ED - Abnormal; Notable for the following:    pH, Arterial 7.466 (*)    pO2, Arterial 143.0 (*)    Bicarbonate 29.8 (*)    Acid-Base Excess 5.0 (*)    All other components within normal limits  BLOOD GAS, ARTERIAL  I-STAT TROPONIN, ED    EKG  EKG Interpretation None       Radiology Dg Chest Port 1 View  Result Date: 03/13/2017 CLINICAL DATA:  Worsening dyspnea with chest congestion and wheezing, onset this morning. EXAM: PORTABLE CHEST 1 VIEW COMPARISON:  7128 FINDINGS: Moderate cardiomegaly, unchanged. Left base consolidation. Small effusions. Mild diffuse interstitial prominence. IMPRESSION: The findings probably represent congestive heart failure with interstitial edema, small effusions and asymmetric alveolar edema. Cannot entirely exclude left base pneumonia. Electronically Signed   By: Andreas Newport M.D.   On: 03/13/2017 05:38    Procedures Procedures (including critical care time) CRITICAL CARE Performed by: Grayce Sessions Cardama Total critical care time: 30 minutes Critical care time was exclusive of separately billable procedures and treating other patients. Critical care was necessary to treat or prevent imminent or life-threatening deterioration. Critical care was time spent personally by me on the following activities: development of treatment plan with patient and/or surrogate as well as nursing, discussions with consultants, evaluation of patient's response to treatment, examination of patient, obtaining history from patient or surrogate, ordering and performing treatments and interventions, ordering and review of laboratory studies, ordering and review of radiographic studies, pulse oximetry and re-evaluation of patient's  condition.   Medications Ordered in ED Medications  albuterol (PROVENTIL,VENTOLIN) solution continuous neb (10 mg/hr  Nebulization New Bag/Given 03/13/17 0518)  furosemide (LASIX) injection 40 mg (not administered)  nitroGLYCERIN (NITROGLYN) 2 % ointment 1 inch (1 inch Topical Given 03/13/17 0504)  methylPREDNISolone sodium succinate (SOLU-MEDROL) 125 mg/2 mL injection 125 mg (125 mg Intravenous Given 03/13/17 0504)  ipratropium (ATROVENT) nebulizer solution 0.5 mg (0.5 mg Nebulization Given 03/13/17 0517)     Initial Impression / Assessment and Plan / ED Course  I have reviewed the triage vital signs and the nursing notes.  Pertinent labs & imaging results that were available during my care of the patient were reviewed by me and considered in my medical decision making (see chart for details).     Presentation consistent with CHF exacerbation with superimposed COPD exacerbation. Patient continued on BiPAP and given breathing treatments. Patient also given IV steroids.   Discussed case with hospitalist who will admit the patient for further management.  Final Clinical Impressions(s) / ED Diagnoses   Final diagnoses:  SOB (shortness of breath)  Acute on chronic combined systolic and diastolic congestive heart failure (HCC)  COPD exacerbation (HCC)      Cardama, Grayce Sessions, MD 03/13/17 [phone]

## 2017-03-14 LAB — BASIC METABOLIC PANEL
ANION GAP: 9 (ref 5–15)
BUN: 33 mg/dL — ABNORMAL HIGH (ref 6–20)
CHLORIDE: 91 mmol/L — AB (ref 101–111)
CO2: 28 mmol/L (ref 22–32)
Calcium: 8.4 mg/dL — ABNORMAL LOW (ref 8.9–10.3)
Creatinine, Ser: 1.31 mg/dL — ABNORMAL HIGH (ref 0.44–1.00)
GFR calc Af Amer: 42 mL/min — ABNORMAL LOW (ref >60)
GFR calc non Af Amer: 36 mL/min — ABNORMAL LOW (ref >60)
GLUCOSE: 149 mg/dL — AB (ref 65–99)
POTASSIUM: 4.9 mmol/L (ref 3.5–5.1)
Sodium: 128 mmol/L — ABNORMAL LOW (ref 135–145)

## 2017-03-14 LAB — CBC
HEMATOCRIT: 34 % — AB (ref 36.0–46.0)
Hemoglobin: 11.2 g/dL — ABNORMAL LOW (ref 12.0–15.0)
MCH: 32.1 pg (ref 26.0–34.0)
MCHC: 32.9 g/dL (ref 30.0–36.0)
MCV: 97.4 fL (ref 78.0–100.0)
Platelets: 183 10*3/uL (ref 150–400)
RBC: 3.49 MIL/uL — AB (ref 3.87–5.11)
RDW: 12.5 % (ref 11.5–15.5)
WBC: 10.7 10*3/uL — AB (ref 4.0–10.5)

## 2017-03-14 NOTE — Progress Notes (Signed)
PROGRESS NOTE  Haley Harris  BZJ:[id number] DOB: [birth year] DOA: 03/13/2017 PCP: Venia Carbon, MD Outpatient Specialists:  Subjective: Overall feeling better still has shortness of breath.back to baseline. She denies fever, denies any nausea or abdominal pain.  Brief Narrative:  Haley MERTZ is a 83 y.o. female with a past medical history significant for CHF EF 45%, CAD s/p PCI x2, HTN, COPD on home O2 now, and CKD III who presents with dyspnea. The patient was completely in her usual state of health until this morning at 4 AM when she woke unable to breathe and wheezing. Her home breathing treatments that were, so she called EMS. EMS found her with rales, administered sublingual nitroglycerin 5 and CPAP with some relief.   Assessment & Plan:   Principal Problem:   Acute on chronic respiratory failure with hypoxia (HCC) Active Problems:   COPD (chronic obstructive pulmonary disease) (HCC)   Atherosclerotic heart disease of native coronary artery with angina pectoris (HCC)   Hyponatremia   Chronic kidney disease, stage III (moderate)   Essential hypertension   Acute on chronic combined systolic and diastolic heart failure (HCC)   Benign hypertensive heart disease with CHF and with combined systolic and diastolic dysfunction, NYHA class 3 (HCC)   Acute on chronic systolic and diastolic CHF:  Edema on CXR, BNP elevated.  Weight at home recently has been normal, per patient, chart review shows she had complained of increased weight last week at PCP's office.  WELLS score 1.5, low risk, doubt PE.  No fever, sputum.   -Furosemide increased to 40 mg twice a day since yesterday. -Follow intake/output and renal function closely. -Start to ambulate, she needs walker with ambulation.  Hyponatremia:  Chronic, hypervolemic. -Daily BMP , stable since yesterday.  CKD III:  At baseline. -Close monitoring of renal function  Hypertension and CV disease secondary prevention:    -Continue statin, aspirin -Continue Aldactone, BB, losartan  COPD:  -Continue Singulair, allegra -Continue albuterol PRN  Other medications:  -Continue gabapentin -Continue iron -Continue Pepcid -Continue Lexapro     DVT prophylaxis: Lovenox Code Status: DNR Family Communication:  Disposition Plan:  Diet: Diet Heart Room service appropriate? Yes; Fluid consistency: Thin  Consultants:   None  Procedures:   None  Antimicrobials:   None   Objective: Vitals:   03/13/17 2100 03/13/17 2329 03/14/17 0555 03/14/17 1037  BP: 109/61 125/60 122/74 (!) 111/58  Pulse: 88 90 77 74  Resp: 20 18 18    Temp: 98.4 F (36.9 C) 98.2 F (36.8 C) 97.7 F (36.5 C)   TempSrc: Oral Oral Oral   SpO2: 100% 99% 100%   Weight:   81.1 kg (178 lb 14.4 oz)   Height:        Intake/Output Summary (Last 24 hours) at 03/14/17 1104 Last data filed at 03/14/17 0856  Gross per 24 hour  Intake              540 ml  Output              350 ml  Net              190 ml   Filed Weights   03/13/17 0447 03/13/17 1605 03/14/17 0555  Weight: 79.4 kg (175 lb) 80.9 kg (178 lb 4.8 oz) 81.1 kg (178 lb 14.4 oz)    Examination: General exam: Appears calm and comfortable  Respiratory system: Clear to auscultation. Respiratory effort normal. Cardiovascular system: S1 & S2 heard, RRR. No  JVD, murmurs, rubs, gallops or clicks. No pedal edema. Gastrointestinal system: Abdomen is nondistended, soft and nontender. No organomegaly or masses felt. Normal bowel sounds heard. Central nervous system: Alert and oriented. No focal neurological deficits. Extremities: Symmetric 5 x 5 power. Skin: No rashes, lesions or ulcers Psychiatry: Judgement and insight appear normal. Mood & affect appropriate.   Data Reviewed: I have personally reviewed following labs and imaging studies  CBC:  Recent Labs Lab 03/13/17 0453 03/14/17 0516  WBC 8.3 10.7*  NEUTROABS 5.8  --   HGB 12.0 11.2*  HCT 36.3 34.0*  MCV  97.1 97.4  PLT 210 854   Basic Metabolic Panel:  Recent Labs Lab 03/13/17 0453 03/13/17 0742 03/14/17 0516  NA 128*  --  128*  K 4.4  --  4.9  CL 91*  --  91*  CO2 26  --  28  GLUCOSE 174*  --  149*  BUN 19  --  33*  CREATININE 1.05*  --  1.31*  CALCIUM 8.8*  --  8.4*  MG  --  1.8  --    GFR: Estimated Creatinine Clearance: 34.1 mL/min (A) (by C-G formula based on SCr of 1.31 mg/dL (H)). Liver Function Tests:  Recent Labs Lab 03/13/17 0453  AST 25  ALT 14  ALKPHOS 55  BILITOT 0.9  PROT 6.6  ALBUMIN 3.7   No results for input(s): LIPASE, AMYLASE in the last 168 hours. No results for input(s): AMMONIA in the last 168 hours. Coagulation Profile: No results for input(s): INR, PROTIME in the last 168 hours. Cardiac Enzymes:  Recent Labs Lab 03/13/17 0742 03/13/17 1528  TROPONINI 0.04* 0.11*   BNP (last 3 results) No results for input(s): PROBNP in the last 8760 hours. HbA1C: No results for input(s): HGBA1C in the last 72 hours. CBG: No results for input(s): GLUCAP in the last 168 hours. Lipid Profile: No results for input(s): CHOL, HDL, LDLCALC, TRIG, CHOLHDL, LDLDIRECT in the last 72 hours. Thyroid Function Tests: No results for input(s): TSH, T4TOTAL, FREET4, T3FREE, THYROIDAB in the last 72 hours. Anemia Panel: No results for input(s): VITAMINB12, FOLATE, FERRITIN, TIBC, IRON, RETICCTPCT in the last 72 hours. Urine analysis:    Component Value Date/Time   COLORURINE YELLOW 10/29/2015 1849   APPEARANCEUR CLEAR 10/29/2015 1849   LABSPEC 1.012 10/29/2015 1849   PHURINE 6.0 10/29/2015 1849   GLUCOSEU NEGATIVE 10/29/2015 1849   HGBUR NEGATIVE 10/29/2015 1849   BILIRUBINUR NEGATIVE 10/29/2015 1849   BILIRUBINUR neg 08/05/2011 1119   KETONESUR NEGATIVE 10/29/2015 1849   PROTEINUR NEGATIVE 10/29/2015 1849   UROBILINOGEN 1.0 01/18/2015 1312   NITRITE NEGATIVE 10/29/2015 1849   LEUKOCYTESUR SMALL (A) 10/29/2015 1849   Sepsis  Labs: @LABRCNTIP (procalcitonin:4,lacticidven:4)  )No results found for this or any previous visit (from the past 240 hour(s)).   Invalid input(s): PROCALCITONIN, LACTICACIDVEN   Radiology Studies: Dg Chest Port 1 View  Result Date: 03/13/2017 CLINICAL DATA:  Worsening dyspnea with chest congestion and wheezing, onset this morning. EXAM: PORTABLE CHEST 1 VIEW COMPARISON:  7128 FINDINGS: Moderate cardiomegaly, unchanged. Left base consolidation. Small effusions. Mild diffuse interstitial prominence. IMPRESSION: The findings probably represent congestive heart failure with interstitial edema, small effusions and asymmetric alveolar edema. Cannot entirely exclude left base pneumonia. Electronically Signed   By: Andreas Newport M.D.   On: 03/13/2017 05:38        Scheduled Meds: . aspirin EC  81 mg Oral QPC lunch  . atorvastatin  40 mg Oral q1800  . carvedilol  6.25 mg Oral BID WC  . enoxaparin (LOVENOX) injection  40 mg Subcutaneous Q24H  . escitalopram  10 mg Oral Daily  . famotidine  20 mg Oral BID  . furosemide  40 mg Intravenous BID  . gabapentin  100 mg Oral BID  . Melatonin  1 tablet Oral QHS  . montelukast  10 mg Oral Daily  . potassium chloride  20 mEq Oral BID  . spironolactone  25 mg Oral Daily   Continuous Infusions:   LOS: 1 day    Time spent: 35 minutes    Birdie Hopes, MD [facility] Pager [phone]  If 7PM-7AM, please contact night-coverage [email or web address] Password [email or web address] 03/14/2017, 11:04 AM

## 2017-03-15 ENCOUNTER — Inpatient Hospital Stay (HOSPITAL_COMMUNITY): Payer: Medicare Other

## 2017-03-15 DIAGNOSIS — J441 Chronic obstructive pulmonary disease with (acute) exacerbation: Secondary | ICD-10-CM

## 2017-03-15 LAB — BASIC METABOLIC PANEL
Anion gap: 7 (ref 5–15)
BUN: 42 mg/dL — AB (ref 6–20)
CHLORIDE: 92 mmol/L — AB (ref 101–111)
CO2: 30 mmol/L (ref 22–32)
CREATININE: 1.53 mg/dL — AB (ref 0.44–1.00)
Calcium: 8.4 mg/dL — ABNORMAL LOW (ref 8.9–10.3)
GFR calc Af Amer: 35 mL/min — ABNORMAL LOW (ref >60)
GFR calc non Af Amer: 30 mL/min — ABNORMAL LOW (ref >60)
GLUCOSE: 108 mg/dL — AB (ref 65–99)
POTASSIUM: 4.8 mmol/L (ref 3.5–5.1)
Sodium: 129 mmol/L — ABNORMAL LOW (ref 135–145)

## 2017-03-15 LAB — CBC
HEMATOCRIT: 35.9 % — AB (ref 36.0–46.0)
Hemoglobin: 11.7 g/dL — ABNORMAL LOW (ref 12.0–15.0)
MCH: 32.9 pg (ref 26.0–34.0)
MCHC: 32.6 g/dL (ref 30.0–36.0)
MCV: 100.8 fL — AB (ref 78.0–100.0)
PLATELETS: 184 10*3/uL (ref 150–400)
RBC: 3.56 MIL/uL — ABNORMAL LOW (ref 3.87–5.11)
RDW: 12.7 % (ref 11.5–15.5)
WBC: 9.2 10*3/uL (ref 4.0–10.5)

## 2017-03-15 MED ORDER — FAMOTIDINE 20 MG PO TABS
20.0000 mg | ORAL_TABLET | Freq: Every day | ORAL | Status: DC
  Administered 2017-03-16: 20 mg via ORAL
  Filled 2017-03-15: qty 1

## 2017-03-15 NOTE — Evaluation (Signed)
Occupational Therapy Evaluation Patient Details Name: Haley Harris MRN: [id number] DOB: July 17, 1928 Today's Date: 03/15/2017    History of Present Illness Pt admitted with acute on chronic respiratory failure with hypoxia. PMH: CHF, CAD, HTN, COPD on home 02, CKD.   Clinical Impression   Pt is functioning at a modified independent level in ADL and mobility within her room. She has all necessary DME and AE at home and is aware of breathing and energy conservation techniques. No OT needs.    Follow Up Recommendations  No OT follow up (pt wants HHPT and an aide)    Equipment Recommendations  None recommended by OT    Recommendations for Other Services       Precautions / Restrictions Precautions Precautions: Fall Precaution Comments: uses a rollator at home Restrictions Weight Bearing Restrictions: No      Mobility Bed Mobility Overal bed mobility: Modified Independent                Transfers Overall transfer level: Modified independent Equipment used: Rolling walker (2 wheeled)                  Balance                                           ADL either performed or assessed with clinical judgement   ADL Overall ADL's : At baseline;Modified independent                                             Vision Baseline Vision/History: No visual deficits Patient Visual Report: No change from baseline       Perception     Praxis      Pertinent Vitals/Pain Pain Assessment: No/denies pain     Hand Dominance Right   Extremity/Trunk Assessment Upper Extremity Assessment Upper Extremity Assessment: Overall WFL for tasks assessed   Lower Extremity Assessment Lower Extremity Assessment: Defer to PT evaluation   Cervical / Trunk Assessment Cervical / Trunk Assessment: Kyphotic   Communication Communication Communication: No difficulties   Cognition Arousal/Alertness: Awake/alert Behavior During Therapy: WFL for  tasks assessed/performed Overall Cognitive Status: Within Functional Limits for tasks assessed                                     General Comments       Exercises     Shoulder Instructions      Home Living Family/patient expects to be discharged to:: Private residence Living Arrangements: Children Available Help at Discharge: Family;Available 24 hours/day Type of Home: House Home Access: Stairs to enter   Entrance Stairs-Rails: Can reach both;Left;Right Home Layout: Two level;Able to live on main level with bedroom/bathroom     Bathroom Shower/Tub: Hospital doctor Toilet: Handicapped height     Home Equipment: Environmental consultant - 4 wheels;Shower seat;Transport chair;Toilet riser;Hand held shower head;Adaptive equipment Adaptive Equipment: Reacher;Sock aid;Long-handled shoe horn;Long-handled sponge        Prior Functioning/Environment Level of Independence: Needs assistance  Gait / Transfers Assistance Needed: ambulates with rollator ADL's / Homemaking Assistance Needed: mod I with ADL, daughter assists with IADL   Comments: Pt leaves home to go out to  eat, rides while her friend delivers mobile meals, goes to church        OT Problem List: Cardiopulmonary status limiting activity      OT Treatment/Interventions:      OT Goals(Current goals can be found in the care plan section) Acute Rehab OT Goals Patient Stated Goal: to return home  OT Frequency:     Barriers to D/C:            Co-evaluation              AM-PAC PT "6 Clicks" Daily Activity     Outcome Measure Help from another person eating meals?: None Help from another person taking care of personal grooming?: None Help from another person toileting, which includes using toliet, bedpan, or urinal?: None Help from another person bathing (including washing, rinsing, drying)?: None Help from another person to put on and taking off regular upper body clothing?: None Help from  another person to put on and taking off regular lower body clothing?: None 6 Click Score: 24   End of Session Equipment Utilized During Treatment: Rolling walker;Oxygen Nurse Communication: Mobility status  Activity Tolerance: Patient tolerated treatment well Patient left: in chair;with call bell/phone within reach  OT Visit Diagnosis: Unsteadiness on feet (R26.81)                Time: 2248-2500 OT Time Calculation (min): 35 min Charges:  OT General Charges $OT Visit: 1 Visit OT Evaluation $OT Eval Low Complexity: 1 Low OT Treatments $Self Care/Home Management : 8-22 mins G-Codes:      [name] 03/15/2017, 10:02 AM  03/15/2017 Nestor Lewandowsky, OTR/L Pager: [phone]

## 2017-03-15 NOTE — Progress Notes (Signed)
MEDICATION RELATED NOTE    Pharmacy Re:  Famotidine Indication: Renal dose adjustment  Assessment: Patients with worsening creatinine and an est crcl  57ml/min.  She is on Pepcid 20mg  bid and without acute exacerbation noted for her GERD.    Plan:  - Decreased dose currently to 20mg  daily - consider dose titration back up if renal function improves and crcl > 8ml/min.  Rober Minion, PharmD., MS Clinical Pharmacist Pager:  [phone] Thank you for allowing pharmacy to be part of this patients care team. 03/15/2017,2:44 PM

## 2017-03-15 NOTE — Progress Notes (Signed)
Call from Dr Hartford Poli, requests updates on patient's UOP; especially if it does not increase.

## 2017-03-15 NOTE — Progress Notes (Signed)
PROGRESS NOTE  Haley Harris  HGD:[id number] DOB: Jan 28, 1929 DOA: 03/13/2017 PCP: Venia Carbon, MD Outpatient Specialists:  Subjective: She was upset this morning that her weight is up by 5 pounds. She has 1100 urine output from yesterday, still receiving Lasix 40 mg twice a day. Overall with her breathing she is feeling better  Brief Narrative:  Haley Harris is a 83 y.o. female with a past medical history significant for CHF EF 45%, CAD s/p PCI x2, HTN, COPD on home O2 now, and CKD III who presents with dyspnea. The patient was completely in her usual state of health until this morning at 4 AM when she woke unable to breathe and wheezing. Her home breathing treatments that were, so she called EMS. EMS found her with rales, administered sublingual nitroglycerin 5 and CPAP with some relief.   Assessment & Plan:   Principal Problem:   Acute on chronic respiratory failure with hypoxia (HCC) Active Problems:   COPD (chronic obstructive pulmonary disease) (HCC)   Atherosclerotic heart disease of native coronary artery with angina pectoris (HCC)   Hyponatremia   Chronic kidney disease, stage III (moderate)   Essential hypertension   Acute on chronic combined systolic and diastolic heart failure (HCC)   Benign hypertensive heart disease with CHF and with combined systolic and diastolic dysfunction, NYHA class 3 (HCC)   Acute on chronic systolic and diastolic CHF:  Edema on CXR, BNP elevated.  Weight at home recently has been normal, per patient, chart review shows she had complained of increased weight last week at PCP's office.  WELLS score 1.5, low risk, doubt PE.  No fever, sputum.   -Furosemide increased to 40 mg twice a day since yesterday. -Follow intake/output and renal function closely. -Start to ambulate, she needs walker with ambulation.  Hyponatremia:  Chronic, hypervolemic. -Daily BMP , stable since yesterday.  CKD III:  At baseline. -Close monitoring of renal  function  Hypertension and CV disease secondary prevention:  -Continue statin, aspirin -Continue Aldactone, BB, losartan  COPD:  -Continue Singulair, allegra -Continue albuterol PRN  Other medications:  -Continue gabapentin -Continue iron -Continue Pepcid -Continue Lexapro    DVT prophylaxis: Lovenox Code Status: DNR Family Communication:  Disposition Plan:  Diet: Diet Heart Room service appropriate? Yes; Fluid consistency: Thin  Consultants:   None  Procedures:   None  Antimicrobials:   None   Objective: Vitals:   03/14/17 2130 03/15/17 0523 03/15/17 0920 03/15/17 1155  BP: (!) 112/57 138/65 (!) 116/52 (!) 125/58  Pulse: 71 73 68 65  Resp: 20 20  20   Temp: 98.6 F (37 C) 97.7 F (36.5 C)  99 F (37.2 C)  TempSrc: Oral Oral  Oral  SpO2: 98% 100%  95%  Weight:  81.7 kg (180 lb 3.2 oz)    Height:        Intake/Output Summary (Last 24 hours) at 03/15/17 1230 Last data filed at 03/15/17 0855  Gross per 24 hour  Intake             1200 ml  Output             1100 ml  Net              100 ml   Filed Weights   03/13/17 1605 03/14/17 0555 03/15/17 0523  Weight: 80.9 kg (178 lb 4.8 oz) 81.1 kg (178 lb 14.4 oz) 81.7 kg (180 lb 3.2 oz)    Examination: General exam: Appears calm and comfortable  Respiratory system: Clear to auscultation. Respiratory effort normal. Cardiovascular system: S1 & S2 heard, RRR. No JVD, murmurs, rubs, gallops or clicks. No pedal edema. Gastrointestinal system: Abdomen is nondistended, soft and nontender. No organomegaly or masses felt. Normal bowel sounds heard. Central nervous system: Alert and oriented. No focal neurological deficits. Extremities: Symmetric 5 x 5 power. Skin: No rashes, lesions or ulcers Psychiatry: Judgement and insight appear normal. Mood & affect appropriate.   Data Reviewed: I have personally reviewed following labs and imaging studies  CBC:  Recent Labs Lab 03/13/17 0453 03/14/17 0516  03/15/17 0334  WBC 8.3 10.7* 9.2  NEUTROABS 5.8  --   --   HGB 12.0 11.2* 11.7*  HCT 36.3 34.0* 35.9*  MCV 97.1 97.4 100.8*  PLT 210 183 836   Basic Metabolic Panel:  Recent Labs Lab 03/13/17 0453 03/13/17 0742 03/14/17 0516 03/15/17 0334  NA 128*  --  128* 129*  K 4.4  --  4.9 4.8  CL 91*  --  91* 92*  CO2 26  --  28 30  GLUCOSE 174*  --  149* 108*  BUN 19  --  33* 42*  CREATININE 1.05*  --  1.31* 1.53*  CALCIUM 8.8*  --  8.4* 8.4*  MG  --  1.8  --   --    GFR: Estimated Creatinine Clearance: 29.3 mL/min (A) (by C-G formula based on SCr of 1.53 mg/dL (H)). Liver Function Tests:  Recent Labs Lab 03/13/17 0453  AST 25  ALT 14  ALKPHOS 55  BILITOT 0.9  PROT 6.6  ALBUMIN 3.7   No results for input(s): LIPASE, AMYLASE in the last 168 hours. No results for input(s): AMMONIA in the last 168 hours. Coagulation Profile: No results for input(s): INR, PROTIME in the last 168 hours. Cardiac Enzymes:  Recent Labs Lab 03/13/17 0742 03/13/17 1528  TROPONINI 0.04* 0.11*   BNP (last 3 results) No results for input(s): PROBNP in the last 8760 hours. HbA1C: No results for input(s): HGBA1C in the last 72 hours. CBG: No results for input(s): GLUCAP in the last 168 hours. Lipid Profile: No results for input(s): CHOL, HDL, LDLCALC, TRIG, CHOLHDL, LDLDIRECT in the last 72 hours. Thyroid Function Tests: No results for input(s): TSH, T4TOTAL, FREET4, T3FREE, THYROIDAB in the last 72 hours. Anemia Panel: No results for input(s): VITAMINB12, FOLATE, FERRITIN, TIBC, IRON, RETICCTPCT in the last 72 hours. Urine analysis:    Component Value Date/Time   COLORURINE YELLOW 10/29/2015 1849   APPEARANCEUR CLEAR 10/29/2015 1849   LABSPEC 1.012 10/29/2015 1849   PHURINE 6.0 10/29/2015 1849   GLUCOSEU NEGATIVE 10/29/2015 1849   HGBUR NEGATIVE 10/29/2015 1849   BILIRUBINUR NEGATIVE 10/29/2015 1849   BILIRUBINUR neg 08/05/2011 1119   KETONESUR NEGATIVE 10/29/2015 1849   PROTEINUR  NEGATIVE 10/29/2015 1849   UROBILINOGEN 1.0 01/18/2015 1312   NITRITE NEGATIVE 10/29/2015 1849   LEUKOCYTESUR SMALL (A) 10/29/2015 1849   Sepsis Labs: @LABRCNTIP (procalcitonin:4,lacticidven:4)  )No results found for this or any previous visit (from the past 240 hour(s)).   Invalid input(s): PROCALCITONIN, [place]   Radiology Studies: No results found.      Scheduled Meds: . aspirin EC  81 mg Oral QPC lunch  . atorvastatin  40 mg Oral q1800  . carvedilol  6.25 mg Oral BID WC  . enoxaparin (LOVENOX) injection  40 mg Subcutaneous Q24H  . escitalopram  10 mg Oral Daily  . famotidine  20 mg Oral BID  . furosemide  40 mg Intravenous BID  .  gabapentin  100 mg Oral BID  . Melatonin  1 tablet Oral QHS  . montelukast  10 mg Oral Daily  . potassium chloride  20 mEq Oral BID  . spironolactone  25 mg Oral Daily   Continuous Infusions:   LOS: 2 days    Time spent: 35 minutes    Birdie Hopes, MD [facility] Pager [phone]  If 7PM-7AM, please contact night-coverage [email or web address] Password [email or web address] 03/15/2017, 12:30 PM

## 2017-03-15 NOTE — Evaluation (Signed)
Physical Therapy Evaluation Patient Details Name: Haley Harris MRN: [id number] DOB: 03-19-1929 Today's Date: 03/15/2017   History of Present Illness  Pt admitted with acute on chronic respiratory failure with hypoxia. PMH: CHF, CAD, HTN, COPD on home 02, CKD.  Clinical Impression  Pt admitted secondary to problem above with deficits below. PTA, pt was independent with functional mobility using a rollator. Pt on home O2; 2L with mobility and at night. Upon eval, pt presenting with decreased strength and mildly decreased balance. Gait distance limited this session secondary to decreased cardiopulmonary endurance, however, pt reports that is better than she walked earlier. Pt reports she just got done with HHPT a couple of weeks ago following a rehab stay from admission in May. Do not feel pt will need any follow up services at this time and pt has all necessary DME at home. Will continue to follow acutely to maximize functional mobility independence and safety.     Follow Up Recommendations No PT follow up;Supervision for mobility/OOB    Equipment Recommendations  None recommended by PT (has all necessary DME)    Recommendations for Other Services       Precautions / Restrictions Precautions Precautions: Fall Precaution Comments: uses a rollator at home Restrictions Weight Bearing Restrictions: No      Mobility  Bed Mobility               General bed mobility comments: In chair upon entry   Transfers Overall transfer level: Modified independent Equipment used: Rolling walker (2 wheeled)             General transfer comment: Increased time required, however, no external assist. Demonstrated safe hand placement.   Ambulation/Gait Ambulation/Gait assistance: Min guard;Supervision Ambulation Distance (Feet): 100 Feet Assistive device: Rolling walker (2 wheeled) Gait Pattern/deviations: Step-through pattern;Decreased stride length;Trunk flexed Gait velocity:  Decreased Gait velocity interpretation: Below normal speed for age/gender General Gait Details: Slow, slightly unsteady gait, however, no overt LOB noted. Required verbal cues for upright posture and to look ahead. Practiced horizontal head turns during gait and pt with mild instability; no assist required, however.   Stairs            Wheelchair Mobility    Modified Rankin (Stroke Patients Only)       Balance Overall balance assessment: Needs assistance Sitting-balance support: No upper extremity supported;Feet supported Sitting balance-Leahy Scale: Good     Standing balance support: Bilateral upper extremity supported;No upper extremity supported;During functional activity Standing balance-Leahy Scale: Fair Standing balance comment: Able to stand at sink to wash hands without UE support                              Pertinent Vitals/Pain Pain Assessment: No/denies pain    Home Living Family/patient expects to be discharged to:: Private residence Living Arrangements: Children Available Help at Discharge: Family;Available 24 hours/day Type of Home: House Home Access: Stairs to enter Entrance Stairs-Rails: Can reach both;Left;Right Entrance Stairs-Number of Steps: 2 Home Layout: Two level;Able to live on main level with bedroom/bathroom Home Equipment: Walker - 4 wheels;Shower seat;Transport chair;Toilet riser;Hand held shower head;Adaptive equipment      Prior Function Level of Independence: Needs assistance   Gait / Transfers Assistance Needed: ambulates with rollator  ADL's / Homemaking Assistance Needed: mod I with ADL, daughter assists with IADL  Comments: Pt leaves home to go out to eat, rides while her friend delivers mobile meals, goes to  church     Hand Dominance   Dominant Hand: Right    Extremity/Trunk Assessment   Upper Extremity Assessment Upper Extremity Assessment: Defer to OT evaluation    Lower Extremity Assessment Lower  Extremity Assessment: Generalized weakness;RLE deficits/detail;LLE deficits/detail RLE Deficits / Details: Reports knee pain at baseline  LLE Deficits / Details: Reports knee pain at baseline     Cervical / Trunk Assessment Cervical / Trunk Assessment: Kyphotic  Communication   Communication: No difficulties  Cognition Arousal/Alertness: Awake/alert Behavior During Therapy: WFL for tasks assessed/performed Overall Cognitive Status: Within Functional Limits for tasks assessed                                        General Comments General comments (skin integrity, edema, etc.): Pt reports she just finished up with HHPT a couple of weeks ago. Spend a few weeks in rehab following admission in May. Feels like she has been doing much better since then. Educated about energy conservation and walking exercise program to perform at home.     Exercises     Assessment/Plan    PT Assessment Patient needs continued PT services  PT Problem List Decreased strength;Decreased balance;Decreased mobility;Cardiopulmonary status limiting activity       PT Treatment Interventions DME instruction;Gait training;Stair training;Functional mobility training;Therapeutic activities;Therapeutic exercise;Balance training;Neuromuscular re-education;Patient/family education    PT Goals (Current goals can be found in the Care Plan section)  Acute Rehab PT Goals Patient Stated Goal: to return home PT Goal Formulation: With patient Time For Goal Achievement: 03/22/17 Potential to Achieve Goals: Good    Frequency Min 3X/week   Barriers to discharge        Co-evaluation               AM-PAC PT "6 Clicks" Daily Activity  Outcome Measure Difficulty turning over in bed (including adjusting bedclothes, sheets and blankets)?: A Little Difficulty moving from lying on back to sitting on the side of the bed? : A Little Difficulty sitting down on and standing up from a chair with arms (e.g.,  wheelchair, bedside commode, etc,.)?: A Little Help needed moving to and from a bed to chair (including a wheelchair)?: A Little Help needed walking in hospital room?: A Little Help needed climbing 3-5 steps with a railing? : A Little 6 Click Score: 18    End of Session Equipment Utilized During Treatment: Gait belt;Oxygen Activity Tolerance: Patient tolerated treatment well Patient left: in chair;with call bell/phone within reach Nurse Communication: Mobility status PT Visit Diagnosis: Unsteadiness on feet (R26.81);Other abnormalities of gait and mobility (R26.89)    Time: 9242-6834 PT Time Calculation (min) (ACUTE ONLY): 23 min   Charges:   PT Evaluation $PT Eval Low Complexity: 1 Low PT Treatments $Gait Training: 8-22 mins   PT G Codes:        Leighton Ruff, PT, DPT  Acute Rehabilitation Services  Pager: [phone]   Rudean Hitt 03/15/2017, 11:37 AM

## 2017-03-15 NOTE — Care Management Note (Signed)
Case Management Note  Patient Details  Name: NAJMO PARDUE MRN: [id number] Date of Birth: Jun 18, 1929  Subjective/Objective:      Acute on Chronic Resp Failure             Action/Plan: Patient lives at home; PCP: Venia Carbon, MD; has private insurance with [facility] with prescription drug coverage; DME- home oxygen, nebulizer machine at home; CM will continue to follow for DCP.  Expected Discharge Date:   possibly 03/19/2017               Expected Discharge Plan:  [place]  possibly In-House Referral:   [facility]  Discharge planning Services  CM Consult    Status of Service:  In process, will continue to follow  Sherrilyn Rist [phone] 03/15/2017, 9:40 AM

## 2017-03-16 LAB — BASIC METABOLIC PANEL
ANION GAP: 7 (ref 5–15)
BUN: 38 mg/dL — ABNORMAL HIGH (ref 6–20)
CHLORIDE: 93 mmol/L — AB (ref 101–111)
CO2: 30 mmol/L (ref 22–32)
Calcium: 8.6 mg/dL — ABNORMAL LOW (ref 8.9–10.3)
Creatinine, Ser: 1.33 mg/dL — ABNORMAL HIGH (ref 0.44–1.00)
GFR, EST AFRICAN AMERICAN: 41 mL/min — AB (ref >60)
GFR, EST NON AFRICAN AMERICAN: 35 mL/min — AB (ref >60)
Glucose, Bld: 90 mg/dL (ref 65–99)
POTASSIUM: 4.9 mmol/L (ref 3.5–5.1)
SODIUM: 130 mmol/L — AB (ref 135–145)

## 2017-03-16 NOTE — Progress Notes (Signed)
Offered pt a bath and pt stated that she is going home, she will take her shower when she gets there.

## 2017-03-16 NOTE — Discharge Summary (Signed)
Physician Discharge Summary  Haley Harris FBP:[id number] DOB: 12/16/1928 DOA: 03/13/2017  PCP: Venia Carbon, MD  Admit date: 03/13/2017 Discharge date: 03/16/2017  Admitted From: Home Disposition: Home  Recommendations for Outpatient Follow-up:  1. Follow up with PCP in 1-2 weeks 2. Please obtain BMP/CBC in one week  Home Health: NA Equipment/Devices:NA  Discharge Condition: Stable CODE STATUS: DNR Diet recommendation: Diet Heart Room service appropriate? Yes; Fluid consistency: Thin Diet - low sodium heart healthy  Brief/Interim Summary: Haley E Kelsois a 83 y.o.femalewith a past medical history significant for CHF EF 45%, CAD s/p PCI x2, HTN, COPD on home O2 now, and CKD IIIwho presents with dyspnea. The patient was completelyin her usual state of health until this morning at 4 AM when she woke unable to breathe and wheezing. Her home breathing treatments that were, so she called EMS. EMS found her with rales, administered sublingual nitroglycerin 5 and CPAP with some relief.  Discharge Diagnoses:  Principal Problem:   Acute on chronic respiratory failure with hypoxia (HCC) Active Problems:   COPD (chronic obstructive pulmonary disease) (HCC)   Atherosclerotic heart disease of native coronary artery with angina pectoris (HCC)   Hyponatremia   Chronic kidney disease, stage III (moderate)   Essential hypertension   Acute on chronic combined systolic and diastolic heart failure (HCC)   Benign hypertensive heart disease with CHF and with combined systolic and diastolic dysfunction, NYHA class 3 (HCC)    Acute on chronic systolic and diastolic CHF: -Edema on CXR, BNP elevated. Weight at home recently has been normal, per patient, chart review shows she had complained of increased weight last week at PCP's office. WELLS score 1.5, low risk, doubt PE. No fever, sputum.  -Diuresed with IV Lasix 40 mg twice a day -Good urine output, stable renal function. -Not requiring  oxygen now, repeat chest x-ray showed improvement of CHF findings. -Discharged home on her regular home medications to follow-up with her cardiologist next week.  Hyponatremia: Chronic, hypervolemic. -Daily BMP, stable since yesterday.  CKD III: At baseline. She is at her baseline of creatinine of 1.3  Hypertension and CV disease secondary prevention: -Continue statin, aspirin -Continue Aldactone, BB, losartan  COPD: -Continue Singulair, allegra -Continue albuterol PRN  Other medications: -Continue gabapentin -Continue iron -Continue Pepcid -Continue Lexapro   Discharge Instructions  Discharge Instructions    Diet - low sodium heart healthy    Complete by:  As directed    Increase activity slowly    Complete by:  As directed      Allergies as of 03/16/2017      Reactions   Amoxicillin-pot Clavulanate Anaphylaxis   Penicillins Anaphylaxis   Cephalexin Other (See Comments)   Reaction unknown   Clarithromycin Other (See Comments)   Reaction unknown   Lidocaine    Patient had tremors following use of lidocaine pain patches   Nifedipine Other (See Comments)   Reaction unknown   Nsaids Other (See Comments)   Heart issue   Olmesartan Medoxomil    REACTION: cough;  But tolerating Losartan 02/2014   Tramadol Hcl Other (See Comments)   loopy      Medication List    TAKE these medications   acetaminophen 500 MG tablet Commonly known as:  TYLENOL Take 1,000 mg by mouth every 8 (eight) hours as needed for mild pain.   albuterol (2.5 MG/3ML) 0.083% nebulizer solution Commonly known as:  PROVENTIL Take 3 mLs (2.5 mg total) by nebulization every 6 (six) hours as needed  for wheezing or shortness of breath.   PROAIR HFA 108 (90 Base) MCG/ACT inhaler Generic drug:  albuterol USE 2 PUFFS EVERY 6 HOURS AS NEEDED FOR WHEEZING OR SHORTNESS OF BREATH   ALPRAZolam 0.25 MG tablet Commonly known as:  XANAX Take 0.5-1 tablets (0.125-0.25 mg total) by mouth 2 (two)  times daily as needed for anxiety.   aspirin 81 MG tablet Take 81 mg by mouth every evening.   atorvastatin 40 MG tablet Commonly known as:  LIPITOR Take 1 tablet (40 mg total) by mouth daily at 6 PM.   bisacodyl 5 MG EC tablet Commonly known as:  DULCOLAX Take 2 tablets (10 mg total) by mouth daily as needed for moderate constipation.   CALTRATE 600+D PO Take 1 tablet by mouth daily after lunch.   carvedilol 6.25 MG tablet Commonly known as:  COREG Take 1 tablet (6.25 mg total) by mouth 2 (two) times daily with a meal. Take 1 tablet by mouth two  times daily with a meal   denosumab 60 MG/ML Soln injection Commonly known as:  PROLIA Inject 60 mg into the skin every 6 (six) months.   escitalopram 10 MG tablet Commonly known as:  LEXAPRO Take 1 tablet (10 mg total) by mouth daily.   famotidine 20 MG tablet Commonly known as:  PEPCID Take 1 tablet (20 mg total) by mouth 2 (two) times daily.   fluticasone 50 MCG/ACT nasal spray Commonly known as:  FLONASE Use 2 sprays in each  nostril daily   furosemide 40 MG tablet Commonly known as:  LASIX Take 1 tablet (40 mg total) by mouth 2 (two) times daily. Take two tabs BID with weight gain   gabapentin 100 MG capsule Commonly known as:  NEURONTIN TAKE 1 CAPSULE BY MOUTH TWO TIMES DAILY   losartan 50 MG tablet Commonly known as:  COZAAR TAKE 1 TABLET BY MOUTH  DAILY   montelukast 10 MG tablet Commonly known as:  SINGULAIR TAKE 1 TABLET BY MOUTH  DAILY   multivitamin with minerals Tabs tablet Take 1 tablet by mouth daily after lunch.   nitroGLYCERIN 0.4 MG SL tablet Commonly known as:  NITROSTAT Place 1 tablet (0.4 mg total) under the tongue every 5 (five) minutes x 3 doses as needed for chest pain.   spironolactone 25 MG tablet Commonly known as:  ALDACTONE TAKE 1 TABLET BY MOUTH  DAILY   vitamin B-12 500 MCG tablet Commonly known as:  CYANOCOBALAMIN Take 1,000 mcg by mouth 3 (three) times a week. Jory Sims, Fri             Discharge Care Instructions        Start     Ordered   03/16/17 0000  Increase activity slowly     03/16/17 1119   03/16/17 0000  Diet - low sodium heart healthy     03/16/17 1119      Allergies  Allergen Reactions  . Amoxicillin-Pot Clavulanate Anaphylaxis  . Penicillins Anaphylaxis  . Cephalexin Other (See Comments)    Reaction unknown  . Clarithromycin Other (See Comments)    Reaction unknown  . Lidocaine     Patient had tremors following use of lidocaine pain patches  . Nifedipine Other (See Comments)    Reaction unknown  . Nsaids Other (See Comments)    Heart issue  . Olmesartan Medoxomil     REACTION: cough;  But tolerating Losartan 02/2014  . Tramadol Hcl Other (See Comments)    loopy  Consultations:  None  Procedures (Echo, Carotid, EGD, Colonoscopy, ERCP)   Radiological studies: Dg Chest 2 View  Result Date: 03/15/2017 CLINICAL DATA:  Admitted for shortness of breath and congestive heart failure on 03/13/2017. The shortness of breath has resolved. EXAM: CHEST  2 VIEW COMPARISON:  03/13/2017. FINDINGS: Mildly enlarged cardiac silhouette without significant change. Increased linear density in the right mid to lower lung zone and at the left lung base. Decreased prominence of the interstitial markings. Diffuse osteopenia. Small left pleural effusion, decreased. Old, healed bilateral humeral neck fractures. Multiple thoracic vertebral compression deformities without significant change since 01/21/2017. IMPRESSION: 1. Improved changes of congestive heart failure. 2. Mildly increased bibasilar atelectasis. Electronically Signed   By: Claudie Revering M.D.   On: 03/15/2017 14:18   Dg Chest Port 1 View  Result Date: 03/13/2017 CLINICAL DATA:  Worsening dyspnea with chest congestion and wheezing, onset this morning. EXAM: PORTABLE CHEST 1 VIEW COMPARISON:  [id number] FINDINGS: Moderate cardiomegaly, unchanged. Left base consolidation. Small effusions. Mild diffuse  interstitial prominence. IMPRESSION: The findings probably represent congestive heart failure with interstitial edema, small effusions and asymmetric alveolar edema. Cannot entirely exclude left base pneumonia. Electronically Signed   By: Andreas Newport M.D.   On: 03/13/2017 05:38     Subjective:  Discharge Exam: Vitals:   03/15/17 0920 03/15/17 1155 03/15/17 2040 03/16/17 0624  BP: (!) 116/52 (!) 125/58 130/60 (!) 128/58  Pulse: 68 65 72 70  Resp:  20 18 18   Temp:  99 F (37.2 C) 98.7 F (37.1 C) 98.2 F (36.8 C)  TempSrc:  Oral Oral Oral  SpO2:  95% 98% 95%  Weight:    80.9 kg (178 lb 5.6 oz)  Height:       General: Pt is alert, awake, not in acute distress Cardiovascular: RRR, S1/S2 +, no rubs, no gallops Respiratory: CTA bilaterally, no wheezing, no rhonchi Abdominal: Soft, NT, ND, bowel sounds + Extremities: no edema, no cyanosis   The results of significant diagnostics from this hospitalization (including imaging, microbiology, ancillary and laboratory) are listed below for reference.    Microbiology: No results found for this or any previous visit (from the past 240 hour(s)).   Labs: BNP (last 3 results)  Recent Labs  11/14/16 1310 03/13/17 0453  BNP 386.5* 660.6*   Basic Metabolic Panel:  Recent Labs Lab 03/13/17 0453 03/13/17 0742 03/14/17 0516 03/15/17 0334 03/16/17 0614  NA 128*  --  128* 129* 130*  K 4.4  --  4.9 4.8 4.9  CL 91*  --  91* 92* 93*  CO2 26  --  28 30 30   GLUCOSE 174*  --  149* 108* 90  BUN 19  --  33* 42* 38*  CREATININE 1.05*  --  1.31* 1.53* 1.33*  CALCIUM 8.8*  --  8.4* 8.4* 8.6*  MG  --  1.8  --   --   --    Liver Function Tests:  Recent Labs Lab 03/13/17 0453  AST 25  ALT 14  ALKPHOS 55  BILITOT 0.9  PROT 6.6  ALBUMIN 3.7   No results for input(s): LIPASE, AMYLASE in the last 168 hours. No results for input(s): AMMONIA in the last 168 hours. CBC:  Recent Labs Lab 03/13/17 0453 03/14/17 0516  03/15/17 0334  WBC 8.3 10.7* 9.2  NEUTROABS 5.8  --   --   HGB 12.0 11.2* 11.7*  HCT 36.3 34.0* 35.9*  MCV 97.1 97.4 100.8*  PLT 210 183 184  Cardiac Enzymes:  Recent Labs Lab 03/13/17 0742 03/13/17 1528  TROPONINI 0.04* 0.11*   BNP: Invalid input(s): POCBNP CBG: No results for input(s): GLUCAP in the last 168 hours. D-Dimer No results for input(s): DDIMER in the last 72 hours. Hgb A1c No results for input(s): HGBA1C in the last 72 hours. Lipid Profile No results for input(s): CHOL, HDL, LDLCALC, TRIG, CHOLHDL, LDLDIRECT in the last 72 hours. Thyroid function studies No results for input(s): TSH, T4TOTAL, T3FREE, THYROIDAB in the last 72 hours.  Invalid input(s): FREET3 Anemia work up No results for input(s): VITAMINB12, FOLATE, FERRITIN, TIBC, IRON, RETICCTPCT in the last 72 hours. Urinalysis    Component Value Date/Time   COLORURINE YELLOW 10/29/2015 1849   APPEARANCEUR CLEAR 10/29/2015 1849   LABSPEC 1.012 10/29/2015 1849   PHURINE 6.0 10/29/2015 1849   GLUCOSEU NEGATIVE 10/29/2015 1849   HGBUR NEGATIVE 10/29/2015 1849   BILIRUBINUR NEGATIVE 10/29/2015 1849   BILIRUBINUR neg 08/05/2011 1119   KETONESUR NEGATIVE 10/29/2015 1849   PROTEINUR NEGATIVE 10/29/2015 1849   UROBILINOGEN 1.0 01/18/2015 1312   NITRITE NEGATIVE 10/29/2015 1849   LEUKOCYTESUR SMALL (A) 10/29/2015 1849   Sepsis Labs Invalid input(s): PROCALCITONIN,  WBC,  LACTICIDVEN Microbiology No results found for this or any previous visit (from the past 240 hour(s)).   Time coordinating discharge: Over 30 minutes  SIGNED:   Birdie Hopes, MD  [facility] 03/16/2017, 11:20 AM Pager   If 7PM-7AM, please contact night-coverage [email or web address] Password [email or web address]

## 2017-03-17 ENCOUNTER — Telehealth: Payer: Self-pay

## 2017-03-17 NOTE — Telephone Encounter (Addendum)
Patient contacted the office returning TCM call.  TCM call complete and patient appointment made for 03/24/17 at 11:00am.    Dr. Silvio Pate please note that patient would like to be referred for home health services for bathing and PT.  She states she did not qualify from hospital but would like to review further with you at appointment next week.  States that hospital suggested outpatient therapy but patient states she cannot afford. I am not familiar with patient but is she considered "home bound"?  I wonder if that is the problem?  I, also, wonder if she would qualify for home health nursing based around CHF decline, education for disease management and need to decrease frequent hospitalizations.  While they are there for the nursing need they can re-eval for in home PT and provide aide support.  Just some thoughts to consider for her appointment next week.  Thanks.

## 2017-03-17 NOTE — Telephone Encounter (Signed)
Transition Care Management Follow-up Telephone Call   Date discharged? 03/16/17   How have you been since you were released from the hospital? "Doing better now"   Do you understand why you were in the hospital? Yes   Do you understand the discharge instructions? Yes   Where were you discharged to? Home   Items Reviewed:  Medications reviewed: Yes  Allergies reviewed: Yes  Dietary changes reviewed: Yes, continue on low sodium diet which she is aware of  Referrals reviewed: Yes.  Sees cardiology Dr. Hinton Rao on Tuesday 03/23/17 and follows up with Dr. Silvio Pate on 03/24/17.     Functional Questionnaire:   Activities of Daily Living (ADLs):   She states she can ambulate ad lib with a walker, dress and toilet self independently.   States they require assistance with the following: mostly with bathing and would like to look into receiving in home support for bathing and physical therapy. According to the patient, she did not qualify for home health upon leaving the hospital.  We can review with her at follow up with PCP next week.  Daughter provides meal support as well.    Any transportation issues/concerns?: No, daughter provides.   Any patient concerns? No, except would like referral for home health and therapy services.     Confirmed importance and date/time of follow-up visits scheduled Yes.  Provider Appointment booked with Dr. Silvio Pate on Wednesday 03/24/17 for follow up.    Confirmed with patient if condition begins to worsen call PCP or go to the ER.  Patient was given the office number and encouraged to call back with question or concerns.  Yes.

## 2017-03-17 NOTE — Telephone Encounter (Signed)
LM requesting call back to complete TCM and confirm hosp f/u appt.  

## 2017-03-18 NOTE — Telephone Encounter (Signed)
I think she would be considered homebound--but may be independent with ADLs or some other exclusion criteria. Seems like she ought to qualify though

## 2017-03-19 ENCOUNTER — Telehealth: Payer: Self-pay

## 2017-03-19 NOTE — Telephone Encounter (Signed)
Yes--that would be good. Not sure why it didn't happen from the hospital

## 2017-03-19 NOTE — Telephone Encounter (Signed)
Scarlette nurse with [facility] left v/m requesting cb if Dr Silvio Pate wants to readmit pt into the CHF monitoring and cardiopulmonary program since pt recently discharged from hospital for CHF exacerbation.

## 2017-03-19 NOTE — Telephone Encounter (Signed)
Spoke to [employer]

## 2017-03-20 DIAGNOSIS — M1991 Primary osteoarthritis, unspecified site: Secondary | ICD-10-CM | POA: Diagnosis not present

## 2017-03-20 DIAGNOSIS — M109 Gout, unspecified: Secondary | ICD-10-CM | POA: Diagnosis not present

## 2017-03-20 DIAGNOSIS — I2510 Atherosclerotic heart disease of native coronary artery without angina pectoris: Secondary | ICD-10-CM | POA: Diagnosis not present

## 2017-03-20 DIAGNOSIS — N183 Chronic kidney disease, stage 3 (moderate): Secondary | ICD-10-CM | POA: Diagnosis not present

## 2017-03-20 DIAGNOSIS — I5043 Acute on chronic combined systolic (congestive) and diastolic (congestive) heart failure: Secondary | ICD-10-CM | POA: Diagnosis not present

## 2017-03-20 DIAGNOSIS — Z9981 Dependence on supplemental oxygen: Secondary | ICD-10-CM | POA: Diagnosis not present

## 2017-03-20 DIAGNOSIS — F419 Anxiety disorder, unspecified: Secondary | ICD-10-CM

## 2017-03-20 DIAGNOSIS — F329 Major depressive disorder, single episode, unspecified: Secondary | ICD-10-CM

## 2017-03-20 DIAGNOSIS — H9190 Unspecified hearing loss, unspecified ear: Secondary | ICD-10-CM | POA: Diagnosis not present

## 2017-03-20 DIAGNOSIS — I739 Peripheral vascular disease, unspecified: Secondary | ICD-10-CM | POA: Diagnosis not present

## 2017-03-20 DIAGNOSIS — J449 Chronic obstructive pulmonary disease, unspecified: Secondary | ICD-10-CM | POA: Diagnosis not present

## 2017-03-20 DIAGNOSIS — J9621 Acute and chronic respiratory failure with hypoxia: Secondary | ICD-10-CM | POA: Diagnosis not present

## 2017-03-20 DIAGNOSIS — Z7982 Long term (current) use of aspirin: Secondary | ICD-10-CM

## 2017-03-20 DIAGNOSIS — I130 Hypertensive heart and chronic kidney disease with heart failure and stage 1 through stage 4 chronic kidney disease, or unspecified chronic kidney disease: Secondary | ICD-10-CM | POA: Diagnosis not present

## 2017-03-20 DIAGNOSIS — I252 Old myocardial infarction: Secondary | ICD-10-CM

## 2017-03-20 DIAGNOSIS — M810 Age-related osteoporosis without current pathological fracture: Secondary | ICD-10-CM | POA: Diagnosis not present

## 2017-03-20 DIAGNOSIS — M4800 Spinal stenosis, site unspecified: Secondary | ICD-10-CM | POA: Diagnosis not present

## 2017-03-20 DIAGNOSIS — Z853 Personal history of malignant neoplasm of breast: Secondary | ICD-10-CM

## 2017-03-22 ENCOUNTER — Ambulatory Visit (INDEPENDENT_AMBULATORY_CARE_PROVIDER_SITE_OTHER): Payer: Medicare Other | Admitting: Specialist

## 2017-03-22 ENCOUNTER — Telehealth: Payer: Self-pay | Admitting: *Deleted

## 2017-03-22 ENCOUNTER — Other Ambulatory Visit (INDEPENDENT_AMBULATORY_CARE_PROVIDER_SITE_OTHER): Payer: Self-pay | Admitting: Radiology

## 2017-03-22 DIAGNOSIS — M25552 Pain in left hip: Secondary | ICD-10-CM

## 2017-03-22 NOTE — Telephone Encounter (Signed)
All that is okay

## 2017-03-22 NOTE — Telephone Encounter (Signed)
[facility] left a voicemail stating that they opened Home Health care over the weekend for patient for CHF management. [facility] is requesting orders for 2 times a week for 3 weeks, one time a week for 5 weeks, 2 prn visits. [facility] is also requesting orders for home health aid for 2 times a week for 8 weeks.

## 2017-03-23 ENCOUNTER — Other Ambulatory Visit: Payer: Self-pay | Admitting: Internal Medicine

## 2017-03-23 DIAGNOSIS — I739 Peripheral vascular disease, unspecified: Secondary | ICD-10-CM | POA: Diagnosis not present

## 2017-03-23 DIAGNOSIS — I119 Hypertensive heart disease without heart failure: Secondary | ICD-10-CM | POA: Diagnosis not present

## 2017-03-23 DIAGNOSIS — I2510 Atherosclerotic heart disease of native coronary artery without angina pectoris: Secondary | ICD-10-CM | POA: Diagnosis not present

## 2017-03-23 DIAGNOSIS — I639 Cerebral infarction, unspecified: Secondary | ICD-10-CM | POA: Diagnosis not present

## 2017-03-23 DIAGNOSIS — I5020 Unspecified systolic (congestive) heart failure: Secondary | ICD-10-CM | POA: Diagnosis not present

## 2017-03-23 NOTE — Telephone Encounter (Signed)
Last Rx 10/2016. States she was awaiting mail order but I am unable to locate #[phone] supply. Last OV 02/2017. pls advise

## 2017-03-23 NOTE — Telephone Encounter (Signed)
Lm on amanda's phone and provided verbal orders

## 2017-03-23 NOTE — Telephone Encounter (Signed)
Approved: okay x 1 year. Can fill smaller supply locally if she needs right away

## 2017-03-24 ENCOUNTER — Ambulatory Visit (INDEPENDENT_AMBULATORY_CARE_PROVIDER_SITE_OTHER): Payer: Medicare Other | Admitting: Internal Medicine

## 2017-03-24 ENCOUNTER — Encounter: Payer: Self-pay | Admitting: Internal Medicine

## 2017-03-24 VITALS — BP 116/68 | HR 74 | Temp 98.2°F | Wt 182.0 lb

## 2017-03-24 DIAGNOSIS — I2510 Atherosclerotic heart disease of native coronary artery without angina pectoris: Secondary | ICD-10-CM | POA: Diagnosis not present

## 2017-03-24 DIAGNOSIS — Z23 Encounter for immunization: Secondary | ICD-10-CM | POA: Diagnosis not present

## 2017-03-24 DIAGNOSIS — I5043 Acute on chronic combined systolic (congestive) and diastolic (congestive) heart failure: Secondary | ICD-10-CM | POA: Diagnosis not present

## 2017-03-24 DIAGNOSIS — M4800 Spinal stenosis, site unspecified: Secondary | ICD-10-CM | POA: Diagnosis not present

## 2017-03-24 DIAGNOSIS — J449 Chronic obstructive pulmonary disease, unspecified: Secondary | ICD-10-CM | POA: Diagnosis not present

## 2017-03-24 DIAGNOSIS — I5042 Chronic combined systolic (congestive) and diastolic (congestive) heart failure: Secondary | ICD-10-CM | POA: Diagnosis not present

## 2017-03-24 DIAGNOSIS — J9611 Chronic respiratory failure with hypoxia: Secondary | ICD-10-CM | POA: Diagnosis not present

## 2017-03-24 DIAGNOSIS — N183 Chronic kidney disease, stage 3 (moderate): Secondary | ICD-10-CM | POA: Diagnosis not present

## 2017-03-24 DIAGNOSIS — M1991 Primary osteoarthritis, unspecified site: Secondary | ICD-10-CM | POA: Diagnosis not present

## 2017-03-24 DIAGNOSIS — I25119 Atherosclerotic heart disease of native coronary artery with unspecified angina pectoris: Secondary | ICD-10-CM | POA: Diagnosis not present

## 2017-03-24 DIAGNOSIS — J9621 Acute and chronic respiratory failure with hypoxia: Secondary | ICD-10-CM | POA: Diagnosis not present

## 2017-03-24 DIAGNOSIS — I739 Peripheral vascular disease, unspecified: Secondary | ICD-10-CM | POA: Diagnosis not present

## 2017-03-24 DIAGNOSIS — I130 Hypertensive heart and chronic kidney disease with heart failure and stage 1 through stage 4 chronic kidney disease, or unspecified chronic kidney disease: Secondary | ICD-10-CM | POA: Diagnosis not present

## 2017-03-24 DIAGNOSIS — M109 Gout, unspecified: Secondary | ICD-10-CM | POA: Diagnosis not present

## 2017-03-24 DIAGNOSIS — M810 Age-related osteoporosis without current pathological fracture: Secondary | ICD-10-CM | POA: Diagnosis not present

## 2017-03-24 LAB — RENAL FUNCTION PANEL
ALBUMIN: 3.9 g/dL (ref 3.5–5.2)
BUN: 28 mg/dL — ABNORMAL HIGH (ref 6–23)
CHLORIDE: 90 meq/L — AB (ref 96–112)
CO2: 34 mEq/L — ABNORMAL HIGH (ref 19–32)
Calcium: 9.4 mg/dL (ref 8.4–10.5)
Creatinine, Ser: 1.23 mg/dL — ABNORMAL HIGH (ref 0.40–1.20)
GFR: 44 mL/min — AB (ref >60.00)
GLUCOSE: 101 mg/dL — AB (ref 70–99)
POTASSIUM: 4.6 meq/L (ref 3.5–5.1)
Phosphorus: 3.7 mg/dL (ref 2.3–4.6)
Sodium: 131 mEq/L — ABNORMAL LOW (ref 135–145)

## 2017-03-24 LAB — CBC WITH DIFFERENTIAL/PLATELET
BASOS ABS: 0 10*3/uL (ref 0.0–0.1)
Basophils Relative: 0.6 % (ref 0.0–3.0)
EOS PCT: 2 % (ref 0.0–5.0)
Eosinophils Absolute: 0.1 10*3/uL (ref 0.0–0.7)
HCT: 33.4 % — ABNORMAL LOW (ref 36.0–46.0)
HEMOGLOBIN: 11.2 g/dL — AB (ref 12.0–15.0)
Lymphocytes Relative: 11.3 % — ABNORMAL LOW (ref 12.0–46.0)
Lymphs Abs: 0.7 10*3/uL (ref 0.7–4.0)
MCHC: 33.4 g/dL (ref 30.0–36.0)
MCV: 101.5 fl — AB (ref 78.0–100.0)
MONO ABS: 0.8 10*3/uL (ref 0.1–1.0)
MONOS PCT: 12.5 % — AB (ref 3.0–12.0)
NEUTROS PCT: 73.6 % (ref 43.0–77.0)
Neutro Abs: 4.4 10*3/uL (ref 1.4–7.7)
Platelets: 182 10*3/uL (ref 150.0–400.0)
RBC: 3.29 Mil/uL — AB (ref 3.87–5.11)
RDW: 12.6 % (ref 11.5–15.5)
WBC: 6 10*3/uL (ref 4.0–10.5)

## 2017-03-24 MED ORDER — MORPHINE SULFATE (CONCENTRATE) 10 MG /0.5 ML PO SOLN: 5.0000 mg | ORAL | 0 refills | Status: DC | PRN

## 2017-03-24 NOTE — Assessment & Plan Note (Signed)
I suspect she has had pulmonary edema as an anginal equivalent Will have her take the furosemide bid for a few days---she is likely not at dry weight now Asked her to try nitro--and then MSO4 (gave Rx) if SOB comes---to see if she can avoid hospitalization May benefit from CHF clinic

## 2017-03-24 NOTE — Assessment & Plan Note (Signed)
Ongoing angina at times Limited activity tolerance

## 2017-03-24 NOTE — Assessment & Plan Note (Signed)
Overall doing better with the oxygen

## 2017-03-24 NOTE — Progress Notes (Signed)
Subjective:    Patient ID: Haley Harris, female    DOB: 1929/05/01, 83 y.o.   MRN: [id number]  HPI Here for hospital follow up  With son, [facility] records reviewed  Had mild increase in weight at home but nothing significant Awoke at 3AM and didn't feel right Then noted SOB so 911 called Didn't try the nitro  Some atrial fib noted-- by EMTs (she is not positive) Slight elevation in enzymes BNP mildly elevated--CXR equivocal Immediately felt better with BiPap Admitted for 3 nights  Since home, on furosemide 40mg  daily and another dose if weight up Some SOB---but oxygen does help  Gets chest pressure at times--not with the 3AM SOB Weighing daily still  Current Outpatient Prescriptions on File Prior to Visit  Medication Sig Dispense Refill  . acetaminophen (TYLENOL) 500 MG tablet Take 1,000 mg by mouth every 8 (eight) hours as needed for mild pain.     Marland Kitchen albuterol (PROVENTIL) (2.5 MG/3ML) 0.083% nebulizer solution Take 3 mLs (2.5 mg total) by nebulization every 6 (six) hours as needed for wheezing or shortness of breath. 540 mL 3  . ALPRAZolam (XANAX) 0.25 MG tablet Take 0.5-1 tablets (0.125-0.25 mg total) by mouth 2 (two) times daily as needed for anxiety. 20 tablet 0  . aspirin 81 MG tablet Take 81 mg by mouth every evening.     Marland Kitchen atorvastatin (LIPITOR) 40 MG tablet Take 1 tablet (40 mg total) by mouth daily at 6 PM. 30 tablet 3  . bisacodyl (DULCOLAX) 5 MG EC tablet Take 2 tablets (10 mg total) by mouth daily as needed for moderate constipation. 30 tablet 0  . Calcium Carbonate-Vitamin D (CALTRATE 600+D PO) Take 1 tablet by mouth daily after lunch.     . carvedilol (COREG) 6.25 MG tablet Take 1 tablet (6.25 mg total) by mouth 2 (two) times daily with a meal. Take 1 tablet by mouth two  times daily with a meal 60 tablet 3  . denosumab (PROLIA) 60 MG/ML SOLN injection Inject 60 mg into the skin every 6 (six) months.     . escitalopram (LEXAPRO) 10 MG tablet TAKE 1 TABLET BY  MOUTH  DAILY 90 tablet 3  . famotidine (PEPCID) 20 MG tablet Take 1 tablet (20 mg total) by mouth 2 (two) times daily. 180 tablet 3  . fluticasone (FLONASE) 50 MCG/ACT nasal spray Use 2 sprays in each  nostril daily 48 g 3  . furosemide (LASIX) 40 MG tablet Take 1 tablet (40 mg total) by mouth 2 (two) times daily. Take two tabs BID with weight gain (Patient taking differently: Take 40 mg by mouth daily. Take 1 pill twice daily if weight has elevated.) 60 tablet 3  . gabapentin (NEURONTIN) 100 MG capsule TAKE 1 CAPSULE BY MOUTH TWO TIMES DAILY 180 capsule 3  . losartan (COZAAR) 50 MG tablet TAKE 1 TABLET BY MOUTH  DAILY 90 tablet 3  . montelukast (SINGULAIR) 10 MG tablet TAKE 1 TABLET BY MOUTH  DAILY 90 tablet 3  . Multiple Vitamin (MULITIVITAMIN WITH MINERALS) TABS Take 1 tablet by mouth daily after lunch.     . nitroGLYCERIN (NITROSTAT) 0.4 MG SL tablet Place 1 tablet (0.4 mg total) under the tongue every 5 (five) minutes x 3 doses as needed for chest pain. 25 tablet 2  . PROAIR HFA 108 (90 Base) MCG/ACT inhaler USE 2 PUFFS EVERY 6 HOURS AS NEEDED FOR WHEEZING OR SHORTNESS OF BREATH 8.5 g 5  . spironolactone (ALDACTONE) 25  MG tablet TAKE 1 TABLET BY MOUTH  DAILY 90 tablet 3  . vitamin B-12 (CYANOCOBALAMIN) 500 MCG tablet Take 1,000 mcg by mouth 3 (three) times a week. Mon, Wed, Fri     No current facility-administered medications on file prior to visit.     Allergies  Allergen Reactions  . Amoxicillin-Pot Clavulanate Anaphylaxis  . Penicillins Anaphylaxis  . Cephalexin Other (See Comments)    Reaction unknown  . Clarithromycin Other (See Comments)    Reaction unknown  . Lidocaine     Patient had tremors following use of lidocaine pain patches  . Nifedipine Other (See Comments)    Reaction unknown  . Nsaids Other (See Comments)    Heart issue  . Olmesartan Medoxomil     REACTION: cough;  But tolerating Losartan 02/2014  . Tramadol Hcl Other (See Comments)    loopy    Past Medical  History:  Diagnosis Date  . ACE-inhibitor cough   . Allergic rhinitis, cause unspecified   . Angina   . Anxiety   . CHF (congestive heart failure) ([place])   . Chronic airway obstruction, not elsewhere classified   . Compression fracture of T12 vertebra ([place]) 10/28/2011  . Depressive disorder, not elsewhere classified   . Esophageal reflux   . Gout   . Heart murmur    aS CHILD  . Malignant neoplasm of breast (female), unspecified site   . Myocardial infarct ([place])   . Neuromuscular disorder ([place])    NEROPATHY FROM STENOSIS  . Occlusion and stenosis of carotid artery without mention of cerebral infarction   . Osteoarthrosis, unspecified whether generalized or localized, unspecified site   . Osteoporosis, unspecified   . Other and unspecified hyperlipidemia   . Other dyspnea and respiratory abnormality   . Oxygen dependent   . Peripheral vascular disease, unspecified ([place])   . Personal history of malignant neoplasm of breast   . Pneumonia   . Recurrent upper respiratory infection (URI)   . Shortness of breath   . Spinal stenosis   . Unspecified cardiovascular disease   . Unspecified essential hypertension   . Unspecified hearing loss   . Unspecified urinary incontinence     Past Surgical History:  Procedure Laterality Date  . ABDOMINAL HYSTERECTOMY    . APPENDECTOMY  1948  . BREAST IMPLANT REMOVAL  06/12/09   right  . BREAST RECONSTRUCTION  1998   Reconstruction   . CARDIAC CATHETERIZATION N/A 01/25/2015   Procedure: Left Heart Cath and Coronary Angiography;  Surgeon: Charolette Forward, MD;  Location: [place] [facility];  Service: Cardiovascular;  Laterality: N/A;  . CORONARY ANGIOPLASTY  11/12   distal RCA  . FLEXIBLE SIGMOIDOSCOPY N/A 01/21/2015   Procedure: FLEXIBLE SIGMOIDOSCOPY;  Surgeon: Richmond Campbell, MD;  Location: [facility] ENDOSCOPY;  Service: Endoscopy;  Laterality: N/A;  . FLEXIBLE SIGMOIDOSCOPY  01/22/2015      . FRACTURE SURGERY     fracture right elbow  . LEFT HEART  CATH AND CORONARY ANGIOGRAPHY N/A 11/14/2016   Procedure: Left Heart Cath and Coronary Angiography;  Surgeon: Charolette Forward, MD;  Location: [place] [facility];  Service: Cardiovascular;  Laterality: N/A;  . LEFT HEART CATHETERIZATION WITH CORONARY ANGIOGRAM N/A 06/05/2011   Procedure: LEFT HEART CATHETERIZATION WITH CORONARY ANGIOGRAM;  Surgeon: Clent Demark, MD;  Location: [place] CATH LAB;  Service: Cardiovascular;  Laterality: N/A;  . LEFT HEART CATHETERIZATION WITH CORONARY ANGIOGRAM N/A 03/05/2014   Procedure: LEFT HEART CATHETERIZATION WITH CORONARY ANGIOGRAM;  Surgeon: Clent Demark,  MD;  Location: [place] CATH LAB;  Service: Cardiovascular;  Laterality: N/A;  . LUMBAR LAMINECTOMY  2003  . MASTECTOMY  1987   Right  . MASTECTOMY    . MASTOIDECTOMY  childhood  . REDUCTION MAMMAPLASTY  1998   Left  . SHOULDER SURGERY  02/2005   Bilateral fractures with multiple surgeries    Family History  Problem Relation Age of Onset  . Heart attack Father 9  . Cancer Mother        ovarian  . Cancer Brother        lung  . Arthritis Unknown        family    Social History   Social History  . Marital status: Widowed    Spouse name: N/A  . Number of children: 2  . Years of education: N/A   Occupational History  . 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  [employer]   Social History Main Topics  . Smoking status: Former Smoker    Quit date: 07/13/1974  . Smokeless tobacco: Never Used  . Alcohol use 0.0 oz/week     Comment: occasional  . Drug use: No  . Sexual activity: No   Other Topics Concern  . Not on file   Social History Narrative   Has living will   Son or daughter would be health care POA.   Requests DNR--written 03/02/13   No tube feeds if cognitively unaware   Review of Systems Appetite is okay--but gets full quickly Avoiding salt still--for the most part Sleeps in bed-- uses only 1 pillow. Will occasionally have to sleep in recliner (discussed that if this  happens, she needs to take extra furosemide)    Objective:   Physical Exam  Constitutional: No distress.  Neck: No thyromegaly present.  Cardiovascular: Exam reveals no gallop.   No murmur heard. Distant but regular  Pulmonary/Chest: Effort normal. No respiratory distress. She has no wheezes. She has no rales.  Decreased breath sounds but clear  Musculoskeletal:  Trace edema  Lymphadenopathy:    She has no cervical adenopathy.          Assessment & Plan:

## 2017-03-24 NOTE — Patient Instructions (Signed)
I suspect your dry weight is at least 5# less than you are now.

## 2017-03-25 DIAGNOSIS — I2510 Atherosclerotic heart disease of native coronary artery without angina pectoris: Secondary | ICD-10-CM | POA: Diagnosis not present

## 2017-03-25 DIAGNOSIS — I130 Hypertensive heart and chronic kidney disease with heart failure and stage 1 through stage 4 chronic kidney disease, or unspecified chronic kidney disease: Secondary | ICD-10-CM | POA: Diagnosis not present

## 2017-03-25 DIAGNOSIS — M1991 Primary osteoarthritis, unspecified site: Secondary | ICD-10-CM | POA: Diagnosis not present

## 2017-03-25 DIAGNOSIS — N183 Chronic kidney disease, stage 3 (moderate): Secondary | ICD-10-CM | POA: Diagnosis not present

## 2017-03-25 DIAGNOSIS — M4800 Spinal stenosis, site unspecified: Secondary | ICD-10-CM | POA: Diagnosis not present

## 2017-03-25 DIAGNOSIS — J449 Chronic obstructive pulmonary disease, unspecified: Secondary | ICD-10-CM | POA: Diagnosis not present

## 2017-03-25 DIAGNOSIS — M109 Gout, unspecified: Secondary | ICD-10-CM | POA: Diagnosis not present

## 2017-03-25 DIAGNOSIS — I5043 Acute on chronic combined systolic (congestive) and diastolic (congestive) heart failure: Secondary | ICD-10-CM | POA: Diagnosis not present

## 2017-03-25 DIAGNOSIS — I5020 Unspecified systolic (congestive) heart failure: Secondary | ICD-10-CM | POA: Diagnosis not present

## 2017-03-25 DIAGNOSIS — M810 Age-related osteoporosis without current pathological fracture: Secondary | ICD-10-CM | POA: Diagnosis not present

## 2017-03-25 DIAGNOSIS — J9621 Acute and chronic respiratory failure with hypoxia: Secondary | ICD-10-CM | POA: Diagnosis not present

## 2017-03-25 DIAGNOSIS — I739 Peripheral vascular disease, unspecified: Secondary | ICD-10-CM | POA: Diagnosis not present

## 2017-03-26 DIAGNOSIS — M109 Gout, unspecified: Secondary | ICD-10-CM | POA: Diagnosis not present

## 2017-03-26 DIAGNOSIS — I130 Hypertensive heart and chronic kidney disease with heart failure and stage 1 through stage 4 chronic kidney disease, or unspecified chronic kidney disease: Secondary | ICD-10-CM | POA: Diagnosis not present

## 2017-03-26 DIAGNOSIS — J449 Chronic obstructive pulmonary disease, unspecified: Secondary | ICD-10-CM | POA: Diagnosis not present

## 2017-03-26 DIAGNOSIS — M810 Age-related osteoporosis without current pathological fracture: Secondary | ICD-10-CM | POA: Diagnosis not present

## 2017-03-26 DIAGNOSIS — M4800 Spinal stenosis, site unspecified: Secondary | ICD-10-CM | POA: Diagnosis not present

## 2017-03-26 DIAGNOSIS — I5043 Acute on chronic combined systolic (congestive) and diastolic (congestive) heart failure: Secondary | ICD-10-CM | POA: Diagnosis not present

## 2017-03-26 DIAGNOSIS — J9621 Acute and chronic respiratory failure with hypoxia: Secondary | ICD-10-CM | POA: Diagnosis not present

## 2017-03-26 DIAGNOSIS — M1991 Primary osteoarthritis, unspecified site: Secondary | ICD-10-CM | POA: Diagnosis not present

## 2017-03-26 DIAGNOSIS — N183 Chronic kidney disease, stage 3 (moderate): Secondary | ICD-10-CM | POA: Diagnosis not present

## 2017-03-26 DIAGNOSIS — I2510 Atherosclerotic heart disease of native coronary artery without angina pectoris: Secondary | ICD-10-CM | POA: Diagnosis not present

## 2017-03-26 DIAGNOSIS — I739 Peripheral vascular disease, unspecified: Secondary | ICD-10-CM | POA: Diagnosis not present

## 2017-03-29 DIAGNOSIS — N183 Chronic kidney disease, stage 3 (moderate): Secondary | ICD-10-CM | POA: Diagnosis not present

## 2017-03-29 DIAGNOSIS — I2510 Atherosclerotic heart disease of native coronary artery without angina pectoris: Secondary | ICD-10-CM | POA: Diagnosis not present

## 2017-03-29 DIAGNOSIS — I5043 Acute on chronic combined systolic (congestive) and diastolic (congestive) heart failure: Secondary | ICD-10-CM | POA: Diagnosis not present

## 2017-03-29 DIAGNOSIS — M4800 Spinal stenosis, site unspecified: Secondary | ICD-10-CM | POA: Diagnosis not present

## 2017-03-29 DIAGNOSIS — J9621 Acute and chronic respiratory failure with hypoxia: Secondary | ICD-10-CM | POA: Diagnosis not present

## 2017-03-29 DIAGNOSIS — I130 Hypertensive heart and chronic kidney disease with heart failure and stage 1 through stage 4 chronic kidney disease, or unspecified chronic kidney disease: Secondary | ICD-10-CM | POA: Diagnosis not present

## 2017-03-29 DIAGNOSIS — M1991 Primary osteoarthritis, unspecified site: Secondary | ICD-10-CM | POA: Diagnosis not present

## 2017-03-29 DIAGNOSIS — M109 Gout, unspecified: Secondary | ICD-10-CM | POA: Diagnosis not present

## 2017-03-29 DIAGNOSIS — M810 Age-related osteoporosis without current pathological fracture: Secondary | ICD-10-CM | POA: Diagnosis not present

## 2017-03-29 DIAGNOSIS — I739 Peripheral vascular disease, unspecified: Secondary | ICD-10-CM | POA: Diagnosis not present

## 2017-03-29 DIAGNOSIS — J449 Chronic obstructive pulmonary disease, unspecified: Secondary | ICD-10-CM | POA: Diagnosis not present

## 2017-03-30 DIAGNOSIS — J449 Chronic obstructive pulmonary disease, unspecified: Secondary | ICD-10-CM | POA: Diagnosis not present

## 2017-03-30 DIAGNOSIS — M810 Age-related osteoporosis without current pathological fracture: Secondary | ICD-10-CM | POA: Diagnosis not present

## 2017-03-30 DIAGNOSIS — M1991 Primary osteoarthritis, unspecified site: Secondary | ICD-10-CM | POA: Diagnosis not present

## 2017-03-30 DIAGNOSIS — J9621 Acute and chronic respiratory failure with hypoxia: Secondary | ICD-10-CM | POA: Diagnosis not present

## 2017-03-30 DIAGNOSIS — M109 Gout, unspecified: Secondary | ICD-10-CM | POA: Diagnosis not present

## 2017-03-30 DIAGNOSIS — N183 Chronic kidney disease, stage 3 (moderate): Secondary | ICD-10-CM | POA: Diagnosis not present

## 2017-03-30 DIAGNOSIS — I130 Hypertensive heart and chronic kidney disease with heart failure and stage 1 through stage 4 chronic kidney disease, or unspecified chronic kidney disease: Secondary | ICD-10-CM | POA: Diagnosis not present

## 2017-03-30 DIAGNOSIS — I2510 Atherosclerotic heart disease of native coronary artery without angina pectoris: Secondary | ICD-10-CM | POA: Diagnosis not present

## 2017-03-30 DIAGNOSIS — I739 Peripheral vascular disease, unspecified: Secondary | ICD-10-CM | POA: Diagnosis not present

## 2017-03-30 DIAGNOSIS — M4800 Spinal stenosis, site unspecified: Secondary | ICD-10-CM | POA: Diagnosis not present

## 2017-03-30 DIAGNOSIS — I5043 Acute on chronic combined systolic (congestive) and diastolic (congestive) heart failure: Secondary | ICD-10-CM | POA: Diagnosis not present

## 2017-03-31 ENCOUNTER — Telehealth (INDEPENDENT_AMBULATORY_CARE_PROVIDER_SITE_OTHER): Payer: Self-pay | Admitting: Physical Medicine and Rehabilitation

## 2017-03-31 DIAGNOSIS — I5043 Acute on chronic combined systolic (congestive) and diastolic (congestive) heart failure: Secondary | ICD-10-CM | POA: Diagnosis not present

## 2017-03-31 DIAGNOSIS — M810 Age-related osteoporosis without current pathological fracture: Secondary | ICD-10-CM | POA: Diagnosis not present

## 2017-03-31 DIAGNOSIS — M109 Gout, unspecified: Secondary | ICD-10-CM | POA: Diagnosis not present

## 2017-03-31 DIAGNOSIS — J449 Chronic obstructive pulmonary disease, unspecified: Secondary | ICD-10-CM | POA: Diagnosis not present

## 2017-03-31 DIAGNOSIS — M1991 Primary osteoarthritis, unspecified site: Secondary | ICD-10-CM | POA: Diagnosis not present

## 2017-03-31 DIAGNOSIS — N183 Chronic kidney disease, stage 3 (moderate): Secondary | ICD-10-CM | POA: Diagnosis not present

## 2017-03-31 DIAGNOSIS — J9621 Acute and chronic respiratory failure with hypoxia: Secondary | ICD-10-CM | POA: Diagnosis not present

## 2017-03-31 DIAGNOSIS — M4800 Spinal stenosis, site unspecified: Secondary | ICD-10-CM | POA: Diagnosis not present

## 2017-03-31 DIAGNOSIS — I130 Hypertensive heart and chronic kidney disease with heart failure and stage 1 through stage 4 chronic kidney disease, or unspecified chronic kidney disease: Secondary | ICD-10-CM | POA: Diagnosis not present

## 2017-03-31 DIAGNOSIS — I2510 Atherosclerotic heart disease of native coronary artery without angina pectoris: Secondary | ICD-10-CM | POA: Diagnosis not present

## 2017-03-31 DIAGNOSIS — I739 Peripheral vascular disease, unspecified: Secondary | ICD-10-CM | POA: Diagnosis not present

## 2017-03-31 NOTE — Telephone Encounter (Signed)
Called.

## 2017-03-31 NOTE — Telephone Encounter (Signed)
Patient daughter Haley Harris wants someone to call her with mother's appointment [phone].Stated mom does not always answer the phone.

## 2017-04-01 ENCOUNTER — Other Ambulatory Visit: Payer: Self-pay | Admitting: Internal Medicine

## 2017-04-01 DIAGNOSIS — I2510 Atherosclerotic heart disease of native coronary artery without angina pectoris: Secondary | ICD-10-CM | POA: Diagnosis not present

## 2017-04-01 DIAGNOSIS — I130 Hypertensive heart and chronic kidney disease with heart failure and stage 1 through stage 4 chronic kidney disease, or unspecified chronic kidney disease: Secondary | ICD-10-CM | POA: Diagnosis not present

## 2017-04-01 DIAGNOSIS — I739 Peripheral vascular disease, unspecified: Secondary | ICD-10-CM | POA: Diagnosis not present

## 2017-04-01 DIAGNOSIS — M109 Gout, unspecified: Secondary | ICD-10-CM | POA: Diagnosis not present

## 2017-04-01 DIAGNOSIS — M4800 Spinal stenosis, site unspecified: Secondary | ICD-10-CM | POA: Diagnosis not present

## 2017-04-01 DIAGNOSIS — M1991 Primary osteoarthritis, unspecified site: Secondary | ICD-10-CM | POA: Diagnosis not present

## 2017-04-01 DIAGNOSIS — I5043 Acute on chronic combined systolic (congestive) and diastolic (congestive) heart failure: Secondary | ICD-10-CM | POA: Diagnosis not present

## 2017-04-01 DIAGNOSIS — M810 Age-related osteoporosis without current pathological fracture: Secondary | ICD-10-CM | POA: Diagnosis not present

## 2017-04-01 DIAGNOSIS — J449 Chronic obstructive pulmonary disease, unspecified: Secondary | ICD-10-CM | POA: Diagnosis not present

## 2017-04-01 DIAGNOSIS — N183 Chronic kidney disease, stage 3 (moderate): Secondary | ICD-10-CM | POA: Diagnosis not present

## 2017-04-01 DIAGNOSIS — J9621 Acute and chronic respiratory failure with hypoxia: Secondary | ICD-10-CM | POA: Diagnosis not present

## 2017-04-02 ENCOUNTER — Other Ambulatory Visit: Payer: Self-pay

## 2017-04-02 MED ORDER — FUROSEMIDE 40 MG PO TABS: 40.0000 mg | ORAL_TABLET | Freq: Every day | ORAL | 3 refills | Status: DC

## 2017-04-02 NOTE — Telephone Encounter (Signed)
Rx sent electronically.  

## 2017-04-05 DIAGNOSIS — I2510 Atherosclerotic heart disease of native coronary artery without angina pectoris: Secondary | ICD-10-CM | POA: Diagnosis not present

## 2017-04-05 DIAGNOSIS — M1991 Primary osteoarthritis, unspecified site: Secondary | ICD-10-CM | POA: Diagnosis not present

## 2017-04-05 DIAGNOSIS — N183 Chronic kidney disease, stage 3 (moderate): Secondary | ICD-10-CM | POA: Diagnosis not present

## 2017-04-05 DIAGNOSIS — M109 Gout, unspecified: Secondary | ICD-10-CM | POA: Diagnosis not present

## 2017-04-05 DIAGNOSIS — M4800 Spinal stenosis, site unspecified: Secondary | ICD-10-CM | POA: Diagnosis not present

## 2017-04-05 DIAGNOSIS — I739 Peripheral vascular disease, unspecified: Secondary | ICD-10-CM | POA: Diagnosis not present

## 2017-04-05 DIAGNOSIS — J9621 Acute and chronic respiratory failure with hypoxia: Secondary | ICD-10-CM | POA: Diagnosis not present

## 2017-04-05 DIAGNOSIS — M810 Age-related osteoporosis without current pathological fracture: Secondary | ICD-10-CM | POA: Diagnosis not present

## 2017-04-05 DIAGNOSIS — J449 Chronic obstructive pulmonary disease, unspecified: Secondary | ICD-10-CM | POA: Diagnosis not present

## 2017-04-05 DIAGNOSIS — I5043 Acute on chronic combined systolic (congestive) and diastolic (congestive) heart failure: Secondary | ICD-10-CM | POA: Diagnosis not present

## 2017-04-05 DIAGNOSIS — I130 Hypertensive heart and chronic kidney disease with heart failure and stage 1 through stage 4 chronic kidney disease, or unspecified chronic kidney disease: Secondary | ICD-10-CM | POA: Diagnosis not present

## 2017-04-06 ENCOUNTER — Other Ambulatory Visit: Payer: Self-pay | Admitting: *Deleted

## 2017-04-06 DIAGNOSIS — N183 Chronic kidney disease, stage 3 (moderate): Secondary | ICD-10-CM | POA: Diagnosis not present

## 2017-04-06 DIAGNOSIS — J9621 Acute and chronic respiratory failure with hypoxia: Secondary | ICD-10-CM | POA: Diagnosis not present

## 2017-04-06 DIAGNOSIS — J449 Chronic obstructive pulmonary disease, unspecified: Secondary | ICD-10-CM | POA: Diagnosis not present

## 2017-04-06 DIAGNOSIS — M1991 Primary osteoarthritis, unspecified site: Secondary | ICD-10-CM | POA: Diagnosis not present

## 2017-04-06 DIAGNOSIS — I2510 Atherosclerotic heart disease of native coronary artery without angina pectoris: Secondary | ICD-10-CM | POA: Diagnosis not present

## 2017-04-06 DIAGNOSIS — M109 Gout, unspecified: Secondary | ICD-10-CM | POA: Diagnosis not present

## 2017-04-06 DIAGNOSIS — M810 Age-related osteoporosis without current pathological fracture: Secondary | ICD-10-CM | POA: Diagnosis not present

## 2017-04-06 DIAGNOSIS — I739 Peripheral vascular disease, unspecified: Secondary | ICD-10-CM | POA: Diagnosis not present

## 2017-04-06 DIAGNOSIS — I130 Hypertensive heart and chronic kidney disease with heart failure and stage 1 through stage 4 chronic kidney disease, or unspecified chronic kidney disease: Secondary | ICD-10-CM | POA: Diagnosis not present

## 2017-04-06 DIAGNOSIS — M4800 Spinal stenosis, site unspecified: Secondary | ICD-10-CM | POA: Diagnosis not present

## 2017-04-06 DIAGNOSIS — I5043 Acute on chronic combined systolic (congestive) and diastolic (congestive) heart failure: Secondary | ICD-10-CM | POA: Diagnosis not present

## 2017-04-06 MED ORDER — ATORVASTATIN CALCIUM 40 MG PO TABS: 40.0000 mg | ORAL_TABLET | Freq: Every day | ORAL | 1 refills | Status: DC

## 2017-04-06 NOTE — Telephone Encounter (Signed)
Pt left vm indicating Rx request denied for pravastatin. Per shannon, pt is now on lipitor, and pravastatin is no longer on her med list. Spoke to pt and advised; states she may have an old bottle. Request [facility]itor [facility]; sent as requested

## 2017-04-08 ENCOUNTER — Telehealth: Payer: Self-pay

## 2017-04-08 ENCOUNTER — Ambulatory Visit (INDEPENDENT_AMBULATORY_CARE_PROVIDER_SITE_OTHER): Payer: Medicare Other

## 2017-04-08 ENCOUNTER — Ambulatory Visit (INDEPENDENT_AMBULATORY_CARE_PROVIDER_SITE_OTHER): Payer: Medicare Other | Admitting: Physical Medicine and Rehabilitation

## 2017-04-08 DIAGNOSIS — M25552 Pain in left hip: Secondary | ICD-10-CM | POA: Diagnosis not present

## 2017-04-08 DIAGNOSIS — M1991 Primary osteoarthritis, unspecified site: Secondary | ICD-10-CM | POA: Diagnosis not present

## 2017-04-08 DIAGNOSIS — J9621 Acute and chronic respiratory failure with hypoxia: Secondary | ICD-10-CM | POA: Diagnosis not present

## 2017-04-08 DIAGNOSIS — M810 Age-related osteoporosis without current pathological fracture: Secondary | ICD-10-CM | POA: Diagnosis not present

## 2017-04-08 DIAGNOSIS — I130 Hypertensive heart and chronic kidney disease with heart failure and stage 1 through stage 4 chronic kidney disease, or unspecified chronic kidney disease: Secondary | ICD-10-CM | POA: Diagnosis not present

## 2017-04-08 DIAGNOSIS — I739 Peripheral vascular disease, unspecified: Secondary | ICD-10-CM | POA: Diagnosis not present

## 2017-04-08 DIAGNOSIS — M109 Gout, unspecified: Secondary | ICD-10-CM | POA: Diagnosis not present

## 2017-04-08 DIAGNOSIS — M4800 Spinal stenosis, site unspecified: Secondary | ICD-10-CM | POA: Diagnosis not present

## 2017-04-08 DIAGNOSIS — J449 Chronic obstructive pulmonary disease, unspecified: Secondary | ICD-10-CM | POA: Diagnosis not present

## 2017-04-08 DIAGNOSIS — N183 Chronic kidney disease, stage 3 (moderate): Secondary | ICD-10-CM | POA: Diagnosis not present

## 2017-04-08 DIAGNOSIS — I5043 Acute on chronic combined systolic (congestive) and diastolic (congestive) heart failure: Secondary | ICD-10-CM | POA: Diagnosis not present

## 2017-04-08 DIAGNOSIS — I2510 Atherosclerotic heart disease of native coronary artery without angina pectoris: Secondary | ICD-10-CM | POA: Diagnosis not present

## 2017-04-08 NOTE — Telephone Encounter (Signed)
Okay Sounds like it is reasonable to just observe without a visit

## 2017-04-08 NOTE — Progress Notes (Deleted)
Left hip and groin pain. Constant pain. Worse with walking and standing. Ambulates with rolling walker. Saw Nitka 3 months ago, planned injection at that time but hospital for CHF. Still hip pain. Fell yesterday with scrap/lac of left knee and Left shoulder bruise.

## 2017-04-08 NOTE — Progress Notes (Signed)
Haley Harris - 83 y.o. female MRN [id number]  Date of birth: 03/04/1929  Office Visit Note: Visit Date: 04/08/2017 PCP: Venia Carbon, MD Referred by: Venia Carbon, MD  Subjective: Chief Complaint  Patient presents with  . Left Hip - Pain   HPI: Haley Harris is a pleasant 83 year old female with extensive left hip osteoarthritis. She was seen by Dr. Louanne Skye 3 months ago and they have planned diagnostic and therapeutic hip injection but she ended up in the hospital on a couple of occasions for congestive heart failure exacerbation. She is walking with a rolling walker. Her pain is worse with walking and standing. She gets a lot of groin pain which is still severe. She does report a minor fall yesterday which cause some bruising in the left shoulder and a scrape and laceration of the left knee. She's doing okay from that it did not have any problems with the hip or groin worsening.    ROS Otherwise per HPI.  Assessment & Plan: Visit Diagnoses:  1. Pain in left hip     Plan: Findings:  Diagnostic and therapeutic anesthetic hip arthrogram on the left. Patient did have relief during the anesthetic phase of the injection.    Meds & Orders: No orders of the defined types were placed in this encounter.   Orders Placed This Encounter  Procedures  . Large Joint Injection/Arthrocentesis  . XR C-ARM NO REPORT    Follow-up: Return if symptoms worsen or fail to improve, for Dr. Louanne Skye.   Procedures: Large Joint Inj Date/Time: 04/08/2017 2:39 PM Performed by: Magnus Sinning Authorized by: Magnus Sinning   Consent Given by:  Patient Site marked: the procedure site was marked   Timeout: prior to procedure the correct patient, procedure, and site was verified   Indications:  Pain and diagnostic evaluation Location:  Hip Site:  L hip joint Prep: patient was prepped and draped in usual sterile fashion   Needle Size:  22 G Approach:  Anterior Ultrasound Guidance: No     Fluoroscopic Guidance: No   Arthrogram: Yes   Medications:  80 mg triamcinolone acetonide 40 MG/ML; 3 mL bupivacaine 0.5 % Aspiration Attempted: Yes   Patient tolerance:  Patient tolerated the procedure well with no immediate complications  Arthrogram demonstrated excellent flow of contrast throughout the joint surface without extravasation or obvious defect.  The patient had relief of symptoms during the anesthetic phase of the injection.     No notes on file   Clinical History: No specialty comments available.  She reports that she quit smoking about 42 years ago. She has never used smokeless tobacco.   Recent Labs  12/15/16 1054  LABURIC 10.7*    Objective:  VS:  HT:    WT:   BMI:     BP:   HR: bpm  TEMP: ( )  RESP:  Physical Exam  Musculoskeletal:  Painful range of motion of the left hip. Patient walks with antalgic gait using rolling walker.    Ortho Exam Imaging: Xr C-arm No Report  Result Date: 04/08/2017 Please see Notes or Procedures tab for imaging impression.   Past Medical/Family/Surgical/Social History: Medications & Allergies reviewed per EMR Patient Active Problem List   Diagnosis Date Noted  . Acute on chronic respiratory failure with hypoxia ([place]) 03/13/2017  . Chronic hypoxemic respiratory failure ([place]) 03/09/2017  . DOE (dyspnea on exertion) 01/21/2017  . Gout   . Pain of finger of left hand 12/15/2016  . Benign hypertensive  heart disease with CHF and with combined systolic and diastolic dysfunction, NYHA class 3 ([place]) 12/06/2016  . Osteoarthritis of left hip 09/28/2016  . Cervical spondylosis without myelopathy 07/31/2016  . Primary osteoarthritis of both first carpometacarpal joints 07/31/2016  . Acute on chronic combined systolic and diastolic heart failure ([place]) 07/22/2016  . Spinal stenosis in cervical region 05/29/2016  . Venous stasis dermatitis 11/13/2015  . Essential hypertension 10/30/2015  . Diverticulitis of colon 02/18/2015  .  Hyponatremia   . Chronic combined systolic and diastolic CHF (congestive heart failure) ([place])   . Chronic kidney disease, stage III (moderate)   . PVD (peripheral vascular disease) ([place])   . Right adrenal mass ([place]) 01/18/2015  . Chronic venous insufficiency 12/18/2014  . Advanced directives, counseling/discussion 04/02/2014  . Neuropathy due to peripheral vascular disease ([place]) 03/11/2014  . Chronic combined systolic and diastolic CHF, NYHA class 1 ([place]) 03/06/2014  . Routine general medical examination at a health care facility 03/02/2013  . Breast cancer ([place]) 12/02/2011  . Constipation due to pain medication 10/28/2011  . T12 compression fracture ([place]) 10/28/2011  . Atherosclerotic heart disease of native coronary artery with angina pectoris ([place]) 06/05/2011  . Dyslipidemia 10/30/2006  . Episodic mood disorder ([place]) 10/30/2006  . COPD (chronic obstructive pulmonary disease) ([place]) 10/30/2006  . GERD 10/30/2006  . Osteoarthritis 10/30/2006  . OP (osteoporosis) 10/30/2006  . URINARY INCONTINENCE 10/30/2006   Past Medical History:  Diagnosis Date  . ACE-inhibitor cough   . Allergic rhinitis, cause unspecified   . Angina   . Anxiety   . CHF (congestive heart failure) ([place])   . Chronic airway obstruction, not elsewhere classified   . Compression fracture of T12 vertebra ([place]) 10/28/2011  . Depressive disorder, not elsewhere classified   . Esophageal reflux   . Gout   . Heart murmur    aS CHILD  . Malignant neoplasm of breast (female), unspecified site   . Myocardial infarct ([place])   . Neuromuscular disorder ([place])    NEROPATHY FROM STENOSIS  . Occlusion and stenosis of carotid artery without mention of cerebral infarction   . Osteoarthrosis, unspecified whether generalized or localized, unspecified site   . Osteoporosis, unspecified   . Other and unspecified hyperlipidemia   . Other dyspnea and respiratory abnormality   . Oxygen dependent   . Peripheral vascular disease,  unspecified ([place])   . Personal history of malignant neoplasm of breast   . Pneumonia   . Recurrent upper respiratory infection (URI)   . Shortness of breath   . Spinal stenosis   . Unspecified cardiovascular disease   . Unspecified essential hypertension   . Unspecified hearing loss   . Unspecified urinary incontinence    Family History  Problem Relation Age of Onset  . Heart attack Father 78  . Cancer Mother        ovarian  . Cancer Brother        lung  . Arthritis Unknown        family   Past Surgical History:  Procedure Laterality Date  . ABDOMINAL HYSTERECTOMY    . APPENDECTOMY  1948  . BREAST IMPLANT REMOVAL  06/12/09   right  . BREAST RECONSTRUCTION  1998   Reconstruction   . CARDIAC CATHETERIZATION N/A 01/25/2015   Procedure: Left Heart Cath and Coronary Angiography;  Surgeon: Charolette Forward, MD;  Location: [place] [facility];  Service: Cardiovascular;  Laterality: N/A;  . CORONARY ANGIOPLASTY  11/12   distal RCA  . FLEXIBLE  SIGMOIDOSCOPY N/A 01/21/2015   Procedure: FLEXIBLE SIGMOIDOSCOPY;  Surgeon: Richmond Campbell, MD;  Location: [facility] ENDOSCOPY;  Service: Endoscopy;  Laterality: N/A;  . FLEXIBLE SIGMOIDOSCOPY  01/22/2015      . FRACTURE SURGERY     fracture right elbow  . LEFT HEART CATH AND CORONARY ANGIOGRAPHY N/A 11/14/2016   Procedure: Left Heart Cath and Coronary Angiography;  Surgeon: Charolette Forward, MD;  Location: [place] CV LAB;  Service: Cardiovascular;  Laterality: N/A;  . LEFT HEART CATHETERIZATION WITH CORONARY ANGIOGRAM N/A 06/05/2011   Procedure: LEFT HEART CATHETERIZATION WITH CORONARY ANGIOGRAM;  Surgeon: Clent Demark, MD;  Location: [place] CATH LAB;  Service: Cardiovascular;  Laterality: N/A;  . LEFT HEART CATHETERIZATION WITH CORONARY ANGIOGRAM N/A 03/05/2014   Procedure: LEFT HEART CATHETERIZATION WITH CORONARY ANGIOGRAM;  Surgeon: Clent Demark, MD;  Location: [place] CATH LAB;  Service: Cardiovascular;  Laterality: N/A;  . LUMBAR LAMINECTOMY  2003  .  MASTECTOMY  1987   Right  . MASTECTOMY    . MASTOIDECTOMY  childhood  . REDUCTION MAMMAPLASTY  1998   Left  . SHOULDER SURGERY  02/2005   Bilateral fractures with multiple surgeries   Social History   Occupational History  . 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  [employer]   Social History Main Topics  . Smoking status: Former Smoker    Quit date: 07/13/1974  . Smokeless tobacco: Never Used  . Alcohol use 0.0 oz/week     Comment: occasional  . Drug use: No  . Sexual activity: No

## 2017-04-08 NOTE — Telephone Encounter (Signed)
Haley Harris Pt with [facility] left v/m; pt fell last night; has some bruising and laceration on shin, already dressed with no drainage or signs of infection; pt denies any MD or ED visit. Pt is sore but good movement all extremities; no H/A or dizziness, slight nausea but pt denies hit head. Pt does not feel needs eval. Haley Harris does not need cb but obligated to let PCP know if there is a fall.

## 2017-04-08 NOTE — Patient Instructions (Signed)

## 2017-04-09 DIAGNOSIS — M109 Gout, unspecified: Secondary | ICD-10-CM | POA: Diagnosis not present

## 2017-04-09 DIAGNOSIS — M4800 Spinal stenosis, site unspecified: Secondary | ICD-10-CM | POA: Diagnosis not present

## 2017-04-09 DIAGNOSIS — M810 Age-related osteoporosis without current pathological fracture: Secondary | ICD-10-CM | POA: Diagnosis not present

## 2017-04-09 DIAGNOSIS — J449 Chronic obstructive pulmonary disease, unspecified: Secondary | ICD-10-CM | POA: Diagnosis not present

## 2017-04-09 DIAGNOSIS — I130 Hypertensive heart and chronic kidney disease with heart failure and stage 1 through stage 4 chronic kidney disease, or unspecified chronic kidney disease: Secondary | ICD-10-CM | POA: Diagnosis not present

## 2017-04-09 DIAGNOSIS — J9621 Acute and chronic respiratory failure with hypoxia: Secondary | ICD-10-CM | POA: Diagnosis not present

## 2017-04-09 DIAGNOSIS — I2510 Atherosclerotic heart disease of native coronary artery without angina pectoris: Secondary | ICD-10-CM | POA: Diagnosis not present

## 2017-04-09 DIAGNOSIS — I5043 Acute on chronic combined systolic (congestive) and diastolic (congestive) heart failure: Secondary | ICD-10-CM | POA: Diagnosis not present

## 2017-04-09 DIAGNOSIS — M1991 Primary osteoarthritis, unspecified site: Secondary | ICD-10-CM | POA: Diagnosis not present

## 2017-04-09 DIAGNOSIS — I739 Peripheral vascular disease, unspecified: Secondary | ICD-10-CM | POA: Diagnosis not present

## 2017-04-09 DIAGNOSIS — N183 Chronic kidney disease, stage 3 (moderate): Secondary | ICD-10-CM | POA: Diagnosis not present

## 2017-04-09 MED ORDER — TRIAMCINOLONE ACETONIDE 40 MG/ML IJ SUSP
80.0000 mg | INTRAMUSCULAR | Status: AC | PRN
  Administered 2017-04-08: 80 mg via INTRA_ARTICULAR

## 2017-04-09 MED ORDER — BUPIVACAINE HCL 0.5 % IJ SOLN
3.0000 mL | INTRAMUSCULAR | Status: AC | PRN
  Administered 2017-04-08: 3 mL via INTRA_ARTICULAR

## 2017-04-11 DIAGNOSIS — I739 Peripheral vascular disease, unspecified: Secondary | ICD-10-CM | POA: Diagnosis not present

## 2017-04-11 DIAGNOSIS — M4800 Spinal stenosis, site unspecified: Secondary | ICD-10-CM | POA: Diagnosis not present

## 2017-04-11 DIAGNOSIS — I130 Hypertensive heart and chronic kidney disease with heart failure and stage 1 through stage 4 chronic kidney disease, or unspecified chronic kidney disease: Secondary | ICD-10-CM | POA: Diagnosis not present

## 2017-04-11 DIAGNOSIS — M1991 Primary osteoarthritis, unspecified site: Secondary | ICD-10-CM | POA: Diagnosis not present

## 2017-04-11 DIAGNOSIS — I5043 Acute on chronic combined systolic (congestive) and diastolic (congestive) heart failure: Secondary | ICD-10-CM | POA: Diagnosis not present

## 2017-04-11 DIAGNOSIS — J9621 Acute and chronic respiratory failure with hypoxia: Secondary | ICD-10-CM | POA: Diagnosis not present

## 2017-04-11 DIAGNOSIS — I2510 Atherosclerotic heart disease of native coronary artery without angina pectoris: Secondary | ICD-10-CM | POA: Diagnosis not present

## 2017-04-11 DIAGNOSIS — M109 Gout, unspecified: Secondary | ICD-10-CM | POA: Diagnosis not present

## 2017-04-11 DIAGNOSIS — M810 Age-related osteoporosis without current pathological fracture: Secondary | ICD-10-CM | POA: Diagnosis not present

## 2017-04-11 DIAGNOSIS — J449 Chronic obstructive pulmonary disease, unspecified: Secondary | ICD-10-CM | POA: Diagnosis not present

## 2017-04-11 DIAGNOSIS — N183 Chronic kidney disease, stage 3 (moderate): Secondary | ICD-10-CM | POA: Diagnosis not present

## 2017-04-13 DIAGNOSIS — I130 Hypertensive heart and chronic kidney disease with heart failure and stage 1 through stage 4 chronic kidney disease, or unspecified chronic kidney disease: Secondary | ICD-10-CM | POA: Diagnosis not present

## 2017-04-13 DIAGNOSIS — M1991 Primary osteoarthritis, unspecified site: Secondary | ICD-10-CM | POA: Diagnosis not present

## 2017-04-13 DIAGNOSIS — J9621 Acute and chronic respiratory failure with hypoxia: Secondary | ICD-10-CM | POA: Diagnosis not present

## 2017-04-13 DIAGNOSIS — J449 Chronic obstructive pulmonary disease, unspecified: Secondary | ICD-10-CM | POA: Diagnosis not present

## 2017-04-13 DIAGNOSIS — I739 Peripheral vascular disease, unspecified: Secondary | ICD-10-CM | POA: Diagnosis not present

## 2017-04-13 DIAGNOSIS — M109 Gout, unspecified: Secondary | ICD-10-CM | POA: Diagnosis not present

## 2017-04-13 DIAGNOSIS — M810 Age-related osteoporosis without current pathological fracture: Secondary | ICD-10-CM | POA: Diagnosis not present

## 2017-04-13 DIAGNOSIS — I5043 Acute on chronic combined systolic (congestive) and diastolic (congestive) heart failure: Secondary | ICD-10-CM | POA: Diagnosis not present

## 2017-04-13 DIAGNOSIS — N183 Chronic kidney disease, stage 3 (moderate): Secondary | ICD-10-CM | POA: Diagnosis not present

## 2017-04-13 DIAGNOSIS — I2510 Atherosclerotic heart disease of native coronary artery without angina pectoris: Secondary | ICD-10-CM | POA: Diagnosis not present

## 2017-04-13 DIAGNOSIS — M4800 Spinal stenosis, site unspecified: Secondary | ICD-10-CM | POA: Diagnosis not present

## 2017-04-15 ENCOUNTER — Ambulatory Visit (INDEPENDENT_AMBULATORY_CARE_PROVIDER_SITE_OTHER): Payer: Medicare Other | Admitting: Podiatry

## 2017-04-15 ENCOUNTER — Encounter: Payer: Self-pay | Admitting: Podiatry

## 2017-04-15 DIAGNOSIS — M79609 Pain in unspecified limb: Secondary | ICD-10-CM

## 2017-04-15 DIAGNOSIS — B351 Tinea unguium: Secondary | ICD-10-CM | POA: Diagnosis not present

## 2017-04-15 NOTE — Progress Notes (Signed)
Complaint:  Visit Type: Patient returns to my office for continued preventative foot care services. Complaint: Patient states" my nails have grown long and thick and become painful to walk and wear shoes". The patient presents for preventative foot care services. No changes to ROS.    Podiatric Exam: Vascular: dorsalis pedis and posterior tibial pulses are palpable bilateral. Capillary return is immediate. Temperature gradient is WNL. Skin turgor WNL  Sensorium: Normal Semmes Weinstein monofilament test. Normal tactile sensation bilaterally. Nail Exam: Pt has thick disfigured discolored nails with subungual debris noted bilateral entire nail hallux through fifth toenails.   Ulcer Exam: There is no evidence of ulcer or pre-ulcerative changes or infection. Orthopedic Exam: Muscle tone and strength are WNL. No limitations in general ROM. No crepitus or effusions noted. Foot type and digits show no abnormalities. Bony prominences are unremarkable. Skin: No Porokeratosis. No infection or ulcers  Diagnosis:  Onychomycosis, , Pain in right toe, pain in left toe    Treatment & Plan Procedures and Treatment: Consent by patient was obtained for treatment procedures. The patient understood the discussion of treatment and procedures well. All questions were answered thoroughly reviewed. Debridement of mycotic and hypertrophic toenails, 1 through 5 bilateral and clearing of subungual debris. No ulceration, no infection noted.   Return Visit-Office Procedure: Patient instructed to return to the office for a follow up visit 3 months for continued evaluation and treatment.    Gardiner Barefoot DPM

## 2017-04-16 DIAGNOSIS — M4800 Spinal stenosis, site unspecified: Secondary | ICD-10-CM | POA: Diagnosis not present

## 2017-04-16 DIAGNOSIS — I2510 Atherosclerotic heart disease of native coronary artery without angina pectoris: Secondary | ICD-10-CM | POA: Diagnosis not present

## 2017-04-16 DIAGNOSIS — I130 Hypertensive heart and chronic kidney disease with heart failure and stage 1 through stage 4 chronic kidney disease, or unspecified chronic kidney disease: Secondary | ICD-10-CM | POA: Diagnosis not present

## 2017-04-16 DIAGNOSIS — I739 Peripheral vascular disease, unspecified: Secondary | ICD-10-CM | POA: Diagnosis not present

## 2017-04-16 DIAGNOSIS — M810 Age-related osteoporosis without current pathological fracture: Secondary | ICD-10-CM | POA: Diagnosis not present

## 2017-04-16 DIAGNOSIS — M109 Gout, unspecified: Secondary | ICD-10-CM | POA: Diagnosis not present

## 2017-04-16 DIAGNOSIS — M1991 Primary osteoarthritis, unspecified site: Secondary | ICD-10-CM | POA: Diagnosis not present

## 2017-04-16 DIAGNOSIS — J9621 Acute and chronic respiratory failure with hypoxia: Secondary | ICD-10-CM | POA: Diagnosis not present

## 2017-04-16 DIAGNOSIS — N183 Chronic kidney disease, stage 3 (moderate): Secondary | ICD-10-CM | POA: Diagnosis not present

## 2017-04-16 DIAGNOSIS — I5043 Acute on chronic combined systolic (congestive) and diastolic (congestive) heart failure: Secondary | ICD-10-CM | POA: Diagnosis not present

## 2017-04-16 DIAGNOSIS — J449 Chronic obstructive pulmonary disease, unspecified: Secondary | ICD-10-CM | POA: Diagnosis not present

## 2017-04-17 DIAGNOSIS — N183 Chronic kidney disease, stage 3 (moderate): Secondary | ICD-10-CM | POA: Diagnosis not present

## 2017-04-17 DIAGNOSIS — J449 Chronic obstructive pulmonary disease, unspecified: Secondary | ICD-10-CM | POA: Diagnosis not present

## 2017-04-17 DIAGNOSIS — I130 Hypertensive heart and chronic kidney disease with heart failure and stage 1 through stage 4 chronic kidney disease, or unspecified chronic kidney disease: Secondary | ICD-10-CM | POA: Diagnosis not present

## 2017-04-17 DIAGNOSIS — I5043 Acute on chronic combined systolic (congestive) and diastolic (congestive) heart failure: Secondary | ICD-10-CM | POA: Diagnosis not present

## 2017-04-17 DIAGNOSIS — J9621 Acute and chronic respiratory failure with hypoxia: Secondary | ICD-10-CM | POA: Diagnosis not present

## 2017-04-17 DIAGNOSIS — M4800 Spinal stenosis, site unspecified: Secondary | ICD-10-CM | POA: Diagnosis not present

## 2017-04-17 DIAGNOSIS — M109 Gout, unspecified: Secondary | ICD-10-CM | POA: Diagnosis not present

## 2017-04-17 DIAGNOSIS — M810 Age-related osteoporosis without current pathological fracture: Secondary | ICD-10-CM | POA: Diagnosis not present

## 2017-04-17 DIAGNOSIS — M1991 Primary osteoarthritis, unspecified site: Secondary | ICD-10-CM | POA: Diagnosis not present

## 2017-04-17 DIAGNOSIS — I739 Peripheral vascular disease, unspecified: Secondary | ICD-10-CM | POA: Diagnosis not present

## 2017-04-17 DIAGNOSIS — I2510 Atherosclerotic heart disease of native coronary artery without angina pectoris: Secondary | ICD-10-CM | POA: Diagnosis not present

## 2017-04-20 DIAGNOSIS — I2510 Atherosclerotic heart disease of native coronary artery without angina pectoris: Secondary | ICD-10-CM | POA: Diagnosis not present

## 2017-04-20 DIAGNOSIS — I5043 Acute on chronic combined systolic (congestive) and diastolic (congestive) heart failure: Secondary | ICD-10-CM | POA: Diagnosis not present

## 2017-04-20 DIAGNOSIS — I130 Hypertensive heart and chronic kidney disease with heart failure and stage 1 through stage 4 chronic kidney disease, or unspecified chronic kidney disease: Secondary | ICD-10-CM | POA: Diagnosis not present

## 2017-04-20 DIAGNOSIS — N183 Chronic kidney disease, stage 3 (moderate): Secondary | ICD-10-CM | POA: Diagnosis not present

## 2017-04-20 DIAGNOSIS — M109 Gout, unspecified: Secondary | ICD-10-CM | POA: Diagnosis not present

## 2017-04-20 DIAGNOSIS — M4800 Spinal stenosis, site unspecified: Secondary | ICD-10-CM | POA: Diagnosis not present

## 2017-04-20 DIAGNOSIS — I739 Peripheral vascular disease, unspecified: Secondary | ICD-10-CM | POA: Diagnosis not present

## 2017-04-20 DIAGNOSIS — M810 Age-related osteoporosis without current pathological fracture: Secondary | ICD-10-CM | POA: Diagnosis not present

## 2017-04-20 DIAGNOSIS — J9621 Acute and chronic respiratory failure with hypoxia: Secondary | ICD-10-CM | POA: Diagnosis not present

## 2017-04-20 DIAGNOSIS — J449 Chronic obstructive pulmonary disease, unspecified: Secondary | ICD-10-CM | POA: Diagnosis not present

## 2017-04-20 DIAGNOSIS — M1991 Primary osteoarthritis, unspecified site: Secondary | ICD-10-CM | POA: Diagnosis not present

## 2017-04-21 DIAGNOSIS — Z9011 Acquired absence of right breast and nipple: Secondary | ICD-10-CM | POA: Diagnosis not present

## 2017-04-21 DIAGNOSIS — I2510 Atherosclerotic heart disease of native coronary artery without angina pectoris: Secondary | ICD-10-CM | POA: Diagnosis not present

## 2017-04-21 DIAGNOSIS — G629 Polyneuropathy, unspecified: Secondary | ICD-10-CM | POA: Diagnosis not present

## 2017-04-21 DIAGNOSIS — Z7982 Long term (current) use of aspirin: Secondary | ICD-10-CM

## 2017-04-21 DIAGNOSIS — I5042 Chronic combined systolic (congestive) and diastolic (congestive) heart failure: Secondary | ICD-10-CM | POA: Diagnosis not present

## 2017-04-21 DIAGNOSIS — R2681 Unsteadiness on feet: Secondary | ICD-10-CM | POA: Diagnosis not present

## 2017-04-21 DIAGNOSIS — I110 Hypertensive heart disease with heart failure: Secondary | ICD-10-CM | POA: Diagnosis not present

## 2017-04-21 DIAGNOSIS — M1991 Primary osteoarthritis, unspecified site: Secondary | ICD-10-CM | POA: Diagnosis not present

## 2017-04-21 DIAGNOSIS — Z87891 Personal history of nicotine dependence: Secondary | ICD-10-CM

## 2017-04-21 DIAGNOSIS — E785 Hyperlipidemia, unspecified: Secondary | ICD-10-CM

## 2017-04-21 DIAGNOSIS — Z8781 Personal history of (healed) traumatic fracture: Secondary | ICD-10-CM

## 2017-04-21 DIAGNOSIS — J449 Chronic obstructive pulmonary disease, unspecified: Secondary | ICD-10-CM | POA: Diagnosis not present

## 2017-04-21 DIAGNOSIS — Z955 Presence of coronary angioplasty implant and graft: Secondary | ICD-10-CM

## 2017-04-21 DIAGNOSIS — F329 Major depressive disorder, single episode, unspecified: Secondary | ICD-10-CM

## 2017-04-21 DIAGNOSIS — I739 Peripheral vascular disease, unspecified: Secondary | ICD-10-CM | POA: Diagnosis not present

## 2017-04-21 DIAGNOSIS — F419 Anxiety disorder, unspecified: Secondary | ICD-10-CM | POA: Diagnosis not present

## 2017-04-21 DIAGNOSIS — Z853 Personal history of malignant neoplasm of breast: Secondary | ICD-10-CM

## 2017-04-22 DIAGNOSIS — M4800 Spinal stenosis, site unspecified: Secondary | ICD-10-CM | POA: Diagnosis not present

## 2017-04-22 DIAGNOSIS — I2510 Atherosclerotic heart disease of native coronary artery without angina pectoris: Secondary | ICD-10-CM | POA: Diagnosis not present

## 2017-04-22 DIAGNOSIS — N183 Chronic kidney disease, stage 3 (moderate): Secondary | ICD-10-CM | POA: Diagnosis not present

## 2017-04-22 DIAGNOSIS — J449 Chronic obstructive pulmonary disease, unspecified: Secondary | ICD-10-CM | POA: Diagnosis not present

## 2017-04-22 DIAGNOSIS — I5043 Acute on chronic combined systolic (congestive) and diastolic (congestive) heart failure: Secondary | ICD-10-CM | POA: Diagnosis not present

## 2017-04-22 DIAGNOSIS — I130 Hypertensive heart and chronic kidney disease with heart failure and stage 1 through stage 4 chronic kidney disease, or unspecified chronic kidney disease: Secondary | ICD-10-CM | POA: Diagnosis not present

## 2017-04-22 DIAGNOSIS — M1991 Primary osteoarthritis, unspecified site: Secondary | ICD-10-CM | POA: Diagnosis not present

## 2017-04-22 DIAGNOSIS — J9621 Acute and chronic respiratory failure with hypoxia: Secondary | ICD-10-CM | POA: Diagnosis not present

## 2017-04-22 DIAGNOSIS — M810 Age-related osteoporosis without current pathological fracture: Secondary | ICD-10-CM | POA: Diagnosis not present

## 2017-04-22 DIAGNOSIS — M109 Gout, unspecified: Secondary | ICD-10-CM | POA: Diagnosis not present

## 2017-04-22 DIAGNOSIS — I739 Peripheral vascular disease, unspecified: Secondary | ICD-10-CM | POA: Diagnosis not present

## 2017-04-24 DIAGNOSIS — I5020 Unspecified systolic (congestive) heart failure: Secondary | ICD-10-CM | POA: Diagnosis not present

## 2017-04-27 ENCOUNTER — Encounter: Payer: Self-pay | Admitting: Internal Medicine

## 2017-04-27 ENCOUNTER — Ambulatory Visit (INDEPENDENT_AMBULATORY_CARE_PROVIDER_SITE_OTHER): Payer: Medicare Other | Admitting: Internal Medicine

## 2017-04-27 VITALS — BP 100/60 | HR 84 | Temp 97.9°F | Wt 179.0 lb

## 2017-04-27 DIAGNOSIS — M1991 Primary osteoarthritis, unspecified site: Secondary | ICD-10-CM | POA: Diagnosis not present

## 2017-04-27 DIAGNOSIS — N183 Chronic kidney disease, stage 3 (moderate): Secondary | ICD-10-CM | POA: Diagnosis not present

## 2017-04-27 DIAGNOSIS — I5042 Chronic combined systolic (congestive) and diastolic (congestive) heart failure: Secondary | ICD-10-CM | POA: Diagnosis not present

## 2017-04-27 DIAGNOSIS — I5043 Acute on chronic combined systolic (congestive) and diastolic (congestive) heart failure: Secondary | ICD-10-CM | POA: Diagnosis not present

## 2017-04-27 DIAGNOSIS — M4800 Spinal stenosis, site unspecified: Secondary | ICD-10-CM | POA: Diagnosis not present

## 2017-04-27 DIAGNOSIS — J449 Chronic obstructive pulmonary disease, unspecified: Secondary | ICD-10-CM | POA: Diagnosis not present

## 2017-04-27 DIAGNOSIS — M109 Gout, unspecified: Secondary | ICD-10-CM | POA: Diagnosis not present

## 2017-04-27 DIAGNOSIS — I739 Peripheral vascular disease, unspecified: Secondary | ICD-10-CM | POA: Diagnosis not present

## 2017-04-27 DIAGNOSIS — I130 Hypertensive heart and chronic kidney disease with heart failure and stage 1 through stage 4 chronic kidney disease, or unspecified chronic kidney disease: Secondary | ICD-10-CM | POA: Diagnosis not present

## 2017-04-27 DIAGNOSIS — J9621 Acute and chronic respiratory failure with hypoxia: Secondary | ICD-10-CM | POA: Diagnosis not present

## 2017-04-27 DIAGNOSIS — I2510 Atherosclerotic heart disease of native coronary artery without angina pectoris: Secondary | ICD-10-CM | POA: Diagnosis not present

## 2017-04-27 DIAGNOSIS — M810 Age-related osteoporosis without current pathological fracture: Secondary | ICD-10-CM | POA: Diagnosis not present

## 2017-04-27 NOTE — Assessment & Plan Note (Addendum)
Doing better now Now at or close to dry weight Discussed increased diuretic on trip or if weight above today's level No other changes needed Using oxygen most of the time Forms done for handicapped placard and [employer] (for oxygen concentrator)

## 2017-04-27 NOTE — Progress Notes (Signed)
Subjective:    Patient ID: Haley Harris, female    DOB: 03/28/1929, 83 y.o.   MRN: [id number]  HPI Here with daughter for follow up of CHF and other medical problems  Did have a fall about 2 weeks ago Stumbled on carpet--- tripped on air purifier and bruised left arm, cut on leg, etc Needed grandson to help her cut up Was using the walker but was barefoot (does better with shoes due to her neuropathy---just shuffles)  Breathing is good Weighing every day-- 176# at home Back to 1 furosemide daily--- taking the second if weight over 178#  Sleeps in bed--fairly flat No PND Using the oxygen for sleep, any activity  Will occasionally remove if just sitting quietly No chest pain No palpitations  Current Outpatient Prescriptions on File Prior to Visit  Medication Sig Dispense Refill  . acetaminophen (TYLENOL) 500 MG tablet Take 1,000 mg by mouth every 8 (eight) hours as needed for mild pain.     Marland Kitchen albuterol (PROVENTIL) (2.5 MG/3ML) 0.083% nebulizer solution Take 3 mLs (2.5 mg total) by nebulization every 6 (six) hours as needed for wheezing or shortness of breath. 540 mL 3  . ALPRAZolam (XANAX) 0.25 MG tablet Take 0.5-1 tablets (0.125-0.25 mg total) by mouth 2 (two) times daily as needed for anxiety. 20 tablet 0  . aspirin 81 MG tablet Take 81 mg by mouth every evening.     Marland Kitchen atorvastatin (LIPITOR) 40 MG tablet Take 1 tablet (40 mg total) by mouth daily at 6 PM. 90 tablet 1  . bisacodyl (DULCOLAX) 5 MG EC tablet Take 2 tablets (10 mg total) by mouth daily as needed for moderate constipation. 30 tablet 0  . Calcium Carbonate-Vitamin D (CALTRATE 600+D PO) Take 1 tablet by mouth daily after lunch.     . carvedilol (COREG) 6.25 MG tablet Take 1 tablet (6.25 mg total) by mouth 2 (two) times daily with a meal. Take 1 tablet by mouth two  times daily with a meal 60 tablet 3  . escitalopram (LEXAPRO) 10 MG tablet TAKE 1 TABLET BY MOUTH  DAILY 90 tablet 3  . famotidine (PEPCID) 20 MG tablet Take  1 tablet (20 mg total) by mouth 2 (two) times daily. 180 tablet 3  . fluticasone (FLONASE) 50 MCG/ACT nasal spray Use 2 sprays in each  nostril daily 48 g 3  . furosemide (LASIX) 40 MG tablet Take 1 tablet (40 mg total) by mouth daily. Take 1 pill twice daily if weight has elevated. 135 tablet 3  . gabapentin (NEURONTIN) 100 MG capsule TAKE 1 CAPSULE BY MOUTH TWO TIMES DAILY 180 capsule 3  . losartan (COZAAR) 50 MG tablet TAKE 1 TABLET BY MOUTH  DAILY 90 tablet 3  . Melatonin 3 MG TABS Take 1 tablet by mouth at bedtime.     . montelukast (SINGULAIR) 10 MG tablet TAKE 1 TABLET BY MOUTH  DAILY 90 tablet 3  . Morphine Sulfate (MORPHINE CONCENTRATE) 10 mg / 0.5 ml concentrated solution Place 0.25-0.5 mLs (5-10 mg total) under the tongue every 2 (two) hours as needed for severe pain or shortness of breath. 30 mL 0  . Multiple Vitamin (MULITIVITAMIN WITH MINERALS) TABS Take 1 tablet by mouth daily after lunch.     . nitroGLYCERIN (NITROSTAT) 0.4 MG SL tablet Place 1 tablet (0.4 mg total) under the tongue every 5 (five) minutes x 3 doses as needed for chest pain. 25 tablet 2  . PROAIR HFA 108 (90 Base) MCG/ACT inhaler  USE 2 PUFFS EVERY 6 HOURS AS NEEDED FOR WHEEZING OR SHORTNESS OF BREATH 8.5 g 5  . spironolactone (ALDACTONE) 25 MG tablet TAKE 1 TABLET BY MOUTH  DAILY 90 tablet 3  . vitamin B-12 (CYANOCOBALAMIN) 500 MCG tablet Take 1,000 mcg by mouth 3 (three) times a week. Mon, Wed, Fri     No current facility-administered medications on file prior to visit.     Allergies  Allergen Reactions  . Amoxicillin-Pot Clavulanate Anaphylaxis  . Penicillins Anaphylaxis  . Cephalexin Other (See Comments)    Reaction unknown  . Clarithromycin Other (See Comments)    Reaction unknown  . Lidocaine     Patient had tremors following use of lidocaine pain patches  . Nifedipine Other (See Comments)    Reaction unknown  . Nsaids Other (See Comments)    Heart issue  . Olmesartan Medoxomil     REACTION: cough;   But tolerating Losartan 02/2014  . Tramadol Hcl Other (See Comments)    loopy    Past Medical History:  Diagnosis Date  . ACE-inhibitor cough   . Allergic rhinitis, cause unspecified   . Angina   . Anxiety   . CHF (congestive heart failure) ([place])   . Chronic airway obstruction, not elsewhere classified   . Compression fracture of T12 vertebra ([place]) 10/28/2011  . Depressive disorder, not elsewhere classified   . Esophageal reflux   . Gout   . Heart murmur    aS CHILD  . Malignant neoplasm of breast (female), unspecified site   . Myocardial infarct ([place])   . Neuromuscular disorder ([place])    NEROPATHY FROM STENOSIS  . Occlusion and stenosis of carotid artery without mention of cerebral infarction   . Osteoarthrosis, unspecified whether generalized or localized, unspecified site   . Osteoporosis, unspecified   . Other and unspecified hyperlipidemia   . Other dyspnea and respiratory abnormality   . Oxygen dependent   . Peripheral vascular disease, unspecified ([place])   . Personal history of malignant neoplasm of breast   . Pneumonia   . Recurrent upper respiratory infection (URI)   . Shortness of breath   . Spinal stenosis   . Unspecified cardiovascular disease   . Unspecified essential hypertension   . Unspecified hearing loss   . Unspecified urinary incontinence     Past Surgical History:  Procedure Laterality Date  . ABDOMINAL HYSTERECTOMY    . APPENDECTOMY  1948  . BREAST IMPLANT REMOVAL  06/12/09   right  . BREAST RECONSTRUCTION  1998   Reconstruction   . CARDIAC CATHETERIZATION N/A 01/25/2015   Procedure: Left Heart Cath and Coronary Angiography;  Surgeon: Charolette Forward, MD;  Location: [facility];  Service: Cardiovascular;  Laterality: N/A;  . CORONARY ANGIOPLASTY  11/12   distal RCA  . FLEXIBLE SIGMOIDOSCOPY N/A 01/21/2015   Procedure: FLEXIBLE SIGMOIDOSCOPY;  Surgeon: Richmond Campbell, MD;  Location: [facility] ENDOSCOPY;  Service: Endoscopy;  Laterality: N/A;  .  FLEXIBLE SIGMOIDOSCOPY  01/22/2015      . FRACTURE SURGERY     fracture right elbow  . LEFT HEART CATH AND CORONARY ANGIOGRAPHY N/A 11/14/2016   Procedure: Left Heart Cath and Coronary Angiography;  Surgeon: Charolette Forward, MD;  Location: [facility];  Service: Cardiovascular;  Laterality: N/A;  . LEFT HEART CATHETERIZATION WITH CORONARY ANGIOGRAM N/A 06/05/2011   Procedure: LEFT HEART CATHETERIZATION WITH CORONARY ANGIOGRAM;  Surgeon: Clent Demark, MD;  Location: [facility] LAB;  Service: Cardiovascular;  Laterality: N/A;  .  LEFT HEART CATHETERIZATION WITH CORONARY ANGIOGRAM N/A 03/05/2014   Procedure: LEFT HEART CATHETERIZATION WITH CORONARY ANGIOGRAM;  Surgeon: Clent Demark, MD;  Location: [facility] CATH LAB;  Service: Cardiovascular;  Laterality: N/A;  . LUMBAR LAMINECTOMY  2003  . MASTECTOMY  1987   Right  . MASTECTOMY    . MASTOIDECTOMY  childhood  . REDUCTION MAMMAPLASTY  1998   Left  . SHOULDER SURGERY  02/2005   Bilateral fractures with multiple surgeries    Family History  Problem Relation Age of Onset  . Heart attack Father 33  . Cancer Mother        ovarian  . Cancer Brother        lung  . Arthritis Unknown        family    Social History   Social History  . Marital status: Widowed    Spouse name: N/A  . Number of children: 2  . Years of education: N/A   Occupational History  . 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  [employer]   Social History Main Topics  . Smoking status: Former Smoker    Quit date: 07/13/1974  . Smokeless tobacco: Never Used  . Alcohol use 0.0 oz/week     Comment: occasional  . Drug use: No  . Sexual activity: No   Other Topics Concern  . Not on file   Social History Narrative   Has living will   Son or daughter would be health care POA.   Requests DNR--written 03/02/13   No tube feeds if cognitively unaware   Review of Systems  Appetite is not great--other than sweets Leaving in 3 days--going to [employer] for  hotel (will have portable oxygen)          Objective:   Physical Exam  Constitutional: No distress.  Cardiovascular: Normal rate, regular rhythm and normal heart sounds.  Exam reveals no gallop.   No murmur heard. Pulmonary/Chest: Effort normal and breath sounds normal. No respiratory distress. She has no wheezes. She has no rales.  No dullness  Musculoskeletal: She exhibits no edema.          Assessment & Plan:

## 2017-04-28 DIAGNOSIS — M810 Age-related osteoporosis without current pathological fracture: Secondary | ICD-10-CM | POA: Diagnosis not present

## 2017-04-28 DIAGNOSIS — M1991 Primary osteoarthritis, unspecified site: Secondary | ICD-10-CM | POA: Diagnosis not present

## 2017-04-28 DIAGNOSIS — M4800 Spinal stenosis, site unspecified: Secondary | ICD-10-CM | POA: Diagnosis not present

## 2017-04-28 DIAGNOSIS — I739 Peripheral vascular disease, unspecified: Secondary | ICD-10-CM | POA: Diagnosis not present

## 2017-04-28 DIAGNOSIS — N183 Chronic kidney disease, stage 3 (moderate): Secondary | ICD-10-CM | POA: Diagnosis not present

## 2017-04-28 DIAGNOSIS — M109 Gout, unspecified: Secondary | ICD-10-CM | POA: Diagnosis not present

## 2017-04-28 DIAGNOSIS — I5043 Acute on chronic combined systolic (congestive) and diastolic (congestive) heart failure: Secondary | ICD-10-CM | POA: Diagnosis not present

## 2017-04-28 DIAGNOSIS — J9621 Acute and chronic respiratory failure with hypoxia: Secondary | ICD-10-CM | POA: Diagnosis not present

## 2017-04-28 DIAGNOSIS — I130 Hypertensive heart and chronic kidney disease with heart failure and stage 1 through stage 4 chronic kidney disease, or unspecified chronic kidney disease: Secondary | ICD-10-CM | POA: Diagnosis not present

## 2017-04-28 DIAGNOSIS — J449 Chronic obstructive pulmonary disease, unspecified: Secondary | ICD-10-CM | POA: Diagnosis not present

## 2017-04-28 DIAGNOSIS — I2510 Atherosclerotic heart disease of native coronary artery without angina pectoris: Secondary | ICD-10-CM | POA: Diagnosis not present

## 2017-04-29 DIAGNOSIS — J449 Chronic obstructive pulmonary disease, unspecified: Secondary | ICD-10-CM | POA: Diagnosis not present

## 2017-04-29 DIAGNOSIS — I130 Hypertensive heart and chronic kidney disease with heart failure and stage 1 through stage 4 chronic kidney disease, or unspecified chronic kidney disease: Secondary | ICD-10-CM | POA: Diagnosis not present

## 2017-04-29 DIAGNOSIS — I2510 Atherosclerotic heart disease of native coronary artery without angina pectoris: Secondary | ICD-10-CM | POA: Diagnosis not present

## 2017-04-29 DIAGNOSIS — M4800 Spinal stenosis, site unspecified: Secondary | ICD-10-CM | POA: Diagnosis not present

## 2017-04-29 DIAGNOSIS — I5043 Acute on chronic combined systolic (congestive) and diastolic (congestive) heart failure: Secondary | ICD-10-CM | POA: Diagnosis not present

## 2017-04-29 DIAGNOSIS — N183 Chronic kidney disease, stage 3 (moderate): Secondary | ICD-10-CM | POA: Diagnosis not present

## 2017-04-29 DIAGNOSIS — M810 Age-related osteoporosis without current pathological fracture: Secondary | ICD-10-CM | POA: Diagnosis not present

## 2017-04-29 DIAGNOSIS — M109 Gout, unspecified: Secondary | ICD-10-CM | POA: Diagnosis not present

## 2017-04-29 DIAGNOSIS — M1991 Primary osteoarthritis, unspecified site: Secondary | ICD-10-CM | POA: Diagnosis not present

## 2017-04-29 DIAGNOSIS — I739 Peripheral vascular disease, unspecified: Secondary | ICD-10-CM | POA: Diagnosis not present

## 2017-04-29 DIAGNOSIS — J9621 Acute and chronic respiratory failure with hypoxia: Secondary | ICD-10-CM | POA: Diagnosis not present

## 2017-05-05 DIAGNOSIS — M810 Age-related osteoporosis without current pathological fracture: Secondary | ICD-10-CM | POA: Diagnosis not present

## 2017-05-05 DIAGNOSIS — J449 Chronic obstructive pulmonary disease, unspecified: Secondary | ICD-10-CM | POA: Diagnosis not present

## 2017-05-05 DIAGNOSIS — M4800 Spinal stenosis, site unspecified: Secondary | ICD-10-CM | POA: Diagnosis not present

## 2017-05-05 DIAGNOSIS — M109 Gout, unspecified: Secondary | ICD-10-CM | POA: Diagnosis not present

## 2017-05-05 DIAGNOSIS — I5043 Acute on chronic combined systolic (congestive) and diastolic (congestive) heart failure: Secondary | ICD-10-CM | POA: Diagnosis not present

## 2017-05-05 DIAGNOSIS — I130 Hypertensive heart and chronic kidney disease with heart failure and stage 1 through stage 4 chronic kidney disease, or unspecified chronic kidney disease: Secondary | ICD-10-CM | POA: Diagnosis not present

## 2017-05-05 DIAGNOSIS — N183 Chronic kidney disease, stage 3 (moderate): Secondary | ICD-10-CM | POA: Diagnosis not present

## 2017-05-05 DIAGNOSIS — M1991 Primary osteoarthritis, unspecified site: Secondary | ICD-10-CM | POA: Diagnosis not present

## 2017-05-05 DIAGNOSIS — I739 Peripheral vascular disease, unspecified: Secondary | ICD-10-CM | POA: Diagnosis not present

## 2017-05-05 DIAGNOSIS — I2510 Atherosclerotic heart disease of native coronary artery without angina pectoris: Secondary | ICD-10-CM | POA: Diagnosis not present

## 2017-05-05 DIAGNOSIS — J9621 Acute and chronic respiratory failure with hypoxia: Secondary | ICD-10-CM | POA: Diagnosis not present

## 2017-05-06 DIAGNOSIS — I5043 Acute on chronic combined systolic (congestive) and diastolic (congestive) heart failure: Secondary | ICD-10-CM | POA: Diagnosis not present

## 2017-05-06 DIAGNOSIS — J9621 Acute and chronic respiratory failure with hypoxia: Secondary | ICD-10-CM | POA: Diagnosis not present

## 2017-05-06 DIAGNOSIS — I2510 Atherosclerotic heart disease of native coronary artery without angina pectoris: Secondary | ICD-10-CM | POA: Diagnosis not present

## 2017-05-06 DIAGNOSIS — M810 Age-related osteoporosis without current pathological fracture: Secondary | ICD-10-CM | POA: Diagnosis not present

## 2017-05-06 DIAGNOSIS — N183 Chronic kidney disease, stage 3 (moderate): Secondary | ICD-10-CM | POA: Diagnosis not present

## 2017-05-06 DIAGNOSIS — M109 Gout, unspecified: Secondary | ICD-10-CM | POA: Diagnosis not present

## 2017-05-06 DIAGNOSIS — I739 Peripheral vascular disease, unspecified: Secondary | ICD-10-CM | POA: Diagnosis not present

## 2017-05-06 DIAGNOSIS — J449 Chronic obstructive pulmonary disease, unspecified: Secondary | ICD-10-CM | POA: Diagnosis not present

## 2017-05-06 DIAGNOSIS — I130 Hypertensive heart and chronic kidney disease with heart failure and stage 1 through stage 4 chronic kidney disease, or unspecified chronic kidney disease: Secondary | ICD-10-CM | POA: Diagnosis not present

## 2017-05-06 DIAGNOSIS — M1991 Primary osteoarthritis, unspecified site: Secondary | ICD-10-CM | POA: Diagnosis not present

## 2017-05-06 DIAGNOSIS — M4800 Spinal stenosis, site unspecified: Secondary | ICD-10-CM | POA: Diagnosis not present

## 2017-05-07 DIAGNOSIS — M810 Age-related osteoporosis without current pathological fracture: Secondary | ICD-10-CM | POA: Diagnosis not present

## 2017-05-07 DIAGNOSIS — M109 Gout, unspecified: Secondary | ICD-10-CM | POA: Diagnosis not present

## 2017-05-07 DIAGNOSIS — I5043 Acute on chronic combined systolic (congestive) and diastolic (congestive) heart failure: Secondary | ICD-10-CM | POA: Diagnosis not present

## 2017-05-07 DIAGNOSIS — N183 Chronic kidney disease, stage 3 (moderate): Secondary | ICD-10-CM | POA: Diagnosis not present

## 2017-05-07 DIAGNOSIS — I739 Peripheral vascular disease, unspecified: Secondary | ICD-10-CM | POA: Diagnosis not present

## 2017-05-07 DIAGNOSIS — M1991 Primary osteoarthritis, unspecified site: Secondary | ICD-10-CM | POA: Diagnosis not present

## 2017-05-07 DIAGNOSIS — J449 Chronic obstructive pulmonary disease, unspecified: Secondary | ICD-10-CM | POA: Diagnosis not present

## 2017-05-07 DIAGNOSIS — J9621 Acute and chronic respiratory failure with hypoxia: Secondary | ICD-10-CM | POA: Diagnosis not present

## 2017-05-07 DIAGNOSIS — M4800 Spinal stenosis, site unspecified: Secondary | ICD-10-CM | POA: Diagnosis not present

## 2017-05-07 DIAGNOSIS — I2510 Atherosclerotic heart disease of native coronary artery without angina pectoris: Secondary | ICD-10-CM | POA: Diagnosis not present

## 2017-05-07 DIAGNOSIS — I130 Hypertensive heart and chronic kidney disease with heart failure and stage 1 through stage 4 chronic kidney disease, or unspecified chronic kidney disease: Secondary | ICD-10-CM | POA: Diagnosis not present

## 2017-05-11 ENCOUNTER — Other Ambulatory Visit: Payer: Self-pay | Admitting: Internal Medicine

## 2017-05-11 DIAGNOSIS — M4800 Spinal stenosis, site unspecified: Secondary | ICD-10-CM | POA: Diagnosis not present

## 2017-05-11 DIAGNOSIS — J9621 Acute and chronic respiratory failure with hypoxia: Secondary | ICD-10-CM | POA: Diagnosis not present

## 2017-05-11 DIAGNOSIS — M109 Gout, unspecified: Secondary | ICD-10-CM | POA: Diagnosis not present

## 2017-05-11 DIAGNOSIS — J449 Chronic obstructive pulmonary disease, unspecified: Secondary | ICD-10-CM | POA: Diagnosis not present

## 2017-05-11 DIAGNOSIS — N183 Chronic kidney disease, stage 3 (moderate): Secondary | ICD-10-CM | POA: Diagnosis not present

## 2017-05-11 DIAGNOSIS — I130 Hypertensive heart and chronic kidney disease with heart failure and stage 1 through stage 4 chronic kidney disease, or unspecified chronic kidney disease: Secondary | ICD-10-CM | POA: Diagnosis not present

## 2017-05-11 DIAGNOSIS — I2510 Atherosclerotic heart disease of native coronary artery without angina pectoris: Secondary | ICD-10-CM | POA: Diagnosis not present

## 2017-05-11 DIAGNOSIS — I5043 Acute on chronic combined systolic (congestive) and diastolic (congestive) heart failure: Secondary | ICD-10-CM | POA: Diagnosis not present

## 2017-05-11 DIAGNOSIS — I739 Peripheral vascular disease, unspecified: Secondary | ICD-10-CM | POA: Diagnosis not present

## 2017-05-11 DIAGNOSIS — M810 Age-related osteoporosis without current pathological fracture: Secondary | ICD-10-CM | POA: Diagnosis not present

## 2017-05-11 DIAGNOSIS — M1991 Primary osteoarthritis, unspecified site: Secondary | ICD-10-CM | POA: Diagnosis not present

## 2017-05-12 ENCOUNTER — Telehealth: Payer: Self-pay | Admitting: Internal Medicine

## 2017-05-12 DIAGNOSIS — M1991 Primary osteoarthritis, unspecified site: Secondary | ICD-10-CM | POA: Diagnosis not present

## 2017-05-12 DIAGNOSIS — J449 Chronic obstructive pulmonary disease, unspecified: Secondary | ICD-10-CM | POA: Diagnosis not present

## 2017-05-12 DIAGNOSIS — I2510 Atherosclerotic heart disease of native coronary artery without angina pectoris: Secondary | ICD-10-CM | POA: Diagnosis not present

## 2017-05-12 DIAGNOSIS — N183 Chronic kidney disease, stage 3 (moderate): Secondary | ICD-10-CM | POA: Diagnosis not present

## 2017-05-12 DIAGNOSIS — I5043 Acute on chronic combined systolic (congestive) and diastolic (congestive) heart failure: Secondary | ICD-10-CM | POA: Diagnosis not present

## 2017-05-12 DIAGNOSIS — I739 Peripheral vascular disease, unspecified: Secondary | ICD-10-CM | POA: Diagnosis not present

## 2017-05-12 DIAGNOSIS — J9621 Acute and chronic respiratory failure with hypoxia: Secondary | ICD-10-CM | POA: Diagnosis not present

## 2017-05-12 DIAGNOSIS — M109 Gout, unspecified: Secondary | ICD-10-CM | POA: Diagnosis not present

## 2017-05-12 DIAGNOSIS — M4800 Spinal stenosis, site unspecified: Secondary | ICD-10-CM | POA: Diagnosis not present

## 2017-05-12 DIAGNOSIS — I130 Hypertensive heart and chronic kidney disease with heart failure and stage 1 through stage 4 chronic kidney disease, or unspecified chronic kidney disease: Secondary | ICD-10-CM | POA: Diagnosis not present

## 2017-05-12 DIAGNOSIS — M810 Age-related osteoporosis without current pathological fracture: Secondary | ICD-10-CM | POA: Diagnosis not present

## 2017-05-12 NOTE — Telephone Encounter (Signed)
Copied from [place] #[id number]. Topic: Quick Communication - See Telephone Encounter >> May 12, 2017 12:53 PM Cleaster Corin, NT wrote: CRM for notification. See Telephone encounter for:  05/12/17. Jerold Coombe from [facility] called to request rectification for at home visits she can be reached at [phone] you can leave a voicemail if she does not answer

## 2017-05-12 NOTE — Telephone Encounter (Signed)
Okay but confirm exactly what we would be recertifiying

## 2017-05-12 NOTE — Telephone Encounter (Signed)
Left a message for Haley Harris asking her to call back and clarify what she is needing recertified and for how long.

## 2017-05-13 ENCOUNTER — Other Ambulatory Visit: Payer: Self-pay | Admitting: Family Medicine

## 2017-05-13 ENCOUNTER — Telehealth: Payer: Self-pay | Admitting: Internal Medicine

## 2017-05-13 ENCOUNTER — Ambulatory Visit (INDEPENDENT_AMBULATORY_CARE_PROVIDER_SITE_OTHER): Payer: Medicare Other | Admitting: Family Medicine

## 2017-05-13 ENCOUNTER — Ambulatory Visit: Payer: Medicare Other | Admitting: Family Medicine

## 2017-05-13 ENCOUNTER — Encounter: Payer: Self-pay | Admitting: Family Medicine

## 2017-05-13 ENCOUNTER — Ambulatory Visit: Payer: Self-pay | Admitting: *Deleted

## 2017-05-13 ENCOUNTER — Ambulatory Visit (INDEPENDENT_AMBULATORY_CARE_PROVIDER_SITE_OTHER)
Admission: RE | Admit: 2017-05-13 | Discharge: 2017-05-13 | Disposition: A | Discharge status: Home / Self Care | Payer: Medicare Other | Source: Ambulatory Visit | Attending: Family Medicine | Admitting: Family Medicine

## 2017-05-13 VITALS — BP 118/64 | HR 94 | Temp 98.2°F | Ht 66.5 in | Wt 174.0 lb

## 2017-05-13 DIAGNOSIS — R197 Diarrhea, unspecified: Secondary | ICD-10-CM

## 2017-05-13 DIAGNOSIS — M545 Low back pain, unspecified: Secondary | ICD-10-CM

## 2017-05-13 MED ORDER — BACLOFEN 5 MG PO TABS: 5.0000 mg | ORAL_TABLET | Freq: Every day | ORAL | 0 refills | Status: DC

## 2017-05-13 MED ORDER — LOPERAMIDE HCL 2 MG PO TABS: 2.0000 mg | ORAL_TABLET | Freq: Four times a day (QID) | ORAL | 0 refills | Status: DC | PRN

## 2017-05-13 MED ORDER — CIPROFLOXACIN HCL 750 MG PO TABS: 750.0000 mg | ORAL_TABLET | Freq: Once | ORAL | 0 refills | Status: AC

## 2017-05-13 NOTE — Telephone Encounter (Signed)
ACUTE   APPOINTMENT  VISIT  MADE  WITH   DR  Raeford Razor   AT  [facility]  AT   300  PM     Reason for Disposition . [1] MODERATE diarrhea (e.g., 4-6 times / day more than normal) AND [2] present > 48 hours (2 days)  Answer Assessment - Initial Assessment Questions 1. DIARRHEA SEVERITY: "How bad is the diarrhea?" "How many extra stools have you had in the past 24 hours than normal?"    - MILD: Few loose or mushy BMs; increase of 1-3 stools over normal daily number of stools; mild increase in ostomy output.   - MODERATE: Increase of 4-6 stools daily over normal; moderate increase in ostomy output.   - SEVERE (or Worst Possible): Increase of 7 or more stools daily over normal; moderate increase in ostomy output; incontinence.     3   Times   2. ONSET: "When did the diarrhea begin?"       1   Week   3. BM CONSISTENCY: "How loose or watery is the diarrhea?"       Watery  At  Times   Foul  Smelling  4. VOMITING: "Are you also vomiting?" If so, ask: "How many times in the past 24 hours?"     No   5. ABDOMINAL PAIN: "Are you having any abdominal pain?" If yes: "What does it feel like?" (e.g., crampy, dull, intermittent, constant)       crampy    abd  Pain   6. ABDOMINAL PAIN SEVERITY: If present, ask: "How bad is the pain?"  (e.g., Scale 1-10; mild, moderate, or severe)    - MILD (1-3): doesn't interfere with normal activities, abdomen soft and not tender to touch     - MODERATE (4-7): interferes with normal activities or awakens from sleep, tender to touch     - SEVERE (8-10): excruciating pain, doubled over, unable to do any normal activities        mod 7. ORAL INTAKE: If vomiting, "Have you been able to drink liquids?" "How much fluids have you had in the past 24 hours?"taking  liquids     8. HYDRATION: "Any signs of dehydration?" (e.g., dry mouth [not just dry lips], too weak to stand, dizziness, new weight loss) "When did you last urinate?"    Appears   Week   9. EXPOSURE: "Have you traveled to a  foreign country recently?" "Have you been exposed to anyone with diarrhea?" "Could you have eaten any food that was spoiled?"   NO   10. OTHER SYMPTOMS: "Do you have any other symptoms?" (e.g., fever, blood in stool)    BACK   PAIN   SCALE  OF  8     11. PREGNANCY: "Is there any chance you are pregnant?" "When was your last menstrual period?"     NO  Protocols used: DIARRHEA-A-AH

## 2017-05-13 NOTE — Assessment & Plan Note (Signed)
Most likely a spasm associated with her significant postop changes and arthritis in her back. No suggestions of a nerve compression and no saddle anesthesia - Low dose of baclofen - Counseled on exercises and stretches

## 2017-05-13 NOTE — Patient Instructions (Signed)
Thank you for coming in,   We will call you with the results from today.   I have sent in an antibiotic  Please pick up the stool culture and return for [place] to evaluate.   I have sent in baclofen. This can make you drowsy so be careful with its use.    Please feel free to call with any questions or concerns at any time, at [phone]. --Dr. Raeford Razor

## 2017-05-13 NOTE — Telephone Encounter (Signed)
Copied from [place] #[id number]. Topic: Quick Communication - See Telephone Encounter >> May 13, 2017 12:20 PM Darl Householder, RMA wrote: CRM for notification. See Telephone encounter for:  05/13/17. Pt has been scheduled for today with Dr. Raeford Razor at 3:00pm for possible bronchitis

## 2017-05-13 NOTE — Telephone Encounter (Signed)
Spoke to [employer]. She needs a 1 time recert authorization for [place].

## 2017-05-13 NOTE — Telephone Encounter (Signed)
Copied from [place] #[id number]. Topic: Quick Communication - See Telephone Encounter >> May 13, 2017 11:09 AM Corie Chiquito, NT wrote: CRM for notification. See Telephone encounter for:  05/13/17. Patient daughter called and said that her mother is having lower back pain, diarrhea since last Thursday, no fever but is nauseated

## 2017-05-13 NOTE — Assessment & Plan Note (Signed)
Likely component of viral gastroenteritis versus bowel movement around  fecal ball. Nonbloody. Does not appear to be suggestive of a diverticulitis - 1 dose of Cipro and stool culture - X-ray - Imodium

## 2017-05-13 NOTE — Progress Notes (Signed)
Haley Harris - 83 y.o. female MRN [id number]  Date of birth: 05/09/1929  SUBJECTIVE:  Including CC & ROS.  Chief Complaint  Patient presents with  . Diarrhea    ongoing for one week. Denies blood in her stool. History diverticulitis.   . Back Pain    lower back for past two days-she has been undergoing physical therapy which started after.     Haley Harris is a 83 y.o. female that is acute on chronic low back pain and diarrhea. The back pain started about 2 days ago after she has worked with a physical therapist. The pain is localized to her lower back. It is sharp and stabbing in nature. The pain is exacerbated with walking and movement. She denies any radicular symptoms. She is not able to tolerate many medications for her pain. She walks with a rolling walker and is on oxygen. She has had significant surgeries on her back.  Having diarrhea for one week. She is having between 2-5 watery stools. Denies any fever or night sweats. Has a history of diverticulosis. Denies any blood in her stool. Has not had any improvement in her diarrhea. Having some lower quadrant abdominal pain. Has not had urinary complaints. Has not had any travel. Has not tried any new foods..  Independent review of her lumbar spine from 2013 shows a compression fracture at T12 on stable postsurgical hardware of the lumbar spine. She has had a fusion from L4-L5 and L5-S1 and a grade 1-2 anterior listhesis. Degenerative lumbar spondylosis as well.  Review of the CT abdomen pelvis from 2016 shows resolving distal colon and sigmoid obstruction and short segment of the sigmoid circumferential wall for nicking which was suspicious for underlying sigmoid mural lesion versus sigmoid stricture. Extensive sigmoid diverticulosis.  Review of the bone scan from 2017 shows known compression fracture of the thoracic spine and no metastatic disease to the bone.  Review of Systems  Constitutional: Negative for fever.  Gastrointestinal:  Positive for abdominal pain and diarrhea.  Musculoskeletal: Positive for back pain and gait problem.  Skin: Negative for color change.    HISTORY: Past Medical, Surgical, Social, and Family History Reviewed & Updated per EMR.   Pertinent Historical Findings include:  Past Medical History:  Diagnosis Date  . ACE-inhibitor cough   . Allergic rhinitis, cause unspecified   . Angina   . Anxiety   . CHF (congestive heart failure) ([place])   . Chronic airway obstruction, not elsewhere classified   . Compression fracture of T12 vertebra ([place]) 10/28/2011  . Depressive disorder, not elsewhere classified   . Esophageal reflux   . Gout   . Heart murmur    aS CHILD  . Malignant neoplasm of breast (female), unspecified site   . Myocardial infarct ([place])   . Neuromuscular disorder ([place])    NEROPATHY FROM STENOSIS  . Occlusion and stenosis of carotid artery without mention of cerebral infarction   . Osteoarthrosis, unspecified whether generalized or localized, unspecified site   . Osteoporosis, unspecified   . Other and unspecified hyperlipidemia   . Other dyspnea and respiratory abnormality   . Oxygen dependent   . Peripheral vascular disease, unspecified ([place])   . Personal history of malignant neoplasm of breast   . Pneumonia   . Recurrent upper respiratory infection (URI)   . Shortness of breath   . Spinal stenosis   . Unspecified cardiovascular disease   . Unspecified essential hypertension   . Unspecified hearing loss   . Unspecified  urinary incontinence     Past Surgical History:  Procedure Laterality Date  . ABDOMINAL HYSTERECTOMY    . APPENDECTOMY  1948  . BREAST IMPLANT REMOVAL  06/12/09   right  . BREAST RECONSTRUCTION  1998   Reconstruction   . CARDIAC CATHETERIZATION N/A 01/25/2015   Procedure: Left Heart Cath and Coronary Angiography;  Surgeon: Charolette Forward, MD;  Location: [facility];  Service: Cardiovascular;  Laterality: N/A;  . CORONARY ANGIOPLASTY  11/12    distal RCA  . FLEXIBLE SIGMOIDOSCOPY N/A 01/21/2015   Procedure: FLEXIBLE SIGMOIDOSCOPY;  Surgeon: Richmond Campbell, MD;  Location: [facility] ENDOSCOPY;  Service: Endoscopy;  Laterality: N/A;  . FLEXIBLE SIGMOIDOSCOPY  01/22/2015      . FRACTURE SURGERY     fracture right elbow  . LEFT HEART CATH AND CORONARY ANGIOGRAPHY N/A 11/14/2016   Procedure: Left Heart Cath and Coronary Angiography;  Surgeon: Charolette Forward, MD;  Location: [place] [facility];  Service: Cardiovascular;  Laterality: N/A;  . LEFT HEART CATHETERIZATION WITH CORONARY ANGIOGRAM N/A 06/05/2011   Procedure: LEFT HEART CATHETERIZATION WITH CORONARY ANGIOGRAM;  Surgeon: Clent Demark, MD;  Location: [place] [facility] CATH LAB;  Service: Cardiovascular;  Laterality: N/A;  . LEFT HEART CATHETERIZATION WITH CORONARY ANGIOGRAM N/A 03/05/2014   Procedure: LEFT HEART CATHETERIZATION WITH CORONARY ANGIOGRAM;  Surgeon: Clent Demark, MD;  Location: [facility] CATH LAB;  Service: Cardiovascular;  Laterality: N/A;  . LUMBAR LAMINECTOMY  2003  . MASTECTOMY  1987   Right  . MASTECTOMY    . MASTOIDECTOMY  childhood  . REDUCTION MAMMAPLASTY  1998   Left  . SHOULDER SURGERY  02/2005   Bilateral fractures with multiple surgeries    Allergies  Allergen Reactions  . Amoxicillin-Pot Clavulanate Anaphylaxis  . Penicillins Anaphylaxis  . Cephalexin Other (See Comments)    Reaction unknown  . Clarithromycin Other (See Comments)    Reaction unknown  . Lidocaine     Patient had tremors following use of lidocaine pain patches  . Nifedipine Other (See Comments)    Reaction unknown  . Nsaids Other (See Comments)    Heart issue  . Olmesartan Medoxomil     REACTION: cough;  But tolerating Losartan 02/2014  . Tramadol Hcl Other (See Comments)    loopy    Family History  Problem Relation Age of Onset  . Heart attack Father 60  . Cancer Mother        ovarian  . Cancer Brother        lung  . Arthritis Unknown        family     Social History   Social History  .  Marital status: Widowed    Spouse name: N/A  . Number of children: 2  . Years of education: N/A   Occupational History  . 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  [employer]   Social History Main Topics  . Smoking status: Former Smoker    Quit date: 07/13/1974  . Smokeless tobacco: Never Used  . Alcohol use 0.0 oz/week     Comment: occasional  . Drug use: No  . Sexual activity: No   Other Topics Concern  . Not on file   Social History Narrative   Has living will   Son or daughter would be health care POA.   Requests DNR--written 03/02/13   No tube feeds if cognitively unaware     PHYSICAL EXAM:  VS: BP 118/64 (BP Location: Left Arm,  Patient Position: Sitting, Cuff Size: Normal)   Pulse 94   Temp 98.2 F (36.8 C) (Oral)   Ht 5' 6.5" (1.689 m)   Wt 174 lb (78.9 kg)   SpO2 94%   BMI 27.66 kg/m  Physical Exam Gen: NAD, alert, cooperative with exam, Nasal cannula in place ENT: normal lips, normal nasal mucosa,  Eye: normal EOM, normal conjunctiva and lids CV:  no edema, +2 pedal pulses   Resp: no accessory muscle use, non-labored,  GI: no masses or tenderness, no hernia, soft, nondistended  Skin: no rashes, no areas of induration  Neuro: normal tone, normal sensation to touch Psych:  normal insight, alert and oriented MSK:  Back: Tennis palpation of the paraspinal muscles of lumbar region. Normal internal/external rotation of the hips. Normal strength hip flexion to resistance. Able to extend and flex her knees. Ambulate with a rolling walker Neurovascularly intact    ASSESSMENT & PLAN:   Diarrhea Likely component of viral gastroenteritis versus bowel movement around  fecal ball. Nonbloody. Does not appear to be suggestive of a diverticulitis - 1 dose of Cipro and stool culture - X-ray - Imodium   Acute bilateral low back pain without sciatica Most likely a spasm associated with her significant postop changes and arthritis in her back. No  suggestions of a nerve compression and no saddle anesthesia - Low dose of baclofen - Counseled on exercises and stretches

## 2017-05-14 ENCOUNTER — Telehealth: Payer: Self-pay | Admitting: Family Medicine

## 2017-05-14 ENCOUNTER — Telehealth: Payer: Self-pay | Admitting: Internal Medicine

## 2017-05-14 DIAGNOSIS — I739 Peripheral vascular disease, unspecified: Secondary | ICD-10-CM | POA: Diagnosis not present

## 2017-05-14 DIAGNOSIS — N183 Chronic kidney disease, stage 3 (moderate): Secondary | ICD-10-CM | POA: Diagnosis not present

## 2017-05-14 DIAGNOSIS — M4800 Spinal stenosis, site unspecified: Secondary | ICD-10-CM | POA: Diagnosis not present

## 2017-05-14 DIAGNOSIS — M1991 Primary osteoarthritis, unspecified site: Secondary | ICD-10-CM | POA: Diagnosis not present

## 2017-05-14 DIAGNOSIS — I2510 Atherosclerotic heart disease of native coronary artery without angina pectoris: Secondary | ICD-10-CM | POA: Diagnosis not present

## 2017-05-14 DIAGNOSIS — J449 Chronic obstructive pulmonary disease, unspecified: Secondary | ICD-10-CM | POA: Diagnosis not present

## 2017-05-14 DIAGNOSIS — M810 Age-related osteoporosis without current pathological fracture: Secondary | ICD-10-CM | POA: Diagnosis not present

## 2017-05-14 DIAGNOSIS — I5043 Acute on chronic combined systolic (congestive) and diastolic (congestive) heart failure: Secondary | ICD-10-CM | POA: Diagnosis not present

## 2017-05-14 DIAGNOSIS — R197 Diarrhea, unspecified: Secondary | ICD-10-CM

## 2017-05-14 DIAGNOSIS — I130 Hypertensive heart and chronic kidney disease with heart failure and stage 1 through stage 4 chronic kidney disease, or unspecified chronic kidney disease: Secondary | ICD-10-CM | POA: Diagnosis not present

## 2017-05-14 DIAGNOSIS — J9621 Acute and chronic respiratory failure with hypoxia: Secondary | ICD-10-CM | POA: Diagnosis not present

## 2017-05-14 DIAGNOSIS — M109 Gout, unspecified: Secondary | ICD-10-CM | POA: Diagnosis not present

## 2017-05-14 NOTE — Telephone Encounter (Signed)
Left VM for patient. If she calls back please have her speak with a nurse/CMA and inform that her xray shows an ileus. Advise bland foods and push fluids. If she is unable to tolerate anything by mouth, ends up stop having bowel movements or has excessive abdominal pain then would need to seek immediate care in the ED.   If any questions then please take the best time and phone number to call and I will try to call her back.   Rosemarie Ax, MD [facility] 05/14/2017, 8:38 AM

## 2017-05-14 NOTE — Telephone Encounter (Signed)
Copied from [place] [id number]. Topic: Quick Communication - See Telephone Encounter >> May 14, 2017  3:37 PM Ether Griffins B wrote: CRM for notification. See Telephone encounter for:  05/14/17. Erin with [facility] needing a verbal order for 2x a week for 8 weeks for strength and balance. Call back # [phone]

## 2017-05-15 NOTE — Telephone Encounter (Signed)
That is okay.

## 2017-05-17 ENCOUNTER — Telehealth: Payer: Self-pay | Admitting: Internal Medicine

## 2017-05-17 DIAGNOSIS — R197 Diarrhea, unspecified: Secondary | ICD-10-CM | POA: Diagnosis not present

## 2017-05-17 NOTE — Telephone Encounter (Signed)
That is fine 

## 2017-05-17 NOTE — Telephone Encounter (Addendum)
I spoke to Haley Harris 05-13-17 and gave a 1 time recert authorization . Left detailed message for [facility]

## 2017-05-17 NOTE — Telephone Encounter (Signed)
Left detailed message on vm with orders

## 2017-05-17 NOTE — Telephone Encounter (Signed)
Copied from [place] #[id number]. Topic: Quick Communication - See Telephone Encounter >> May 17, 2017  3:55 PM Ether Griffins B wrote: CRM for notification. See Telephone encounter for:  [facility] wanting an order for a nurse to go out tomorrow to do a re certification visit. Call back number is [phone]  05/17/17.

## 2017-05-18 ENCOUNTER — Telehealth: Payer: Self-pay | Admitting: Internal Medicine

## 2017-05-18 DIAGNOSIS — I5043 Acute on chronic combined systolic (congestive) and diastolic (congestive) heart failure: Secondary | ICD-10-CM | POA: Diagnosis not present

## 2017-05-18 DIAGNOSIS — I2510 Atherosclerotic heart disease of native coronary artery without angina pectoris: Secondary | ICD-10-CM | POA: Diagnosis not present

## 2017-05-18 DIAGNOSIS — M4800 Spinal stenosis, site unspecified: Secondary | ICD-10-CM | POA: Diagnosis not present

## 2017-05-18 DIAGNOSIS — M109 Gout, unspecified: Secondary | ICD-10-CM | POA: Diagnosis not present

## 2017-05-18 DIAGNOSIS — J449 Chronic obstructive pulmonary disease, unspecified: Secondary | ICD-10-CM | POA: Diagnosis not present

## 2017-05-18 DIAGNOSIS — J9621 Acute and chronic respiratory failure with hypoxia: Secondary | ICD-10-CM | POA: Diagnosis not present

## 2017-05-18 DIAGNOSIS — N183 Chronic kidney disease, stage 3 (moderate): Secondary | ICD-10-CM | POA: Diagnosis not present

## 2017-05-18 DIAGNOSIS — I130 Hypertensive heart and chronic kidney disease with heart failure and stage 1 through stage 4 chronic kidney disease, or unspecified chronic kidney disease: Secondary | ICD-10-CM | POA: Diagnosis not present

## 2017-05-18 DIAGNOSIS — M810 Age-related osteoporosis without current pathological fracture: Secondary | ICD-10-CM | POA: Diagnosis not present

## 2017-05-18 DIAGNOSIS — M1991 Primary osteoarthritis, unspecified site: Secondary | ICD-10-CM | POA: Diagnosis not present

## 2017-05-18 DIAGNOSIS — I739 Peripheral vascular disease, unspecified: Secondary | ICD-10-CM | POA: Diagnosis not present

## 2017-05-18 NOTE — Telephone Encounter (Signed)
Copied from [place] #[id number]. Topic: Quick Communication - See Telephone Encounter >> May 18, 2017  4:15 PM Boyd Kerbs wrote: CRM for notification. See Telephone encounter for:  Scarlett from [facility] calling to get new Order to go back to get recertification.  Nurse that went last time had back injury and did not make it.  Need new order. Please call Scarlett   05/18/17.

## 2017-05-18 NOTE — Telephone Encounter (Signed)
No phone number was left for Haley Harris. Looked back in previous message to get her number. Left a message for her with recert order.

## 2017-05-18 NOTE — Telephone Encounter (Signed)
Okay to make visit for recertification

## 2017-05-21 DIAGNOSIS — I509 Heart failure, unspecified: Secondary | ICD-10-CM | POA: Diagnosis not present

## 2017-05-21 DIAGNOSIS — G8929 Other chronic pain: Secondary | ICD-10-CM | POA: Diagnosis not present

## 2017-05-21 DIAGNOSIS — M1991 Primary osteoarthritis, unspecified site: Secondary | ICD-10-CM | POA: Diagnosis not present

## 2017-05-21 DIAGNOSIS — Z7982 Long term (current) use of aspirin: Secondary | ICD-10-CM | POA: Diagnosis not present

## 2017-05-21 DIAGNOSIS — Z9181 History of falling: Secondary | ICD-10-CM | POA: Diagnosis not present

## 2017-05-21 DIAGNOSIS — G9009 Other idiopathic peripheral autonomic neuropathy: Secondary | ICD-10-CM | POA: Diagnosis not present

## 2017-05-21 LAB — STOOL CULTURE
MICRO NUMBER: 81239904
MICRO NUMBER:: 81239902
MICRO NUMBER:: 81239903
SHIGA RESULT: NOT DETECTED
SPECIMEN QUALITY: ADEQUATE
SPECIMEN QUALITY: ADEQUATE
SPECIMEN QUALITY:: ADEQUATE

## 2017-05-24 ENCOUNTER — Telehealth: Payer: Self-pay | Admitting: *Deleted

## 2017-05-24 NOTE — Telephone Encounter (Signed)
That is fine 

## 2017-05-24 NOTE — Telephone Encounter (Signed)
Copied from [place]. Topic: Inquiry >> May 24, 2017  9:59 AM Bea Graff, NT wrote: Reason for CRM: Gracie from [facility] physical therapy is needing a verbal order for this pt to have physical therapy for 1 times a week for 1 week and 2 times per week for 6 weeks. CB#: [phone]

## 2017-05-24 NOTE — Telephone Encounter (Signed)
Verbal order given to Gracie  

## 2017-05-25 DIAGNOSIS — I509 Heart failure, unspecified: Secondary | ICD-10-CM | POA: Diagnosis not present

## 2017-05-25 DIAGNOSIS — I5020 Unspecified systolic (congestive) heart failure: Secondary | ICD-10-CM | POA: Diagnosis not present

## 2017-05-25 DIAGNOSIS — M1991 Primary osteoarthritis, unspecified site: Secondary | ICD-10-CM | POA: Diagnosis not present

## 2017-05-25 DIAGNOSIS — Z9181 History of falling: Secondary | ICD-10-CM | POA: Diagnosis not present

## 2017-05-25 DIAGNOSIS — G8929 Other chronic pain: Secondary | ICD-10-CM | POA: Diagnosis not present

## 2017-05-25 DIAGNOSIS — G9009 Other idiopathic peripheral autonomic neuropathy: Secondary | ICD-10-CM | POA: Diagnosis not present

## 2017-05-25 DIAGNOSIS — Z7982 Long term (current) use of aspirin: Secondary | ICD-10-CM | POA: Diagnosis not present

## 2017-05-27 ENCOUNTER — Encounter: Payer: Self-pay | Admitting: Internal Medicine

## 2017-05-27 ENCOUNTER — Ambulatory Visit: Payer: Medicare Other | Admitting: Internal Medicine

## 2017-05-27 VITALS — BP 110/70 | HR 84 | Temp 98.1°F | Wt 179.0 lb

## 2017-05-27 DIAGNOSIS — R197 Diarrhea, unspecified: Secondary | ICD-10-CM

## 2017-05-27 DIAGNOSIS — M545 Low back pain, unspecified: Secondary | ICD-10-CM

## 2017-05-27 DIAGNOSIS — G9009 Other idiopathic peripheral autonomic neuropathy: Secondary | ICD-10-CM | POA: Diagnosis not present

## 2017-05-27 DIAGNOSIS — Z7982 Long term (current) use of aspirin: Secondary | ICD-10-CM | POA: Diagnosis not present

## 2017-05-27 DIAGNOSIS — Z9181 History of falling: Secondary | ICD-10-CM | POA: Diagnosis not present

## 2017-05-27 DIAGNOSIS — M1991 Primary osteoarthritis, unspecified site: Secondary | ICD-10-CM | POA: Diagnosis not present

## 2017-05-27 DIAGNOSIS — I509 Heart failure, unspecified: Secondary | ICD-10-CM | POA: Diagnosis not present

## 2017-05-27 DIAGNOSIS — G8929 Other chronic pain: Secondary | ICD-10-CM | POA: Diagnosis not present

## 2017-05-27 NOTE — Assessment & Plan Note (Signed)
Some loose stools but sporadic now Cultures negative Discussed probiotic and observation

## 2017-05-27 NOTE — Progress Notes (Signed)
Subjective:    Patient ID: Haley Harris, female    DOB: 04-20-1929, 83 y.o.   MRN: [id number]  HPI Here with daughter with some pain and  loose stools  Biggest problem is bad pain in her back--- 2.5 weeks ago Can't lie in bed--- has to sleep in recliner Started with the stomach/intestinal problems Did work hard at PT the day before this all started Pain is across both sides in lumbar area Constant-- but worsens with movement and trying to get up No radiation up back but thighs are sore (?from overusing) Taking tylenol 1300 mg give slight relief Baclofen caused some fluid retention--she stopped Heat and cold--- ??very brief relief  Stools not back to normal Goes 1-2 times a day now---loose Then will skip a day  Not as bad as before---no blood either Appetite is not good--but eating when daughter gives it No abdominal pain--just occasional cramps  Current Outpatient Medications on File Prior to Visit  Medication Sig Dispense Refill  . acetaminophen (TYLENOL) 500 MG tablet Take 1,000 mg by mouth every 8 (eight) hours as needed for mild pain.     Marland Kitchen albuterol (PROVENTIL) (2.5 MG/3ML) 0.083% nebulizer solution Take 3 mLs (2.5 mg total) by nebulization every 6 (six) hours as needed for wheezing or shortness of breath. 540 mL 3  . ALPRAZolam (XANAX) 0.25 MG tablet Take 0.5-1 tablets (0.125-0.25 mg total) by mouth 2 (two) times daily as needed for anxiety. 20 tablet 0  . aspirin 81 MG tablet Take 81 mg by mouth every evening.     Marland Kitchen atorvastatin (LIPITOR) 40 MG tablet Take 1 tablet (40 mg total) by mouth daily at 6 PM. 90 tablet 1  . Calcium Carbonate-Vitamin D (CALTRATE 600+D PO) Take 1 tablet by mouth daily after lunch.     . carvedilol (COREG) 6.25 MG tablet Take 1 tablet (6.25 mg total) by mouth 2 (two) times daily with a meal. Take 1 tablet by mouth two  times daily with a meal 60 tablet 3  . escitalopram (LEXAPRO) 10 MG tablet TAKE 1 TABLET BY MOUTH  DAILY 90 tablet 3  . famotidine  (PEPCID) 20 MG tablet TAKE 1 TABLET BY MOUTH TWO  TIMES DAILY 180 tablet 3  . fluticasone (FLONASE) 50 MCG/ACT nasal spray Use 2 sprays in each  nostril daily 48 g 3  . furosemide (LASIX) 40 MG tablet Take 1 tablet (40 mg total) by mouth daily. Take 1 pill twice daily if weight has elevated. 135 tablet 3  . gabapentin (NEURONTIN) 100 MG capsule TAKE 1 CAPSULE BY MOUTH TWO TIMES DAILY 180 capsule 3  . losartan (COZAAR) 50 MG tablet TAKE 1 TABLET BY MOUTH  DAILY 90 tablet 3  . Melatonin 3 MG TABS Take 1 tablet by mouth at bedtime.     . montelukast (SINGULAIR) 10 MG tablet TAKE 1 TABLET BY MOUTH  DAILY 90 tablet 3  . Morphine Sulfate (MORPHINE CONCENTRATE) 10 mg / 0.5 ml concentrated solution Place 0.25-0.5 mLs (5-10 mg total) under the tongue every 2 (two) hours as needed for severe pain or shortness of breath. 30 mL 0  . Multiple Vitamin (MULITIVITAMIN WITH MINERALS) TABS Take 1 tablet by mouth daily after lunch.     . nitroGLYCERIN (NITROSTAT) 0.4 MG SL tablet Place 1 tablet (0.4 mg total) under the tongue every 5 (five) minutes x 3 doses as needed for chest pain. 25 tablet 2  . PROAIR HFA 108 (90 Base) MCG/ACT inhaler USE 2 PUFFS  EVERY 6 HOURS AS NEEDED FOR WHEEZING OR SHORTNESS OF BREATH 8.5 g 5  . spironolactone (ALDACTONE) 25 MG tablet TAKE 1 TABLET BY MOUTH  DAILY 90 tablet 3  . vitamin B-12 (CYANOCOBALAMIN) 500 MCG tablet Take 1,000 mcg by mouth 3 (three) times a week. Mon, Wed, Fri     No current facility-administered medications on file prior to visit.     Allergies  Allergen Reactions  . Amoxicillin-Pot Clavulanate Anaphylaxis  . Penicillins Anaphylaxis  . Cephalexin Other (See Comments)    Reaction unknown  . Clarithromycin Other (See Comments)    Reaction unknown  . Lidocaine     Patient had tremors following use of lidocaine pain patches  . Nifedipine Other (See Comments)    Reaction unknown  . Nsaids Other (See Comments)    Heart issue  . Olmesartan Medoxomil      REACTION: cough;  But tolerating Losartan 02/2014  . Tramadol Hcl Other (See Comments)    loopy    Past Medical History:  Diagnosis Date  . ACE-inhibitor cough   . Allergic rhinitis, cause unspecified   . Angina   . Anxiety   . CHF (congestive heart failure) ([place])   . Chronic airway obstruction, not elsewhere classified   . Compression fracture of T12 vertebra ([place]) 10/28/2011  . Depressive disorder, not elsewhere classified   . Esophageal reflux   . Gout   . Heart murmur    aS CHILD  . Malignant neoplasm of breast (female), unspecified site   . Myocardial infarct ([place])   . Neuromuscular disorder ([place])    NEROPATHY FROM STENOSIS  . Occlusion and stenosis of carotid artery without mention of cerebral infarction   . Osteoarthrosis, unspecified whether generalized or localized, unspecified site   . Osteoporosis, unspecified   . Other and unspecified hyperlipidemia   . Other dyspnea and respiratory abnormality   . Oxygen dependent   . Peripheral vascular disease, unspecified ([place])   . Personal history of malignant neoplasm of breast   . Pneumonia   . Recurrent upper respiratory infection (URI)   . Shortness of breath   . Spinal stenosis   . Unspecified cardiovascular disease   . Unspecified essential hypertension   . Unspecified hearing loss   . Unspecified urinary incontinence     Past Surgical History:  Procedure Laterality Date  . ABDOMINAL HYSTERECTOMY    . APPENDECTOMY  1948  . BREAST IMPLANT REMOVAL  06/12/09   right  . BREAST RECONSTRUCTION  1998   Reconstruction   . CARDIAC CATHETERIZATION N/A 01/25/2015   Procedure: Left Heart Cath and Coronary Angiography;  Surgeon: Charolette Forward, MD;  Location: [facility];  Service: Cardiovascular;  Laterality: N/A;  . CORONARY ANGIOPLASTY  11/12   distal RCA  . FLEXIBLE SIGMOIDOSCOPY N/A 01/21/2015   Procedure: FLEXIBLE SIGMOIDOSCOPY;  Surgeon: Richmond Campbell, MD;  Location: [facility] ENDOSCOPY;  Service: Endoscopy;   Laterality: N/A;  . FLEXIBLE SIGMOIDOSCOPY  01/22/2015      . FRACTURE SURGERY     fracture right elbow  . LEFT HEART CATH AND CORONARY ANGIOGRAPHY N/A 11/14/2016   Procedure: Left Heart Cath and Coronary Angiography;  Surgeon: Charolette Forward, MD;  Location: [place] [facility];  Service: Cardiovascular;  Laterality: N/A;  . LEFT HEART CATHETERIZATION WITH CORONARY ANGIOGRAM N/A 06/05/2011   Procedure: LEFT HEART CATHETERIZATION WITH CORONARY ANGIOGRAM;  Surgeon: Clent Demark, MD;  Location: [place] CATH LAB;  Service: Cardiovascular;  Laterality: N/A;  . LEFT HEART CATHETERIZATION  WITH CORONARY ANGIOGRAM N/A 03/05/2014   Procedure: LEFT HEART CATHETERIZATION WITH CORONARY ANGIOGRAM;  Surgeon: Clent Demark, MD;  Location[facility] C[facility] LAB;  Service: Cardiovascular;  Laterality: N/A;  . LUMBAR LAMINECTOMY  2003  . MASTECTOMY  1987   Right  . MASTECTOMY    . MASTOIDECTOMY  childhood  . REDUCTION MAMMAPLASTY  1998   Left  . SHOULDER SURGERY  02/2005   Bilateral fractures with multiple surgeries    Family History  Problem Relation Age of Onset  . Heart attack Father 40  . Cancer Mother        ovarian  . Cancer Brother        lung  . Arthritis Unknown        family    Social History   Socioeconomic History  . Marital status: Widowed    Spouse name: Not on file  . Number of children: 2  . Years of education: Not on file  . Highest education level: Not on file  Social Needs  . Financial resource strain: Not on file  . Food insecurity - worry: Not on file  . Food insecurity - inability: Not on file  . Transportation needs - medical: Not on file  . Transportation needs - non-medical: Not on file  Occupational History  . 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 Smoking status: Former Smoker    Last attempt to quit: 07/13/1974    Years since quitting: 42.9  . Smokeless tobacco: Never Used  Substance and Sexual  Activity  . Alcohol use: Yes    Alcohol/week: 0.0 oz    Comment: occasional  . Drug use: No  . Sexual activity: No    Birth control/protection: Post-menopausal  Other Topics Concern  . Not on file  Social History Narrative   Has living will   Son or daughter would be health care POA.   Requests DNR--written 03/02/13   No tube feeds if cognitively unaware   Review of Systems Aides coming for shower Had great time at the beach    Objective:   Physical Exam  Constitutional: No distress.  Sig pain and limitation in movement Barely got on table  Abdominal: Soft. Bowel sounds are normal. She exhibits no distension. There is no tenderness. There is no rebound and no guarding.  Musculoskeletal:  Tenderness along lateral lower lumbar areas. No spine tenderness Fairly normal ROM of hips   Neurological:  No focal weakness          Assessment & Plan:

## 2017-05-27 NOTE — Assessment & Plan Note (Signed)
Very severe but seems to be muscular No clear way it would be connected to her diarrhea She will continue heat/ice/tylenol Has some MSO4 for emergency use--in case severe Has ortho appt in 2 weeks Consider imaging if persists

## 2017-05-28 DIAGNOSIS — G8929 Other chronic pain: Secondary | ICD-10-CM | POA: Diagnosis not present

## 2017-05-28 DIAGNOSIS — M1991 Primary osteoarthritis, unspecified site: Secondary | ICD-10-CM | POA: Diagnosis not present

## 2017-05-28 DIAGNOSIS — G9009 Other idiopathic peripheral autonomic neuropathy: Secondary | ICD-10-CM | POA: Diagnosis not present

## 2017-05-28 DIAGNOSIS — I509 Heart failure, unspecified: Secondary | ICD-10-CM | POA: Diagnosis not present

## 2017-05-28 DIAGNOSIS — Z9181 History of falling: Secondary | ICD-10-CM | POA: Diagnosis not present

## 2017-05-28 DIAGNOSIS — Z7982 Long term (current) use of aspirin: Secondary | ICD-10-CM | POA: Diagnosis not present

## 2017-05-30 DIAGNOSIS — M1991 Primary osteoarthritis, unspecified site: Secondary | ICD-10-CM | POA: Diagnosis not present

## 2017-05-30 DIAGNOSIS — Z9181 History of falling: Secondary | ICD-10-CM | POA: Diagnosis not present

## 2017-05-30 DIAGNOSIS — I509 Heart failure, unspecified: Secondary | ICD-10-CM | POA: Diagnosis not present

## 2017-05-30 DIAGNOSIS — Z7982 Long term (current) use of aspirin: Secondary | ICD-10-CM | POA: Diagnosis not present

## 2017-05-30 DIAGNOSIS — G8929 Other chronic pain: Secondary | ICD-10-CM | POA: Diagnosis not present

## 2017-05-30 DIAGNOSIS — G9009 Other idiopathic peripheral autonomic neuropathy: Secondary | ICD-10-CM | POA: Diagnosis not present

## 2017-06-01 DIAGNOSIS — G9009 Other idiopathic peripheral autonomic neuropathy: Secondary | ICD-10-CM | POA: Diagnosis not present

## 2017-06-01 DIAGNOSIS — G8929 Other chronic pain: Secondary | ICD-10-CM | POA: Diagnosis not present

## 2017-06-01 DIAGNOSIS — I509 Heart failure, unspecified: Secondary | ICD-10-CM | POA: Diagnosis not present

## 2017-06-01 DIAGNOSIS — Z7982 Long term (current) use of aspirin: Secondary | ICD-10-CM | POA: Diagnosis not present

## 2017-06-01 DIAGNOSIS — M1991 Primary osteoarthritis, unspecified site: Secondary | ICD-10-CM | POA: Diagnosis not present

## 2017-06-01 DIAGNOSIS — Z9181 History of falling: Secondary | ICD-10-CM | POA: Diagnosis not present

## 2017-06-04 DIAGNOSIS — G8929 Other chronic pain: Secondary | ICD-10-CM | POA: Diagnosis not present

## 2017-06-04 DIAGNOSIS — Z7982 Long term (current) use of aspirin: Secondary | ICD-10-CM | POA: Diagnosis not present

## 2017-06-04 DIAGNOSIS — M1991 Primary osteoarthritis, unspecified site: Secondary | ICD-10-CM | POA: Diagnosis not present

## 2017-06-04 DIAGNOSIS — Z9181 History of falling: Secondary | ICD-10-CM | POA: Diagnosis not present

## 2017-06-04 DIAGNOSIS — I509 Heart failure, unspecified: Secondary | ICD-10-CM | POA: Diagnosis not present

## 2017-06-04 DIAGNOSIS — G9009 Other idiopathic peripheral autonomic neuropathy: Secondary | ICD-10-CM | POA: Diagnosis not present

## 2017-06-07 DIAGNOSIS — G9009 Other idiopathic peripheral autonomic neuropathy: Secondary | ICD-10-CM | POA: Diagnosis not present

## 2017-06-07 DIAGNOSIS — G8929 Other chronic pain: Secondary | ICD-10-CM | POA: Diagnosis not present

## 2017-06-07 DIAGNOSIS — Z7982 Long term (current) use of aspirin: Secondary | ICD-10-CM | POA: Diagnosis not present

## 2017-06-07 DIAGNOSIS — Z9181 History of falling: Secondary | ICD-10-CM | POA: Diagnosis not present

## 2017-06-07 DIAGNOSIS — I509 Heart failure, unspecified: Secondary | ICD-10-CM | POA: Diagnosis not present

## 2017-06-07 DIAGNOSIS — M1991 Primary osteoarthritis, unspecified site: Secondary | ICD-10-CM | POA: Diagnosis not present

## 2017-06-08 DIAGNOSIS — Z9181 History of falling: Secondary | ICD-10-CM | POA: Diagnosis not present

## 2017-06-08 DIAGNOSIS — M1991 Primary osteoarthritis, unspecified site: Secondary | ICD-10-CM | POA: Diagnosis not present

## 2017-06-08 DIAGNOSIS — G9009 Other idiopathic peripheral autonomic neuropathy: Secondary | ICD-10-CM | POA: Diagnosis not present

## 2017-06-08 DIAGNOSIS — G8929 Other chronic pain: Secondary | ICD-10-CM | POA: Diagnosis not present

## 2017-06-08 DIAGNOSIS — Z7982 Long term (current) use of aspirin: Secondary | ICD-10-CM | POA: Diagnosis not present

## 2017-06-08 DIAGNOSIS — I509 Heart failure, unspecified: Secondary | ICD-10-CM | POA: Diagnosis not present

## 2017-06-10 ENCOUNTER — Ambulatory Visit (INDEPENDENT_AMBULATORY_CARE_PROVIDER_SITE_OTHER): Payer: Medicare Other

## 2017-06-10 ENCOUNTER — Ambulatory Visit (INDEPENDENT_AMBULATORY_CARE_PROVIDER_SITE_OTHER): Payer: Medicare Other | Admitting: Surgery

## 2017-06-10 ENCOUNTER — Encounter (INDEPENDENT_AMBULATORY_CARE_PROVIDER_SITE_OTHER): Payer: Self-pay | Admitting: Surgery

## 2017-06-10 DIAGNOSIS — M533 Sacrococcygeal disorders, not elsewhere classified: Secondary | ICD-10-CM

## 2017-06-10 DIAGNOSIS — G8929 Other chronic pain: Secondary | ICD-10-CM

## 2017-06-10 DIAGNOSIS — M1991 Primary osteoarthritis, unspecified site: Secondary | ICD-10-CM | POA: Diagnosis not present

## 2017-06-10 DIAGNOSIS — M545 Low back pain, unspecified: Secondary | ICD-10-CM

## 2017-06-10 DIAGNOSIS — Z981 Arthrodesis status: Secondary | ICD-10-CM | POA: Diagnosis not present

## 2017-06-10 DIAGNOSIS — Z7982 Long term (current) use of aspirin: Secondary | ICD-10-CM | POA: Diagnosis not present

## 2017-06-10 DIAGNOSIS — Z9181 History of falling: Secondary | ICD-10-CM | POA: Diagnosis not present

## 2017-06-10 DIAGNOSIS — I509 Heart failure, unspecified: Secondary | ICD-10-CM | POA: Diagnosis not present

## 2017-06-10 DIAGNOSIS — G9009 Other idiopathic peripheral autonomic neuropathy: Secondary | ICD-10-CM | POA: Diagnosis not present

## 2017-06-10 MED ORDER — HYDROCODONE-ACETAMINOPHEN 5-325 MG PO TABS: 1.0000 | ORAL_TABLET | Freq: Two times a day (BID) | ORAL | 0 refills | Status: DC | PRN

## 2017-06-10 NOTE — Progress Notes (Signed)
Office Visit Note   Patient: Haley Harris           Date of Birth: [birth year]           MRN: [id number] Visit Date: 06/10/2017              Requested by: Venia Carbon, MD [place], [place] [id number] PCP: Venia Carbon, MD   Assessment & Plan: Visit Diagnoses:  1. Low back pain without sciatica, unspecified back pain laterality, unspecified chronicity   2. Chronic left SI joint pain   3. Chronic right SI joint pain   4. S/P lumbar fusion     Plan: With patient's current pain as scheduled her for diagnostic/therapeutic bilateral SI joint injections with Dr. Ernestina Patches. Patient will follow up in the clinic 1-2 weeks after she's had these to check her response. Given prescription for Norco. Advised patient and daughter very hesitant to give narcotic medication due to medical history. They both understand risks involved and daughter will keep an eye on patient. All questions answered.  Follow-Up Instructions: No Follow-up on file.   Orders:  Orders Placed This Encounter  Procedures  . XR Lumbar Spine 2-3 Views  . Ambulatory referral to Physical Medicine Rehab   No orders of the defined types were placed in this encounter.     Procedures: No procedures performed   Clinical Data: No additional findings.   Subjective: No chief complaint on file.   HPI Patient comes in today with complaints of low back pain. She is status post L4-S1 instrument effusion by Dr. Louanne Skye 10+ years ago.  I saw patient over a year ago and she states that her back and been doing well up until October 2018. States that she had been working with physical therapy for other issues and thinks that this may have flared up her back. Pain first started around the bilateral SI joints. Has progressed to having having spasms in the thoracolumbar area. Not complaining of any radiculopathy. Patient had left hip diagnostic/therapeutic intra-articular injection with Dr. Ernestina Patches September 2018  and this continues to do well. Patient denies any specific injury. Review of Systems Patient is on O2 nasal cannula.  Objective: Vital Signs: There were no vitals taken for this visit.  Physical Exam  Constitutional: She is oriented to person, place, and time. No distress.  HENT:  Head: Normocephalic and atraumatic.  Eyes: EOM are normal. Pupils are equal, round, and reactive to light.  Musculoskeletal:  Mild to moderate thoracolumbar paraspinal tenderness. Moderate to markedly tender over the bilateral SI joints.  Neurological: She is alert and oriented to person, place, and time.  Skin: Skin is warm and dry.  Psychiatric: She has a normal mood and affect.    Ortho Exam  Specialty Comments:  No specialty comments available.  Imaging: No results found.   PMFS History: Patient Active Problem List   Diagnosis Date Noted  . Diarrhea 05/13/2017  . Low back pain 05/13/2017  . Acute on chronic respiratory failure with hypoxia ([place]) 03/13/2017  . Chronic hypoxemic respiratory failure ([place]) 03/09/2017  . DOE (dyspnea on exertion) 01/21/2017  . Gout   . Pain of finger of left hand 12/15/2016  . Benign hypertensive heart disease with CHF and with combined systolic and diastolic dysfunction, NYHA class 3 ([place]) 12/06/2016  . Osteoarthritis of left hip 09/28/2016  . Cervical spondylosis without myelopathy 07/31/2016  . Primary osteoarthritis of both first carpometacarpal joints 07/31/2016  . Acute on  chronic combined systolic and diastolic heart failure ([place]) 07/22/2016  . Spinal stenosis in cervical region 05/29/2016  . Venous stasis dermatitis 11/13/2015  . Essential hypertension 10/30/2015  . Diverticulitis of colon 02/18/2015  . Hyponatremia   . Chronic combined systolic and diastolic CHF (congestive heart failure) ([place])   . Chronic kidney disease, stage III (moderate) (H[facility])   . PVD (peripheral vascular disease) ([place])   . Right adrenal mass ([place]) 01/18/2015  . Chronic  venous insufficiency 12/18/2014  . Advanced directives, counseling/discussion 04/02/2014  . Neuropathy due to peripheral vascular disease ([place]) 03/11/2014  . Routine general medical examination at a health care facility 03/02/2013  . Breast cancer ([place]) 12/02/2011  . Constipation due to pain medication 10/28/2011  . T12 compression fracture ([place]) 10/28/2011  . Atherosclerotic heart disease of native coronary artery with angina pectoris ([place]) 06/05/2011  . Dyslipidemia 10/30/2006  . Episodic mood disorder ([place]) 10/30/2006  . COPD (chronic obstructive pulmonary disease) ([place]) 10/30/2006  . GERD 10/30/2006  . Osteoarthritis 10/30/2006  . OP (osteoporosis) 10/30/2006  . URINARY INCONTINENCE 10/30/2006   Past Medical History:  Diagnosis Date  . ACE-inhibitor cough   . Allergic rhinitis, cause unspecified   . Angina   . Anxiety   . CHF (congestive heart failure) ([place])   . Chronic airway obstruction, not elsewhere classified   . Compression fracture of T12 vertebra ([place]) 10/28/2011  . Depressive disorder, not elsewhere classified   . Esophageal reflux   . Gout   . Heart murmur    aS CHILD  . Malignant neoplasm of breast (female), unspecified site   . Myocardial infarct ([place])   . Neuromuscular disorder ([place])    NEROPATHY FROM STENOSIS  . Occlusion and stenosis of carotid artery without mention of cerebral infarction   . Osteoarthrosis, unspecified whether generalized or localized, unspecified site   . Osteoporosis, unspecified   . Other and unspecified hyperlipidemia   . Other dyspnea and respiratory abnormality   . Oxygen dependent   . Peripheral vascular disease, unspecified ([place])   . Personal history of malignant neoplasm of breast   . Pneumonia   . Recurrent upper respiratory infection (URI)   . Shortness of breath   . Spinal stenosis   . Unspecified cardiovascular disease   . Unspecified essential hypertension   . Unspecified hearing loss   . Unspecified urinary  incontinence     Family History  Problem Relation Age of Onset  . Heart attack Father 62  . Cancer Mother        ovarian  . Cancer Brother        lung  . Arthritis Unknown        family    Past Surgical History:  Procedure Laterality Date  . ABDOMINAL HYSTERECTOMY    . APPENDECTOMY  1948  . BREAST IMPLANT REMOVAL  06/12/09   right  . BREAST RECONSTRUCTION  1998   Reconstruction   . CARDIAC CATHETERIZATION N/A 01/25/2015   Procedure: Left Heart Cath and Coronary Angiography;  Surgeon: Charolette Forward, MD;  Location: [place];  Service: Cardiovascular;  Laterality: N/A;  . CORONARY ANGIOPLASTY  11/12   distal RCA  . FLEXIBLE SIGMOIDOSCOPY N/A 01/21/2015   Procedure: FLEXIBLE SIGMOIDOSCOPY;  Surgeon: Richmond Campbell, MD;  Location: [facility] ENDOSCOPY;  Service: Endoscopy;  Laterality: N/A;  . FLEXIBLE SIGMOIDOSCOPY  01/22/2015      . FRACTURE SURGERY     fracture right elbow  . LEFT HEART CATH AND CORONARY ANGIOGRAPHY N/A 11/14/2016  Procedure: Left Heart Cath and Coronary Angiography;  Surgeon: Charolette Forward, MD;  Location: [facility];  Service: Cardiovascular;  Laterality: N/A;  . LEFT HEART CATHETERIZATION WITH CORONARY ANGIOGRAM N/A 06/05/2011   Procedure: LEFT HEART CATHETERIZATION WITH CORONARY ANGIOGRAM;  Surgeon: Clent Demark, MD;  Location: [facility] CATH LAB;  Service: Cardiovascular;  Laterality: N/A;  . LEFT HEART CATHETERIZATION WITH CORONARY ANGIOGRAM N/A 03/05/2014   Procedure: LEFT HEART CATHETERIZATION WITH CORONARY ANGIOGRAM;  Surgeon: Clent Demark, MD;  Location: [facility] CATH LAB;  Service: Cardiovascular;  Laterality: N/A;  . LUMBAR LAMINECTOMY  2003  . MASTECTOMY  1987   Right  . MASTECTOMY    . MASTOIDECTOMY  childhood  . REDUCTION MAMMAPLASTY  1998   Left  . SHOULDER SURGERY  02/2005   Bilateral fractures with multiple surgeries   Social History   Occupational History  . 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 Smoking status: Former Smoker    Last attempt to quit: 07/13/1974    Years since quitting: 42.9  . Smokeless tobacco: Never Used  Substance and Sexual Activity  . Alcohol use: Yes    Alcohol/week: 0.0 oz    Comment: occasional  . Drug use: No  . Sexual activity: No    Birth control/protection: Post-menopausal

## 2017-06-11 DIAGNOSIS — M1991 Primary osteoarthritis, unspecified site: Secondary | ICD-10-CM | POA: Diagnosis not present

## 2017-06-11 DIAGNOSIS — Z9181 History of falling: Secondary | ICD-10-CM | POA: Diagnosis not present

## 2017-06-11 DIAGNOSIS — G9009 Other idiopathic peripheral autonomic neuropathy: Secondary | ICD-10-CM | POA: Diagnosis not present

## 2017-06-11 DIAGNOSIS — G8929 Other chronic pain: Secondary | ICD-10-CM | POA: Diagnosis not present

## 2017-06-11 DIAGNOSIS — I509 Heart failure, unspecified: Secondary | ICD-10-CM | POA: Diagnosis not present

## 2017-06-11 DIAGNOSIS — Z7982 Long term (current) use of aspirin: Secondary | ICD-10-CM | POA: Diagnosis not present

## 2017-06-15 DIAGNOSIS — M1991 Primary osteoarthritis, unspecified site: Secondary | ICD-10-CM | POA: Diagnosis not present

## 2017-06-15 DIAGNOSIS — Z7982 Long term (current) use of aspirin: Secondary | ICD-10-CM | POA: Diagnosis not present

## 2017-06-15 DIAGNOSIS — I509 Heart failure, unspecified: Secondary | ICD-10-CM | POA: Diagnosis not present

## 2017-06-15 DIAGNOSIS — G9009 Other idiopathic peripheral autonomic neuropathy: Secondary | ICD-10-CM | POA: Diagnosis not present

## 2017-06-15 DIAGNOSIS — G8929 Other chronic pain: Secondary | ICD-10-CM | POA: Diagnosis not present

## 2017-06-15 DIAGNOSIS — Z9181 History of falling: Secondary | ICD-10-CM | POA: Diagnosis not present

## 2017-06-17 ENCOUNTER — Ambulatory Visit (INDEPENDENT_AMBULATORY_CARE_PROVIDER_SITE_OTHER): Payer: Medicare Other | Admitting: Physical Medicine and Rehabilitation

## 2017-06-17 ENCOUNTER — Encounter (INDEPENDENT_AMBULATORY_CARE_PROVIDER_SITE_OTHER): Payer: Self-pay | Admitting: Physical Medicine and Rehabilitation

## 2017-06-17 ENCOUNTER — Other Ambulatory Visit: Payer: Self-pay | Admitting: Internal Medicine

## 2017-06-17 ENCOUNTER — Ambulatory Visit (INDEPENDENT_AMBULATORY_CARE_PROVIDER_SITE_OTHER): Payer: Medicare Other

## 2017-06-17 DIAGNOSIS — Z7982 Long term (current) use of aspirin: Secondary | ICD-10-CM | POA: Diagnosis not present

## 2017-06-17 DIAGNOSIS — G9009 Other idiopathic peripheral autonomic neuropathy: Secondary | ICD-10-CM | POA: Diagnosis not present

## 2017-06-17 DIAGNOSIS — G8929 Other chronic pain: Secondary | ICD-10-CM | POA: Diagnosis not present

## 2017-06-17 DIAGNOSIS — M461 Sacroiliitis, not elsewhere classified: Secondary | ICD-10-CM | POA: Diagnosis not present

## 2017-06-17 DIAGNOSIS — I509 Heart failure, unspecified: Secondary | ICD-10-CM | POA: Diagnosis not present

## 2017-06-17 DIAGNOSIS — Z9181 History of falling: Secondary | ICD-10-CM | POA: Diagnosis not present

## 2017-06-17 DIAGNOSIS — M1991 Primary osteoarthritis, unspecified site: Secondary | ICD-10-CM | POA: Diagnosis not present

## 2017-06-17 DIAGNOSIS — M961 Postlaminectomy syndrome, not elsewhere classified: Secondary | ICD-10-CM

## 2017-06-17 MED ORDER — BUPIVACAINE HCL 0.5 % IJ SOLN
3.0000 mL | INTRAMUSCULAR | Status: AC | PRN
  Administered 2017-06-17: 3 mL via INTRA_ARTICULAR

## 2017-06-17 MED ORDER — TRIAMCINOLONE ACETONIDE 40 MG/ML IJ SUSP
40.0000 mg | INTRAMUSCULAR | Status: AC | PRN
  Administered 2017-06-17: 40 mg via INTRA_ARTICULAR

## 2017-06-17 NOTE — Progress Notes (Deleted)
Patient reports pain on both sides of "sacrum area." Sometimes has spasms up back.

## 2017-06-17 NOTE — Progress Notes (Signed)
Haley Harris - 83 y.o. female MRN [id number]  Date of birth: 1928-08-20  Office Visit Note: Visit Date: 06/17/2017 PCP: Venia Carbon, MD Referred by: Venia Carbon, MD  Subjective: Chief Complaint  Patient presents with  . Lower Back - Pain   HPI: Haley Harris is a very pleasant 82 year old female with prior lumbar sacrum.  I have seen her in the past for intra-articular hip injection which is helped her greatly.  He recently saw Benjiman Core who suggested bilateral sacroiliac joint injections diagnostically and therapeutically for her sacral pain.  We will complete that today    ROS Otherwise per HPI.  Assessment & Plan: Visit Diagnoses:  1. Sacroiliitis ([place])   2. Post laminectomy syndrome     Plan: Findings:  Bilateral sacroiliac joint injections with fluoroscopic guidance.  There was good placement of the needle tip with biplanar imaging.  Flow of contrast showed partial arthrogram at least through the bottom half of the joint.    Meds & Orders: No orders of the defined types were placed in this encounter.   Orders Placed This Encounter  Procedures  . Sacroiliac Joint Inj  . XR C-ARM NO REPORT    Follow-up: Return for Dr. Louanne Skye as scheduled.   Procedures: Sacroiliac Joint Inj on 06/17/2017 10:21 AM Indications: pain and diagnostic evaluation Details: 22 G 3.5 in needle, fluoroscopy-guided posterior approach Medications (Right): 40 mg triamcinolone acetonide 40 MG/ML; 3 mL bupivacaine 0.5 % Medications (Left): 40 mg triamcinolone acetonide 40 MG/ML; 3 mL bupivacaine 0.5 % Outcome: tolerated well, no immediate complications  There was excellent flow of contrast producing a partial arthrograms of the sacroiliac joints.  Procedure, treatment alternatives, risks and benefits explained, specific risks discussed. Consent was given by the patient. Immediately prior to procedure a time out was called to verify the correct patient, procedure, equipment, support staff and  site/side marked as required. Patient was prepped and draped in the usual sterile fashion.      No notes on file   Clinical History: No specialty comments available.  She reports that she quit smoking about 42 years ago. she has never used smokeless tobacco.  Recent Labs    12/15/16 1054  LABURIC 10.7*    Objective:  VS:  HT:    WT:   BMI:     BP:   HR: bpm  TEMP: ( )  RESP:  Physical Exam  Pulmonary/Chest:  Patient on supplemental oxygen by nasal  Musculoskeletal:  Patient ambulates with a forward flexed cervical spine using rolling walker.    Ortho Exam Imaging: Xr C-arm No Report  Result Date: 06/17/2017 Please see Notes or Procedures tab for imaging impression.   Past Medical/Family/Surgical/Social History: Medications & Allergies reviewed per EMR Patient Active Problem List   Diagnosis Date Noted  . Diarrhea 05/13/2017  . Low back pain 05/13/2017  . Acute on chronic respiratory failure with hypoxia ([place]) 03/13/2017  . Chronic hypoxemic respiratory failure ([place]) 03/09/2017  . DOE (dyspnea on exertion) 01/21/2017  . Gout   . Pain of finger of left hand 12/15/2016  . Benign hypertensive heart disease with CHF and with combined systolic and diastolic dysfunction, NYHA class 3 ([place]) 12/06/2016  . Osteoarthritis of left hip 09/28/2016  . Cervical spondylosis without myelopathy 07/31/2016  . Primary osteoarthritis of both first carpometacarpal joints 07/31/2016  . Acute on chronic combined systolic and diastolic heart failure ([place]) 07/22/2016  . Spinal stenosis in cervical region 05/29/2016  . Venous stasis dermatitis  11/13/2015  . Essential hypertension 10/30/2015  . Diverticulitis of colon 02/18/2015  . Hyponatremia   . Chronic combined systolic and diastolic CHF (congestive heart failure) ([place])   . Chronic kidney disease, stage III (moderate) (HCC)   . PVD (peripheral vascular disease) ([place])   . Right adrenal mass ([place]) 01/18/2015  . Chronic venous  insufficiency 12/18/2014  . Advanced directives, counseling/discussion 04/02/2014  . Neuropathy due to peripheral vascular disease ([place]) 03/11/2014  . Routine general medical examination at a health care facility 03/02/2013  . Breast cancer ([place]) 12/02/2011  . Constipation due to pain medication 10/28/2011  . T12 compression fracture ([place]) 10/28/2011  . Atherosclerotic heart disease of native coronary artery with angina pectoris ([place]) 06/05/2011  . Dyslipidemia 10/30/2006  . Episodic mood disorder ([place]) 10/30/2006  . COPD (chronic obstructive pulmonary disease) ([place]) 10/30/2006  . GERD 10/30/2006  . Osteoarthritis 10/30/2006  . OP (osteoporosis) 10/30/2006  . URINARY INCONTINENCE 10/30/2006   Past Medical History:  Diagnosis Date  . ACE-inhibitor cough   . Allergic rhinitis, cause unspecified   . Angina   . Anxiety   . CHF (congestive heart failure) ([place])   . Chronic airway obstruction, not elsewhere classified   . Compression fracture of T12 vertebra ([place]) 10/28/2011  . Depressive disorder, not elsewhere classified   . Esophageal reflux   . Gout   . Heart murmur    aS CHILD  . Malignant neoplasm of breast (female), unspecified site   . Myocardial infarct ([place])   . Neuromuscular disorder ([place])    NEROPATHY FROM STENOSIS  . Occlusion and stenosis of carotid artery without mention of cerebral infarction   . Osteoarthrosis, unspecified whether generalized or localized, unspecified site   . Osteoporosis, unspecified   . Other and unspecified hyperlipidemia   . Other dyspnea and respiratory abnormality   . Oxygen dependent   . Peripheral vascular disease, unspecified ([place])   . Personal history of malignant neoplasm of breast   . Pneumonia   . Recurrent upper respiratory infection (URI)   . Shortness of breath   . Spinal stenosis   . Unspecified cardiovascular disease   . Unspecified essential hypertension   . Unspecified hearing loss   . Unspecified urinary incontinence     Family History  Problem Relation Age of Onset  . Heart attack Father 66  . Cancer Mother        ovarian  . Cancer Brother        lung  . Arthritis Unknown        family   Past Surgical History:  Procedure Laterality Date  . ABDOMINAL HYSTERECTOMY    . APPENDECTOMY  1948  . BREAST IMPLANT REMOVAL  06/12/09   right  . BREAST RECONSTRUCTION  1998   Reconstruction   . CARDIAC CATHETERIZATION N/A 01/25/2015   Procedure: Left Heart Cath and Coronary Angiography;  Surgeon: Charolette Forward, MD;  Location: [facility];  Service: Cardiovascular;  Laterality: N/A;  . CORONARY ANGIOPLASTY  11/12   distal RCA  . FLEXIBLE SIGMOIDOSCOPY N/A 01/21/2015   Procedure: FLEXIBLE SIGMOIDOSCOPY;  Surgeon: Richmond Campbell, MD;  Location: [facility] ENDOSCOPY;  Service: Endoscopy;  Laterality: N/A;  . FLEXIBLE SIGMOIDOSCOPY  01/22/2015      . FRACTURE SURGERY     fracture right elbow  . LEFT HEART CATH AND CORONARY ANGIOGRAPHY N/A 11/14/2016   Procedure: Left Heart Cath and Coronary Angiography;  Surgeon: Charolette Forward, MD;  Location: [facility];  Service: Cardiovascular;  Laterality: N/A;  . LEFT HEART CATHETERIZATION WITH CORONARY ANGIOGRAM N/A 06/05/2011   Procedure: LEFT HEART CATHETERIZATION WITH CORONARY ANGIOGRAM;  Surgeon: Clent Demark, MD;  Location: [facility] CATH LAB;  Service: Cardiovascular;  Laterality: N/A;  . LEFT HEART CATHETERIZATION WITH CORONARY ANGIOGRAM N/A 03/05/2014   Procedure: LEFT HEART CATHETERIZATION WITH CORONARY ANGIOGRAM;  Surgeon: Clent Demark, MD;  Location: [facility] LAB;  Service: Cardiovascular;  Laterality: N/A;  . LUMBAR LAMINECTOMY  2003  . MASTECTOMY  1987   Right  . MASTECTOMY    . MASTOIDECTOMY  childhood  . REDUCTION MAMMAPLASTY  1998   Left  . SHOULDER SURGERY  02/2005   Bilateral fractures with multiple surgeries   Social History   Occupational History  . Occupation: Art therapist with [employer]    Comment: Retired    Fish farm manager: [employer]   Tobacco Use  . Smoking status: Former Smoker    Last attempt to quit: 07/13/1974    Years since quitting: 42.9  . Smokeless tobacco: Never Used  Substance and Sexual Activity  . Alcohol use: Yes    Alcohol/week: 0.0 oz    Comment: occasional  . Drug use: No  . Sexual activity: No    Birth control/protection: Post-menopausal

## 2017-06-17 NOTE — Patient Instructions (Signed)

## 2017-06-18 DIAGNOSIS — Z7982 Long term (current) use of aspirin: Secondary | ICD-10-CM | POA: Diagnosis not present

## 2017-06-18 DIAGNOSIS — I509 Heart failure, unspecified: Secondary | ICD-10-CM | POA: Diagnosis not present

## 2017-06-18 DIAGNOSIS — M1991 Primary osteoarthritis, unspecified site: Secondary | ICD-10-CM | POA: Diagnosis not present

## 2017-06-18 DIAGNOSIS — G9009 Other idiopathic peripheral autonomic neuropathy: Secondary | ICD-10-CM | POA: Diagnosis not present

## 2017-06-18 DIAGNOSIS — G8929 Other chronic pain: Secondary | ICD-10-CM | POA: Diagnosis not present

## 2017-06-18 DIAGNOSIS — Z9181 History of falling: Secondary | ICD-10-CM | POA: Diagnosis not present

## 2017-06-24 DIAGNOSIS — M1991 Primary osteoarthritis, unspecified site: Secondary | ICD-10-CM | POA: Diagnosis not present

## 2017-06-24 DIAGNOSIS — G8929 Other chronic pain: Secondary | ICD-10-CM | POA: Diagnosis not present

## 2017-06-24 DIAGNOSIS — Z7982 Long term (current) use of aspirin: Secondary | ICD-10-CM | POA: Diagnosis not present

## 2017-06-24 DIAGNOSIS — I5020 Unspecified systolic (congestive) heart failure: Secondary | ICD-10-CM | POA: Diagnosis not present

## 2017-06-24 DIAGNOSIS — Z9181 History of falling: Secondary | ICD-10-CM | POA: Diagnosis not present

## 2017-06-24 DIAGNOSIS — I509 Heart failure, unspecified: Secondary | ICD-10-CM | POA: Diagnosis not present

## 2017-06-24 DIAGNOSIS — G9009 Other idiopathic peripheral autonomic neuropathy: Secondary | ICD-10-CM | POA: Diagnosis not present

## 2017-06-25 ENCOUNTER — Telehealth: Payer: Self-pay

## 2017-06-25 NOTE — Telephone Encounter (Signed)
That is okay.

## 2017-06-25 NOTE — Telephone Encounter (Signed)
Copied from [place]. Topic: General - Other >> Jun 25, 2017 11:19 AM Synthia Innocent wrote: Reason for CRM: Need verbal orders for Skilled Nursing and Aide

## 2017-06-25 NOTE — Telephone Encounter (Signed)
I spoke with Haley Harris with [facility] and she is requesting [place] skilled nursing 1 x a week for 2 weeks and HH aide 2 x a week for 2 weeks. If no answer can leave detailed v/m.

## 2017-06-25 NOTE — Telephone Encounter (Signed)
Detailed message left with verbal orders

## 2017-06-29 DIAGNOSIS — M1991 Primary osteoarthritis, unspecified site: Secondary | ICD-10-CM | POA: Diagnosis not present

## 2017-06-29 DIAGNOSIS — Z9181 History of falling: Secondary | ICD-10-CM | POA: Diagnosis not present

## 2017-06-29 DIAGNOSIS — I509 Heart failure, unspecified: Secondary | ICD-10-CM | POA: Diagnosis not present

## 2017-06-29 DIAGNOSIS — G9009 Other idiopathic peripheral autonomic neuropathy: Secondary | ICD-10-CM | POA: Diagnosis not present

## 2017-06-29 DIAGNOSIS — G8929 Other chronic pain: Secondary | ICD-10-CM | POA: Diagnosis not present

## 2017-06-29 DIAGNOSIS — Z7982 Long term (current) use of aspirin: Secondary | ICD-10-CM | POA: Diagnosis not present

## 2017-06-30 ENCOUNTER — Ambulatory Visit (INDEPENDENT_AMBULATORY_CARE_PROVIDER_SITE_OTHER): Payer: Medicare Other | Admitting: Specialist

## 2017-06-30 ENCOUNTER — Encounter (INDEPENDENT_AMBULATORY_CARE_PROVIDER_SITE_OTHER): Payer: Self-pay | Admitting: Specialist

## 2017-06-30 VITALS — BP 156/89 | HR 86

## 2017-06-30 DIAGNOSIS — M533 Sacrococcygeal disorders, not elsewhere classified: Secondary | ICD-10-CM | POA: Diagnosis not present

## 2017-06-30 DIAGNOSIS — M48062 Spinal stenosis, lumbar region with neurogenic claudication: Secondary | ICD-10-CM

## 2017-06-30 DIAGNOSIS — Z981 Arthrodesis status: Secondary | ICD-10-CM

## 2017-06-30 NOTE — Progress Notes (Signed)
Office Visit Note   Patient: Haley Harris           Date of Birth: 07-Oct-1928           MRN: [id number] Visit Date: 06/30/2017              Requested by: Venia Carbon, MD [place], [place] [address] PCP: Venia Carbon, MD   Assessment & Plan: Visit Diagnoses:  1. History of lumbar spinal fusion   2. Sacroiliac joint dysfunction of both sides   3. Spinal stenosis of lumbar region with neurogenic claudication     Plan: Avoid bending, stooping and avoid lifting weights greater than 10 lbs. Avoid prolong standing and walking. Avoid frequent bending and stooping  No lifting greater than 10 lbs. May use ice or moist heat for pain. Weight loss is of benefit. Handicap license is approved. Call if pain recurrs and we would  Have Dr. Romona Curls secretary/Assistant will call to arrange for further steroid injection   Follow-Up Instructions: Return in about 3 months (around 09/28/2017).   Orders:  No orders of the defined types were placed in this encounter.  No orders of the defined types were placed in this encounter.     Procedures: No procedures performed   Clinical Data: No additional findings.   Subjective: No chief complaint on file.   83 year old female with knee osteoarthritis and lumbar spinal stenosis and     Review of Systems  Constitutional: Negative.   HENT: Negative.   Eyes: Negative.   Respiratory: Negative.   Cardiovascular: Negative.   Gastrointestinal: Negative.   Endocrine: Negative.   Genitourinary: Negative.   Musculoskeletal: Negative.   Skin: Negative.   Allergic/Immunologic: Negative.   Neurological: Negative.   Hematological: Negative.   Psychiatric/Behavioral: Negative.      Objective: Vital Signs: BP (!) 156/89   Pulse 86   Physical Exam  Constitutional: She is oriented to person, place, and time. She appears well-developed and well-nourished.  HENT:  Head: Normocephalic and atraumatic.  Eyes: EOM  are normal. Pupils are equal, round, and reactive to light.  Neck: Normal range of motion. Neck supple.  Pulmonary/Chest: Effort normal and breath sounds normal.  Abdominal: Soft. Bowel sounds are normal.  Neurological: She is alert and oriented to person, place, and time.  Skin: Skin is warm and dry.  Psychiatric: She has a normal mood and affect. Her behavior is normal. Judgment and thought content normal.    Back Exam   Tenderness  The patient is experiencing tenderness in the lumbar.  Range of Motion  Extension: abnormal  Flexion: abnormal  Lateral bend right: abnormal  Lateral bend left: abnormal  Rotation right: abnormal  Rotation left: abnormal   Muscle Strength  Right Quadriceps:  5/5  Left Quadriceps:  5/5  Right Hamstrings:  5/5  Left Hamstrings:  5/5   Tests  Straight leg raise right: negative Straight leg raise left: negative  Reflexes  Patellar: normal Achilles: normal Babinski's sign: normal   Other  Toe walk: abnormal Heel walk: abnormal Sensation: normal Gait: normal  Erythema: no back redness Scars: absent      Specialty Comments:  No specialty comments available.  Imaging: No results found.   PMFS History: Patient Active Problem List   Diagnosis Date Noted  . Diarrhea 05/13/2017  . Low back pain 05/13/2017  . Acute on chronic respiratory failure with hypoxia ([place]) 03/13/2017  . Chronic hypoxemic respiratory failure ([place]) 03/09/2017  .  DOE (dyspnea on exertion) 01/21/2017  . Gout   . Pain of finger of left hand 12/15/2016  . Benign hypertensive heart disease with CHF and with combined systolic and diastolic dysfunction, NYHA class 3 ([place]) 12/06/2016  . Osteoarthritis of left hip 09/28/2016  . Cervical spondylosis without myelopathy 07/31/2016  . Primary osteoarthritis of both first carpometacarpal joints 07/31/2016  . Acute on chronic combined systolic and diastolic heart failure ([place]) 07/22/2016  . Spinal stenosis in cervical  region 05/29/2016  . Venous stasis dermatitis 11/13/2015  . Essential hypertension 10/30/2015  . Diverticulitis of colon 02/18/2015  . Hyponatremia   . Chronic combined systolic and diastolic CHF (congestive heart failure) ([place])   . Chronic kidney disease, stage III (moderate) (HCC)   . PVD (peripheral vascular disease) ([place])   . Right adrenal mass ([place]) 01/18/2015  . Chronic venous insufficiency 12/18/2014  . Advanced directives, counseling/discussion 04/02/2014  . Neuropathy due to peripheral vascular disease ([place]) 03/11/2014  . Routine general medical examination at a health care facility 03/02/2013  . Breast cancer ([place]) 12/02/2011  . Constipation due to pain medication 10/28/2011  . T12 compression fracture ([place]) 10/28/2011  . Atherosclerotic heart disease of native coronary artery with angina pectoris ([place]) 06/05/2011  . Dyslipidemia 10/30/2006  . Episodic mood disorder ([place]) 10/30/2006  . COPD (chronic obstructive pulmonary disease) ([place]) 10/30/2006  . GERD 10/30/2006  . Osteoarthritis 10/30/2006  . OP (osteoporosis) 10/30/2006  . URINARY INCONTINENCE 10/30/2006   Past Medical History:  Diagnosis Date  . ACE-inhibitor cough   . Allergic rhinitis, cause unspecified   . Angina   . Anxiety   . CHF (congestive heart failure) ([place])   . Chronic airway obstruction, not elsewhere classified   . Compression fracture of T12 vertebra ([place]) 10/28/2011  . Depressive disorder, not elsewhere classified   . Esophageal reflux   . Gout   . Heart murmur    aS CHILD  . Malignant neoplasm of breast (female), unspecified site   . Myocardial infarct ([place])   . Neuromuscular disorder ([place])    NEROPATHY FROM STENOSIS  . Occlusion and stenosis of carotid artery without mention of cerebral infarction   . Osteoarthrosis, unspecified whether generalized or localized, unspecified site   . Osteoporosis, unspecified   . Other and unspecified hyperlipidemia   . Other dyspnea and respiratory  abnormality   . Oxygen dependent   . Peripheral vascular disease, unspecified ([place])   . Personal history of malignant neoplasm of breast   . Pneumonia   . Recurrent upper respiratory infection (URI)   . Shortness of breath   . Spinal stenosis   . Unspecified cardiovascular disease   . Unspecified essential hypertension   . Unspecified hearing loss   . Unspecified urinary incontinence     Family History  Problem Relation Age of Onset  . Heart attack Father 58  . Cancer Mother        ovarian  . Cancer Brother        lung  . Arthritis Unknown        family    Past Surgical History:  Procedure Laterality Date  . ABDOMINAL HYSTERECTOMY    . APPENDECTOMY  1948  . BREAST IMPLANT REMOVAL  06/12/09   right  . BREAST RECONSTRUCTION  1998   Reconstruction   . CARDIAC CATHETERIZATION N/A 01/25/2015   Procedure: Left Heart Cath and Coronary Angiography;  Surgeon: Charolette Forward, MD;  Location: [facility];  Service: Cardiovascular;  Laterality: N/A;  .  CORONARY ANGIOPLASTY  11/12   distal RCA  . FLEXIBLE SIGMOIDOSCOPY N/A 01/21/2015   Procedure: FLEXIBLE SIGMOIDOSCOPY;  Surgeon: Richmond Campbell, MD;  Location: [facility] ENDOSCOPY;  Service: Endoscopy;  Laterality: N/A;  . FLEXIBLE SIGMOIDOSCOPY  01/22/2015      . FRACTURE SURGERY     fracture right elbow  . LEFT HEART CATH AND CORONARY ANGIOGRAPHY N/A 11/14/2016   Procedure: Left Heart Cath and Coronary Angiography;  Surgeon: Charolette Forward, MD;  Location: [facility];  Service: Cardiovascular;  Laterality: N/A;  . LEFT HEART CATHETERIZATION WITH CORONARY ANGIOGRAM N/A 06/05/2011   Procedure: LEFT HEART CATHETERIZATION WITH CORONARY ANGIOGRAM;  Surgeon: Clent Demark, MD;  Location: [facility] CATH LAB;  Service: Cardiovascular;  Laterality: N/A;  . LEFT HEART CATHETERIZATION WITH CORONARY ANGIOGRAM N/A 03/05/2014   Procedure: LEFT HEART CATHETERIZATION WITH CORONARY ANGIOGRAM;  Surgeon: Clent Demark, MD;  Location: [facility] CATH LAB;  Service:  Cardiovascular;  Laterality: N/A;  . LUMBAR LAMINECTOMY  2003  . MASTECTOMY  1987   Right  . MASTECTOMY    . MASTOIDECTOMY  childhood  . REDUCTION MAMMAPLASTY  1998   Left  . SHOULDER SURGERY  02/2005   Bilateral fractures with multiple surgeries   Social History   Occupational History  . 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 Smoking status: Former Smoker    Last attempt to quit: 07/13/1974    Years since quitting: 42.9  . Smokeless tobacco: Never Used  Substance and Sexual Activity  . Alcohol use: Yes    Alcohol/week: 0.0 oz    Comment: occasional  . Drug use: No  . Sexual activity: No    Birth control/protection: Post-menopausal

## 2017-06-30 NOTE — Patient Instructions (Signed)
Avoid bending, stooping and avoid lifting weights greater than 10 lbs. Avoid prolong standing and walking. Avoid frequent bending and stooping  No lifting greater than 10 lbs. May use ice or moist heat for pain. Weight loss is of benefit. Handicap license is approved. Call if pain recurrs and we would  haveDr. Newton's secretary/Assistant will call to arrange for further steroid injection

## 2017-07-01 DIAGNOSIS — Z7982 Long term (current) use of aspirin: Secondary | ICD-10-CM | POA: Diagnosis not present

## 2017-07-01 DIAGNOSIS — G9009 Other idiopathic peripheral autonomic neuropathy: Secondary | ICD-10-CM | POA: Diagnosis not present

## 2017-07-01 DIAGNOSIS — Z9181 History of falling: Secondary | ICD-10-CM | POA: Diagnosis not present

## 2017-07-01 DIAGNOSIS — M1991 Primary osteoarthritis, unspecified site: Secondary | ICD-10-CM | POA: Diagnosis not present

## 2017-07-01 DIAGNOSIS — G8929 Other chronic pain: Secondary | ICD-10-CM | POA: Diagnosis not present

## 2017-07-01 DIAGNOSIS — I509 Heart failure, unspecified: Secondary | ICD-10-CM | POA: Diagnosis not present

## 2017-07-02 ENCOUNTER — Telehealth: Payer: Self-pay

## 2017-07-02 DIAGNOSIS — M1991 Primary osteoarthritis, unspecified site: Secondary | ICD-10-CM | POA: Diagnosis not present

## 2017-07-02 DIAGNOSIS — Z7982 Long term (current) use of aspirin: Secondary | ICD-10-CM | POA: Diagnosis not present

## 2017-07-02 DIAGNOSIS — G8929 Other chronic pain: Secondary | ICD-10-CM | POA: Diagnosis not present

## 2017-07-02 DIAGNOSIS — Z9181 History of falling: Secondary | ICD-10-CM | POA: Diagnosis not present

## 2017-07-02 DIAGNOSIS — G9009 Other idiopathic peripheral autonomic neuropathy: Secondary | ICD-10-CM | POA: Diagnosis not present

## 2017-07-02 DIAGNOSIS — I509 Heart failure, unspecified: Secondary | ICD-10-CM | POA: Diagnosis not present

## 2017-07-02 NOTE — Telephone Encounter (Signed)
See phone note already sent to Dr Darnell Level,.

## 2017-07-02 NOTE — Telephone Encounter (Signed)
Dr Silvio Pate out of office with no computer coverage.

## 2017-07-02 NOTE — Telephone Encounter (Signed)
Copied from [place] [phone]. Topic: General - Other >> Jul 02, 2017  9:40 AM Lolita Rieger, RMA wrote: Reason for CRM: Erasmo Downer form [facility] requesting an extention on pt for patient tice a week for 10 weeks also home health aide for once a week Please call [phone]

## 2017-07-02 NOTE — Telephone Encounter (Signed)
Ok to do

## 2017-07-02 NOTE — Telephone Encounter (Signed)
Left message on vm for Haley Harris notifying her since Dr. Silvio Pate is out of the office today, Dr. Danise Mina is giving the "ok" to extend home visits.

## 2017-07-02 NOTE — Telephone Encounter (Signed)
Copied from [place] [phone]. Topic: General - Other >> Jul 02, 2017  9:40 AM Lolita Rieger, RMA wrote: Reason for CRM: Erasmo Downer form [facility] requesting an extention on pt for patient tice a week for 10 weeks also home health aide for once a week Please call [phone]

## 2017-07-07 DIAGNOSIS — G9009 Other idiopathic peripheral autonomic neuropathy: Secondary | ICD-10-CM | POA: Diagnosis not present

## 2017-07-07 DIAGNOSIS — M1991 Primary osteoarthritis, unspecified site: Secondary | ICD-10-CM | POA: Diagnosis not present

## 2017-07-07 DIAGNOSIS — Z9181 History of falling: Secondary | ICD-10-CM | POA: Diagnosis not present

## 2017-07-07 DIAGNOSIS — Z7982 Long term (current) use of aspirin: Secondary | ICD-10-CM | POA: Diagnosis not present

## 2017-07-07 DIAGNOSIS — G8929 Other chronic pain: Secondary | ICD-10-CM | POA: Diagnosis not present

## 2017-07-07 DIAGNOSIS — I509 Heart failure, unspecified: Secondary | ICD-10-CM | POA: Diagnosis not present

## 2017-07-08 DIAGNOSIS — D649 Anemia, unspecified: Secondary | ICD-10-CM | POA: Diagnosis not present

## 2017-07-08 DIAGNOSIS — I2510 Atherosclerotic heart disease of native coronary artery without angina pectoris: Secondary | ICD-10-CM | POA: Diagnosis not present

## 2017-07-08 DIAGNOSIS — I509 Heart failure, unspecified: Secondary | ICD-10-CM | POA: Diagnosis not present

## 2017-07-08 DIAGNOSIS — E785 Hyperlipidemia, unspecified: Secondary | ICD-10-CM | POA: Diagnosis not present

## 2017-07-08 DIAGNOSIS — Z7982 Long term (current) use of aspirin: Secondary | ICD-10-CM | POA: Diagnosis not present

## 2017-07-08 DIAGNOSIS — G8929 Other chronic pain: Secondary | ICD-10-CM | POA: Diagnosis not present

## 2017-07-08 DIAGNOSIS — I5020 Unspecified systolic (congestive) heart failure: Secondary | ICD-10-CM | POA: Diagnosis not present

## 2017-07-08 DIAGNOSIS — Z9181 History of falling: Secondary | ICD-10-CM | POA: Diagnosis not present

## 2017-07-08 DIAGNOSIS — M1991 Primary osteoarthritis, unspecified site: Secondary | ICD-10-CM | POA: Diagnosis not present

## 2017-07-08 DIAGNOSIS — G9009 Other idiopathic peripheral autonomic neuropathy: Secondary | ICD-10-CM | POA: Diagnosis not present

## 2017-07-08 DIAGNOSIS — I119 Hypertensive heart disease without heart failure: Secondary | ICD-10-CM | POA: Diagnosis not present

## 2017-07-14 ENCOUNTER — Telehealth: Payer: Self-pay | Admitting: *Deleted

## 2017-07-14 DIAGNOSIS — Z7982 Long term (current) use of aspirin: Secondary | ICD-10-CM | POA: Diagnosis not present

## 2017-07-14 DIAGNOSIS — G9009 Other idiopathic peripheral autonomic neuropathy: Secondary | ICD-10-CM | POA: Diagnosis not present

## 2017-07-14 DIAGNOSIS — I509 Heart failure, unspecified: Secondary | ICD-10-CM | POA: Diagnosis not present

## 2017-07-14 DIAGNOSIS — M1991 Primary osteoarthritis, unspecified site: Secondary | ICD-10-CM | POA: Diagnosis not present

## 2017-07-14 DIAGNOSIS — Z9181 History of falling: Secondary | ICD-10-CM | POA: Diagnosis not present

## 2017-07-14 DIAGNOSIS — G8929 Other chronic pain: Secondary | ICD-10-CM | POA: Diagnosis not present

## 2017-07-14 NOTE — Telephone Encounter (Signed)
Please give the order.  Thanks.   

## 2017-07-14 NOTE — Telephone Encounter (Signed)
Haley Harris called to get orders to extend nursing for once a week for 6 weeks. Orders given per Dr. Damita Dunnings for Haley Harris to have the extension for skilled nursing.

## 2017-07-14 NOTE — Telephone Encounter (Signed)
Tried to call Lattie Haw back and unable to leave message because her mail box is full. Will have to try to call again later.

## 2017-07-14 NOTE — Telephone Encounter (Signed)
Copied from [place] [phone]. Topic: General - Other >> Jul 12, 2017 12:48 PM Lennox Solders wrote: Reason for CRM: lisa mitchell rn [facility] is calling lisa would like to extend skilled nursing for once a wk for 6 wks. Lattie Haw will see patient on 11-12-46 for re-certification. Please call lisa with verbal orders

## 2017-07-15 DIAGNOSIS — Z9181 History of falling: Secondary | ICD-10-CM | POA: Diagnosis not present

## 2017-07-15 DIAGNOSIS — G8929 Other chronic pain: Secondary | ICD-10-CM | POA: Diagnosis not present

## 2017-07-15 DIAGNOSIS — Z7982 Long term (current) use of aspirin: Secondary | ICD-10-CM | POA: Diagnosis not present

## 2017-07-15 DIAGNOSIS — G9009 Other idiopathic peripheral autonomic neuropathy: Secondary | ICD-10-CM | POA: Diagnosis not present

## 2017-07-15 DIAGNOSIS — I509 Heart failure, unspecified: Secondary | ICD-10-CM | POA: Diagnosis not present

## 2017-07-15 DIAGNOSIS — M1991 Primary osteoarthritis, unspecified site: Secondary | ICD-10-CM | POA: Diagnosis not present

## 2017-07-16 DIAGNOSIS — Z7982 Long term (current) use of aspirin: Secondary | ICD-10-CM | POA: Diagnosis not present

## 2017-07-16 DIAGNOSIS — G9009 Other idiopathic peripheral autonomic neuropathy: Secondary | ICD-10-CM | POA: Diagnosis not present

## 2017-07-16 DIAGNOSIS — Z9181 History of falling: Secondary | ICD-10-CM | POA: Diagnosis not present

## 2017-07-16 DIAGNOSIS — M1991 Primary osteoarthritis, unspecified site: Secondary | ICD-10-CM | POA: Diagnosis not present

## 2017-07-16 DIAGNOSIS — I509 Heart failure, unspecified: Secondary | ICD-10-CM | POA: Diagnosis not present

## 2017-07-16 DIAGNOSIS — G8929 Other chronic pain: Secondary | ICD-10-CM | POA: Diagnosis not present

## 2017-07-19 ENCOUNTER — Encounter: Payer: Self-pay | Admitting: Podiatry

## 2017-07-19 ENCOUNTER — Ambulatory Visit: Payer: Medicare Other | Admitting: Podiatry

## 2017-07-19 DIAGNOSIS — R609 Edema, unspecified: Secondary | ICD-10-CM

## 2017-07-19 DIAGNOSIS — M79609 Pain in unspecified limb: Secondary | ICD-10-CM

## 2017-07-19 DIAGNOSIS — B351 Tinea unguium: Secondary | ICD-10-CM | POA: Diagnosis not present

## 2017-07-19 NOTE — Progress Notes (Signed)
Complaint:  Visit Type: Patient returns to my office for continued preventative foot care services. Complaint: Patient states" my nails have grown long and thick and become painful to walk and wear shoes". The patient presents for preventative foot care services. No changes to ROS.    Podiatric Exam: Vascular: dorsalis pedis and posterior tibial pulses are palpable bilateral. Capillary return is immediate. Temperature gradient is WNL. Skin turgor WNL  Sensorium: Normal Semmes Weinstein monofilament test. Normal tactile sensation bilaterally. Nail Exam: Pt has thick disfigured discolored nails with subungual debris noted bilateral entire nail hallux through fifth toenails.   Ulcer Exam: There is no evidence of ulcer or pre-ulcerative changes or infection. Orthopedic Exam: Muscle tone and strength are WNL. No limitations in general ROM. No crepitus or effusions noted. Hammer toes  B/L Bony prominences are unremarkable. Swelling feet  B/L Skin: No Porokeratosis. No infection or ulcers  Diagnosis:  Onychomycosis, , Pain in right toe, pain in left toe    Treatment & Plan Procedures and Treatment: Consent by patient was obtained for treatment procedures. The patient understood the discussion of treatment and procedures well. All questions were answered thoroughly reviewed. Debridement of mycotic and hypertrophic toenails, 1 through 5 bilateral and clearing of subungual debris. No ulceration, no infection noted.   Return Visit-Office Procedure: Patient instructed to return to the office for a follow up visit 3 months for continued evaluation and treatment.    Gardiner Barefoot DPM

## 2017-07-20 DIAGNOSIS — M1991 Primary osteoarthritis, unspecified site: Secondary | ICD-10-CM | POA: Diagnosis not present

## 2017-07-20 DIAGNOSIS — Z7982 Long term (current) use of aspirin: Secondary | ICD-10-CM | POA: Diagnosis not present

## 2017-07-20 DIAGNOSIS — Z9181 History of falling: Secondary | ICD-10-CM | POA: Diagnosis not present

## 2017-07-20 DIAGNOSIS — G8929 Other chronic pain: Secondary | ICD-10-CM | POA: Diagnosis not present

## 2017-07-20 DIAGNOSIS — G9009 Other idiopathic peripheral autonomic neuropathy: Secondary | ICD-10-CM | POA: Diagnosis not present

## 2017-07-20 DIAGNOSIS — I509 Heart failure, unspecified: Secondary | ICD-10-CM | POA: Diagnosis not present

## 2017-07-21 DIAGNOSIS — I509 Heart failure, unspecified: Secondary | ICD-10-CM | POA: Diagnosis not present

## 2017-07-21 DIAGNOSIS — M1991 Primary osteoarthritis, unspecified site: Secondary | ICD-10-CM | POA: Diagnosis not present

## 2017-07-21 DIAGNOSIS — G8929 Other chronic pain: Secondary | ICD-10-CM | POA: Diagnosis not present

## 2017-07-21 DIAGNOSIS — G9009 Other idiopathic peripheral autonomic neuropathy: Secondary | ICD-10-CM | POA: Diagnosis not present

## 2017-07-21 DIAGNOSIS — Z9181 History of falling: Secondary | ICD-10-CM | POA: Diagnosis not present

## 2017-07-21 DIAGNOSIS — Z7982 Long term (current) use of aspirin: Secondary | ICD-10-CM | POA: Diagnosis not present

## 2017-07-22 DIAGNOSIS — G8929 Other chronic pain: Secondary | ICD-10-CM | POA: Diagnosis not present

## 2017-07-22 DIAGNOSIS — M1991 Primary osteoarthritis, unspecified site: Secondary | ICD-10-CM | POA: Diagnosis not present

## 2017-07-22 DIAGNOSIS — Z9181 History of falling: Secondary | ICD-10-CM | POA: Diagnosis not present

## 2017-07-22 DIAGNOSIS — G9009 Other idiopathic peripheral autonomic neuropathy: Secondary | ICD-10-CM | POA: Diagnosis not present

## 2017-07-22 DIAGNOSIS — I509 Heart failure, unspecified: Secondary | ICD-10-CM | POA: Diagnosis not present

## 2017-07-22 DIAGNOSIS — Z7982 Long term (current) use of aspirin: Secondary | ICD-10-CM | POA: Diagnosis not present

## 2017-07-23 DIAGNOSIS — Z9181 History of falling: Secondary | ICD-10-CM | POA: Diagnosis not present

## 2017-07-23 DIAGNOSIS — Z7982 Long term (current) use of aspirin: Secondary | ICD-10-CM | POA: Diagnosis not present

## 2017-07-23 DIAGNOSIS — G9009 Other idiopathic peripheral autonomic neuropathy: Secondary | ICD-10-CM | POA: Diagnosis not present

## 2017-07-23 DIAGNOSIS — G8929 Other chronic pain: Secondary | ICD-10-CM | POA: Diagnosis not present

## 2017-07-23 DIAGNOSIS — I509 Heart failure, unspecified: Secondary | ICD-10-CM | POA: Diagnosis not present

## 2017-07-23 DIAGNOSIS — M1991 Primary osteoarthritis, unspecified site: Secondary | ICD-10-CM | POA: Diagnosis not present

## 2017-07-25 DIAGNOSIS — I5020 Unspecified systolic (congestive) heart failure: Secondary | ICD-10-CM | POA: Diagnosis not present

## 2017-07-26 DIAGNOSIS — G8929 Other chronic pain: Secondary | ICD-10-CM | POA: Diagnosis not present

## 2017-07-26 DIAGNOSIS — Z9181 History of falling: Secondary | ICD-10-CM | POA: Diagnosis not present

## 2017-07-26 DIAGNOSIS — Z7982 Long term (current) use of aspirin: Secondary | ICD-10-CM | POA: Diagnosis not present

## 2017-07-26 DIAGNOSIS — I509 Heart failure, unspecified: Secondary | ICD-10-CM | POA: Diagnosis not present

## 2017-07-26 DIAGNOSIS — G9009 Other idiopathic peripheral autonomic neuropathy: Secondary | ICD-10-CM | POA: Diagnosis not present

## 2017-07-26 DIAGNOSIS — M1991 Primary osteoarthritis, unspecified site: Secondary | ICD-10-CM | POA: Diagnosis not present

## 2017-07-27 DIAGNOSIS — G8929 Other chronic pain: Secondary | ICD-10-CM | POA: Diagnosis not present

## 2017-07-27 DIAGNOSIS — Z7982 Long term (current) use of aspirin: Secondary | ICD-10-CM | POA: Diagnosis not present

## 2017-07-27 DIAGNOSIS — M1991 Primary osteoarthritis, unspecified site: Secondary | ICD-10-CM | POA: Diagnosis not present

## 2017-07-27 DIAGNOSIS — G9009 Other idiopathic peripheral autonomic neuropathy: Secondary | ICD-10-CM | POA: Diagnosis not present

## 2017-07-27 DIAGNOSIS — Z9181 History of falling: Secondary | ICD-10-CM | POA: Diagnosis not present

## 2017-07-27 DIAGNOSIS — I509 Heart failure, unspecified: Secondary | ICD-10-CM | POA: Diagnosis not present

## 2017-07-28 ENCOUNTER — Ambulatory Visit: Payer: Medicare Other | Admitting: Internal Medicine

## 2017-07-28 ENCOUNTER — Encounter: Payer: Self-pay | Admitting: Internal Medicine

## 2017-07-28 VITALS — BP 128/88 | Temp 97.6°F | Wt 179.5 lb

## 2017-07-28 DIAGNOSIS — J9611 Chronic respiratory failure with hypoxia: Secondary | ICD-10-CM | POA: Diagnosis not present

## 2017-07-28 DIAGNOSIS — I5042 Chronic combined systolic (congestive) and diastolic (congestive) heart failure: Secondary | ICD-10-CM | POA: Diagnosis not present

## 2017-07-28 MED ORDER — FUROSEMIDE 40 MG PO TABS: 80.0000 mg | ORAL_TABLET | Freq: Every day | ORAL | 0 refills | Status: DC

## 2017-07-28 NOTE — Assessment & Plan Note (Signed)
Does poorly off oxygen at all

## 2017-07-28 NOTE — Assessment & Plan Note (Signed)
Has required increase in furosemide May have some muscle wasting which is changing baseline weight Still with PT but may be running out--- needs to do HEP May need home aides due to daughter's medical issues---can't afford Can't sleep in bed ---needs HOB elevated over 30 degrees. Rx written for hospital bed Continue current care otherwise

## 2017-07-28 NOTE — Progress Notes (Signed)
Subjective:    Patient ID: Haley Harris, female    DOB: 02/23/1929, 84 y.o.   MRN: [id number]  HPI Here with daughter for follow up of CHF Having trouble with increased leg swelling Furosemide up to 80mg  daily and adds another 40mg  at lunch if up Home weight is 175#----fairly stable  No change in DOE--but fairly severe Uses the oxygen all the time---and really gets dyspneic if she takes it off PT feels she is reaching the end of her therapy Still can't shower on her own, etc  Sleeps in recliner--trouble getting in and out of bed Also needs to keep her head up at least 30 degrees due to her combined CHF. Not able to tolerate a bed wedge and it doesn't provide relief of her breathing problems.  Weakness and dyspnea cause the patient ot require frequent changes in body position which cannot be achieved with a normal bed  Back pain is better Had 2 cortisone shots in S-I joint Some muscle spasms still Uses voltaren gel--this helps (tries to limit)  No chest pain No palpitations No dizziness or syncope--has to move slowly  Current Outpatient Medications on File Prior to Visit  Medication Sig Dispense Refill  . acetaminophen (TYLENOL) 500 MG tablet Take 1,000 mg by mouth every 8 (eight) hours as needed for mild pain.     Marland Kitchen albuterol (PROVENTIL) (2.5 MG/3ML) 0.083% nebulizer solution Take 3 mLs (2.5 mg total) by nebulization every 6 (six) hours as needed for wheezing or shortness of breath. 540 mL 3  . aspirin 81 MG tablet Take 81 mg by mouth every evening.     Marland Kitchen atorvastatin (LIPITOR) 40 MG tablet TAKE 1 TABLET BY MOUTH  DAILY AT 6 PM. 90 tablet 3  . Calcium Carbonate-Vitamin D (CALTRATE 600+D PO) Take 1 tablet by mouth daily after lunch.     . carvedilol (COREG) 6.25 MG tablet Take 1 tablet (6.25 mg total) by mouth 2 (two) times daily with a meal. Take 1 tablet by mouth two  times daily with a meal 60 tablet 3  . escitalopram (LEXAPRO) 10 MG tablet TAKE 1 TABLET BY MOUTH  DAILY 90  tablet 3  . famotidine (PEPCID) 20 MG tablet TAKE 1 TABLET BY MOUTH TWO  TIMES DAILY 180 tablet 3  . fluticasone (FLONASE) 50 MCG/ACT nasal spray Use 2 sprays in each  nostril daily 48 g 3  . furosemide (LASIX) 40 MG tablet Take 1 tablet (40 mg total) by mouth daily. Take 1 pill twice daily if weight has elevated. (Patient taking differently: Take 40 mg by mouth daily. Take two tabs in the am and one tab in the afternoon is weight is up 2lbs) 135 tablet 3  . gabapentin (NEURONTIN) 100 MG capsule TAKE 1 CAPSULE BY MOUTH TWO TIMES DAILY 180 capsule 3  . losartan (COZAAR) 50 MG tablet TAKE 1 TABLET BY MOUTH  DAILY 90 tablet 3  . Melatonin 3 MG TABS Take 1 tablet by mouth at bedtime.     . montelukast (SINGULAIR) 10 MG tablet TAKE 1 TABLET BY MOUTH  DAILY 90 tablet 3  . Morphine Sulfate (MORPHINE CONCENTRATE) 10 mg / 0.5 ml concentrated solution Place 0.25-0.5 mLs (5-10 mg total) under the tongue every 2 (two) hours as needed for severe pain or shortness of breath. 30 mL 0  . Multiple Vitamin (MULITIVITAMIN WITH MINERALS) TABS Take 1 tablet by mouth daily after lunch.     . nitroGLYCERIN (NITROSTAT) 0.4 MG SL tablet Place  1 tablet (0.4 mg total) under the tongue every 5 (five) minutes x 3 doses as needed for chest pain. 25 tablet 2  . PROAIR HFA 108 (90 Base) MCG/ACT inhaler USE 2 PUFFS EVERY 6 HOURS AS NEEDED FOR WHEEZING OR SHORTNESS OF BREATH 8.5 g 5  . spironolactone (ALDACTONE) 25 MG tablet TAKE 1 TABLET BY MOUTH  DAILY 90 tablet 3  . vitamin B-12 (CYANOCOBALAMIN) 500 MCG tablet Take 1,000 mcg by mouth 3 (three) times a week. Mon, Wed, Fri     No current facility-administered medications on file prior to visit.     Allergies  Allergen Reactions  . Amoxicillin-Pot Clavulanate Anaphylaxis  . Penicillins Anaphylaxis  . Cephalexin Other (See Comments)    Reaction unknown  . Clarithromycin Other (See Comments)    Reaction unknown  . Lidocaine     Patient had tremors following use of lidocaine  pain patches  . Nifedipine Other (See Comments)    Reaction unknown  . Nsaids Other (See Comments)    Heart issue  . Olmesartan Medoxomil     REACTION: cough;  But tolerating Losartan 02/2014  . Tramadol Hcl Other (See Comments)    loopy    Past Medical History:  Diagnosis Date  . ACE-inhibitor cough   . Allergic rhinitis, cause unspecified   . Angina   . Anxiety   . CHF (congestive heart failure) ([place])   . Chronic airway obstruction, not elsewhere classified   . Compression fracture of T12 vertebra ([place]) 10/28/2011  . Depressive disorder, not elsewhere classified   . Esophageal reflux   . Gout   . Heart murmur    aS CHILD  . Malignant neoplasm of breast (female), unspecified site   . Myocardial infarct ([place])   . 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 Occlusion and stenosis of carotid artery without mention of cerebral infarction   . Osteoarthrosis, unspecified whether generalized or localized, unspecified site   . Osteoporosis, unspecified   . Other and unspecified hyperlipidemia   . Other dyspnea and respiratory abnormality   . Oxygen dependent   . Peripheral vascular disease, unspecified ([place])   . Personal history of malignant neoplasm of breast   . Pneumonia   . Recurrent upper respiratory infection (URI)   . Shortness of breath   . Spinal stenosis   . Unspecified cardiovascular disease   . Unspecified essential hypertension   . Unspecified hearing loss   . Unspecified urinary incontinence     Past Surgical History:  Procedure Laterality Date  . ABDOMINAL HYSTERECTOMY    . APPENDECTOMY  1948  . BREAST IMPLANT REMOVAL  06/12/09   right  . BREAST RECONSTRUCTION  1998   Reconstruction   . CARDIAC CATHETERIZATION N/A 01/25/2015   Procedure: Left Heart Cath and Coronary Angiography;  Surgeon: Charolette Forward, MD;  Location: [place] [facility];  Service: Cardiovascular;  Laterality: N/A;  . CORONARY ANGIOPLASTY  11/12   distal RCA  . FLEXIBLE  SIGMOIDOSCOPY N/A 01/21/2015   Procedure: FLEXIBLE SIGMOIDOSCOPY;  Surgeon: Richmond Campbell, MD;  Location: [facility] ENDOSCOPY;  Service: Endoscopy;  Laterality: N/A;  . FLEXIBLE SIGMOIDOSCOPY  01/22/2015      . FRACTURE SURGERY     fracture right elbow  . LEFT HEART CATH AND CORONARY ANGIOGRAPHY N/A 11/14/2016   Procedure: Left Heart Cath and Coronary Angiography;  Surgeon: Charolette Forward, MD;  Location: [place] [facility];  Service: Cardiovascular;  Laterality: N/A;  . LEFT HEART CATHETERIZATION WITH  CORONARY ANGIOGRAM N/A 06/05/2011   Procedure: LEFT HEART CATHETERIZATION WITH CORONARY ANGIOGRAM;  Surgeon: Clent Demark, MD;  Location: [facility] CATH LAB;  Service: Cardiovascular;  Laterality: N/A;  . LEFT HEART CATHETERIZATION WITH CORONARY ANGIOGRAM N/A 03/05/2014   Procedure: LEFT HEART CATHETERIZATION WITH CORONARY ANGIOGRAM;  Surgeon: Clent Demark, MD;  Location: [place] CATH LAB;  Service: Cardiovascular;  Laterality: N/A;  . LUMBAR LAMINECTOMY  2003  . MASTECTOMY  1987   Right  . MASTECTOMY    . MASTOIDECTOMY  childhood  . REDUCTION MAMMAPLASTY  1998   Left  . SHOULDER SURGERY  02/2005   Bilateral fractures with multiple surgeries    Family History  Problem Relation Age of Onset  . Heart attack Father 75  . Cancer Mother        ovarian  . Cancer Brother        lung  . Arthritis Unknown        family    Social History   Socioeconomic History  . Marital status: Widowed    Spouse name: Not on file  . Number of children: 2  . Years of education: Not on file  . Highest education level: Not on file  Social Needs  . Financial resource strain: Not on file  . Food insecurity - worry: Not on file  . Food insecurity - inability: Not on file  . Transportation needs - medical: Not on file  . Transportation needs - non-medical: Not on file  Occupational History  . 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 Smoking status:  Former Smoker    Last attempt to quit: 07/13/1974    Years since quitting: 43.0  . Smokeless tobacco: Never Used  Substance and Sexual Activity  . Alcohol use: Yes    Alcohol/week: 0.0 oz    Comment: occasional  . Drug use: No  . Sexual activity: No    Birth control/protection: Post-menopausal  Other Topics Concern  . Not on file  Social History Narrative   Has living will   Son or daughter would be health care POA.   Requests DNR--written 03/02/13   No tube feeds if cognitively unaware   Review of Systems Appetite is not good Trying boost/ensure supplements    Objective:   Physical Exam  Constitutional: No distress.  Cardiovascular: Normal rate, regular rhythm and normal heart sounds. Exam reveals no gallop.  No murmur heard. Pulmonary/Chest: Effort normal. No respiratory distress. She has no wheezes.  Slightly decreased breath sounds. Scant bibasilar crackles  Musculoskeletal:  1+ edema bilaterally  Skin:  No leg ulcerations          Assessment & Plan:

## 2017-07-30 DIAGNOSIS — Z9181 History of falling: Secondary | ICD-10-CM | POA: Diagnosis not present

## 2017-07-30 DIAGNOSIS — Z7982 Long term (current) use of aspirin: Secondary | ICD-10-CM | POA: Diagnosis not present

## 2017-07-30 DIAGNOSIS — G9009 Other idiopathic peripheral autonomic neuropathy: Secondary | ICD-10-CM | POA: Diagnosis not present

## 2017-07-30 DIAGNOSIS — G8929 Other chronic pain: Secondary | ICD-10-CM | POA: Diagnosis not present

## 2017-07-30 DIAGNOSIS — M1991 Primary osteoarthritis, unspecified site: Secondary | ICD-10-CM | POA: Diagnosis not present

## 2017-07-30 DIAGNOSIS — I509 Heart failure, unspecified: Secondary | ICD-10-CM | POA: Diagnosis not present

## 2017-08-03 DIAGNOSIS — I509 Heart failure, unspecified: Secondary | ICD-10-CM | POA: Diagnosis not present

## 2017-08-03 DIAGNOSIS — Z9181 History of falling: Secondary | ICD-10-CM | POA: Diagnosis not present

## 2017-08-03 DIAGNOSIS — M1991 Primary osteoarthritis, unspecified site: Secondary | ICD-10-CM | POA: Diagnosis not present

## 2017-08-03 DIAGNOSIS — G8929 Other chronic pain: Secondary | ICD-10-CM | POA: Diagnosis not present

## 2017-08-03 DIAGNOSIS — Z7982 Long term (current) use of aspirin: Secondary | ICD-10-CM | POA: Diagnosis not present

## 2017-08-03 DIAGNOSIS — G9009 Other idiopathic peripheral autonomic neuropathy: Secondary | ICD-10-CM | POA: Diagnosis not present

## 2017-08-04 DIAGNOSIS — Z7982 Long term (current) use of aspirin: Secondary | ICD-10-CM | POA: Diagnosis not present

## 2017-08-04 DIAGNOSIS — G9009 Other idiopathic peripheral autonomic neuropathy: Secondary | ICD-10-CM | POA: Diagnosis not present

## 2017-08-04 DIAGNOSIS — G8929 Other chronic pain: Secondary | ICD-10-CM | POA: Diagnosis not present

## 2017-08-04 DIAGNOSIS — M1991 Primary osteoarthritis, unspecified site: Secondary | ICD-10-CM | POA: Diagnosis not present

## 2017-08-04 DIAGNOSIS — I509 Heart failure, unspecified: Secondary | ICD-10-CM | POA: Diagnosis not present

## 2017-08-04 DIAGNOSIS — Z9181 History of falling: Secondary | ICD-10-CM | POA: Diagnosis not present

## 2017-08-05 DIAGNOSIS — Z7982 Long term (current) use of aspirin: Secondary | ICD-10-CM | POA: Diagnosis not present

## 2017-08-05 DIAGNOSIS — G8929 Other chronic pain: Secondary | ICD-10-CM | POA: Diagnosis not present

## 2017-08-05 DIAGNOSIS — G9009 Other idiopathic peripheral autonomic neuropathy: Secondary | ICD-10-CM | POA: Diagnosis not present

## 2017-08-05 DIAGNOSIS — I509 Heart failure, unspecified: Secondary | ICD-10-CM | POA: Diagnosis not present

## 2017-08-05 DIAGNOSIS — M1991 Primary osteoarthritis, unspecified site: Secondary | ICD-10-CM | POA: Diagnosis not present

## 2017-08-05 DIAGNOSIS — Z9181 History of falling: Secondary | ICD-10-CM | POA: Diagnosis not present

## 2017-08-09 ENCOUNTER — Telehealth: Payer: Self-pay | Admitting: Internal Medicine

## 2017-08-09 DIAGNOSIS — G8929 Other chronic pain: Secondary | ICD-10-CM | POA: Diagnosis not present

## 2017-08-09 DIAGNOSIS — Z7982 Long term (current) use of aspirin: Secondary | ICD-10-CM | POA: Diagnosis not present

## 2017-08-09 DIAGNOSIS — Z9181 History of falling: Secondary | ICD-10-CM | POA: Diagnosis not present

## 2017-08-09 DIAGNOSIS — M1991 Primary osteoarthritis, unspecified site: Secondary | ICD-10-CM | POA: Diagnosis not present

## 2017-08-09 DIAGNOSIS — I509 Heart failure, unspecified: Secondary | ICD-10-CM | POA: Diagnosis not present

## 2017-08-09 DIAGNOSIS — G9009 Other idiopathic peripheral autonomic neuropathy: Secondary | ICD-10-CM | POA: Diagnosis not present

## 2017-08-09 MED ORDER — DOXYCYCLINE HYCLATE 100 MG PO TABS: 100.0000 mg | ORAL_TABLET | Freq: Two times a day (BID) | ORAL | 0 refills | Status: DC

## 2017-08-09 NOTE — Telephone Encounter (Signed)
Copied from [place]. Topic: Quick Communication - See Telephone Encounter >> Aug 09, 2017 11:31 AM Ether Griffins B wrote: CRM for notification. See Telephone encounter for:  Home Health nurse is with pt now and she is having cellulitis in the right leg. She has an appt on 08/10/17 with Dr. Silvio Pate. The family is wanting to know if an antibiotic can go ahead and be called in the nurse is going to be sending in a report to the office as well  08/09/17.

## 2017-08-09 NOTE — Telephone Encounter (Signed)
Spoke to pt

## 2017-08-09 NOTE — Telephone Encounter (Signed)
Please let her know that I did send a prescription for antibiotic to start now and I will check it tomorrow at the appt

## 2017-08-10 ENCOUNTER — Ambulatory Visit: Payer: Medicare Other | Admitting: Internal Medicine

## 2017-08-10 ENCOUNTER — Encounter: Payer: Self-pay | Admitting: Internal Medicine

## 2017-08-10 ENCOUNTER — Telehealth: Payer: Self-pay | Admitting: Internal Medicine

## 2017-08-10 VITALS — BP 134/82 | Temp 98.6°F | Wt 179.5 lb

## 2017-08-10 DIAGNOSIS — I5042 Chronic combined systolic (congestive) and diastolic (congestive) heart failure: Secondary | ICD-10-CM

## 2017-08-10 DIAGNOSIS — G8929 Other chronic pain: Secondary | ICD-10-CM | POA: Diagnosis not present

## 2017-08-10 DIAGNOSIS — I872 Venous insufficiency (chronic) (peripheral): Secondary | ICD-10-CM | POA: Diagnosis not present

## 2017-08-10 DIAGNOSIS — M1991 Primary osteoarthritis, unspecified site: Secondary | ICD-10-CM | POA: Diagnosis not present

## 2017-08-10 DIAGNOSIS — G9009 Other idiopathic peripheral autonomic neuropathy: Secondary | ICD-10-CM | POA: Diagnosis not present

## 2017-08-10 DIAGNOSIS — Z7982 Long term (current) use of aspirin: Secondary | ICD-10-CM | POA: Diagnosis not present

## 2017-08-10 DIAGNOSIS — I509 Heart failure, unspecified: Secondary | ICD-10-CM | POA: Diagnosis not present

## 2017-08-10 DIAGNOSIS — Z9181 History of falling: Secondary | ICD-10-CM | POA: Diagnosis not present

## 2017-08-10 MED ORDER — FUROSEMIDE 80 MG PO TABS: 80.0000 mg | ORAL_TABLET | Freq: Every day | ORAL | 11 refills | Status: AC

## 2017-08-10 NOTE — Progress Notes (Signed)
Subjective:    Patient ID: Haley Harris, female    DOB: 03-13-1929, 84 y.o.   MRN: [id number]  HPI Here due to apparent infection in leg--with son Reviewed last note and call from home health nurse  Ongoing leg swelling with redness in lower areas Taking extra furosemide--- 80/40 Finally diuresed off 3#  Little blisters on right leg and increased redness Intermittent pain--like if rubbed with towel to dry No discharge  Home weight 177# this morning Considers 175-176 as baseline  Current Outpatient Medications on File Prior to Visit  Medication Sig Dispense Refill  . acetaminophen (TYLENOL) 500 MG tablet Take 1,000 mg by mouth every 8 (eight) hours as needed for mild pain.     Marland Kitchen albuterol (PROVENTIL) (2.5 MG/3ML) 0.083% nebulizer solution Take 3 mLs (2.5 mg total) by nebulization every 6 (six) hours as needed for wheezing or shortness of breath. 540 mL 3  . aspirin 81 MG tablet Take 81 mg by mouth every evening.     Marland Kitchen atorvastatin (LIPITOR) 40 MG tablet TAKE 1 TABLET BY MOUTH  DAILY AT 6 PM. 90 tablet 3  . Calcium Carbonate-Vitamin D (CALTRATE 600+D PO) Take 1 tablet by mouth daily after lunch.     . carvedilol (COREG) 6.25 MG tablet Take 1 tablet (6.25 mg total) by mouth 2 (two) times daily with a meal. Take 1 tablet by mouth two  times daily with a meal 60 tablet 3  . doxycycline (VIBRA-TABS) 100 MG tablet Take 1 tablet (100 mg total) by mouth 2 (two) times daily. 14 tablet 0  . escitalopram (LEXAPRO) 10 MG tablet TAKE 1 TABLET BY MOUTH  DAILY 90 tablet 3  . famotidine (PEPCID) 20 MG tablet TAKE 1 TABLET BY MOUTH TWO  TIMES DAILY 180 tablet 3  . fluticasone (FLONASE) 50 MCG/ACT nasal spray Use 2 sprays in each  nostril daily 48 g 3  . furosemide (LASIX) 40 MG tablet Take 2 tablets (80 mg total) by mouth daily. Take 1 pill daily at lunch if weight has elevated. 1 tablet 0  . gabapentin (NEURONTIN) 100 MG capsule TAKE 1 CAPSULE BY MOUTH TWO TIMES DAILY 180 capsule 3  . losartan  (COZAAR) 50 MG tablet TAKE 1 TABLET BY MOUTH  DAILY 90 tablet 3  . Melatonin 3 MG TABS Take 1 tablet by mouth at bedtime.     . montelukast (SINGULAIR) 10 MG tablet TAKE 1 TABLET BY MOUTH  DAILY 90 tablet 3  . Morphine Sulfate (MORPHINE CONCENTRATE) 10 mg / 0.5 ml concentrated solution Place 0.25-0.5 mLs (5-10 mg total) under the tongue every 2 (two) hours as needed for severe pain or shortness of breath. 30 mL 0  . Multiple Vitamin (MULITIVITAMIN WITH MINERALS) TABS Take 1 tablet by mouth daily after lunch.     . nitroGLYCERIN (NITROSTAT) 0.4 MG SL tablet Place 1 tablet (0.4 mg total) under the tongue every 5 (five) minutes x 3 doses as needed for chest pain. 25 tablet 2  . PROAIR HFA 108 (90 Base) MCG/ACT inhaler USE 2 PUFFS EVERY 6 HOURS AS NEEDED FOR WHEEZING OR SHORTNESS OF BREATH 8.5 g 5  . spironolactone (ALDACTONE) 25 MG tablet TAKE 1 TABLET BY MOUTH  DAILY 90 tablet 3  . vitamin B-12 (CYANOCOBALAMIN) 500 MCG tablet Take 1,000 mcg by mouth 3 (three) times a week. Mon, Wed, Fri     No current facility-administered medications on file prior to visit.     Allergies  Allergen Reactions  .  Amoxicillin-Pot Clavulanate Anaphylaxis  . Penicillins Anaphylaxis  . Cephalexin Other (See Comments)    Reaction unknown  . Clarithromycin Other (See Comments)    Reaction unknown  . Lidocaine     Patient had tremors following use of lidocaine pain patches  . Nifedipine Other (See Comments)    Reaction unknown  . Nsaids Other (See Comments)    Heart issue  . Olmesartan Medoxomil     REACTION: cough;  But tolerating Losartan 02/2014  . Tramadol Hcl Other (See Comments)    loopy    Past Medical History:  Diagnosis Date  . ACE-inhibitor cough   . Allergic rhinitis, cause unspecified   . Angina   . Anxiety   . CHF (congestive heart failure) ([place])   . Chronic airway obstruction, not elsewhere classified   . Compression fracture of T12 vertebra ([place]) 10/28/2011  . Depressive disorder, not  elsewhere classified   . Esophageal reflux   . Gout   . Heart murmur    aS CHILD  . Malignant neoplasm of breast (female), unspecified site   . Myocardial infarct ([place])   . Neuromuscular disorder ([place])    NEROPATHY FROM STENOSIS  . Occlusion and stenosis of carotid artery without mention of cerebral infarction   . Osteoarthrosis, unspecified whether generalized or localized, unspecified site   . Osteoporosis, unspecified   . Other and unspecified hyperlipidemia   . Other dyspnea and respiratory abnormality   . Oxygen dependent   . Peripheral vascular disease, unspecified ([place])   . Personal history of malignant neoplasm of breast   . Pneumonia   . Recurrent upper respiratory infection (URI)   . Shortness of breath   . Spinal stenosis   . Unspecified cardiovascular disease   . Unspecified essential hypertension   . Unspecified hearing loss   . Unspecified urinary incontinence     Past Surgical History:  Procedure Laterality Date  . ABDOMINAL HYSTERECTOMY    . APPENDECTOMY  1948  . BREAST IMPLANT REMOVAL  06/12/09   right  . BREAST RECONSTRUCTION  1998   Reconstruction   . CARDIAC CATHETERIZATION N/A 01/25/2015   Procedure: Left Heart Cath and Coronary Angiography;  Surgeon: Charolette Forward, MD;  Location: [place] [facility] LAB;  Service: Cardiovascular;  Laterality: N/A;  . CORONARY ANGIOPLASTY  11/12   distal RCA  . FLEXIBLE SIGMOIDOSCOPY N/A 01/21/2015   Procedure: FLEXIBLE SIGMOIDOSCOPY;  Surgeon: Richmond Campbell, MD;  Location[facility] ENDOSCOPY;  Service: Endoscopy;  Laterality: N/A;  . FLEXIBLE SIGMOIDOSCOPY  01/22/2015      . FRACTURE SURGERY     fracture right elbow  . LEFT HEART CATH AND CORONARY ANGIOGRAPHY N/A 11/14/2016   Procedure: Left Heart Cath and Coronary Angiography;  Surgeon: Charolette Forward, MD;  Location: [place] [facility] LAB;  Service: Cardiovascular;  Laterality: N/A;  . LEFT HEART CATHETERIZATION WITH CORONARY ANGIOGRAM N/A 06/05/2011   Procedure: LEFT HEART  CATHETERIZATION WITH CORONARY ANGIOGRAM;  Surgeon: Clent Demark, MD;  Location: [place] CATH LAB;  Service: Cardiovascular;  Laterality: N/A;  . LEFT HEART CATHETERIZATION WITH CORONARY ANGIOGRAM N/A 03/05/2014   Procedure: LEFT HEART CATHETERIZATION WITH CORONARY ANGIOGRAM;  Surgeon: Clent Demark, MD;  Location: [place] CATH LAB;  Service: Cardiovascular;  Laterality: N/A;  . LUMBAR LAMINECTOMY  2003  . MASTECTOMY  1987   Right  . MASTECTOMY    . MASTOIDECTOMY  childhood  . REDUCTION MAMMAPLASTY  1998   Left  . SHOULDER SURGERY  02/2005   Bilateral fractures with  multiple surgeries    Family History  Problem Relation Age of Onset  . Heart attack Father 34  . Cancer Mother        ovarian  . Cancer Brother        lung  . Arthritis Unknown        family    Social History   Socioeconomic History  . Marital status: Widowed    Spouse name: Not on file  . Number of children: 2  . Years of education: Not on file  . Highest education level: Not on file  Social Needs  . Financial resource strain: Not on file  . Food insecurity - worry: Not on file  . Food insecurity - inability: Not on file  . Transportation needs - medical: Not on file  . Transportation needs - non-medical: Not on file  Occupational History  . 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 Smoking status: Former Smoker    Last attempt to quit: 07/13/1974    Years since quitting: 43.1  . Smokeless tobacco: Never Used  Substance and Sexual Activity  . Alcohol use: Yes    Alcohol/week: 0.0 oz    Comment: occasional  . Drug use: No  . Sexual activity: No    Birth control/protection: Post-menopausal  Other Topics Concern  . Not on file  Social History Narrative   Has living will   Son or daughter would be health care POA.   Requests DNR--written 03/02/13   No tube feeds if cognitively unaware   Review of Systems No fever Some SOB--using the  breathing treatments at least bid    Objective:   Physical Exam  Constitutional: No distress.  Cardiovascular:  Distant but regular  Pulmonary/Chest: Effort normal and breath sounds normal. No respiratory distress. She has no wheezes. She has no rales.  Musculoskeletal:  1+ edema Some petechiae on upper right calf but no blisters Deep redness distal right calf--but not warm or inflamed          Assessment & Plan:

## 2017-08-10 NOTE — Assessment & Plan Note (Signed)
Ongoing edema Will change the furosemide to 80mg  daily and bid based on weight

## 2017-08-10 NOTE — Telephone Encounter (Signed)
Copied from [place]. Topic: Quick Communication - See Telephone Encounter >> Aug 10, 2017  3:34 PM Aurelio Brash B wrote: CRM for notification. See Telephone encounter for:  Kendrice, RN at [facility] called to inform the Dr that the PT took a fall last week 1/24 a she suffered a  bruise on chin and left forearm   08/10/17. Kendrice contact number is [phone]

## 2017-08-10 NOTE — Patient Instructions (Signed)
Take the doxycycline for 2 more days--then stop it.

## 2017-08-10 NOTE — Assessment & Plan Note (Signed)
Infection is not clear cut--may be better since the diuresis and 1 day of antibiotic (but I suspect mostly just dermatitis) Will have her take the doxy for 2 more days only

## 2017-08-11 NOTE — Telephone Encounter (Signed)
Okay I saw her yesterday and she didn't seem to have any sequelae from this

## 2017-08-12 DIAGNOSIS — G9009 Other idiopathic peripheral autonomic neuropathy: Secondary | ICD-10-CM | POA: Diagnosis not present

## 2017-08-12 DIAGNOSIS — Z9181 History of falling: Secondary | ICD-10-CM | POA: Diagnosis not present

## 2017-08-12 DIAGNOSIS — I509 Heart failure, unspecified: Secondary | ICD-10-CM | POA: Diagnosis not present

## 2017-08-12 DIAGNOSIS — M1991 Primary osteoarthritis, unspecified site: Secondary | ICD-10-CM | POA: Diagnosis not present

## 2017-08-12 DIAGNOSIS — Z7982 Long term (current) use of aspirin: Secondary | ICD-10-CM | POA: Diagnosis not present

## 2017-08-12 DIAGNOSIS — G8929 Other chronic pain: Secondary | ICD-10-CM | POA: Diagnosis not present

## 2017-08-13 ENCOUNTER — Ambulatory Visit: Payer: Self-pay | Admitting: *Deleted

## 2017-08-13 DIAGNOSIS — G8929 Other chronic pain: Secondary | ICD-10-CM | POA: Diagnosis not present

## 2017-08-13 DIAGNOSIS — Z7982 Long term (current) use of aspirin: Secondary | ICD-10-CM | POA: Diagnosis not present

## 2017-08-13 DIAGNOSIS — Z9181 History of falling: Secondary | ICD-10-CM | POA: Diagnosis not present

## 2017-08-13 DIAGNOSIS — M1991 Primary osteoarthritis, unspecified site: Secondary | ICD-10-CM | POA: Diagnosis not present

## 2017-08-13 DIAGNOSIS — G9009 Other idiopathic peripheral autonomic neuropathy: Secondary | ICD-10-CM | POA: Diagnosis not present

## 2017-08-13 DIAGNOSIS — I509 Heart failure, unspecified: Secondary | ICD-10-CM | POA: Diagnosis not present

## 2017-08-13 NOTE — Telephone Encounter (Signed)
She should definitely extend the doxycycline if there is inflammation in the leg or blisters. They may just be from fluid--so continue elevation,etc--and it really didn't look infected when I saw it

## 2017-08-13 NOTE — Telephone Encounter (Signed)
Spoke to [place] from [place] at [facility]. She will advise pt to continue the Doxy and to make sure she is elevating her legs

## 2017-08-13 NOTE — Telephone Encounter (Signed)
Haley Alexander, RN from [facility], called because the pt was seen by Dr [name] at [facility] on 08/10/17 and instructions were given,"Take the doxycycline for 2 more days--then stop it."; she further states that pt's right leg now has multiple fluid filled blisters, and she is calling for instructions; spoke with Rollene Fare at [facility] and she request this information be routed to the office for physician notification; Roseanne Kaufman can be contacted at [phone].   Answer Assessment - Initial Assessment Questions 1. REASON FOR CALL or QUESTION: "What is your reason for calling today?" or "How can I best help you?" or "What question do you have that I can help answer?"     Home Health RN, Haley Harris called because pt now has blisters on her right leg; seen in office on 08/10/17; should pt continue the medication?  Protocols used: INFORMATION ONLY CALL-A-AH

## 2017-08-17 DIAGNOSIS — I509 Heart failure, unspecified: Secondary | ICD-10-CM | POA: Diagnosis not present

## 2017-08-17 DIAGNOSIS — G9009 Other idiopathic peripheral autonomic neuropathy: Secondary | ICD-10-CM | POA: Diagnosis not present

## 2017-08-17 DIAGNOSIS — Z7982 Long term (current) use of aspirin: Secondary | ICD-10-CM | POA: Diagnosis not present

## 2017-08-17 DIAGNOSIS — Z9181 History of falling: Secondary | ICD-10-CM | POA: Diagnosis not present

## 2017-08-17 DIAGNOSIS — M1991 Primary osteoarthritis, unspecified site: Secondary | ICD-10-CM | POA: Diagnosis not present

## 2017-08-17 DIAGNOSIS — G8929 Other chronic pain: Secondary | ICD-10-CM | POA: Diagnosis not present

## 2017-08-19 ENCOUNTER — Ambulatory Visit: Payer: Self-pay

## 2017-08-19 DIAGNOSIS — M1991 Primary osteoarthritis, unspecified site: Secondary | ICD-10-CM | POA: Diagnosis not present

## 2017-08-19 DIAGNOSIS — G9009 Other idiopathic peripheral autonomic neuropathy: Secondary | ICD-10-CM | POA: Diagnosis not present

## 2017-08-19 DIAGNOSIS — I509 Heart failure, unspecified: Secondary | ICD-10-CM | POA: Diagnosis not present

## 2017-08-19 DIAGNOSIS — Z9181 History of falling: Secondary | ICD-10-CM | POA: Diagnosis not present

## 2017-08-19 DIAGNOSIS — Z7982 Long term (current) use of aspirin: Secondary | ICD-10-CM | POA: Diagnosis not present

## 2017-08-19 DIAGNOSIS — G8929 Other chronic pain: Secondary | ICD-10-CM | POA: Diagnosis not present

## 2017-08-19 NOTE — Telephone Encounter (Signed)
Probably needs to be checked again since it didn't look infected the last time I saw her and we might need to check culture (I have recently seen some doxy resistance MRSA)

## 2017-08-19 NOTE — Telephone Encounter (Signed)
Spoke to [place] at [place]. She will advice the pt that she will most likely need an OV.

## 2017-08-19 NOTE — Telephone Encounter (Signed)
Kendrice from [facility] called to report pt. Has finished her Doxycycline. She has 4-5 small blisters on right leg - measuring 0.2-0.3 cm. One looks reddened. Two smaller blisters to right leg. Temp 97.4 oral. Took an extra Lasix due to 2 lb. Weight gain. Please advise. May call Kendrice if other information is needed.[phone] or pt.

## 2017-08-20 ENCOUNTER — Telehealth: Payer: Self-pay | Admitting: Internal Medicine

## 2017-08-20 NOTE — Telephone Encounter (Signed)
Haley Harris  Can I work pt in next week  See below message Thanks  Copied from [place]. Topic: Appointment Scheduling - Scheduling Inquiry for Clinic >> Aug 20, 2017  2:10 PM Burnis Medin, NT wrote: Reason for CRM: Patient called and wanted to get an appointment to come for a follow up appointment for the doctor to look at her legs but no availability on the schedule. Pt wanted to see if the doctor could work her in one day next week. Pt does not want to see anyone but her PCP. Pls contact patient for scheduling.

## 2017-08-20 NOTE — Telephone Encounter (Signed)
Appointment 2/13 pt aware

## 2017-08-20 NOTE — Telephone Encounter (Signed)
He is not here until Tuesday, but go ahead and use a same day if we have one soon. Thanks

## 2017-08-23 ENCOUNTER — Telehealth: Payer: Self-pay | Admitting: Internal Medicine

## 2017-08-23 NOTE — Telephone Encounter (Signed)
Copied from [place]. Topic: Quick Communication - See Telephone Encounter >> Aug 23, 2017 11:12 AM Robina Ade, Helene Kelp D wrote: CRM for notification. See Telephone encounter for: 08/23/17. Gwenyth Ober from [facility] called and would like a verbal order from Dr. Silvio Pate for 2X for 3 weeks. She can be reached at [phone]. Please call her back, thanks.

## 2017-08-23 NOTE — Telephone Encounter (Signed)
Spoke to [place]. Verbal order given to her for Home Health 2x week for 3 weeks.

## 2017-08-23 NOTE — Telephone Encounter (Signed)
That is okay.

## 2017-08-24 DIAGNOSIS — G9009 Other idiopathic peripheral autonomic neuropathy: Secondary | ICD-10-CM | POA: Diagnosis not present

## 2017-08-24 DIAGNOSIS — G8929 Other chronic pain: Secondary | ICD-10-CM | POA: Diagnosis not present

## 2017-08-24 DIAGNOSIS — M1991 Primary osteoarthritis, unspecified site: Secondary | ICD-10-CM | POA: Diagnosis not present

## 2017-08-24 DIAGNOSIS — I509 Heart failure, unspecified: Secondary | ICD-10-CM | POA: Diagnosis not present

## 2017-08-24 DIAGNOSIS — Z7982 Long term (current) use of aspirin: Secondary | ICD-10-CM | POA: Diagnosis not present

## 2017-08-24 DIAGNOSIS — Z9181 History of falling: Secondary | ICD-10-CM | POA: Diagnosis not present

## 2017-08-25 ENCOUNTER — Encounter: Payer: Self-pay | Admitting: Internal Medicine

## 2017-08-25 ENCOUNTER — Ambulatory Visit: Payer: Medicare Other | Admitting: Internal Medicine

## 2017-08-25 VITALS — BP 122/88 | HR 70 | Temp 98.1°F | Wt 180.0 lb

## 2017-08-25 DIAGNOSIS — I5042 Chronic combined systolic (congestive) and diastolic (congestive) heart failure: Secondary | ICD-10-CM

## 2017-08-25 DIAGNOSIS — I5020 Unspecified systolic (congestive) heart failure: Secondary | ICD-10-CM | POA: Diagnosis not present

## 2017-08-25 DIAGNOSIS — I739 Peripheral vascular disease, unspecified: Secondary | ICD-10-CM

## 2017-08-25 DIAGNOSIS — I872 Venous insufficiency (chronic) (peripheral): Secondary | ICD-10-CM | POA: Diagnosis not present

## 2017-08-25 NOTE — Assessment & Plan Note (Signed)
No secondary infection now reassured

## 2017-08-25 NOTE — Assessment & Plan Note (Signed)
Chronic DOE and fatigue No exacerbation

## 2017-08-25 NOTE — Assessment & Plan Note (Signed)
Clearly decreased arterial circulation--but no ulcers or rest pain

## 2017-08-25 NOTE — Progress Notes (Signed)
Subjective:    Patient ID: Haley Harris, female    DOB: [birth year], 84 y.o.   MRN: [id number]  HPI Here for follow up of cellulitis in her legs With daughter  Calves still very swollen Some pain if they get rubbed---otherwise not Still having little blisters --not broken No fever  Using eucerin lotion on it after showers  Current Outpatient Medications on File Prior to Visit  Medication Sig Dispense Refill  . acetaminophen (TYLENOL) 500 MG tablet Take 1,000 mg by mouth every 8 (eight) hours as needed for mild pain.     Marland Kitchen aspirin 81 MG tablet Take 81 mg by mouth every evening.     Marland Kitchen atorvastatin (LIPITOR) 40 MG tablet TAKE 1 TABLET BY MOUTH  DAILY AT 6 PM. 90 tablet 3  . Calcium Carbonate-Vitamin D (CALTRATE 600+D PO) Take 1 tablet by mouth daily after lunch.     . carvedilol (COREG) 6.25 MG tablet Take 1 tablet (6.25 mg total) by mouth 2 (two) times daily with a meal. Take 1 tablet by mouth two  times daily with a meal 60 tablet 3  . escitalopram (LEXAPRO) 10 MG tablet TAKE 1 TABLET BY MOUTH  DAILY 90 tablet 3  . famotidine (PEPCID) 20 MG tablet TAKE 1 TABLET BY MOUTH TWO  TIMES DAILY 180 tablet 3  . fluticasone (FLONASE) 50 MCG/ACT nasal spray Use 2 sprays in each  nostril daily 48 g 3  . furosemide (LASIX) 80 MG tablet Take 1 tablet (80 mg total) by mouth daily. Add 2nd dose for weight 175# or above 60 tablet 11  . gabapentin (NEURONTIN) 100 MG capsule TAKE 1 CAPSULE BY MOUTH TWO TIMES DAILY 180 capsule 3  . losartan (COZAAR) 50 MG tablet TAKE 1 TABLET BY MOUTH  DAILY 90 tablet 3  . Melatonin 3 MG TABS Take 1 tablet by mouth at bedtime.     . montelukast (SINGULAIR) 10 MG tablet TAKE 1 TABLET BY MOUTH  DAILY 90 tablet 3  . Morphine Sulfate (MORPHINE CONCENTRATE) 10 mg / 0.5 ml concentrated solution Place 0.25-0.5 mLs (5-10 mg total) under the tongue every 2 (two) hours as needed for severe pain or shortness of breath. 30 mL 0  . Multiple Vitamin (MULITIVITAMIN WITH MINERALS) TABS  Take 1 tablet by mouth daily after lunch.     . nitroGLYCERIN (NITROSTAT) 0.4 MG SL tablet Place 1 tablet (0.4 mg total) under the tongue every 5 (five) minutes x 3 doses as needed for chest pain. 25 tablet 2  . PROAIR HFA 108 (90 Base) MCG/ACT inhaler USE 2 PUFFS EVERY 6 HOURS AS NEEDED FOR WHEEZING OR SHORTNESS OF BREATH 8.5 g 5  . spironolactone (ALDACTONE) 25 MG tablet TAKE 1 TABLET BY MOUTH  DAILY 90 tablet 3  . vitamin B-12 (CYANOCOBALAMIN) 500 MCG tablet Take 1,000 mcg by mouth 3 (three) times a week. Mon, Wed, Fri    . albuterol (PROVENTIL) (2.5 MG/3ML) 0.083% nebulizer solution Take 3 mLs (2.5 mg total) by nebulization every 6 (six) hours as needed for wheezing or shortness of breath. 540 mL 3   No current facility-administered medications on file prior to visit.     Allergies  Allergen Reactions  . Amoxicillin-Pot Clavulanate Anaphylaxis  . Penicillins Anaphylaxis  . Cephalexin Other (See Comments)    Reaction unknown  . Clarithromycin Other (See Comments)    Reaction unknown  . Lidocaine     Patient had tremors following use of lidocaine pain patches  . Nifedipine  Other (See Comments)    Reaction unknown  . Nsaids Other (See Comments)    Heart issue  . Olmesartan Medoxomil     REACTION: cough;  But tolerating Losartan 02/2014  . Tramadol Hcl Other (See Comments)    loopy    Past Medical History:  Diagnosis Date  . ACE-inhibitor cough   . Allergic rhinitis, cause unspecified   . Angina   . Anxiety   . CHF (congestive heart failure) ([place])   . Chronic airway obstruction, not elsewhere classified   . Compression fracture of T12 vertebra ([place]) 10/28/2011  . Depressive disorder, not elsewhere classified   . Esophageal reflux   . Gout   . Heart murmur    aS CHILD  . Malignant neoplasm of breast (female), unspecified site   . Myocardial infarct ([place])   . Neuromuscular disorder ([place])    NEROPATHY FROM STENOSIS  . Occlusion and stenosis of carotid artery without mention  of cerebral infarction   . Osteoarthrosis, unspecified whether generalized or localized, unspecified site   . Osteoporosis, unspecified   . Other and unspecified hyperlipidemia   . Other dyspnea and respiratory abnormality   . Oxygen dependent   . Peripheral vascular disease, unspecified ([place])   . Personal history of malignant neoplasm of breast   . Pneumonia   . Recurrent upper respiratory infection (URI)   . Shortness of breath   . Spinal stenosis   . Unspecified cardiovascular disease   . Unspecified essential hypertension   . Unspecified hearing loss   . Unspecified urinary incontinence     Past Surgical History:  Procedure Laterality Date  . ABDOMINAL HYSTERECTOMY    . APPENDECTOMY  1948  . BREAST IMPLANT REMOVAL  06/12/09   right  . BREAST RECONSTRUCTION  1998   Reconstruction   . CARDIAC CATHETERIZATION N/A 01/25/2015   Procedure: Left Heart Cath and Coronary Angiography;  Surgeon: Charolette Forward, MD;  Location: [facility];  Service: Cardiovascular;  Laterality: N/A;  . CORONARY ANGIOPLASTY  11/12   distal RCA  . FLEXIBLE SIGMOIDOSCOPY N/A 01/21/2015   Procedure: FLEXIBLE SIGMOIDOSCOPY;  Surgeon: Richmond Campbell, MD;  Location: [facility] ENDOSCOPY;  Service: Endoscopy;  Laterality: N/A;  . FLEXIBLE SIGMOIDOSCOPY  01/22/2015      . FRACTURE SURGERY     fracture right elbow  . LEFT HEART CATH AND CORONARY ANGIOGRAPHY N/A 11/14/2016   Procedure: Left Heart Cath and Coronary Angiography;  Surgeon: Charolette Forward, MD;  Location: [facility];  Service: Cardiovascular;  Laterality: N/A;  . LEFT HEART CATHETERIZATION WITH CORONARY ANGIOGRAM N/A 06/05/2011   Procedure: LEFT HEART CATHETERIZATION WITH CORONARY ANGIOGRAM;  Surgeon: Clent Demark, MD;  Location: [facility] CATH LAB;  Service: Cardiovascular;  Laterality: N/A;  . LEFT HEART CATHETERIZATION WITH CORONARY ANGIOGRAM N/A 03/05/2014   Procedure: LEFT HEART CATHETERIZATION WITH CORONARY ANGIOGRAM;  Surgeon: Clent Demark, MD;   Location: [facility] C[facility] LAB;  Service: Cardiovascular;  Laterality: N/A;  . LUMBAR LAMINECTOMY  2003  . MASTECTOMY  1987   Right  . MASTECTOMY    . MASTOIDECTOMY  childhood  . REDUCTION MAMMAPLASTY  1998   Left  . SHOULDER SURGERY  02/2005   Bilateral fractures with multiple surgeries    Family History  Problem Relation Age of Onset  . Heart attack Father 23  . Cancer Mother        ovarian  . Cancer Brother        lung  . Arthritis Unknown  family    Social History   Socioeconomic History  . Marital status: Widowed    Spouse name: Not on file  . Number of children: 2  . Years of education: Not on file  . Highest education level: Not on file  Social Needs  . Financial resource strain: Not on file  . Food insecurity - worry: Not on file  . Food insecurity - inability: Not on file  . Transportation needs - medical: Not on file  . Transportation needs - non-medical: Not on file  Occupational History  . 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 Smoking status: Former Smoker    Last attempt to quit: 07/13/1974    Years since quitting: 43.1  . Smokeless tobacco: Never Used  Substance and Sexual Activity  . Alcohol use: Yes    Alcohol/week: 0.0 oz    Comment: occasional  . Drug use: No  . Sexual activity: No    Birth control/protection: Post-menopausal  Other Topics Concern  . Not on file  Social History Narrative   Has living will   Son or daughter would be health care POA.   Requests DNR--written 03/02/13   No tube feeds if cognitively unaware   Review of Systems Still not feeling right No appetite Ongoing DOE     Objective:   Physical Exam  Cardiovascular: Normal rate, regular rhythm and normal heart sounds. Exam reveals no gallop.  No murmur heard. Pulmonary/Chest: Effort normal. No respiratory distress. She has no wheezes. She has no rales.  Decreased breath sounds but clear    Musculoskeletal:  1+ edema bilaterally--more on right Purplish discoloration on left with decreased capillary refill  Skin:  Some mild stasis changes on right calf---- several whitish papules vs blisters----but not at all inflamed (clearly not infectious)          Assessment & Plan:

## 2017-08-26 DIAGNOSIS — M1991 Primary osteoarthritis, unspecified site: Secondary | ICD-10-CM | POA: Diagnosis not present

## 2017-08-26 DIAGNOSIS — Z9181 History of falling: Secondary | ICD-10-CM | POA: Diagnosis not present

## 2017-08-26 DIAGNOSIS — G9009 Other idiopathic peripheral autonomic neuropathy: Secondary | ICD-10-CM | POA: Diagnosis not present

## 2017-08-26 DIAGNOSIS — G8929 Other chronic pain: Secondary | ICD-10-CM | POA: Diagnosis not present

## 2017-08-26 DIAGNOSIS — Z7982 Long term (current) use of aspirin: Secondary | ICD-10-CM | POA: Diagnosis not present

## 2017-08-26 DIAGNOSIS — I509 Heart failure, unspecified: Secondary | ICD-10-CM | POA: Diagnosis not present

## 2017-08-31 DIAGNOSIS — I509 Heart failure, unspecified: Secondary | ICD-10-CM | POA: Diagnosis not present

## 2017-08-31 DIAGNOSIS — G9009 Other idiopathic peripheral autonomic neuropathy: Secondary | ICD-10-CM | POA: Diagnosis not present

## 2017-08-31 DIAGNOSIS — Z9181 History of falling: Secondary | ICD-10-CM | POA: Diagnosis not present

## 2017-08-31 DIAGNOSIS — G8929 Other chronic pain: Secondary | ICD-10-CM | POA: Diagnosis not present

## 2017-08-31 DIAGNOSIS — Z7982 Long term (current) use of aspirin: Secondary | ICD-10-CM | POA: Diagnosis not present

## 2017-08-31 DIAGNOSIS — M1991 Primary osteoarthritis, unspecified site: Secondary | ICD-10-CM | POA: Diagnosis not present

## 2017-09-01 ENCOUNTER — Telehealth: Payer: Self-pay | Admitting: Internal Medicine

## 2017-09-01 NOTE — Telephone Encounter (Signed)
Verbal orders given to Kendra.  

## 2017-09-01 NOTE — Telephone Encounter (Signed)
Copied from [place] [id number]. Topic: Quick Communication - See Telephone Encounter >> Sep 01, 2017  9:46 AM Arletha Grippe wrote: CRM for notification. See Telephone encounter for:   09/01/17.[facility]  Vincent Gros) called to get home health aide for baths 2 week 2  Cb# [phone]

## 2017-09-01 NOTE — Telephone Encounter (Signed)
That is fine 

## 2017-09-02 DIAGNOSIS — G8929 Other chronic pain: Secondary | ICD-10-CM | POA: Diagnosis not present

## 2017-09-02 DIAGNOSIS — Z7982 Long term (current) use of aspirin: Secondary | ICD-10-CM | POA: Diagnosis not present

## 2017-09-02 DIAGNOSIS — Z9181 History of falling: Secondary | ICD-10-CM | POA: Diagnosis not present

## 2017-09-02 DIAGNOSIS — I509 Heart failure, unspecified: Secondary | ICD-10-CM | POA: Diagnosis not present

## 2017-09-02 DIAGNOSIS — M1991 Primary osteoarthritis, unspecified site: Secondary | ICD-10-CM | POA: Diagnosis not present

## 2017-09-02 DIAGNOSIS — G9009 Other idiopathic peripheral autonomic neuropathy: Secondary | ICD-10-CM | POA: Diagnosis not present

## 2017-09-03 DIAGNOSIS — M1991 Primary osteoarthritis, unspecified site: Secondary | ICD-10-CM | POA: Diagnosis not present

## 2017-09-03 DIAGNOSIS — Z7982 Long term (current) use of aspirin: Secondary | ICD-10-CM | POA: Diagnosis not present

## 2017-09-03 DIAGNOSIS — Z0279 Encounter for issue of other medical certificate: Secondary | ICD-10-CM | POA: Diagnosis not present

## 2017-09-03 DIAGNOSIS — G9009 Other idiopathic peripheral autonomic neuropathy: Secondary | ICD-10-CM | POA: Diagnosis not present

## 2017-09-03 DIAGNOSIS — G8929 Other chronic pain: Secondary | ICD-10-CM | POA: Diagnosis not present

## 2017-09-03 DIAGNOSIS — Z9181 History of falling: Secondary | ICD-10-CM | POA: Diagnosis not present

## 2017-09-06 DIAGNOSIS — I509 Heart failure, unspecified: Secondary | ICD-10-CM | POA: Diagnosis not present

## 2017-09-06 DIAGNOSIS — G9009 Other idiopathic peripheral autonomic neuropathy: Secondary | ICD-10-CM | POA: Diagnosis not present

## 2017-09-06 DIAGNOSIS — Z7982 Long term (current) use of aspirin: Secondary | ICD-10-CM | POA: Diagnosis not present

## 2017-09-06 DIAGNOSIS — M1991 Primary osteoarthritis, unspecified site: Secondary | ICD-10-CM | POA: Diagnosis not present

## 2017-09-06 DIAGNOSIS — Z9181 History of falling: Secondary | ICD-10-CM | POA: Diagnosis not present

## 2017-09-06 DIAGNOSIS — G8929 Other chronic pain: Secondary | ICD-10-CM | POA: Diagnosis not present

## 2017-09-07 DIAGNOSIS — G8929 Other chronic pain: Secondary | ICD-10-CM | POA: Diagnosis not present

## 2017-09-07 DIAGNOSIS — Z7982 Long term (current) use of aspirin: Secondary | ICD-10-CM | POA: Diagnosis not present

## 2017-09-07 DIAGNOSIS — I509 Heart failure, unspecified: Secondary | ICD-10-CM | POA: Diagnosis not present

## 2017-09-07 DIAGNOSIS — M1991 Primary osteoarthritis, unspecified site: Secondary | ICD-10-CM | POA: Diagnosis not present

## 2017-09-07 DIAGNOSIS — Z9181 History of falling: Secondary | ICD-10-CM | POA: Diagnosis not present

## 2017-09-07 DIAGNOSIS — G9009 Other idiopathic peripheral autonomic neuropathy: Secondary | ICD-10-CM | POA: Diagnosis not present

## 2017-09-09 DIAGNOSIS — I509 Heart failure, unspecified: Secondary | ICD-10-CM | POA: Diagnosis not present

## 2017-09-09 DIAGNOSIS — Z7982 Long term (current) use of aspirin: Secondary | ICD-10-CM | POA: Diagnosis not present

## 2017-09-09 DIAGNOSIS — G8929 Other chronic pain: Secondary | ICD-10-CM | POA: Diagnosis not present

## 2017-09-09 DIAGNOSIS — Z9181 History of falling: Secondary | ICD-10-CM | POA: Diagnosis not present

## 2017-09-09 DIAGNOSIS — M1991 Primary osteoarthritis, unspecified site: Secondary | ICD-10-CM | POA: Diagnosis not present

## 2017-09-09 DIAGNOSIS — G9009 Other idiopathic peripheral autonomic neuropathy: Secondary | ICD-10-CM | POA: Diagnosis not present

## 2017-09-10 ENCOUNTER — Encounter: Payer: Self-pay | Admitting: Internal Medicine

## 2017-09-10 ENCOUNTER — Encounter: Payer: Medicare Other | Admitting: Internal Medicine

## 2017-09-10 ENCOUNTER — Telehealth: Payer: Self-pay

## 2017-09-10 ENCOUNTER — Ambulatory Visit (INDEPENDENT_AMBULATORY_CARE_PROVIDER_SITE_OTHER): Payer: Medicare Other | Admitting: Internal Medicine

## 2017-09-10 VITALS — BP 136/76 | HR 97 | Temp 98.3°F | Ht 62.0 in | Wt 181.0 lb

## 2017-09-10 DIAGNOSIS — I25119 Atherosclerotic heart disease of native coronary artery with unspecified angina pectoris: Secondary | ICD-10-CM

## 2017-09-10 DIAGNOSIS — N183 Chronic kidney disease, stage 3 unspecified: Secondary | ICD-10-CM

## 2017-09-10 DIAGNOSIS — E278 Other specified disorders of adrenal gland: Secondary | ICD-10-CM

## 2017-09-10 DIAGNOSIS — F39 Unspecified mood [affective] disorder: Secondary | ICD-10-CM

## 2017-09-10 DIAGNOSIS — G63 Polyneuropathy in diseases classified elsewhere: Secondary | ICD-10-CM | POA: Diagnosis not present

## 2017-09-10 DIAGNOSIS — Z9181 History of falling: Secondary | ICD-10-CM | POA: Diagnosis not present

## 2017-09-10 DIAGNOSIS — L97911 Non-pressure chronic ulcer of unspecified part of right lower leg limited to breakdown of skin: Secondary | ICD-10-CM | POA: Diagnosis not present

## 2017-09-10 DIAGNOSIS — G8929 Other chronic pain: Secondary | ICD-10-CM | POA: Diagnosis not present

## 2017-09-10 DIAGNOSIS — I5042 Chronic combined systolic (congestive) and diastolic (congestive) heart failure: Secondary | ICD-10-CM | POA: Diagnosis not present

## 2017-09-10 DIAGNOSIS — J9611 Chronic respiratory failure with hypoxia: Secondary | ICD-10-CM

## 2017-09-10 DIAGNOSIS — G9009 Other idiopathic peripheral autonomic neuropathy: Secondary | ICD-10-CM | POA: Diagnosis not present

## 2017-09-10 DIAGNOSIS — E279 Disorder of adrenal gland, unspecified: Secondary | ICD-10-CM | POA: Diagnosis not present

## 2017-09-10 DIAGNOSIS — I509 Heart failure, unspecified: Secondary | ICD-10-CM | POA: Diagnosis not present

## 2017-09-10 DIAGNOSIS — I739 Peripheral vascular disease, unspecified: Secondary | ICD-10-CM

## 2017-09-10 DIAGNOSIS — M1991 Primary osteoarthritis, unspecified site: Secondary | ICD-10-CM | POA: Diagnosis not present

## 2017-09-10 DIAGNOSIS — Z7982 Long term (current) use of aspirin: Secondary | ICD-10-CM | POA: Diagnosis not present

## 2017-09-10 DIAGNOSIS — J449 Chronic obstructive pulmonary disease, unspecified: Secondary | ICD-10-CM

## 2017-09-10 DIAGNOSIS — Z7189 Other specified counseling: Secondary | ICD-10-CM

## 2017-09-10 DIAGNOSIS — Z0000 Encounter for general adult medical examination without abnormal findings: Secondary | ICD-10-CM

## 2017-09-10 DIAGNOSIS — L97909 Non-pressure chronic ulcer of unspecified part of unspecified lower leg with unspecified severity: Secondary | ICD-10-CM | POA: Insufficient documentation

## 2017-09-10 LAB — COMPREHENSIVE METABOLIC PANEL
ALBUMIN: 3.6 g/dL (ref 3.5–5.2)
ALK PHOS: 75 U/L (ref 39–117)
ALT: 17 U/L (ref 0–35)
AST: 24 U/L (ref 0–37)
BUN: 32 mg/dL — ABNORMAL HIGH (ref 6–23)
CHLORIDE: 84 meq/L — AB (ref 96–112)
CO2: 37 mEq/L — ABNORMAL HIGH (ref 19–32)
Calcium: 9.7 mg/dL (ref 8.4–10.5)
Creatinine, Ser: 1.42 mg/dL — ABNORMAL HIGH (ref 0.40–1.20)
GFR: 37.24 mL/min — AB (ref >60.00)
Glucose, Bld: 93 mg/dL (ref 70–99)
POTASSIUM: 4.5 meq/L (ref 3.5–5.1)
Sodium: 128 mEq/L — ABNORMAL LOW (ref 135–145)
TOTAL PROTEIN: 6.6 g/dL (ref 6.0–8.3)
Total Bilirubin: 0.7 mg/dL (ref 0.2–1.2)

## 2017-09-10 LAB — LIPID PANEL
CHOLESTEROL: 148 mg/dL (ref 0–200)
HDL: 95 mg/dL (ref >39.00)
LDL CALC: 42 mg/dL (ref 0–99)
NonHDL: 52.99
Total CHOL/HDL Ratio: 2
Triglycerides: 57 mg/dL (ref 0.0–149.0)
VLDL: 11.4 mg/dL (ref 0.0–40.0)

## 2017-09-10 LAB — CBC
HCT: 31.2 % — ABNORMAL LOW (ref 36.0–46.0)
HEMOGLOBIN: 10.8 g/dL — AB (ref 12.0–15.0)
MCHC: 34.7 g/dL (ref 30.0–36.0)
MCV: 101 fl — ABNORMAL HIGH (ref 78.0–100.0)
PLATELETS: 239 10*3/uL (ref 150.0–400.0)
RBC: 3.08 Mil/uL — ABNORMAL LOW (ref 3.87–5.11)
RDW: 13.6 % (ref 11.5–15.5)
WBC: 5.7 10*3/uL (ref 4.0–10.5)

## 2017-09-10 NOTE — Assessment & Plan Note (Signed)
Will recheck labs On ARB

## 2017-09-10 NOTE — Assessment & Plan Note (Signed)
Will hold off on any further evaluation

## 2017-09-10 NOTE — Assessment & Plan Note (Signed)
Likely vascular--and needs to be carefull not to rub that area Discussed iodine dressings

## 2017-09-10 NOTE — Telephone Encounter (Signed)
Verbal orders left on VM for Kristin.

## 2017-09-10 NOTE — Progress Notes (Signed)
Subjective:    Patient ID: Haley Harris, female    DOB: 01-Mar-1929, 84 y.o.   MRN: [id number]  HPI Here with daughter for Medicare wellness and follow up of chronic health conditions Reviewed form and advanced directives Reviewed other doctors No tobacco Enjoys a glass of wine or scotch Not able to exercise 1 fall with some injury---tries to be careful (has alert button) Needs help with shower (aide). Bathroom alone (depends for urinary leakage). Doesn't drive or do housework Daughter notices mild memory problems Vision is okay Hearing is poor--does have hearing aides  Concerned about a sore on right heel Home care RN wants it checked again Ongoing problems with swollen legs Furosemide bid when weight is up On spironolactone Breathing "isn't too bad" Gets shaky going to the bathroom---using rollator Oxygen all the time No chest pain--but easy DOE  Mild depression  Affected by the CHF and fluid despite her medications Continues on the lexapro Some anxiety  Ongoing back pain--constant Will be going back to the ortho this month--may want to try more shots No feeling in feet still--but has pain in the heel  No dizziness or syncope No palpitations  She states only a tickle cough Daughter notes it fairly regular Dry   Adrenal mass for years No obvious endocrine abnormalities  Current Outpatient Medications on File Prior to Visit  Medication Sig Dispense Refill  . acetaminophen (TYLENOL) 500 MG tablet Take 1,000 mg by mouth every 8 (eight) hours as needed for mild pain.     Marland Kitchen albuterol (PROVENTIL) (2.5 MG/3ML) 0.083% nebulizer solution Take 3 mLs (2.5 mg total) by nebulization every 6 (six) hours as needed for wheezing or shortness of breath. 540 mL 3  . aspirin 81 MG tablet Take 81 mg by mouth every evening.     Marland Kitchen atorvastatin (LIPITOR) 40 MG tablet TAKE 1 TABLET BY MOUTH  DAILY AT 6 PM. 90 tablet 3  . Calcium Carbonate-Vitamin D (CALTRATE 600+D PO) Take 1 tablet by  mouth daily after lunch.     . carvedilol (COREG) 6.25 MG tablet Take 1 tablet (6.25 mg total) by mouth 2 (two) times daily with a meal. Take 1 tablet by mouth two  times daily with a meal 60 tablet 3  . escitalopram (LEXAPRO) 10 MG tablet TAKE 1 TABLET BY MOUTH  DAILY 90 tablet 3  . famotidine (PEPCID) 20 MG tablet TAKE 1 TABLET BY MOUTH TWO  TIMES DAILY 180 tablet 3  . fluticasone (FLONASE) 50 MCG/ACT nasal spray Use 2 sprays in each  nostril daily 48 g 3  . furosemide (LASIX) 80 MG tablet Take 1 tablet (80 mg total) by mouth daily. Add 2nd dose for weight 175# or above 60 tablet 11  . gabapentin (NEURONTIN) 100 MG capsule TAKE 1 CAPSULE BY MOUTH TWO TIMES DAILY 180 capsule 3  . losartan (COZAAR) 50 MG tablet TAKE 1 TABLET BY MOUTH  DAILY 90 tablet 3  . Melatonin 3 MG TABS Take 1 tablet by mouth at bedtime.     . montelukast (SINGULAIR) 10 MG tablet TAKE 1 TABLET BY MOUTH  DAILY 90 tablet 3  . Morphine Sulfate (MORPHINE CONCENTRATE) 10 mg / 0.5 ml concentrated solution Place 0.25-0.5 mLs (5-10 mg total) under the tongue every 2 (two) hours as needed for severe pain or shortness of breath. 30 mL 0  . Multiple Vitamin (MULITIVITAMIN WITH MINERALS) TABS Take 1 tablet by mouth daily after lunch.     . nitroGLYCERIN (NITROSTAT) 0.4 MG  SL tablet Place 1 tablet (0.4 mg total) under the tongue every 5 (five) minutes x 3 doses as needed for chest pain. 25 tablet 2  . PROAIR HFA 108 (90 Base) MCG/ACT inhaler USE 2 PUFFS EVERY 6 HOURS AS NEEDED FOR WHEEZING OR SHORTNESS OF BREATH 8.5 g 5  . spironolactone (ALDACTONE) 25 MG tablet TAKE 1 TABLET BY MOUTH  DAILY 90 tablet 3  . vitamin B-12 (CYANOCOBALAMIN) 500 MCG tablet Take 1,000 mcg by mouth 3 (three) times a week. Mon, Wed, Fri     No current facility-administered medications on file prior to visit.     Allergies  Allergen Reactions  . Amoxicillin-Pot Clavulanate Anaphylaxis  . Penicillins Anaphylaxis  . Cephalexin Other (See Comments)    Reaction  unknown  . Clarithromycin Other (See Comments)    Reaction unknown  . Lidocaine     Patient had tremors following use of lidocaine pain patches  . Nifedipine Other (See Comments)    Reaction unknown  . Nsaids Other (See Comments)    Heart issue  . Olmesartan Medoxomil     REACTION: cough;  But tolerating Losartan 02/2014  . Tramadol Hcl Other (See Comments)    loopy    Past Medical History:  Diagnosis Date  . ACE-inhibitor cough   . Allergic rhinitis, cause unspecified   . Angina   . Anxiety   . CHF (congestive heart failure) ([place])   . Chronic airway obstruction, not elsewhere classified   . Compression fracture of T12 vertebra ([place]) 10/28/2011  . Depressive disorder, not elsewhere classified   . Esophageal reflux   . Gout   . Heart murmur    aS CHILD  . Malignant neoplasm of breast (female), unspecified site   . Myocardial infarct ([place])   . Neuromuscular disorder ([place])    NEROPATHY FROM STENOSIS  . Occlusion and stenosis of carotid artery without mention of cerebral infarction   . Osteoarthrosis, unspecified whether generalized or localized, unspecified site   . Osteoporosis, unspecified   . Other and unspecified hyperlipidemia   . Other dyspnea and respiratory abnormality   . Oxygen dependent   . Peripheral vascular disease, unspecified ([place])   . Personal history of malignant neoplasm of breast   . Pneumonia   . Recurrent upper respiratory infection (URI)   . Shortness of breath   . Spinal stenosis   . Unspecified cardiovascular disease   . Unspecified essential hypertension   . Unspecified hearing loss   . Unspecified urinary incontinence     Past Surgical History:  Procedure Laterality Date  . ABDOMINAL HYSTERECTOMY    . APPENDECTOMY  1948  . BREAST IMPLANT REMOVAL  06/12/09   right  . BREAST RECONSTRUCTION  1998   Reconstruction   . CARDIAC CATHETERIZATION N/A 01/25/2015   Procedure: Left Heart Cath and Coronary Angiography;  Surgeon: Charolette Forward, MD;   Location: [facility];  Service: Cardiovascular;  Laterality: N/A;  . CORONARY ANGIOPLASTY  11/12   distal RCA  . FLEXIBLE SIGMOIDOSCOPY N/A 01/21/2015   Procedure: FLEXIBLE SIGMOIDOSCOPY;  Surgeon: Richmond Campbell, MD;  Location: [facility] ENDOSCOPY;  Service: Endoscopy;  Laterality: N/A;  . FLEXIBLE SIGMOIDOSCOPY  01/22/2015      . FRACTURE SURGERY     fracture right elbow  . LEFT HEART CATH AND CORONARY ANGIOGRAPHY N/A 11/14/2016   Procedure: Left Heart Cath and Coronary Angiography;  Surgeon: Charolette Forward, MD;  Location: [facility];  Service: Cardiovascular;  Laterality: N/A;  . LEFT  HEART CATHETERIZATION WITH CORONARY ANGIOGRAM N/A 06/05/2011   Procedure: LEFT HEART CATHETERIZATION WITH CORONARY ANGIOGRAM;  Surgeon: Clent Demark, MD;  Location: [facility] C[facility] LAB;  Service: Cardiovascular;  Laterality: N/A;  . LEFT HEART CATHETERIZATION WITH CORONARY ANGIOGRAM N/A 03/05/2014   Procedure: LEFT HEART CATHETERIZATION WITH CORONARY ANGIOGRAM;  Surgeon: Clent Demark, MD;  Location: [facility] CATH LAB;  Service: Cardiovascular;  Laterality: N/A;  . LUMBAR LAMINECTOMY  2003  . MASTECTOMY  1987   Right  . MASTECTOMY    . MASTOIDECTOMY  childhood  . REDUCTION MAMMAPLASTY  1998   Left  . SHOULDER SURGERY  02/2005   Bilateral fractures with multiple surgeries    Family History  Problem Relation Age of Onset  . Heart attack Father 79  . Cancer Mother        ovarian  . Cancer Brother        lung  . Arthritis Unknown        family    Social History   Socioeconomic History  . Marital status: Widowed    Spouse name: Not on file  . Number of children: 2  . Years of education: Not on file  . Highest education level: Not on file  Social Needs  . Financial resource strain: Not on file  . Food insecurity - worry: Not on file  . Food insecurity - inability: Not on file  . Transportation needs - medical: Not on file  . Transportation needs - non-medical: Not on file  Occupational History    . 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 Smoking status: Former Smoker    Last attempt to quit: 07/13/1974    Years since quitting: 43.1  . Smokeless tobacco: Never Used  Substance and Sexual Activity  . Alcohol use: Yes    Alcohol/week: 0.0 oz    Comment: occasional  . Drug use: No  . Sexual activity: No    Birth control/protection: Post-menopausal  Other Topics Concern  . Not on file  Social History Narrative   Has living will   Son or daughter would be health care POA.   Requests DNR--written 03/02/13   No tube feeds if cognitively unaware   Review of Systems Sleeps in recliner---but getting a hospital bed soon Appetite is fair--will eat when daughter puts food in front of her Bowels are moving well--no blood No heartburn or dysphagia (unless she takes too many pills at a time) Teeth are okay--hasn't seen dentist (discussed) Wears seat belt Pain in knees and hips also No skin issues other than the heel. No dermatologist    Objective:   Physical Exam  Constitutional: She is oriented to person, place, and time. No distress.  HENT:  Mouth/Throat: Oropharynx is clear and moist. No oropharyngeal exudate.  Neck: No thyromegaly present.  Cardiovascular: Normal rate, regular rhythm and normal heart sounds. Exam reveals no gallop.  No murmur heard. No distal pulses Dependent rubor  Pulmonary/Chest: Effort normal. No respiratory distress. She has no wheezes. She has no rales.  Muffled at bases but no dullness  Lymphadenopathy:    She has no cervical adenopathy.  Neurological: She is alert and oriented to person, place, and time.  President--- "Vivianne Spence Bush----Obama" [phone] [id number] Recall 3/3  Skin:  91mm non inflamed ulcer over upper right Achilles tendon  Psychiatric: She has a normal mood and affect. Her behavior is normal.  Assessment & Plan:

## 2017-09-10 NOTE — Assessment & Plan Note (Addendum)
Regular cough Probably contributes to the DOE Uses the nebulizer prn

## 2017-09-10 NOTE — Assessment & Plan Note (Signed)
Very limited functional status May need to increase diuretics

## 2017-09-10 NOTE — Progress Notes (Signed)
Hearing Screening Comments: April 2018 Vision Screening Comments: 2017

## 2017-09-10 NOTE — Assessment & Plan Note (Signed)
Numbness persists but no sig pain

## 2017-09-10 NOTE — Assessment & Plan Note (Signed)
Uses O2 24/7

## 2017-09-10 NOTE — Telephone Encounter (Signed)
That is appropriate 

## 2017-09-10 NOTE — Assessment & Plan Note (Signed)
Stable DOE No changes needed

## 2017-09-10 NOTE — Assessment & Plan Note (Signed)
Chronic dysthymia and anxiety Especially with her disability Will continue the lexapro

## 2017-09-10 NOTE — Telephone Encounter (Signed)
Copied from [place] [phone]. Topic: Inquiry >> Sep 10, 2017  7:28 AM Pricilla Handler wrote: Reason for CRM: Erasmo Downer with [facility] [phone]) called requesting verbal approvals for home health orders. Erasmo Downer has requested PT Twice a week for 9 weeks for the patient. Please call Erasmo Downer at [phone].       Thank You!!!

## 2017-09-10 NOTE — Assessment & Plan Note (Signed)
I have personally reviewed the Medicare Annual Wellness questionnaire and have noted 1. The patient's medical and social history 2. Their use of alcohol, tobacco or illicit drugs 3. Their current medications and supplements 4. The patient's functional ability including ADL's, fall risks, home safety risks and hearing or visual             impairment. 5. Diet and physical activities 6. Evidence for depression or mood disorders  The patients weight, height, BMI and visual acuity have been recorded in the chart I have made referrals, counseling and provided education to the patient based review of the above and I have provided the pt with a written personalized care plan for preventive services.  I have provided you with a copy of your personalized plan for preventive services. Please take the time to review along with your updated medication list.  Yearly flu vaccine Can't exercise No cancer screening

## 2017-09-10 NOTE — Assessment & Plan Note (Signed)
She doesn't want to consider vascular evaluation

## 2017-09-10 NOTE — Assessment & Plan Note (Signed)
Has DNR 

## 2017-09-13 DIAGNOSIS — R0609 Other forms of dyspnea: Secondary | ICD-10-CM | POA: Diagnosis not present

## 2017-09-13 DIAGNOSIS — I5020 Unspecified systolic (congestive) heart failure: Secondary | ICD-10-CM | POA: Diagnosis not present

## 2017-09-13 DIAGNOSIS — R0602 Shortness of breath: Secondary | ICD-10-CM | POA: Diagnosis not present

## 2017-09-13 DIAGNOSIS — I872 Venous insufficiency (chronic) (peripheral): Secondary | ICD-10-CM | POA: Diagnosis not present

## 2017-09-13 DIAGNOSIS — I5042 Chronic combined systolic (congestive) and diastolic (congestive) heart failure: Secondary | ICD-10-CM | POA: Diagnosis not present

## 2017-09-14 ENCOUNTER — Telehealth: Payer: Self-pay | Admitting: Internal Medicine

## 2017-09-14 DIAGNOSIS — Z7982 Long term (current) use of aspirin: Secondary | ICD-10-CM | POA: Diagnosis not present

## 2017-09-14 DIAGNOSIS — M1991 Primary osteoarthritis, unspecified site: Secondary | ICD-10-CM | POA: Diagnosis not present

## 2017-09-14 DIAGNOSIS — G9009 Other idiopathic peripheral autonomic neuropathy: Secondary | ICD-10-CM | POA: Diagnosis not present

## 2017-09-14 DIAGNOSIS — Z9181 History of falling: Secondary | ICD-10-CM | POA: Diagnosis not present

## 2017-09-14 DIAGNOSIS — G8929 Other chronic pain: Secondary | ICD-10-CM | POA: Diagnosis not present

## 2017-09-14 DIAGNOSIS — I509 Heart failure, unspecified: Secondary | ICD-10-CM | POA: Diagnosis not present

## 2017-09-14 NOTE — Telephone Encounter (Signed)
Copied from [place] [phone]. Topic: Quick Communication - See Telephone Encounter >> Sep 14, 2017  3:04 PM Bea Graff, NT wrote: CRM for notification. See Telephone encounter for: Tillie Rung with [facility] needing verbal orders for home health for this pt. 1 time a week for 1 week for re-certification, 2 times a week for 8 weeks after this week for disease management and education. Also home health aide services for twice a week for 8 weeks. CB#: [phone]  09/14/17.

## 2017-09-14 NOTE — Telephone Encounter (Signed)
Verbal orders left for [facility] on VM

## 2017-09-14 NOTE — Telephone Encounter (Signed)
[place] for verbal orders for requested services

## 2017-09-16 DIAGNOSIS — I509 Heart failure, unspecified: Secondary | ICD-10-CM | POA: Diagnosis not present

## 2017-09-16 DIAGNOSIS — G9009 Other idiopathic peripheral autonomic neuropathy: Secondary | ICD-10-CM | POA: Diagnosis not present

## 2017-09-16 DIAGNOSIS — M1991 Primary osteoarthritis, unspecified site: Secondary | ICD-10-CM | POA: Diagnosis not present

## 2017-09-16 DIAGNOSIS — Z9181 History of falling: Secondary | ICD-10-CM | POA: Diagnosis not present

## 2017-09-16 DIAGNOSIS — Z7982 Long term (current) use of aspirin: Secondary | ICD-10-CM | POA: Diagnosis not present

## 2017-09-16 DIAGNOSIS — G8929 Other chronic pain: Secondary | ICD-10-CM | POA: Diagnosis not present

## 2017-09-17 ENCOUNTER — Telehealth: Payer: Self-pay | Admitting: Internal Medicine

## 2017-09-17 NOTE — Telephone Encounter (Signed)
Copied from [place]. Topic: Quick Communication - See Telephone Encounter >> Sep 17, 2017 12:54 PM Cleaster Corin, NT wrote: CRM for notification. See Telephone encounter for:   09/17/17. Ms.Haley Harris calling [facility] to get verbal orders for pt. Skilled nursing, disease and med. Management, at home diet teaching. wound care for heel normal saline and wound cleanser   2 week 8  Also requesting home health aide for bath  2 week 8  K. Wynetta Harris can be reached at [phone] to leave vm)

## 2017-09-17 NOTE — Telephone Encounter (Signed)
Dr Letvak out of office;Please advise.  

## 2017-09-17 NOTE — Telephone Encounter (Signed)
Spoke with Haley Harris informing her Dr. Darnell Level agrees to orders in Dr. Alla German absence.  Says ok.

## 2017-09-17 NOTE — Telephone Encounter (Signed)
Agree with this. Thanks.  

## 2017-09-20 ENCOUNTER — Telehealth: Payer: Self-pay

## 2017-09-20 DIAGNOSIS — Z9181 History of falling: Secondary | ICD-10-CM | POA: Diagnosis not present

## 2017-09-20 DIAGNOSIS — M1991 Primary osteoarthritis, unspecified site: Secondary | ICD-10-CM | POA: Diagnosis not present

## 2017-09-20 DIAGNOSIS — G8929 Other chronic pain: Secondary | ICD-10-CM | POA: Diagnosis not present

## 2017-09-20 DIAGNOSIS — G9009 Other idiopathic peripheral autonomic neuropathy: Secondary | ICD-10-CM | POA: Diagnosis not present

## 2017-09-20 DIAGNOSIS — Z7982 Long term (current) use of aspirin: Secondary | ICD-10-CM | POA: Diagnosis not present

## 2017-09-20 DIAGNOSIS — I509 Heart failure, unspecified: Secondary | ICD-10-CM | POA: Diagnosis not present

## 2017-09-20 DIAGNOSIS — L89512 Pressure ulcer of right ankle, stage 2: Secondary | ICD-10-CM | POA: Diagnosis not present

## 2017-09-20 NOTE — Telephone Encounter (Signed)
Left message to call office.  PEC: If they call back, please advise them of Dr Alla German message below and let me know what they say. Thanks

## 2017-09-20 NOTE — Telephone Encounter (Signed)
Please give me some specifics about how much time, etc is required. If she just had to make an appearance and leave, she could probably do that--if she would need to stay for extended time, I can excuse her medically from that

## 2017-09-20 NOTE — Telephone Encounter (Signed)
Copied from [place] [id number]. Topic: General - Other >> Sep 20, 2017  3:52 PM Cecelia Byars, NT wrote: Reason for CRM: Patients daughter called and would like a letter stating that the patient is unable to attend to court for bankrupty court and would like come by and and pick up the letter and would likte this to include  a medical summary of her medical condition also ,please call her at [phone] or [phone]

## 2017-09-21 ENCOUNTER — Encounter: Payer: Self-pay | Admitting: Internal Medicine

## 2017-09-21 DIAGNOSIS — G8929 Other chronic pain: Secondary | ICD-10-CM | POA: Diagnosis not present

## 2017-09-21 DIAGNOSIS — I509 Heart failure, unspecified: Secondary | ICD-10-CM | POA: Diagnosis not present

## 2017-09-21 DIAGNOSIS — L89512 Pressure ulcer of right ankle, stage 2: Secondary | ICD-10-CM | POA: Diagnosis not present

## 2017-09-21 DIAGNOSIS — Z9181 History of falling: Secondary | ICD-10-CM | POA: Diagnosis not present

## 2017-09-21 DIAGNOSIS — G9009 Other idiopathic peripheral autonomic neuropathy: Secondary | ICD-10-CM | POA: Diagnosis not present

## 2017-09-21 DIAGNOSIS — M1991 Primary osteoarthritis, unspecified site: Secondary | ICD-10-CM | POA: Diagnosis not present

## 2017-09-21 DIAGNOSIS — Z0279 Encounter for issue of other medical certificate: Secondary | ICD-10-CM

## 2017-09-21 DIAGNOSIS — Z7982 Long term (current) use of aspirin: Secondary | ICD-10-CM | POA: Diagnosis not present

## 2017-09-21 NOTE — Telephone Encounter (Signed)
Letter put up front for pick up.

## 2017-09-21 NOTE — Telephone Encounter (Signed)
Patients Daughter said she would have to stay and swear herself in and everything. Also, she said there is no where for her to park right in front and walk in.

## 2017-09-21 NOTE — Telephone Encounter (Signed)
Letter done $20 charge Daughter or someone coming to pick it up soon

## 2017-09-22 DIAGNOSIS — I5020 Unspecified systolic (congestive) heart failure: Secondary | ICD-10-CM | POA: Diagnosis not present

## 2017-09-23 DIAGNOSIS — Z7982 Long term (current) use of aspirin: Secondary | ICD-10-CM | POA: Diagnosis not present

## 2017-09-23 DIAGNOSIS — G9009 Other idiopathic peripheral autonomic neuropathy: Secondary | ICD-10-CM | POA: Diagnosis not present

## 2017-09-23 DIAGNOSIS — M1991 Primary osteoarthritis, unspecified site: Secondary | ICD-10-CM | POA: Diagnosis not present

## 2017-09-23 DIAGNOSIS — Z9181 History of falling: Secondary | ICD-10-CM | POA: Diagnosis not present

## 2017-09-23 DIAGNOSIS — I509 Heart failure, unspecified: Secondary | ICD-10-CM | POA: Diagnosis not present

## 2017-09-23 DIAGNOSIS — L89512 Pressure ulcer of right ankle, stage 2: Secondary | ICD-10-CM | POA: Diagnosis not present

## 2017-09-23 DIAGNOSIS — G8929 Other chronic pain: Secondary | ICD-10-CM | POA: Diagnosis not present

## 2017-09-24 DIAGNOSIS — I509 Heart failure, unspecified: Secondary | ICD-10-CM | POA: Diagnosis not present

## 2017-09-24 DIAGNOSIS — L89512 Pressure ulcer of right ankle, stage 2: Secondary | ICD-10-CM | POA: Diagnosis not present

## 2017-09-24 DIAGNOSIS — M1991 Primary osteoarthritis, unspecified site: Secondary | ICD-10-CM | POA: Diagnosis not present

## 2017-09-24 DIAGNOSIS — G9009 Other idiopathic peripheral autonomic neuropathy: Secondary | ICD-10-CM | POA: Diagnosis not present

## 2017-09-24 DIAGNOSIS — G8929 Other chronic pain: Secondary | ICD-10-CM | POA: Diagnosis not present

## 2017-09-24 DIAGNOSIS — Z7982 Long term (current) use of aspirin: Secondary | ICD-10-CM | POA: Diagnosis not present

## 2017-09-24 DIAGNOSIS — Z9181 History of falling: Secondary | ICD-10-CM | POA: Diagnosis not present

## 2017-09-25 ENCOUNTER — Other Ambulatory Visit: Payer: Self-pay | Admitting: Internal Medicine

## 2017-09-27 ENCOUNTER — Telehealth: Payer: Self-pay | Admitting: Internal Medicine

## 2017-09-27 NOTE — Telephone Encounter (Signed)
Copied from [place]. Topic: Quick Communication - See Telephone Encounter >> Sep 27, 2017  2:47 PM Boyd Kerbs wrote: CRM for notification. See Telephone encounter for:   Marval Regal, daughter, [phone]  pt. Is not wanting in home care anymore.  She is asking for Hospice - needing asap.    09/27/17.

## 2017-09-27 NOTE — Telephone Encounter (Signed)
Did something worsen since last visit? While something can happen quickly to those with CHF, I didn't think she was within 6 months of the end. Hospice will only provide aides 2-3 times a week to help with a bath--you would still need to hire for any other personal assistance Please clarify what is going on

## 2017-09-27 NOTE — Telephone Encounter (Signed)
Called and left a message for [place] to let her know about the referral.

## 2017-09-27 NOTE — Telephone Encounter (Signed)
Spoke to daughter, [name]. She said her mother is giving up. She has asked everyone to come visit so she can tell them goodbye. Her pain is worsening daily. She is coughing more and struggles to recover her oxygen when she stops coughing. She has no appetite. If they do not make her get up and go to the bathroom, she would do it where she is.

## 2017-09-27 NOTE — Telephone Encounter (Signed)
Haley Harris, let the daughter know that I sent the referral and they should hear soon.  Haley Harris, Please send my last 2 notes and demographics to [place] thanks

## 2017-09-28 DIAGNOSIS — L89512 Pressure ulcer of right ankle, stage 2: Secondary | ICD-10-CM | POA: Diagnosis not present

## 2017-09-28 DIAGNOSIS — Z9181 History of falling: Secondary | ICD-10-CM | POA: Diagnosis not present

## 2017-09-28 DIAGNOSIS — I509 Heart failure, unspecified: Secondary | ICD-10-CM | POA: Diagnosis not present

## 2017-09-28 DIAGNOSIS — G9009 Other idiopathic peripheral autonomic neuropathy: Secondary | ICD-10-CM | POA: Diagnosis not present

## 2017-09-28 DIAGNOSIS — G8929 Other chronic pain: Secondary | ICD-10-CM | POA: Diagnosis not present

## 2017-09-28 DIAGNOSIS — M1991 Primary osteoarthritis, unspecified site: Secondary | ICD-10-CM | POA: Diagnosis not present

## 2017-09-28 DIAGNOSIS — Z7982 Long term (current) use of aspirin: Secondary | ICD-10-CM | POA: Diagnosis not present

## 2017-09-30 ENCOUNTER — Ambulatory Visit (INDEPENDENT_AMBULATORY_CARE_PROVIDER_SITE_OTHER): Payer: Medicare Other | Admitting: Specialist

## 2017-09-30 ENCOUNTER — Other Ambulatory Visit: Payer: Self-pay | Admitting: Internal Medicine

## 2017-09-30 MED ORDER — MORPHINE SULFATE (CONCENTRATE) 10 MG /0.5 ML PO SOLN: 5.0000 mg | ORAL | 0 refills | Status: DC | PRN

## 2017-09-30 NOTE — Progress Notes (Signed)
Order faxed letting them know Rx was sent

## 2017-09-30 NOTE — Progress Notes (Signed)
Please let them know the Rx was sent

## 2017-10-11 DIAGNOSIS — N183 Chronic kidney disease, stage 3 (moderate): Secondary | ICD-10-CM | POA: Diagnosis not present

## 2017-10-11 DIAGNOSIS — I130 Hypertensive heart and chronic kidney disease with heart failure and stage 1 through stage 4 chronic kidney disease, or unspecified chronic kidney disease: Secondary | ICD-10-CM | POA: Diagnosis not present

## 2017-10-11 DIAGNOSIS — I5042 Chronic combined systolic (congestive) and diastolic (congestive) heart failure: Secondary | ICD-10-CM | POA: Diagnosis not present

## 2017-10-11 DIAGNOSIS — I25118 Atherosclerotic heart disease of native coronary artery with other forms of angina pectoris: Secondary | ICD-10-CM | POA: Diagnosis not present

## 2017-10-18 ENCOUNTER — Telehealth: Payer: Self-pay | Admitting: Internal Medicine

## 2017-10-18 ENCOUNTER — Other Ambulatory Visit: Payer: Self-pay | Admitting: Family Medicine

## 2017-10-18 ENCOUNTER — Ambulatory Visit: Payer: Medicare Other | Admitting: Podiatry

## 2017-10-18 MED ORDER — MORPHINE SULFATE (CONCENTRATE) 10 MG /0.5 ML PO SOLN: 5.0000 mg | ORAL | 0 refills | Status: DC | PRN

## 2017-10-18 NOTE — Telephone Encounter (Signed)
Refilled for hospice given Dr. Alla German absence.

## 2017-10-18 NOTE — Telephone Encounter (Signed)
See note from Hospice RN. Requesting Morphine Concentrate 10 MG/0.5 ML PCP Dr. Silvio Pate LOV 09/10/2017 [facility] [phone]

## 2017-10-18 NOTE — Telephone Encounter (Signed)
Copied from [place]. Topic: Quick Communication - See Telephone Encounter >> Oct 18, 2017  8:33 AM Ahmed Prima L wrote: CRM for notification. See Telephone encounter for: 10/18/17.  Nicole Kindred RN from [facility] called and said that she needs a refill on Morphine Sulfate (MORPHINE CONCENTRATE) 10 mg / 0.5 ml concentrated solution  [id number] [place], [place]

## 2017-10-18 NOTE — Telephone Encounter (Signed)
Dr Silvio Pate is out of the office. Will forward to Dr Lorelei Pont in his absence.

## 2017-10-22 ENCOUNTER — Telehealth: Payer: Self-pay | Admitting: Internal Medicine

## 2017-10-22 NOTE — Telephone Encounter (Signed)
Left detailed message for Haley Harris with orders

## 2017-10-22 NOTE — Telephone Encounter (Signed)
Tried to call pt's daughter [name]. Dr Silvio Pate wanted to know if he needed to make a home visit.

## 2017-10-22 NOTE — Telephone Encounter (Signed)
Okay to place foley if needed UNNA boots can be used if edema is causing a hard time---I would change them weekly

## 2017-10-22 NOTE — Telephone Encounter (Signed)
Copied from [place] [phone]. Topic: Inquiry >> Oct 22, 2017  9:40 AM Scherrie Gerlach wrote: Reason for CRM: Nicole Kindred with [facility] needs verbal to place foley catheter and put on UNo boots asap. Also needs frequency how how long to place uno boots.  Nicole Kindred is on the way now so would like verbal asap

## 2017-10-25 ENCOUNTER — Telehealth: Payer: Self-pay | Admitting: Internal Medicine

## 2017-10-25 MED ORDER — MORPHINE SULFATE (CONCENTRATE) 10 MG /0.5 ML PO SOLN: 20.0000 mg | ORAL | 0 refills | Status: AC | PRN

## 2017-11-10 NOTE — Telephone Encounter (Signed)
Copied from [place] [phone]. Topic: General - Deceased Patient >> 2017-11-06  1:37 PM Synthia Innocent wrote: Reason for CRM: Patient passed on 06-Nov-2017 @12 :40pm  Route to department's PEC Pool.

## 2017-11-10 NOTE — Telephone Encounter (Signed)
Left D/C note on on VM at pharmacy

## 2017-11-10 NOTE — Telephone Encounter (Signed)
Pt has passed away.  

## 2017-11-10 NOTE — Telephone Encounter (Signed)
I only just found this out. Cancel the prescription

## 2017-11-10 NOTE — Telephone Encounter (Signed)
Copied from [place] [id number]. Topic: Quick Communication - Rx Refill/Question >> 2017-11-11  9:10 AM Scherrie Gerlach wrote: Medication:  Morphine Sulfate (MORPHINE CONCENTRATE) 10 mg / 0.5 ml concentrated solution  Vivien Rota states this med had to be increased to 0.5 ml to 1 ml every hour as needed. So they need new Rx [id number] [place], [place] [phone] (Phone) [phone] (Fax)

## 2017-11-10 NOTE — Telephone Encounter (Signed)
Dolly with [facility] [place] called to clarify order for morphine. Per most recent TE pt has passed away. [facility]   [id number] [address], [place] [phone] (Phone) [phone] (Fax)

## 2017-11-10 NOTE — Telephone Encounter (Signed)
Spoke to her daughter and offered condolences

## 2017-11-10 DEATH — deceased

## 2018-03-16 ENCOUNTER — Ambulatory Visit: Payer: Medicare Other | Admitting: Internal Medicine

## 2018-11-15 ENCOUNTER — Other Ambulatory Visit: Payer: Self-pay | Admitting: *Deleted

## 2018-11-15 NOTE — Patient Outreach (Signed)
[facility]) Care Management  11/15/2018  [name] 1928-09-13 [id number]  Late entry: [place] CM case closure/ documentation only  Patient designated not active with [facility] CM;  [facility] CM previous cases re-closed today as instructed  Oneta Rack, RN, BSN, [place] Coordinator [facility] Care Management  [phone]
